# Patient Record
Sex: Female | Born: 1950
Health system: Southern US, Community
[De-identification: ages and names within clinical notes are randomized; demographics above are authoritative.]

## PROBLEM LIST (undated history)

## (undated) DIAGNOSIS — R011 Cardiac murmur, unspecified: Secondary | ICD-10-CM

## (undated) DIAGNOSIS — E079 Disorder of thyroid, unspecified: Secondary | ICD-10-CM

## (undated) DIAGNOSIS — IMO0002 Reserved for concepts with insufficient information to code with codable children: Secondary | ICD-10-CM

## (undated) DIAGNOSIS — E039 Hypothyroidism, unspecified: Secondary | ICD-10-CM

## (undated) DIAGNOSIS — E119 Type 2 diabetes mellitus without complications: Secondary | ICD-10-CM

## (undated) DIAGNOSIS — G709 Myoneural disorder, unspecified: Secondary | ICD-10-CM

## (undated) DIAGNOSIS — F32A Depression, unspecified: Secondary | ICD-10-CM

## (undated) DIAGNOSIS — I10 Essential (primary) hypertension: Secondary | ICD-10-CM

## (undated) DIAGNOSIS — C801 Malignant (primary) neoplasm, unspecified: Secondary | ICD-10-CM

## (undated) DIAGNOSIS — T8859XA Other complications of anesthesia, initial encounter: Secondary | ICD-10-CM

## (undated) DIAGNOSIS — F419 Anxiety disorder, unspecified: Secondary | ICD-10-CM

## (undated) DIAGNOSIS — T7840XA Allergy, unspecified, initial encounter: Secondary | ICD-10-CM

## (undated) DIAGNOSIS — J189 Pneumonia, unspecified organism: Secondary | ICD-10-CM

## (undated) DIAGNOSIS — F329 Major depressive disorder, single episode, unspecified: Secondary | ICD-10-CM

## (undated) HISTORY — PX: NASAL SEPTUM SURGERY: SHX37

## (undated) HISTORY — DX: Anxiety disorder, unspecified: F41.9

## (undated) HISTORY — DX: Disorder of thyroid, unspecified: E07.9

## (undated) HISTORY — DX: Reserved for concepts with insufficient information to code with codable children: IMO0002

## (undated) HISTORY — DX: Major depressive disorder, single episode, unspecified: F32.9

## (undated) HISTORY — DX: Allergy, unspecified, initial encounter: T78.40XA

## (undated) HISTORY — PX: BREAST SURGERY: SHX581

## (undated) HISTORY — PX: CHOLECYSTECTOMY: SHX55

## (undated) HISTORY — DX: Depression, unspecified: F32.A

## (undated) HISTORY — DX: Cardiac murmur, unspecified: R01.1

## (undated) MED FILL — Fosaprepitant Dimeglumine For IV Infusion 150 MG (Base Eq): INTRAVENOUS | Qty: 5 | Status: AC

---

## 1978-01-26 HISTORY — PX: CHOLECYSTECTOMY: SHX55

## 1997-07-09 ENCOUNTER — Emergency Department (HOSPITAL_COMMUNITY): Admission: EM | Admit: 1997-07-09 | Discharge: 1997-07-09 | Payer: Self-pay | Admitting: Emergency Medicine

## 1997-07-17 ENCOUNTER — Emergency Department (HOSPITAL_COMMUNITY): Admission: EM | Admit: 1997-07-17 | Discharge: 1997-07-18 | Payer: Self-pay | Admitting: Internal Medicine

## 1997-07-17 ENCOUNTER — Emergency Department (HOSPITAL_COMMUNITY): Admission: EM | Admit: 1997-07-17 | Discharge: 1997-07-17 | Payer: Self-pay

## 1997-11-13 ENCOUNTER — Emergency Department (HOSPITAL_COMMUNITY): Admission: EM | Admit: 1997-11-13 | Discharge: 1997-11-13 | Payer: Self-pay | Admitting: Emergency Medicine

## 1998-03-14 ENCOUNTER — Emergency Department (HOSPITAL_COMMUNITY): Admission: EM | Admit: 1998-03-14 | Discharge: 1998-03-14 | Payer: Self-pay | Admitting: Emergency Medicine

## 1998-03-20 ENCOUNTER — Ambulatory Visit (HOSPITAL_COMMUNITY): Admission: RE | Admit: 1998-03-20 | Discharge: 1998-03-20 | Payer: Self-pay | Admitting: *Deleted

## 1998-03-20 ENCOUNTER — Encounter: Payer: Self-pay | Admitting: *Deleted

## 1998-04-24 ENCOUNTER — Encounter: Payer: Self-pay | Admitting: *Deleted

## 1998-04-24 ENCOUNTER — Ambulatory Visit (HOSPITAL_COMMUNITY): Admission: RE | Admit: 1998-04-24 | Discharge: 1998-04-24 | Payer: Self-pay | Admitting: *Deleted

## 1998-04-27 ENCOUNTER — Emergency Department (HOSPITAL_COMMUNITY): Admission: EM | Admit: 1998-04-27 | Discharge: 1998-04-27 | Payer: Self-pay | Admitting: Emergency Medicine

## 1998-06-18 ENCOUNTER — Encounter: Payer: Self-pay | Admitting: *Deleted

## 1998-06-18 ENCOUNTER — Ambulatory Visit (HOSPITAL_COMMUNITY): Admission: RE | Admit: 1998-06-18 | Discharge: 1998-06-18 | Payer: Self-pay | Admitting: *Deleted

## 1998-06-27 ENCOUNTER — Ambulatory Visit (HOSPITAL_COMMUNITY): Admission: RE | Admit: 1998-06-27 | Discharge: 1998-06-27 | Payer: Self-pay | Admitting: *Deleted

## 1998-06-27 ENCOUNTER — Encounter: Payer: Self-pay | Admitting: *Deleted

## 1998-07-10 ENCOUNTER — Encounter: Payer: Self-pay | Admitting: *Deleted

## 1998-07-10 ENCOUNTER — Ambulatory Visit (HOSPITAL_COMMUNITY): Admission: RE | Admit: 1998-07-10 | Discharge: 1998-07-10 | Payer: Self-pay | Admitting: *Deleted

## 1998-07-24 ENCOUNTER — Ambulatory Visit (HOSPITAL_COMMUNITY): Admission: RE | Admit: 1998-07-24 | Discharge: 1998-07-24 | Payer: Self-pay | Admitting: *Deleted

## 1998-08-13 ENCOUNTER — Encounter: Payer: Self-pay | Admitting: *Deleted

## 1998-08-13 ENCOUNTER — Ambulatory Visit (HOSPITAL_COMMUNITY): Admission: RE | Admit: 1998-08-13 | Discharge: 1998-08-13 | Payer: Self-pay | Admitting: *Deleted

## 1998-10-09 ENCOUNTER — Encounter: Payer: Self-pay | Admitting: *Deleted

## 1998-10-09 ENCOUNTER — Ambulatory Visit (HOSPITAL_COMMUNITY): Admission: RE | Admit: 1998-10-09 | Discharge: 1998-10-09 | Payer: Self-pay | Admitting: *Deleted

## 1998-11-19 ENCOUNTER — Emergency Department (HOSPITAL_COMMUNITY): Admission: EM | Admit: 1998-11-19 | Discharge: 1998-11-19 | Payer: Self-pay | Admitting: Emergency Medicine

## 1998-11-28 ENCOUNTER — Encounter: Payer: Self-pay | Admitting: *Deleted

## 1998-11-28 ENCOUNTER — Ambulatory Visit (HOSPITAL_COMMUNITY): Admission: RE | Admit: 1998-11-28 | Discharge: 1998-11-28 | Payer: Self-pay | Admitting: *Deleted

## 1999-01-24 ENCOUNTER — Ambulatory Visit (HOSPITAL_COMMUNITY): Admission: RE | Admit: 1999-01-24 | Discharge: 1999-01-24 | Payer: Self-pay | Admitting: *Deleted

## 1999-01-24 ENCOUNTER — Encounter: Payer: Self-pay | Admitting: *Deleted

## 1999-03-14 ENCOUNTER — Encounter: Payer: Self-pay | Admitting: Emergency Medicine

## 1999-03-14 ENCOUNTER — Emergency Department (HOSPITAL_COMMUNITY): Admission: EM | Admit: 1999-03-14 | Discharge: 1999-03-14 | Payer: Self-pay | Admitting: Emergency Medicine

## 1999-04-16 ENCOUNTER — Encounter: Payer: Self-pay | Admitting: *Deleted

## 1999-04-16 ENCOUNTER — Ambulatory Visit (HOSPITAL_COMMUNITY): Admission: RE | Admit: 1999-04-16 | Discharge: 1999-04-16 | Payer: Self-pay | Admitting: *Deleted

## 1999-06-27 ENCOUNTER — Ambulatory Visit (HOSPITAL_COMMUNITY): Admission: RE | Admit: 1999-06-27 | Discharge: 1999-06-27 | Payer: Self-pay | Admitting: *Deleted

## 1999-06-27 ENCOUNTER — Encounter: Payer: Self-pay | Admitting: *Deleted

## 1999-09-10 ENCOUNTER — Ambulatory Visit (HOSPITAL_COMMUNITY): Admission: RE | Admit: 1999-09-10 | Discharge: 1999-09-10 | Payer: Self-pay | Admitting: *Deleted

## 1999-09-10 ENCOUNTER — Encounter: Payer: Self-pay | Admitting: *Deleted

## 2000-04-22 ENCOUNTER — Ambulatory Visit (HOSPITAL_COMMUNITY): Admission: RE | Admit: 2000-04-22 | Discharge: 2000-04-22 | Payer: Self-pay | Admitting: *Deleted

## 2000-04-22 ENCOUNTER — Encounter: Payer: Self-pay | Admitting: *Deleted

## 2000-12-03 ENCOUNTER — Encounter: Admission: RE | Admit: 2000-12-03 | Discharge: 2000-12-03 | Payer: Self-pay | Admitting: Internal Medicine

## 2000-12-03 ENCOUNTER — Encounter: Payer: Self-pay | Admitting: Internal Medicine

## 2001-12-24 ENCOUNTER — Emergency Department (HOSPITAL_COMMUNITY): Admission: EM | Admit: 2001-12-24 | Discharge: 2001-12-24 | Payer: Self-pay

## 2002-12-14 ENCOUNTER — Ambulatory Visit (HOSPITAL_COMMUNITY): Admission: RE | Admit: 2002-12-14 | Discharge: 2002-12-14 | Payer: Self-pay | Admitting: *Deleted

## 2002-12-17 ENCOUNTER — Ambulatory Visit (HOSPITAL_COMMUNITY): Admission: RE | Admit: 2002-12-17 | Discharge: 2002-12-17 | Payer: Self-pay | Admitting: *Deleted

## 2005-08-13 ENCOUNTER — Emergency Department (HOSPITAL_COMMUNITY): Admission: EM | Admit: 2005-08-13 | Discharge: 2005-08-13 | Payer: Self-pay | Admitting: Emergency Medicine

## 2007-01-09 ENCOUNTER — Emergency Department (HOSPITAL_COMMUNITY): Admission: EM | Admit: 2007-01-09 | Discharge: 2007-01-09 | Payer: Self-pay | Admitting: Emergency Medicine

## 2007-02-02 ENCOUNTER — Ambulatory Visit: Payer: Self-pay | Admitting: Internal Medicine

## 2007-03-01 ENCOUNTER — Ambulatory Visit: Payer: Self-pay | Admitting: Internal Medicine

## 2007-03-01 ENCOUNTER — Encounter: Payer: Self-pay | Admitting: Internal Medicine

## 2008-07-20 ENCOUNTER — Emergency Department (HOSPITAL_BASED_OUTPATIENT_CLINIC_OR_DEPARTMENT_OTHER): Admission: EM | Admit: 2008-07-20 | Discharge: 2008-07-20 | Payer: Self-pay | Admitting: Emergency Medicine

## 2008-07-20 ENCOUNTER — Ambulatory Visit: Payer: Self-pay | Admitting: Diagnostic Radiology

## 2008-10-20 ENCOUNTER — Emergency Department (HOSPITAL_COMMUNITY): Admission: EM | Admit: 2008-10-20 | Discharge: 2008-10-20 | Payer: Self-pay | Admitting: Emergency Medicine

## 2009-07-15 ENCOUNTER — Ambulatory Visit (HOSPITAL_COMMUNITY): Admission: RE | Admit: 2009-07-15 | Discharge: 2009-07-15 | Payer: Self-pay | Admitting: Psychiatry

## 2010-02-25 ENCOUNTER — Encounter: Payer: Self-pay | Admitting: Internal Medicine

## 2010-03-05 NOTE — Letter (Signed)
Summary: Colonoscopy Letter  [facility]  [address] [place], [place] [address]   Phone: [phone]  Fax: [phone]      February 25, 2010 MRN: [id number]   [facility] [address] [place], [place]  [address]   Dear Jasmine Long,   According to your medical record, it is time for you to schedule a Colonoscopy. The American Cancer Society recommends this procedure as a method to detect early colon cancer. Patients with a family history of colon cancer, or a personal history of colon polyps or inflammatory bowel disease are at increased risk.  This letter has been generated based on the recommendations made at the time of your procedure. If you feel that in your particular situation this may no longer apply, please contact our office.  Please call our office at [phone] to schedule this appointment or to update your records at your earliest convenience.  Thank you for cooperating with [place] to provide you with the very best care possible.   Sincerely,  Wilhemina Bonito. Marina Goodell, M.D.  [facility] [phone]

## 2010-05-02 LAB — POCT CARDIAC MARKERS
CKMB, poc: <1 ng/mL — ABNORMAL LOW (ref 1.0–8.0)
Myoglobin, poc: 49.5 ng/mL (ref 12–200)
Troponin i, poc: <0.05 ng/mL (ref 0.00–0.09)

## 2010-05-02 LAB — POCT I-STAT, CHEM 8
BUN: 16 mg/dL (ref 6–23)
Calcium, Ion: 1.15 mmol/L (ref 1.12–1.32)
Chloride: 106 mEq/L (ref 96–112)
Creatinine, Ser: 0.8 mg/dL (ref 0.4–1.2)
Glucose, Bld: 138 mg/dL — ABNORMAL HIGH (ref 70–99)
HCT: 45 % (ref 36.0–46.0)
Hemoglobin: 15.3 g/dL — ABNORMAL HIGH (ref 12.0–15.0)
Potassium: 4 mEq/L (ref 3.5–5.1)
Sodium: 139 mEq/L (ref 135–145)
TCO2: 24 mmol/L (ref 0–100)

## 2010-05-02 LAB — D-DIMER, QUANTITATIVE: D-Dimer, Quant: 0.97 ug/mL-FEU — ABNORMAL HIGH (ref 0.00–0.48)

## 2010-05-02 LAB — CK TOTAL AND CKMB (NOT AT ARMC)
CK, MB: 1.2 ng/mL (ref 0.3–4.0)
Relative Index: INVALID (ref 0.0–2.5)
Total CK: 60 U/L (ref 7–177)

## 2010-05-02 LAB — TROPONIN I: Troponin I: 0.01 ng/mL (ref 0.00–0.06)

## 2010-05-05 LAB — URINALYSIS, ROUTINE W REFLEX MICROSCOPIC
Bilirubin Urine: NEGATIVE
Glucose, UA: NEGATIVE mg/dL
Ketones, ur: NEGATIVE mg/dL
Leukocytes, UA: NEGATIVE
Nitrite: NEGATIVE
Protein, ur: NEGATIVE mg/dL
Specific Gravity, Urine: 1.004 — ABNORMAL LOW (ref 1.005–1.030)
Urobilinogen, UA: 0.2 mg/dL (ref 0.0–1.0)
pH: 6 (ref 5.0–8.0)

## 2010-05-05 LAB — DIFFERENTIAL
Basophils Absolute: 0.2 10*3/uL — ABNORMAL HIGH (ref 0.0–0.1)
Basophils Relative: 2 % — ABNORMAL HIGH (ref 0–1)
Eosinophils Absolute: 0.1 10*3/uL (ref 0.0–0.7)
Eosinophils Relative: 1 % (ref 0–5)
Lymphocytes Relative: 23 % (ref 12–46)
Lymphs Abs: 2.7 10*3/uL (ref 0.7–4.0)
Monocytes Absolute: 0.6 10*3/uL (ref 0.1–1.0)
Monocytes Relative: 5 % (ref 3–12)
Neutro Abs: 7.9 10*3/uL — ABNORMAL HIGH (ref 1.7–7.7)
Neutrophils Relative %: 69 % (ref 43–77)

## 2010-05-05 LAB — COMPREHENSIVE METABOLIC PANEL
ALT: 16 U/L (ref 0–35)
AST: 24 U/L (ref 0–37)
Albumin: 4.5 g/dL (ref 3.5–5.2)
Alkaline Phosphatase: 110 U/L (ref 39–117)
BUN: 12 mg/dL (ref 6–23)
CO2: 25 mEq/L (ref 19–32)
Calcium: 9.4 mg/dL (ref 8.4–10.5)
Chloride: 107 mEq/L (ref 96–112)
Creatinine, Ser: 0.7 mg/dL (ref 0.4–1.2)
GFR calc Af Amer: >60 mL/min (ref >60)
GFR calc non Af Amer: >60 mL/min (ref >60)
Glucose, Bld: 103 mg/dL — ABNORMAL HIGH (ref 70–99)
Potassium: 4.1 mEq/L (ref 3.5–5.1)
Sodium: 141 mEq/L (ref 135–145)
Total Bilirubin: 0.5 mg/dL (ref 0.3–1.2)
Total Protein: 8.2 g/dL (ref 6.0–8.3)

## 2010-05-05 LAB — CBC
HCT: 45.1 % (ref 36.0–46.0)
Hemoglobin: 15.5 g/dL — ABNORMAL HIGH (ref 12.0–15.0)
MCHC: 34.3 g/dL (ref 30.0–36.0)
MCV: 90.4 fL (ref 78.0–100.0)
Platelets: 315 10*3/uL (ref 150–400)
RBC: 4.99 MIL/uL (ref 3.87–5.11)
RDW: 13.5 % (ref 11.5–15.5)
WBC: 11.5 10*3/uL — ABNORMAL HIGH (ref 4.0–10.5)

## 2010-05-05 LAB — URINE MICROSCOPIC-ADD ON

## 2010-05-05 LAB — LIPASE, BLOOD: Lipase: 82 U/L (ref 23–300)

## 2010-06-10 NOTE — Assessment & Plan Note (Signed)
HEALTHCARE                         GASTROENTEROLOGY OFFICE NOTE   NAME:[name], [name]                           MRN:          [id number]  DATE:02/02/2007                            DOB:          Mar 12, 1950    REFERRING PHYSICIAN:  Harrel Lemon. Merla Riches, M.D.   REASON FOR CONSULTATION:  Abdominal pain and Hemoccult positive stool.   HISTORY:  This is a 60 year old white female with a history of  hypothyroidism, obesity and chronic tobacco abuse.  She is referred  through the courtesy of Dr. Merla Riches regarding abdominal pain. And  Hemoccult positive stool.  Patient was evaluated in this office on one  occasion in November 2002 to evaluate right upper quadrant pain and  occasional diarrhea.  See that dictation for details.  Patient was to  have undergone colonoscopy and upper endoscopy.  She was scheduled for  those procedures in December 2002, however, she failed to keep her  appointment stating that she forgot.  She requested to be rescheduled in  mid January.  She was rescheduled mid January 2003, however, she was a  no-show for those procedures.  At that time, she stated the problem had  to do with insurance.  She has not been seen since.  The current history  is that of epigastric and upper abdominal pain in mid December.  She  went to the emergency room and underwent an extensive work-up including  laboratories and CT scan.  No acute findings found.  She was given a GI  cocktail and stated her symptoms improved.  She subsequently went home  and developed problems with nausea and vomiting.  Some recurrent pain.  She was seen at Dr. Netta Corrigan office and found to be Hemoccult  positive.  She was placed on Protonix and Carafate.  She currently  states she is feeling better though she is having some residual  discomfort.  No nausea or vomiting at this time.  At no point was there  melena or hematochezia.  She has had no problems with appetite and has  had no weight loss.  She thinks she had an ulcer back in college.  She  continues to smoke.   PAST MEDICAL HISTORY:  1. Hypothyroidism.  2. Anxiety.  3. History of heart murmur.  4. Status post cholecystectomy in 1986.  5. Status post cyst removal from breast in 1969.  6. Status post tonsillectomy as a child.  7. Status post cesarean section x2.   ALLERGIES:  NO KNOWN DRUG ALLERGIES.   CURRENT MEDICATIONS:  1. Synthroid 0.125 mg every other day.  2. Pantoprazole 40 mg daily.  3. Carafate 1 g q.i.d.   FAMILY HISTORY:  Several second cousins on her father's side with colon  cancer.  Mother with lung cancer.  Father with prostate cancer.   SOCIAL HISTORY:  Patient is divorced with two sons.  She lives alone.  She has a BA in sociology.  She is a Production designer, theatre/television/film at [employer].  She smokes a pack of cigarettes per day.  Does not use  alcohol.   REVIEW  OF SYSTEMS:  Per diagnostic evaluation form.   PHYSICAL EXAMINATION:  VITAL SIGNS:  Blood pressure 150/90, heart rate  88.  GENERAL APPEARANCE:  Patient is alert and oriented.  She is in no acute  distress.  HEENT:  Sclerae are anicteric.  Conjunctivae are pink.  Oral mucosa is  intact.  No adenopathy.  LUNGS:  Clear.  CARDIOVASCULAR:  Regular.  ABDOMEN:  Obese and soft with complaints of tenderness to palpation in  the epigastric region.  Good bowel sounds heard.  No mass felt.  EXTREMITIES:  Without edema.   IMPRESSION:  1. Upper abdominal pain.  Seemingly improved on proton pump inhibitor      therapy.  Rule out acid peptic disorders.  Rule out transient viral      syndrome.  2. Hemoccult positive stool.  3. Questionable past history of peptic ulcer disease.  4. Family history of colon cancer in second degree relatives.   RECOMMENDATIONS:  1. Colonoscopy with polypectomy if necessary.  This to evaluate      Hemoccult positive stool as well as provide colorectal neoplasia      screening.  The nature of the  procedure as well as the risks,      benefits, and alternatives have been reviewed.  She understood and      agreed to proceed.  2. Upper endoscopy.  This to evaluate upper abdominal pain, Hemoccult      positive stool and a questionable history of ulcer disease.  Biopsy      should be taken for Helicobacter pylori.  Ashby Dawes of the procedure      as well as the risks, benefits, and alternatives have been      reviewed.  She understood and agreed to proceed.  3. Continue proton pump inhibitor therapy for time being.     Wilhemina Bonito. Marina Goodell, MD  Electronically Signed    JNP/MedQ  DD: 02/02/2007  DT: 02/02/2007  Job #: [id number]   cc:   Harrel Lemon. Merla Riches, M.D.

## 2010-06-10 NOTE — Assessment & Plan Note (Signed)
[facility] HEALTHCARE                                 ON-CALL NOTE   NAME:[name], Jasmine                             MRN:          [id number]  DATE:03/01/2007                            DOB:          Jun 12, 1950    Jasmine [name] is a 60 year old patient of Dr. Marina Goodell, who underwent  colonoscopy today. She returned home around 5 p.m. and called me around  10:30 p.m., reporting three blood clots per rectum. Pt ate supper after  returning from her procedure and has been feeling fine. She  denies any  abdominal pain, dizziness, nausea or vomiting.  It appears that the  blood clots were just isolated blood clots.  There were  actually no  bloody stools.  It was my recommendation to Jasmine [name] that we continue to  observe for further bleeding tonight.  If she continues to have bloody  stools, she is to call me back; I would meet her in the emergency room.     Hedwig Morton. Juanda Chance, MD  Electronically Signed    DMB/MedQ  DD: 03/01/2007  DT: 03/02/2007  Job #: [id number]   cc:   Wilhemina Bonito. Marina Goodell, MD

## 2010-06-13 NOTE — Op Note (Signed)
[name], [name]                              ACCOUNT NO.:  [id number]   MEDICAL RECORD NO.:  [id number]                   PATIENT TYPE:  OUT   LOCATION:  XRAY                                 FACILITY:  [facility]   PHYSICIAN:  Ricky D. Gasper Sells, M.D.             DATE OF BIRTH:  24-Sep-1950   DATE OF PROCEDURE:  12/14/2002  DATE OF DISCHARGE:                                 OPERATIVE REPORT   PROCEDURE:  Fluoroscopic flexion and extension view under fluoroscopic  control.   SURGEON:  Ricky D. Gasper Sells, M.D.   COMPLICATIONS:  Nil.   SUMMARY OF PROCEDURE:  With the patient in the lateral, pure lateral, right  oblique, and left oblique positions, the patient flexed and extended her  neck under fluoroscopic control.  No abnormal motion was identified.                                               Ricky D. Gasper Sells, M.D.    RDH/MEDQ  D:  12/15/2002  T:  12/15/2002  Job:  [id number]

## 2010-11-03 LAB — CBC
HCT: 42.6
Hemoglobin: 14.8
MCHC: 34.7
MCV: 88.3
Platelets: 340
RBC: 4.83
RDW: 14.2
WBC: 12.6 — ABNORMAL HIGH

## 2010-11-03 LAB — BASIC METABOLIC PANEL
BUN: 11
CO2: 27
Calcium: 9.6
Chloride: 105
Creatinine, Ser: 0.77
GFR calc Af Amer: >60
GFR calc non Af Amer: >60
Glucose, Bld: 108 — ABNORMAL HIGH
Potassium: 3.8
Sodium: 140

## 2010-11-03 LAB — URINALYSIS, ROUTINE W REFLEX MICROSCOPIC
Bilirubin Urine: NEGATIVE
Glucose, UA: NEGATIVE
Hgb urine dipstick: NEGATIVE
Ketones, ur: NEGATIVE
Nitrite: NEGATIVE
Protein, ur: NEGATIVE
Specific Gravity, Urine: 1.004 — ABNORMAL LOW
Urobilinogen, UA: 0.2
pH: 7

## 2010-11-03 LAB — DIFFERENTIAL
Basophils Absolute: 0.1
Basophils Relative: 1
Eosinophils Absolute: 0.1 — ABNORMAL LOW
Eosinophils Relative: 1
Lymphocytes Relative: 28
Lymphs Abs: 3.5
Monocytes Absolute: 1
Monocytes Relative: 8
Neutro Abs: 8 — ABNORMAL HIGH
Neutrophils Relative %: 63

## 2010-11-03 LAB — POCT CARDIAC MARKERS
CKMB, poc: 1.7
Myoglobin, poc: 64.7
Operator id: 2725
Troponin i, poc: <0.05

## 2011-03-30 ENCOUNTER — Telehealth: Payer: Self-pay

## 2011-03-30 NOTE — Telephone Encounter (Signed)
Pt needs to RTC, cannot RF without OV.

## 2011-03-30 NOTE — Telephone Encounter (Signed)
Patient has not been seen since 12/2009... Ok to deny?

## 2011-03-30 NOTE — Telephone Encounter (Signed)
Pt would like to have a refill on alprazolam, pt states that she may need an ov but she is unable to come in until after her insurance is effective which is April 21, 2011. Pharmacy: [facility].

## 2011-03-31 NOTE — Telephone Encounter (Signed)
Spoke with pt and would like Dr. Merla Riches to review. She has app on 04-29-11 at 2:15 and that was the only next appt

## 2011-04-01 MED ORDER — ALPRAZOLAM 0.5 MG PO TABS: 0.5000 mg | ORAL_TABLET | Freq: Three times a day (TID) | ORAL | Status: DC | PRN

## 2011-04-01 NOTE — Telephone Encounter (Signed)
Have ms Haertel set an appt with a PA on Wed at 104 and she can get restarted sooner-she may be able to get in today

## 2011-04-01 NOTE — Telephone Encounter (Signed)
Spoke with pt--she doesn't have insurance until March 26th and she has an appt with you on April 3rd. She wants a few pills to get her through to her appt in case she goes into a panic attack. I told her I was not sure if we could do this, but she insisted that I ask you. Please advise.

## 2011-04-02 NOTE — Telephone Encounter (Signed)
LMOM THAT RX HAS BEEN FAXED IN

## 2011-04-02 NOTE — Telephone Encounter (Signed)
Finally put at tl desk11am

## 2011-04-09 ENCOUNTER — Other Ambulatory Visit: Payer: Self-pay | Admitting: Family Medicine

## 2011-04-09 MED ORDER — ALPRAZOLAM 0.5 MG PO TABS: 0.5000 mg | ORAL_TABLET | Freq: Three times a day (TID) | ORAL | Status: AC | PRN

## 2011-04-29 ENCOUNTER — Ambulatory Visit: Payer: Self-pay | Admitting: Internal Medicine

## 2011-04-29 ENCOUNTER — Encounter: Payer: Self-pay | Admitting: Internal Medicine

## 2011-04-29 VITALS — BP 159/92 | HR 82 | Temp 98.0°F | Resp 16 | Ht 68.0 in | Wt 268.2 lb

## 2011-04-29 DIAGNOSIS — F4323 Adjustment disorder with mixed anxiety and depressed mood: Secondary | ICD-10-CM | POA: Insufficient documentation

## 2011-04-29 DIAGNOSIS — F418 Other specified anxiety disorders: Secondary | ICD-10-CM | POA: Insufficient documentation

## 2011-04-29 DIAGNOSIS — Z6841 Body Mass Index (BMI) 40.0 and over, adult: Secondary | ICD-10-CM | POA: Insufficient documentation

## 2011-04-29 DIAGNOSIS — E1159 Type 2 diabetes mellitus with other circulatory complications: Secondary | ICD-10-CM | POA: Insufficient documentation

## 2011-04-29 DIAGNOSIS — I10 Essential (primary) hypertension: Secondary | ICD-10-CM | POA: Insufficient documentation

## 2011-04-29 DIAGNOSIS — K219 Gastro-esophageal reflux disease without esophagitis: Secondary | ICD-10-CM

## 2011-04-29 DIAGNOSIS — R011 Cardiac murmur, unspecified: Secondary | ICD-10-CM | POA: Insufficient documentation

## 2011-04-29 DIAGNOSIS — K5730 Diverticulosis of large intestine without perforation or abscess without bleeding: Secondary | ICD-10-CM | POA: Insufficient documentation

## 2011-04-29 DIAGNOSIS — F341 Dysthymic disorder: Secondary | ICD-10-CM

## 2011-04-29 DIAGNOSIS — F17200 Nicotine dependence, unspecified, uncomplicated: Secondary | ICD-10-CM | POA: Insufficient documentation

## 2011-04-29 DIAGNOSIS — E039 Hypothyroidism, unspecified: Secondary | ICD-10-CM | POA: Insufficient documentation

## 2011-04-29 MED ORDER — BUPROPION HCL ER (XL) 300 MG PO TB24: 300.0000 mg | ORAL_TABLET | Freq: Every day | ORAL | Status: DC

## 2011-04-29 MED ORDER — CLONAZEPAM 1 MG PO TABS: 1.0000 mg | ORAL_TABLET | Freq: Three times a day (TID) | ORAL | Status: DC | PRN

## 2011-04-29 MED ORDER — LOSARTAN POTASSIUM 50 MG PO TABS: 50.0000 mg | ORAL_TABLET | Freq: Every day | ORAL | Status: DC

## 2011-04-29 MED ORDER — LEVOTHYROXINE SODIUM 125 MCG PO TABS: 125.0000 ug | ORAL_TABLET | Freq: Every day | ORAL | Status: DC

## 2011-04-29 NOTE — Progress Notes (Signed)
Subjective:    Patient ID: Jasmine Long, female    DOB: 09-Apr-1950, 61 y.o.   MRN: [id number]  HPI Patient Active Problem List  Diagnoses  . Hypothyroid  . BMI 40.0-44.9, adult  . Depression with anxiety  . GERD (gastroesophageal reflux disease)  . Diverticula of colon  . Nicotine addiction  . Hypertension  . Murmur, cardiac  Back from [place] for family issues Searching for a job Still devastated by the murder of her older son Her younger son who we sent to [place], has done well and is now in counseling with Caralyn Guile He may reapply to law school down here  She still has depression affecting her daily life with occasional need for daytime anti-anxiety medicine. She also has sleep difficulties despite 1 mg of Klonopin but not on an every night basis. Her medical problems seem to be stable except for her blood pressure. She stopped taking the medicine about 2 or 3 weeks ago and can already  feel a difference.    Review of Systems     Objective:   Physical Exam Vital signs the blood pressure is 159/92 and BMI of 40 PERRLA EOMs conj No thyromegaly or nodules No carotid bruits Grade 2 systolic ejection murmur at the base of the heart Lungs clear Affect flat but appropriate for situation       Assessment & Plan:   Patient Active Problem List  Diagnoses  . Hypothyroid  . BMI 40.0-44.9, adult  . Depression with anxiety  . GERD (gastroesophageal reflux disease)  . Diverticula of colon  . Nicotine addiction  . Hypertension  . Murmur, cardiac   Plan-restart losartan 50 mg #90 with 3 refills Continue Levora thyroxine 125 mcg Continue Wellbutrin 300 XL Consolidate benzodiazepines/a for Klonopin 1 mg #90 to take half to one 3 times a day when necessary refill x5 2 followup in 6 months for thyroid testing and metabolic profile plus lipids Recommended counseling She's not ready to address smoking cessation At some point we need to have a cardiac echo

## 2011-05-04 ENCOUNTER — Telehealth: Payer: Self-pay

## 2011-05-04 NOTE — Telephone Encounter (Signed)
Please call her/as a cause Klonopin and Xanax are the same medication except for the length of duration we tried to consolidate these 2 medicines into just Clonopin at her last office visit. Call the patient verify what she would like to do with these medications so I can understand better what to do

## 2011-05-04 NOTE — Telephone Encounter (Signed)
SARAH FROM [facility] REQUESTING A REFILL ON PT'S ALPRAZOLAM. PLEASE CALL [phone] AND THE FAX IS [phone]

## 2011-05-04 NOTE — Telephone Encounter (Signed)
Dr. Doolittle please review. 

## 2011-05-06 NOTE — Telephone Encounter (Signed)
Pt states that she takes the clonazepam at night to sleep longer and the alprazolam during the day if needed.

## 2011-05-10 NOTE — Telephone Encounter (Signed)
Could call in alprazolam 0.5 mg #60 one twice a day as needed

## 2011-05-11 ENCOUNTER — Other Ambulatory Visit: Payer: Self-pay | Admitting: Physician Assistant

## 2011-05-11 MED ORDER — ALPRAZOLAM 0.5 MG PO TBDP: 0.5000 mg | ORAL_TABLET | Freq: Two times a day (BID) | ORAL | Status: DC | PRN

## 2011-05-11 MED ORDER — ALPRAZOLAM 0.5 MG PO TABS: 0.5000 mg | ORAL_TABLET | Freq: Two times a day (BID) | ORAL | Status: AC | PRN

## 2011-05-11 NOTE — Telephone Encounter (Signed)
Called in Rx to [facility] and notified pt

## 2011-08-11 ENCOUNTER — Other Ambulatory Visit: Payer: Self-pay | Admitting: *Deleted

## 2011-08-11 MED ORDER — ALPRAZOLAM 0.5 MG PO TABS: 0.5000 mg | ORAL_TABLET | Freq: Two times a day (BID) | ORAL | Status: AC | PRN

## 2011-10-15 ENCOUNTER — Other Ambulatory Visit: Payer: Self-pay | Admitting: Orthopedic Surgery

## 2011-10-15 DIAGNOSIS — R52 Pain, unspecified: Secondary | ICD-10-CM

## 2011-10-16 ENCOUNTER — Ambulatory Visit (INDEPENDENT_AMBULATORY_CARE_PROVIDER_SITE_OTHER): Payer: Worker's Compensation

## 2011-10-16 DIAGNOSIS — X58XXXA Exposure to other specified factors, initial encounter: Secondary | ICD-10-CM

## 2011-10-16 DIAGNOSIS — R52 Pain, unspecified: Secondary | ICD-10-CM

## 2011-10-16 DIAGNOSIS — M79609 Pain in unspecified limb: Secondary | ICD-10-CM

## 2011-10-22 ENCOUNTER — Encounter: Payer: Self-pay | Admitting: Internal Medicine

## 2011-11-21 ENCOUNTER — Other Ambulatory Visit: Payer: Self-pay

## 2011-11-21 MED ORDER — ALPRAZOLAM 0.5 MG PO TABS: 0.5000 mg | ORAL_TABLET | Freq: Every evening | ORAL | Status: DC | PRN

## 2011-11-21 MED ORDER — CLONAZEPAM 1 MG PO TABS: 1.0000 mg | ORAL_TABLET | Freq: Three times a day (TID) | ORAL | Status: DC | PRN

## 2011-12-02 ENCOUNTER — Other Ambulatory Visit: Payer: Self-pay | Admitting: Radiology

## 2011-12-09 ENCOUNTER — Ambulatory Visit (INDEPENDENT_AMBULATORY_CARE_PROVIDER_SITE_OTHER): Payer: 59 | Admitting: Family Medicine

## 2011-12-09 VITALS — BP 135/85 | HR 89 | Temp 98.5°F | Resp 16 | Ht 67.5 in | Wt 275.0 lb

## 2011-12-09 DIAGNOSIS — R42 Dizziness and giddiness: Secondary | ICD-10-CM

## 2011-12-09 NOTE — Patient Instructions (Addendum)
Let me know if you are not better in the next couple of days.  If you get worse, or if you develop any headaches or vomiting please get help right away.

## 2011-12-09 NOTE — Progress Notes (Signed)
[facility] [address], [place] [address] [phone]  Date:  12/09/2011   Name:  Jasmine Long   DOB:  January 04, 1951   MRN:  [id number]  PCP:  Tonye Pearson, MD    Chief Complaint: Dizziness   History of Present Illness:  Jasmine Long is a 61 y.o. very pleasant female patient who presents with the following:  Regular patient of Dr. Merla Riches.  Has suffered a lot in her life- one of her sons was murdered in 2011, another suffers from mental illness.   She was at work today and had an attack of vertigo.  She has a history of HTN and thought this could be the cause.  She admits that she does not take her BP medication every day. The vertigo occurred around 1:30 pm.  It lasted off and on for about an hour.  Her symptoms are now just minimal.   She had vertigo about a year ago.  She recalls her symptoms as being similar to today.  She was treated with meclizine and still has some at home  No headache, no trouble with her vision or hearing.  No ringing or buzzing in her ears.   She had "a little bit of panic, anxiety" on the way over here which she attributes to anxiety.  This seemed to cause some CP for just a few seconds.  Her CP is now resolved.   She has a history of a systolic heart murmur and HTN but no other cardiac problms  Patient Active Problem List  Diagnosis  . Hypothyroid  . BMI 40.0-44.9, adult  . Depression with anxiety  . GERD (gastroesophageal reflux disease)  . Diverticula of colon  . Nicotine addiction  . Hypertension  . Murmur, cardiac    Past Medical History  Diagnosis Date  . Allergy   . Depression   . Ulcer   . Thyroid disease   . Anxiety   . Heart murmur     Past Surgical History  Procedure Date  . Cholecystectomy   . Cesarean section     History  Substance Use Topics  . Smoking status: Current Every Day Smoker -- 1.5 packs/day for 40 years    Types: Cigarettes  . Smokeless tobacco: Not on file  . Alcohol Use: No      No family history on file.  No Known Allergies  Medication list has been reviewed and updated.  Current Outpatient Prescriptions on File Prior to Visit  Medication Sig Dispense Refill  . ALPRAZolam (XANAX) 0.5 MG tablet Take 1 tablet (0.5 mg total) by mouth at bedtime as needed for sleep.  60 tablet  0  . buPROPion (WELLBUTRIN XL) 300 MG 24 hr tablet Take 1 tablet (300 mg total) by mouth daily.  30 tablet  11  . clonazePAM (KLONOPIN) 1 MG tablet Take 1 tablet (1 mg total) by mouth 3 (three) times daily as needed (1/2 to 1 tid prn).  90 tablet  0  . levothyroxine (SYNTHROID, LEVOTHROID) 125 MCG tablet Take 1 tablet (125 mcg total) by mouth daily.  90 tablet  3  . losartan (COZAAR) 50 MG tablet Take 1 tablet (50 mg total) by mouth daily.  90 tablet  3    Review of Systems:  As per HPI- otherwise negative. No HA, no head trauma, no nausea or vomiting  Physical Examination: Filed Vitals:   12/09/11 1414  BP: 135/85  Pulse: 89  Temp: 98.5 F (  36.9 C)  Resp: 16   Filed Vitals:   12/09/11 1414  Height: 5' 7.5" (1.715 m)  Weight: 275 lb (124.739 kg)   Body mass index is 42.44 kg/(m^2). Ideal Body Weight: Weight in (lb) to have BMI = 25: 161.7   GEN: WDWN, NAD, Non-toxic, A & O x 3, obese HEENT: Atraumatic, Normocephalic. Neck supple. No masses, No LAD. Bilateral TM wnl, oropharynx normal.  PEERL,EOMI.   Ears and Nose: No external deformity. CV: RRR, No M/G/R. No JVD. No thrill. No extra heart sounds. PULM: CTA B, no wheezes, crackles, rhonchi. No retractions. No resp. distress. No accessory muscle use. ABD: S, NT, ND, +BS. No rebound. No HSM. EXTR: No c/c/e NEURO Normal gait. Normal strength and sensation, normal DTR all extremities. Negative arm drift, normal balance.  Positive Dix Hallpike sign.   PSYCH: Normally interactive. Conversant. Not depressed or anxious appearing.  Calm demeanor.   EKG; NSR, no ST elevation or depression.  Compared to old EKG and there is no  change.    Discussed doing BW but Maybellene is a very hard stick and she declined to do labs today.     Assessment and Plan: 1. Vertigo  EKG 12-Lead   Jonda likely has BPPV.  She has some meclizine at home left over.  Went over cautions regarding sedation and mixing with her other medications.  She will take it easy and not return to work today.  If her symptoms continue   COPLAND,JESSICA, MD

## 2012-01-27 ENCOUNTER — Ambulatory Visit (INDEPENDENT_AMBULATORY_CARE_PROVIDER_SITE_OTHER): Payer: 59 | Admitting: Internal Medicine

## 2012-01-27 VITALS — BP 131/85 | HR 106 | Temp 98.2°F | Resp 20 | Ht 67.5 in | Wt 270.0 lb

## 2012-01-27 DIAGNOSIS — F17200 Nicotine dependence, unspecified, uncomplicated: Secondary | ICD-10-CM

## 2012-01-27 DIAGNOSIS — H029 Unspecified disorder of eyelid: Secondary | ICD-10-CM

## 2012-01-27 DIAGNOSIS — F410 Panic disorder [episodic paroxysmal anxiety] without agoraphobia: Secondary | ICD-10-CM

## 2012-01-27 DIAGNOSIS — F418 Other specified anxiety disorders: Secondary | ICD-10-CM

## 2012-01-27 MED ORDER — CLONAZEPAM 1 MG PO TABS: 1.0000 mg | ORAL_TABLET | Freq: Three times a day (TID) | ORAL | Status: DC | PRN

## 2012-01-27 MED ORDER — ALPRAZOLAM 0.5 MG PO TABS: ORAL_TABLET | ORAL | Status: DC

## 2012-01-27 NOTE — Progress Notes (Signed)
Subjective:    Patient ID: Jasmine Long, female    DOB: 1950-10-24, 62 y.o.   MRN: [id number]  HPI Increased anxiety over the holidays(se hx son's murder) resulted in needing full doses of all her medications and still having enough pain that is creating gastrointestinal symptoms including nausea and vomiting New job managing [employer] Has set up follow up appointment with psychology  Unhappy with her job Review of Systems Is transferred to electronic cigarette   hypothyroidism stable Reflux is stable No chest pain or palpitations No cough No weight loss or night sweats No fatigue Objective:   Physical Exam Vital signs stable except overweight 270 pounds Heart regular at 92 Neurological intact Mood anxious/affect appropriate R upper lid w/ lesion-warty growth      Assessment & Plan:   1. Eyelid lesion  Ambulatory referral to Ophthalmology  2. Panic attack    3. Depression with anxiety    4. Nicotine addiction     Followup with Dr. Laymond Purser Meds ordered this encounter  Medications  . clonazePAM (KLONOPIN) 1 MG tablet    Sig: Take 1 tablet (1 mg total) by mouth 3 (three) times daily as needed (1/2 to 1 tid prn).    Dispense:  90 tablet    Refill:  5  . ALPRAZolam (XANAX) 0.5 MG tablet    Sig: One to two at bedtime prn    Dispense:  60 tablet    Refill:  5   Continue Wellbutrin and other medications Followup 6 months Referral ophthalmology

## 2012-01-27 NOTE — Progress Notes (Signed)
Subjective:    Patient ID: Jasmine Long, female    DOB: [birth year], 62 y.o.   MRN: [id number]  HPI Patient here with complaints of panic attacks for past 1 week. Has taken Clonazepam and Xanax with out much relief. States she has been taking increased amounts of this for past week. She needs a work note for today because she was not able to work today. Patient also has an area on her right upper eyelid she is concerned about.    Review of Systems     Objective:   Physical Exam Patient has small wart on her upper right eyelid.        Assessment & Plan:  Needs referral to opthamology for her right eyelid wart.   I have reviewed and agree with documentation. Robert P. Merla Riches, M.D.

## 2012-01-28 ENCOUNTER — Ambulatory Visit (INDEPENDENT_AMBULATORY_CARE_PROVIDER_SITE_OTHER): Payer: 59 | Admitting: Psychology

## 2012-01-28 DIAGNOSIS — F4323 Adjustment disorder with mixed anxiety and depressed mood: Secondary | ICD-10-CM

## 2012-02-03 ENCOUNTER — Other Ambulatory Visit: Payer: Self-pay | Admitting: Ophthalmology

## 2012-02-18 ENCOUNTER — Emergency Department (HOSPITAL_COMMUNITY): Payer: 59

## 2012-02-18 ENCOUNTER — Encounter (HOSPITAL_COMMUNITY): Payer: Self-pay | Admitting: Emergency Medicine

## 2012-02-18 ENCOUNTER — Emergency Department (HOSPITAL_COMMUNITY)
Admission: EM | Admit: 2012-02-18 | Discharge: 2012-02-18 | Disposition: A | Discharge status: Home / Self Care | Payer: 59 | Attending: Emergency Medicine | Admitting: Emergency Medicine

## 2012-02-18 DIAGNOSIS — S99919A Unspecified injury of unspecified ankle, initial encounter: Secondary | ICD-10-CM | POA: Insufficient documentation

## 2012-02-18 DIAGNOSIS — S8990XA Unspecified injury of unspecified lower leg, initial encounter: Secondary | ICD-10-CM | POA: Insufficient documentation

## 2012-02-18 DIAGNOSIS — Z9089 Acquired absence of other organs: Secondary | ICD-10-CM | POA: Insufficient documentation

## 2012-02-18 DIAGNOSIS — Y939 Activity, unspecified: Secondary | ICD-10-CM | POA: Insufficient documentation

## 2012-02-18 DIAGNOSIS — Y9289 Other specified places as the place of occurrence of the external cause: Secondary | ICD-10-CM | POA: Insufficient documentation

## 2012-02-18 DIAGNOSIS — S79919A Unspecified injury of unspecified hip, initial encounter: Secondary | ICD-10-CM | POA: Insufficient documentation

## 2012-02-18 DIAGNOSIS — Z79899 Other long term (current) drug therapy: Secondary | ICD-10-CM | POA: Insufficient documentation

## 2012-02-18 DIAGNOSIS — R011 Cardiac murmur, unspecified: Secondary | ICD-10-CM | POA: Insufficient documentation

## 2012-02-18 DIAGNOSIS — M25559 Pain in unspecified hip: Secondary | ICD-10-CM

## 2012-02-18 DIAGNOSIS — Z872 Personal history of diseases of the skin and subcutaneous tissue: Secondary | ICD-10-CM | POA: Insufficient documentation

## 2012-02-18 DIAGNOSIS — F411 Generalized anxiety disorder: Secondary | ICD-10-CM | POA: Insufficient documentation

## 2012-02-18 DIAGNOSIS — S79929A Unspecified injury of unspecified thigh, initial encounter: Secondary | ICD-10-CM | POA: Insufficient documentation

## 2012-02-18 DIAGNOSIS — W010XXA Fall on same level from slipping, tripping and stumbling without subsequent striking against object, initial encounter: Secondary | ICD-10-CM | POA: Insufficient documentation

## 2012-02-18 DIAGNOSIS — F17200 Nicotine dependence, unspecified, uncomplicated: Secondary | ICD-10-CM | POA: Insufficient documentation

## 2012-02-18 DIAGNOSIS — F329 Major depressive disorder, single episode, unspecified: Secondary | ICD-10-CM | POA: Insufficient documentation

## 2012-02-18 DIAGNOSIS — R209 Unspecified disturbances of skin sensation: Secondary | ICD-10-CM | POA: Insufficient documentation

## 2012-02-18 DIAGNOSIS — M79606 Pain in leg, unspecified: Secondary | ICD-10-CM

## 2012-02-18 DIAGNOSIS — F3289 Other specified depressive episodes: Secondary | ICD-10-CM | POA: Insufficient documentation

## 2012-02-18 DIAGNOSIS — E079 Disorder of thyroid, unspecified: Secondary | ICD-10-CM | POA: Insufficient documentation

## 2012-02-18 LAB — POCT I-STAT, CHEM 8
BUN: 11 mg/dL (ref 6–23)
Calcium, Ion: 1.15 mmol/L (ref 1.12–1.23)
Hemoglobin: 14.6 g/dL (ref 12.0–15.0)
TCO2: 23 mmol/L (ref 0–100)

## 2012-02-18 MED ORDER — FENTANYL CITRATE 0.05 MG/ML IJ SOLN
50.0000 ug | Freq: Once | INTRAMUSCULAR | Status: AC
  Administered 2012-02-18: 50 ug via NASAL

## 2012-02-18 MED ORDER — FENTANYL CITRATE 0.05 MG/ML IJ SOLN
50.0000 ug | Freq: Once | INTRAMUSCULAR | Status: DC
  Filled 2012-02-18: qty 2

## 2012-02-18 MED ORDER — IBUPROFEN 800 MG PO TABS
800.0000 mg | ORAL_TABLET | Freq: Once | ORAL | Status: AC
  Administered 2012-02-18: 800 mg via ORAL
  Filled 2012-02-18: qty 1

## 2012-02-18 MED ORDER — PROMETHAZINE HCL 25 MG RE SUPP
25.0000 mg | Freq: Once | RECTAL | Status: DC
Start: 2012-02-18 — End: 2012-02-18
  Filled 2012-02-18: qty 1

## 2012-02-18 MED ORDER — HYDROMORPHONE HCL PF 1 MG/ML IJ SOLN
1.0000 mg | Freq: Once | INTRAMUSCULAR | Status: AC
  Administered 2012-02-18: 1 mg via INTRAMUSCULAR
  Filled 2012-02-18: qty 1

## 2012-02-18 MED ORDER — HYDROMORPHONE HCL PF 2 MG/ML IJ SOLN
2.0000 mg | Freq: Once | INTRAMUSCULAR | Status: AC
  Administered 2012-02-18: 2 mg via INTRAMUSCULAR
  Filled 2012-02-18: qty 1

## 2012-02-18 MED ORDER — ONDANSETRON 8 MG PO TBDP
8.0000 mg | ORAL_TABLET | Freq: Once | ORAL | Status: AC
  Administered 2012-02-18: 8 mg via ORAL
  Filled 2012-02-18: qty 1

## 2012-02-18 MED ORDER — OXYCODONE-ACETAMINOPHEN 5-325 MG PO TABS: 1.0000 | ORAL_TABLET | Freq: Four times a day (QID) | ORAL | Status: DC | PRN

## 2012-02-18 NOTE — ED Notes (Signed)
Pt states she doesn't think she can lay flat or stop vomiting for the 30 mins she will have to in MRI.  MRI waited but left until pt could better able to tolerate laying flat.

## 2012-02-18 NOTE — ED Notes (Signed)
Pt states that she slipped on tea and fell on Sunday landing on her left side. Pt has had increasing pain in left lower extremity since.  Last night she called EMS, they came out and did 12 lead EKG, checked her blood sugar everything looked good except for her pain, and pt refused at time to be transported to hospital. Pt cant bear weight on left extremity and states that her toes are going numb.

## 2012-02-18 NOTE — ED Notes (Signed)
Pt ambulated with 1 person assist and 1 person stand-by, from stretcher to doorway of ED room and back to chair at bedside.  Pt would shuffle left foot forward and step right foot to match up with left foot.  Pt got very nauseated when ambulating, why we stopped at doorway and went back in pt's room. [name] PA made aware.

## 2012-02-18 NOTE — ED Notes (Signed)
JYN:[id number]<AO> Expected date:02/18/12<BR> Expected time: 9:55 AM<BR> Means of arrival:Car<BR> Comments:<BR>

## 2012-02-18 NOTE — ED Provider Notes (Signed)
History     CSN: [id number]  Arrival date & time 02/18/12  1018   First MD Initiated Contact with Patient 02/18/12 1023      Chief Complaint  Patient presents with  . Leg Pain  . Fall    (Consider location/radiation/quality/duration/timing/severity/associated sxs/prior treatment) HPI Comments: Jasmine Long is a 62 y.o. female w a hx of anxiety and HTN presents to the ER c/o left leg pain s/p fall that occurred on Sunday. Pt states that fall was mechanical occuring in her kitchen on a hard surface, mechanism slipping. Point of impact was left side of lower body. Pt denies hitting her head or neck, any LOC, or being on blood thinners. After incident pt was able to ambulate with a cane, however the pain has been gradually worsening and she can no longer bear weight. Pain began in the left hip and had been moving down to her left knee medially. Severity is rated at 10/10 in severity. Characterized as sharp and stabbing in nature. Worst with movement, palpation, ambulation and certain positions. Associated symptoms include subjective numbness. Pain relieved by nothing. Pt called 911 last evening and was evaluated, but refused to come to the ED. Today pain was unbearable and she decided to accept the EMS transport.   The history is provided by the patient.    Past Medical History  Diagnosis Date  . Allergy   . Depression   . Ulcer   . Thyroid disease   . Anxiety   . Heart murmur     Past Surgical History  Procedure Date  . Cholecystectomy   . Cesarean section   . Nasal septum surgery   . Breast surgery     No family history on file.  History  Substance Use Topics  . Smoking status: Current Every Day Smoker -- 1.5 packs/day for 40 years    Types: Cigarettes    Last Attempt to Quit: 01/20/2012  . Smokeless tobacco: Not on file     Comment: Pt doing E-cigarette as of 01/20/12  . Alcohol Use: No    OB History    Grav Para Term Preterm Abortions TAB SAB Ect Mult Living            Review of Systems  All other systems reviewed and are negative.    Allergies  Review of patient's allergies indicates no known allergies.  Home Medications   Current Outpatient Rx  Name  Route  Sig  Dispense  Refill  . ALPRAZOLAM 0.5 MG PO TABS      One to two at bedtime prn   60 tablet   5   . BUPROPION HCL ER (XL) 300 MG PO TB24   Oral   Take 1 tablet (300 mg total) by mouth daily.   30 tablet   11   . CLONAZEPAM 1 MG PO TABS   Oral   Take 1 tablet (1 mg total) by mouth 3 (three) times daily as needed (1/2 to 1 tid prn).   90 tablet   5   . LEVOTHYROXINE SODIUM 125 MCG PO TABS   Oral   Take 1 tablet (125 mcg total) by mouth daily.   90 tablet   3   . LOSARTAN POTASSIUM 50 MG PO TABS   Oral   Take 1 tablet (50 mg total) by mouth daily.   90 tablet   3     BP 116/44  Pulse 79  Temp 98.1 F (36.7 C) (Oral)  Resp  18  SpO2 95%  Physical Exam  Nursing note and vitals reviewed. Constitutional: She appears well-developed and well-nourished. She appears distressed.  HENT:  Head: Normocephalic and atraumatic.  Eyes: Conjunctivae normal and EOM are normal.  Neck: Normal range of motion. Neck supple.  Cardiovascular:       Intact distal pulses, capillary refill < 3 seconds  Musculoskeletal:       LLE: Significant ttp along anterior thigh and trochanteric region. No pain with inversion or eversion of hip. Non tender lumbar spine. All other extremities with normal ROM  Neurological:       Decreased sensation of left lateral thigh. Strength 4/5 LLE vs 5/5 RLE  Skin: She is not diaphoretic.       Skin intact, no tenting    ED Course  Procedures (including critical care time)  Labs Reviewed  POCT I-STAT, CHEM 8 - Abnormal; Notable for the following:    Glucose, Bld 137 (*)     All other components within normal limits   Ct Hip Left Wo Contrast  02/18/2012  *RADIOLOGY REPORT*  Clinical Data: Fall.  Left hip pain.  CT OF THE LEFT HIP WITHOUT  CONTRAST  Technique:  Multidetector CT imaging was performed according to the standard protocol. Multiplanar CT image reconstructions were also generated.  Comparison: 07/20/2008  Findings: Chronic acetabular spurring noted.  There is spurring of the ischial tuberosity and spurring along the greater trochanter and left femoral neck, similar in distribution to prior exam.  Mild spurring of the lesser trochanter noted.  Slight spur-like irregularity noted laterally along the anterior portion of the inferior pubic ramus, no change from 2010.  No discrete fracture identified.  There is sigmoid diverticulosis. No compelling CT findings of hip effusion or significant regional bursitis.  No impinging lesion along the left sciatic nerve is observed.  IMPRESSION:  1.  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  If the patient is unable to bear weight or if more sensitive imaging workup is required, MRI could be utilized for further characterization. 2.  Sigmoid diverticulosis.   Original Report Authenticated By: Gaylyn Rong, M.D.    11:00 AM Nurse unable to obtain IV access, 2mg  Dilaudid ordered.  12:30 PM Above findings discussed with attending. As pt is unable to ambulate, MR will be ordered per radiologist recommendation. Pain medications ordered  2:00 PM Pt did not tolerate medication well, with hyperemesis interfering with ability to perform MRI. Per radiologist pt will be tried again at 5:30.   5:45 PM Pt states pain has returned and she "needs" another shot. Explained in depth that this is the likely etiology of her emesis and that it will interfere with imaging. 1 mg Dilaudid ordered  6:37 PM Pt again did not tolerate MRI and states that the pain is no longer in her hip but now in her thigh. Nurse tech will ambulate to assess ability to bear weight.   7:14 PM Pt able to bear weight. Will dc home w STRICT return precautions for the  development of any lower extremity swelling, temperature change,  weakness, numbness or tingling. Will give pain rx. Pt agreeable with plan   No diagnosis found.    MDM  Fall, hip & thigh pain  CT neg for acute fracture or dislocation. Pt able to ambulate w weight bearing after pain medication. Strength testing re-done prior to dc and pt equal bilaterally. No lumbar pain per pt.  Advised return if s/s  worsen. Ed course as above.       Jaci Carrel, [place] 02/18/12 1920

## 2012-02-19 ENCOUNTER — Observation Stay (HOSPITAL_BASED_OUTPATIENT_CLINIC_OR_DEPARTMENT_OTHER)
Admission: EM | Admit: 2012-02-19 | Discharge: 2012-02-20 | Discharge status: Against Medical Advice | Payer: 59 | Attending: Emergency Medicine | Admitting: Emergency Medicine

## 2012-02-19 ENCOUNTER — Encounter (HOSPITAL_BASED_OUTPATIENT_CLINIC_OR_DEPARTMENT_OTHER): Payer: Self-pay | Admitting: Emergency Medicine

## 2012-02-19 DIAGNOSIS — F17200 Nicotine dependence, unspecified, uncomplicated: Secondary | ICD-10-CM | POA: Insufficient documentation

## 2012-02-19 DIAGNOSIS — M5416 Radiculopathy, lumbar region: Secondary | ICD-10-CM

## 2012-02-19 DIAGNOSIS — F411 Generalized anxiety disorder: Secondary | ICD-10-CM | POA: Insufficient documentation

## 2012-02-19 DIAGNOSIS — R209 Unspecified disturbances of skin sensation: Secondary | ICD-10-CM | POA: Insufficient documentation

## 2012-02-19 DIAGNOSIS — F3289 Other specified depressive episodes: Secondary | ICD-10-CM | POA: Insufficient documentation

## 2012-02-19 DIAGNOSIS — M79609 Pain in unspecified limb: Principal | ICD-10-CM | POA: Insufficient documentation

## 2012-02-19 DIAGNOSIS — F329 Major depressive disorder, single episode, unspecified: Secondary | ICD-10-CM | POA: Insufficient documentation

## 2012-02-19 DIAGNOSIS — F4310 Post-traumatic stress disorder, unspecified: Secondary | ICD-10-CM | POA: Insufficient documentation

## 2012-02-19 DIAGNOSIS — Z79899 Other long term (current) drug therapy: Secondary | ICD-10-CM | POA: Insufficient documentation

## 2012-02-19 NOTE — ED Provider Notes (Signed)
Medical screening examination/treatment/procedure(s) were performed by non-physician practitioner and as supervising physician I was immediately available for consultation/collaboration.  Dannell Gortney R. Glenda Kunst, MD 02/19/12 0652 

## 2012-02-19 NOTE — ED Provider Notes (Addendum)
History     CSN: [id number]  Arrival date & time 02/19/12  2326   First MD Initiated Contact with Patient 02/19/12 2348      Chief Complaint  Patient presents with  . Leg Pain    (Consider location/radiation/quality/duration/timing/severity/associated sxs/prior treatment) HPI This is a 62 year old female who fell 5 days ago. She had some minor left thigh pain at that time. Throughout the week the pain is worsened. The pain is located in about the L3 dermatome on the left. She was seen at [facility] yesterday and had plain films and CT scan that showed no evidence of a fracture in her left hip. An MRI was attempted but she could not tolerate this due to emesis from the Dilaudid she received. She was discharged home with Percocet after demonstrating ability to bear weight. Despite this she has been unable to ambulate at home, dragging herself around the house with her elbows. She states the pain in her left leg is severe with associated paresthesias, again in the L3 dermatome. She denies acute bowel or bladder changes. Motor function is intact her left lower extremity but she is unable to move it due to severe pain with movement of the left hip. She has only taken one Percocet since being discharged.  Past Medical History  Diagnosis Date  . Allergy   . Depression   . Ulcer   . Thyroid disease   . Anxiety   . Heart murmur     Past Surgical History  Procedure Date  . Cholecystectomy   . Cesarean section   . Nasal septum surgery   . Breast surgery     No family history on file.  History  Substance Use Topics  . Smoking status: Current Every Day Smoker -- 1.5 packs/day for 40 years    Types: Cigarettes    Last Attempt to Quit: 01/20/2012  . Smokeless tobacco: Not on file     Comment: Pt doing E-cigarette as of 01/20/12  . Alcohol Use: No    OB History    Grav Para Term Preterm Abortions TAB SAB Ect Mult Living                  Review of Systems  All other systems  reviewed and are negative.    Allergies  Review of patient's allergies indicates no known allergies.  Home Medications   Current Outpatient Rx  Name  Route  Sig  Dispense  Refill  . ALPRAZOLAM 0.5 MG PO TABS      One to two at bedtime prn   60 tablet   5   . BUPROPION HCL ER (XL) 300 MG PO TB24   Oral   Take 1 tablet (300 mg total) by mouth daily.   30 tablet   11   . CLONAZEPAM 1 MG PO TABS   Oral   Take 1 tablet (1 mg total) by mouth 3 (three) times daily as needed (1/2 to 1 tid prn).   90 tablet   5   . LEVOTHYROXINE SODIUM 125 MCG PO TABS   Oral   Take 1 tablet (125 mcg total) by mouth daily.   90 tablet   3   . LOSARTAN POTASSIUM 50 MG PO TABS   Oral   Take 1 tablet (50 mg total) by mouth daily.   90 tablet   3   . OXYCODONE-ACETAMINOPHEN 5-325 MG PO TABS   Oral   Take 1 tablet by mouth every 6 (six)  hours as needed for pain.   15 tablet   0     BP 154/77  Pulse 80  Temp 98.2 F (36.8 C) (Oral)  Resp 16  Ht 5\' 9"  (1.753 m)  Wt 260 lb (117.935 kg)  BMI 38.40 kg/m2  SpO2 100%  Physical Exam General: Well-developed, well-nourished female in no acute distress; appearance consistent with age of record HENT: normocephalic, atraumatic Eyes: pupils equal round and reactive to light; extraocular muscles intact Neck: supple Heart: regular rate and rhythm Lungs: Normal respiratory effort and excursion Abdomen: soft; nondistended Extremities: No deformity; full range of motion except severely limited range of motion left hip due to pain; pulses normal Neurologic: Awake, alert and oriented; motor function intact in all extremities and symmetric although examination of the left lower extremity is limited due to pain on movement of the left hip; no facial droop; decreased sensation of left anterior thigh and about the L3 dermatome Skin: Warm and dry Psychiatric: Normal mood and affect    ED Course  Procedures (including critical care time)     MDM    Nursing notes and vitals signs, including pulse oximetry, reviewed.  Summary of this visit's results, reviewed by myself:  Labs:  Results for orders placed during the hospital encounter of 02/19/12 (from the past 24 hour(s))  CBC WITH DIFFERENTIAL     Status: Abnormal   Collection Time   02/20/12  1:05 AM      Component Value Range   WBC 13.2 (*) 4.0 - 10.5 K/uL   RBC 4.51  3.87 - 5.11 MIL/uL   Hemoglobin 13.7  12.0 - 15.0 g/dL   HCT 40.9  81.1 - 91.4 %   MCV 89.4  78.0 - 100.0 fL   MCH 30.4  26.0 - 34.0 pg   MCHC 34.0  30.0 - 36.0 g/dL   RDW 78.2  95.6 - 21.3 %   Platelets 358  150 - 400 K/uL   Neutrophils Relative 66  43 - 77 %   Neutro Abs 8.7 (*) 1.7 - 7.7 K/uL   Lymphocytes Relative 24  12 - 46 %   Lymphs Abs 3.2  0.7 - 4.0 K/uL   Monocytes Relative 10  3 - 12 %   Monocytes Absolute 1.3 (*) 0.1 - 1.0 K/uL   Eosinophils Relative 0  0 - 5 %   Eosinophils Absolute 0.0  0.0 - 0.7 K/uL   Basophils Relative 0  0 - 1 %   Basophils Absolute 0.0  0.0 - 0.1 K/uL  BASIC METABOLIC PANEL     Status: Abnormal   Collection Time   02/20/12  1:05 AM      Component Value Range   Sodium 138  135 - 145 mEq/L   Potassium 3.3 (*) 3.5 - 5.1 mEq/L   Chloride 100  96 - 112 mEq/L   CO2 24  19 - 32 mEq/L   Glucose, Bld 146 (*) 70 - 99 mg/dL   BUN 11  6 - 23 mg/dL   Creatinine, Ser 0.86  0.50 - 1.10 mg/dL   Calcium 9.5  8.4 - 57.8 mg/dL   GFR calc non Af Amer >90  >90 mL/min   GFR calc Af Amer >90  >90 mL/min    Imaging Studies: Ct Hip Left Wo Contrast  02/18/2012  *RADIOLOGY REPORT*  Clinical Data: Fall.  Left hip pain.  CT OF THE LEFT HIP WITHOUT CONTRAST  Technique:  Multidetector CT imaging was performed according to the  standard protocol. Multiplanar CT image reconstructions were also generated.  Comparison: 07/20/2008  Findings: Chronic acetabular spurring noted.  There is spurring of the ischial tuberosity and spurring along the greater trochanter and left femoral neck, similar in  distribution to prior exam.  Mild spurring of the lesser trochanter noted.  Slight spur-like irregularity noted laterally along the anterior portion of the inferior pubic ramus, no change from 2010.  No discrete fracture identified.  There is sigmoid diverticulosis. No compelling CT findings of hip effusion or significant regional bursitis.  No impinging lesion along the left sciatic nerve is observed.  IMPRESSION:  1.  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  If the patient is unable to bear weight or if more sensitive imaging workup is required, MRI could be utilized for further characterization. 2.  Sigmoid diverticulosis.   Original Report Authenticated By: Gaylyn Rong, M.D.    2:14 AM Patient resting comfortably. She states she does not like the way narcotics are making her feel does not desire anything else for pain right now. She states she is physically unable to move her left leg and her left hip however nursing staff has seen her move her left leg at the hip multiple times.  4:42 AM Patient accepted to the Hospitalist service at [facility]. Patient has requested Dilaudid but we have avoided the long it as this caused intractable nausea and vomiting which she was at [facility] preventing her from having an MRI. We have continued to treat her pain with fentanyl.  5:04 AM The patient refuses transport to [facility]. She states she has posttraumatic stress disorder which causes her to be unable to tolerate that facility. Despite having a ready room in [facility] she has elected to sign out against medical device.         Hanley Seamen, MD 02/20/12 9604  Hanley Seamen, MD 02/20/12 [phone]

## 2012-02-19 NOTE — ED Notes (Signed)
Pt fell 5 days ago.  Continues to have Left Leg pain.  Was seen at [facility] last night with radiology studies done.  Pt DC'd to home with Percocet Rx.  Pain is still unchanged with increased numbness.  Pt presents for re-evaluation.

## 2012-02-20 ENCOUNTER — Emergency Department (HOSPITAL_BASED_OUTPATIENT_CLINIC_OR_DEPARTMENT_OTHER): Payer: 59

## 2012-02-20 LAB — CBC WITH DIFFERENTIAL/PLATELET
Basophils Absolute: 0 10*3/uL (ref 0.0–0.1)
Eosinophils Absolute: 0 10*3/uL (ref 0.0–0.7)
Eosinophils Relative: 0 % (ref 0–5)
Lymphocytes Relative: 24 % (ref 12–46)
MCH: 30.4 pg (ref 26.0–34.0)
MCV: 89.4 fL (ref 78.0–100.0)
Neutrophils Relative %: 66 % (ref 43–77)
Platelets: 358 10*3/uL (ref 150–400)
RDW: 14.1 % (ref 11.5–15.5)
WBC: 13.2 10*3/uL — ABNORMAL HIGH (ref 4.0–10.5)

## 2012-02-20 LAB — BASIC METABOLIC PANEL
Calcium: 9.5 mg/dL (ref 8.4–10.5)
GFR calc non Af Amer: >90 mL/min (ref >90)
Sodium: 138 mEq/L (ref 135–145)

## 2012-02-20 MED ORDER — FENTANYL CITRATE 0.05 MG/ML IJ SOLN
100.0000 ug | Freq: Once | INTRAMUSCULAR | Status: AC
  Administered 2012-02-20: 100 ug via INTRAVENOUS
  Filled 2012-02-20: qty 2

## 2012-02-20 MED ORDER — METHYLPREDNISOLONE SODIUM SUCC 125 MG IJ SOLR
125.0000 mg | Freq: Once | INTRAMUSCULAR | Status: AC
  Administered 2012-02-20: 125 mg via INTRAVENOUS
  Filled 2012-02-20: qty 2

## 2012-02-20 MED ORDER — SODIUM CHLORIDE 0.9 % IV SOLN
Freq: Once | INTRAVENOUS | Status: AC
  Administered 2012-02-20: 20 mL/h via INTRAVENOUS

## 2012-02-20 MED ORDER — ONDANSETRON 4 MG PO TBDP
4.0000 mg | ORAL_TABLET | Freq: Once | ORAL | Status: AC
  Administered 2012-02-20: 4 mg via ORAL
  Filled 2012-02-20: qty 1

## 2012-02-20 MED ORDER — FENTANYL CITRATE 0.05 MG/ML IJ SOLN
200.0000 ug | Freq: Once | INTRAMUSCULAR | Status: AC
  Administered 2012-02-20: 200 ug via INTRAMUSCULAR
  Filled 2012-02-20: qty 4

## 2012-02-20 NOTE — ED Notes (Signed)
[facility] on scene to transport pt home.

## 2012-02-20 NOTE — ED Notes (Signed)
Patient called for assistance to use urinal. Patient was able to ambulate self to sitting position without assistance. Patient sat on her own until able to urinate at bedside approx. 200 ML's. Patient slowly moved herself back to laying position, did not want me to touch her leg or assist her.

## 2012-02-20 NOTE — ED Notes (Signed)
Pt refuses admission to [facility]. [facility] notified of need for ambulance to take pt home.

## 2012-02-20 NOTE — ED Notes (Addendum)
Pt accepted by hospitalist at [facility] and bed request placed. When pt informed that she would be transferred to [facility], pt now sts that she cannot go to [facility] because that is where her son died and she now has PTSD. I explained to the pt that her only options at this point were admission to [facility] or she can sign out AMA and we'll have an ambulance take her home. Pt has been very difficult and has rejected every effort that has been made to accommodate her.

## 2012-02-20 NOTE — ED Notes (Signed)
Attempted to call pts son. No answer 

## 2012-02-20 NOTE — ED Notes (Signed)
Pt has stated numerous times throughout her stay that she cannot move her left leg or stand on it. Pt observed several times moving her left leg. Pt also observed sitting on the side of her bed with her left foot on the floor and pushing herself up. Pt advises that she cannot go home due to not being able to get inside her house. When advised that an ambulance can take her home and place her in her bed, pt asks if she can just have the ambulance take her to [facility]. I spoke with Dr. Read Drivers who began the admission process.

## 2012-02-20 NOTE — ED Notes (Signed)
Pts son is Comptroller. Cell Phone [phone].

## 2012-02-21 ENCOUNTER — Encounter (HOSPITAL_COMMUNITY): Payer: Self-pay | Admitting: *Deleted

## 2012-02-21 ENCOUNTER — Emergency Department (HOSPITAL_COMMUNITY)
Admission: EM | Admit: 2012-02-21 | Discharge: 2012-02-21 | Disposition: A | Discharge status: Home / Self Care | Payer: 59 | Attending: Emergency Medicine | Admitting: Emergency Medicine

## 2012-02-21 ENCOUNTER — Emergency Department (HOSPITAL_COMMUNITY): Payer: 59

## 2012-02-21 DIAGNOSIS — Y929 Unspecified place or not applicable: Secondary | ICD-10-CM | POA: Insufficient documentation

## 2012-02-21 DIAGNOSIS — R209 Unspecified disturbances of skin sensation: Secondary | ICD-10-CM | POA: Insufficient documentation

## 2012-02-21 DIAGNOSIS — F411 Generalized anxiety disorder: Secondary | ICD-10-CM | POA: Insufficient documentation

## 2012-02-21 DIAGNOSIS — F3289 Other specified depressive episodes: Secondary | ICD-10-CM | POA: Insufficient documentation

## 2012-02-21 DIAGNOSIS — Z79899 Other long term (current) drug therapy: Secondary | ICD-10-CM | POA: Insufficient documentation

## 2012-02-21 DIAGNOSIS — M84353A Stress fracture, unspecified femur, initial encounter for fracture: Secondary | ICD-10-CM | POA: Insufficient documentation

## 2012-02-21 DIAGNOSIS — Z8719 Personal history of other diseases of the digestive system: Secondary | ICD-10-CM | POA: Insufficient documentation

## 2012-02-21 DIAGNOSIS — R296 Repeated falls: Secondary | ICD-10-CM | POA: Insufficient documentation

## 2012-02-21 DIAGNOSIS — Y939 Activity, unspecified: Secondary | ICD-10-CM | POA: Insufficient documentation

## 2012-02-21 DIAGNOSIS — M84359A Stress fracture, hip, unspecified, initial encounter for fracture: Secondary | ICD-10-CM

## 2012-02-21 DIAGNOSIS — R011 Cardiac murmur, unspecified: Secondary | ICD-10-CM | POA: Insufficient documentation

## 2012-02-21 DIAGNOSIS — F329 Major depressive disorder, single episode, unspecified: Secondary | ICD-10-CM | POA: Insufficient documentation

## 2012-02-21 DIAGNOSIS — F17200 Nicotine dependence, unspecified, uncomplicated: Secondary | ICD-10-CM | POA: Insufficient documentation

## 2012-02-21 DIAGNOSIS — E079 Disorder of thyroid, unspecified: Secondary | ICD-10-CM | POA: Insufficient documentation

## 2012-02-21 MED ORDER — KETOROLAC TROMETHAMINE 30 MG/ML IJ SOLN
30.0000 mg | Freq: Once | INTRAMUSCULAR | Status: AC
  Administered 2012-02-21: 30 mg via INTRAMUSCULAR
  Filled 2012-02-21: qty 1

## 2012-02-21 MED ORDER — NAPROXEN 375 MG PO TABS: 375.0000 mg | ORAL_TABLET | Freq: Two times a day (BID) | ORAL | Status: DC | PRN

## 2012-02-21 MED ORDER — DIAZEPAM 5 MG PO TABS
5.0000 mg | ORAL_TABLET | Freq: Once | ORAL | Status: AC
  Administered 2012-02-21: 5 mg via ORAL
  Filled 2012-02-21: qty 1

## 2012-02-21 NOTE — ED Notes (Signed)
Reports since fall Sunday a week ago, can not lift left leg, or maneuver, or full weight bear has had to use her Father's cane.

## 2012-02-21 NOTE — ED Notes (Signed)
Patient fell on Sunday and injured her left leg.  Patient c/o pain on her left hip radiating to her left knee.  Patient states that her left toes feel numb.  Patient was seen in ED and did CT and could not do MRI due to pain medication.  Patient has been having more pain on the left leg.  Patient was seen at [facility] and was sent home.

## 2012-02-26 ENCOUNTER — Other Ambulatory Visit: Payer: Self-pay | Admitting: Radiology

## 2012-03-01 NOTE — ED Provider Notes (Signed)
History    62 year old female with left proximal thigh and hip pain. Radiates to her left knee. Patient had a fall on Sunday and has had persistent pain since then. Tingling in her toes. No loss of strength. Patient with recent ER evaluations for the same. MRI recommended and was to be transferred, to previous evaluation but patient refused at that time.   CSN: [id number]  Arrival date & time 02/21/12  1049   First MD Initiated Contact with Patient 02/21/12 1106      Chief Complaint  Patient presents with  . Leg Pain    (Consider location/radiation/quality/duration/timing/severity/associated sxs/prior treatment) HPI  Past Medical History  Diagnosis Date  . Allergy   . Depression   . Ulcer   . Thyroid disease   . Anxiety   . Heart murmur     Past Surgical History  Procedure Date  . Cholecystectomy   . Cesarean section   . Nasal septum surgery   . Breast surgery     History reviewed. No pertinent family history.  History  Substance Use Topics  . Smoking status: Current Every Day Smoker -- 1.5 packs/day for 40 years    Types: Cigarettes    Last Attempt to Quit: 01/20/2012  . Smokeless tobacco: Not on file     Comment: Pt doing E-cigarette as of 01/20/12  . Alcohol Use: No    OB History    Grav Para Term Preterm Abortions TAB SAB Ect Mult Living                  Review of Systems  All systems reviewed and negative, other than as noted in HPI.   Allergies  Review of patient's allergies indicates no known allergies.  Home Medications   Current Outpatient Rx  Name  Route  Sig  Dispense  Refill  . ALPRAZOLAM 0.5 MG PO TABS   Oral   Take 0.5-1 mg by mouth at bedtime as needed. For sleep or anxiety         . BUPROPION HCL ER (XL) 300 MG PO TB24   Oral   Take 300 mg by mouth every morning.         Marland Kitchen CLONAZEPAM 1 MG PO TABS   Oral   Take 1 mg by mouth 3 (three) times daily as needed. For sleep or anxiety         . LEVOTHYROXINE SODIUM 125 MCG  PO TABS   Oral   Take 125 mcg by mouth daily before breakfast.         . LOSARTAN POTASSIUM 50 MG PO TABS   Oral   Take 50 mg by mouth every morning.         . OXYCODONE-ACETAMINOPHEN 5-325 MG PO TABS   Oral   Take 1 tablet by mouth every 6 (six) hours as needed for pain.   15 tablet   0   . NAPROXEN 375 MG PO TABS   Oral   Take 1 tablet (375 mg total) by mouth 2 (two) times daily as needed.   20 tablet   0     BP 117/66  Pulse 72  Temp 98.4 F (36.9 C) (Oral)  Resp 16  SpO2 97%  Physical Exam  Nursing note and vitals reviewed. Constitutional: She appears well-developed and well-nourished. No distress.  HENT:  Head: Normocephalic and atraumatic.  Eyes: Conjunctivae normal are normal. Right eye exhibits no discharge. Left eye exhibits no discharge.  Neck: Neck supple.  Cardiovascular:  Normal rate, regular rhythm and normal heart sounds.  Exam reveals no gallop and no friction rub.   No murmur heard. Pulmonary/Chest: Effort normal and breath sounds normal. No respiratory distress.  Abdominal: Soft. She exhibits no distension. There is no tenderness.  Musculoskeletal: She exhibits no edema and no tenderness.       Pelvis stable to rocking. Increased pain with range of motion of the left hip, particularly internal rotation. No shortening of the lower extremities. No malrotation. Neurovascular intact distally. No concerning overlying skin changes.  Neurological: She is alert.  Skin: Skin is warm and dry.  Psychiatric: She has a normal mood and affect. Her behavior is normal. Thought content normal.    ED Course  Procedures (including critical care time)  Labs Reviewed - No data to display No results found.   1. Stress fracture of hip       MDM  62 year old female with left pain. Patient with multiple recent evaluations for the same. She was able to tolerate an MRI today. This is consistent with a stress fracture. Plan symptomatic treatment. Orthopedic  followup.        Raeford Razor, MD 03/01/12 [phone]

## 2012-08-07 ENCOUNTER — Emergency Department (HOSPITAL_COMMUNITY): Payer: Self-pay

## 2012-08-07 ENCOUNTER — Emergency Department (HOSPITAL_COMMUNITY)
Admission: EM | Admit: 2012-08-07 | Discharge: 2012-08-08 | Disposition: A | Discharge status: Home / Self Care | Payer: Self-pay | Attending: Emergency Medicine | Admitting: Emergency Medicine

## 2012-08-07 ENCOUNTER — Encounter (HOSPITAL_COMMUNITY): Payer: Self-pay

## 2012-08-07 ENCOUNTER — Emergency Department (HOSPITAL_COMMUNITY)
Admission: EM | Admit: 2012-08-07 | Discharge: 2012-08-07 | Discharge status: Against Medical Advice | Payer: Self-pay | Attending: Emergency Medicine | Admitting: Emergency Medicine

## 2012-08-07 DIAGNOSIS — R110 Nausea: Secondary | ICD-10-CM | POA: Insufficient documentation

## 2012-08-07 DIAGNOSIS — Z79899 Other long term (current) drug therapy: Secondary | ICD-10-CM | POA: Insufficient documentation

## 2012-08-07 DIAGNOSIS — R011 Cardiac murmur, unspecified: Secondary | ICD-10-CM | POA: Insufficient documentation

## 2012-08-07 DIAGNOSIS — F17200 Nicotine dependence, unspecified, uncomplicated: Secondary | ICD-10-CM | POA: Insufficient documentation

## 2012-08-07 DIAGNOSIS — F411 Generalized anxiety disorder: Secondary | ICD-10-CM | POA: Insufficient documentation

## 2012-08-07 DIAGNOSIS — F329 Major depressive disorder, single episode, unspecified: Secondary | ICD-10-CM | POA: Insufficient documentation

## 2012-08-07 DIAGNOSIS — Z8679 Personal history of other diseases of the circulatory system: Secondary | ICD-10-CM | POA: Insufficient documentation

## 2012-08-07 DIAGNOSIS — F3289 Other specified depressive episodes: Secondary | ICD-10-CM | POA: Insufficient documentation

## 2012-08-07 DIAGNOSIS — E079 Disorder of thyroid, unspecified: Secondary | ICD-10-CM | POA: Insufficient documentation

## 2012-08-07 DIAGNOSIS — Z872 Personal history of diseases of the skin and subcutaneous tissue: Secondary | ICD-10-CM | POA: Insufficient documentation

## 2012-08-07 DIAGNOSIS — R079 Chest pain, unspecified: Secondary | ICD-10-CM

## 2012-08-07 DIAGNOSIS — Z0489 Encounter for examination and observation for other specified reasons: Secondary | ICD-10-CM | POA: Insufficient documentation

## 2012-08-07 DIAGNOSIS — R000 Tachycardia, unspecified: Secondary | ICD-10-CM | POA: Insufficient documentation

## 2012-08-07 DIAGNOSIS — K30 Functional dyspepsia: Secondary | ICD-10-CM

## 2012-08-07 DIAGNOSIS — R072 Precordial pain: Secondary | ICD-10-CM | POA: Insufficient documentation

## 2012-08-07 DIAGNOSIS — R1013 Epigastric pain: Secondary | ICD-10-CM | POA: Insufficient documentation

## 2012-08-07 DIAGNOSIS — K3189 Other diseases of stomach and duodenum: Secondary | ICD-10-CM | POA: Insufficient documentation

## 2012-08-07 DIAGNOSIS — R0602 Shortness of breath: Secondary | ICD-10-CM | POA: Insufficient documentation

## 2012-08-07 DIAGNOSIS — J329 Chronic sinusitis, unspecified: Secondary | ICD-10-CM | POA: Insufficient documentation

## 2012-08-07 DIAGNOSIS — J309 Allergic rhinitis, unspecified: Secondary | ICD-10-CM | POA: Insufficient documentation

## 2012-08-07 DIAGNOSIS — Z8589 Personal history of malignant neoplasm of other organs and systems: Secondary | ICD-10-CM | POA: Insufficient documentation

## 2012-08-07 LAB — CBC WITH DIFFERENTIAL/PLATELET
Basophils Absolute: 0 10*3/uL (ref 0.0–0.1)
Basophils Relative: 0 % (ref 0–1)
Eosinophils Absolute: 0.1 10*3/uL (ref 0.0–0.7)
Eosinophils Relative: 0 % (ref 0–5)
HCT: 42.2 % (ref 36.0–46.0)
Lymphocytes Relative: 14 % (ref 12–46)
MCHC: 33.9 g/dL (ref 30.0–36.0)
MCV: 89.4 fL (ref 78.0–100.0)
Monocytes Absolute: 1.3 10*3/uL — ABNORMAL HIGH (ref 0.1–1.0)
Platelets: 335 10*3/uL (ref 150–400)
RDW: 14 % (ref 11.5–15.5)
WBC: 17.9 10*3/uL — ABNORMAL HIGH (ref 4.0–10.5)

## 2012-08-07 LAB — COMPREHENSIVE METABOLIC PANEL
ALT: 15 U/L (ref 0–35)
AST: 12 U/L (ref 0–37)
Albumin: 3.5 g/dL (ref 3.5–5.2)
CO2: 23 mEq/L (ref 19–32)
Calcium: 9.6 mg/dL (ref 8.4–10.5)
Creatinine, Ser: 0.75 mg/dL (ref 0.50–1.10)
GFR calc non Af Amer: >90 mL/min (ref >90)
Sodium: 138 mEq/L (ref 135–145)
Total Protein: 7.4 g/dL (ref 6.0–8.3)

## 2012-08-07 LAB — POCT I-STAT TROPONIN I: Troponin i, poc: 0.01 ng/mL (ref 0.00–0.08)

## 2012-08-07 LAB — D-DIMER, QUANTITATIVE: D-Dimer, Quant: 0.27 ug/mL-FEU (ref 0.00–0.48)

## 2012-08-07 MED ORDER — AMOXICILLIN-POT CLAVULANATE 875-125 MG PO TABS
1.0000 | ORAL_TABLET | Freq: Once | ORAL | Status: AC
  Administered 2012-08-07: 1 via ORAL
  Filled 2012-08-07: qty 1

## 2012-08-07 MED ORDER — AMOXICILLIN-POT CLAVULANATE 875-125 MG PO TABS: 1.0000 | ORAL_TABLET | Freq: Two times a day (BID) | ORAL | Status: DC

## 2012-08-07 MED ORDER — GI COCKTAIL ~~LOC~~
30.0000 mL | Freq: Once | ORAL | Status: AC
  Administered 2012-08-07: 30 mL via ORAL
  Filled 2012-08-07: qty 30

## 2012-08-07 MED ORDER — CALCIUM CARBONATE ANTACID 500 MG PO CHEW
1.0000 | CHEWABLE_TABLET | Freq: Once | ORAL | Status: AC
  Administered 2012-08-07: 200 mg via ORAL
  Filled 2012-08-07: qty 1

## 2012-08-07 MED ORDER — SODIUM CHLORIDE 0.9 % IV BOLUS (SEPSIS)
1000.0000 mL | Freq: Once | INTRAVENOUS | Status: AC
  Administered 2012-08-07: 1000 mL via INTRAVENOUS

## 2012-08-07 NOTE — ED Notes (Signed)
, ,   r EMS patient C/O chest pain. Patient was apparently just watching TV when she started having chest pain. Patient has never experienced heartburn but believes this may be heart burn. Patient urged to come to the ED by her son to confirm heartburn or MI. Took 325mg  Aspirin on scene. No ST changes noted by 12lead. 160/90, 100, 99% GCS: 15

## 2012-08-07 NOTE — ED Notes (Signed)
[id number]<JY> Expected date:<BR> Expected time:<BR> Means of arrival:<BR> Comments:<BR> 62 y/o CP (heartburn? Anxiety?)

## 2012-08-07 NOTE — ED Provider Notes (Signed)
History    CSN: [id number] Arrival date & time 08/07/12  5366  First MD Initiated Contact with Patient 08/07/12 1855     Chief Complaint  Patient presents with  . Chest Pain   (Consider location/radiation/quality/duration/timing/severity/associated sxs/prior Treatment) The history is provided by the patient.  Jasmine Long is a 62 y.o. female history depression, anxiety here presenting with epigastric pain and chest pain. Ate pizza around 5 PM today. She was watching TV around 6 PM and had epigastric and substernal chest pain with a burning sensation. The pain initially radiated to her back and now she feels better after given aspirin 325 mg by EMS. Denies any vomiting or abdominal pain. Had some shortness of breath the resolved.   Past Medical History  Diagnosis Date  . Allergy   . Depression   . Ulcer   . Thyroid disease   . Anxiety   . Heart murmur    Past Surgical History  Procedure Laterality Date  . Cholecystectomy    . Cesarean section    . Nasal septum surgery    . Breast surgery     No family history on file. History  Substance Use Topics  . Smoking status: Current Every Day Smoker -- 1.50 packs/day for 40 years    Types: Cigarettes    Last Attempt to Quit: 01/20/2012  . Smokeless tobacco: Not on file     Comment: Pt doing E-cigarette as of 01/20/12  . Alcohol Use: No   OB History   Grav Para Term Preterm Abortions TAB SAB Ect Mult Living                 Review of Systems  Cardiovascular: Positive for chest pain.  Gastrointestinal: Positive for nausea.  All other systems reviewed and are negative.    Allergies  Review of patient's allergies indicates no known allergies.  Home Medications   Current Outpatient Rx  Name  Route  Sig  Dispense  Refill  . ALPRAZolam (XANAX) 0.5 MG tablet   Oral   Take 0.5-1 mg by mouth at bedtime as needed. For sleep or anxiety         . buPROPion (WELLBUTRIN XL) 300 MG 24 hr tablet   Oral   Take 300 mg by mouth  every morning.         . clonazePAM (KLONOPIN) 1 MG tablet   Oral   Take 1 mg by mouth 3 (three) times daily as needed. For sleep or anxiety         . levothyroxine (SYNTHROID, LEVOTHROID) 125 MCG tablet   Oral   Take 125 mcg by mouth daily before breakfast.          BP 113/84  Pulse 117  Temp(Src) 98.9 F (37.2 C) (Oral)  Resp 21  SpO2 96% Physical Exam  Nursing note and vitals reviewed. Constitutional: She is oriented to person, place, and time. She appears well-developed and well-nourished.  Slightly anxious   HENT:  Head: Normocephalic.  Mouth/Throat: Oropharynx is clear and moist.  Eyes: Conjunctivae are normal. Pupils are equal, round, and reactive to light.  Neck: Normal range of motion. Neck supple.  Cardiovascular: Regular rhythm and normal heart sounds.   Tachycardic   Pulmonary/Chest: Effort normal and breath sounds normal. No respiratory distress. She has no wheezes. She has no rales.  Abdominal: Soft. Bowel sounds are normal. She exhibits no distension. There is no tenderness. There is no rebound and no guarding.  Musculoskeletal: Normal range  of motion. She exhibits no edema and no tenderness.  Neurological: She is alert and oriented to person, place, and time.  Skin: Skin is warm and dry.  Psychiatric: She has a normal mood and affect. Her behavior is normal. Judgment and thought content normal.    ED Course  Procedures (including critical care time) Labs Reviewed  CBC WITH DIFFERENTIAL - Abnormal; Notable for the following:    WBC 17.9 (*)    Neutrophils Relative % 78 (*)    Neutro Abs 14.0 (*)    Monocytes Absolute 1.3 (*)    All other components within normal limits  COMPREHENSIVE METABOLIC PANEL - Abnormal; Notable for the following:    Glucose, Bld 159 (*)    All other components within normal limits  LIPASE, BLOOD  D-DIMER, QUANTITATIVE  POCT I-STAT TROPONIN I   Dg Chest 2 View  08/07/2012   *RADIOLOGY REPORT*  Clinical Data: Midsternal  chest pain.  Upper back pain.  CHEST - 2 VIEW  Comparison: CT chest and PA and lateral chest 10/20/2008.  Findings: Lungs are clear.  Cardiac and mediastinal contours are unremarkable.  No pneumothorax or pleural fluid.  IMPRESSION: No acute disease.   Original Report Authenticated By: Holley Dexter, M.D.   No diagnosis found.   Date: 08/07/2012  Rate: 102  Rhythm: sinus tachycardia  QRS Axis: normal  Intervals: normal  ST/T Wave abnormalities: nonspecific ST changes  Conduction Disutrbances:none  Narrative Interpretation:   Old EKG Reviewed: unchanged    MDM  Jasmine Long is a 62 y.o. female here with chest pain. Likely gastritis vs reflux. Also can be atypical ACS so will get trop x 2. She is tachycardic likely from anxiety but given SOB will get d-dimer.   10:19 PM WBC 18. She now saying that she has sinus congestion recently. I think it can contribute to her symptoms. Will prescribe a course of augmentin. D-dimer neg. I would like to still get second trop but she wants to go home. Given her age and risk factors, I am uncomfortable d/c home with one troponin. She understand risk for heart attack with possible disability and death and will sign out AMA.       Richardean Canal, MD 08/07/12 [phone]

## 2012-08-07 NOTE — ED Notes (Signed)
Pt wanting to leave AMA. States if she don't get her car she will not have a ride. Pt states she is going to come back and check in again to finish her care. Pt aware of risks and complications and possible death  of Leaving AMA.

## 2012-08-08 ENCOUNTER — Encounter (HOSPITAL_COMMUNITY): Payer: Self-pay | Admitting: *Deleted

## 2012-08-08 LAB — POCT I-STAT TROPONIN I: Troponin i, poc: 0 ng/mL (ref 0.00–0.08)

## 2012-08-08 MED ORDER — IPRATROPIUM BROMIDE 0.02 % IN SOLN
0.5000 mg | RESPIRATORY_TRACT | Status: DC
  Administered 2012-08-08: 0.5 mg via RESPIRATORY_TRACT
  Filled 2012-08-08: qty 2.5

## 2012-08-08 MED ORDER — PSEUDOEPHEDRINE HCL 60 MG PO TABS: 30.0000 mg | ORAL_TABLET | Freq: Four times a day (QID) | ORAL | Status: DC | PRN

## 2012-08-08 MED ORDER — ALBUTEROL SULFATE (5 MG/ML) 0.5% IN NEBU
2.5000 mg | INHALATION_SOLUTION | RESPIRATORY_TRACT | Status: DC
  Administered 2012-08-08: 2.5 mg via RESPIRATORY_TRACT
  Filled 2012-08-08: qty 0.5

## 2012-08-08 MED ORDER — LORATADINE 10 MG PO TABS: 10.0000 mg | ORAL_TABLET | Freq: Two times a day (BID) | ORAL | Status: DC

## 2012-08-08 NOTE — ED Notes (Signed)
Pt states she is ready to go home 

## 2012-08-08 NOTE — ED Notes (Signed)
Pt states she also took her anxiety medication prior to returning to ed

## 2012-08-08 NOTE — ED Provider Notes (Signed)
History    CSN: [id number] Arrival date & time 08/07/12  2341  First MD Initiated Contact with Patient 08/08/12 0036     Chief Complaint  Patient presents with  . Chest Pain   HPI Jasmine Long is a 62 y.o. female who presented initially to the ED with chest pain to [facility] ED and was seen by Dr. Silverio Lay. She returns for continued testing and evaluation.  Initialyy she presented with subxiphoid chest pressure pressure which started about 1 hour after eating "[employer]" with garlic sauce, something she typically doesn't eat.  She said she was looking for something fast for her family, typically doesn't eat pizza secondary to working at [employer] for  a prolonged period of time in college.  Via EMS transport, she had multiple "burps" which relieved her chest pain. She had some shortness of breath initially,but that has resolved.  The chest pain initially radiated to her back, but on this return visit it does not.  Currently, she feels much better, her pain is 1/10, has no shortness of breath, no nausea, vomiting, abdominal pain, dizziness and drove herself to the ER.    Pt was evaluated earlier but left the ER 2/2 to transportation problems.  Francis Dowse does relate a problem with chronic sinius problems, lately, not worse than usual, she hasn't tried anything lately that has helped, Claritin has not helped her symptoms, at 10mg  once daily. She recalss "Ornate   Pt continues to smoke one pack of cigarettes daily.  She was diagnosed with PTSD following her sons murder, but is not acutely depressed.  Past Medical History  Diagnosis Date  . Allergy   . Depression   . Ulcer   . Thyroid disease   . Anxiety   . Heart murmur    Past Surgical History  Procedure Laterality Date  . Cholecystectomy    . Cesarean section    . Nasal septum surgery    . Breast surgery     History reviewed. No pertinent family history. History  Substance Use Topics  . Smoking status: Current Every Day Smoker -- 1.50  packs/day for 40 years    Types: Cigarettes    Last Attempt to Quit: 01/20/2012  . Smokeless tobacco: Not on file     Comment: Pt doing E-cigarette as of 01/20/12  . Alcohol Use: No   OB History   Grav Para Term Preterm Abortions TAB SAB Ect Mult Living                 Review of Systems  Allergies  Review of patient's allergies indicates no known allergies.  Home Medications   Current Outpatient Rx  Name  Route  Sig  Dispense  Refill  . ALPRAZolam (XANAX) 0.5 MG tablet   Oral   Take 0.5-1 mg by mouth at bedtime as needed. For sleep or anxiety         . amoxicillin-clavulanate (AUGMENTIN) 875-125 MG per tablet   Oral   Take 1 tablet by mouth 2 (two) times daily. One po bid x 7 days   14 tablet   0   . buPROPion (WELLBUTRIN XL) 300 MG 24 hr tablet   Oral   Take 300 mg by mouth every morning.         . clonazePAM (KLONOPIN) 1 MG tablet   Oral   Take 1 mg by mouth 3 (three) times daily as needed. For sleep or anxiety         .  levothyroxine (SYNTHROID, LEVOTHROID) 125 MCG tablet   Oral   Take 125 mcg by mouth daily before breakfast.          BP 122/59  Pulse 105  Temp(Src) 99.7 F (37.6 C) (Oral)  Resp 16  SpO2 96% Physical Exam  Nursing notes reviewed.  Electronic medical record reviewed. VITAL SIGNS:   Filed Vitals:   08/08/12 0000 08/08/12 0125  BP: 122/59   Pulse: 105 100  Temp: 99.7 F (37.6 C)   TempSrc: Oral   Resp: 16 20  SpO2: 96% 97%   CONSTITUTIONAL: Awake, oriented x4, appears non-toxic, in no acute distress, animated, very conversant, smell of cigarettes HENT: Atraumatic, normocephalic, oral mucosa pink and moist, airway patent. Nares patent without drainage.  Nasal passages mildly boggy with yellowish drainage. External ears normal. TM's clear B/L - no effusion. EYES: Conjunctiva clear, EOMI, PERRLA NECK: Trachea midline, non-tender, supple CARDIOVASCULAR: Slightly tobeachycardic, Normal rhythm, No murmurs, rubs,  gallops PULMONARY/CHEST: Clear to auscultation, no rhonchi, wheezes, or rales. Symmetrical breath sounds. Non-tender. ABDOMINAL: Non-distended, se, soft, non-tender - no rebound or guarding.  BS normal. NEUROLOGIC: Non-focal, moving all four extremities, no gross sensory or motor deficits. EXTREMITIES: No clubbing, cyanosis, or edema SKIN: Warm, Dry, No erythema, No rash mid  ED Course  Procedures (including critical care time)  Date: 08/08/2012 @ 0014  Rate: 101  Rhythm: Sinus tachycardia  QRS Axis: normal  Intervals: normal  ST/T Wave abnormalities: normal  Conduction Disutrbances: none  Narrative Interpretation: unremarkable - no significant morphological change from prior ECG, no ST/T wave abnormality suggestive of acute ischemia or infarction   Labs Reviewed  POCT I-STAT TROPONIN I   Dg Chest 2 View  08/07/2012   *RADIOLOGY REPORT*  Clinical Data: Midsternal chest pain.  Upper back pain.  CHEST - 2 VIEW  Comparison: CT chest and PA and lateral chest 10/20/2008.  Findings: Lungs are clear.  Cardiac and mediastinal contours are unremarkable.  No pneumothorax or pleural fluid.  IMPRESSION: No acute disease.   Original Report Authenticated By: Holley Dexter, M.D.   1. Chest pain   2. Chronic allergic rhinitis   3. Indigestion    Medications - No data to display  MDM  Pt w/ central CP - atypical of cardiac CP, no change in ECG, no change in troponin at >4 hours. Based on patients well clinical appearance, normal troponins, normal ECG and negative D-Dimer, I do not suspect any ACS/PE/PTX at this time. Suspicion for PE is low, Wells of 1.5 with negative D-dimer earlier. Likewise, no suggestion of PNA or PTX, doubt pericarditis/myocarditis.  No reproducible chest pain or abdominal pain.  Doubt biliary obstruction, pancreatitis.  PT with likely esophageal spasm or transient esophageal impaction, globus sensation - no acute diagnosis evident at this time.  In reading prior EDP note,  thoughts were aligned with GI issues.  I agree with this assessment. PT feeling better and after therapy, belching.   Pt has follow up with Dr. Alanda Amass from cardiology, and again, doubt this pain is cardiac in nature.  Suspect GI source. After extensive testing and benign patient appearance (patient is very talkative, no distress) doubt cardiac or inthrathoracic emergency.  With 2 negative ECG and 2 negative Trop, think the patient patient can safely follow up with Dr. Alanda Amass.  PT has h/o sinus issues, including deviates septum and difficult to control allergic rhinitis - will advise doubleing claritin dose for now, follow up with PCP and possibly EN?t in the furture for those  symptoms.  Pt will follow up withing the next few days with PCP, Pt agrees with medical plan, feels comfortable going home, and I have answered her questions regarding follow up, treatment, and medications to her satisfaction.  Jones Skene, MD 08/08/12 1359

## 2012-08-08 NOTE — ED Notes (Signed)
Dr. Bonk at bedside 

## 2012-08-08 NOTE — ED Notes (Signed)
Pt states she ate pizza today with garlic and butter sauce first time in 5 years,  And she states she had to leave earlier [facility] because she needed her car keys,  Pt is alert and oriented reading book upon my entering room to assess her,  And then states to me "aren't you glad you came to work today" and laughs,  Pt is in NAD,  Pt being place on monitor at this time

## 2012-08-08 NOTE — ED Notes (Signed)
@   iv's attempted,  Pt didn't tolerate well,  Blood obtained,  Pt states only "baby needles" for her IV,  Pt advised if IV needed will have someone else to insert, this writer sent blood to lab and I stat to mini lab.

## 2012-08-27 ENCOUNTER — Encounter: Payer: Self-pay | Admitting: Internal Medicine

## 2012-09-13 DIAGNOSIS — Z0271 Encounter for disability determination: Secondary | ICD-10-CM

## 2012-10-21 ENCOUNTER — Telehealth: Payer: Self-pay

## 2012-10-21 NOTE — Telephone Encounter (Signed)
Pended, do you want her to use both?

## 2012-10-21 NOTE — Telephone Encounter (Signed)
Patient needs a refill on Alprazolam and Klonipin. However, patient has started a new job and her insurance hasn't started yet. Wants to know if she can get a 30 day supply being that she only takes these meds as needed. [facility]  [phone] (c) or [phone] (h)

## 2012-10-28 NOTE — Telephone Encounter (Signed)
Pt calling again to check on status of refills. She states that she has two klonopin left and one alprazolam.  Best# [phone]

## 2012-11-03 MED ORDER — ALPRAZOLAM 0.5 MG PO TABS: 0.5000 mg | ORAL_TABLET | Freq: Every evening | ORAL | Status: DC | PRN

## 2012-11-03 MED ORDER — CLONAZEPAM 1 MG PO TABS: 1.0000 mg | ORAL_TABLET | Freq: Three times a day (TID) | ORAL | Status: DC | PRN

## 2012-11-03 NOTE — Telephone Encounter (Signed)
Faxed and patient advised of need for office visit.

## 2012-11-03 NOTE — Telephone Encounter (Signed)
Overdue//but ok Meds ordered this encounter  Medications  . ALPRAZolam (XANAX) 0.5 MG tablet    Sig: Take 1-2 tablets (0.5-1 mg total) by mouth at bedtime as needed. For sleep or anxiety    Dispense:  30 tablet    Refill:  0  . clonazePAM (KLONOPIN) 1 MG tablet    Sig: Take 1 tablet (1 mg total) by mouth 3 (three) times daily as needed. For sleep or anxiety    Dispense:  30 tablet    Refill:  0

## 2012-11-03 NOTE — Telephone Encounter (Signed)
Dr. Merla Riches,  I believe this accidentally got "Doned" out of your in Basket.  Please address.

## 2013-01-13 ENCOUNTER — Other Ambulatory Visit: Payer: Self-pay | Admitting: Internal Medicine

## 2013-01-13 MED ORDER — BUPROPION HCL ER (XL) 300 MG PO TB24: 300.0000 mg | ORAL_TABLET | Freq: Every morning | ORAL | Status: DC

## 2013-01-13 MED ORDER — ALPRAZOLAM 0.5 MG PO TABS: 0.5000 mg | ORAL_TABLET | Freq: Every evening | ORAL | Status: DC | PRN

## 2013-01-13 MED ORDER — CLONAZEPAM 1 MG PO TABS: 1.0000 mg | ORAL_TABLET | Freq: Three times a day (TID) | ORAL | Status: DC | PRN

## 2013-03-01 ENCOUNTER — Ambulatory Visit: Payer: Self-pay | Admitting: Internal Medicine

## 2013-03-01 VITALS — BP 134/88 | HR 99 | Temp 99.5°F | Resp 18 | Ht 67.75 in | Wt 278.8 lb

## 2013-03-01 DIAGNOSIS — F411 Generalized anxiety disorder: Secondary | ICD-10-CM

## 2013-03-01 DIAGNOSIS — I10 Essential (primary) hypertension: Secondary | ICD-10-CM

## 2013-03-01 DIAGNOSIS — E039 Hypothyroidism, unspecified: Secondary | ICD-10-CM

## 2013-03-01 DIAGNOSIS — F17200 Nicotine dependence, unspecified, uncomplicated: Secondary | ICD-10-CM

## 2013-03-01 DIAGNOSIS — F418 Other specified anxiety disorders: Secondary | ICD-10-CM

## 2013-03-01 DIAGNOSIS — Z6841 Body Mass Index (BMI) 40.0 and over, adult: Secondary | ICD-10-CM

## 2013-03-01 MED ORDER — LEVOTHYROXINE SODIUM 125 MCG PO TABS: 125.0000 ug | ORAL_TABLET | Freq: Every day | ORAL | Status: DC

## 2013-03-01 MED ORDER — CLONAZEPAM 1 MG PO TABS: 1.0000 mg | ORAL_TABLET | Freq: Every day | ORAL | Status: DC

## 2013-03-01 MED ORDER — ALPRAZOLAM 0.5 MG PO TABS: 0.5000 mg | ORAL_TABLET | Freq: Two times a day (BID) | ORAL | Status: DC | PRN

## 2013-03-01 MED ORDER — BUPROPION HCL ER (XL) 300 MG PO TB24: 300.0000 mg | ORAL_TABLET | Freq: Every morning | ORAL | Status: DC

## 2013-03-01 NOTE — Progress Notes (Signed)
Subjective:    Patient ID: Jasmine Long, female    DOB: 11/11/1950, 63 y.o.   MRN: [id number]  HPIwants to lose weight--eating for comfort//atkins worked before  [employer] exer due to rec injury  Needs psych meds ref  Patient Active Problem List   Diagnosis Date Noted  . Hypothyroid 04/29/2011  . BMI 40.0-44.9, adult 04/29/2011  . Depression with anxiety-----this week is the anniversary of her son's murder and she is more anxious than usual and since using benzodiazepines on a daily basis  Klonopin puts her to sleep /Xanax helps with daytime anxiety   04/29/2011  . GERD (gastroesophageal reflux disease) 04/29/2011  . 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  . Nicotine addiction 04/29/2011  . Hypertension----off meds and ok recently 04/29/2011   Current outpatient prescriptions:ALPRAZolam (XANAX) 0.5 MG tablet, Take 1-2 tablets (0.5-1 mg total) by mouth 2 (two) times daily as needed for anxiety., Disp: 30 tablet, Rfl: 5;  buPROPion (WELLBUTRIN XL) 300 MG 24 hr tablet, Take 1 tablet (300 mg total) by mouth every morning., Disp: 30 tablet, Rfl: 5;  clonazePAM (KLONOPIN) 1 MG tablet, Take 1 tablet (1 mg total) by mouth at bedtime. For sleep or anxiety, Disp: 30 tablet, Rfl: 5 levothyroxine (SYNTHROID, LEVOTHROID) 125 MCG tablet, Take 1 tablet (125 mcg total) by mouth daily before breakfast., Disp: 90 tablet, Rfl: 1;  loratadine (CLARITIN) 10 MG tablet, Take 1 tablet (10 mg total) by mouth 2 (two) times daily., Disp: 30 tablet, Rfl: 0  Currently uninsured and does not wish to proceed with any lab work  Review of Systems Noncontributory    Objective:   Physical Exam BP 134/88  Pulse 99  Temp(Src) 99.5 F (37.5 C) (Oral)  Resp 18  Ht 5' 7.75" (1.721 m)  Wt 278 lb 12.8 oz (126.463 kg)  BMI 42.70 kg/m2  SpO2 97% Stable       Assessment & Plan:  Depression with anxiety----no change in may of  BMI 40.0-44.9, adult------ to start Renaldo Fiddler  Nicotine addiction--- discussed cessation  again  Hypertension  Unspecified hypothyroidism - Plan: levothyroxine (SYNTHROID, LEVOTHROID) 125 MCG tablet  Meds ordered this encounter  Medications  . buPROPion (WELLBUTRIN XL) 300 MG 24 hr tablet    Sig: Take 1 tablet (300 mg total) by mouth every morning.    Dispense:  30 tablet    Refill:  5  . clonazePAM (KLONOPIN) 1 MG tablet    Sig: Take 1 tablet (1 mg total) by mouth at bedtime. For sleep or anxiety    Dispense:  30 tablet    Refill:  5  . ALPRAZolam (XANAX) 0.5 MG tablet    Sig: Take 1-2 tablets (0.5-1 mg total) by mouth 2 (two) times daily as needed for anxiety.    Dispense:  30 tablet    Refill:  5  . levothyroxine (SYNTHROID, LEVOTHROID) 125 MCG tablet    Sig: Take 1 tablet (125 mcg total) by mouth daily before breakfast.    Dispense:  90 tablet    Refill:  1   Followup for retesting of thyroid, health maintenance items, either Lantus

## 2013-05-09 ENCOUNTER — Ambulatory Visit: Payer: Self-pay

## 2013-06-10 ENCOUNTER — Telehealth: Payer: Self-pay

## 2013-06-10 NOTE — Telephone Encounter (Signed)
Dr. Merla Richesoolittle   Patient is upset with [facility].  She has blood in her urine   She would like to see another MD at same place, different doctor.    [phone]

## 2013-06-13 NOTE — Telephone Encounter (Signed)
Pt is a workers comp patient. She has to go to this doctor per her workers [employer].

## 2013-08-07 ENCOUNTER — Other Ambulatory Visit: Payer: Self-pay | Admitting: Internal Medicine

## 2013-09-11 ENCOUNTER — Other Ambulatory Visit: Payer: Self-pay | Admitting: Internal Medicine

## 2013-09-13 ENCOUNTER — Telehealth: Payer: Self-pay

## 2013-09-13 NOTE — Telephone Encounter (Signed)
Patient requesting a refill on "Bupropion XL 300mg  24 tabs". Patient submitted for a refill this past Saturday with [facility] and has not heard anything. Patient leaving to go out of town on Friday august 21. [facility] [address] [phone]. Patients call back number is [phone].

## 2013-09-14 ENCOUNTER — Telehealth: Payer: Self-pay

## 2013-09-14 NOTE — Telephone Encounter (Signed)
Noted  

## 2013-09-14 NOTE — Telephone Encounter (Signed)
Refill was sent to the pharmacy. Spoke to pt- needs follow up. Transferred pt to schedule this appt.

## 2013-09-14 NOTE — Telephone Encounter (Signed)
She wanted to let Maralyn SagoSarah know that she got an appt with Dr Merla Richesoolittle on Sept 30 @11 :30. Thank you

## 2013-10-25 ENCOUNTER — Ambulatory Visit (INDEPENDENT_AMBULATORY_CARE_PROVIDER_SITE_OTHER): Payer: Self-pay | Admitting: Internal Medicine

## 2013-10-25 ENCOUNTER — Encounter: Payer: Self-pay | Admitting: Internal Medicine

## 2013-10-25 VITALS — BP 144/82 | HR 89 | Temp 98.2°F | Resp 16 | Ht 68.0 in | Wt 279.0 lb

## 2013-10-25 DIAGNOSIS — F341 Dysthymic disorder: Secondary | ICD-10-CM

## 2013-10-25 DIAGNOSIS — F418 Other specified anxiety disorders: Secondary | ICD-10-CM

## 2013-10-25 DIAGNOSIS — F17211 Nicotine dependence, cigarettes, in remission: Secondary | ICD-10-CM

## 2013-10-25 DIAGNOSIS — Z87891 Personal history of nicotine dependence: Secondary | ICD-10-CM

## 2013-10-25 DIAGNOSIS — E039 Hypothyroidism, unspecified: Secondary | ICD-10-CM

## 2013-10-25 DIAGNOSIS — I10 Essential (primary) hypertension: Secondary | ICD-10-CM

## 2013-10-25 DIAGNOSIS — E038 Other specified hypothyroidism: Secondary | ICD-10-CM

## 2013-10-25 MED ORDER — CLONAZEPAM 1 MG PO TABS: 1.0000 mg | ORAL_TABLET | Freq: Every day | ORAL | Status: DC

## 2013-10-25 MED ORDER — ALPRAZOLAM 0.5 MG PO TABS: 0.5000 mg | ORAL_TABLET | Freq: Two times a day (BID) | ORAL | Status: DC | PRN

## 2013-10-25 MED ORDER — LOSARTAN POTASSIUM 50 MG PO TABS: ORAL_TABLET | ORAL | Status: DC

## 2013-10-25 MED ORDER — BUPROPION HCL ER (XL) 300 MG PO TB24: 300.0000 mg | ORAL_TABLET | Freq: Every day | ORAL | Status: DC

## 2013-10-25 NOTE — Progress Notes (Signed)
IDENTIFYING INFORMATION  Jasmine Long / female / 11/12/1950 / 63 y.o. / MRN: [id number]  SUBJECTIVE  Chief Complaint: had a chief complaint of Medication Refill.  History of present illness: Patient presents with need to refill her BP medication as well as her depression and anxiety medication.  She reports that her BP has been well controlled since her last visit.  She denies changes in vision, HA, chest pain and leg edema.  She reports that her depression symptoms are mildly worse over the last month, and feels that it may due to family stress, as well as the approaching anniversary of her sons death in [id number].  She reports increases in tearfulness, anhedonia, increases in appetite as well as weight gain.  She denies suicidality and changes in attention.     The problem list, allergies, medications, family, surgical, and social history were reviewed by me and exist elsewhere in the encounter.    Review of Systems  Constitutional:       Weight gain  HENT: Negative.   Eyes: Negative.   Respiratory: Negative.   Cardiovascular: Negative for chest pain and palpitations.  Gastrointestinal: Negative.   Musculoskeletal: Positive for joint pain and myalgias.  Skin: Negative.   Neurological: Positive for tingling (In the lower extremities ).  Psychiatric/Behavioral: Positive for depression. Negative for suicidal ideas. The patient is nervous/anxious.     OBJECTIVE  Blood pressure 144/82, pulse 89, temperature 98.2 F (36.8 C), resp. rate 16, height 5\' 8"  (1.727 m), weight 279 lb (126.554 kg), SpO2 95.00%.  Physical Exam  Constitutional: She is oriented to person, place, and time and well-developed, well-nourished, and in no distress.  HENT:  Head: Normocephalic.  Eyes: EOM are normal.  Neck: Normal range of motion. No thyromegaly present.  Cardiovascular: Normal rate and regular rhythm.   Pulmonary/Chest: Effort normal and breath sounds normal. She has no wheezes. She has no rales.    Neurological: She is alert and oriented to person, place, and time. Gait normal.  Skin: Skin is warm and dry.  Psychiatric: Memory, affect and judgment normal. Her mood appears not anxious. She exhibits a depressed mood. She expresses no homicidal and no suicidal ideation. She expresses no suicidal plans and no homicidal plans.  She is tearful on exam when referencing her son.      ASSESSMENT & PLAN  Unspecified hypothyroidism - Plan: T4, Free, TSH.  We await lab results before refilling her Synthroid.    Essential hypertension - Plan: CBC, COMPLETE METABOLIC PANEL WITH GFR - Stable, continue current plan.  Depression with anxiety: The patient's history is consistent with an increase in dysthymic symptoms. We await the results of her Thyroid panel before making changes to her psychiatric medicine.  However, it is also possible that once the anniversary of her son's death passes her her symptoms may return to baseline.  She was instructed to call the clinic should her symptoms not improve over the next few months.(?incr wellbutr to 450). Unfortunately she continues to defer therapy.  Elevated BMI-- needs both dietary change and consistent exercise/is not committed to this at this point.   The patient was instructed to to call or comeback to clinic in 6 mos, or should symptoms warrant.  Deliah BostonMichael Shamal Stracener, MS, PA-C [facility] 10/25/2013 1:21 PM  I have completed the patient encounter in its entirety as documented by PA Jayonna Meyering, with editing by me where necessary. Robert P. Merla Richesoolittle, M.D.

## 2013-10-30 ENCOUNTER — Telehealth: Payer: Self-pay | Admitting: Family Medicine

## 2013-10-30 DIAGNOSIS — E039 Hypothyroidism, unspecified: Secondary | ICD-10-CM

## 2013-10-30 LAB — COMPLETE METABOLIC PANEL WITH GFR
ALT: 18 U/L (ref 0–35)
AST: 14 U/L (ref 0–37)
Albumin: 4.4 g/dL (ref 3.5–5.2)
Alkaline Phosphatase: 98 U/L (ref 39–117)
BUN: 14 mg/dL (ref 6–23)
CO2: 25 mEq/L (ref 19–32)
Calcium: 9.5 mg/dL (ref 8.4–10.5)
Chloride: 102 mEq/L (ref 96–112)
Creat: 0.98 mg/dL (ref 0.50–1.10)
GFR, EST NON AFRICAN AMERICAN: 62 mL/min
GFR, Est African American: 72 mL/min
GLUCOSE: 167 mg/dL — AB (ref 70–99)
Potassium: 4.5 mEq/L (ref 3.5–5.3)
Sodium: 135 mEq/L (ref 135–145)
TOTAL PROTEIN: 7.2 g/dL (ref 6.0–8.3)
Total Bilirubin: 0.5 mg/dL (ref 0.2–1.2)

## 2013-10-30 LAB — CBC
HCT: 44.6 % (ref 36.0–46.0)
HEMOGLOBIN: 14.6 g/dL (ref 12.0–15.0)
MCH: 29.1 pg (ref 26.0–34.0)
MCHC: 32.7 g/dL (ref 30.0–36.0)
MCV: 88.8 fL (ref 78.0–100.0)
PLATELETS: 394 10*3/uL (ref 150–400)
RBC: 5.02 MIL/uL (ref 3.87–5.11)
RDW: 14.2 % (ref 11.5–15.5)
WBC: 8.6 10*3/uL (ref 4.0–10.5)

## 2013-10-30 LAB — T4, FREE: Free T4: 0.89 ng/dL (ref 0.80–1.80)

## 2013-10-30 LAB — TSH: TSH: 1.333 u[IU]/mL (ref 0.350–4.500)

## 2013-10-30 NOTE — Telephone Encounter (Signed)
Dr Patient states that she is out of her Thyroid medicine and would like a refill

## 2013-11-01 MED ORDER — LEVOTHYROXINE SODIUM 125 MCG PO TABS: 125.0000 ug | ORAL_TABLET | Freq: Every day | ORAL | Status: DC

## 2013-11-01 NOTE — Telephone Encounter (Signed)
Pt came in on Monday and had blood work completed. She states she is still really tired and feels that the dose needs to increase.

## 2013-11-01 NOTE — Telephone Encounter (Signed)
Labs show tsh nl Meds ordered this encounter  Medications  . levothyroxine (SYNTHROID, LEVOTHROID) 125 MCG tablet    Sig: Take 1 tablet (125 mcg total) by mouth daily before breakfast.    Dispense:  90 tablet    Refill:  3   Will need hgb A1C

## 2013-11-01 NOTE — Telephone Encounter (Signed)
At last OV she was to get tsh free t4 done before refills and was given a future order

## 2013-11-02 ENCOUNTER — Telehealth: Payer: Self-pay | Admitting: Physician Assistant

## 2013-11-02 NOTE — Telephone Encounter (Signed)
Spoke with pt. She will RTC for an A1C in a week or 2.

## 2013-11-03 NOTE — Telephone Encounter (Signed)
Left message for patient regarding normal labs.  Did inform her of her elevated blood glucose on CMET.  Also made her aware that this was brought to Dr. Netta Corriganoolittle's attention.    Deliah BostonMichael Clark, MS, PA-C 4:31 PM

## 2013-11-21 ENCOUNTER — Other Ambulatory Visit: Payer: Self-pay | Admitting: Physician Assistant

## 2013-11-21 ENCOUNTER — Other Ambulatory Visit: Payer: Self-pay | Admitting: Internal Medicine

## 2013-11-21 ENCOUNTER — Telehealth: Payer: Self-pay

## 2013-11-21 NOTE — Telephone Encounter (Signed)
Pt states Dr. Merla Richesoolittle forgot to give her refills on all five of her medicines They are  ALPRAZOLAM 0.5 , BUPROPION 300 MGS, LEVOTHYROXINE 125MG S, CLONAZEPAM 1 MGS, LOSARTAN 50 MGS     [facility]

## 2013-11-27 NOTE — Telephone Encounter (Signed)
They were all done at alst ov acc to chart

## 2013-12-15 ENCOUNTER — Telehealth: Payer: Self-pay

## 2013-12-15 NOTE — Telephone Encounter (Signed)
Patient left voicemail stating [facility] was to send us a fax regarding a penicillin rx. She has a heart murmer and they want her to take the med before she has her teeth cleaned. Her heart doctor is Dr. Deirdre PippinsWeintrob and her PCP is Dr. Merla Richesoolittle. Cb# [id number]. Please forward fax to Dr. Merla Richesoolittle or clinical staff.

## 2013-12-15 NOTE — Telephone Encounter (Signed)
Fax received and placed in [facility] box.

## 2013-12-18 ENCOUNTER — Telehealth: Payer: Self-pay

## 2013-12-18 NOTE — Telephone Encounter (Signed)
Cardiac eval at ED last year and murmer no longer there--not diagnosed with structural heart disease---(no longer needs antibiotics if has problem like Mitral valve prolapse)

## 2013-12-18 NOTE — Telephone Encounter (Signed)
Patient normally used amoxicillin when she goes to the dentist. She says the dental clinic told her Dr  Merla Richesoolittle says she does not need it this time. Patient wants to verify this information and have an understanding as to why it is not needed this time

## 2013-12-19 NOTE — Telephone Encounter (Signed)
Pt advised she does not have to use abx.

## 2014-02-22 ENCOUNTER — Telehealth: Payer: Self-pay

## 2014-02-22 NOTE — Telephone Encounter (Signed)
Jasmine Long wants Dr. Merla Richesoolittle to know her orthopedic Doctor has prescribed Lyrica 50mg s/2x's a day. She needs to know if there will be any adverse reactions with the medications she is currently taking now. Please advise [phone]

## 2014-02-22 NOTE — Telephone Encounter (Signed)
No problems 

## 2014-02-23 NOTE — Telephone Encounter (Signed)
Spoke with pt, advised Dr. Merla Richesoolittle states no problems. Pt understood.

## 2014-05-08 ENCOUNTER — Other Ambulatory Visit (HOSPITAL_COMMUNITY): Payer: Self-pay | Admitting: Physical Medicine and Rehabilitation

## 2014-05-08 DIAGNOSIS — M5442 Lumbago with sciatica, left side: Secondary | ICD-10-CM

## 2014-05-11 ENCOUNTER — Encounter (HOSPITAL_COMMUNITY): Payer: Self-pay

## 2014-05-11 ENCOUNTER — Ambulatory Visit (HOSPITAL_COMMUNITY): Payer: Self-pay

## 2014-05-14 ENCOUNTER — Encounter (HOSPITAL_COMMUNITY)
Admission: RE | Admit: 2014-05-14 | Discharge: 2014-05-14 | Disposition: A | Discharge status: Home / Self Care | Payer: Worker's Compensation | Source: Ambulatory Visit | Attending: Physical Medicine and Rehabilitation | Admitting: Physical Medicine and Rehabilitation

## 2014-05-14 DIAGNOSIS — M5442 Lumbago with sciatica, left side: Secondary | ICD-10-CM

## 2014-05-14 MED ORDER — TECHNETIUM TC 99M MEDRONATE IV KIT
25.0000 | PACK | Freq: Once | INTRAVENOUS | Status: AC | PRN
Start: 2014-05-14 — End: 2014-05-14
  Administered 2014-05-14: 25 via INTRAVENOUS

## 2014-06-09 ENCOUNTER — Telehealth: Payer: Self-pay

## 2014-06-09 NOTE — Telephone Encounter (Signed)
Can we refill alprazolam and clonazepam for pt? Sending this to PA because looks like pt has seen Dr Merla Richesoolittle and he is OOT.

## 2014-06-09 NOTE — Telephone Encounter (Signed)
The patient called to request refills of Alprazolam and Clonazepam.  The patient is leaving on Wednesday, Jun 13, 2014 for [place] beach for two weeks, and when she called the pharmacy to get refills of her medications she was told that the prescriptions for these two medications had expired.  The patient is concerned that she will have a panic attack while away and only has 2 Alprazolam left.  The patient stated she has 8 Clonazepam left as well.  The patient may be reached at [phone] with any questions.  Marking this high priority since the patient will be leaving on 06/13/14.

## 2014-06-11 MED ORDER — CLONAZEPAM 1 MG PO TABS: 1.0000 mg | ORAL_TABLET | Freq: Every day | ORAL | Status: DC

## 2014-06-11 MED ORDER — ALPRAZOLAM 0.5 MG PO TABS: 0.5000 mg | ORAL_TABLET | Freq: Two times a day (BID) | ORAL | Status: DC | PRN

## 2014-06-11 NOTE — Telephone Encounter (Signed)
Meds ordered this encounter  Medications  . ALPRAZolam (XANAX) 0.5 MG tablet    Sig: Take 1-2 tablets (0.5-1 mg total) by mouth 2 (two) times daily as needed for anxiety.    Dispense:  30 tablet    Refill:  0    Order Specific Question:  Supervising Provider    Answer:  DOOLITTLE, ROBERT P [[id number]]  . clonazePAM (KLONOPIN) 1 MG tablet    Sig: Take 1 tablet (1 mg total) by mouth at bedtime. For sleep or anxiety    Dispense:  30 tablet    Refill:  0    Order Specific Question:  Supervising Provider    Answer:  DOOLITTLE, ROBERT P [[id number]]

## 2014-06-11 NOTE — Telephone Encounter (Signed)
Rx faxed

## 2014-07-06 ENCOUNTER — Encounter: Payer: Self-pay | Admitting: *Deleted

## 2014-08-15 ENCOUNTER — Other Ambulatory Visit: Payer: Self-pay

## 2014-08-15 MED ORDER — ALPRAZOLAM 0.5 MG PO TABS: 0.5000 mg | ORAL_TABLET | Freq: Two times a day (BID) | ORAL | Status: DC | PRN

## 2014-08-15 MED ORDER — CLONAZEPAM 1 MG PO TABS: 1.0000 mg | ORAL_TABLET | Freq: Every day | ORAL | Status: DC

## 2014-08-15 NOTE — Telephone Encounter (Signed)
Also requesting clonazepam.

## 2014-08-15 NOTE — Telephone Encounter (Signed)
Pharm reqs RF of alprazolam. Pt hasn't been seen since last Sept.

## 2014-08-15 NOTE — Telephone Encounter (Signed)
Ok then needs f/u before further meds Meds ordered this encounter  Medications  . ALPRAZolam (XANAX) 0.5 MG tablet    Sig: Take 1-2 tablets (0.5-1 mg total) by mouth 2 (two) times daily as needed for anxiety.    Dispense:  30 tablet    Refill:  0  . clonazePAM (KLONOPIN) 1 MG tablet    Sig: Take 1 tablet (1 mg total) by mouth at bedtime. For sleep or anxiety    Dispense:  30 tablet    Refill:  0

## 2014-08-16 NOTE — Telephone Encounter (Signed)
Rxs faxed to the pharmacy

## 2014-08-17 NOTE — Telephone Encounter (Signed)
Notified pt of RFs and need for appt. She couldn't believe it had been so long since she was in. Transferred to Scheduling.

## 2014-08-24 ENCOUNTER — Telehealth: Payer: Self-pay

## 2014-08-24 NOTE — Telephone Encounter (Signed)
Patient requested a refill for aprozolam and colazepam and they were refilled on the 20th.  They are no where to be found and the pharmacy never received them. Please fax to [facility]! Patient is completely out. Please call when ready! [phone]

## 2014-08-24 NOTE — Telephone Encounter (Signed)
Sent in refills. Pt notified on her voicemail.

## 2014-09-19 ENCOUNTER — Encounter: Payer: Self-pay | Admitting: Internal Medicine

## 2014-09-19 ENCOUNTER — Ambulatory Visit (INDEPENDENT_AMBULATORY_CARE_PROVIDER_SITE_OTHER): Payer: Self-pay | Admitting: Internal Medicine

## 2014-09-19 VITALS — BP 125/85 | HR 110 | Temp 99.6°F | Resp 16 | Ht 67.5 in

## 2014-09-19 DIAGNOSIS — R739 Hyperglycemia, unspecified: Secondary | ICD-10-CM

## 2014-09-19 DIAGNOSIS — E039 Hypothyroidism, unspecified: Secondary | ICD-10-CM

## 2014-09-19 DIAGNOSIS — F418 Other specified anxiety disorders: Secondary | ICD-10-CM

## 2014-09-19 DIAGNOSIS — Z6841 Body Mass Index (BMI) 40.0 and over, adult: Secondary | ICD-10-CM

## 2014-09-19 DIAGNOSIS — I10 Essential (primary) hypertension: Secondary | ICD-10-CM

## 2014-09-19 LAB — LIPID PANEL
Cholesterol: 212 mg/dL — ABNORMAL HIGH (ref 125–200)
HDL: 43 mg/dL — ABNORMAL LOW (ref >=46)
LDL Cholesterol: 145 mg/dL — ABNORMAL HIGH (ref <130)
TRIGLYCERIDES: 122 mg/dL (ref <150)
Total CHOL/HDL Ratio: 4.9 Ratio (ref <=5.0)
VLDL: 24 mg/dL (ref <30)

## 2014-09-19 LAB — TSH: TSH: 1.232 u[IU]/mL (ref 0.350–4.500)

## 2014-09-19 LAB — POCT GLYCOSYLATED HEMOGLOBIN (HGB A1C): Hemoglobin A1C: 7.1

## 2014-09-19 LAB — T4, FREE: FREE T4: 1.12 ng/dL (ref 0.80–1.80)

## 2014-09-19 MED ORDER — LEVOTHYROXINE SODIUM 125 MCG PO TABS: 125.0000 ug | ORAL_TABLET | Freq: Every day | ORAL | Status: DC

## 2014-09-19 MED ORDER — LOSARTAN POTASSIUM 50 MG PO TABS: 50.0000 mg | ORAL_TABLET | Freq: Every day | ORAL | Status: DC

## 2014-09-19 MED ORDER — CLONAZEPAM 1 MG PO TABS: 1.0000 mg | ORAL_TABLET | Freq: Every day | ORAL | Status: DC

## 2014-09-19 MED ORDER — ALPRAZOLAM 0.5 MG PO TABS: 0.5000 mg | ORAL_TABLET | Freq: Two times a day (BID) | ORAL | Status: DC | PRN

## 2014-09-19 MED ORDER — BUPROPION HCL ER (XL) 300 MG PO TB24: 300.0000 mg | ORAL_TABLET | Freq: Every day | ORAL | Status: DC

## 2014-09-19 NOTE — Progress Notes (Signed)
Subjective:    Patient ID: Jasmine Long, female    DOB: Nov 13, 1950, 64 y.o.   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 HAs /numbness arms dr Ethelene Hal ongoing treatment  Review of Systems Noncontributory    Objective:   Physical Exam BP 125/85 mmHg  Pulse 110  Temp(Src) 99.6 F (37.6 C) (Oral)  Resp 16  Ht 5' 7.5" (1.715 m)  Wt  No thyromegaly PERRLA Heart regular No peripheral edema/good peripheral pulses   Results for orders placed or performed in visit on 09/19/14  TSH  Result Value Ref Range   TSH 1.232 0.350 - 4.500 uIU/mL  T4, free  Result Value Ref Range   Free T4 1.12 0.80 - 1.80 ng/dL  Lipid panel  Result Value Ref Range   Cholesterol 212 (H) 125 - 200 mg/dL   Triglycerides 841 <324 mg/dL   HDL 43 (L) >=40 mg/dL   Total CHOL/HDL Ratio 4.9 <=5.0 Ratio   VLDL 24 <30 mg/dL   LDL Cholesterol 102 (H) <130 mg/dL  POCT glycosylated hemoglobin (Hb A1C)  Result Value Ref Range   Hemoglobin A1C 7.1    I did convince her to go ahead with testing as noted above because of the evidence in her past chart that she has significant problems    Assessment & Plan:  Hypothyroidism, unspecified hypothyroidism type - Plan: TSH, T4, free--no need for medication change  Essential hypertension - Plan: CANCELED: CBC with Differential/Platelet  Depression with anxiety--- stable for now on Wellbutrin// Klonopin for sleep//daily if she has panic (her son was murdered and this is still a problem for her)  BMI 40.0-44.9, adult  Hyperglycemia - Plan: POCT glycosylated hemoglobin (Hb A1C) indicates type 2 diabetes  -She refuses to start medication -She is given advice regarding diet and weight loss and promises she will repeat this test in 6 months -Hyperlipidemia noted in  addition/unwilling to start medication  Need for hepatitis C screening test - Plan: CANCELED: Hepatitis C antibody--she declines  Screening for HIV (human immunodeficiency virus) - Plan: CANCELED: HIV antibody----she declines  Meds ordered this encounter  Medications  . levothyroxine (SYNTHROID, LEVOTHROID) 125 MCG tablet    Sig: Take 1 tablet (125 mcg total) by mouth daily before breakfast.    Dispense:  90 tablet    Refill:  3  . losartan (COZAAR) 50 MG tablet    Sig: Take 1 tablet (50 mg total) by mouth daily.    Dispense:  90 tablet    Refill:  3  . clonazePAM (KLONOPIN) 1 MG tablet    Sig: Take 1 tablet (1 mg total) by mouth at bedtime. For sleep or anxiety    Dispense:  30 tablet    Refill:  5  . buPROPion (WELLBUTRIN XL) 300 MG 24 hr tablet    Sig: Take 1 tablet (300 mg total) by mouth daily.    Dispense:  90 tablet    Refill:  3  . ALPRAZolam (XANAX) 0.5 MG tablet    Sig: Take 1-2 tablets (0.5-1 mg total) by mouth 2 (two) times daily as needed for anxiety.    Dispense:  30 tablet    Refill:  2

## 2014-09-23 ENCOUNTER — Encounter: Payer: Self-pay | Admitting: Internal Medicine

## 2014-10-26 ENCOUNTER — Telehealth: Payer: Self-pay

## 2014-10-26 NOTE — Telephone Encounter (Signed)
Patient is seeing Dr. Ethelene Hal at [facility] for a back injury. Patient states that they are trying to prescribe her hydro/oxycodone and she doesn't want to take those kinds of drugs. Patient is trying to figure out a medication that Dr. Merla Riches once prescribed in late 2014 her for never pain so that she can request it from the provider she's currently seeing. Please call! [phone]

## 2014-10-29 NOTE — Telephone Encounter (Signed)
Looked for chart to pull, didn't see it, have you already gotten her paper chart?

## 2014-10-29 NOTE — Telephone Encounter (Signed)
Pull paper chart for me please. Thanks.

## 2014-10-29 NOTE — Telephone Encounter (Signed)
Naprosyn 2014 Also few doses percocet 2014 Dr Ethelene Hal will know more about alternatives

## 2014-10-29 NOTE — Telephone Encounter (Signed)
Dr. Merla Riches, do you remember which medication she is referring to?

## 2014-10-31 NOTE — Telephone Encounter (Signed)
Left message with Dr. Netta Corrigan message.

## 2014-12-24 ENCOUNTER — Encounter: Payer: Self-pay | Admitting: Internal Medicine

## 2014-12-25 ENCOUNTER — Encounter: Payer: Self-pay | Admitting: Internal Medicine

## 2014-12-26 ENCOUNTER — Other Ambulatory Visit: Payer: Self-pay | Admitting: Neurology

## 2014-12-26 DIAGNOSIS — R519 Headache, unspecified: Secondary | ICD-10-CM

## 2014-12-26 DIAGNOSIS — R51 Headache: Principal | ICD-10-CM

## 2015-01-08 ENCOUNTER — Ambulatory Visit
Admission: RE | Admit: 2015-01-08 | Discharge: 2015-01-08 | Disposition: A | Discharge status: Home / Self Care | Payer: Worker's Compensation | Source: Ambulatory Visit | Attending: Neurology | Admitting: Neurology

## 2015-01-08 DIAGNOSIS — R51 Headache: Principal | ICD-10-CM

## 2015-01-08 DIAGNOSIS — R519 Headache, unspecified: Secondary | ICD-10-CM

## 2015-01-16 ENCOUNTER — Encounter: Payer: Self-pay | Admitting: Internal Medicine

## 2015-01-16 ENCOUNTER — Ambulatory Visit (INDEPENDENT_AMBULATORY_CARE_PROVIDER_SITE_OTHER): Payer: 59 | Admitting: Internal Medicine

## 2015-01-16 VITALS — BP 165/83 | HR 86 | Temp 98.7°F | Resp 16 | Ht 68.0 in | Wt 275.0 lb

## 2015-01-16 DIAGNOSIS — Z6841 Body Mass Index (BMI) 40.0 and over, adult: Secondary | ICD-10-CM

## 2015-01-16 DIAGNOSIS — R011 Cardiac murmur, unspecified: Secondary | ICD-10-CM

## 2015-01-16 DIAGNOSIS — F418 Other specified anxiety disorders: Secondary | ICD-10-CM

## 2015-01-16 DIAGNOSIS — F17211 Nicotine dependence, cigarettes, in remission: Secondary | ICD-10-CM

## 2015-01-16 DIAGNOSIS — R739 Hyperglycemia, unspecified: Secondary | ICD-10-CM

## 2015-01-16 DIAGNOSIS — E038 Other specified hypothyroidism: Secondary | ICD-10-CM

## 2015-01-16 DIAGNOSIS — K219 Gastro-esophageal reflux disease without esophagitis: Secondary | ICD-10-CM

## 2015-01-16 DIAGNOSIS — I10 Essential (primary) hypertension: Secondary | ICD-10-CM

## 2015-01-16 LAB — POCT GLYCOSYLATED HEMOGLOBIN (HGB A1C): Hemoglobin A1C: 7.2

## 2015-01-16 MED ORDER — METFORMIN HCL 500 MG PO TABS: 500.0000 mg | ORAL_TABLET | Freq: Two times a day (BID) | ORAL | Status: DC

## 2015-01-16 MED ORDER — ATORVASTATIN CALCIUM 20 MG PO TABS: 20.0000 mg | ORAL_TABLET | Freq: Every day | ORAL | Status: DC

## 2015-01-16 NOTE — Patient Instructions (Signed)
Whole 30 diet 
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
## 2015-01-18 MED ORDER — LOSARTAN POTASSIUM 100 MG PO TABS: 100.0000 mg | ORAL_TABLET | Freq: Every day | ORAL | Status: DC

## 2015-03-20 ENCOUNTER — Other Ambulatory Visit: Payer: Self-pay

## 2015-03-20 NOTE — Telephone Encounter (Signed)
PT REQ. REFILL FOR.... SHE STATES SHE IS DOWN TO ONLY ONE... EXPLAINED OV REQUIREMENTS... PT SAY'S HER PCP HAS A DIFFERENT CARE PLAN SO SHE NEEDS NO OV/// UPCOMING OV IN 04/2015   Original Order:  ALPRAZolam Prudy Feeler) 0.5 MG tablet [[id number]]         Pharmacy:  [facility] # [address] [place] - [address]        Original Order:  clonazePAM (KLONOPIN) 1 MG tablet [[id number]]         Pharmacy:  [facility] # [address] [place] - [address]        [phone] PLEASE CALL TO ADVISE IF NEC.

## 2015-03-22 ENCOUNTER — Other Ambulatory Visit: Payer: Self-pay

## 2015-03-22 MED ORDER — CLONAZEPAM 1 MG PO TABS: 1.0000 mg | ORAL_TABLET | Freq: Every day | ORAL | Status: DC

## 2015-03-22 MED ORDER — ALPRAZOLAM 0.5 MG PO TABS: 0.5000 mg | ORAL_TABLET | Freq: Two times a day (BID) | ORAL | Status: DC | PRN

## 2015-03-22 NOTE — Telephone Encounter (Signed)
Pharm reqs RFs of clonazepam. Pended. 

## 2015-03-22 NOTE — Telephone Encounter (Signed)
Called in. Pt notified on VM.

## 2015-03-22 NOTE — Telephone Encounter (Signed)
Dr Merla Riches already RFd her clonazepam, so only pending the alprazolam.

## 2015-03-22 NOTE — Telephone Encounter (Signed)
Called in. Pt notified on Vm.

## 2015-03-30 ENCOUNTER — Other Ambulatory Visit: Payer: Self-pay | Admitting: Physician Assistant

## 2015-04-09 ENCOUNTER — Other Ambulatory Visit: Payer: Self-pay | Admitting: Physician Assistant

## 2015-05-15 ENCOUNTER — Encounter: Payer: Self-pay | Admitting: Internal Medicine

## 2015-05-15 ENCOUNTER — Ambulatory Visit (INDEPENDENT_AMBULATORY_CARE_PROVIDER_SITE_OTHER): Payer: Self-pay | Admitting: Internal Medicine

## 2015-05-15 VITALS — BP 136/82 | HR 101 | Temp 98.9°F | Resp 16 | Ht 68.0 in | Wt 269.0 lb

## 2015-05-15 DIAGNOSIS — E119 Type 2 diabetes mellitus without complications: Secondary | ICD-10-CM

## 2015-05-15 DIAGNOSIS — I10 Essential (primary) hypertension: Secondary | ICD-10-CM

## 2015-05-15 DIAGNOSIS — E785 Hyperlipidemia, unspecified: Secondary | ICD-10-CM

## 2015-05-15 DIAGNOSIS — F418 Other specified anxiety disorders: Secondary | ICD-10-CM

## 2015-05-15 DIAGNOSIS — E038 Other specified hypothyroidism: Secondary | ICD-10-CM

## 2015-05-15 DIAGNOSIS — Z6841 Body Mass Index (BMI) 40.0 and over, adult: Secondary | ICD-10-CM

## 2015-05-15 DIAGNOSIS — E1169 Type 2 diabetes mellitus with other specified complication: Secondary | ICD-10-CM | POA: Insufficient documentation

## 2015-05-15 LAB — LIPID PANEL
CHOL/HDL RATIO: 3.2 ratio (ref <=5.0)
CHOLESTEROL: 136 mg/dL (ref 125–200)
HDL: 42 mg/dL — AB (ref >=46)
LDL Cholesterol: 75 mg/dL (ref <130)
Triglycerides: 95 mg/dL (ref <150)
VLDL: 19 mg/dL (ref <30)

## 2015-05-15 LAB — POCT GLYCOSYLATED HEMOGLOBIN (HGB A1C): Hemoglobin A1C: 6.5

## 2015-05-15 MED ORDER — BUPROPION HCL ER (XL) 300 MG PO TB24: 300.0000 mg | ORAL_TABLET | Freq: Every day | ORAL | Status: DC

## 2015-05-15 MED ORDER — CLONAZEPAM 1 MG PO TABS: 1.0000 mg | ORAL_TABLET | Freq: Every day | ORAL | Status: DC

## 2015-05-15 MED ORDER — ALPRAZOLAM 0.5 MG PO TABS: 0.5000 mg | ORAL_TABLET | Freq: Two times a day (BID) | ORAL | Status: DC | PRN

## 2015-05-15 NOTE — Patient Instructions (Signed)
     IF you received an x-ray today, you will receive an invoice from [facility]. Please contact [facility] at [phone] with questions or concerns regarding your invoice.   IF you received labwork today, you will receive an invoice from [facility]. Please contact [facility] at [phone] with questions or concerns regarding your invoice.   Our billing staff will not be able to assist you with questions regarding bills from these companies.  You will be contacted with the lab results as soon as they are available. The fastest way to get your results is to activate your My Chart account. Instructions are located on the last page of this paperwork. If you have not heard from us regarding the results in 2 weeks, please contact this office.      
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
## 2015-05-19 ENCOUNTER — Encounter: Payer: Self-pay | Admitting: Internal Medicine

## 2015-05-22 ENCOUNTER — Ambulatory Visit: Payer: Self-pay | Admitting: Internal Medicine

## 2015-06-12 DIAGNOSIS — Z0271 Encounter for disability determination: Secondary | ICD-10-CM

## 2015-07-19 ENCOUNTER — Other Ambulatory Visit: Payer: Self-pay

## 2015-07-19 ENCOUNTER — Emergency Department (HOSPITAL_COMMUNITY)
Admission: EM | Admit: 2015-07-19 | Discharge: 2015-07-20 | Disposition: A | Discharge status: Home / Self Care | Payer: Self-pay | Attending: Emergency Medicine | Admitting: Emergency Medicine

## 2015-07-19 ENCOUNTER — Encounter (HOSPITAL_COMMUNITY): Payer: Self-pay | Admitting: Emergency Medicine

## 2015-07-19 DIAGNOSIS — Z7984 Long term (current) use of oral hypoglycemic drugs: Secondary | ICD-10-CM | POA: Insufficient documentation

## 2015-07-19 DIAGNOSIS — N390 Urinary tract infection, site not specified: Secondary | ICD-10-CM | POA: Insufficient documentation

## 2015-07-19 DIAGNOSIS — F17210 Nicotine dependence, cigarettes, uncomplicated: Secondary | ICD-10-CM | POA: Insufficient documentation

## 2015-07-19 DIAGNOSIS — Z79899 Other long term (current) drug therapy: Secondary | ICD-10-CM | POA: Insufficient documentation

## 2015-07-19 DIAGNOSIS — F329 Major depressive disorder, single episode, unspecified: Secondary | ICD-10-CM | POA: Insufficient documentation

## 2015-07-19 LAB — CBG MONITORING, ED: Glucose-Capillary: 157 mg/dL — ABNORMAL HIGH (ref 65–99)

## 2015-07-19 MED ORDER — LORAZEPAM 2 MG/ML IJ SOLN
0.5000 mg | Freq: Once | INTRAMUSCULAR | Status: AC
  Administered 2015-07-20: 0.5 mg via INTRAVENOUS
  Filled 2015-07-19: qty 1

## 2015-07-19 MED ORDER — ASPIRIN 81 MG PO CHEW
324.0000 mg | CHEWABLE_TABLET | Freq: Once | ORAL | Status: AC
  Administered 2015-07-19: 324 mg via ORAL
  Filled 2015-07-19: qty 4

## 2015-07-19 MED ORDER — ONDANSETRON HCL 4 MG/2ML IJ SOLN
4.0000 mg | Freq: Once | INTRAMUSCULAR | Status: AC
  Administered 2015-07-20: 4 mg via INTRAVENOUS
  Filled 2015-07-19: qty 2

## 2015-07-19 MED ORDER — SODIUM CHLORIDE 0.9 % IV BOLUS (SEPSIS)
1000.0000 mL | Freq: Once | INTRAVENOUS | Status: AC
  Administered 2015-07-20: 1000 mL via INTRAVENOUS

## 2015-07-19 NOTE — ED Notes (Signed)
Brought in by EMS from home with c/o dizziness.  Per EMS, pt reported that she started having dizziness and nausea 2 hours ago.  Pt reported that she has been driving "back and forth in the heat" throughout the day "without air conditioning on in the car".   She started getting dizzy and nauseous while preparing in the kitchen, rested for a while but symptoms persist.  Has hx of vertigo but pt stated, "It's not like it".  Pt denies any pain.

## 2015-07-19 NOTE — ED Provider Notes (Signed)
CSN: [id number]     Arrival date & time 07/19/15  2221 History   First MD Initiated Contact with Patient 07/19/15 2241     Chief Complaint  Patient presents with  . Dizziness     (Consider location/radiation/quality/duration/timing/severity/associated sxs/prior Treatment) HPI   Patient is a 65 year old female with history of thyroid disease, systolic heart murmur, diabetes, hypertension, hyperlipidemia, anxiety and depression, she presents to emergency department via EMS for sudden onset of lightheadedness that began while she was standing making dinner at 9 PM. She states that she felt dizzy but it did not feel like vertigo. Her presentation of dizziness and lightheadedness was associated with diaphoresis and nausea, it lasted several minutes, she was able to walk to the couch and sit down, and it resolved.  She continues to have intermittent episodes lasting for a few seconds to a few minutes at a time. She states it is not associated with exertion or positions it has occurred while in the ER sitting in the bed.  She denies syncope, blurry vision, neck pain, headache, neck pain, fever, chills, slurred speech, facial asymmetry, numbness, tingling, chest pain, abdominal pain, vomiting, shortness of breath, LE edema, palpitations.  Over the past 2-3 days she drove to [place] and back, arrived earlier today, she is concerned that she may be dehydrated because she did not have a AC while driving and she has not eaten or drinking very much, and she currently feels very thirsty.    Past Medical History  Diagnosis Date  . Allergy   . Depression   . Ulcer   . Thyroid disease   . Anxiety   . Heart murmur    Past Surgical History  Procedure Laterality Date  . Cholecystectomy    . Cesarean section    . Nasal septum surgery    . Breast surgery     History reviewed. No pertinent family history. Social History  Substance Use Topics  . Smoking status: Current Every Day Smoker -- 1.50  packs/day for 40 years    Types: Cigarettes    Last Attempt to Quit: 01/20/2012  . Smokeless tobacco: None     Comment: Pt doing E-cigarette as of 01/20/12  . Alcohol Use: No   OB History    No data available     Review of Systems  All other systems reviewed and are negative.     Allergies  Review of patient's allergies indicates no known allergies.  Home Medications   Prior to Admission medications   Medication Sig Start Date End Date Taking? Authorizing Provider  ALPRAZolam Prudy Feeler) 0.5 MG tablet Take 1-2 tablets (0.5-1 mg total) by mouth 2 (two) times daily as needed for anxiety. 05/15/15  Yes Tonye Pearson, MD  atorvastatin (LIPITOR) 20 MG tablet Take 1 tablet (20 mg total) by mouth daily. 01/16/15  Yes Tonye Pearson, MD  buPROPion (WELLBUTRIN XL) 300 MG 24 hr tablet Take 1 tablet (300 mg total) by mouth daily. 05/15/15  Yes Tonye Pearson, MD  clonazePAM (KLONOPIN) 1 MG tablet Take 1 tablet (1 mg total) by mouth at bedtime. For sleep or anxiety Patient taking differently: Take 1 mg by mouth at bedtime as needed for anxiety. For sleep or anxiety 05/15/15  Yes Tonye Pearson, MD  levothyroxine (SYNTHROID, LEVOTHROID) 125 MCG tablet Take 1 tablet (125 mcg total) by mouth daily before breakfast. 09/19/14  Yes Tonye Pearson, MD  losartan (COZAAR) 100 MG tablet Take 1 tablet (100 mg total) by mouth  daily. 01/18/15  Yes Tonye Pearsonobert P Doolittle, MD  metFORMIN (GLUCOPHAGE) 500 MG tablet Take 1 tablet (500 mg total) by mouth 2 (two) times daily with a meal. 01/16/15  Yes Tonye Pearsonobert P Doolittle, MD  cephALEXin (KEFLEX) 500 MG capsule Take 1 capsule (500 mg total) by mouth 4 (four) times daily. 07/20/15   Danelle BerryLeisa Mitra Duling, PA-C   BP 125/63 mmHg  Pulse 99  Temp(Src) 98.2 F (36.8 C) (Oral)  Resp 24  SpO2 95% Physical Exam  Constitutional: She is oriented to person, place, and time. She appears well-developed and well-nourished. No distress.  Obese female, NAD, generally well  appearing, but appears slightly anxious  HENT:  Head: Normocephalic and atraumatic.  Right Ear: Tympanic membrane and external ear normal. Tympanic membrane is not erythematous and not bulging. No middle ear effusion.  Left Ear: Tympanic membrane and external ear normal. Tympanic membrane is not erythematous and not bulging.  No middle ear effusion.  Nose: Nose normal.  Mouth/Throat: Uvula is midline and oropharynx is clear and moist. Mucous membranes are not pale, dry and not cyanotic. No oropharyngeal exudate, posterior oropharyngeal edema or posterior oropharyngeal erythema.  Mild nasal mucosal erythema  Eyes: Conjunctivae, EOM and lids are normal. Pupils are equal, round, and reactive to light. Right eye exhibits no discharge. Left eye exhibits no discharge. Right conjunctiva is not injected. Left conjunctiva is not injected. No scleral icterus. Right eye exhibits normal extraocular motion. Left eye exhibits normal extraocular motion.  Neck: Normal range of motion. No JVD present. No tracheal deviation present. No thyromegaly present.  Cardiovascular: Normal rate, regular rhythm, normal heart sounds and intact distal pulses.  Exam reveals no gallop and no friction rub.   No murmur heard. No lower extremity edema, symmetrical upper and lower extremity pulses, 2+, no LE erythema, no calf tenderness  Pulmonary/Chest: Effort normal and breath sounds normal. No respiratory distress. She has no wheezes. She has no rales. She exhibits no tenderness.  Abdominal: Soft. Bowel sounds are normal. She exhibits no distension and no mass. There is no tenderness. There is no rebound and no guarding.  Obese abdomen, soft, nontender, nondistended, normal bowel sounds 4  Musculoskeletal: Normal range of motion. She exhibits no edema or tenderness.  Lymphadenopathy:    She has no cervical adenopathy.  Neurological: She is alert and oriented to person, place, and time. She has normal strength and normal reflexes.  She displays no tremor. A sensory deficit (ulnar nerve distribution numbness on right - chronic, left lateral leg numbness - chronic, otherwise sensation to light touch intact) is present. No cranial nerve deficit. She exhibits normal muscle tone. She displays no seizure activity. Coordination normal. GCS eye subscore is 4. GCS verbal subscore is 5. GCS motor subscore is 6.  Speech is clear and goal oriented, follows commands Major Cranial nerves without deficit, no facial droop Normal strength in upper and lower extremities bilaterally including dorsiflexion and plantar flexion, strong and equal grip strength Moves extremities without ataxia, coordination intact Normal finger to nose and rapid alternating movements Gait testing deferred  Skin: Skin is warm. No rash noted. She is not diaphoretic. No erythema. No pallor.  Psychiatric: She has a normal mood and affect. Her behavior is normal. Judgment and thought content normal.  Nursing note and vitals reviewed.   ED Course  Procedures (including critical care time) Labs Review Labs Reviewed  BASIC METABOLIC PANEL - Abnormal; Notable for the following:    Glucose, Bld 149 (*)    All other  components within normal limits  CBC - Abnormal; Notable for the following:    WBC 10.8 (*)    All other components within normal limits  URINALYSIS, ROUTINE W REFLEX MICROSCOPIC (NOT AT [facility]) - Abnormal; Notable for the following:    Hgb urine dipstick SMALL (*)    Nitrite POSITIVE (*)    All other components within normal limits  URINE MICROSCOPIC-ADD ON - Abnormal; Notable for the following:    Squamous Epithelial / LPF 0-5 (*)    Bacteria, UA MANY (*)    All other components within normal limits  CBG MONITORING, ED - Abnormal; Notable for the following:    Glucose-Capillary 157 (*)    All other components within normal limits  D-DIMER, QUANTITATIVE (NOT AT [facility])  I-STAT TROPOININ, ED    Imaging Review No results found. I have personally  reviewed and evaluated these images and lab results as part of my medical decision-making.   EKG Interpretation   Date/Time:  Friday July 19 2015 23:32:28 EDT Ventricular Rate:  86 PR Interval:    QRS Duration: 90 QT Interval:  363 QTC Calculation: 435 R Axis:   53 Text Interpretation:  Sinus rhythm Confirmed by [facility]  MD, APRIL  ([id number]) on 07/20/2015 1:42:44 AM      MDM   Obese female with "dizziness" described more as near-syncope, denies vertigo, associated with nausea and diaphoresis, began at 9 pm while standing cooking dinner, lasted a few minutes, called EMS, in ER continues to have brief episodes while sitting in ER gurney, no other associated sx, she denies abdominal pain, chest pain, shortness of breath, palpitations, headache, visual disturbances, numbness, weakness, LE edema.  She is obese, history of diabetes, hypertension and hyperlipidemia, work up to include ACS workup, r/o PE, CBG's orthostatics normal, basic labs.  No neuro deficit on exam, however she was not ambulated due to continued sx.    Patient's lab work was significant for UTI with positive nitrites, many bacteria, hyematuria, patient's troponin was negative, d-dimer negative, electrolytes normal, mild leukocytosis of 10.8. EKG sinus rhythm, she is not orthostatic in the ER. She was able to ambulate without difficulty, no longer had symptoms when reevaluated, 2:15 AM,   Will D/C home with keflex.  Return precautions reviewed.  Pt discharged home in good condition, VSS, tolerating PO's and ambulating.  Pt will follow up with PCP.   Final diagnoses:  UTI (lower urinary tract infection)      Danelle BerryLeisa Meghanne Pletz, PA-C 07/20/15 0316  April Palumbo, MD 07/20/15 0425

## 2015-07-19 NOTE — ED Notes (Signed)
Bed: WA09 Expected date:  Expected time:  Means of arrival:  Comments: EMS 

## 2015-07-19 NOTE — ED Notes (Signed)
Stuck pt and was unsuccessful getting labs RN Freida Busmanllen made aware.

## 2015-07-20 LAB — BASIC METABOLIC PANEL
ANION GAP: 7 (ref 5–15)
BUN: 13 mg/dL (ref 6–20)
CALCIUM: 9.2 mg/dL (ref 8.9–10.3)
CO2: 24 mmol/L (ref 22–32)
CREATININE: 0.93 mg/dL (ref 0.44–1.00)
Chloride: 107 mmol/L (ref 101–111)
GLUCOSE: 149 mg/dL — AB (ref 65–99)
Potassium: 3.7 mmol/L (ref 3.5–5.1)
Sodium: 138 mmol/L (ref 135–145)

## 2015-07-20 LAB — URINALYSIS, ROUTINE W REFLEX MICROSCOPIC
Bilirubin Urine: NEGATIVE
Glucose, UA: NEGATIVE mg/dL
Ketones, ur: NEGATIVE mg/dL
Leukocytes, UA: NEGATIVE
NITRITE: POSITIVE — AB
Protein, ur: NEGATIVE mg/dL
SPECIFIC GRAVITY, URINE: 1.007 (ref 1.005–1.030)
pH: 6 (ref 5.0–8.0)

## 2015-07-20 LAB — CBC
HEMATOCRIT: 42.8 % (ref 36.0–46.0)
HEMOGLOBIN: 14.3 g/dL (ref 12.0–15.0)
MCH: 29.9 pg (ref 26.0–34.0)
MCHC: 33.4 g/dL (ref 30.0–36.0)
MCV: 89.5 fL (ref 78.0–100.0)
Platelets: 338 10*3/uL (ref 150–400)
RBC: 4.78 MIL/uL (ref 3.87–5.11)
RDW: 14.6 % (ref 11.5–15.5)
WBC: 10.8 10*3/uL — ABNORMAL HIGH (ref 4.0–10.5)

## 2015-07-20 LAB — URINE MICROSCOPIC-ADD ON

## 2015-07-20 LAB — D-DIMER, QUANTITATIVE (NOT AT ARMC)

## 2015-07-20 LAB — I-STAT TROPONIN, ED: Troponin i, poc: 0 ng/mL (ref 0.00–0.08)

## 2015-07-20 MED ORDER — CEPHALEXIN 250 MG PO CAPS
250.0000 mg | ORAL_CAPSULE | Freq: Once | ORAL | Status: AC
  Administered 2015-07-20: 250 mg via ORAL
  Filled 2015-07-20: qty 1

## 2015-07-20 MED ORDER — CEPHALEXIN 500 MG PO CAPS: 500.0000 mg | ORAL_CAPSULE | Freq: Four times a day (QID) | ORAL | Status: DC

## 2015-07-20 NOTE — ED Notes (Signed)
Pt was given ginger ale and sandwich earlier--- tolerated well.   Pt denies nausea at this time.

## 2015-07-20 NOTE — ED Notes (Signed)
Pt able to ambulate to the bathroom and in the hall with staff assistance.  Pt denied dizziness but stated, "My legs feel like rubber--- it's your fault; it's the medicine you gave me".

## 2015-07-20 NOTE — Discharge Instructions (Signed)
Urinary Tract Infection °Urinary tract infections (UTIs) can develop anywhere along your urinary tract. Your urinary tract is your body's drainage system for removing wastes and extra water. Your urinary tract includes two kidneys, two ureters, a bladder, and a urethra. Your kidneys are a pair of bean-shaped organs. Each kidney is about the size of your fist. They are located below your ribs, one on each side of your spine. °CAUSES °Infections are caused by microbes, which are microscopic organisms, including fungi, viruses, and bacteria. These organisms are so small that they can only be seen through a microscope. Bacteria are the microbes that most commonly cause UTIs. °SYMPTOMS  °Symptoms of UTIs may vary by age and gender of the patient and by the location of the infection. Symptoms in young women typically include a frequent and intense urge to urinate and a painful, burning feeling in the bladder or urethra during urination. Older women and men are more likely to be tired, shaky, and weak and have muscle aches and abdominal pain. A fever may mean the infection is in your kidneys. Other symptoms of a kidney infection include pain in your back or sides below the ribs, nausea, and vomiting. °DIAGNOSIS °To diagnose a UTI, your caregiver will ask you about your symptoms. Your caregiver will also ask you to provide a urine sample. The urine sample will be tested for bacteria and white blood cells. White blood cells are made by your body to help fight infection. °TREATMENT  °Typically, UTIs can be treated with medication. Because most UTIs are caused by a bacterial infection, they usually can be treated with the use of antibiotics. The choice of antibiotic and length of treatment depend on your symptoms and the type of bacteria causing your infection. °HOME CARE INSTRUCTIONS °· If you were prescribed antibiotics, take them exactly as your caregiver instructs you. Finish the medication even if you feel better after  you have only taken some of the medication. °· Drink enough water and fluids to keep your urine clear or pale yellow. °· Avoid caffeine, tea, and carbonated beverages. They tend to irritate your bladder. °· Empty your bladder often. Avoid holding urine for long periods of time. °· Empty your bladder before and after sexual intercourse. °· After a bowel movement, women should cleanse from front to back. Use each tissue only once. °SEEK MEDICAL CARE IF:  °· You have back pain. °· You develop a fever. °· Your symptoms do not begin to resolve within 3 days. °SEEK IMMEDIATE MEDICAL CARE IF:  °· You have severe back pain or lower abdominal pain. °· You develop chills. °· You have nausea or vomiting. °· You have continued burning or discomfort with urination. °MAKE SURE YOU:  °· Understand these instructions. °· Will watch your condition. °· Will get help right away if you are not doing well or get worse. °  °This information is not intended to replace advice given to you by your health care provider. Make sure you discuss any questions you have with your health care provider. °  °Document Released: 10/22/2004 Document Revised: 10/03/2014 Document Reviewed: 02/20/2011 °Elsevier Interactive Patient Education ©2016 [employer] ° °Near-Syncope °Near-syncope (commonly known as near fainting) is sudden weakness, dizziness, or feeling like you might pass out. During an episode of near-syncope, you may also develop pale skin, have tunnel vision, or feel sick to your stomach (nauseous). Near-syncope may occur when getting up after sitting or while standing for a long time. It is caused by a sudden   decrease in blood flow to the brain. This decrease can result from various causes or triggers, most of which are not serious. However, because near-syncope can sometimes be a sign of something serious, a medical evaluation is required. The specific cause is often not determined. °HOME CARE INSTRUCTIONS  °Monitor your condition for any  changes. The following actions may help to alleviate any discomfort you are experiencing: °· Have someone stay with you until you feel stable. °· Lie down right away and prop your feet up if you start feeling like you might faint. Breathe deeply and steadily. Wait until all the symptoms have passed. Most of these episodes last only a few minutes. You may feel tired for several hours.   °· Drink enough fluids to keep your urine clear or pale yellow.   °· If you are taking blood pressure or heart medicine, get up slowly when seated or lying down. Take several minutes to sit and then stand. This can reduce dizziness. °· Follow up with your health care provider as directed.  °SEEK IMMEDIATE MEDICAL CARE IF:  °· You have a severe headache.   °· You have unusual pain in the chest, abdomen, or back.   °· You are bleeding from the mouth or rectum, or you have black or tarry stool.   °· You have an irregular or very fast heartbeat.   °· You have repeated fainting or have seizure-like jerking during an episode.   °· You faint when sitting or lying down.   °· You have confusion.   °· You have difficulty walking.   °· You have severe weakness.   °· You have vision problems.   °MAKE SURE YOU:  °· Understand these instructions. °· Will watch your condition. °· Will get help right away if you are not doing well or get worse. °  °This information is not intended to replace advice given to you by your health care provider. Make sure you discuss any questions you have with your health care provider. °  °Document Released: 01/12/2005 Document Revised: 01/17/2013 Document Reviewed: 06/17/2012 °Elsevier Interactive Patient Education ©2016 Elsevier Inc. ° °

## 2015-11-26 ENCOUNTER — Other Ambulatory Visit: Payer: Self-pay

## 2015-11-26 NOTE — Telephone Encounter (Signed)
Pt needs a refill on clonazePAM (KLONOPIN), ALPRAZolam (XANAX) 0.5 MG tablet, losartan (COZAAR) 100 MG tablet [facility] to [facility]

## 2015-11-27 NOTE — Telephone Encounter (Signed)
OK to authorize this (I am out of the office until 11/06) in my name, but recommend that any additional refill requests before her visit in January go to Dr. Clelia CroftShaw.

## 2015-11-27 NOTE — Telephone Encounter (Signed)
Pt's last OV w/ Merla RichesDoolittle was 05/15/15 and was given Rx for 6 mos of RFs. Pended. She has appt sch w/[facility] on 01/30/16.

## 2015-11-28 MED ORDER — CLONAZEPAM 1 MG PO TABS: 1.0000 mg | ORAL_TABLET | Freq: Every day | ORAL | 0 refills | Status: DC

## 2015-11-28 NOTE — Telephone Encounter (Signed)
Called to pharmacy 

## 2015-12-05 ENCOUNTER — Telehealth: Payer: Self-pay

## 2015-12-05 NOTE — Telephone Encounter (Signed)
Previous Doolittle pt, [facility]. Needs refill on Xanax as well. States that she has 3 left and does not take them regularly, only as needed. Has upcoming apt with Dr Clelia CroftShaw in January to est care with her. Please advise. [phone]

## 2015-12-06 NOTE — Telephone Encounter (Signed)
She is welcome to come in for an appointment to discuss the best way to proceed with her care although I would recommend establishing with a psychiatrist if she needs to use both klonopin and xanax - since these are two different types of the same type of medication, I do not feel comfortable prescribing that -it should really only be done in rare situations under the care of a provider with more specialized expertise in psychiatric meds.  She can try some of the below providers or come in for a visit where we can discuss her history and med management plan.  [facility]  [address], [place], [place] [address]  [phone]  [facility] [address], [place], [place] [address]  [phone]  [facility] [address], [address], [place], [id number] Phone: [phone]  [facility]  [address], [place], [place] [address]  [phone]  Dr. Andee PolesParish McKinney  [facility]

## 2015-12-07 NOTE — Telephone Encounter (Signed)
If she is seeing psych, she should get the medications through that provider.

## 2015-12-07 NOTE — Telephone Encounter (Signed)
I have had lengthy conversation with patient she started both meds after her son was murdered. She states she uses Klonopin prn at bedtime and Alprazolam prn anxiety during the day. She states she is already established with a psychiatrist. She states she uses both of  the medications as needed.   I have advised her to come in sooner than her January appointment to discuss with Dr Clelia CroftShaw and have transferred her to schedule.

## 2015-12-11 ENCOUNTER — Ambulatory Visit (INDEPENDENT_AMBULATORY_CARE_PROVIDER_SITE_OTHER): Payer: Self-pay | Admitting: Family Medicine

## 2015-12-11 VITALS — BP 148/94 | HR 101 | Temp 99.1°F | Resp 17 | Ht 68.0 in | Wt 271.0 lb

## 2015-12-11 DIAGNOSIS — I10 Essential (primary) hypertension: Secondary | ICD-10-CM

## 2015-12-11 DIAGNOSIS — Z5181 Encounter for therapeutic drug level monitoring: Secondary | ICD-10-CM

## 2015-12-11 DIAGNOSIS — E119 Type 2 diabetes mellitus without complications: Secondary | ICD-10-CM

## 2015-12-11 DIAGNOSIS — G4700 Insomnia, unspecified: Secondary | ICD-10-CM

## 2015-12-11 DIAGNOSIS — F410 Panic disorder [episodic paroxysmal anxiety] without agoraphobia: Secondary | ICD-10-CM | POA: Insufficient documentation

## 2015-12-11 DIAGNOSIS — E785 Hyperlipidemia, unspecified: Secondary | ICD-10-CM | POA: Insufficient documentation

## 2015-12-11 DIAGNOSIS — J329 Chronic sinusitis, unspecified: Secondary | ICD-10-CM

## 2015-12-11 DIAGNOSIS — Z720 Tobacco use: Secondary | ICD-10-CM

## 2015-12-11 DIAGNOSIS — F17200 Nicotine dependence, unspecified, uncomplicated: Secondary | ICD-10-CM | POA: Insufficient documentation

## 2015-12-11 DIAGNOSIS — F418 Other specified anxiety disorders: Secondary | ICD-10-CM

## 2015-12-11 DIAGNOSIS — E038 Other specified hypothyroidism: Secondary | ICD-10-CM

## 2015-12-11 DIAGNOSIS — F430 Acute stress reaction: Secondary | ICD-10-CM

## 2015-12-11 MED ORDER — LOSARTAN POTASSIUM 100 MG PO TABS: 100.0000 mg | ORAL_TABLET | Freq: Every day | ORAL | 0 refills | Status: DC

## 2015-12-11 MED ORDER — CETIRIZINE HCL 10 MG PO TABS: 10.0000 mg | ORAL_TABLET | Freq: Every day | ORAL | 11 refills | Status: DC

## 2015-12-11 MED ORDER — GUAIFENESIN ER 1200 MG PO TB12: 1.0000 | ORAL_TABLET | Freq: Two times a day (BID) | ORAL | 1 refills | Status: DC | PRN

## 2015-12-11 MED ORDER — ATORVASTATIN CALCIUM 20 MG PO TABS: 20.0000 mg | ORAL_TABLET | Freq: Every day | ORAL | 0 refills | Status: DC

## 2015-12-11 MED ORDER — AMOXICILLIN 875 MG PO TABS: 875.0000 mg | ORAL_TABLET | Freq: Two times a day (BID) | ORAL | 0 refills | Status: DC

## 2015-12-11 MED ORDER — ALPRAZOLAM 0.5 MG PO TABS: 0.5000 mg | ORAL_TABLET | Freq: Two times a day (BID) | ORAL | 1 refills | Status: DC | PRN

## 2015-12-11 MED ORDER — LEVOTHYROXINE SODIUM 125 MCG PO TABS: 125.0000 ug | ORAL_TABLET | Freq: Every day | ORAL | 0 refills | Status: DC

## 2015-12-11 MED ORDER — CLONAZEPAM 1 MG PO TABS: 1.0000 mg | ORAL_TABLET | Freq: Every day | ORAL | 0 refills | Status: DC

## 2015-12-11 MED ORDER — METFORMIN HCL 500 MG PO TABS: 500.0000 mg | ORAL_TABLET | Freq: Two times a day (BID) | ORAL | 0 refills | Status: DC

## 2015-12-11 NOTE — Patient Instructions (Addendum)
Diabetes Mellitus and Food It is important for you to manage your blood sugar (glucose) level. Your blood glucose level can be greatly affected by what you eat. Eating healthier foods in the appropriate amounts throughout the day at about the same time each day will help you control your blood glucose level. It can also help slow or prevent worsening of your diabetes mellitus. Healthy eating may even help you improve the level of your blood pressure and reach or maintain a healthy weight. General recommendations for healthful eating and cooking habits include:  Eating meals and snacks regularly. Avoid going long periods of time without eating to lose weight.  Eating a diet that consists mainly of plant-based foods, such as fruits, vegetables, nuts, legumes, and whole grains.  Using low-heat cooking methods, such as baking, instead of high-heat cooking methods, such as deep frying. Work with your dietitian to make sure you understand how to use the Nutrition Facts information on food labels. How can food affect me? Carbohydrates  Carbohydrates affect your blood glucose level more than any other type of food. Your dietitian will help you determine how many carbohydrates to eat at each meal and teach you how to count carbohydrates. Counting carbohydrates is important to keep your blood glucose at a healthy level, especially if you are using insulin or taking certain medicines for diabetes mellitus. Alcohol  Alcohol can cause sudden decreases in blood glucose (hypoglycemia), especially if you use insulin or take certain medicines for diabetes mellitus. Hypoglycemia can be a life-threatening condition. Symptoms of hypoglycemia (sleepiness, dizziness, and disorientation) are similar to symptoms of having too much alcohol. If your health care provider has given you approval to drink alcohol, do so in moderation and use the following guidelines:  Women should not have more than one drink per day, and  men should not have more than two drinks per day. One drink is equal to:  12 oz of beer.  5 oz of wine.  1 oz of hard liquor.  Do not drink on an empty stomach.  Keep yourself hydrated. Have water, diet soda, or unsweetened iced tea.  Regular soda, juice, and other mixers might contain a lot of carbohydrates and should be counted. What foods are not recommended? As you make food choices, it is important to remember that all foods are not the same. Some foods have fewer nutrients per serving than other foods, even though they might have the same number of calories or carbohydrates. It is difficult to get your body what it needs when you eat foods with fewer nutrients. Examples of foods that you should avoid that are high in calories and carbohydrates but low in nutrients include:  Trans fats (most processed foods list trans fats on the Nutrition Facts label).  Regular soda.  Juice.  Candy.  Sweets, such as cake, pie, doughnuts, and cookies.  Fried foods. What foods can I eat? Eat nutrient-rich foods, which will nourish your body and keep you healthy. The food you should eat also will depend on several factors, including:  The calories you need.  The medicines you take.  Your weight.  Your blood glucose level.  Your blood pressure level.  Your cholesterol level. You should eat a variety of foods, including:  Protein.  Lean cuts of meat.  Proteins low in saturated fats, such as fish, egg whites, and beans. Avoid processed meats.  Fruits and vegetables.  Fruits and vegetables that may help control blood glucose levels, such as apples, mangoes,  and yams.  Dairy products.  Choose fat-free or low-fat dairy products, such as milk, yogurt, and cheese.  Grains, bread, pasta, and rice.  Choose whole grain products, such as multigrain bread, whole oats, and brown rice. These foods may help control blood pressure.  Fats.  Foods containing healthful fats, such as  nuts, avocado, olive oil, canola oil, and fish. Does everyone with diabetes mellitus have the same meal plan? Because every person with diabetes mellitus is different, there is not one meal plan that works for everyone. It is very important that you meet with a dietitian who will help you create a meal plan that is just right for you. This information is not intended to replace advice given to you by your health care provider. Make sure you discuss any questions you have with your health care provider. Document Released: 10/09/2004 Document Revised: 06/20/2015 Document Reviewed: 12/09/2012 Elsevier Interactive Patient Education  2017 [employer]   IF you received an x-ray today, you will receive an invoice from [facility]. Please contact [facility] at [phone] with questions or concerns regarding your invoice.   IF you received labwork today, you will receive an invoice from [facility]. Please contact [facility] at [phone] with questions or concerns regarding your invoice.   Our billing staff will not be able to assist you with questions regarding bills from these companies.  You will be contacted with the lab results as soon as they are available. The fastest way to get your results is to activate your My Chart account. Instructions are located on the last page of this paperwork. If you have not heard from us regarding the results in 2 weeks, please contact this office.    We recommend that you schedule a mammogram for breast cancer screening. Typically, you do not need a referral to do this. Please contact a local imaging center to schedule your mammogram.  [facility] - [phone]) [phone]  *ask for the Radiology Department [facility] - [phone]) [phone] or [phone]  [facility] - [phone]) [phone] [facility] - [phone](336) [phone] [facility] - [phone]  *ask for the  Radiology Department [facility] - [phone]  *ask for the Radiology Department [facility] - [phone]  *ask for the Mammography Department [facility] - [phone]) [phone]   Sinusitis, Adult Sinusitis is soreness and inflammation of your sinuses. Sinuses are hollow spaces in the bones around your face. Your sinuses are located:  Around your eyes.  In the middle of your forehead.  Behind your nose.  In your cheekbones. Your sinuses and nasal passages are lined with a stringy fluid (mucus). Mucus normally drains out of your sinuses. When your nasal tissues become inflamed or swollen, the mucus can become trapped or blocked so air cannot flow through your sinuses. This allows bacteria, viruses, and funguses to grow, which leads to infection. Sinusitis can develop quickly and last for 7?10 days (acute) or for more than 12 weeks (chronic). Sinusitis often develops after a cold. What are the causes? This condition is caused by anything that creates swelling in the sinuses or stops mucus from draining, including:  Allergies.  Asthma.  Bacterial or viral infection.  Abnormally shaped bones between the nasal passages.  Nasal growths that contain mucus (nasal polyps).  Narrow sinus openings.  Pollutants, such as chemicals or irritants in the air.  A foreign object stuck in the nose.  A fungal infection.  This is rare. What increases the risk? The following factors may make you more likely to develop this condition:  Having allergies or asthma.  Having had a recent cold or respiratory tract infection.  Having structural deformities or blockages in your nose or sinuses.  Having a weak immune system.  Doing a lot of swimming or diving.  Overusing nasal sprays.  Smoking. What are the signs or symptoms? The main symptoms of this condition are pain and a feeling of pressure around the affected sinuses. Other symptoms include:  Upper  toothache.  Earache.  Headache.  Bad breath.  Decreased sense of smell and taste.  A cough that may get worse at night.  Fatigue.  Fever.  Thick drainage from your nose. The drainage is often green and it may contain pus (purulent).  Stuffy nose or congestion.  Postnasal drip. This is when extra mucus collects in the throat or back of the nose.  Swelling and warmth over the affected sinuses.  Sore throat.  Sensitivity to light. How is this diagnosed? This condition is diagnosed based on symptoms, a medical history, and a physical exam. To find out if your condition is acute or chronic, your health care provider may:  Look in your nose for signs of nasal polyps.  Tap over the affected sinus to check for signs of infection.  View the inside of your sinuses using an imaging device that has a light attached (endoscope). If your health care provider suspects that you have chronic sinusitis, you may also:  Be tested for allergies.  Have a sample of mucus taken from your nose (nasal culture) and checked for bacteria.  Have a mucus sample examined to see if your sinusitis is related to an allergy. If your sinusitis does not respond to treatment and it lasts longer than 8 weeks, you may have an MRI or CT scan to check your sinuses. These scans also help to determine how severe your infection is. In rare cases, a bone biopsy may be done to rule out more serious types of fungal sinus disease. How is this treated? Treatment for sinusitis depends on the cause and whether your condition is chronic or acute. If a virus is causing your sinusitis, your symptoms will go away on their own within 10 days. You may be given medicines to relieve your symptoms, including:  Topical nasal decongestants. They shrink swollen nasal passages and let mucus drain from your sinuses.  Antihistamines. These drugs block inflammation that is triggered by allergies. This can help to ease swelling in your  nose and sinuses.  Topical nasal corticosteroids. These are nasal sprays that ease inflammation and swelling in your nose and sinuses.  Nasal saline washes. These rinses can help to get rid of thick mucus in your nose. If your condition is caused by bacteria, you will be given an antibiotic medicine. If your condition is caused by a fungus, you will be given an antifungal medicine. Surgery may be needed to correct underlying conditions, such as narrow nasal passages. Surgery may also be needed to remove polyps. Follow these instructions at home: Medicines  Take, use, or apply over-the-counter and prescription medicines only as told by your health care provider. These may include nasal sprays.  If you were prescribed an antibiotic medicine, take it as told by your health care provider. Do not stop taking the antibiotic even if you start to feel better. Hydrate and Humidify  Drink enough water to keep your urine clear or pale yellow.  Staying hydrated will help to thin your mucus.  Use a cool mist humidifier to keep the humidity level in your home above 50%.  Inhale steam for 10-15 minutes, 3-4 times a day or as told by your health care provider. You can do this in the bathroom while a hot shower is running.  Limit your exposure to cool or dry air. Rest  Rest as much as possible.  Sleep with your head raised (elevated).  Make sure to get enough sleep each night. General instructions  Apply a warm, moist washcloth to your face 3-4 times a day or as told by your health care provider. This will help with discomfort.  Wash your hands often with soap and water to reduce your exposure to viruses and other germs. If soap and water are not available, use hand sanitizer.  Do not smoke. Avoid being around people who are smoking (secondhand smoke).  Keep all follow-up visits as told by your health care provider. This is important. Contact a health care provider if:  You have a  fever.  Your symptoms get worse.  Your symptoms do not improve within 10 days. Get help right away if:  You have a severe headache.  You have persistent vomiting.  You have pain or swelling around your face or eyes.  You have vision problems.  You develop confusion.  Your neck is stiff.  You have trouble breathing. This information is not intended to replace advice given to you by your health care provider. Make sure you discuss any questions you have with your health care provider. Document Released: 01/12/2005 Document Revised: 09/08/2015 Document Reviewed: 11/07/2014 Elsevier Interactive Patient Education  2017 [employer]

## 2015-12-11 NOTE — Progress Notes (Signed)
Subjective:  By signing my name below, I, [name], attest that this documentation has been prepared under the direction and in the presence of [name], MD.  Electronically Signed: Andrew Au, ED Scribe. 12/11/2015. 1:55 PM.   Patient ID: Jasmine Long, female    DOB: 09-10-1950, 65 y.o.   MRN: [id number]  Medication Refill  Pertinent negatives include no chills, coughing or fever.   Chief Complaint  Patient presents with  . Medication Refill    xanax, klonopin    HPI Comments: Jasmine Long is a 65 y.o. female who presents to the [facility] for a medication refill of xanax and clonazepam. Pt states her son was murder years prior after which she was started on bzd. Uses klonopin qhs and alprazolam prn, for day time anxiety and follows psychiatry. Has type II diabetes diagnosed 06/2014, started on formin 200 bid 1 year prior. Also hx of hypothyroid, HTN and ongoing tobacco use. She has had a very tragic life detailed in Dr. Netta Corrigan note, 04/2015. She also has chronic occipital headaches followed by Dr. Vela Prose  With botox injections. Has seen Dr. Jillyn Hidden and Dr. Ethelene Hal for left leg neuropathy. Labs last checked 7 months prior, shows lipid panel at goal, a1c of 6.5, TSH last check 08/2014.  Pt does not check BP or sugars outside of the office. 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 Her son passed away Mar 28, 2009 after being shot, and states his birthday is in 3 days, resulting in her high BP. She has been on Welbrutrin since 2011; does not feel Wellbutrin is causing anxiety.  Pt takes klonopin as needed. She uses 30 pills over the course of 6 months. She has not seen psychiatry in several years. Her son lives with her for the time being. She denies chronic cough and SOB.    Pt reports postnasal drip due to seasonal allergies for about 1.5 weeks. She has not tried OTC medication. She denies fever and chills.  In the past she had received Keflex and  Claritin to treat allergies. Also has had solumedrol injection.   Pt smokes a pack a day but used to smoke 3 packs a day.   Patient Active Problem List   Diagnosis Date Noted  . Diabetes (HCC) 05/15/2015  . Hypothyroid 04/29/2011  . BMI 40.0-44.9, adult (HCC) 04/29/2011  . Depression with anxiety 04/29/2011  . GERD (gastroesophageal reflux disease) 04/29/2011  . 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  . Nicotine addiction 04/29/2011  . Hypertension 04/29/2011  . Murmur, cardiac 04/29/2011   Past Medical History:  Diagnosis Date  . Allergy   . Anxiety   . Depression   . Heart murmur   . Thyroid disease   . Ulcer [facility])    Past Surgical History:  Procedure Laterality Date  . BREAST SURGERY    . CESAREAN SECTION    . CHOLECYSTECTOMY    . NASAL SEPTUM SURGERY     No Known Allergies Prior to Admission medications   Medication Sig Start Date End Date Taking? Authorizing Provider  ALPRAZolam Prudy Feeler) 0.5 MG tablet Take 1-2 tablets (0.5-1 mg total) by mouth 2 (two) times daily as needed for anxiety. 05/15/15  Yes Tonye Pearson, MD  buPROPion (WELLBUTRIN XL) 300 MG 24 hr tablet Take 1 tablet (300 mg total) by mouth daily. 05/15/15  Yes Tonye Pearson, MD  clonazePAM (KLONOPIN) 1 MG tablet Take 1 tablet (1 mg total)  by mouth at bedtime. For sleep or anxiety 11/28/15  Yes Chelle Jeffery, PA-C  levothyroxine (SYNTHROID, LEVOTHROID) 125 MCG tablet Take 1 tablet (125 mcg total) by mouth daily before breakfast. 09/19/14  Yes Tonye Pearson, MD  metFORMIN (GLUCOPHAGE) 500 MG tablet Take 1 tablet (500 mg total) by mouth 2 (two) times daily with a meal. 01/16/15  Yes Tonye Pearson, MD  atorvastatin (LIPITOR) 20 MG tablet Take 1 tablet (20 mg total) by mouth daily. 01/16/15   Tonye Pearson, MD  losartan (COZAAR) 100 MG tablet Take 1 tablet (100 mg total) by mouth daily. 01/18/15   Tonye Pearson, MD   Social History   Social History  . Marital status: Divorced    Spouse  name: N/A  . Number of children: N/A  . Years of education: N/A   Occupational History  . Not on file.   Social History Main Topics  . Smoking status: Current Every Day Smoker    Packs/day: 1.50    Years: 40.00    Types: Cigarettes    Last attempt to quit: 01/20/2012  . Smokeless tobacco: Not on file     Comment: Pt doing E-cigarette as of 01/20/12  . Alcohol use No  . Drug use: No  . Sexual activity: Not on file   Other Topics Concern  . Not on file   Social History Narrative  . No narrative on file     Review of Systems  Constitutional: Negative for chills and fever.  HENT: Positive for postnasal drip.   Respiratory: Negative for cough and shortness of breath.   Gastrointestinal: Negative for diarrhea.  Allergic/Immunologic: Positive for environmental allergies.  Psychiatric/Behavioral: The patient is nervous/anxious.        Objective:   Physical Exam  Constitutional: She is oriented to person, place, and time. She appears well-developed and well-nourished. No distress.  Smelled strongly of cigarette smoke.   HENT:  Right Ear: Tympanic membrane is injected. A middle ear effusion is present.  Left Ear: Tympanic membrane is erythematous. A middle ear effusion is present.  Mouth/Throat: Posterior oropharyngeal erythema present.  Very poor dentition.  Nares with erythema  Cardiovascular: Regular rhythm.  Tachycardia present.   Murmur heard.  Systolic murmur is present with a grade of 2/6  Pulmonary/Chest: Effort normal. She has rhonchi ( bibasilar, cleared with deep breathing).  Lymphadenopathy:       Head (right side): Submandibular adenopathy present.       Head (left side): Submandibular ( left greater than right) adenopathy present.  Neurological: She is alert and oriented to person, place, and time.  Skin: Skin is warm and dry.  Psychiatric: She has a normal mood and affect. Her behavior is normal.    Vitals:   12/11/15 1349  BP: (!) 150/84  Pulse: (!)  101  Resp: 17  Temp: 99.1 F (37.3 C)  TempSrc: Oral  SpO2: 96%  Weight: 271 lb (122.9 kg)  Height: 5\' 8"  (1.727 m)     Assessment & Plan:   1. Type 2 diabetes mellitus without complication, without long-term current use of insulin (HCC)   2. Essential hypertension   3. Other specified hypothyroidism   4. Hyperlipidemia, unspecified hyperlipidemia type   5. Depression with anxiety   6. Insomnia, unspecified type   7. Panic attack as reaction to stress   8. Tobacco abuse   9. Medication monitoring encounter   10. Chronic sinusitis, unspecified location    Database reviewed. Pt has filled #  60 alprazolam 0.5 mg in June and July and #30 clonazepam 1 mg in July and November.   Meds ordered this encounter  Medications  . ALPRAZolam (XANAX) 0.5 MG tablet    Sig: Take 1-2 tablets (0.5-1 mg total) by mouth 2 (two) times daily as needed for anxiety.    Dispense:  60 tablet    Refill:  1  . atorvastatin (LIPITOR) 20 MG tablet    Sig: Take 1 tablet (20 mg total) by mouth daily.    Dispense:  90 tablet    Refill:  0  . clonazePAM (KLONOPIN) 1 MG tablet    Sig: Take 1 tablet (1 mg total) by mouth at bedtime. For sleep or anxiety    Dispense:  30 tablet    Refill:  0  . metFORMIN (GLUCOPHAGE) 500 MG tablet    Sig: Take 1 tablet (500 mg total) by mouth 2 (two) times daily with a meal.    Dispense:  180 tablet    Refill:  0  . losartan (COZAAR) 100 MG tablet    Sig: Take 1 tablet (100 mg total) by mouth daily.    Dispense:  90 tablet    Refill:  0  . levothyroxine (SYNTHROID, LEVOTHROID) 125 MCG tablet    Sig: Take 1 tablet (125 mcg total) by mouth daily before breakfast.    Dispense:  90 tablet    Refill:  0  . cetirizine (ZYRTEC) 10 MG tablet    Sig: Take 1 tablet (10 mg total) by mouth at bedtime.    Dispense:  30 tablet    Refill:  11  . Guaifenesin (MUCINEX MAXIMUM STRENGTH) 1200 MG TB12    Sig: Take 1 tablet (1,200 mg total) by mouth every 12 (twelve) hours as needed.     Dispense:  14 tablet    Refill:  1  . amoxicillin (AMOXIL) 875 MG tablet    Sig: Take 1 tablet (875 mg total) by mouth 2 (two) times daily.    Dispense:  20 tablet    Refill:  0   Over 40 min spent in face-to-face evaluation of and consultation with patient and coordination of care.  Over 50% of this time was spent counseling this patient.  I personally performed the services described in this documentation, which was scribed in my presence. The recorded information has been reviewed and considered, and addended by me as needed.   [name], M.D.  [facility] [address] [place], [place] [address] [phone] phone [phone] fax  12/26/15 10:54 PM

## 2016-01-30 ENCOUNTER — Encounter: Payer: Self-pay | Admitting: Family Medicine

## 2016-01-30 ENCOUNTER — Ambulatory Visit (INDEPENDENT_AMBULATORY_CARE_PROVIDER_SITE_OTHER): Payer: Self-pay | Admitting: Family Medicine

## 2016-01-30 VITALS — BP 160/70 | HR 90 | Temp 98.6°F | Ht 68.0 in | Wt 274.0 lb

## 2016-01-30 DIAGNOSIS — E119 Type 2 diabetes mellitus without complications: Secondary | ICD-10-CM

## 2016-01-30 DIAGNOSIS — I10 Essential (primary) hypertension: Secondary | ICD-10-CM

## 2016-01-30 DIAGNOSIS — E785 Hyperlipidemia, unspecified: Secondary | ICD-10-CM

## 2016-01-30 DIAGNOSIS — E038 Other specified hypothyroidism: Secondary | ICD-10-CM

## 2016-01-30 NOTE — Progress Notes (Signed)
Subjective:    Patient ID: Jasmine Long, female    DOB: 1950/06/18, 66 y.o.   MRN: [id number] Chief Complaint  Patient presents with  . Follow-up    medication an d blood work    HPI  DM:  TKZ:SWFUX pressure was quite elevated at last visit which patient attributed due to the stress and grief at the anniversary of her son's murder.  HPL: Lipid panel checked 8 months prior with LDL of 75 and non-HDL of 94 on lipitor 20.  Anxiety: She uses alprazolam twice during the day as needed and his only filled alprazolam rx from me in the past 2 mos for #60 - has not needed the refill. She has not filled the prior Klonopin prescription at all which uses 1 mg daily at bedtime when necessary sleep very rarely. In the past 6 months Mrs. Noeth has only used to alprazolam prescription and to clonazepam scripts.  Notes the klonopin knocks her out but the alprazolam just takes down her anxiety just a little. He is living with his mother.   He crushed his ankle in [place] in May and became non-weightbearing until May and in August he started being partial weightbearing., He is still a having  A lot of restricitons and is supposed ot be elevated.  He has not been released by his orthopedist.  He was vacationing in [place] when he crushed it - he weas in the hosp x 2 wks with [facility] - and so she is traveling back there for his visits still. He is now staying with her. He dropped out of law school after his younger brother died. He is off of pain medicaitons for his foot.  Does not check her glood sugars at home due to  Going to try njections in her lower back with Dr. Georgiann Mohs though her workers comp.  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 Infection resolved buts till a long of congestion  Past Medical History:  Diagnosis Date  . Allergy   . Anxiety   . Depression   . Heart murmur   . Thyroid disease   . Ulcer [facility])    Past Surgical History:  Procedure  Laterality Date  . BREAST SURGERY    . CESAREAN SECTION    . CHOLECYSTECTOMY    . NASAL SEPTUM SURGERY     Current Outpatient Prescriptions on File Prior to Visit  Medication Sig Dispense Refill  . ALPRAZolam (XANAX) 0.5 MG tablet Take 1-2 tablets (0.5-1 mg total) by mouth 2 (two) times daily as needed for anxiety. 60 tablet 1  . atorvastatin (LIPITOR) 20 MG tablet Take 1 tablet (20 mg total) by mouth daily. 90 tablet 0  . buPROPion (WELLBUTRIN XL) 300 MG 24 hr tablet Take 1 tablet (300 mg total) by mouth daily. 90 tablet 3  . cetirizine (ZYRTEC) 10 MG tablet Take 1 tablet (10 mg total) by mouth at bedtime. 30 tablet 11  . clonazePAM (KLONOPIN) 1 MG tablet Take 1 tablet (1 mg total) by mouth at bedtime. For sleep or anxiety 30 tablet 0  . levothyroxine (SYNTHROID, LEVOTHROID) 125 MCG tablet Take 1 tablet (125 mcg total) by mouth daily before breakfast. 90 tablet 0  . losartan (COZAAR) 100 MG tablet Take 1 tablet (100 mg total) by mouth daily. 90 tablet 0  . metFORMIN (GLUCOPHAGE) 500 MG tablet Take 1 tablet (500 mg total) by mouth 2 (two) times daily with a meal. 180 tablet 0  No current facility-administered medications on file prior to visit.    No Known Allergies History reviewed. No pertinent family history. Social History   Social History  . Marital status: Divorced    Spouse name: N/A  . Number of children: N/A  . Years of education: N/A   Social History Main Topics  . Smoking status: Current Every Day Smoker    Packs/day: 1.50    Years: 40.00    Types: Cigarettes    Last attempt to quit: 01/20/2012  . Smokeless tobacco: Never Used     Comment: Pt doing E-cigarette as of 01/20/12  . Alcohol use No  . Drug use: No  . Sexual activity: Not Asked   Other Topics Concern  . None   Social History Narrative  . None   Depression screen [facility] 2/9 01/30/2016 12/11/2015 05/15/2015 01/16/2015  Decreased Interest 1 0 0 0  Down, Depressed, Hopeless 2 0 0 0  PHQ - 2 Score 3 0 0 0    Altered sleeping 3 - - -  Tired, decreased energy 0 - - -  Change in appetite 2 - - -  Feeling bad or failure about yourself  0 - - -  Trouble concentrating 0 - - -  Moving slowly or fidgety/restless 0 - - -  Suicidal thoughts 0 - - -  PHQ-9 Score 8 - - -  Difficult doing work/chores Not difficult at all - - -    Review of Systems See hpi    Objective:   Physical Exam  Constitutional: She is oriented to person, place, and time. She appears well-developed and well-nourished. No distress.  HENT:  Head: Normocephalic and atraumatic.  Right Ear: External ear normal.  Left Ear: External ear normal.  Eyes: Conjunctivae are normal. No scleral icterus.  Neck: Normal range of motion. Neck supple. No thyromegaly present.  Cardiovascular: Normal rate, regular rhythm, normal heart sounds and intact distal pulses.   Pulmonary/Chest: Effort normal and breath sounds normal. No respiratory distress.  Musculoskeletal: She exhibits no edema.  Lymphadenopathy:    She has no cervical adenopathy.  Neurological: She is alert and oriented to person, place, and time.  Skin: Skin is warm and dry. She is not diaphoretic. No erythema.  Psychiatric: She has a normal mood and affect. Her behavior is normal.     Diabetic Foot Exam - Simple   Simple Foot Form Visual Inspection No deformities, no ulcerations, no other skin breakdown bilaterally:  Yes Sensation Testing Intact to touch and monofilament testing bilaterally:  Yes Pulse Check Posterior Tibialis and Dorsalis pulse intact bilaterally:  Yes Comments Callus on left and right foot     BP (!) 165/75 (BP Location: Right Arm, Patient Position: Sitting, Cuff Size: Large)   Pulse 90   Temp 98.6 F (37 C) (Oral)   Ht 5\' 8"  (1.727 m)   Wt 274 lb (124.3 kg)   SpO2 97%   BMI 41.66 kg/m      Assessment & Plan:  Blood pressure continues to be elevated No health insurance a1c tsh. Cmp,  Have them hold on file.  1. Type 2 diabetes  mellitus without complication, without long-term current use of insulin (HCC) - increase metformin from 500 bid to 1000 bid as a1c 7.0  2. Other specified hypothyroidism   3. Essential hypertension   4. Hyperlipidemia, unspecified hyperlipidemia type     Orders Placed This Encounter  Procedures  . Comprehensive metabolic panel  . TSH  . Microalbumin /  creatinine urine ratio  . Hemoglobin A1C  . Lipid panel    Order Specific Question:   Has the patient fasted?    Answer:   Yes  . HM DIABETES FOOT EXAM    [name], M.D.  [facility] [address] [place], [place] [address] [phone] phone [phone] fax  02/28/16 2:56 AM  Results for orders placed or performed in visit on 01/30/16  Comprehensive metabolic panel  Result Value Ref Range   Glucose 138 (H) 65 - 99 mg/dL   BUN 13 8 - 27 mg/dL   Creatinine, Ser 6.01 0.57 - 1.00 mg/dL   GFR calc non Af Amer 65 >59 mL/min/1.73   GFR calc Af Amer 75 >59 mL/min/1.73   BUN/Creatinine Ratio 14 12 - 28   Sodium 140 134 - 144 mmol/L   Potassium 4.5 3.5 - 5.2 mmol/L   Chloride 98 96 - 106 mmol/L   CO2 20 18 - 29 mmol/L   Calcium 9.9 8.7 - 10.3 mg/dL   Total Protein 7.4 6.0 - 8.5 g/dL   Albumin 4.6 3.6 - 4.8 g/dL   Globulin, Total 2.8 1.5 - 4.5 g/dL   Albumin/Globulin Ratio 1.6 1.2 - 2.2   Bilirubin Total 0.4 0.0 - 1.2 mg/dL   Alkaline Phosphatase 103 39 - 117 IU/L   AST 14 0 - 40 IU/L   ALT 13 0 - 32 IU/L  TSH  Result Value Ref Range   TSH 1.420 0.450 - 4.500 uIU/mL  Microalbumin / creatinine urine ratio  Result Value Ref Range   Creatinine, Urine 50.2 Not Estab. mg/dL   Albumin, Urine 7.5 Not Estab. ug/mL   Microalb/Creat Ratio 14.9 0.0 - 30.0 mg/g creat  Hemoglobin A1C  Result Value Ref Range   Hgb A1c MFr Bld 7.0 (H) 4.8 - 5.6 %   Est. average glucose Bld gHb Est-mCnc 154 mg/dL  Lipid panel  Result Value Ref Range   Cholesterol, Total 155 100 - 199 mg/dL   Triglycerides 83 0 -  149 mg/dL   HDL 48 >09 mg/dL   VLDL Cholesterol Cal 17 5 - 40 mg/dL   LDL Calculated 90 0 - 99 mg/dL   Chol/HDL Ratio 3.2 0.0 - 4.4 ratio units

## 2016-01-30 NOTE — Patient Instructions (Addendum)
   IF you received an x-ray today, you will receive an invoice from [facility]. Please contact [facility] at [phone] with questions or concerns regarding your invoice.   IF you received labwork today, you will receive an invoice from [facility]. Please contact [facility] at [phone] with questions or concerns regarding your invoice.   Our billing staff will not be able to assist you with questions regarding bills from these companies.  You will be contacted with the lab results as soon as they are available. The fastest way to get your results is to activate your My Chart account. Instructions are located on the last page of this paperwork. If you have not heard from us regarding the results in 2 weeks, please contact this office.      Managing Your Hypertension Hypertension is commonly called high blood pressure. Blood pressure is a measurement of how strongly your blood is pressing against the walls of your arteries. Arteries are blood vessels that carry blood from your heart throughout your body. Blood pressure does not stay the same. It rises when you are active, excited, or nervous. It lowers when you are sleeping or relaxed. If the numbers that measure your blood pressure stay above normal most of the time, you are at risk for health problems. Hypertension is a long-term (chronic) condition in which blood pressure is elevated. This condition often has no signs or symptoms. The cause of the condition is usually not known. What are blood pressure readings? A blood pressure reading is recorded as two numbers, such as "120 over 80" (or 120/80). The first ("top") number is called the systolic pressure. It is a measure of the pressure in your arteries as the heart beats. The second ("bottom") number is called the diastolic pressure. It is a measure of the pressure in your arteries as the heart relaxes between beats. What does my blood pressure reading mean? Blood  pressure is classified into four stages. Based on your blood pressure reading, your health care provider may use the following stages to determine what type of treatment, if any, is needed. Systolic pressure and diastolic pressure are measured in a unit called mm Hg. Normal  Systolic pressure: below 120.  Diastolic pressure: below 80. Prehypertension  Systolic pressure: 120-139.  Diastolic pressure: 80-89. Hypertension stage 1  Systolic pressure: 140-159.  Diastolic pressure: 90-99. Hypertension stage 2  Systolic pressure: 160 or above.  Diastolic pressure: 100 or above. What health risks are associated with hypertension? Managing your hypertension is an important responsibility. Uncontrolled hypertension can lead to:  A heart attack.  A stroke.  A weakened blood vessel (aneurysm).  Heart failure.  Kidney damage.  Eye damage.  Metabolic syndrome.  Memory and concentration problems. What changes can I make to manage my hypertension? Hypertension can be managed effectively by making lifestyle changes and possibly by taking medicines. Your health care provider will help you come up with a plan to bring your blood pressure within a normal range. Your plan should include the following: Monitoring  Monitor your blood pressure at home as told by your health care provider. Your personal target blood pressure may vary depending on your medical conditions, your age, and other factors.  Have your blood pressure rechecked as told by your health care provider. Lifestyle  Lose weight if necessary.  Get at least 30-45 minutes of aerobic exercise at least 4 times a week.  Do not use any products that contain nicotine or tobacco, such as cigarettes and   e-cigarettes. If you need help quitting, ask your health care provider.  Learn ways to reduce stress.  Control any chronic conditions, such as high cholesterol or diabetes. Eating and drinking  Follow the DASH diet. This diet  is high in fruits, vegetables, and whole grains. It is low in salt, red meat, and added sugars.  Keep your sodium intake below 2,300 mg per day.  Limit alcoholic beverages. Communication  Review all the medicines you take with your health care provider because there may be side effects or interactions.  Talk with your health care provider about your diet, exercise habits, and other lifestyle factors that may be contributing to hypertension.  See your health care provider regularly. Your health care provider can help you create and adjust your plan for managing hypertension. Will I need medicine to control my blood pressure? Your health care provider may prescribe medicine if lifestyle changes are not enough to get your blood pressure under control, and if one of the following is true:  You are 18-59 years of age, and your systolic blood pressure is 140 or higher.  You are 60 years of age or older, and your systolic blood pressure is 150 or higher.  Your diastolic blood pressure is 90 or higher.  You have diabetes, and your systolic blood pressure is over 140 or your diastolic blood pressure is over 90.  You have kidney disease, and your blood pressure is above 140/90.  You have heart disease or a history of stroke, and your blood pressure is 140/90 or higher. Take medicines only as told by your health care provider. Follow the directions carefully. Blood pressure medicines must be taken as prescribed. The medicine does not work as well when you skip doses. Skipping doses also puts you at risk for problems. Contact a health care provider if:  You think you are having a reaction to medicines you have taken.  You have repeated (recurrent) headaches.  You feel dizzy.  You have swelling in your ankles.  You have trouble with your vision. Get help right away if:  You develop a severe headache or confusion.  You have unusual weakness or numbness, or you feel faint.  You have  severe pain in your chest or abdomen.  You vomit repeatedly.  You have trouble breathing. This information is not intended to replace advice given to you by your health care provider. Make sure you discuss any questions you have with your health care provider. Document Released: 10/07/2011 Document Revised: 09/17/2015 Document Reviewed: 04/12/2015 Elsevier Interactive Patient Education  2017 Elsevier Inc.  

## 2016-01-31 LAB — COMPREHENSIVE METABOLIC PANEL
ALBUMIN: 4.6 g/dL (ref 3.6–4.8)
ALT: 13 IU/L (ref 0–32)
AST: 14 IU/L (ref 0–40)
Albumin/Globulin Ratio: 1.6 (ref 1.2–2.2)
Alkaline Phosphatase: 103 IU/L (ref 39–117)
BILIRUBIN TOTAL: 0.4 mg/dL (ref 0.0–1.2)
BUN / CREAT RATIO: 14 (ref 12–28)
BUN: 13 mg/dL (ref 8–27)
CO2: 20 mmol/L (ref 18–29)
CREATININE: 0.94 mg/dL (ref 0.57–1.00)
Calcium: 9.9 mg/dL (ref 8.7–10.3)
Chloride: 98 mmol/L (ref 96–106)
GFR calc non Af Amer: 65 mL/min/{1.73_m2} (ref >59)
GFR, EST AFRICAN AMERICAN: 75 mL/min/{1.73_m2} (ref >59)
GLOBULIN, TOTAL: 2.8 g/dL (ref 1.5–4.5)
GLUCOSE: 138 mg/dL — AB (ref 65–99)
Potassium: 4.5 mmol/L (ref 3.5–5.2)
SODIUM: 140 mmol/L (ref 134–144)
TOTAL PROTEIN: 7.4 g/dL (ref 6.0–8.5)

## 2016-01-31 LAB — LIPID PANEL
CHOLESTEROL TOTAL: 155 mg/dL (ref 100–199)
Chol/HDL Ratio: 3.2 ratio units (ref 0.0–4.4)
HDL: 48 mg/dL (ref >39)
LDL Calculated: 90 mg/dL (ref 0–99)
TRIGLYCERIDES: 83 mg/dL (ref 0–149)
VLDL Cholesterol Cal: 17 mg/dL (ref 5–40)

## 2016-01-31 LAB — MICROALBUMIN / CREATININE URINE RATIO
Creatinine, Urine: 50.2 mg/dL
MICROALB/CREAT RATIO: 14.9 mg/g{creat} (ref 0.0–30.0)
Microalbumin, Urine: 7.5 ug/mL

## 2016-01-31 LAB — TSH: TSH: 1.42 u[IU]/mL (ref 0.450–4.500)

## 2016-01-31 LAB — HEMOGLOBIN A1C
Est. average glucose Bld gHb Est-mCnc: 154 mg/dL
HEMOGLOBIN A1C: 7 % — AB (ref 4.8–5.6)

## 2016-02-10 ENCOUNTER — Telehealth: Payer: Self-pay

## 2016-02-10 NOTE — Telephone Encounter (Signed)
PT REQUESTING A CALL FROM DR Lozito BECAUSE SHE RECEIVED A BILL FOR $400 FOR LAB WORK,STATES SHE WAS TOLD SHE WAS ONLY BEING TESTED FOR HER SYNTHROID! SHE IS ALSO REQUESTING AN ANTIBIOTIC REFILL FOR HER SINUSES,I ADVISED HER ITS NOT OUR PROTOCL TO REFILL ANTIBIOTICS WITHOUT A REVISIT    BEST PHONE [phone]

## 2016-02-12 NOTE — Telephone Encounter (Signed)
Please advise 

## 2016-02-12 NOTE — Telephone Encounter (Signed)
Correct, no antibiotic refill without office visit.  I would suggest perhaps she try a [facility] E-visit for her sinuses.    In this particular situation because she has an unexpected lab expense Dewayne Hatchnn has had,  I would be willing to see if perhaps it would be safe, reasonable, and effective to treat her without an exam if she could give me more info. We can send her a link to MyChart on her email or text to sign up.  Then send an email about her how the antibiotic worked, how she has been since, and what her current symptoms are, why she thinks this is a bacterial sinus infection, what she has tried, etc to see if we could save her the exam expense this time.  I'm so sorry that we had a misunderstanding with such a huge consequence for her. I remember talking with pt that due to her fear of needles and to avoid repeat venipuncture fees and transport fees again at a visit in another 6 mos that I would get all her necessary labs updated at once so she didn't have to repeat them for another year as long as everything was stable - I told her we were going to check everything that was necessary for her medications and disease management for the year as otherwise she was going to be due for repeat labs in another 6 mos.  (We will likely need to do finger stick a1c in the office.)  I seem to remember our conversation about her blood work VERY differently than she did and I am very sorry about this. On the AVS she was given there is always a list of all the blood tests ordered which can be cancelled normally within an hour or two of drawn but after they are ran at that lab and resulted that can't be returned and we have no power to adjust the price as [facility] is an outside private entity.   The labs should be 50% off for self-pay so make sure that has been done - she should call [facility] to check. This is the lab work that she can plan on needing annually.  Thanks goodness she is dong a good job controlling her medical  problems and that her medications are stable as often pts need these every 4-6 mos.  Also bring in the bill to the next routine visit so we can see what things were most costly and try to optimize lab expenses in the future - e.g. perhaps ordering an ALT alone instead of the hepatic panel (although sometimes this does not change cost depending on how the lab is run).  In the future, I would advise that she ask me or the nurse or the lab to see the COSTS of the labs prior to or at the time of the visit - we can call up [facility] and have them run the order and then make decisions on what she can afford and what she needs to continue safe medical care so she doesn't have this surprise again.

## 2016-02-17 NOTE — Telephone Encounter (Signed)
See billing info from [facility]

## 2016-02-18 ENCOUNTER — Telehealth: Payer: Self-pay

## 2016-02-18 NOTE — Telephone Encounter (Signed)
Pt would like a call back from Dr. Clelia CroftShaw tomorrow about her blood test and antibodic   [facility] [phone]

## 2016-02-19 NOTE — Telephone Encounter (Signed)
Pt received lab results in the mail yesterday Advised- I will follow up with [facility] regarding 500.00 bill/ [facility] discount needs to be applied Pt is requesting medication RF Amoxicillin for chronic sinus infections. Advised she may need to schedule OV  Dr.Berkery, pt is asking for refill and states, you are fully aware of chronic sinus issues. Please advise

## 2016-02-24 MED ORDER — BUDESONIDE 32 MCG/ACT NA SUSP: 2.0000 | Freq: Every day | NASAL | 2 refills | Status: DC

## 2016-02-24 NOTE — Telephone Encounter (Signed)
Just having a lot of drainage and rhinitis. She does not want amoxicillin but something else Dr. Merla Richesoolittle used to give her to break it up but she does not remember what it is.  She did buy some [facility] brand for congestion from allergies without success.  Advised pt that I will try to go through her chart to figure out what she is talking about in the next few d - reviewed OV in Epic and the only thing she has been rx'ed was sudafed, augmentin, and loratadine in 2015.  Nothing about this sounds bacteria - no f/c/purulent rhinitis/severe focal pain so advised against repeated course of amoxicillin at this time. If she remembers what medication she found effective for her sinuses, please call. Advised cont mucinex, cetirizine, and nasal saline treatments for her chronic sinusitis. Can try rhinocort.  Reviewed need for routine labs to monitor effectiveness and potential side effects of medications. Since these were normal, ok to repeat in 1 yr other than POCT hgba1c more freq. Advised pt she can call [facility] to have self-pay discount applied.

## 2016-02-28 MED ORDER — LEVOTHYROXINE SODIUM 125 MCG PO TABS: 125.0000 ug | ORAL_TABLET | Freq: Every day | ORAL | 3 refills | Status: DC

## 2016-02-28 MED ORDER — BUPROPION HCL ER (XL) 300 MG PO TB24: 300.0000 mg | ORAL_TABLET | Freq: Every day | ORAL | 3 refills | Status: DC

## 2016-02-28 MED ORDER — METFORMIN HCL 1000 MG PO TABS: 1000.0000 mg | ORAL_TABLET | Freq: Two times a day (BID) | ORAL | 3 refills | Status: DC

## 2016-02-28 MED ORDER — LOSARTAN POTASSIUM 100 MG PO TABS: 100.0000 mg | ORAL_TABLET | Freq: Every day | ORAL | 1 refills | Status: DC

## 2016-02-28 MED ORDER — ATORVASTATIN CALCIUM 20 MG PO TABS: 20.0000 mg | ORAL_TABLET | Freq: Every day | ORAL | 3 refills | Status: DC

## 2016-03-12 ENCOUNTER — Telehealth: Payer: Self-pay

## 2016-03-12 NOTE — Telephone Encounter (Signed)
(  I TRIED TO ATTACH THIS MESSAGE WITH THE MESSAGE FROM JAN. 23, 2018, BUT IT WOULD NOT ATTACH). PATIENT WOULD  LIKE DR. Flury TO KNOW THAT SHE IS STILL HAVING A LOT OF SINUS DRAINAGE. SHE SAID DR. Bauserman TOLD HER IF SHE COULD REMEMBER WHAT DR. Merla Riches USE TO GIVE HER TO BREAK UP THE DRAINAGE THAT SHE WOULD CALL SOME INTO HER PHARMACY. SHE SAID SHE COULD NOT REMEMBER THE FULL NAME BUT THAT IT STARTS WITH A G. BEST PHONE [phone] (HOME) PHARMACY CHOICE IS [facility]  [facility]

## 2016-03-12 NOTE — Telephone Encounter (Signed)
GUAIFENESIN?  I DONT SEE THIS ON MED LIST, ONLY CETRIZINE.  OTC?  Rx?  SUGGESTION?

## 2016-03-12 NOTE — Telephone Encounter (Signed)
Please let pt know that after we talked I reviewed that last 5 years of records and didn't see anything that Dr. Merla Richesoolittle had used to treat congestion or sinus symptoms at all. I once again reviewed the past 5 years of records and there has been absolutely no medication she has been prescribed that starts with a G by anyone in Epic.   Since 2013, Dr. Merla Richesoolittle has only prescribed her alprazolam, atorvastatin, bupropion, clonazepam, levothyroxine, losartan, and metformin (Glucophage). The only thing she has been rx'ed for any type of infection in the past 5 years was sudafed, augmentin, and loratadine in 07/2012 from the ER. I have been recommending that she take otc mucinex (Guaifenesin - helps break-up mucous, an expectorant) for her sxs as well I can send in a guaifenesin-cough syrup or a guaifenesin-decongestant syrup if she would like.

## 2016-03-14 NOTE — Telephone Encounter (Signed)
Pt advised and will try mucinex today otc

## 2016-05-22 ENCOUNTER — Other Ambulatory Visit: Payer: Self-pay | Admitting: Family Medicine

## 2016-05-22 ENCOUNTER — Telehealth: Payer: Self-pay | Admitting: Family Medicine

## 2016-05-22 NOTE — Telephone Encounter (Signed)
Pt is requesting a med refill on ALPRAZolam (XANAX) 0.5 MG tablet [[id number]]   Pt contact phone number [phone]

## 2016-05-22 NOTE — Telephone Encounter (Signed)
Called to [facility]

## 2016-05-22 NOTE — Telephone Encounter (Signed)
This was filled by Clelia Croft today

## 2016-05-26 ENCOUNTER — Other Ambulatory Visit: Payer: Self-pay | Admitting: Family Medicine

## 2016-05-26 NOTE — Telephone Encounter (Signed)
This rx was called into [facility] for #60 with 1 refill on 05/22/16 by Clydie Braun after authorized by me.

## 2016-09-29 ENCOUNTER — Telehealth: Payer: Self-pay | Admitting: Family Medicine

## 2016-09-29 NOTE — Telephone Encounter (Signed)
Pt is requesting a call back from TindallShaw.  She did not want to tell me what it was for.  Please have Clelia CroftShaw advise. [phone]

## 2016-10-02 NOTE — Telephone Encounter (Signed)
Please call and try to get more info. Tell her the request came from me. Thanks.

## 2016-10-02 NOTE — Telephone Encounter (Signed)
Please advise, thank you./ S.Pawel Soules,CMA

## 2016-10-05 NOTE — Telephone Encounter (Signed)
Pt reports having increased anxiety and is requesting upping her dose of wellbutrin.

## 2016-10-05 NOTE — Telephone Encounter (Signed)
John, when you get a chance if you wouldn't mind calling this patient for me. Thanks

## 2016-10-05 NOTE — Telephone Encounter (Addendum)
wellbutrin can make anxiety worse since it can increase energy and dopamine - it is a straight anti-depressant and one of its side effects is worsening anxiety.  If pt needs a change in her medication - needs OV to discuss.

## 2016-10-08 NOTE — Telephone Encounter (Signed)
Spoke to pt this morning about medication change and she informed me that she will try for a few more week and see if the medication helps she is currently taking and if not she will call back and schedule and appointment.

## 2016-10-15 ENCOUNTER — Other Ambulatory Visit: Payer: Self-pay | Admitting: Family Medicine

## 2016-10-15 NOTE — Telephone Encounter (Signed)
Please advise 

## 2016-11-19 ENCOUNTER — Telehealth: Payer: Self-pay

## 2016-11-19 NOTE — Telephone Encounter (Signed)
Called patient to review overdue health maintenance.  Patient states that she has not had a mammogram or a pap smear in the past 2-3 years, however, she does not want to schedule a pap at this time.  Advised the patient that these screening tests are very important for our health, and if she doesn't come here, she should follow up with the GYN doctor.  She said she would do so.

## 2017-02-20 ENCOUNTER — Other Ambulatory Visit: Payer: Self-pay | Admitting: Family Medicine

## 2017-02-24 ENCOUNTER — Telehealth: Payer: Self-pay | Admitting: Family Medicine

## 2017-02-24 ENCOUNTER — Other Ambulatory Visit: Payer: Self-pay | Admitting: Family Medicine

## 2017-02-24 NOTE — Telephone Encounter (Signed)
Losartan refill request Last OV 01/30/16. [id number] [place]   [address]

## 2017-02-24 NOTE — Telephone Encounter (Signed)
Copied from CRM [id number]. Topic: Quick Communication - Rx Refill/Question >> Feb 24, 2017  2:04 PM Gerrianne ScalePayne, Milam Allbaugh L wrote: Medication: losartan (COZAAR) 100 MG tablet   Has the patient contacted their pharmacy? Yes.     (Agent: If no, request that the patient contact the pharmacy for the refill.)   Preferred Pharmacy (with phone number or street name): [facility] [address] - [place], [place] - [address] [phone] (Phone) [phone] (Fax)     Agent: Please be advised that RX refills may take up to 3 business days. We ask that you follow-up with your pharmacy.

## 2017-03-03 ENCOUNTER — Other Ambulatory Visit: Payer: Self-pay | Admitting: Family Medicine

## 2017-03-04 NOTE — Telephone Encounter (Signed)
Klonopin and Glucophage refill Last OV: 01/30/16 Last Refill:05/22/16 Pharmacy:[facility] Pt. Has appointment 03/12/17. Thanks.

## 2017-03-12 ENCOUNTER — Other Ambulatory Visit: Payer: Self-pay

## 2017-03-12 ENCOUNTER — Encounter: Payer: Self-pay | Admitting: Family Medicine

## 2017-03-12 ENCOUNTER — Ambulatory Visit: Payer: Self-pay | Admitting: Family Medicine

## 2017-03-12 VITALS — BP 138/60 | HR 103 | Temp 98.2°F | Resp 18 | Ht 68.0 in | Wt 272.8 lb

## 2017-03-12 DIAGNOSIS — E119 Type 2 diabetes mellitus without complications: Secondary | ICD-10-CM

## 2017-03-12 MED ORDER — ALPRAZOLAM 0.5 MG PO TABS: ORAL_TABLET | ORAL | 1 refills | Status: DC

## 2017-03-12 MED ORDER — LOSARTAN POTASSIUM 100 MG PO TABS: 100.0000 mg | ORAL_TABLET | Freq: Every day | ORAL | 0 refills | Status: DC

## 2017-03-12 MED ORDER — CLONAZEPAM 1 MG PO TABS: ORAL_TABLET | ORAL | 1 refills | Status: DC

## 2017-03-12 MED ORDER — LEVOTHYROXINE SODIUM 125 MCG PO TABS: 125.0000 ug | ORAL_TABLET | Freq: Every day | ORAL | 0 refills | Status: DC

## 2017-03-12 MED ORDER — BUPROPION HCL ER (XL) 300 MG PO TB24: 300.0000 mg | ORAL_TABLET | Freq: Every day | ORAL | 0 refills | Status: DC

## 2017-03-12 MED ORDER — ATORVASTATIN CALCIUM 20 MG PO TABS: 20.0000 mg | ORAL_TABLET | Freq: Every day | ORAL | 0 refills | Status: DC

## 2017-03-12 MED ORDER — METFORMIN HCL 1000 MG PO TABS: 1000.0000 mg | ORAL_TABLET | Freq: Two times a day (BID) | ORAL | 0 refills | Status: DC

## 2017-03-12 NOTE — Patient Instructions (Addendum)
We recommend that you schedule a mammogram for breast cancer screening. Typically, you do not need a referral to do this. Please contact a local imaging center to schedule your mammogram.  [facility] - [phone]  *ask for the Radiology Department [facility]) - [phone] or [phone]) [phone]  [facility] - [phone] [facility] - [phone] [facility] - [phone]  *ask for the Radiology Department [facility] - [phone]  *ask for the Radiology Department [facility] - [phone]  *ask for the Mammography Department [facility] - [phone]   IF you received an x-ray today, you will receive an invoice from [facility]. Please contact [facility] at [phone] with questions or concerns regarding your invoice.   IF you received labwork today, you will receive an invoice from [facility]. Please contact [facility] at [phone] with questions or concerns regarding your invoice.   Our billing staff will not be able to assist you with questions regarding bills from these companies.  You will be contacted with the lab results as soon as they are available. The fastest way to get your results is to activate your My Chart account. Instructions are located on the last page of this paperwork. If you have not heard from us regarding the results in 2 weeks, please contact this office.     Diabetes Mellitus and Nutrition When you have diabetes (diabetes mellitus), it is very important to have healthy eating habits because your blood sugar (glucose) levels are greatly affected by what you eat and drink. Eating healthy foods in the appropriate amounts, at about the same times every day, can help you:  Control your blood glucose.  Lower your risk of heart disease.  Improve your blood pressure.  Reach or maintain a healthy weight.  Every person with  diabetes is different, and each person has different needs for a meal plan. Your health care provider may recommend that you work with a diet and nutrition specialist (dietitian) to make a meal plan that is best for you. Your meal plan may vary depending on factors such as:  The calories you need.  The medicines you take.  Your weight.  Your blood glucose, blood pressure, and cholesterol levels.  Your activity level.  Other health conditions you have, such as heart or kidney disease.  How do carbohydrates affect me? Carbohydrates affect your blood glucose level more than any other type of food. Eating carbohydrates naturally increases the amount of glucose in your blood. Carbohydrate counting is a method for keeping track of how many carbohydrates you eat. Counting carbohydrates is important to keep your blood glucose at a healthy level, especially if you use insulin or take certain oral diabetes medicines. It is important to know how many carbohydrates you can safely have in each meal. This is different for every person. Your dietitian can help you calculate how many carbohydrates you should have at each meal and for snack. Foods that contain carbohydrates include:  Bread, cereal, rice, pasta, and crackers.  Potatoes and corn.  Peas, beans, and lentils.  Milk and yogurt.  Fruit and juice.  Desserts, such as cakes, cookies, ice cream, and candy.  How does alcohol affect me? Alcohol can cause a sudden decrease in blood glucose (hypoglycemia), especially if you use insulin or take certain oral diabetes medicines. Hypoglycemia can be a life-threatening condition. Symptoms of hypoglycemia (sleepiness, dizziness, and confusion) are similar to symptoms of  having too much alcohol. If your health care provider says that alcohol is safe for you, follow these guidelines:  Limit alcohol intake to no more than 1 drink per day for nonpregnant women and 2 drinks per day for men. One drink equals  12 oz of beer, 5 oz of wine, or 1 oz of hard liquor.  Do not drink on an empty stomach.  Keep yourself hydrated with water, diet soda, or unsweetened iced tea.  Keep in mind that regular soda, juice, and other mixers may contain a lot of sugar and must be counted as carbohydrates.  What are tips for following this plan? Reading food labels  Start by checking the serving size on the label. The amount of calories, carbohydrates, fats, and other nutrients listed on the label are based on one serving of the food. Many foods contain more than one serving per package.  Check the total grams (g) of carbohydrates in one serving. You can calculate the number of servings of carbohydrates in one serving by dividing the total carbohydrates by 15. For example, if a food has 30 g of total carbohydrates, it would be equal to 2 servings of carbohydrates.  Check the number of grams (g) of saturated and trans fats in one serving. Choose foods that have low or no amount of these fats.  Check the number of milligrams (mg) of sodium in one serving. Most people should limit total sodium intake to less than 2,300 mg per day.  Always check the nutrition information of foods labeled as "low-fat" or "nonfat". These foods may be higher in added sugar or refined carbohydrates and should be avoided.  Talk to your dietitian to identify your daily goals for nutrients listed on the label. Shopping  Avoid buying canned, premade, or processed foods. These foods tend to be high in fat, sodium, and added sugar.  Shop around the outside edge of the grocery store. This includes fresh fruits and vegetables, bulk grains, fresh meats, and fresh dairy. Cooking  Use low-heat cooking methods, such as baking, instead of high-heat cooking methods like deep frying.  Cook using healthy oils, such as olive, canola, or sunflower oil.  Avoid cooking with butter, cream, or high-fat meats. Meal planning  Eat meals and snacks  regularly, preferably at the same times every day. Avoid going long periods of time without eating.  Eat foods high in fiber, such as fresh fruits, vegetables, beans, and whole grains. Talk to your dietitian about how many servings of carbohydrates you can eat at each meal.  Eat 4-6 ounces of lean protein each day, such as lean meat, chicken, fish, eggs, or tofu. 1 ounce is equal to 1 ounce of meat, chicken, or fish, 1 egg, or 1/4 cup of tofu.  Eat some foods each day that contain healthy fats, such as avocado, nuts, seeds, and fish. Lifestyle   Check your blood glucose regularly.  Exercise at least 30 minutes 5 or more days each week, or as told by your health care provider.  Take medicines as told by your health care provider.  Do not use any products that contain nicotine or tobacco, such as cigarettes and e-cigarettes. If you need help quitting, ask your health care provider.  Work with a Veterinary surgeon or diabetes educator to identify strategies to manage stress and any emotional and social challenges. What are some questions to ask my health care provider?  Do I need to meet with a diabetes educator?  Do I need to meet  with a dietitian?  What number can I call if I have questions?  When are the best times to check my blood glucose? Where to find more information:  American Diabetes Association: diabetes.org/food-and-fitness/food  Academy of Nutrition and Dietetics: https://www.vargas.com/  [employer] of Diabetes and Digestive and Kidney Diseases (NIH): FindJewelers.cz Summary  A healthy meal plan will help you control your blood glucose and maintain a healthy lifestyle.  Working with a diet and nutrition specialist (dietitian) can help you make a meal plan that is best for you.  Keep in mind that carbohydrates and alcohol have immediate effects on your  blood glucose levels. It is important to count carbohydrates and to use alcohol carefully. This information is not intended to replace advice given to you by your health care provider. Make sure you discuss any questions you have with your health care provider. Document Released: 10/09/2004 Document Revised: 02/17/2016 Document Reviewed: 02/17/2016 Elsevier Interactive Patient Education  2018 [employer].   Tips for Eating Away From Home If You Have Diabetes Controlling your level of blood glucose, also known as blood sugar, can be challenging. It can be even more difficult when you do not prepare your own meals. The following tips can help you manage your diabetes when you eat away from home. Planning ahead Plan ahead if you know you will be eating away from home:  Ask your health care provider how to time meals and medicine if you are taking insulin.  Make a list of restaurants near you that offer healthy choices. If they have a carry-out menu, take it home and plan what you will order ahead of time.  Look up the restaurant you want to eat at online. Many chain and fast-food restaurants list nutritional information online. Use this information to choose the healthiest options and to calculate how many carbohydrates will be in your meal.  Use a carbohydrate-counting book or mobile app to look up the carbohydrate content and serving size of the foods you want to eat.  Become familiar with serving sizes and learn to recognize how many servings are in a portion. This will allow you to estimate how many carbohydrates you can eat.  Free foods A "free food" is any food or drink that has less than 5 g of carbohydrates per serving. Free foods include:  Many vegetables.  Hard boiled eggs.  Nuts or seeds.  Olives.  Cheeses.  Meats.  These types of foods make good appetizer choices and are often available at salad bars. Lemon juice, vinegar, or a low-calorie salad dressing of fewer than  20 calories per serving can be used as a "free" salad dressing. Choices to reduce carbohydrates  Substitute nonfat sweetened yogurt with a sugar-free yogurt. Yogurt made from soy milk may also be used, but you will still want a sugar-free or plain option to choose a lower carbohydrate amount.  Ask your server to take away the bread basket or chips from your table.  Order fresh fruit. A salad bar often offers fresh fruit choices. Avoid canned fruit because it is usually packed in sugar or syrup.  Order a salad, and eat it without dressing. Or, create a "free" salad dressing.  Ask for substitutions. For example, instead of [place] fries, request an order of a vegetable such as salad, green beans, or broccoli. Other tips  If you take insulin, take the insulin once your food arrives to your table. This will ensure your insulin and food are timed correctly.  Ask your  server about the portion size before your order, and ask for a take-out box if the portion has more servings than you should have. When your food comes, leave the amount you should have on the plate, and put the rest in the take-out box.  Consider splitting an entree with someone and ordering a side salad. This information is not intended to replace advice given to you by your health care provider. Make sure you discuss any questions you have with your health care provider. Document Released: 01/12/2005 Document Revised: 06/20/2015 Document Reviewed: 04/11/2013 Elsevier Interactive Patient Education  [employer].

## 2017-03-12 NOTE — Progress Notes (Signed)
Subjective:  By signing my name below, I, Jasmine Long, attest that this documentation has been prepared under the direction and in the presence of Jasmine Sorenson, MD Electronically Signed: Charline Bills, ED Scribe 03/12/2017 at 3:09 PM.   Patient ID: Jasmine Long, female    DOB: [birth year], 67 y.o.   MRN: [id number]  Chief Complaint  Patient presents with  . Medication Refill    All meds, Pt states she has been without BPs for 1 week.   HPI STARNISHA CACCIOLA is a 67 y.o. female who presents to Primary Care at [facility] for medication refill. Filled her alprazolam written 9/22 0.5 #60 on 9/22 and 1/26. Filed her klonopin last written 4/27 1 mg #30 on 4/28 and 8/28. Of previous script of alprazolam written 5/1 filled 5/1 and 8/22. No other controlled meds from any other providers. Only using [employer]. Averages 2 lorazepam meq per day. Pt is self pay so always declines lab testing unless 100% necessary. Last labs drawn 1 yr prior when A1C was 7.0. Pt will likely start on medicare within the next 6 wks. Last visit here was 01/30/16. She has requested refills on losartan, clonazepam, metformin which were all declined since she had not been seen in over 1 yr. Pt did call in 4 months ago stating she had worsening anxiety and asked if she could increase wellbutrin but was advised that this would worsen anxiety.  Pt plans to get medicare advantage in ~6 wks. Pt states 2/5 is the anniversary of Jasmine Long's death. She has been taking 1-2 xanax tabs/day since she is cleaning out Jasmine Long's closet.  DM Pt does not check her blood glucose outside of the office. Reports that she was initially taking metformin 1 tab qd as she misread the instructions, but she is now taking metformin as prescribed. Denies nausea or any side-effects. Pt reports that she does not consume a lot of bread or pasta but she loves meat. Admits that she was eating [employer] candy bars often and drinking a lot of sweet tea a few wks ago.  Past  Medical History:  Diagnosis Date  . Allergy   . Anxiety   . Depression   . Heart murmur   . Thyroid disease   . Ulcer    Current Outpatient Medications on File Prior to Visit  Medication Sig Dispense Refill  . ALPRAZolam (XANAX) 0.5 MG tablet TAKE 1 TO 2 TABLETS BY MOUTH TWICE DAILY FOR ANXIETY 60 tablet 1  . atorvastatin (LIPITOR) 20 MG tablet Take 1 tablet (20 mg total) by mouth daily. 90 tablet 3  . buPROPion (WELLBUTRIN XL) 300 MG 24 hr tablet Take 1 tablet (300 mg total) by mouth daily. 90 tablet 3  . clonazePAM (KLONOPIN) 1 MG tablet TAKE 1 TABLET BY MOUTH AT BEDTIME FOR SLEEP OR ANXIETY 30 tablet 1  . levothyroxine (SYNTHROID, LEVOTHROID) 125 MCG tablet Take 1 tablet (125 mcg total) by mouth daily before breakfast. 90 tablet 3  . losartan (COZAAR) 100 MG tablet Take 1 tablet (100 mg total) by mouth daily. 90 tablet 1  . metFORMIN (GLUCOPHAGE) 1000 MG tablet Take 1 tablet (1,000 mg total) by mouth 2 (two) times daily with a meal. 180 tablet 3   No current facility-administered medications on file prior to visit.    No Known Allergies   Review of Systems  Gastrointestinal: Negative for nausea.  Psychiatric/Behavioral: The patient is nervous/anxious.       Objective:   Physical Exam  Constitutional:  She is oriented to person, place, and time. She appears well-developed and well-nourished. No distress.  HENT:  Head: Normocephalic and atraumatic.  Eyes: Conjunctivae and EOM are normal.  Neck: Neck supple. No tracheal deviation present.  Cardiovascular: Normal rate, regular rhythm and normal heart sounds.  Pulmonary/Chest: Effort normal and breath sounds normal. No respiratory distress.  Musculoskeletal: Normal range of motion.  Neurological: She is alert and oriented to person, place, and time.  Skin: Skin is warm and dry.  Psychiatric: She has a normal mood and affect. Her behavior is normal.  Nursing note and vitals reviewed.  BP 138/60 (BP Location: Left Arm, Patient  Position: Sitting, Cuff Size: Large)   Pulse (!) 103   Temp 98.2 F (36.8 C) (Oral)   Resp 18   Ht 5\' 8"  (1.727 m)   Wt 272 lb 12.8 oz (123.7 kg)   SpO2 96%   BMI 41.48 kg/m       Assessment & Plan:   1. Type 2 diabetes mellitus without complication, without long-term current use of insulin (HCC)   Pt is currently self-pay w/o insurance. Is due for all labs. She is turning 67 yo in 6 weeks and will start on Medicare - planning to get supplement. Agreed to provide short term refill (3 mo) of all her medications but then pt needs to RTC in 2-3 mos for recheck with labs for any additional refills.   Orders Placed This Encounter  Procedures  . HM DIABETES FOOT EXAM  Was not documented so needs to be repeated at next OV  Meds ordered this encounter  Medications  . metFORMIN (GLUCOPHAGE) 1000 MG tablet    Sig: Take 1 tablet (1,000 mg total) by mouth 2 (two) times daily with a meal.    Dispense:  180 tablet    Refill:  0  . losartan (COZAAR) 100 MG tablet    Sig: Take 1 tablet (100 mg total) by mouth daily.    Dispense:  90 tablet    Refill:  0  . levothyroxine (SYNTHROID, LEVOTHROID) 125 MCG tablet    Sig: Take 1 tablet (125 mcg total) by mouth daily before breakfast.    Dispense:  90 tablet    Refill:  0  . clonazePAM (KLONOPIN) 1 MG tablet    Sig: TAKE 1 TABLET BY MOUTH AT BEDTIME FOR SLEEP OR ANXIETY    Dispense:  30 tablet    Refill:  1  . buPROPion (WELLBUTRIN XL) 300 MG 24 hr tablet    Sig: Take 1 tablet (300 mg total) by mouth daily.    Dispense:  90 tablet    Refill:  0  . atorvastatin (LIPITOR) 20 MG tablet    Sig: Take 1 tablet (20 mg total) by mouth daily.    Dispense:  90 tablet    Refill:  0  . ALPRAZolam (XANAX) 0.5 MG tablet    Sig: TAKE 1 TO 2 TABLETS BY MOUTH TWICE DAILY FOR ANXIETY    Dispense:  60 tablet    Refill:  1    I personally performed the services described in this documentation, which was scribed in my presence. The recorded information  has been reviewed and considered, and addended by me as needed.   Jasmine Long, M.D.  Primary Care at [facility] [address] [place], [place] [address] [phone] phone [phone] fax  03/15/17 12:42 AM

## 2017-04-01 ENCOUNTER — Encounter: Payer: Self-pay | Admitting: Family Medicine

## 2017-04-01 ENCOUNTER — Other Ambulatory Visit: Payer: Self-pay

## 2017-04-01 ENCOUNTER — Ambulatory Visit (INDEPENDENT_AMBULATORY_CARE_PROVIDER_SITE_OTHER): Payer: Self-pay | Admitting: Family Medicine

## 2017-04-01 ENCOUNTER — Telehealth: Payer: Self-pay | Admitting: Family Medicine

## 2017-04-01 VITALS — BP 140/60 | HR 97 | Temp 98.9°F | Ht 66.5 in | Wt 269.4 lb

## 2017-04-01 DIAGNOSIS — R197 Diarrhea, unspecified: Secondary | ICD-10-CM

## 2017-04-01 NOTE — Progress Notes (Deleted)
3/7/20192:16 PM  Jasmine Long September 11, 1950, 67 y.o. female [id number]  Chief Complaint  Patient presents with  . Emesis    Pt prepared a burger that wasn't all the way. After eating it, she got sick. Having nausea and diarrhea    HPI:   Patient is a 67 y.o. female with past medical history significant for *** who presents today for ***  Depression screen [facility] 2/9 04/01/2017 03/12/2017 01/30/2016  Decreased Interest 0 0 1  Down, Depressed, Hopeless 0 0 2  PHQ - 2 Score 0 0 3  Altered sleeping - - 3  Tired, decreased energy - - 0  Change in appetite - - 2  Feeling bad or failure about yourself  - - 0  Trouble concentrating - - 0  Moving slowly or fidgety/restless - - 0  Suicidal thoughts - - 0  PHQ-9 Score - - 8  Difficult doing work/chores - - Not difficult at all    No Known Allergies  Prior to Admission medications   Medication Sig Start Date End Date Taking? Authorizing Provider  ALPRAZolam Prudy Feeler) 0.5 MG tablet TAKE 1 TO 2 TABLETS BY MOUTH TWICE DAILY FOR ANXIETY 03/12/17  Yes Sherren Mocha, MD  buPROPion (WELLBUTRIN XL) 300 MG 24 hr tablet Take 1 tablet (300 mg total) by mouth daily. 03/12/17  Yes Sherren Mocha, MD  clonazePAM (KLONOPIN) 1 MG tablet TAKE 1 TABLET BY MOUTH AT BEDTIME FOR SLEEP OR ANXIETY 03/12/17  Yes Sherren Mocha, MD  levothyroxine (SYNTHROID, LEVOTHROID) 125 MCG tablet Take 1 tablet (125 mcg total) by mouth daily before breakfast. 03/12/17  Yes Sherren Mocha, MD  losartan (COZAAR) 100 MG tablet Take 1 tablet (100 mg total) by mouth daily. 03/12/17  Yes Sherren Mocha, MD  metFORMIN (GLUCOPHAGE) 1000 MG tablet Take 1 tablet (1,000 mg total) by mouth 2 (two) times daily with a meal. 03/12/17  Yes Sherren Mocha, MD  atorvastatin (LIPITOR) 20 MG tablet Take 1 tablet (20 mg total) by mouth daily. Patient not taking: Reported on 04/01/2017 03/12/17   Sherren Mocha, MD    Past Medical History:  Diagnosis Date  . Allergy   . Anxiety   . Depression   . Heart murmur   . Thyroid disease    . Ulcer     Past Surgical History:  Procedure Laterality Date  . BREAST SURGERY    . CESAREAN SECTION    . CHOLECYSTECTOMY    . NASAL SEPTUM SURGERY      Social History   Tobacco Use  . Smoking status: Current Every Day Smoker    Packs/day: 1.50    Years: 40.00    Pack years: 60.00    Types: Cigarettes    Last attempt to quit: 01/20/2012    Years since quitting: 5.2  . Smokeless tobacco: Never Used  . Tobacco comment: Pt doing E-cigarette as of 01/20/12  Substance Use Topics  . Alcohol use: No    History reviewed. No pertinent family history.  ROS   OBJECTIVE:  Blood pressure 140/60, pulse 97, temperature 98.9 F (37.2 C), temperature source Oral, height 5' 6.5" (1.689 m), weight 269 lb 6.4 oz (122.2 kg), SpO2 100 %.  Physical Exam  No results found for this or any previous visit (from the past 24 hour(s)).  No results found.   ASSESSMENT and PLAN  ***  No Follow-up on file.    Myles Lipps, MD Primary Care at [facility] [address]  [address] [place], [place] [address] Ph.  [phone] Fax [phone]

## 2017-04-01 NOTE — Patient Instructions (Signed)
     IF you received an x-ray today, you will receive an invoice from [facility]. Please contact [facility] at [phone] with questions or concerns regarding your invoice.   IF you received labwork today, you will receive an invoice from [facility]. Please contact [facility] at [phone] with questions or concerns regarding your invoice.   Our billing staff will not be able to assist you with questions regarding bills from these companies.  You will be contacted with the lab results as soon as they are available. The fastest way to get your results is to activate your My Chart account. Instructions are located on the last page of this paperwork. If you have not heard from us regarding the results in 2 weeks, please contact this office.     

## 2017-04-01 NOTE — Telephone Encounter (Signed)
Called and left a VM reminding pt of their apt today. . I advised of the time, building number and time regulations. °

## 2017-04-01 NOTE — Progress Notes (Signed)
Subjective:    Patient ID: Jasmine Long, female    DOB: 1950-10-25, 67 y.o.   MRN: [id number]   Chief Complaint  Patient presents with  . Diarrhea    Pt prepared a burger that wasn't all the way. After eating it, she got sick. Having nausea and diarrhea. Also, having hot flashes    HPI Patient presents for evaluation of intermittent diarrhea since 03/25/2017. Patient states that she ate a burger that "wasn't done the whole way" on 03/25/2017, and afterwards she started having diarrhea and emesis. She vomited only once, small amount, no blood. She has continued to have diarrhea. Patient states that she had no blood or mucus in her stool, that her stool is not black or tarry and that she has not had any problems with urination. Patient states that the diarrhea is intermittent and seems to be triggered by certain food, such as fried cabbage she ate yesterday.  Patient states that frequency and intensity of diarrhea is diminishing. She has not had any episodes today, but she also has not eaten.  Patient's main concern is that she might have an ulcer. She had one when she was in college, no issues since. She reports uncomfortable lower abd sensation, no frank pain, no epigastric pain.   Patient Active Problem List   Diagnosis Date Noted  . Hyperlipidemia 12/11/2015  . Insomnia 12/11/2015  . Panic attack as reaction to stress 12/11/2015  . Tobacco abuse 12/11/2015  . Chronic sinusitis 12/11/2015  . Diabetes (HCC) 05/15/2015  . Hypothyroid 04/29/2011  . BMI 40.0-44.9, adult (HCC) 04/29/2011  . Depression with anxiety 04/29/2011  . GERD (gastroesophageal reflux disease) 04/29/2011  . 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  . Nicotine addiction 04/29/2011  . Hypertension 04/29/2011  . Murmur, cardiac 04/29/2011   No Known Allergies   Prior to Admission medications   Medication Sig Start Date End Date Taking? Authorizing Provider  ALPRAZolam Prudy Feeler) 0.5 MG tablet TAKE 1 TO 2 TABLETS BY MOUTH TWICE  DAILY FOR ANXIETY 03/12/17  Yes Sherren Mocha, MD  buPROPion (WELLBUTRIN XL) 300 MG 24 hr tablet Take 1 tablet (300 mg total) by mouth daily. 03/12/17  Yes Sherren Mocha, MD  clonazePAM (KLONOPIN) 1 MG tablet TAKE 1 TABLET BY MOUTH AT BEDTIME FOR SLEEP OR ANXIETY 03/12/17  Yes Sherren Mocha, MD  levothyroxine (SYNTHROID, LEVOTHROID) 125 MCG tablet Take 1 tablet (125 mcg total) by mouth daily before breakfast. 03/12/17  Yes Sherren Mocha, MD  losartan (COZAAR) 100 MG tablet Take 1 tablet (100 mg total) by mouth daily. 03/12/17  Yes Sherren Mocha, MD  metFORMIN (GLUCOPHAGE) 1000 MG tablet Take 1 tablet (1,000 mg total) by mouth 2 (two) times daily with a meal. 03/12/17  Yes Sherren Mocha, MD    Past Medical History:  Diagnosis Date  . Allergy   . Anxiety   . Depression   . Heart murmur   . Thyroid disease   . Ulcer     Social History   Socioeconomic History  . Marital status: Divorced    Spouse name: Not on file  . Number of children: Not on file  . Years of education: Not on file  . Highest education level: Not on file  Social Needs  . Financial resource strain: Not on file  . Food insecurity - worry: Not on file  . Food insecurity - inability: Not on file  . Transportation needs - medical: Not on file  . Transportation needs -  non-medical: Not on file  Occupational History  . Occupation: Unemployed  Tobacco Use  . Smoking status: Current Every Day Smoker    Packs/day: 1.50    Years: 40.00    Pack years: 60.00    Types: Cigarettes    Last attempt to quit: 01/20/2012    Years since quitting: 5.2  . Smokeless tobacco: Never Used  . Tobacco comment: Pt doing E-cigarette as of 01/20/12  Substance and Sexual Activity  . Alcohol use: No  . Drug use: No  . Sexual activity: No  Other Topics Concern  . Not on file  Social History Narrative   Patient on worker's compensation for an injury sustained on the job 5 years ago.   Family History  Problem Relation Age of Onset  . Lung cancer Mother    . Dementia Father   . Atrial fibrillation Father    Past Surgical History:  Procedure Laterality Date  . BREAST SURGERY    . CESAREAN SECTION    . CHOLECYSTECTOMY    . NASAL SEPTUM SURGERY      Review of Systems  Constitutional: Negative.  Negative for activity change, appetite change and fatigue.  HENT:       "I have chronic sinus problems."  Respiratory: Positive for cough. Negative for chest tightness and shortness of breath.   Cardiovascular: Positive for palpitations (Occasionally.). Negative for chest pain.  Gastrointestinal: Positive for abdominal pain, diarrhea, nausea and vomiting. Negative for anal bleeding, blood in stool, constipation and rectal pain.  Endocrine: Negative.  Negative for polydipsia and polyuria.  Genitourinary: Negative for difficulty urinating, frequency and urgency.  Neurological: Positive for headaches. Negative for dizziness and light-headedness.      Objective:   Physical Exam  Constitutional: She is oriented to person, place, and time. She appears well-developed and well-nourished.  BP 140/60 (BP Location: Left Arm, Patient Position: Sitting, Cuff Size: Large)   Pulse 97   Temp 98.9 F (37.2 C) (Oral)   Ht 5' 6.5" (1.689 m)   Wt 269 lb 6.4 oz (122.2 kg)   SpO2 100%   BMI 42.83 kg/m   HENT:  Head: Normocephalic and atraumatic.  Right Ear: External ear normal.  Left Ear: External ear normal.  Nose: Nose normal.  Mouth/Throat: Oropharynx is clear and moist.  Eyes: Conjunctivae and EOM are normal. Pupils are equal, round, and reactive to light.  Neck: Normal range of motion. Neck supple.  Cardiovascular: Normal rate, regular rhythm, normal heart sounds and intact distal pulses. Exam reveals no friction rub.  No murmur heard. Radial pulses 2+ bilaterally.  Pulmonary/Chest: Effort normal and breath sounds normal. She has no wheezes. She has no rales.  Abdominal: Soft. Bowel sounds are normal. She exhibits no mass. There is tenderness  (Periumbilical tenderness to palpation. ). There is no rebound and no guarding.  Neurological: She is alert and oriented to person, place, and time. She has normal reflexes.  Skin: Skin is warm and dry.  Normal skin turgor.  Psychiatric: She has a normal mood and affect.      Assessment & Plan:   1. Diarrhea, unspecified type Discussed with patient that symptoms and exam most suggestive of gastroenteritis, most likely from uncooked meat, and not ulcers. Discussed that given mild symptoms, stool cx and abx not indicated at this time. Continue with supportive measures and BRAT diet. RTC precautions reviewed.   Return if symptoms worsen or fail to improve.

## 2017-04-26 ENCOUNTER — Telehealth: Payer: Self-pay | Admitting: Family Medicine

## 2017-04-26 DIAGNOSIS — G43901 Migraine, unspecified, not intractable, with status migrainosus: Secondary | ICD-10-CM

## 2017-04-26 NOTE — Telephone Encounter (Signed)
Copied from CRM [id number]. Topic: Referral - Request >> Apr 26, 2017 11:53 AM [name], Jasmine Long wrote: Reason for CRM: pt is requesting referral to Dr. Estella Huskunheim at [facility] - pt has [employer] Medicare Replacement insurance now ID# [id number] - She was seeing Dr. Amelia JoEliot Lewit for botox for migraines but he has retired. Please notify pt when referral is placed.

## 2017-04-27 NOTE — Telephone Encounter (Signed)
Please advise. Thanks.  

## 2017-04-27 NOTE — Telephone Encounter (Signed)
Copied from CRM [id number]. Topic: Referral - Request >> Apr 26, 2017 11:53 AM [name], Kristie S wrote: Reason for CRM: pt is requesting referral to [facility] - pt has [employer] Medicare Replacement insurance now ID# [id number] - She was seeing Dr. Amelia JoEliot Lewit for botox for migraines but he has retired. Please notify pt when referral is placed. >> Apr 27, 2017 11:16 AM Welma Mccombs, Tresa EndoKelly, NT wrote: Patient calling again because she would like to have a new referral to the Neurological at [facility]. If someone could give her a call back about this at [phone]

## 2017-04-27 NOTE — Telephone Encounter (Signed)
Spoke with pt and she said she would like to try [facility], [facility], and [facility] to see who could get her in quickest. She also mentioned the [facility] if this could be an option. Pt said she is needing the botox injections and is having headaches that are 20 times worse than a migraine. She said this can keep her from driving and lasts 3-5 days. Pt said she is in a lot of pain and is desperate to get in somewhere for this. I told pt I would send message to Dr. Clelia CroftShaw and we would let her know how she would like to proceed. Thanks!

## 2017-04-27 NOTE — Telephone Encounter (Signed)
Pt called back and stated [facility] and if referral can be marked as urgent, this will help her get in before May or June.

## 2017-04-28 NOTE — Telephone Encounter (Signed)
Pt called and stated she needs a referral for neurology to [facility] she is already scheduled on 05/11/17 at 11am.

## 2017-04-29 DIAGNOSIS — R079 Chest pain, unspecified: Secondary | ICD-10-CM | POA: Diagnosis not present

## 2017-04-29 DIAGNOSIS — F17210 Nicotine dependence, cigarettes, uncomplicated: Secondary | ICD-10-CM | POA: Diagnosis not present

## 2017-04-29 DIAGNOSIS — R072 Precordial pain: Secondary | ICD-10-CM | POA: Diagnosis not present

## 2017-04-29 DIAGNOSIS — R61 Generalized hyperhidrosis: Secondary | ICD-10-CM | POA: Diagnosis not present

## 2017-04-29 DIAGNOSIS — R0602 Shortness of breath: Secondary | ICD-10-CM | POA: Diagnosis not present

## 2017-04-29 DIAGNOSIS — M549 Dorsalgia, unspecified: Secondary | ICD-10-CM | POA: Diagnosis not present

## 2017-04-29 DIAGNOSIS — Z5320 Procedure and treatment not carried out because of patient's decision for unspecified reasons: Secondary | ICD-10-CM | POA: Diagnosis not present

## 2017-04-29 DIAGNOSIS — R42 Dizziness and giddiness: Secondary | ICD-10-CM | POA: Diagnosis not present

## 2017-04-30 ENCOUNTER — Ambulatory Visit (INDEPENDENT_AMBULATORY_CARE_PROVIDER_SITE_OTHER): Payer: Medicare HMO | Admitting: Emergency Medicine

## 2017-04-30 ENCOUNTER — Encounter: Payer: Self-pay | Admitting: Emergency Medicine

## 2017-04-30 VITALS — BP 132/76 | HR 102 | Temp 98.9°F | Resp 17 | Ht 66.5 in | Wt 263.0 lb

## 2017-04-30 DIAGNOSIS — I10 Essential (primary) hypertension: Secondary | ICD-10-CM | POA: Diagnosis not present

## 2017-04-30 DIAGNOSIS — E119 Type 2 diabetes mellitus without complications: Secondary | ICD-10-CM

## 2017-04-30 DIAGNOSIS — Z720 Tobacco use: Secondary | ICD-10-CM | POA: Diagnosis not present

## 2017-04-30 DIAGNOSIS — Z8711 Personal history of peptic ulcer disease: Secondary | ICD-10-CM

## 2017-04-30 DIAGNOSIS — R1013 Epigastric pain: Secondary | ICD-10-CM | POA: Diagnosis not present

## 2017-04-30 DIAGNOSIS — R079 Chest pain, unspecified: Secondary | ICD-10-CM

## 2017-04-30 MED ORDER — NITROGLYCERIN 0.4 MG SL SUBL
.40 | SUBLINGUAL_TABLET | SUBLINGUAL | Status: DC
Start: ? — End: 2017-04-30

## 2017-04-30 NOTE — Patient Instructions (Addendum)
IF you received an x-ray today, you will receive an invoice from [facility]. Please contact [facility] at [phone] with questions or concerns regarding your invoice.   IF you received labwork today, you will receive an invoice from [facility]. Please contact [facility] at [phone] with questions or concerns regarding your invoice.   Our billing staff will not be able to assist you with questions regarding bills from these companies.  You will be contacted with the lab results as soon as they are available. The fastest way to get your results is to activate your My Chart account. Instructions are located on the last page of this paperwork. If you have not heard from us regarding the results in 2 weeks, please contact this office.      Peptic Ulcer A peptic ulcer is a painful sore in the lining of your esophagus, stomach, or the first part of your small intestine. You may have pain in the area between your chest and your belly button. The most common causes of an ulcer are:  An infection.  Using certain pain medicines too often or too much.  Follow these instructions at home:  Avoid alcohol.  Avoid caffeine.  Do not use any tobacco products. These include cigarettes, chewing tobacco, and e-cigarettes. If you need help quitting, ask your doctor.  Take over-the-counter and prescription medicines only as told by your doctor. Do not stop or change your medicines unless you talk with your doctor about it first.  Keep all follow-up visits as told by your doctor. This is important. Contact a doctor if:  You do not get better in 7 days after you start treatment.  You keep having an upset stomach (indigestion) or heartburn. Get help right away if:  You have sudden, sharp pain in your belly (abdomen).  You have lasting belly pain.  You have bloody poop (stool) or black, tarry poop.  You throw up (vomit) blood. It may look like coffee grounds.  You feel  light-headed or feel like you may pass out (faint).  You get weak.  You get sweaty or feel sticky and cold to the touch (clammy). This information is not intended to replace advice given to you by your health care provider. Make sure you discuss any questions you have with your health care provider. Document Released: 04/08/2009 Document Revised: 05/29/2015 Document Reviewed: 10/13/2014 Elsevier Interactive Patient Education  2018 [employer]  Nonspecific Chest Pain Chest pain can be caused by many different conditions. There is a chance that your pain could be related to something serious, such as a heart attack or a blood clot in your lungs. Chest pain can also be caused by conditions that are not life-threatening. If you have chest pain, it is very important to follow up with your doctor. Follow these instructions at home: Medicines  If you were prescribed an antibiotic medicine, take it as told by your doctor. Do not stop taking the antibiotic even if you start to feel better.  Take over-the-counter and prescription medicines only as told by your doctor. Lifestyle  Do not use any products that contain nicotine or tobacco, such as cigarettes and e-cigarettes. If you need help quitting, ask your doctor.  Do not drink alcohol.  Make lifestyle changes as told by your doctor. These may include: ? Getting regular exercise. Ask your doctor for some activities that are safe for you. ? Eating a heart-healthy diet. A diet specialist (dietitian) can help you to learn healthy eating  options. ? Staying at a healthy weight. ? Managing diabetes, if needed. ? Lowering your stress, as with deep breathing or spending time in nature. General instructions  Avoid any activities that make you feel chest pain.  If your chest pain is because of heartburn: ? Raise (elevate) the head of your bed about 6 inches (15 cm). You can do this by putting blocks under the bed legs at the head of the bed. ? Do  not sleep with extra pillows under your head. That does not help heartburn.  Keep all follow-up visits as told by your doctor. This is important. This includes any further testing if your chest pain does not go away. Contact a doctor if:  Your chest pain does not go away.  You have a rash with blisters on your chest.  You have a fever.  You have chills. Get help right away if:  Your chest pain is worse.  You have a cough that gets worse, or you cough up blood.  You have very bad (severe) pain in your belly (abdomen).  You are very weak.  You pass out (faint).  You have either of these for no clear reason: ? Sudden chest discomfort. ? Sudden discomfort in your arms, back, neck, or jaw.  You have shortness of breath at any time.  You suddenly start to sweat, or your skin gets clammy.  You feel sick to your stomach (nauseous).  You throw up (vomit).  You suddenly feel light-headed or dizzy.  Your heart starts to beat fast, or it feels like it is skipping beats. These symptoms may be an emergency. Do not wait to see if the symptoms will go away. Get medical help right away. Call your local emergency services (911 in the [place] Do not drive yourself to the hospital. This information is not intended to replace advice given to you by your health care provider. Make sure you discuss any questions you have with your health care provider. Document Released: 07/01/2007 Document Revised: 10/07/2015 Document Reviewed: 10/07/2015 Elsevier Interactive Patient Education  2017 [employer].

## 2017-04-30 NOTE — Progress Notes (Signed)
nb

## 2017-04-30 NOTE — Progress Notes (Signed)
Jasmine Long 67 y.o.   Chief Complaint  Patient presents with  . Follow-up    chest pain, patient was seen in [place] on 04/29/17 and was told she was normal    HISTORY OF PRESENT ILLNESS: This is a 67 y.o. female diabetic, hypertensive, chronic smoker, developed epigastric pain yesterday at around noon that radiated up into midsternal area with no associated symptoms.  Drove herself to the nearest emergency room where she was seen and evaluated.  States she had an EKG.  Told it was normal.  Had blood work and a chest x-ray.  Cardiac enzymes were negative x2.  Chest x-ray was normal.  Was given a GI cocktail with relief of symptoms.  Later released.  Went home and took some Prilosec.  Has a history of peptic ulcer disease and symptoms felt similar to ones she has had before due to the ulcer.  Was able to sleep.  Was told to follow-up with the PCP today.  No new symptoms.  Feels okay now.  Has no complaints.  She was [facility] yesterday at [facility] in [place].  HPI   Prior to Admission medications   Medication Sig Start Date End Date Taking? Authorizing Provider  ALPRAZolam Prudy Feeler) 0.5 MG tablet TAKE 1 TO 2 TABLETS BY MOUTH TWICE DAILY FOR ANXIETY 03/12/17  Yes Sherren Mocha, MD  buPROPion (WELLBUTRIN XL) 300 MG 24 hr tablet Take 1 tablet (300 mg total) by mouth daily. 03/12/17  Yes Sherren Mocha, MD  clonazePAM (KLONOPIN) 1 MG tablet TAKE 1 TABLET BY MOUTH AT BEDTIME FOR SLEEP OR ANXIETY 03/12/17  Yes Sherren Mocha, MD  levothyroxine (SYNTHROID, LEVOTHROID) 125 MCG tablet Take 1 tablet (125 mcg total) by mouth daily before breakfast. 03/12/17  Yes Sherren Mocha, MD  losartan (COZAAR) 100 MG tablet Take 1 tablet (100 mg total) by mouth daily. 03/12/17  Yes Sherren Mocha, MD  metFORMIN (GLUCOPHAGE) 1000 MG tablet Take 1 tablet (1,000 mg total) by mouth 2 (two) times daily with a meal. 03/12/17  Yes Sherren Mocha, MD    No Known Allergies  Patient Active Problem List   Diagnosis  Date Noted  . Hyperlipidemia 12/11/2015  . Insomnia 12/11/2015  . Panic attack as reaction to stress 12/11/2015  . Tobacco abuse 12/11/2015  . Chronic sinusitis 12/11/2015  . Diabetes (HCC) 05/15/2015  . Hypothyroid 04/29/2011  . BMI 40.0-44.9, adult (HCC) 04/29/2011  . Depression with anxiety 04/29/2011  . GERD (gastroesophageal reflux disease) 04/29/2011  . 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  . Nicotine addiction 04/29/2011  . Hypertension 04/29/2011  . Murmur, cardiac 04/29/2011    Past Medical History:  Diagnosis Date  . Allergy   . Anxiety   . Depression   . Heart murmur   . Thyroid disease   . Ulcer     Past Surgical History:  Procedure Laterality Date  . BREAST SURGERY    . CESAREAN SECTION    . CHOLECYSTECTOMY    . NASAL SEPTUM SURGERY      Social History   Socioeconomic History  . Marital status: Divorced    Spouse name: Not on file  . Number of children: Not on file  . Years of education: Not on file  . Highest education level: Not on file  Occupational History  . Occupation: Unemployed  Social Needs  . Financial resource strain: Not on file  . Food insecurity:    Worry: Not on file  Inability: Not on file  . Transportation needs:    Medical: Not on file    Non-medical: Not on file  Tobacco Use  . Smoking status: Current Every Day Smoker    Packs/day: 1.50    Years: 40.00    Pack years: 60.00    Types: Cigarettes    Last attempt to quit: 01/20/2012    Years since quitting: 5.2  . Smokeless tobacco: Never Used  . Tobacco comment: Pt doing E-cigarette as of 01/20/12  Substance and Sexual Activity  . Alcohol use: No  . Drug use: No  . Sexual activity: Never  Lifestyle  . Physical activity:    Days per week: Not on file    Minutes per session: Not on file  . Stress: Not on file  Relationships  . Social connections:    Talks on phone: Not on file    Gets together: Not on file    Attends religious service: Not on file    Active member of  club or organization: Not on file    Attends meetings of clubs or organizations: Not on file    Relationship status: Not on file  . Intimate partner violence:    Fear of current or ex partner: Not on file    Emotionally abused: Not on file    Physically abused: Not on file    Forced sexual activity: Not on file  Other Topics Concern  . Not on file  Social History Narrative   Patient on worker's compensation for an injury sustained on the job 5 years ago.    Family History  Problem Relation Age of Onset  . Lung cancer Mother   . Dementia Father   . Atrial fibrillation Father      Review of Systems  Constitutional: Negative.  Negative for chills and fever.  HENT: Negative.  Negative for congestion and nosebleeds.        Chronic sinus issues.  Eyes: Negative.   Respiratory: Positive for cough. Negative for shortness of breath and wheezing.   Cardiovascular: Positive for chest pain. Negative for palpitations.  Gastrointestinal: Positive for abdominal pain (Epigastric) and heartburn. Negative for blood in stool, melena, nausea and vomiting.  Genitourinary: Negative.  Negative for dysuria and hematuria.  Musculoskeletal: Negative for back pain, myalgias and neck pain.  Skin: Negative.  Negative for rash.  Neurological: Negative.  Negative for dizziness and headaches.  Endo/Heme/Allergies: Negative.   All other systems reviewed and are negative.  Vitals:   04/30/17 1037  BP: 132/76  Pulse: (!) 102  Resp: 17  Temp: 98.9 F (37.2 C)  SpO2: 98%     Physical Exam  Constitutional: She is oriented to person, place, and time. She appears well-developed and well-nourished.  HENT:  Head: Normocephalic and atraumatic.  Right Ear: External ear normal.  Left Ear: External ear normal.  Nose: Nose normal.  Mouth/Throat: Oropharynx is clear and moist.  Eyes: Pupils are equal, round, and reactive to light. Conjunctivae and EOM are normal.  Neck: Normal range of motion. Neck supple.  No thyromegaly present.  Cardiovascular: Normal rate and regular rhythm.  Murmur (Systolic 3/6) heard. Pulmonary/Chest: Effort normal and breath sounds normal. No respiratory distress.  Abdominal: Soft. There is tenderness (Mild epigastric). There is no rebound and no guarding.  Musculoskeletal: Normal range of motion.  Lymphadenopathy:    She has no cervical adenopathy.  Neurological: She is alert and oriented to person, place, and time. No sensory deficit. She exhibits normal muscle  tone.  Skin: Skin is warm and dry. Capillary refill takes less than 2 seconds. No rash noted.  Psychiatric: She has a normal mood and affect. Her behavior is normal.  Vitals reviewed.   EKG: This rhythm with ventricular rate of 94/min.  No acute ischemic changes.  Normal intervals. ASSESSMENT & PLAN: Dewayne Hatchnn was seen today for follow-up.  Diagnoses and all orders for this visit:  Nonspecific chest pain -     EKG 12-Lead -     Ambulatory referral to Cardiology  Epigastric pain  History of peptic ulcer  Type 2 diabetes mellitus without complication, without long-term current use of insulin (HCC)  Essential hypertension  Tobacco abuse    Patient Instructions       IF you received an x-ray today, you will receive an invoice from [facility]. Please contact [facility] at [phone] with questions or concerns regarding your invoice.   IF you received labwork today, you will receive an invoice from [facility]. Please contact [facility] at [phone] with questions or concerns regarding your invoice.   Our billing staff will not be able to assist you with questions regarding bills from these companies.  You will be contacted with the lab results as soon as they are available. The fastest way to get your results is to activate your My Chart account. Instructions are located on the last page of this paperwork. If you have not heard from us regarding the results in 2 weeks, please  contact this office.      Peptic Ulcer A peptic ulcer is a painful sore in the lining of your esophagus, stomach, or the first part of your small intestine. You may have pain in the area between your chest and your belly button. The most common causes of an ulcer are:  An infection.  Using certain pain medicines too often or too much.  Follow these instructions at home:  Avoid alcohol.  Avoid caffeine.  Do not use any tobacco products. These include cigarettes, chewing tobacco, and e-cigarettes. If you need help quitting, ask your doctor.  Take over-the-counter and prescription medicines only as told by your doctor. Do not stop or change your medicines unless you talk with your doctor about it first.  Keep all follow-up visits as told by your doctor. This is important. Contact a doctor if:  You do not get better in 7 days after you start treatment.  You keep having an upset stomach (indigestion) or heartburn. Get help right away if:  You have sudden, sharp pain in your belly (abdomen).  You have lasting belly pain.  You have bloody poop (stool) or black, tarry poop.  You throw up (vomit) blood. It may look like coffee grounds.  You feel light-headed or feel like you may pass out (faint).  You get weak.  You get sweaty or feel sticky and cold to the touch (clammy). This information is not intended to replace advice given to you by your health care provider. Make sure you discuss any questions you have with your health care provider. Document Released: 04/08/2009 Document Revised: 05/29/2015 Document Reviewed: 10/13/2014 Elsevier Interactive Patient Education  2018 Elsevier Inc.  Nonspecific Chest Pain Chest pain can be caused by many different conditions. There is a chance that your pain could be related to something serious, such as a heart attack or a blood clot in your lungs. Chest pain can also be caused by conditions that are not life-threatening. If you have  chest pain, it is very  important to follow up with your doctor. Follow these instructions at home: Medicines  If you were prescribed an antibiotic medicine, take it as told by your doctor. Do not stop taking the antibiotic even if you start to feel better.  Take over-the-counter and prescription medicines only as told by your doctor. Lifestyle  Do not use any products that contain nicotine or tobacco, such as cigarettes and e-cigarettes. If you need help quitting, ask your doctor.  Do not drink alcohol.  Make lifestyle changes as told by your doctor. These may include: ? Getting regular exercise. Ask your doctor for some activities that are safe for you. ? Eating a heart-healthy diet. A diet specialist (dietitian) can help you to learn healthy eating options. ? Staying at a healthy weight. ? Managing diabetes, if needed. ? Lowering your stress, as with deep breathing or spending time in nature. General instructions  Avoid any activities that make you feel chest pain.  If your chest pain is because of heartburn: ? Raise (elevate) the head of your bed about 6 inches (15 cm). You can do this by putting blocks under the bed legs at the head of the bed. ? Do not sleep with extra pillows under your head. That does not help heartburn.  Keep all follow-up visits as told by your doctor. This is important. This includes any further testing if your chest pain does not go away. Contact a doctor if:  Your chest pain does not go away.  You have a rash with blisters on your chest.  You have a fever.  You have chills. Get help right away if:  Your chest pain is worse.  You have a cough that gets worse, or you cough up blood.  You have very bad (severe) pain in your belly (abdomen).  You are very weak.  You pass out (faint).  You have either of these for no clear reason: ? Sudden chest discomfort. ? Sudden discomfort in your arms, back, neck, or jaw.  You have shortness of breath  at any time.  You suddenly start to sweat, or your skin gets clammy.  You feel sick to your stomach (nauseous).  You throw up (vomit).  You suddenly feel light-headed or dizzy.  Your heart starts to beat fast, or it feels like it is skipping beats. These symptoms may be an emergency. Do not wait to see if the symptoms will go away. Get medical help right away. Call your local emergency services (911 in the [place] Do not drive yourself to the hospital. This information is not intended to replace advice given to you by your health care provider. Make sure you discuss any questions you have with your health care provider. Document Released: 07/01/2007 Document Revised: 10/07/2015 Document Reviewed: 10/07/2015 Elsevier Interactive Patient Education  2017 Elsevier Inc.      Edwina Barth, MD [facility]

## 2017-05-03 ENCOUNTER — Telehealth: Payer: Self-pay | Admitting: Emergency Medicine

## 2017-05-03 NOTE — Telephone Encounter (Signed)
OK with this appointment. Thanks.

## 2017-05-03 NOTE — Telephone Encounter (Signed)
Pt has appt with [facility] for urgent referral on 05/10/17. Is this appt okay or do we need to try something sooner? Thanks!

## 2017-05-07 ENCOUNTER — Telehealth: Payer: Self-pay | Admitting: Cardiovascular Disease

## 2017-05-07 NOTE — Telephone Encounter (Signed)
Patient request : ordered [facility] chart for upcoming appointment on 05/10/17 @ 3:40pm with Dr. Tresa EndoKelly. Chart placed in Dr Landry DykeKelly's box. 05/07/17 ab

## 2017-05-09 NOTE — Telephone Encounter (Signed)
Order placed - sorry I had overlooked this request so hopefully our AMAZING referrals team can bale me out and get [facility] to [facility] on Mon 4/15???? THANK YOU.

## 2017-05-10 ENCOUNTER — Ambulatory Visit: Payer: Medicare HMO | Admitting: Cardiovascular Disease

## 2017-05-10 ENCOUNTER — Encounter: Payer: Self-pay | Admitting: Cardiovascular Disease

## 2017-05-10 VITALS — BP 154/77 | HR 87 | Ht 69.0 in | Wt 263.2 lb

## 2017-05-10 DIAGNOSIS — E118 Type 2 diabetes mellitus with unspecified complications: Secondary | ICD-10-CM | POA: Diagnosis not present

## 2017-05-10 DIAGNOSIS — Z6838 Body mass index (BMI) 38.0-38.9, adult: Secondary | ICD-10-CM

## 2017-05-10 DIAGNOSIS — E785 Hyperlipidemia, unspecified: Secondary | ICD-10-CM | POA: Diagnosis not present

## 2017-05-10 DIAGNOSIS — R011 Cardiac murmur, unspecified: Secondary | ICD-10-CM

## 2017-05-10 DIAGNOSIS — I10 Essential (primary) hypertension: Secondary | ICD-10-CM

## 2017-05-10 DIAGNOSIS — R079 Chest pain, unspecified: Secondary | ICD-10-CM | POA: Diagnosis not present

## 2017-05-10 DIAGNOSIS — Z79899 Other long term (current) drug therapy: Secondary | ICD-10-CM | POA: Diagnosis not present

## 2017-05-10 MED ORDER — METOPROLOL SUCCINATE ER 25 MG PO TB24: 25.0000 mg | ORAL_TABLET | Freq: Every day | ORAL | 3 refills | Status: DC

## 2017-05-10 NOTE — Progress Notes (Signed)
Cardiology Office Note    Date:  05/10/2017   ID:  Jasmine Long, DOB Sep 01, 1950, MRN [id number]  PCP:  Sherren Mocha, MD  Cardiologist:  Nicki Guadalajara, MD   Chief Complaint  Patient presents with  . New Patient (Initial Visit)   New cardiology evaluation referred through the courtesy of Dr. Edwina Barth in follow-up of a recent ER evaluation and chest discomfort.  History of Present Illness:  Jasmine Long is a 67 y.o. female who is a former patient of Dr. Alanda Amass.  She had previously seen Dr. Merla Riches for primary care .  He now sees Dr. [name].  She has a long-standing history of tobacco use and has been smoking for almost 40 years.  She has a history of diabetes mellitus, hypertension, and hypothyroidism.  In 2011, her son was murdered.  Since that time, she has had issues with posttraumatic stress disorder, hypertension, and feels that she is afraid to dream while sleeping because she has nightmares regarding her son's murder.  In 2010, she had undergone a nuclear stress test which did not show any ischemia and was felt to be low risk.  She states that she sleeps poorly.  She has frequent awakenings.  She recently presented to the emergency room when she was driving out of town and was seen at [facility].  At that time, she was concerned about chest pain was becoming anxious while driving.  Troponins were negative.  She left AGAINST MEDICAL ADVICE since they wanted her to stay overnight.  The following day she was seen by Dr. Edwina Barth who reviewed her emergency room evaluation.  Reportedly her blood work and chest x-ray were negative.  She was given a GI cocktail.  With release of symptoms.  She took Prilosec when she went home and she felt in retrospect that the symptoms that she had experienced related to indigestion.  She is now referred back to cardiology for reassessment.  Since she has not been seen by Dr. Alanda Amass since 2010.  Past Medical History:  Diagnosis Date    . Allergy   . Anxiety   . Depression   . Heart murmur   . Thyroid disease   . Ulcer     Past Surgical History:  Procedure Laterality Date  . BREAST SURGERY    . CESAREAN SECTION    . CHOLECYSTECTOMY    . NASAL SEPTUM SURGERY      Current Medications: Outpatient Medications Prior to Visit  Medication Sig Dispense Refill  . ALPRAZolam (XANAX) 0.5 MG tablet TAKE 1 TO 2 TABLETS BY MOUTH TWICE DAILY FOR ANXIETY 60 tablet 1  . buPROPion (WELLBUTRIN XL) 300 MG 24 hr tablet Take 1 tablet (300 mg total) by mouth daily. 90 tablet 0  . clonazePAM (KLONOPIN) 1 MG tablet TAKE 1 TABLET BY MOUTH AT BEDTIME FOR SLEEP OR ANXIETY 30 tablet 1  . levothyroxine (SYNTHROID, LEVOTHROID) 125 MCG tablet Take 1 tablet (125 mcg total) by mouth daily before breakfast. 90 tablet 0  . losartan (COZAAR) 100 MG tablet Take 1 tablet (100 mg total) by mouth daily. 90 tablet 0  . metFORMIN (GLUCOPHAGE) 1000 MG tablet Take 1 tablet (1,000 mg total) by mouth 2 (two) times daily with a meal. 180 tablet 0   No facility-administered medications prior to visit.      Allergies:   Patient has no known allergies.   Social History   Socioeconomic History  . Marital status: Divorced  Spouse name: Not on file  . Number of children: Not on file  . Years of education: Not on file  . Highest education level: Not on file  Occupational History  . Occupation: Unemployed  Social Needs  . Financial resource strain: Not on file  . Food insecurity:    Worry: Not on file    Inability: Not on file  . Transportation needs:    Medical: Not on file    Non-medical: Not on file  Tobacco Use  . Smoking status: Current Every Day Smoker    Packs/day: 1.50    Years: 40.00    Pack years: 60.00    Types: Cigarettes    Last attempt to quit: 01/20/2012    Years since quitting: 5.3  . Smokeless tobacco: Never Used  . Tobacco comment: Pt doing E-cigarette as of 01/20/12  Substance and Sexual Activity  . Alcohol use: No  .  Drug use: No  . Sexual activity: Never  Lifestyle  . Physical activity:    Days per week: Not on file    Minutes per session: Not on file  . Stress: Not on file  Relationships  . Social connections:    Talks on phone: Not on file    Gets together: Not on file    Attends religious service: Not on file    Active member of club or organization: Not on file    Attends meetings of clubs or organizations: Not on file    Relationship status: Not on file  Other Topics Concern  . Not on file  Social History Narrative   Patient on worker's compensation for an injury sustained on the job 5 years ago.     Family History:  The patient's family history includes Atrial fibrillation in her father; Dementia in her father; Lung cancer in her mother.   ROS General: Negative; No fevers, chills, or night sweats;  HEENT: Negative; No changes in vision or hearing, sinus congestion, difficulty swallowing Pulmonary: Negative; No cough, wheezing, shortness of breath, hemoptysis Cardiovascular: Negative; No chest pain, presyncope, syncope, palpitations GI: Positive for intermittent indigestion GU: Negative; No dysuria, hematuria, or difficulty voiding Musculoskeletal: Negative; no myalgias, joint pain, or weakness Hematologic/Oncology: Negative; no easy bruising, bleeding Endocrine: Positive for diabetes mellitus and hypothyroidism Neuro: Negative; no changes in balance, headaches Skin: Negative; No rashes or skin lesions Psychiatric: Positive for anxiety and depression Sleep: Positive for poor sleep, frequent awakenings.  She does not want REM sleep because she does not want to dream.  Other comprehensive 14 point system review is negative.   PHYSICAL EXAM:   VS:  BP (!) 154/77   Pulse 87   Ht 5\' 9"  (1.753 m)   Wt 263 lb 3.2 oz (119.4 kg)   BMI 38.87 kg/m     Repeat blood pressure was 146/78  Wt Readings from Last 3 Encounters:  05/10/17 263 lb 3.2 oz (119.4 kg)  04/30/17 263 lb (119.3 kg)    04/01/17 269 lb 6.4 oz (122.2 kg)    General: Alert, oriented, no distress.  Skin: normal turgor, no rashes, warm and dry HEENT: Normocephalic, atraumatic. Pupils equal round and reactive to light; sclera anicteric; extraocular muscles intact; Fundi arteriolar narrowing.  Disc flat. Nose without nasal septal hypertrophy Mouth/Parynx benign; Mallinpatti scale 3/4 Neck: No JVD, no carotid bruits; normal carotid upstroke Lungs: clear to ausculatation and percussion; no wheezing or rales Chest wall: without tenderness to palpitation Heart: PMI not displaced, RRR, s1 s2 normal, 1-2/6 systolic  murmur, no diastolic murmur, no rubs, gallops, thrills, or heaves Abdomen: Moderate central adiposity; soft, nontender; no hepatosplenomehaly, BS+; abdominal aorta nontender and not dilated by palpation. Back: no CVA tenderness Pulses 2+ Musculoskeletal: full range of motion, normal strength, no joint deformities Extremities: no clubbing cyanosis or edema, Homan's sign negative  Neurologic: grossly nonfocal; Cranial nerves grossly wnl Psychologic: Normal mood and affect   Studies/Labs Reviewed:   EKG:  EKG is ordered today. ECG (independently read by me): Normal sinus rhythm at 87 bpm.  No ectopy.  Normal intervals.  Recent Labs: BMP Latest Ref Rng & Units 01/30/2016 07/19/2015 10/30/2013  Glucose 65 - 99 mg/dL 409(W) 119(J) 478(G)  BUN 8 - 27 mg/dL 13 13 14   Creatinine 0.57 - 1.00 mg/dL 9.56 2.13 0.86  BUN/Creat Ratio 12 - 28 14 - -  Sodium 134 - 144 mmol/L 140 138 135  Potassium 3.5 - 5.2 mmol/L 4.5 3.7 4.5  Chloride 96 - 106 mmol/L 98 107 102  CO2 18 - 29 mmol/L 20 24 25   Calcium 8.7 - 10.3 mg/dL 9.9 9.2 9.5     Hepatic Function Latest Ref Rng & Units 01/30/2016 10/30/2013 08/07/2012  Total Protein 6.0 - 8.5 g/dL 7.4 7.2 7.4  Albumin 3.6 - 4.8 g/dL 4.6 4.4 3.5  AST 0 - 40 IU/L 14 14 12   ALT 0 - 32 IU/L 13 18 15   Alk Phosphatase 39 - 117 IU/L 103 98 107  Total Bilirubin 0.0 - 1.2 mg/dL 0.4 0.5  0.3    CBC Latest Ref Rng & Units 07/19/2015 10/30/2013 08/07/2012  WBC 4.0 - 10.5 K/uL 10.8(H) 8.6 17.9(H)  Hemoglobin 12.0 - 15.0 g/dL 57.8 46.9 62.9  Hematocrit 36.0 - 46.0 % 42.8 44.6 42.2  Platelets 150 - 400 K/uL 338 394 335   Lab Results  Component Value Date   MCV 89.5 07/19/2015   MCV 88.8 10/30/2013   MCV 89.4 08/07/2012   Lab Results  Component Value Date   TSH 1.420 01/30/2016   Lab Results  Component Value Date   HGBA1C 7.0 (H) 01/30/2016     BNP No results found for: BNP  ProBNP No results found for: PROBNP   Lipid Panel     Component Value Date/Time   CHOL 155 01/30/2016 1228   TRIG 83 01/30/2016 1228   HDL 48 01/30/2016 1228   CHOLHDL 3.2 01/30/2016 1228   CHOLHDL 3.2 05/15/2015 1716   VLDL 19 05/15/2015 1716   LDLCALC 90 01/30/2016 1228     RADIOLOGY: No results found.   Additional studies/ records that were reviewed today include:  I reviewed the [facility] from [facility].  I reviewed the [facility] emergency room records and the primary care evaluation.  ASSESSMENT:    1. Chest pain with moderate risk for cardiac etiology   2. Systolic murmur   3. Hyperlipidemia, unspecified hyperlipidemia type   4. Essential hypertension   5. Type 2 diabetes mellitus with complication, without long-term current use of insulin (HCC)   6. Medication management     PLAN:  Ms. Waldridge is a 67 year old female who has a history of obesity with a BMI of 38.9, hypertension, hypothyroidism, and long-standing tobacco use of at least 35-40 years.  I suspect she has sleep apnea but she refuses to consider this possibility.  Her sleep is poor and she has frequent awakenings and remains tired.  She did recently developed some indigestion-like chest discomfort which concerned her enough to prompt an emergency room  evaluation.  Troponins were negative.  Chest x-ray reportedly was within normal limits.  Her symptoms did improve with a GI cocktail  and subsequent Prilosec.  She has a cardiac murmur and I'm scheduling her for 2-D echo Doppler study to evaluate both systolic and diastolic function.  I have recommended that she undergo a CT coronary angiography to assess for coronary obstructive disease, particularly with her significant cardiac risk factor profile.  Remotely, she had undergone a nuclear stress test which revealed normal perfusion.  She does have significant anxiety and cannot go near [place] since his bring memories back of her son's murder.  We will see if we can arrange for her the have the CT angiogram not at [facility] of possible but otherwise we may need to do an in office nuclear stress test for assessment of her chest pain.  I am adding Toprol-XL 25 mg, which would be helpful both for blood pressure control and potential anti-ischemic benefit.  I discussed the importance of smoking cessation, but she has no interest to quit.  She is diabetic on metformin.  She has been eating a low carb diet.  I will see her for follow-up evaluation in 2 months.   Medication Adjustments/Labs and Tests Ordered: Current medicines are reviewed at length with the patient today.  Concerns regarding medicines are outlined above.  Medication changes, Labs and Tests ordered today are listed in the Patient Instructions below. Patient Instructions  Medication Instructions:  START metoprolol succinate (Toprol XL) 25 mg daily  Labwork: Please return for FASTING labs 1-2 weeks prior to CT (CMET, CBC, Lipid, TSH)  Our in office lab hours are Monday-Friday 8:00-4:00, closed for lunch 12:45-1:45 pm.  No appointment needed.  Testing/Procedures: Your physician has requested that you have an echocardiogram. Echocardiography is a painless test that uses sound waves to create images of your heart. It provides your doctor with information about the size and shape of your heart and how well your heart's chambers and valves are working. This procedure takes  approximately one hour. There are no restrictions for this procedure.  This will be done at our [facility] location:  [address]  Your physician has requested that you have cardiac CT. Cardiac computed tomography (CT) is a painless test that uses an x-ray machine to take clear, detailed pictures of your heart. For further information please visit [email or web address] Please follow instruction sheet as given.  Follow-Up: 2 months with Dr. Tresa Endo  Any Other Special Instructions Will Be Listed Below (If Applicable).     If you need a refill on your cardiac medications before your next appointment, please call your pharmacy.      Signed, Nicki Guadalajara, MD  05/10/2017 5:57 PM    [facility] [address], [place], [place]  [address] Phone: [phone]

## 2017-05-10 NOTE — Patient Instructions (Signed)
Medication Instructions:  START metoprolol succinate (Toprol XL) 25 mg daily  Labwork: Please return for FASTING labs 1-2 weeks prior to CT (CMET, CBC, Lipid, TSH)  Our in office lab hours are Monday-Friday 8:00-4:00, closed for lunch 12:45-1:45 pm.  No appointment needed.  Testing/Procedures: Your physician has requested that you have an echocardiogram. Echocardiography is a painless test that uses sound waves to create images of your heart. It provides your doctor with information about the size and shape of your heart and how well your heart's chambers and valves are working. This procedure takes approximately one hour. There are no restrictions for this procedure.  [facility]:  [address]  Your physician has requested that you have cardiac CT. Cardiac computed tomography (CT) is a painless test that uses an x-ray machine to take clear, detailed pictures of your heart. For further information please visit [email or web address]. Please follow instruction sheet as given.  Follow-Up: 2 months with Dr. Tresa EndoKelly  Any Other Special Instructions Will Be Listed Below (If Applicable).     If you need a refill on your cardiac medications before your next appointment, please call your pharmacy.

## 2017-05-12 ENCOUNTER — Telehealth: Payer: Self-pay | Admitting: *Deleted

## 2017-05-12 ENCOUNTER — Encounter: Payer: Self-pay | Admitting: Cardiovascular Disease

## 2017-05-12 NOTE — Telephone Encounter (Signed)
Left message to call back  

## 2017-05-17 NOTE — Telephone Encounter (Signed)
Spoke to patient, she states she does not want to have cardiac CT or stress test at this time.  She is agreeable to having echocardiogram [facility]) and will proceed with further testing after if needed.    She believes her pain is coming from a stomach ulcer as her pain is more in her stomach.   Prilosec and tums are relieving her pain.   Patient states she will call back to schedule follow up with Dr. Tresa EndoKelly as well.     Routed to MD to make aware.

## 2017-05-19 ENCOUNTER — Other Ambulatory Visit (HOSPITAL_COMMUNITY): Payer: Self-pay

## 2017-05-25 ENCOUNTER — Ambulatory Visit (HOSPITAL_COMMUNITY)
Admission: RE | Admit: 2017-05-25 | Discharge: 2017-05-25 | Disposition: A | Discharge status: Home / Self Care | Payer: Medicare HMO | Source: Ambulatory Visit | Attending: Cardiovascular Disease | Admitting: Cardiovascular Disease

## 2017-05-25 DIAGNOSIS — R011 Cardiac murmur, unspecified: Secondary | ICD-10-CM | POA: Diagnosis not present

## 2017-05-25 DIAGNOSIS — I352 Nonrheumatic aortic (valve) stenosis with insufficiency: Secondary | ICD-10-CM | POA: Diagnosis not present

## 2017-05-25 NOTE — Progress Notes (Signed)
  Echocardiogram 2D Echocardiogram has been performed.  Celene Skeen 05/25/2017, 11:56 AM

## 2017-05-26 ENCOUNTER — Telehealth: Payer: Self-pay | Admitting: Cardiovascular Disease

## 2017-05-26 NOTE — Telephone Encounter (Signed)
Returned the call to the patient. She stated that she was calling for the results of the echo. She has been informed that once the results have been read that the nurse will call her. She has verbalized her understanding.  She stated that [facility] had called to tell her that the cardiac ct had been approved. It had been noted in epic that she declined to do this test at the moment. The patient has been made aware that this was noted.

## 2017-05-26 NOTE — Telephone Encounter (Signed)
Pt calling and wanting to know why her ins company called and told her she was auth to have a Cardiac CT. Pt stated she didn't want to have a CT and she had an Echocardiogram. I advised pt that the precert was probably already submitted before she stated she didn't want to have it done. Pt also wanted to know the results of her Echo from yesterday, I advise pt it could take 72hr and pt stated the tech said she could get it today. Please advise pt.

## 2017-05-27 ENCOUNTER — Ambulatory Visit: Payer: Self-pay | Admitting: Family Medicine

## 2017-06-03 ENCOUNTER — Other Ambulatory Visit: Payer: Self-pay | Admitting: Family Medicine

## 2017-06-04 ENCOUNTER — Telehealth: Payer: Self-pay | Admitting: Family Medicine

## 2017-06-04 MED ORDER — LEVOTHYROXINE SODIUM 125 MCG PO TABS: 125.0000 ug | ORAL_TABLET | Freq: Every day | ORAL | 0 refills | Status: DC

## 2017-06-04 MED ORDER — BUPROPION HCL ER (XL) 300 MG PO TB24: 300.0000 mg | ORAL_TABLET | Freq: Every day | ORAL | 0 refills | Status: DC

## 2017-06-04 NOTE — Telephone Encounter (Signed)
Copied from CRM [id number]. Topic: Quick Communication - Rx Refill/Question >> Jun 04, 2017 11:12 AM Percival Spanish wrote: Medication   buPROPion (WELLBUTRIN XL) 300 MG 24 hr tablet                  levothyroxine (SYNTHROID, LEVOTHROID) 125 MCG tablet   losartan (COZAAR) 100 MG tablet    Has the patient contacted their pharmacy yes   Preferred Pharmacy  [facility]  Agent: Please be advised that RX refills may take up to 3 business days. We ask that you follow-up with your pharmacy.

## 2017-06-25 ENCOUNTER — Encounter

## 2017-06-28 ENCOUNTER — Encounter: Payer: Self-pay | Admitting: Family Medicine

## 2017-06-28 ENCOUNTER — Ambulatory Visit (INDEPENDENT_AMBULATORY_CARE_PROVIDER_SITE_OTHER): Payer: Medicare HMO | Admitting: Family Medicine

## 2017-06-28 ENCOUNTER — Other Ambulatory Visit: Payer: Self-pay

## 2017-06-28 VITALS — BP 126/66 | HR 96 | Temp 98.3°F | Resp 16 | Ht 68.0 in | Wt 265.8 lb

## 2017-06-28 DIAGNOSIS — N899 Noninflammatory disorder of vagina, unspecified: Secondary | ICD-10-CM | POA: Diagnosis not present

## 2017-06-28 DIAGNOSIS — Z113 Encounter for screening for infections with a predominantly sexual mode of transmission: Secondary | ICD-10-CM

## 2017-06-28 DIAGNOSIS — Z124 Encounter for screening for malignant neoplasm of cervix: Secondary | ICD-10-CM

## 2017-06-28 DIAGNOSIS — E782 Mixed hyperlipidemia: Secondary | ICD-10-CM

## 2017-06-28 DIAGNOSIS — F17210 Nicotine dependence, cigarettes, uncomplicated: Secondary | ICD-10-CM | POA: Diagnosis not present

## 2017-06-28 DIAGNOSIS — I151 Hypertension secondary to other renal disorders: Secondary | ICD-10-CM

## 2017-06-28 DIAGNOSIS — E032 Hypothyroidism due to medicaments and other exogenous substances: Secondary | ICD-10-CM

## 2017-06-28 DIAGNOSIS — N2889 Other specified disorders of kidney and ureter: Secondary | ICD-10-CM | POA: Diagnosis not present

## 2017-06-28 DIAGNOSIS — E119 Type 2 diabetes mellitus without complications: Secondary | ICD-10-CM | POA: Diagnosis not present

## 2017-06-28 DIAGNOSIS — N898 Other specified noninflammatory disorders of vagina: Secondary | ICD-10-CM

## 2017-06-28 DIAGNOSIS — R011 Cardiac murmur, unspecified: Secondary | ICD-10-CM | POA: Diagnosis not present

## 2017-06-28 DIAGNOSIS — N907 Vulvar cyst: Secondary | ICD-10-CM

## 2017-06-28 LAB — POCT WET + KOH PREP
TRICH BY WET PREP: ABSENT
Yeast by KOH: ABSENT
Yeast by wet prep: ABSENT

## 2017-06-28 LAB — POCT URINALYSIS DIP (MANUAL ENTRY)
BILIRUBIN UA: NEGATIVE
BILIRUBIN UA: NEGATIVE mg/dL
GLUCOSE UA: NEGATIVE mg/dL
Leukocytes, UA: NEGATIVE
Nitrite, UA: NEGATIVE
Protein Ur, POC: NEGATIVE mg/dL
RBC UA: NEGATIVE
Urobilinogen, UA: 0.2 E.U./dL
pH, UA: 6 (ref 5.0–8.0)

## 2017-06-28 NOTE — Patient Instructions (Addendum)
IF you received an x-ray today, you will receive an invoice from [facility]. Please contact [facility] at [phone] with questions or concerns regarding your invoice.   IF you received labwork today, you will receive an invoice from [place]. Please contact [facility] at [phone] with questions or concerns regarding your invoice.   Our billing staff will not be able to assist you with questions regarding bills from these companies.  You will be contacted with the lab results as soon as they are available. The fastest way to get your results is to activate your My Chart account. Instructions are located on the last page of this paperwork. If you have not heard from [place] regarding the results in 2 weeks, please contact this office.     Epidermal Cyst An epidermal cyst is sometimes called an epidermal inclusion cyst or an infundibular cyst. It is a sac made of skin tissue. The sac contains a substance called keratin. Keratin is a protein that is normally secreted through the hair follicles. When keratin becomes trapped in the top layer of skin (epidermis), it can form an epidermal cyst. Epidermal cysts are usually found on the face, neck, trunk, and genitals. These cysts are usually harmless (benign), and they may not cause symptoms unless they become infected. It is important not to pop epidermal cysts yourself. What are the causes? This condition may be caused by:  A blocked hair follicle.  A hair that curls and re-enters the skin instead of growing straight out of the skin (ingrown hair).  A blocked pore.  Irritated skin.  An injury to the skin.  Certain conditions that are passed along from parent to child (inherited).  Human papillomavirus (HPV).  What increases the risk? The following factors may make you more likely to develop an epidermal cyst:  Having acne.  Being overweight.  Wearing tight clothing.  What are the signs or symptoms? The  only symptom of this condition may be a small, painless lump underneath the skin. When an epidermal cyst becomes infected, symptoms may include:  Redness.  Inflammation.  Tenderness.  Warmth.  Fever.  Keratin draining from the cyst. Keratin may look like a grayish-white, bad-smelling substance.  Pus draining from the cyst.  How is this diagnosed? This condition is diagnosed with a physical exam. In some cases, you may have a sample of tissue (biopsy) taken from your cyst to be examined under a microscope or tested for bacteria. You may be referred to a health care provider who specializes in skin care (dermatologist). How is this treated? In many cases, epidermal cysts go away on their own without treatment. If a cyst becomes infected, treatment may include:  Opening and draining the cyst. After draining, minor surgery to remove the rest of the cyst may be done.  Antibiotic medicine to help prevent infection.  Injections of medicines (steroids) that help to reduce inflammation.  Surgery to remove the cyst. Surgery may be done if: ? The cyst becomes large. ? The cyst bothers you. ? There is a chance that the cyst could turn into cancer.  Follow these instructions at home:  Take over-the-counter and prescription medicines only as told by your health care provider.  If you were prescribed an antibiotic, use it as told by your health care provider. Do not stop using the antibiotic even if you start to feel better.  Keep the area around your cyst clean and dry.  Wear loose, dry clothing.  Do not try  to pop your cyst.  Avoid touching your cyst.  Check your cyst every day for signs of infection.  Keep all follow-up visits as told by your health care provider. This is important. How is this prevented?  Wear clean, dry, clothing.  Avoid wearing tight clothing.  Keep your skin clean and dry. Shower or take baths every day.  Wash your body with a benzoyl peroxide wash  when you shower or bathe. Contact a health care provider if:  Your cyst develops symptoms of infection.  Your condition is not improving or is getting worse.  You develop a cyst that looks different from other cysts you have had.  You have a fever. Get help right away if:  Redness spreads from the cyst into the surrounding area. This information is not intended to replace advice given to you by your health care provider. Make sure you discuss any questions you have with your health care provider. Document Released: 12/14/2003 Document Revised: 09/11/2015 Document Reviewed: 11/14/2014 Elsevier Interactive Patient Education  2018 [employer].  Diabetes Mellitus and Nutrition When you have diabetes (diabetes mellitus), it is very important to have healthy eating habits because your blood sugar (glucose) levels are greatly affected by what you eat and drink. Eating healthy foods in the appropriate amounts, at about the same times every day, can help you:  Control your blood glucose.  Lower your risk of heart disease.  Improve your blood pressure.  Reach or maintain a healthy weight.  Every person with diabetes is different, and each person has different needs for a meal plan. Your health care provider may recommend that you work with a diet and nutrition specialist (dietitian) to make a meal plan that is best for you. Your meal plan may vary depending on factors such as:  The calories you need.  The medicines you take.  Your weight.  Your blood glucose, blood pressure, and cholesterol levels.  Your activity level.  Other health conditions you have, such as heart or kidney disease.  How do carbohydrates affect me? Carbohydrates affect your blood glucose level more than any other type of food. Eating carbohydrates naturally increases the amount of glucose in your blood. Carbohydrate counting is a method for keeping track of how many carbohydrates you eat. Counting carbohydrates  is important to keep your blood glucose at a healthy level, especially if you use insulin or take certain oral diabetes medicines. It is important to know how many carbohydrates you can safely have in each meal. This is different for every person. Your dietitian can help you calculate how many carbohydrates you should have at each meal and for snack. Foods that contain carbohydrates include:  Bread, cereal, rice, pasta, and crackers.  Potatoes and corn.  Peas, beans, and lentils.  Milk and yogurt.  Fruit and juice.  Desserts, such as cakes, cookies, ice cream, and candy.  How does alcohol affect me? Alcohol can cause a sudden decrease in blood glucose (hypoglycemia), especially if you use insulin or take certain oral diabetes medicines. Hypoglycemia can be a life-threatening condition. Symptoms of hypoglycemia (sleepiness, dizziness, and confusion) are similar to symptoms of having too much alcohol. If your health care provider says that alcohol is safe for you, follow these guidelines:  Limit alcohol intake to no more than 1 drink per day for nonpregnant women and 2 drinks per day for men. One drink equals 12 oz of beer, 5 oz of wine, or 1 oz of hard liquor.  Do not drink  on an empty stomach.  Keep yourself hydrated with water, diet soda, or unsweetened iced tea.  Keep in mind that regular soda, juice, and other mixers may contain a lot of sugar and must be counted as carbohydrates.  What are tips for following this plan? Reading food labels  Start by checking the serving size on the label. The amount of calories, carbohydrates, fats, and other nutrients listed on the label are based on one serving of the food. Many foods contain more than one serving per package.  Check the total grams (g) of carbohydrates in one serving. You can calculate the number of servings of carbohydrates in one serving by dividing the total carbohydrates by 15. For example, if a food has 30 g of total  carbohydrates, it would be equal to 2 servings of carbohydrates.  Check the number of grams (g) of saturated and trans fats in one serving. Choose foods that have low or no amount of these fats.  Check the number of milligrams (mg) of sodium in one serving. Most people should limit total sodium intake to less than 2,300 mg per day.  Always check the nutrition information of foods labeled as "low-fat" or "nonfat". These foods may be higher in added sugar or refined carbohydrates and should be avoided.  Talk to your dietitian to identify your daily goals for nutrients listed on the label. Shopping  Avoid buying canned, premade, or processed foods. These foods tend to be high in fat, sodium, and added sugar.  Shop around the outside edge of the grocery store. This includes fresh fruits and vegetables, bulk grains, fresh meats, and fresh dairy. Cooking  Use low-heat cooking methods, such as baking, instead of high-heat cooking methods like deep frying.  Cook using healthy oils, such as olive, canola, or sunflower oil.  Avoid cooking with butter, cream, or high-fat meats. Meal planning  Eat meals and snacks regularly, preferably at the same times every day. Avoid going long periods of time without eating.  Eat foods high in fiber, such as fresh fruits, vegetables, beans, and whole grains. Talk to your dietitian about how many servings of carbohydrates you can eat at each meal.  Eat 4-6 ounces of lean protein each day, such as lean meat, chicken, fish, eggs, or tofu. 1 ounce is equal to 1 ounce of meat, chicken, or fish, 1 egg, or 1/4 cup of tofu.  Eat some foods each day that contain healthy fats, such as avocado, nuts, seeds, and fish. Lifestyle   Check your blood glucose regularly.  Exercise at least 30 minutes 5 or more days each week, or as told by your health care provider.  Take medicines as told by your health care provider.  Do not use any products that contain nicotine or  tobacco, such as cigarettes and e-cigarettes. If you need help quitting, ask your health care provider.  Work with a Veterinary surgeon or diabetes educator to identify strategies to manage stress and any emotional and social challenges. What are some questions to ask my health care provider?  Do I need to meet with a diabetes educator?  Do I need to meet with a dietitian?  What number can I call if I have questions?  When are the best times to check my blood glucose? Where to find more information:  American Diabetes Association: diabetes.org/food-and-fitness/food  Academy of Nutrition and Dietetics: https://www.vargas.com/  [employer] of Diabetes and Digestive and Kidney Diseases (NIH): FindJewelers.cz Summary  A healthy meal plan will help you  control your blood glucose and maintain a healthy lifestyle.  Working with a diet and nutrition specialist (dietitian) can help you make a meal plan that is best for you.  Keep in mind that carbohydrates and alcohol have immediate effects on your blood glucose levels. It is important to count carbohydrates and to use alcohol carefully. This information is not intended to replace advice given to you by your health care provider. Make sure you discuss any questions you have with your health care provider. Document Released: 10/09/2004 Document Revised: 02/17/2016 Document Reviewed: 02/17/2016 Elsevier Interactive Patient Education  2018 [employer].   Tips for Eating Away From Home If You Have Diabetes Controlling your level of blood glucose, also known as blood sugar, can be challenging. It can be even more difficult when you do not prepare your own meals. The following tips can help you manage your diabetes when you eat away from home. Planning ahead Plan ahead if you know you will be eating away from home:  Ask your health  care provider how to time meals and medicine if you are taking insulin.  Make a list of restaurants near you that offer healthy choices. If they have a carry-out menu, take it home and plan what you will order ahead of time.  Look up the restaurant you want to eat at online. Many chain and fast-food restaurants list nutritional information online. Use this information to choose the healthiest options and to calculate how many carbohydrates will be in your meal.  Use a carbohydrate-counting book or mobile app to look up the carbohydrate content and serving size of the foods you want to eat.  Become familiar with serving sizes and learn to recognize how many servings are in a portion. This will allow you to estimate how many carbohydrates you can eat.  Free foods A "free food" is any food or drink that has less than 5 g of carbohydrates per serving. Free foods include:  Many vegetables.  Hard boiled eggs.  Nuts or seeds.  Olives.  Cheeses.  Meats.  These types of foods make good appetizer choices and are often available at salad bars. Lemon juice, vinegar, or a low-calorie salad dressing of fewer than 20 calories per serving can be used as a "free" salad dressing. Choices to reduce carbohydrates  Substitute nonfat sweetened yogurt with a sugar-free yogurt. Yogurt made from soy milk may also be used, but you will still want a sugar-free or plain option to choose a lower carbohydrate amount.  Ask your server to take away the bread basket or chips from your table.  Order fresh fruit. A salad bar often offers fresh fruit choices. Avoid canned fruit because it is usually packed in sugar or syrup.  Order a salad, and eat it without dressing. Or, create a "free" salad dressing.  Ask for substitutions. For example, instead of [place] fries, request an order of a vegetable such as salad, green beans, or broccoli. Other tips  If you take insulin, take the insulin once your food arrives to  your table. This will ensure your insulin and food are timed correctly.  Ask your server about the portion size before your order, and ask for a take-out box if the portion has more servings than you should have. When your food comes, leave the amount you should have on the plate, and put the rest in the take-out box.  Consider splitting an entree with someone and ordering a side salad. This information is not intended  to replace advice given to you by your health care provider. Make sure you discuss any questions you have with your health care provider. Document Released: 01/12/2005 Document Revised: 06/20/2015 Document Reviewed: 04/11/2013 Elsevier Interactive Patient Education  2018 [employer].  [employer] Loss Program    Restaurants  Eating out at restaurants has become a way of life for most of [place].  On an average basis, Americans eat more than 25% of their meals away from home.  When eating a meal at a restaurant, it is important to plan and think ahead about what healthy food choices are available. In addition, it is important to think that every meal out is a special occasion and to practice portion control.  Most restaurants serve portions that are 2-3 times bigger than what is considered normal.  There are several ways to control these portion sizes:  Share the meal with another person, have half of the meal bagged "to go" before eating, or eat half and leave the rest behind.  If the bread basket is your weakness, ask that it not be served.  Portion sizes also pertain to drinks. Water, diet soda, and unsweetened iced tea are the best calorie free beverages available. Multiple refills of regular soda and alcoholic beverages greatly increase total calorie intake.   The following items offer smart, low calore choice when eating at a restaurant as well as the appropriate serving sizes:  Fast Food: Fried chicken sandwich with lettuce and tomato, mayonnaise, cheese and [place] fries should be  avoided.  Many salads at fast food restaurants have more calories and fat than a hamburger.  Make sure salad has grilled meat only with no cheese  Or nuts.  Fat free or light dressings should always be used.  [place] Food: 1 cup egg drop soup or 1 cup of Chinese vegetables with shrimp or tofin.  Avoid all fried food, including fried rice.  One half to 1 cup steamed white rice is also acceptable.   [place] Food: 1 small bean burrito or 2 chicken fajitas without cheese, sour cream and guacamole.  Fat free or light sour cream is allowed.  [place] Food: Spaghetti with [employer] (1 cup spaghetti, 1/2 cup sauce).  Cram sauces (alfredo) and fried foods (chicken, eggplant, veal parmesan) should be avoided.  [place] Food: Sushi and teriyaki fish or chicken are healthy options served with steamed vegetables.  Brunch Buffet: 1 cup fruit salad, 1 cup oatmeal, 2 pancakes with light syrup, 1/2 cup scrambled eggs, toast with jelly.  Biscuits and gravy, bacon and sausage should be avoided.  [place] bacon is acceptable.  Commitment- Commitment is a critical part of any successful project and is the difference between failure and success.  It involves focusing on your goal seriously and using will power to get through the rough times.  This allows you to discover inner strengths and develop a confident and an in control new you.   Dieting is not an easy task and there are bound to be rough times.  However, if your commitment to yourself and your weight loss is strong, you will be successful in achieving your goal.  Don't break your promise to yourself.    Diet Modification:  The main components of a healthy diet include 3 small meals with 3 healthy snakes throughout each day.  Your should not go more than 4 hours without eating a small snack or meal.  This helps avoid "starvation" and decreased the urge to choose unhealthy foods. The following  guidelines should be followed through the day:  Focus on portion  size and control   It is important to read nutrition labels so that you are aware of the appropriate serving size  For those foods that do not have a nutrition label, you can generally use your had as a guide for the appropriate servings sizes.  Fist 1 cup or medium whole fruit  Thumb (tip to base) 1 ounce of meat or chees  Thumb tip (tip to 1st joint) 1 tablespoon  Fingertip (tip to 1st joint) 1 teaspoon  Cupped hand 1-2 ounces of nuts, pretzels, popcorn  Palm (minus fingers) 3 ounces of meat, fish or poultry    Eat breakfast daily to increase metabolism and prevent loss of control later in the day.  Research has repeatedly shown that patients who eat breakfast daily lose more weight and keep the weight off more effectively than patients who skip breakfast.  One serving of high fiber cereal (> 3 grams of fiber) with 1 cup of skim milk.  One serving of old fashioned oatmeal ( not instant) made with skim milk.  May add 1/2 cup chopped fruit an sugar substitute for added flavor.  Two egg omelet made with egg beaters. May add 1 tablespoon reduced fat cheese and unlimited vegetables.  One slice whole grain toast with 1 teaspoon Smart Beat margarine.  Avoid fruit juice since it has too many sugars and too little fiber. Coffee and tea fine with sugar substitute and skim or fat free dairy creamer.  Mid morning snack-choosing one of the following will help keep the craving under control.  One piece of fruit-apple, pear, banana, 1 cup of grapes  One half cup 2% cottage cheese  One container of fat free yogurt  One low fat mozzarella cheese stick  1 teaspoon natural peanut butter on celery sticks  1-2 cups air popped popcorn sprayed no more than twice with fat free margarine spray (May only have one per day)    Lunch  Start with an all vegetable salad ( no cheese or croutons with 1 teaspoon fat free or light dressing salad dressing.  Many patients find it convenient to eat a  prepared meal such as [employer], [employer], [employer], [employer].  It is important to alternate these prepared meals with homemade meals due to their high salt content.  See dinner options for homemade meal ideas.  May add 1/2 cup rice to meal if prepared meal is not adequate  Piece of fruit.  Mid Afternoon Snack-Same as mid morning snack  Dinner  May pick one serving from each of the following categories (derived from [employer]'s best selling book, :"Body for Live").  Start with an all vegetable salad (no cheese or croutons) with 1 teaspoon of fat free or light salad dressing or 1 cup low fat broth based soup Good Proteins (Palm-size portion 4oz) Good Carbohydrates (Fist-sized portion) Good Vegetables  Baked or grilled chicken breast Baked Potato Broccoli  [place] Breast Sweet Potato Asparagus  Lean ground [place] Yam Lettuce  Fish (baked, broiled, grilled, blackened allowed) [employer] ( 1/2-1 cup) Carrots  Tuna (Fresh or canned in water) Wild rice (1/2-1 cup) Cauliflower  Crab Pasta ( 1 cup whole grain) Green Beans  Shrimp Oatmeal Green Peppers  Top round steak Beans(red, pinto, black) Mushrooms   Top sirloin steak Corn Spiniach  Lean ground beef Strawberries Tomato  Low-fat cottage cheese Whole wheat bread (1 slice regular 2 slice light) Artichoke  Cabbage    Zucchini    Cucumber    Onion    Prepare these foods using low-fat techniques such as grilling, baking, broiling, blackening meats and steaming vegetables.  Avoid canned vegetables and fruits to decrease sodium intake.  Fresh and froze produce offer the most nutritional value  Evening Snack or "Dessert"  1/2 cup skim milk with 1 package of No sugar added hot cocoa mix  1 serving of Weight Watchers frozen desserts, Fudgesicle bar, Skinny cow ice cream treats  1 cup Sugar Free Jell-O or instant pudding topped with 1 teaspoon fat free whipped topping    1-2 cups air popped popcorn sprayed no more  than twice with Fat Free Margarine Spray (may only have once per day)  Water  Eight glasses of water daily (lemon, sugar substitute, and crystal light accepted)  All regular sodas and sweetened tea are NOT allowed.  This results in taking in too many empty calories.  Two diet sodas per day are allowed as are an unlimited amount of decaffeinated coffee and tea as long as sugar substitute (Splenda, Sweet and Low, Equal) only are used for sweetening.  Water naturally suppresses the appetite and helps the body metabolize stored fat  Drinking enough water is the best treatment for fluid retention (unless you have been diagnosed with kidney deficiency).  When the body gets less water, it senses it as a threat and holds on to the water.  It is stored in places such as the feet, legs and hands.  By drinking plenty of water, the problem of water, the problem of water retention is solved and weight loss occurs.  The above diet modifications are based on the following principles:  25-35 grams of fiber daily (found in vegetables, whole grain, oats, beans and fruits)  Choose healthy carbohydrates that are rich in fiber, vitamins, and minerals (wheat bread vs white bread, wheat pasta vs regular pasta)  Healthy fats: Choose canola or olive oil if you must use oil to cook with.  Pam cooking spray is a good substitute.  Smart Choice margarine is a healthy alternative to other margarine and butter options.  Baked goods and fried foods should be avoided to decrease calorie intake harmful trans fatty acids and saturated fat absorb in the body.  Healthy Proteins: Americans consume far too much red meat (high amount of saturated fat). Healthier sources of protein are baked or broiled fish,poultry (without skin), beans, low-fat cottage cheese, egg whites and egg substitutes.  If you choose red meat, pick lean cuts such as top round, to sirloin or round,  The following will be implemented to help assist you in  achieving successful weight loss on a weekly basis:  Food Diary: Keeping a record of all food you have eaten on a daily basis helps to make you aware of your food choices and helps determine the amount you have eaten.  It also serves as a great reference to look back to see what you did well on during the weeks you were most successful.  Weekly Meal Plan: Plan your meals and go grocery shopping on a weekly basis.  Those patients that plan their meals are far more successful on reaching their goals than other patients.  Without a plan, it is easy to fall back into old eating habits.  Weekly weigh in: Patients who report weekly for weight checks do better than those that try to diet without help.  Knowing your weight will be checked weekly helps keep  you on track as well as staying motivated by the encouragement received on a weekly basis.    Exercise  You will need to begin exercising at least 5 days a week.  If you are just beginning an exercise program, start slowly at 15-20 minutes and gradually work up to a longer period of time.  If you are tolerating the exercise well you can increase by 5 minutes each day.  You may use any form of exercise you wish (walking, aerobics, treadmill, stair stepper, elliptical machine).  If you can walk two miles in 30-40 minutes,  You are at a good pace for a successful start.   Do not over do it!!  Make sure you can carry on a conversation with someone or able to whistle while you exercise.  Once you adjust your pace and are breathing normally, step up your pace as you can tolerate it.   The following facilities in [place] area offer gym memberships:  [employer] gym: Only $9.00 a month  [employer]: [phone]  [employer] [facility]: [phone] ($29.00 per month)  [employer] [phone]

## 2017-06-28 NOTE — Progress Notes (Signed)
Subjective:  By signing my name below, I, Jasmine Long, attest that this documentation has been prepared under the direction and in the presence of [name], MD Electronically Signed: Charline Bills, ED Scribe 06/28/2017 at 10:28 AM.   Patient ID: Jasmine Long, female    DOB: 09/10/1950, 67 y.o.   MRN: [id number]  Chief Complaint  Patient presents with  . Synthroid check  . Vaginal lump    right labia, x 2 wks ago   HPI Jasmine Long is a 67 y.o. female who presents to Primary Care at [facility]. Pt was seen 4 months ago for f/u on TIIDM. Due for labs but was getting medicare in several wks so we agreed to defer as she was self-pay. Agreed to return in 2-3 months for labs. However in the interim she was seen for gastroenteritis by partner. She was then seen in ER 2 month prior for cp radiating down L arm. Seen the following day in clinic for f/u and referred to cardiology. Seen by Dr. Tresa Endo 6 wks ago who ordered a variety of labs which haven't been drawn. Suspected sleep apnea which she refused to consider. Cp attributed to GERD. Recommended to have 2D echo due to cardiac murmur and CT coronary angio as she has numerous cardiac risk factors. Started Toprol XL 25. No interest in smoking cessation. F/u in 2 months.  Today, pt presents with a non-tender vaginal lump to the R labia first noticed 2 wks ago.  Pt also reports nausea due to shrimp she ate last night that she brought back with her from the beach. Denies diarrhea, vomiting. Pt declines meds.  Past Medical History:  Diagnosis Date  . Allergy   . Anxiety   . Depression   . Heart murmur   . Thyroid disease   . Ulcer    Current Outpatient Medications on File Prior to Visit  Medication Sig Dispense Refill  . ALPRAZolam (XANAX) 0.5 MG tablet TAKE 1 TO 2 TABLETS BY MOUTH TWICE DAILY FOR ANXIETY 60 tablet 1  . buPROPion (WELLBUTRIN XL) 300 MG 24 hr tablet Take 1 tablet (300 mg total) by mouth daily. 90 tablet 0  . clonazePAM (KLONOPIN) 1 MG  tablet TAKE 1 TABLET BY MOUTH AT BEDTIME FOR SLEEP OR ANXIETY 30 tablet 1  . levothyroxine (SYNTHROID, LEVOTHROID) 125 MCG tablet Take 1 tablet (125 mcg total) by mouth daily before breakfast. 90 tablet 0  . losartan (COZAAR) 100 MG tablet TAKE 1 TABLET (100 MG TOTAL) BY MOUTH DAILY. 90 tablet 0  . metFORMIN (GLUCOPHAGE) 1000 MG tablet Take 1 tablet (1,000 mg total) by mouth 2 (two) times daily with a meal. 180 tablet 0  . metoprolol succinate (TOPROL XL) 25 MG 24 hr tablet Take 1 tablet (25 mg total) by mouth daily. 90 tablet 3   No current facility-administered medications on file prior to visit.    Past Surgical History:  Procedure Laterality Date  . BREAST SURGERY    . CESAREAN SECTION    . CHOLECYSTECTOMY    . NASAL SEPTUM SURGERY     No Known Allergies Family History  Problem Relation Age of Onset  . Lung cancer Mother   . Dementia Father   . Atrial fibrillation Father    Social History   Socioeconomic History  . Marital status: Divorced    Spouse name: Not on file  . Number of children: Not on file  . Years of education: Not on file  . Highest education  level: Not on file  Occupational History  . Occupation: Unemployed  Social Needs  . Financial resource strain: Not on file  . Food insecurity:    Worry: Not on file    Inability: Not on file  . Transportation needs:    Medical: Not on file    Non-medical: Not on file  Tobacco Use  . Smoking status: Current Every Day Smoker    Packs/day: 1.50    Years: 40.00    Pack years: 60.00    Types: Cigarettes    Last attempt to quit: 01/20/2012    Years since quitting: 5.6  . Smokeless tobacco: Never Used  . Tobacco comment: Pt doing E-cigarette as of 01/20/12  Substance and Sexual Activity  . Alcohol use: No  . Drug use: No  . Sexual activity: Never  Lifestyle  . Physical activity:    Days per week: Not on file    Minutes per session: Not on file  . Stress: Not on file  Relationships  . Social connections:     Talks on phone: Not on file    Gets together: Not on file    Attends religious service: Not on file    Active member of club or organization: Not on file    Attends meetings of clubs or organizations: Not on file    Relationship status: Not on file  Other Topics Concern  . Not on file  Social History Narrative   Patient on worker's compensation for an injury sustained on the job 5 years ago.   Depression screen [facility] 2/9 06/28/2017 04/30/2017 04/01/2017 03/12/2017 01/30/2016  Decreased Interest 0 0 0 0 1  Down, Depressed, Hopeless 0 0 0 0 2  PHQ - 2 Score 0 0 0 0 3  Altered sleeping - - - - 3  Tired, decreased energy - - - - 0  Change in appetite - - - - 2  Feeling bad or failure about yourself  - - - - 0  Trouble concentrating - - - - 0  Moving slowly or fidgety/restless - - - - 0  Suicidal thoughts - - - - 0  PHQ-9 Score - - - - 8  Difficult doing work/chores - - - - Not difficult at all     Review of Systems  Gastrointestinal: Positive for nausea. Negative for diarrhea and vomiting.  Genitourinary:       + Vaginal lump      Objective:   Physical Exam  Constitutional: She is oriented to person, place, and time. She appears well-developed and well-nourished. No distress.  HENT:  Head: Normocephalic and atraumatic.  Eyes: Conjunctivae and EOM are normal.  Neck: Neck supple. No tracheal deviation present.  Cardiovascular: Normal rate.  Pulmonary/Chest: Effort normal. No respiratory distress.  Genitourinary:  Genitourinary Comments: 1 cm nontender mobile well-defined cystic mass in R mid labia majora with sebaceous follicles prominence surrounding. No induration, erythema, warmth, tenderness.  Musculoskeletal: Normal range of motion.  Neurological: She is alert and oriented to person, place, and time.  Skin: Skin is warm and dry.  Psychiatric: She has a normal mood and affect. Her behavior is normal.  Nursing note and vitals reviewed.  BP 126/66 (BP Location: Left Arm, Patient  Position: Sitting, Cuff Size: Large)   Pulse 96   Temp 98.3 F (36.8 C) (Oral)   Resp 16   Ht 5\' 8"  (1.727 m)   Wt 265 lb 12.8 oz (120.6 kg)   SpO2 97%   BMI  40.41 kg/m       Assessment & Plan:   1. Type 2 diabetes mellitus without complication, without long-term current use of insulin (HCC)   2. Hypothyroidism due to medication   3. Cigarette nicotine dependence without complication   4. Murmur, cardiac   5. Mixed hyperlipidemia   6. Hypertension secondary to other renal disorders   7. Vaginal mass   8. Screening for cervical cancer   9. Routine screening for STI (sexually transmitted infection)   10. Labial cyst     Orders Placed This Encounter  Procedures  . Lipid panel    Order Specific Question:   Has the patient fasted?    Answer:   Yes  . TSH  . Comprehensive metabolic panel    Order Specific Question:   Has the patient fasted?    Answer:   Yes  . CBC with Differential/Platelet  . Hemoglobin A1c  . Microalbumin/Creatinine Ratio, Urine  . POCT urinalysis dipstick  . POCT Wet + KOH Prep  . HM DIABETES FOOT EXAM    I personally performed the services described in this documentation, which was scribed in my presence. The recorded information has been reviewed and considered, and addended by me as needed.   [name], M.D.  Primary Care at [facility] [address] [place], [place] [address] [phone] phone [phone] fax  09/20/17 1:54 AM

## 2017-06-29 LAB — CBC WITH DIFFERENTIAL/PLATELET
Basophils Absolute: 0 10*3/uL (ref 0.0–0.2)
Basos: 0 %
EOS (ABSOLUTE): 0.1 10*3/uL (ref 0.0–0.4)
Eos: 1 %
Hematocrit: 42.6 % (ref 34.0–46.6)
Hemoglobin: 13.7 g/dL (ref 11.1–15.9)
Immature Grans (Abs): 0 10*3/uL (ref 0.0–0.1)
Immature Granulocytes: 0 %
Lymphocytes Absolute: 3 10*3/uL (ref 0.7–3.1)
Lymphs: 27 %
MCH: 28.8 pg (ref 26.6–33.0)
MCHC: 32.2 g/dL (ref 31.5–35.7)
MCV: 90 fL (ref 79–97)
Monocytes Absolute: 1 10*3/uL — ABNORMAL HIGH (ref 0.1–0.9)
Monocytes: 9 %
Neutrophils Absolute: 7.1 10*3/uL — ABNORMAL HIGH (ref 1.4–7.0)
Neutrophils: 63 %
Platelets: 388 10*3/uL (ref 150–450)
RBC: 4.76 x10E6/uL (ref 3.77–5.28)
RDW: 14.7 % (ref 12.3–15.4)
WBC: 11.2 10*3/uL — ABNORMAL HIGH (ref 3.4–10.8)

## 2017-06-29 LAB — MICROALBUMIN / CREATININE URINE RATIO
Creatinine, Urine: 18.5 mg/dL
Microalb/Creat Ratio: <16.2 mg/g{creat} (ref 0.0–30.0)
Microalbumin, Urine: <3 ug/mL

## 2017-06-29 LAB — COMPREHENSIVE METABOLIC PANEL WITH GFR
ALT: 10 [IU]/L (ref 0–32)
AST: 11 [IU]/L (ref 0–40)
Albumin/Globulin Ratio: 1.8 (ref 1.2–2.2)
Albumin: 4.5 g/dL (ref 3.6–4.8)
Alkaline Phosphatase: 104 [IU]/L (ref 39–117)
BUN/Creatinine Ratio: 18 (ref 12–28)
BUN: 16 mg/dL (ref 8–27)
Bilirubin Total: 0.4 mg/dL (ref 0.0–1.2)
CO2: 20 mmol/L (ref 20–29)
Calcium: 10 mg/dL (ref 8.7–10.3)
Chloride: 104 mmol/L (ref 96–106)
Creatinine, Ser: 0.89 mg/dL (ref 0.57–1.00)
GFR calc Af Amer: 79 mL/min/{1.73_m2}
GFR calc non Af Amer: 68 mL/min/{1.73_m2}
Globulin, Total: 2.5 g/dL (ref 1.5–4.5)
Glucose: 120 mg/dL — ABNORMAL HIGH (ref 65–99)
Potassium: 4.8 mmol/L (ref 3.5–5.2)
Sodium: 142 mmol/L (ref 134–144)
Total Protein: 7 g/dL (ref 6.0–8.5)

## 2017-06-29 LAB — LIPID PANEL
Chol/HDL Ratio: 2.6 ratio (ref 0.0–4.4)
Cholesterol, Total: 131 mg/dL (ref 100–199)
HDL: 50 mg/dL (ref >39)
LDL Calculated: 69 mg/dL (ref 0–99)
Triglycerides: 59 mg/dL (ref 0–149)
VLDL Cholesterol Cal: 12 mg/dL (ref 5–40)

## 2017-06-29 LAB — HEMOGLOBIN A1C
Est. average glucose Bld gHb Est-mCnc: 134 mg/dL
HEMOGLOBIN A1C: 6.3 % — AB (ref 4.8–5.6)

## 2017-06-29 LAB — TSH: TSH: 0.897 u[IU]/mL (ref 0.450–4.500)

## 2017-06-30 LAB — PAP IG AND HPV HIGH-RISK
HPV, high-risk: NEGATIVE
PAP Smear Comment: 0

## 2017-07-13 DIAGNOSIS — M5417 Radiculopathy, lumbosacral region: Secondary | ICD-10-CM | POA: Diagnosis not present

## 2017-07-13 DIAGNOSIS — M542 Cervicalgia: Secondary | ICD-10-CM | POA: Diagnosis not present

## 2017-07-13 DIAGNOSIS — M6281 Muscle weakness (generalized): Secondary | ICD-10-CM | POA: Diagnosis not present

## 2017-07-13 DIAGNOSIS — R202 Paresthesia of skin: Secondary | ICD-10-CM | POA: Diagnosis not present

## 2017-07-13 DIAGNOSIS — M5481 Occipital neuralgia: Secondary | ICD-10-CM | POA: Diagnosis not present

## 2017-07-13 DIAGNOSIS — M5412 Radiculopathy, cervical region: Secondary | ICD-10-CM | POA: Diagnosis not present

## 2017-07-13 DIAGNOSIS — G5603 Carpal tunnel syndrome, bilateral upper limbs: Secondary | ICD-10-CM | POA: Diagnosis not present

## 2017-07-19 ENCOUNTER — Other Ambulatory Visit: Payer: Self-pay | Admitting: Family Medicine

## 2017-08-31 ENCOUNTER — Telehealth: Payer: Self-pay | Admitting: Family Medicine

## 2017-08-31 NOTE — Telephone Encounter (Signed)
Copied from CRM [phone]. Topic: Quick Communication - See Telephone Encounter >> Aug 31, 2017 11:35 AM Herby AbrahamJohnson, Shiquita C wrote: CRM for notification. See Telephone encounter for: 08/31/17.   CB: [phone]  Pt says that she was seen in June and has another apt on 8/26, pt would like to know if apt is still needed?   Please advise.

## 2017-09-02 NOTE — Telephone Encounter (Signed)
Pt calling and would like a update. Please advise. Pt also stated that she never received lab results.

## 2017-09-07 ENCOUNTER — Other Ambulatory Visit: Payer: Self-pay | Admitting: *Deleted

## 2017-09-07 DIAGNOSIS — E782 Mixed hyperlipidemia: Secondary | ICD-10-CM

## 2017-09-07 NOTE — Telephone Encounter (Signed)
Please advise patient her labs all looked great, but her white blood count was a little elevated, this could be do to many factors she may have been fighting off something that day.  We would like to repeat the CBC at her visit with Dr. Clelia CroftShaw in August.    She is to keep this appointment.  Dr. Leretha PolSantiago went over the lab results

## 2017-09-20 ENCOUNTER — Encounter: Payer: Self-pay | Admitting: Family Medicine

## 2017-09-20 ENCOUNTER — Other Ambulatory Visit: Payer: Self-pay

## 2017-09-20 ENCOUNTER — Ambulatory Visit (INDEPENDENT_AMBULATORY_CARE_PROVIDER_SITE_OTHER): Payer: Medicare HMO

## 2017-09-20 ENCOUNTER — Ambulatory Visit (INDEPENDENT_AMBULATORY_CARE_PROVIDER_SITE_OTHER): Payer: Medicare HMO | Admitting: Family Medicine

## 2017-09-20 ENCOUNTER — Telehealth: Payer: Self-pay | Admitting: Family Medicine

## 2017-09-20 VITALS — BP 140/88 | HR 82 | Temp 99.0°F | Resp 16 | Ht 68.5 in | Wt 263.0 lb

## 2017-09-20 DIAGNOSIS — G4700 Insomnia, unspecified: Secondary | ICD-10-CM | POA: Diagnosis not present

## 2017-09-20 DIAGNOSIS — S6991XA Unspecified injury of right wrist, hand and finger(s), initial encounter: Secondary | ICD-10-CM

## 2017-09-20 DIAGNOSIS — Z1212 Encounter for screening for malignant neoplasm of rectum: Secondary | ICD-10-CM

## 2017-09-20 DIAGNOSIS — F410 Panic disorder [episodic paroxysmal anxiety] without agoraphobia: Secondary | ICD-10-CM | POA: Diagnosis not present

## 2017-09-20 DIAGNOSIS — D72829 Elevated white blood cell count, unspecified: Secondary | ICD-10-CM

## 2017-09-20 DIAGNOSIS — Z6841 Body Mass Index (BMI) 40.0 and over, adult: Secondary | ICD-10-CM | POA: Diagnosis not present

## 2017-09-20 DIAGNOSIS — F418 Other specified anxiety disorders: Secondary | ICD-10-CM

## 2017-09-20 DIAGNOSIS — E119 Type 2 diabetes mellitus without complications: Secondary | ICD-10-CM | POA: Diagnosis not present

## 2017-09-20 DIAGNOSIS — Z720 Tobacco use: Secondary | ICD-10-CM

## 2017-09-20 DIAGNOSIS — E785 Hyperlipidemia, unspecified: Secondary | ICD-10-CM | POA: Diagnosis not present

## 2017-09-20 DIAGNOSIS — Z113 Encounter for screening for infections with a predominantly sexual mode of transmission: Secondary | ICD-10-CM

## 2017-09-20 DIAGNOSIS — E038 Other specified hypothyroidism: Secondary | ICD-10-CM | POA: Diagnosis not present

## 2017-09-20 DIAGNOSIS — F430 Acute stress reaction: Secondary | ICD-10-CM

## 2017-09-20 DIAGNOSIS — Z114 Encounter for screening for human immunodeficiency virus [HIV]: Secondary | ICD-10-CM | POA: Diagnosis not present

## 2017-09-20 DIAGNOSIS — I10 Essential (primary) hypertension: Secondary | ICD-10-CM | POA: Diagnosis not present

## 2017-09-20 DIAGNOSIS — Z1231 Encounter for screening mammogram for malignant neoplasm of breast: Secondary | ICD-10-CM

## 2017-09-20 DIAGNOSIS — Z1159 Encounter for screening for other viral diseases: Secondary | ICD-10-CM | POA: Diagnosis not present

## 2017-09-20 DIAGNOSIS — M25531 Pain in right wrist: Secondary | ICD-10-CM | POA: Diagnosis not present

## 2017-09-20 DIAGNOSIS — Z1211 Encounter for screening for malignant neoplasm of colon: Secondary | ICD-10-CM

## 2017-09-20 DIAGNOSIS — E2839 Other primary ovarian failure: Secondary | ICD-10-CM

## 2017-09-20 DIAGNOSIS — R0981 Nasal congestion: Secondary | ICD-10-CM

## 2017-09-20 LAB — POCT GLYCOSYLATED HEMOGLOBIN (HGB A1C): HEMOGLOBIN A1C: 6.5 % — AB (ref 4.0–5.6)

## 2017-09-20 MED ORDER — LOSARTAN POTASSIUM 100 MG PO TABS: 100.0000 mg | ORAL_TABLET | Freq: Every day | ORAL | 0 refills | Status: DC

## 2017-09-20 MED ORDER — LEVOTHYROXINE SODIUM 125 MCG PO TABS: 125.0000 ug | ORAL_TABLET | Freq: Every day | ORAL | 1 refills | Status: DC

## 2017-09-20 MED ORDER — BUPROPION HCL ER (XL) 300 MG PO TB24: 300.0000 mg | ORAL_TABLET | Freq: Every day | ORAL | 1 refills | Status: DC

## 2017-09-20 MED ORDER — METOPROLOL SUCCINATE ER 25 MG PO TB24: 25.0000 mg | ORAL_TABLET | Freq: Every day | ORAL | 3 refills | Status: DC

## 2017-09-20 MED ORDER — CLONAZEPAM 1 MG PO TABS: ORAL_TABLET | ORAL | 1 refills | Status: DC

## 2017-09-20 MED ORDER — METFORMIN HCL 1000 MG PO TABS: ORAL_TABLET | ORAL | 3 refills | Status: DC

## 2017-09-20 MED ORDER — ALPRAZOLAM 0.5 MG PO TABS: ORAL_TABLET | ORAL | 1 refills | Status: DC

## 2017-09-20 MED ORDER — LOSARTAN POTASSIUM 100 MG PO TABS: 100.0000 mg | ORAL_TABLET | Freq: Every day | ORAL | 1 refills | Status: DC

## 2017-09-20 MED ORDER — AZELASTINE HCL 0.1 % NA SOLN: 2.0000 | Freq: Two times a day (BID) | NASAL | 2 refills | Status: DC

## 2017-09-20 NOTE — Progress Notes (Addendum)
Subjective:    Patient ID: Jasmine Long, female    DOB: [birth year], 67 y.o.   MRN: [id number] Chief Complaint  Patient presents with  . Diabetes    3 month follow-up   . Hypothyroidism  . Hypertension    HPI  Ms. Jasmine Long is a 67 yo woman who is here for a 3 mo f/u on her chronic medical conditions.  Upon her birthday this spring, she finally became age eligible for Medicare which has been wonderful as prior she was self pay and so had sig restrictions on testing and treatments that were available to her.  DMII: Diagnosed .   Lab Results  Component Value Date   HGBA1C 6.3 (H) 06/28/2017   HGBA1C 7.0 (H) 01/30/2016   HGBA1C 6.5 05/15/2015   CBGs: fasting a.m. ; after meal  ; No hypoglycemic episodes.  Meter type:  Diet:  Exercising:  DM Med Regimen: metformin 1g bid Prior changes:   eGFR: 65 Baseline Cr:0.9  Last checked 06/28/17 Microalb: Normal on 06/28/17. On losartan 100 Lipids:  LDL 69,  non-HDL 81.  Last levels done 06/28/17  - were improving from prior. NOT on statin. Taking asa 81 qd?  Optho: Seen annually by ??- last exam ? Feet: Monofilament exam done today. Denies any no problems.  Not seen by podiatry prior.  Immunizations:  Influenza:  Pneumovax-23:  Hypothyroid: tsh has been stable - on the low side of the normal range for last several checks on levothyroxine 125. Lab Results  Component Value Date   TSH 0.897 06/28/2017   TSH 1.420 01/30/2016   TSH 1.232 09/19/2014    Her son Molly Maduro has moved down to [facility] since the beginning of this year and is doing very well.  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  Left leg numb constantly laterally with shooting pains from groin down anterior and when it happens sometimes leg gives out- fell 5 ft 9 in flat onto her back.  When her leg gave out last Thursday she tried to grab onto her door  frame but couldn't bear weight and then ulnar aspect of wrist came down hard onto tile floor in kitchen - called for appointment but couldn't get in.    Since she got a report stating she had a brain lesion it has caused her anxiety to spike causing her BP to go up.    She never knew that Dr. Tresa Endo started on the toprol in April - Dr. Tresa Endo sugested that she might have OSA but pt refused to consider this.    GERD causing CP.  Using more alprazolam at xmas, his bday, his death day, now when she was worried about her brain.  No interest in smoking cessation  Past Medical History:  Diagnosis Date  . Allergy   . Anxiety   . Depression   . Heart murmur   . Thyroid disease   . Ulcer    Past Surgical History:  Procedure Laterality Date  . BREAST SURGERY    . CESAREAN SECTION    . CHOLECYSTECTOMY    . NASAL SEPTUM SURGERY     Current Outpatient Medications on File Prior to Visit  Medication Sig Dispense Refill  . buPROPion (WELLBUTRIN XL) 300 MG 24 hr tablet Take 1 tablet (300 mg total) by mouth daily. 90 tablet 0  .  levothyroxine (SYNTHROID, LEVOTHROID) 125 MCG tablet Take 1 tablet (125 mcg total) by mouth daily before breakfast. 90 tablet 1  . losartan (COZAAR) 100 MG tablet Take 1 tablet (100 mg total) by mouth daily. 90 tablet 0  . metFORMIN (GLUCOPHAGE) 1000 MG tablet TAKE 1 TABLET BY MOUTH 2 TIMES DAILY WITH MEALS 180 tablet 0   No current facility-administered medications on file prior to visit.    No Known Allergies Family History  Problem Relation Age of Onset  . Lung cancer Mother   . Dementia Father   . Atrial fibrillation Father    Social History   Socioeconomic History  . Marital status: Divorced    Spouse name: Not on file  . Number of children: Not on file  . Years of education: Not on file  . Highest education level: Not on file  Occupational History  . Occupation: Unemployed  Social Needs  . Financial resource strain: Not on file  . Food insecurity:     Worry: Not on file    Inability: Not on file  . Transportation needs:    Medical: Not on file    Non-medical: Not on file  Tobacco Use  . Smoking status: Current Every Day Smoker    Packs/day: 1.50    Years: 40.00    Pack years: 60.00    Types: Cigarettes    Last attempt to quit: 01/20/2012    Years since quitting: 5.6  . Smokeless tobacco: Never Used  . Tobacco comment: Pt doing E-cigarette as of 01/20/12  Substance and Sexual Activity  . Alcohol use: No  . Drug use: No  . Sexual activity: Never  Lifestyle  . Physical activity:    Days per week: Not on file    Minutes per session: Not on file  . Stress: Not on file  Relationships  . Social connections:    Talks on phone: Not on file    Gets together: Not on file    Attends religious service: Not on file    Active member of club or organization: Not on file    Attends meetings of clubs or organizations: Not on file    Relationship status: Not on file  Other Topics Concern  . Not on file  Social History Narrative   Patient on worker's compensation for an injury sustained on the job 5 years ago.   Depression screen [facility] 2/9 09/20/2017 06/28/2017 04/30/2017 04/01/2017 03/12/2017  Decreased Interest 3 0 0 0 0  Down, Depressed, Hopeless 3 0 0 0 0  PHQ - 2 Score 6 0 0 0 0  Altered sleeping 0 - - - -  Tired, decreased energy 1 - - - -  Change in appetite 0 - - - -  Feeling bad or failure about yourself  3 - - - -  Trouble concentrating 0 - - - -  Moving slowly or fidgety/restless 0 - - - -  Suicidal thoughts 0 - - - -  PHQ-9 Score 10 - - - -  Difficult doing work/chores - - - - -     Review of Systems See hpi    Objective:   Physical Exam  Constitutional: She is oriented to person, place, and time. She appears well-developed and well-nourished. No distress.  HENT:  Head: Normocephalic and atraumatic.  Right Ear: External ear normal.  Left Ear: External ear normal.  Eyes: Conjunctivae are normal. No scleral icterus.    Neck: Normal range of motion. Neck supple. No thyromegaly  present.  Cardiovascular: Normal rate, regular rhythm, normal heart sounds and intact distal pulses.  Pulmonary/Chest: Effort normal and breath sounds normal. No respiratory distress.  Musculoskeletal: She exhibits no edema.  Lymphadenopathy:    She has no cervical adenopathy.  Neurological: She is alert and oriented to person, place, and time.  Skin: Skin is warm and dry. She is not diaphoretic. No erythema.  Psychiatric: She has a normal mood and affect. Her behavior is normal.   BP (!) 152/70   Pulse 95   Temp 99 F (37.2 C) (Oral)   Resp 16   Ht 5' 8.5" (1.74 m)   Wt 263 lb (119.3 kg)   SpO2 96%   BMI 39.40 kg/m      Results for orders placed or performed in visit on 09/20/17  POCT glycosylated hemoglobin (Hb A1C)  Result Value Ref Range   Hemoglobin A1C 6.5 (A) 4.0 - 5.6 %   HbA1c POC (<> result, manual entry)     HbA1c, POC (prediabetic range)     HbA1c, POC (controlled diabetic range)     Diabetic Foot Exam - Simple   Simple Foot Form Visual Inspection No deformities, no ulcerations, no other skin breakdown bilaterally:  Yes Sensation Testing Intact to touch and monofilament testing bilaterally:  Yes Pulse Check Posterior Tibialis and Dorsalis pulse intact bilaterally:  Yes Comments     Narrative    CLINICAL DATA: Right wrist pain and swelling since 09/16/2017 when the patient suffered an injury due to her legs giving way. Initial encounter.  EXAM: RIGHT WRIST - COMPLETE 3+ VIEW  COMPARISON: None.  FINDINGS: No acute bony or joint abnormality is identified. Advanced first CMC osteoarthritis is noted. Soft tissues appear normal.  IMPRESSION: No acute abnormality.  Advanced first CMC osteoarthritis.    Assessment & Plan:   1. Type 2 diabetes mellitus without complication, without long-term current use of insulin (HCC) - Refer to ophtho.  Pt refuses prevnar-13 today. a1c well controlled  on current regimen of metformin 1g bid Lab Results  Component Value Date   HGBA1C 6.5 (A) 09/20/2017   HGBA1C 6.3 (H) 06/28/2017   HGBA1C 7.0 (H) 01/30/2016     2. Other specified hypothyroidism - pt has been OUT of her levothyroxine for a while so no point in checking tsh today - cancelled lab - have her restart levothyroxine and make sure she is taking at f/u OV - tsh has been stable for last sev yrs Lab Results  Component Value Date   TSH 0.897 06/28/2017   TSH 1.420 01/30/2016   TSH 1.232 09/19/2014     3. Essential hypertension - BP up due to pain and anxiety.  Needs to start TOPROL per instructions of cardiology Dr. Tresa Endo 4 mos ago - pt agrees - toprol xl 25 sent to pharm (was what Dr. Tresa Endo had ordered at their last OV).  4. Hyperlipidemia, unspecified hyperlipidemia type - not fasting today last lipids at goal 06/28/17 <3 mos ago w/ LDL 69 and non-HDL 81  5. Tobacco abuse - refused to discuss cessation  6. BMI 40.0-44.9, adult (HCC)   7. Depression with anxiety   8. Insomnia, unspecified type   9. Panic attack as reaction to stress   10. Leukocytosis, unspecified type   11. Routine screening for STI (sexually transmitted infection)   12. Screening for HIV (human immunodeficiency virus) - One time hiv screen  13. Need for hepatitis C screening test  - One time hep C screen  14. Encounter for screening mammogram for breast cancer - Gave info to get mammogram and dexa scan at breast center  15. Screening for colorectal cancer - Was supposed to have colonoscopy repeated in 2012 - 3 years after 2009 initial - with Dr. Marina Goodell at [facility] - placed referral back   16. Estrogen deficiency   17. Right wrist injury, initial encounter - xray today shows severe OA at first [facility] but nothing over the ulnar aspect where she has had pain and swelling since FOOSH 4d ago - rec RICE, could brace, could try topical diclofenac - if continues to have problems, refer to hand surgery for further eval   18. Sinus congestion    Sched Welcome To Medicare OV in 3 mos, then f/u for med refills in 6 mos, then do CPE (for supplemental [employer] ins) in 9 mos.  Orders Placed This Encounter  Procedures  . MM Digital Screening    Standing Status:   Future    Standing Expiration Date:   11/21/2018    Order Specific Question:   Reason for Exam (SYMPTOM  OR DIAGNOSIS REQUIRED)    Answer:   screening    Order Specific Question:   Preferred imaging location?    Answer:   [facility]  . DG Bone Density    Standing Status:   Future    Standing Expiration Date:   11/21/2018    Order Specific Question:   Reason for Exam (SYMPTOM  OR DIAGNOSIS REQUIRED)    Answer:   estrogen deficiency    Order Specific Question:   Preferred imaging location?    Answer:   [facility]  . DG Wrist Complete Right    Standing Status:   Future    Number of Occurrences:   1    Standing Expiration Date:   09/20/2018    Order Specific Question:   Reason for Exam (SYMPTOM  OR DIAGNOSIS REQUIRED)    Answer:   hit radial aspect of wrist when leg gave out 4d ago - h/o 2 wrist fractures mult decades prior - ttp over anatomic snuffbox and thenar eminence, decreased ROM at first MCP    Order Specific Question:   Preferred imaging location?    Answer:   [facility]  . Pneumococcal conjugate vaccine 13-valent IM  . Comprehensive metabolic panel  . CBC with Differential/Platelet  . HCV Ab w/Rflx to Verification  . HIV antibody  . Ambulatory referral to Ophthalmology    Referral Priority:   Routine    Referral Type:   Consultation    Referral Reason:   Specialty Services Required    Requested Specialty:   Ophthalmology    Number of Visits Requested:   1  . Ambulatory referral to Gastroenterology    Referral Priority:   Routine    Referral Type:   Consultation    Referral Reason:   Specialty Services Required    Referred to Provider:   Hilarie Fredrickson, MD    Number of Visits Requested:   1  . POCT glycosylated  hemoglobin (Hb A1C)  . HM DIABETES FOOT EXAM    Meds ordered this encounter  Medications  . clonazePAM (KLONOPIN) 1 MG tablet    Sig: TAKE 1 TABLET BY MOUTH AT BEDTIME FOR SLEEP OR ANXIETY    Dispense:  30 tablet    Refill:  1  . ALPRAZolam (XANAX) 0.5 MG tablet    Sig: TAKE 1 TO 2 TABLETS BY MOUTH TWICE DAILY FOR ANXIETY  Dispense:  60 tablet    Refill:  1  . metoprolol succinate (TOPROL XL) 25 MG 24 hr tablet    Sig: Take 1 tablet (25 mg total) by mouth daily.    Dispense:  90 tablet    Refill:  3  . buPROPion (WELLBUTRIN XL) 300 MG 24 hr tablet    Sig: Take 1 tablet (300 mg total) by mouth daily.    Dispense:  90 tablet    Refill:  1  . losartan (COZAAR) 100 MG tablet    Sig: Take 1 tablet (100 mg total) by mouth daily.    Dispense:  90 tablet    Refill:  1  . metFORMIN (GLUCOPHAGE) 1000 MG tablet    Sig: TAKE 1 TABLET BY MOUTH 2 TIMES DAILY WITH MEALS    Dispense:  180 tablet    Refill:  3   Today I have utilized the [place] Controlled Substance Registry's online query to confirm compliance regarding the patient's controlled medications. My review reveals appropriate prescription fills and that I am the sole provider of these medications. Rechecks will occur regularly and the patient is aware of our use of the system.  Over 40 min spent in face-to-face evaluation of and consultation with patient and coordination of care.  Over 50% of this time was spent counseling this patient regarding concerns over abnml MRI 2017, wrist injury, sinus complaints, chronic disease, uncontrolled HTN, new BP med that cardiology started 4 mos ago that pt is concerned about, etc   [name], M.D.  Primary Care at [facility] [address] [place], [place] [address] [phone] phone [phone] fax  09/20/17 11:18 AM

## 2017-09-20 NOTE — Telephone Encounter (Signed)
Copied from CRM [id number]. Topic: General - Other >> Sep 20, 2017 12:10 PM Darletta MollLander, Jasmine Long wrote: Reason for CRM: [facility] and it was perfect. Says she does not need one. [facility] is the office that ordered it.

## 2017-09-20 NOTE — Patient Instructions (Addendum)
Start the toprol 25mg  a day - I sent a rx to your pharmacy.   You need an eye doctor exam so I have referred you to an ophthamologist through your medical insurance as it will be covered due to the diabetes.  You need to schedule your mammogram and your DEXA bone scan.  [facility] at [phone] to schedule.  I have already sent in the orders for this to be done.  You are long overdue for your colonoscopy with Dr. Marina GoodellPerry at [facility] - you had precancerous polyp in 2009 so it was supposed to be repeated in 2012 - I have sent a referral to [facility] to get you set up for your colonoscopy so they will be calling you shortly to schedule an appointment.  Try the azelastine nasal spray and mucinex and allegra for your sinus congestion.  If it continues, come back for an appointment where we have time to evaluate this,   If you have lab work done today you will be contacted with your lab results within the next 2 weeks if there are any abnormals.  If you have not heard from us then please contact us. The fastest way to get your results is to register for My Chart.   IF you received an x-ray today, you will receive an invoice from [facility]. Please contact [facility] at [phone] with questions or concerns regarding your invoice.   IF you received labwork today, you will receive an invoice from [facility]. Please contact [facility] at [phone] with questions or concerns regarding your invoice.   Our billing staff will not be able to assist you with questions regarding bills from these companies.  You will be contacted with the lab results as soon as they are available. The fastest way to get your results is to activate your My Chart account. Instructions are located on the last page of this paperwork. If you have not heard from us regarding the results in 2 weeks, please contact this office.      Sinusitis, Adult Sinusitis is  soreness and inflammation of your sinuses. Sinuses are hollow spaces in the bones around your face. Your sinuses are located:  Around your eyes.  In the middle of your forehead.  Behind your nose.  In your cheekbones.  Your sinuses and nasal passages are lined with a stringy fluid (mucus). Mucus normally drains out of your sinuses. When your nasal tissues become inflamed or swollen, the mucus can become trapped or blocked so air cannot flow through your sinuses. This allows bacteria, viruses, and funguses to grow, which leads to infection. Sinusitis can develop quickly and last for 7?10 days (acute) or for more than 12 weeks (chronic). Sinusitis often develops after a cold. What are the causes? This condition is caused by anything that creates swelling in the sinuses or stops mucus from draining, including:  Allergies.  Asthma.  Bacterial or viral infection.  Abnormally shaped bones between the nasal passages.  Nasal growths that contain mucus (nasal polyps).  Narrow sinus openings.  Pollutants, such as chemicals or irritants in the air.  A foreign object stuck in the nose.  A fungal infection. This is rare.  What increases the risk? The following factors may make you more likely to develop this condition:  Having allergies or asthma.  Having had a recent cold or respiratory tract infection.  Having structural deformities or blockages in your nose or sinuses.  Having a weak immune  system.  Doing a lot of swimming or diving.  Overusing nasal sprays.  Smoking.  What are the signs or symptoms? The main symptoms of this condition are pain and a feeling of pressure around the affected sinuses. Other symptoms include:  Upper toothache.  Earache.  Headache.  Bad breath.  Decreased sense of smell and taste.  A cough that may get worse at night.  Fatigue.  Fever.  Thick drainage from your nose. The drainage is often green and it may contain pus  (purulent).  Stuffy nose or congestion.  Postnasal drip. This is when extra mucus collects in the throat or back of the nose.  Swelling and warmth over the affected sinuses.  Sore throat.  Sensitivity to light.  How is this diagnosed? This condition is diagnosed based on symptoms, a medical history, and a physical exam. To find out if your condition is acute or chronic, your health care provider may:  Look in your nose for signs of nasal polyps.  Tap over the affected sinus to check for signs of infection.  View the inside of your sinuses using an imaging device that has a light attached (endoscope).  If your health care provider suspects that you have chronic sinusitis, you may also:  Be tested for allergies.  Have a sample of mucus taken from your nose (nasal culture) and checked for bacteria.  Have a mucus sample examined to see if your sinusitis is related to an allergy.  If your sinusitis does not respond to treatment and it lasts longer than 8 weeks, you may have an MRI or CT scan to check your sinuses. These scans also help to determine how severe your infection is. In rare cases, a bone biopsy may be done to rule out more serious types of fungal sinus disease. How is this treated? Treatment for sinusitis depends on the cause and whether your condition is chronic or acute. If a virus is causing your sinusitis, your symptoms will go away on their own within 10 days. You may be given medicines to relieve your symptoms, including:  Topical nasal decongestants. They shrink swollen nasal passages and let mucus drain from your sinuses.  Antihistamines. These drugs block inflammation that is triggered by allergies. This can help to ease swelling in your nose and sinuses.  Topical nasal corticosteroids. These are nasal sprays that ease inflammation and swelling in your nose and sinuses.  Nasal saline washes. These rinses can help to get rid of thick mucus in your nose.  If  your condition is caused by bacteria, you will be given an antibiotic medicine. If your condition is caused by a fungus, you will be given an antifungal medicine. Surgery may be needed to correct underlying conditions, such as narrow nasal passages. Surgery may also be needed to remove polyps. Follow these instructions at home: Medicines  Take, use, or apply over-the-counter and prescription medicines only as told by your health care provider. These may include nasal sprays.  If you were prescribed an antibiotic medicine, take it as told by your health care provider. Do not stop taking the antibiotic even if you start to feel better. Hydrate and Humidify  Drink enough water to keep your urine clear or pale yellow. Staying hydrated will help to thin your mucus.  Use a cool mist humidifier to keep the humidity level in your home above 50%.  Inhale steam for 10-15 minutes, 3-4 times a day or as told by your health care provider. You can do  this in the bathroom while a hot shower is running.  Limit your exposure to cool or dry air. Rest  Rest as much as possible.  Sleep with your head raised (elevated).  Make sure to get enough sleep each night. General instructions  Apply a warm, moist washcloth to your face 3-4 times a day or as told by your health care provider. This will help with discomfort.  Wash your hands often with soap and water to reduce your exposure to viruses and other germs. If soap and water are not available, use hand sanitizer.  Do not smoke. Avoid being around people who are smoking (secondhand smoke).  Keep all follow-up visits as told by your health care provider. This is important. Contact a health care provider if:  You have a fever.  Your symptoms get worse.  Your symptoms do not improve within 10 days. Get help right away if:  You have a severe headache.  You have persistent vomiting.  You have pain or swelling around your face or eyes.  You have  vision problems.  You develop confusion.  Your neck is stiff.  You have trouble breathing. This information is not intended to replace advice given to you by your health care provider. Make sure you discuss any questions you have with your health care provider. Document Released: 01/12/2005 Document Revised: 09/08/2015 Document Reviewed: 11/07/2014 Elsevier Interactive Patient Education  [employer].

## 2017-09-20 NOTE — Addendum Note (Signed)
Addended by: Sherren MochaSHAW, Wasil Wolke N on: 09/20/2017 02:08 AM   Modules accepted: Orders

## 2017-09-21 LAB — CBC WITH DIFFERENTIAL/PLATELET
BASOS: 0 %
Basophils Absolute: 0 10*3/uL (ref 0.0–0.2)
EOS (ABSOLUTE): 0.1 10*3/uL (ref 0.0–0.4)
EOS: 1 %
HEMATOCRIT: 41.7 % (ref 34.0–46.6)
Hemoglobin: 14 g/dL (ref 11.1–15.9)
Immature Grans (Abs): 0 10*3/uL (ref 0.0–0.1)
Immature Granulocytes: 0 %
LYMPHS ABS: 2.5 10*3/uL (ref 0.7–3.1)
Lymphs: 29 %
MCH: 30.4 pg (ref 26.6–33.0)
MCHC: 33.6 g/dL (ref 31.5–35.7)
MCV: 91 fL (ref 79–97)
MONOS ABS: 0.8 10*3/uL (ref 0.1–0.9)
Monocytes: 9 %
Neutrophils Absolute: 5.2 10*3/uL (ref 1.4–7.0)
Neutrophils: 61 %
Platelets: 354 10*3/uL (ref 150–450)
RBC: 4.61 x10E6/uL (ref 3.77–5.28)
RDW: 14.5 % (ref 12.3–15.4)
WBC: 8.6 10*3/uL (ref 3.4–10.8)

## 2017-09-21 LAB — COMPREHENSIVE METABOLIC PANEL
ALBUMIN: 4.3 g/dL (ref 3.6–4.8)
ALK PHOS: 101 IU/L (ref 39–117)
ALT: 13 IU/L (ref 0–32)
AST: 12 IU/L (ref 0–40)
Albumin/Globulin Ratio: 1.7 (ref 1.2–2.2)
BUN / CREAT RATIO: 12 (ref 12–28)
BUN: 12 mg/dL (ref 8–27)
Bilirubin Total: 0.4 mg/dL (ref 0.0–1.2)
CO2: 22 mmol/L (ref 20–29)
CREATININE: 1 mg/dL (ref 0.57–1.00)
Calcium: 9.5 mg/dL (ref 8.7–10.3)
Chloride: 107 mmol/L — ABNORMAL HIGH (ref 96–106)
GFR calc Af Amer: 68 mL/min/{1.73_m2} (ref >59)
GFR calc non Af Amer: 59 mL/min/{1.73_m2} — ABNORMAL LOW (ref >59)
GLOBULIN, TOTAL: 2.5 g/dL (ref 1.5–4.5)
Glucose: 112 mg/dL — ABNORMAL HIGH (ref 65–99)
Potassium: 5.3 mmol/L — ABNORMAL HIGH (ref 3.5–5.2)
SODIUM: 143 mmol/L (ref 134–144)
Total Protein: 6.8 g/dL (ref 6.0–8.5)

## 2017-09-21 LAB — HCV AB W/RFLX TO VERIFICATION: HCV Ab: <0.1 s/co ratio (ref 0.0–0.9)

## 2017-09-21 LAB — HCV INTERPRETATION

## 2017-09-21 LAB — HIV ANTIBODY (ROUTINE TESTING W REFLEX): HIV Screen 4th Generation wRfx: NONREACTIVE

## 2017-09-25 ENCOUNTER — Telehealth: Payer: Self-pay | Admitting: Family Medicine

## 2017-09-25 NOTE — Telephone Encounter (Signed)
Pt. Is requesting medication prescribed on 09/20/17, azelastine, be changed to some non-nasal spray type medication for sinuses. Pt. Asserts she is unable to take sprays

## 2017-10-01 NOTE — Telephone Encounter (Signed)
Dr Creta Levin please advise. DGaddy, CMA

## 2017-10-02 NOTE — Telephone Encounter (Signed)
Pt is calling back to check the status of this medication request.  Due to multiple sinus surgeries, she cannot use nasal sprays.  Please advise

## 2017-10-14 ENCOUNTER — Ambulatory Visit (INDEPENDENT_AMBULATORY_CARE_PROVIDER_SITE_OTHER): Payer: Medicare HMO | Admitting: Emergency Medicine

## 2017-10-14 ENCOUNTER — Encounter: Payer: Self-pay | Admitting: Emergency Medicine

## 2017-10-14 ENCOUNTER — Other Ambulatory Visit: Payer: Self-pay

## 2017-10-14 VITALS — BP 134/81 | HR 89 | Temp 98.9°F | Resp 16 | Ht 67.75 in | Wt 258.6 lb

## 2017-10-14 DIAGNOSIS — J0191 Acute recurrent sinusitis, unspecified: Secondary | ICD-10-CM

## 2017-10-14 DIAGNOSIS — R0981 Nasal congestion: Secondary | ICD-10-CM | POA: Insufficient documentation

## 2017-10-14 DIAGNOSIS — J0101 Acute recurrent maxillary sinusitis: Secondary | ICD-10-CM | POA: Insufficient documentation

## 2017-10-14 MED ORDER — METHYLPREDNISOLONE SODIUM SUCC 125 MG IJ SOLR
125.0000 mg | Freq: Once | INTRAMUSCULAR | Status: AC
  Administered 2017-10-14: 125 mg via INTRAMUSCULAR

## 2017-10-14 MED ORDER — AMOXICILLIN-POT CLAVULANATE 875-125 MG PO TABS: 1.0000 | ORAL_TABLET | Freq: Two times a day (BID) | ORAL | 0 refills | Status: AC

## 2017-10-14 NOTE — Patient Instructions (Addendum)
   If you have lab work done today you will be contacted with your lab results within the next 2 weeks.  If you have not heard from us then please contact us. The fastest way to get your results is to register for My Chart.   IF you received an x-ray today, you will receive an invoice from [facility]. Please contact [facility] at [phone] with questions or concerns regarding your invoice.   IF you received labwork today, you will receive an invoice from [facility]. Please contact [facility] at [phone] with questions or concerns regarding your invoice.   Our billing staff will not be able to assist you with questions regarding bills from these companies.  You will be contacted with the lab results as soon as they are available. The fastest way to get your results is to activate your My Chart account. Instructions are located on the last page of this paperwork. If you have not heard from us regarding the results in 2 weeks, please contact this office.     Sinusitis, Adult Sinusitis is soreness and inflammation of your sinuses. Sinuses are hollow spaces in the bones around your face. They are located:  Around your eyes.  In the middle of your forehead.  Behind your nose.  In your cheekbones.  Your sinuses and nasal passages are lined with a stringy fluid (mucus). Mucus normally drains out of your sinuses. When your nasal tissues get inflamed or swollen, the mucus can get trapped or blocked so air cannot flow through your sinuses. This lets bacteria, viruses, and funguses grow, and that leads to infection. Follow these instructions at home: Medicines  Take, use, or apply over-the-counter and prescription medicines only as told by your doctor. These may include nasal sprays.  If you were prescribed an antibiotic medicine, take it as told by your doctor. Do not stop taking the antibiotic even if you start to feel better. Hydrate and Humidify  Drink enough  water to keep your pee (urine) clear or pale yellow.  Use a cool mist humidifier to keep the humidity level in your home above 50%.  Breathe in steam for 10-15 minutes, 3-4 times a day or as told by your doctor. You can do this in the bathroom while a hot shower is running.  Try not to spend time in cool or dry air. Rest  Rest as much as possible.  Sleep with your head raised (elevated).  Make sure to get enough sleep each night. General instructions  Put a warm, moist washcloth on your face 3-4 times a day or as told by your doctor. This will help with discomfort.  Wash your hands often with soap and water. If there is no soap and water, use hand sanitizer.  Do not smoke. Avoid being around people who are smoking (secondhand smoke).  Keep all follow-up visits as told by your doctor. This is important. Contact a doctor if:  You have a fever.  Your symptoms get worse.  Your symptoms do not get better within 10 days. Get help right away if:  You have a very bad headache.  You cannot stop throwing up (vomiting).  You have pain or swelling around your face or eyes.  You have trouble seeing.  You feel confused.  Your neck is stiff.  You have trouble breathing. This information is not intended to replace advice given to you by your health care provider. Make sure you discuss any questions you have with your health   care provider. Document Released: 07/01/2007 Document Revised: 09/08/2015 Document Reviewed: 11/07/2014 Elsevier Interactive Patient Education  2018 Elsevier Inc.  

## 2017-10-14 NOTE — Progress Notes (Signed)
Jasmine Long 67 y.o.   Chief Complaint  Patient presents with  . Nasal Congestion    per patient since May - nasal drainage    HISTORY OF PRESENT ILLNESS: This is a 67 y.o. female complaining of persistent nasal drainage with congestion for the past 4 to 5 months.  No other significant symptoms.  Has a history of chronic sinus problems.  HPI   Prior to Admission medications   Medication Sig Start Date End Date Taking? Authorizing Provider  ALPRAZolam Prudy Feeler) 0.5 MG tablet TAKE 1 TO 2 TABLETS BY MOUTH TWICE DAILY FOR ANXIETY 09/20/17  Yes Sherren Mocha, MD  buPROPion (WELLBUTRIN XL) 300 MG 24 hr tablet Take 1 tablet (300 mg total) by mouth daily. 09/20/17  Yes Sherren Mocha, MD  clonazePAM (KLONOPIN) 1 MG tablet TAKE 1 TABLET BY MOUTH AT BEDTIME FOR SLEEP OR ANXIETY 09/20/17  Yes Sherren Mocha, MD  levothyroxine (SYNTHROID, LEVOTHROID) 125 MCG tablet Take 1 tablet (125 mcg total) by mouth daily before breakfast. 09/20/17  Yes Sherren Mocha, MD  losartan (COZAAR) 100 MG tablet Take 1 tablet (100 mg total) by mouth daily. 09/20/17  Yes Sherren Mocha, MD  metFORMIN (GLUCOPHAGE) 1000 MG tablet TAKE 1 TABLET BY MOUTH 2 TIMES DAILY WITH MEALS 09/20/17  Yes Sherren Mocha, MD  amoxicillin-clavulanate (AUGMENTIN) 875-125 MG tablet Take 1 tablet by mouth 2 (two) times daily for 7 days. 10/14/17 10/21/17  [name], MD  azelastine (ASTELIN) 0.1 % nasal spray Place 2 sprays into both nostrils 2 (two) times daily. For sinus congestion from allergies Patient not taking: Reported on 10/14/2017 09/20/17   Sherren Mocha, MD  metoprolol succinate (TOPROL XL) 25 MG 24 hr tablet Take 1 tablet (25 mg total) by mouth daily. Patient not taking: Reported on 10/14/2017 09/20/17   Sherren Mocha, MD    No Known Allergies  Patient Active Problem List   Diagnosis Date Noted  . Nonspecific chest pain 04/30/2017  . Epigastric pain 04/30/2017  . History of peptic ulcer 04/30/2017  . Hyperlipidemia 12/11/2015  . Insomnia  12/11/2015  . Panic attack as reaction to stress 12/11/2015  . Tobacco abuse 12/11/2015  . Chronic sinusitis 12/11/2015  . Diabetes (HCC) 05/15/2015  . Hypothyroid 04/29/2011  . BMI 40.0-44.9, adult (HCC) 04/29/2011  . Depression with anxiety 04/29/2011  . GERD (gastroesophageal reflux disease) 04/29/2011  . 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  . Nicotine addiction 04/29/2011  . Hypertension 04/29/2011  . Murmur, cardiac 04/29/2011    Past Medical History:  Diagnosis Date  . Allergy   . Anxiety   . Depression   . Heart murmur   . Thyroid disease   . Ulcer     Past Surgical History:  Procedure Laterality Date  . BREAST SURGERY    . CESAREAN SECTION    . CHOLECYSTECTOMY    . NASAL SEPTUM SURGERY      Social History   Socioeconomic History  . Marital status: Divorced    Spouse name: Not on file  . Number of children: Not on file  . Years of education: Not on file  . Highest education level: Not on file  Occupational History  . Occupation: Unemployed  Social Needs  . Financial resource strain: Not on file  . Food insecurity:    Worry: Not on file    Inability: Not on file  . Transportation needs:    Medical: Not on file    Non-medical: Not on  file  Tobacco Use  . Smoking status: Current Every Day Smoker    Packs/day: 1.50    Years: 40.00    Pack years: 60.00    Types: Cigarettes    Last attempt to quit: 01/20/2012    Years since quitting: 5.7  . Smokeless tobacco: Never Used  . Tobacco comment: Pt doing E-cigarette as of 01/20/12  Substance and Sexual Activity  . Alcohol use: No  . Drug use: No  . Sexual activity: Never  Lifestyle  . Physical activity:    Days per week: Not on file    Minutes per session: Not on file  . Stress: Not on file  Relationships  . Social connections:    Talks on phone: Not on file    Gets together: Not on file    Attends religious service: Not on file    Active member of club or organization: Not on file    Attends  meetings of clubs or organizations: Not on file    Relationship status: Not on file  . Intimate partner violence:    Fear of current or ex partner: Not on file    Emotionally abused: Not on file    Physically abused: Not on file    Forced sexual activity: Not on file  Other Topics Concern  . Not on file  Social History Narrative   Patient on worker's compensation for an injury sustained on the job 5 years ago.    Family History  Problem Relation Age of Onset  . Lung cancer Mother   . Dementia Father   . Atrial fibrillation Father      Review of Systems  Constitutional: Negative.  Negative for chills and fever.  HENT: Positive for congestion and sinus pain. Negative for sore throat.   Eyes: Negative.  Negative for blurred vision and double vision.  Respiratory: Negative.  Negative for cough and shortness of breath.   Cardiovascular: Negative.  Negative for chest pain and palpitations.  Gastrointestinal: Negative.  Negative for abdominal pain, diarrhea, nausea and vomiting.  Genitourinary: Negative.  Negative for dysuria and hematuria.  Musculoskeletal: Negative.  Negative for back pain, myalgias and neck pain.  Skin: Negative.  Negative for rash.  Neurological: Negative.  Negative for dizziness and headaches.  Endo/Heme/Allergies: Negative.   All other systems reviewed and are negative.   Vitals:   10/14/17 1019  BP: 134/81  Pulse: 89  Resp: 16  Temp: 98.9 F (37.2 C)  SpO2: 96%    Physical Exam  Constitutional: She is oriented to person, place, and time. She appears well-developed and well-nourished.  HENT:  Head: Normocephalic and atraumatic.  Nose: Nose normal.  Mouth/Throat: Oropharynx is clear and moist.  Eyes: Pupils are equal, round, and reactive to light. Conjunctivae and EOM are normal.  Neck: Normal range of motion. Neck supple. No thyromegaly present.  Cardiovascular: Normal rate, regular rhythm and normal heart sounds.  Pulmonary/Chest: Effort normal  and breath sounds normal.  Musculoskeletal: Normal range of motion.  Lymphadenopathy:    She has no cervical adenopathy.  Neurological: She is alert and oriented to person, place, and time. No sensory deficit. She exhibits normal muscle tone. Coordination normal.  Skin: Skin is warm and dry. Capillary refill takes less than 2 seconds. No rash noted.  Psychiatric: She has a normal mood and affect. Her behavior is normal.  Vitals reviewed.  A total of 25 minutes was spent in the room with the patient, greater than 50% of which was  in counseling/coordination of care regarding chronic medical problems, medications,and need for follow up.   ASSESSMENT & PLAN: Nailyn was seen today for nasal congestion.  Diagnoses and all orders for this visit:  Sinus congestion -     methylPREDNISolone sodium succinate (SOLU-MEDROL) 125 mg/2 mL injection 125 mg  Acute recurrent sinusitis, unspecified location -     amoxicillin-clavulanate (AUGMENTIN) 875-125 MG tablet; Take 1 tablet by mouth 2 (two) times daily for 7 days.    Patient Instructions       If you have lab work done today you will be contacted with your lab results within the next 2 weeks.  If you have not heard from [place] then please contact us. The fastest way to get your results is to register for My Chart.   IF you received an x-ray today, you will receive an invoice from [facility]. Please contact [facility] at [phone] with questions or concerns regarding your invoice.   IF you received labwork today, you will receive an invoice from [facility]. Please contact [facility] at [phone] with questions or concerns regarding your invoice.   Our billing staff will not be able to assist you with questions regarding bills from these companies.  You will be contacted with the lab results as soon as they are available. The fastest way to get your results is to activate your My Chart account. Instructions are located on the  last page of this paperwork. If you have not heard from [place] regarding the results in 2 weeks, please contact this office.     Sinusitis, Adult Sinusitis is soreness and inflammation of your sinuses. Sinuses are hollow spaces in the bones around your face. They are located:  Around your eyes.  In the middle of your forehead.  Behind your nose.  In your cheekbones.  Your sinuses and nasal passages are lined with a stringy fluid (mucus). Mucus normally drains out of your sinuses. When your nasal tissues get inflamed or swollen, the mucus can get trapped or blocked so air cannot flow through your sinuses. This lets bacteria, viruses, and funguses grow, and that leads to infection. Follow these instructions at home: Medicines  Take, use, or apply over-the-counter and prescription medicines only as told by your doctor. These may include nasal sprays.  If you were prescribed an antibiotic medicine, take it as told by your doctor. Do not stop taking the antibiotic even if you start to feel better. Hydrate and Humidify  Drink enough water to keep your pee (urine) clear or pale yellow.  Use a cool mist humidifier to keep the humidity level in your home above 50%.  Breathe in steam for 10-15 minutes, 3-4 times a day or as told by your doctor. You can do this in the bathroom while a hot shower is running.  Try not to spend time in cool or dry air. Rest  Rest as much as possible.  Sleep with your head raised (elevated).  Make sure to get enough sleep each night. General instructions  Put a warm, moist washcloth on your face 3-4 times a day or as told by your doctor. This will help with discomfort.  Wash your hands often with soap and water. If there is no soap and water, use hand sanitizer.  Do not smoke. Avoid being around people who are smoking (secondhand smoke).  Keep all follow-up visits as told by your doctor. This is important. Contact a doctor if:  You have a fever.  Your  symptoms  get worse.  Your symptoms do not get better within 10 days. Get help right away if:  You have a very bad headache.  You cannot stop throwing up (vomiting).  You have pain or swelling around your face or eyes.  You have trouble seeing.  You feel confused.  Your neck is stiff.  You have trouble breathing. This information is not intended to replace advice given to you by your health care provider. Make sure you discuss any questions you have with your health care provider. Document Released: 07/01/2007 Document Revised: 09/08/2015 Document Reviewed: 11/07/2014 Elsevier Interactive Patient Education  2018 Elsevier Inc.      Edwina Barth, MD [facility]

## 2017-10-15 ENCOUNTER — Other Ambulatory Visit: Payer: Self-pay | Admitting: Family Medicine

## 2017-10-15 NOTE — Telephone Encounter (Signed)
Pt calling to check status of refill

## 2017-10-18 NOTE — Telephone Encounter (Signed)
I have gave pt hard rxs for alprazolam and clonazepam in her OV < 1 mo ago - 09/20/17 - per the [place] CSRS she has not filled these yet - her alprazolam 0.5 #60 and her clonazepam 1mg  #30 where both last filled 07/24/17 and 07/21/17 respectively which were written on 03/12/17. At pts visit 8/26 I gave her refills on these for 2 mo supply of each which typically lasts her >6 mos total if not more and these have not been filled yet. Golden HurterSierra Roberts spoke w/ pt 10/18/17 at 5:30 pm who verified that she did NOT need refills on her klonopin and alprazolam - these requests were not generated by her, and she has the hard rxs when she does need refills.

## 2017-10-18 NOTE — Telephone Encounter (Signed)
She had a NUCLEAR MEDICINE bone scan in 04/2014 ordered by orthopedist Dr. Ethelene Halamos done at [facility] which showed some wear-and-tear degenerative arthritis. What pt needs is a different study - a DEXA bone DENSITY scan - this is a special type of xray done usually of the femur and low back that is able to assess the strength of the bone and compare it to what we would expect a young woman to have and what we would expect a woman of her age to have - this is how we diagnose osteoporosis and osteopenia and decide when people need medical treatment for this, what type, and monitor the effectiveness, as well as see how quickly the bones are degenerating vs staying stable over time. This is recommended to be done every 2 years a long with the mammogram starting at age 65 yo as is shown that diagnosis and intervention is effective in reducing hip and spinal fractures and thereby significantly decreasing morbidity and mortality. (So even if she did have a dexa bone scan done - she would still be due for another - but I don't think she did - I think she is thinking of this NUCLEAR MEDICINE bone scan that shows active bone turnover - entirely different)

## 2017-10-19 NOTE — Telephone Encounter (Signed)
Followed up with pt about medication refills, she informed me that she did not need any refills at this time and would call the office when she needed them.

## 2017-11-03 ENCOUNTER — Encounter: Payer: Self-pay | Admitting: Family Medicine

## 2017-12-09 ENCOUNTER — Ambulatory Visit (INDEPENDENT_AMBULATORY_CARE_PROVIDER_SITE_OTHER): Payer: Medicare HMO | Admitting: Emergency Medicine

## 2017-12-09 ENCOUNTER — Encounter: Payer: Self-pay | Admitting: Emergency Medicine

## 2017-12-09 ENCOUNTER — Other Ambulatory Visit: Payer: Self-pay

## 2017-12-09 VITALS — BP 165/82 | HR 88 | Temp 98.5°F | Resp 18 | Ht 67.75 in | Wt 267.0 lb

## 2017-12-09 DIAGNOSIS — J329 Chronic sinusitis, unspecified: Secondary | ICD-10-CM | POA: Diagnosis not present

## 2017-12-09 DIAGNOSIS — J309 Allergic rhinitis, unspecified: Secondary | ICD-10-CM

## 2017-12-09 MED ORDER — MONTELUKAST SODIUM 10 MG PO TABS: 10.0000 mg | ORAL_TABLET | Freq: Every day | ORAL | 3 refills | Status: DC

## 2017-12-09 MED ORDER — PREDNISONE 20 MG PO TABS: 40.0000 mg | ORAL_TABLET | Freq: Every day | ORAL | 0 refills | Status: AC

## 2017-12-09 NOTE — Patient Instructions (Addendum)
If you have lab work done today you will be contacted with your lab results within the next 2 weeks.  If you have not heard from us then please contact us. The fastest way to get your results is to register for My Chart.   IF you received an x-ray today, you will receive an invoice from [facility]. Please contact [facility] at [phone] with questions or concerns regarding your invoice.   IF you received labwork today, you will receive an invoice from [facility]. Please contact [facility] at [phone] with questions or concerns regarding your invoice.   Our billing staff will not be able to assist you with questions regarding bills from these companies.  You will be contacted with the lab results as soon as they are available. The fastest way to get your results is to activate your My Chart account. Instructions are located on the last page of this paperwork. If you have not heard from us regarding the results in 2 weeks, please contact this office.    Normal Sinusitis, Adult Sinusitis is soreness and inflammation of your sinuses. Sinuses are hollow spaces in the bones around your face. They are located:  Around your eyes.  In the middle of your forehead.  Behind your nose.  In your cheekbones.  Your sinuses and nasal passages are lined with a stringy fluid (mucus). Mucus normally drains out of your sinuses. When your nasal tissues get inflamed or swollen, the mucus can get trapped or blocked so air cannot flow through your sinuses. This lets bacteria, viruses, and funguses grow, and that leads to infection. Follow these instructions at home: Medicines  Take, use, or apply over-the-counter and prescription medicines only as told by your doctor. These may include nasal sprays.  If you were prescribed an antibiotic medicine, take it as told by your doctor. Do not stop taking the antibiotic even if you start to feel better. Hydrate and Humidify  Drink  enough water to keep your pee (urine) clear or pale yellow.  Use a cool mist humidifier to keep the humidity level in your home above 50%.  Breathe in steam for 10-15 minutes, 3-4 times a day or as told by your doctor. You can do this in the bathroom while a hot shower is running.  Try not to spend time in cool or dry air. Rest  Rest as much as possible.  Sleep with your head raised (elevated).  Make sure to get enough sleep each night. General instructions  Put a warm, moist washcloth on your face 3-4 times a day or as told by your doctor. This will help with discomfort.  Wash your hands often with soap and water. If there is no soap and water, use hand sanitizer.  Do not smoke. Avoid being around people who are smoking (secondhand smoke).  Keep all follow-up visits as told by your doctor. This is important. Contact a doctor if:  You have a fever.  Your symptoms get worse.  Your symptoms do not get better within 10 days. Get help right away if:  You have a very bad headache.  You cannot stop throwing up (vomiting).  You have pain or swelling around your face or eyes.  You have trouble seeing.  You feel confused.  Your neck is stiff.  You have trouble breathing. This information is not intended to replace advice given to you by your health care provider. Make sure you discuss any questions you have with your health  care provider. Document Released: 07/01/2007 Document Revised: 09/08/2015 Document Reviewed: 11/07/2014 Elsevier Interactive Patient Education  [employer].

## 2017-12-09 NOTE — Progress Notes (Signed)
Jasmine Long 67 y.o.   Chief Complaint  Patient presents with  . Follow-up    following up from sinus     HISTORY OF PRESENT ILLNESS: This is a 67 y.o. female with history of chronic sinus problems, has been on multiple medications, last time she saw ENT doctor was over 20 years ago.  Symptoms worse during the spring and fall.  Allergic component to it.  Denies nasal discharge.  Complaining of a constant postnasal drip that feels like a bulge in her throat.  Chronic smoker.  Also has a history of chronic anxiety since her son was murdered.  Was started on benzodiazepines by Dr. Merla Riches.  States that she takes medication as needed but she may need to increase her intake the next couple weeks because of her son's death anniversary.  Denies any other significant symptoms. Past medical history includes thyroid problem, hypertension, diabetes, and high cholesterol.  On multiple medications for these conditions.  HPI   Prior to Admission medications   Medication Sig Start Date End Date Taking? Authorizing Provider  ALPRAZolam Prudy Feeler) 0.5 MG tablet TAKE 1 TO 2 TABLETS BY MOUTH TWICE DAILY FOR ANXIETY 09/20/17  Yes Sherren Mocha, MD  atorvastatin (LIPITOR) 20 MG tablet TAKE 1 TABLET BY MOUTH DAILY 10/18/17  Yes Sherren Mocha, MD  buPROPion (WELLBUTRIN XL) 300 MG 24 hr tablet Take 1 tablet (300 mg total) by mouth daily. 09/20/17  Yes Sherren Mocha, MD  clonazePAM (KLONOPIN) 1 MG tablet TAKE 1 TABLET BY MOUTH AT BEDTIME FOR SLEEP OR ANXIETY 09/20/17  Yes Sherren Mocha, MD  levothyroxine (SYNTHROID, LEVOTHROID) 125 MCG tablet Take 1 tablet (125 mcg total) by mouth daily before breakfast. 09/20/17  Yes Sherren Mocha, MD  losartan (COZAAR) 100 MG tablet Take 1 tablet (100 mg total) by mouth daily. 09/20/17  Yes Sherren Mocha, MD  metFORMIN (GLUCOPHAGE) 1000 MG tablet TAKE 1 TABLET BY MOUTH 2 TIMES DAILY WITH MEALS 09/20/17  Yes Sherren Mocha, MD  azelastine (ASTELIN) 0.1 % nasal spray Place 2 sprays into both nostrils 2  (two) times daily. For sinus congestion from allergies Patient not taking: Reported on 10/14/2017 09/20/17   Sherren Mocha, MD  metoprolol succinate (TOPROL XL) 25 MG 24 hr tablet Take 1 tablet (25 mg total) by mouth daily. Patient not taking: Reported on 10/14/2017 09/20/17   Sherren Mocha, MD    No Known Allergies  Patient Active Problem List   Diagnosis Date Noted  . Sinus congestion 10/14/2017  . Acute recurrent sinusitis 10/14/2017  . Nonspecific chest pain 04/30/2017  . Epigastric pain 04/30/2017  . History of peptic ulcer 04/30/2017  . Hyperlipidemia 12/11/2015  . Insomnia 12/11/2015  . Panic attack as reaction to stress 12/11/2015  . Tobacco abuse 12/11/2015  . Chronic sinusitis 12/11/2015  . Diabetes (HCC) 05/15/2015  . Hypothyroid 04/29/2011  . BMI 40.0-44.9, adult (HCC) 04/29/2011  . Depression with anxiety 04/29/2011  . GERD (gastroesophageal reflux disease) 04/29/2011  . 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  . Nicotine addiction 04/29/2011  . Hypertension 04/29/2011  . Murmur, cardiac 04/29/2011    Past Medical History:  Diagnosis Date  . Allergy   . Anxiety   . Depression   . Heart murmur   . Thyroid disease   . Ulcer     Past Surgical History:  Procedure Laterality Date  . BREAST SURGERY    . CESAREAN SECTION    . CHOLECYSTECTOMY    . NASAL  SEPTUM SURGERY      Social History   Socioeconomic History  . Marital status: Divorced    Spouse name: Not on file  . Number of children: Not on file  . Years of education: Not on file  . Highest education level: Not on file  Occupational History  . Occupation: Unemployed  Social Needs  . Financial resource strain: Not on file  . Food insecurity:    Worry: Not on file    Inability: Not on file  . Transportation needs:    Medical: Not on file    Non-medical: Not on file  Tobacco Use  . Smoking status: Current Every Day Smoker    Packs/day: 1.50    Years: 40.00    Pack years: 60.00    Types: Cigarettes     Last attempt to quit: 01/20/2012    Years since quitting: 5.8  . Smokeless tobacco: Never Used  . Tobacco comment: Pt doing E-cigarette as of 01/20/12  Substance and Sexual Activity  . Alcohol use: No  . Drug use: No  . Sexual activity: Never  Lifestyle  . Physical activity:    Days per week: Not on file    Minutes per session: Not on file  . Stress: Not on file  Relationships  . Social connections:    Talks on phone: Not on file    Gets together: Not on file    Attends religious service: Not on file    Active member of club or organization: Not on file    Attends meetings of clubs or organizations: Not on file    Relationship status: Not on file  . Intimate partner violence:    Fear of current or ex partner: Not on file    Emotionally abused: Not on file    Physically abused: Not on file    Forced sexual activity: Not on file  Other Topics Concern  . Not on file  Social History Narrative   Patient on worker's compensation for an injury sustained on the job 5 years ago.    Family History  Problem Relation Age of Onset  . Lung cancer Mother   . Dementia Father   . Atrial fibrillation Father      Review of Systems  Constitutional: Negative.  Negative for chills, diaphoresis, fever and malaise/fatigue.  HENT: Positive for congestion. Negative for nosebleeds, sinus pain and sore throat.   Eyes: Negative.  Negative for blurred vision and double vision.  Respiratory: Positive for cough (Smoker's cough). Negative for hemoptysis, sputum production and shortness of breath.   Cardiovascular: Negative.  Negative for chest pain and palpitations.  Gastrointestinal: Negative.  Negative for abdominal pain, diarrhea, nausea and vomiting.  Genitourinary: Negative.  Negative for dysuria.  Musculoskeletal: Negative.  Negative for back pain, myalgias and neck pain.  Skin: Negative.  Negative for rash.  Neurological: Negative.  Negative for dizziness, sensory change, focal weakness and  headaches.  Endo/Heme/Allergies: Negative.   All other systems reviewed and are negative.   Vitals:   12/09/17 0931  BP: (!) 165/82  Pulse: 88  Resp: 18  Temp: 98.5 F (36.9 C)  SpO2: 95%    Physical Exam  Constitutional: She is oriented to person, place, and time. She appears well-developed.  Obese  HENT:  Head: Normocephalic and atraumatic.  Right Ear: External ear normal.  Left Ear: External ear normal.  Nose: Nose normal.  Mouth/Throat: Oropharynx is clear and moist.  Eyes: Pupils are equal, round, and reactive to  light. Conjunctivae and EOM are normal.  Neck: Normal range of motion. Neck supple. No thyromegaly present.  Cardiovascular: Normal rate, regular rhythm and normal heart sounds.  Pulmonary/Chest: Effort normal and breath sounds normal.  Abdominal: Soft. There is no tenderness.  Musculoskeletal: Normal range of motion.  Lymphadenopathy:    She has no cervical adenopathy.  Neurological: She is alert and oriented to person, place, and time. No sensory deficit. She exhibits normal muscle tone.  Skin: Skin is warm and dry. Capillary refill takes less than 2 seconds.  Psychiatric: She has a normal mood and affect. Her behavior is normal.  Vitals reviewed.  A total of 25 minutes was spent in the room with the patient, greater than 50% of which was in counseling/coordination of care regarding chronic medical problems, treatment, medications, and need to follow-up with ENT.   ASSESSMENT & PLAN: Jasmine Long was seen today for follow-up.  Diagnoses and all orders for this visit:  Chronic sinusitis, unspecified location -     Ambulatory referral to ENT -     predniSONE (DELTASONE) 20 MG tablet; Take 2 tablets (40 mg total) by mouth daily with breakfast for 5 days. -     montelukast (SINGULAIR) 10 MG tablet; Take 1 tablet (10 mg total) by mouth at bedtime.  Allergic sinusitis -     Ambulatory referral to ENT -     predniSONE (DELTASONE) 20 MG tablet; Take 2 tablets (40 mg  total) by mouth daily with breakfast for 5 days. -     montelukast (SINGULAIR) 10 MG tablet; Take 1 tablet (10 mg total) by mouth at bedtime.    Patient Instructions       If you have lab work done today you will be contacted with your lab results within the next 2 weeks.  If you have not heard from [place] then please contact us. The fastest way to get your results is to register for My Chart.   IF you received an x-ray today, you will receive an invoice from [facility]. Please contact [facility] at [phone] with questions or concerns regarding your invoice.   IF you received labwork today, you will receive an invoice from [facility]. Please contact [facility] at [phone] with questions or concerns regarding your invoice.   Our billing staff will not be able to assist you with questions regarding bills from these companies.  You will be contacted with the lab results as soon as they are available. The fastest way to get your results is to activate your My Chart account. Instructions are located on the last page of this paperwork. If you have not heard from [place] regarding the results in 2 weeks, please contact this office.    Normal Sinusitis, Adult Sinusitis is soreness and inflammation of your sinuses. Sinuses are hollow spaces in the bones around your face. They are located:  Around your eyes.  In the middle of your forehead.  Behind your nose.  In your cheekbones.  Your sinuses and nasal passages are lined with a stringy fluid (mucus). Mucus normally drains out of your sinuses. When your nasal tissues get inflamed or swollen, the mucus can get trapped or blocked so air cannot flow through your sinuses. This lets bacteria, viruses, and funguses grow, and that leads to infection. Follow these instructions at home: Medicines  Take, use, or apply over-the-counter and prescription medicines only as told by your doctor. These may include nasal sprays.  If you  were prescribed an antibiotic medicine, take  it as told by your doctor. Do not stop taking the antibiotic even if you start to feel better. Hydrate and Humidify  Drink enough water to keep your pee (urine) clear or pale yellow.  Use a cool mist humidifier to keep the humidity level in your home above 50%.  Breathe in steam for 10-15 minutes, 3-4 times a day or as told by your doctor. You can do this in the bathroom while a hot shower is running.  Try not to spend time in cool or dry air. Rest  Rest as much as possible.  Sleep with your head raised (elevated).  Make sure to get enough sleep each night. General instructions  Put a warm, moist washcloth on your face 3-4 times a day or as told by your doctor. This will help with discomfort.  Wash your hands often with soap and water. If there is no soap and water, use hand sanitizer.  Do not smoke. Avoid being around people who are smoking (secondhand smoke).  Keep all follow-up visits as told by your doctor. This is important. Contact a doctor if:  You have a fever.  Your symptoms get worse.  Your symptoms do not get better within 10 days. Get help right away if:  You have a very bad headache.  You cannot stop throwing up (vomiting).  You have pain or swelling around your face or eyes.  You have trouble seeing.  You feel confused.  Your neck is stiff.  You have trouble breathing. This information is not intended to replace advice given to you by your health care provider. Make sure you discuss any questions you have with your health care provider. Document Released: 07/01/2007 Document Revised: 09/08/2015 Document Reviewed: 11/07/2014 Elsevier Interactive Patient Education  2018 Elsevier Inc.      Edwina Barth, MD [facility]

## 2018-01-20 ENCOUNTER — Other Ambulatory Visit: Payer: Self-pay | Admitting: Pharmacist

## 2018-01-20 NOTE — Patient Outreach (Signed)
[facility] Care Management  01/20/2018  Jasmine Long 02/27/1950 [id number]   Patient was called regarding medication adherence. HIPAA identifiers were obtained. Patient confirmed that she is still taking Atorvastatin.  She said she will call [facility] today and get it refilled today.   Beecher McardleKatina J. Jacquetta Polhamus, PharmD, BCACP [facility] Clinical Pharmacist [phone]

## 2018-02-04 ENCOUNTER — Other Ambulatory Visit: Payer: Self-pay | Admitting: Family Medicine

## 2018-02-04 NOTE — Telephone Encounter (Signed)
Requested medication (s) are due for refill today: es  Requested medication (s) are on the active medication list: yes  Last refill:  10/18/17  Future visit scheduled: no  Notes to clinic:  Not delegated    Requested Prescriptions  Pending Prescriptions Disp Refills   ALPRAZolam (XANAX) 0.5 MG tablet [Pharmacy Med Name: ALPRAZolam Oral Tablet 0.5 MG] 60 tablet 0    Sig: TAKE 1 TO 2 TABLETS BY MOUTH TWICE DAILY FOR ANXIETY     Not Delegated - Psychiatry:  Anxiolytics/Hypnotics Failed - 02/04/2018 11:49 AM      Failed - This refill cannot be delegated      Failed - Urine Drug Screen completed in last 360 days.      Passed - Valid encounter within last 6 months    Recent Outpatient Visits          1 month ago Chronic sinusitis, unspecified location   Primary Care at [facility], [place], [place]   3 months ago Sinus congestion   Primary Care at [facility], [place], [place]   4 months ago Type 2 diabetes mellitus without complication, without long-term current use of insulin [facility])   [facility] Care at [facility], [place], MD   7 months ago Type 2 diabetes mellitus without complication, without long-term current use of insulin [facility])   Primary Care at [facility], [place] July, MD   9 months ago Nonspecific chest pain   Primary Care at [facility], Eilleen Kempf, MD

## 2018-02-04 NOTE — Telephone Encounter (Signed)
Copied from CRM [id number]. Topic: General - Other >> Feb 04, 2018 11:54 AM [name] wrote: Reason for CRM:  patient is calling to state she has sent a medication refill request for ALPRAZolam (XANAX) 0.5 MG tablet. She stated she has 12 left and she is calling to give the reason as to why she needs this medication. The patient stated she take 2 daily as needed around this time of the year due to her son's death anniversary 04-01-2022. Pt stated it is a hard time for her and this medication helps. She is requesting a call back from the office to speak to Dr. Alvy Bimler  or his nurse. Please advise    Best contact number [phone]

## 2018-02-07 NOTE — Telephone Encounter (Signed)
Patient is requesting a refill of the following medications: Requested Prescriptions   Pending Prescriptions Disp Refills  . ALPRAZolam (XANAX) 0.5 MG tablet [Pharmacy Med Name: ALPRAZolam Oral Tablet 0.5 MG] 60 tablet 0    Sig: TAKE 1 TO 2 TABLETS BY MOUTH TWICE DAILY FOR ANXIETY.    Date of patient request: 02/04/2018 Last office visit: 09/20/17 Date of last refill: 09/20/17 Last refill amount:#60 Follow up time period per chart: n/a  Please advise. Dgaddy, CMA

## 2018-02-21 DIAGNOSIS — R05 Cough: Secondary | ICD-10-CM | POA: Diagnosis not present

## 2018-02-21 DIAGNOSIS — J320 Chronic maxillary sinusitis: Secondary | ICD-10-CM | POA: Diagnosis not present

## 2018-02-21 DIAGNOSIS — J301 Allergic rhinitis due to pollen: Secondary | ICD-10-CM | POA: Diagnosis not present

## 2018-02-21 DIAGNOSIS — J370 Chronic laryngitis: Secondary | ICD-10-CM | POA: Diagnosis not present

## 2018-02-21 DIAGNOSIS — J322 Chronic ethmoidal sinusitis: Secondary | ICD-10-CM | POA: Diagnosis not present

## 2018-02-24 ENCOUNTER — Telehealth: Payer: Self-pay | Admitting: Emergency Medicine

## 2018-02-24 NOTE — Telephone Encounter (Signed)
Copied from CRM [id number]. Topic: Quick Communication - Rx Refill/Question >> Feb 24, 2018  9:12 AM Jasmine Long wrote: Medication: metFORMIN (GLUCOPHAGE) 1000 MG tablet [[id number]]  pt called and stated that she recently read an article from a new station stating that this medication causes cancer. Pt would like this medication changed immediately. Please advise

## 2018-02-24 NOTE — Telephone Encounter (Signed)
pls advise

## 2018-03-01 DIAGNOSIS — J382 Nodules of vocal cords: Secondary | ICD-10-CM | POA: Diagnosis not present

## 2018-03-01 DIAGNOSIS — J370 Chronic laryngitis: Secondary | ICD-10-CM | POA: Diagnosis not present

## 2018-03-02 NOTE — Telephone Encounter (Signed)
Due to recall of other meds over the past several years - like some of the overseas generic manufacturers of losartan, the FDA is TESTING metformin, along with numerous other meds, to ensure they aren't contaminated with a chemical that is commonly found in food and water but has been linked to possible increased cancer risk when taken in VERY high volumes (no known cases in humans - just cases in rats when given doses FAR greater than what a pt would receive when taking recommended med doses).   However, there have NOT been any findings to date or reports and no metformin recalls.  In fact, prior studies have shown that metformin is most likely to DECREASE cancer risk - so it is not the drug - just that the different types of generic pills from all different manufacturers are being tested for a possible contaminant, the one that was found in some types of losartan last year (so those types are recalled - the losartan that she has been getting has been shown to be free of any cancer causing contaminant).  If any drug is found to be contaminated, her pharmacy will notify her.  If she has any further concerns, rec she discuss it with her pharmacist who will be able to provide her the most up-to-date information.  If she still wants metformin switched, rec she come in for OV for this so can discuss risks/benefits/side effects/cost of other options and choose the best one for her.

## 2018-03-04 ENCOUNTER — Telehealth: Payer: Self-pay | Admitting: Emergency Medicine

## 2018-03-04 NOTE — Telephone Encounter (Signed)
Copied from CRM [id number]. Topic: General - Other >> Mar 04, 2018  2:21 PM Leafy Ro wrote: reason for CRM: pt would like to know if dr sagardia would call her in new rx nicotine patches. [facility] on [address] Merla Riches prescribed years ago. Pt will make an appt if md recommend. Pt is ready to quit smoking

## 2018-03-07 ENCOUNTER — Other Ambulatory Visit: Payer: Self-pay

## 2018-03-07 DIAGNOSIS — Z720 Tobacco use: Secondary | ICD-10-CM

## 2018-03-07 MED ORDER — NICOTINE 21 MG/24HR TD PT24: 21.0000 mg | MEDICATED_PATCH | Freq: Every day | TRANSDERMAL | 0 refills | Status: DC

## 2018-03-07 NOTE — Telephone Encounter (Signed)
OK to refill nicotine patches. Thanks.

## 2018-03-07 NOTE — Telephone Encounter (Signed)
Spoke with pt about medication concerns, she verbalized understanding and informed me that she is still currently taking the Metformin at this time.

## 2018-03-07 NOTE — Telephone Encounter (Signed)
RX has been sent.

## 2018-03-07 NOTE — Telephone Encounter (Signed)
Please advise 

## 2018-03-16 DIAGNOSIS — J383 Other diseases of vocal cords: Secondary | ICD-10-CM | POA: Diagnosis not present

## 2018-03-16 DIAGNOSIS — F458 Other somatoform disorders: Secondary | ICD-10-CM | POA: Diagnosis not present

## 2018-03-16 DIAGNOSIS — J384 Edema of larynx: Secondary | ICD-10-CM | POA: Diagnosis not present

## 2018-03-16 DIAGNOSIS — R490 Dysphonia: Secondary | ICD-10-CM | POA: Diagnosis not present

## 2018-03-16 DIAGNOSIS — J387 Other diseases of larynx: Secondary | ICD-10-CM | POA: Diagnosis not present

## 2018-03-16 DIAGNOSIS — F17210 Nicotine dependence, cigarettes, uncomplicated: Secondary | ICD-10-CM | POA: Diagnosis not present

## 2018-03-21 DIAGNOSIS — J320 Chronic maxillary sinusitis: Secondary | ICD-10-CM | POA: Diagnosis not present

## 2018-03-21 DIAGNOSIS — J040 Acute laryngitis: Secondary | ICD-10-CM | POA: Diagnosis not present

## 2018-03-21 DIAGNOSIS — J322 Chronic ethmoidal sinusitis: Secondary | ICD-10-CM | POA: Diagnosis not present

## 2018-03-31 ENCOUNTER — Ambulatory Visit: Payer: Medicare HMO | Admitting: Family Medicine

## 2018-03-31 ENCOUNTER — Encounter: Payer: Medicare HMO | Admitting: Family Medicine

## 2018-03-31 ENCOUNTER — Telehealth: Payer: Self-pay | Admitting: Emergency Medicine

## 2018-03-31 NOTE — Telephone Encounter (Signed)
Pt dropped off handicapped  parking  Placard that needs to  be filled out FR put in poviders box for review and signature FR

## 2018-04-01 ENCOUNTER — Telehealth: Payer: Self-pay | Admitting: *Deleted

## 2018-04-01 NOTE — Telephone Encounter (Signed)
Patient was notified today message left in voice mail handicap placard is ready for pick up at the front desk.

## 2018-04-01 NOTE — Telephone Encounter (Signed)
Left message in voice mail the parking placard is ready for pick up at the front desk. A copy has been made for the patient's chart. The office close at 5:30 pm.

## 2018-04-11 ENCOUNTER — Telehealth: Payer: Self-pay | Admitting: Emergency Medicine

## 2018-04-11 NOTE — Telephone Encounter (Signed)
Copied from CRM [id number]. Topic: Quick Communication - Rx Refill/Question >> Apr 11, 2018  9:54 AM Wyonia Hough E wrote: Medication:atorvastatin (LIPITOR) 20 MG tablet metFORMIN (GLUCOPHAGE) 1000 MG tablet losartan (COZAAR) 100 MG tablet  levothyroxine (SYNTHROID, LEVOTHROID) 125 MCG tablet buPROPion (WELLBUTRIN XL) 300 MG 24 hr tablet clonazePAM (KLONOPIN) 1 MG tablet - takes as needed   ALPRAZolam (XANAX) 0.5 MG tablet - takes as needed - have some left  Pt is not out of meds or due for Rx refill yet but just wants meds called in for when she needs them/Pt is staying home thru out the virus scare and wants the meds called into [facility] so that she can pick up at the drive thru window when ready instead of her having to walk thru [facility]/ please advise     Has the patient contacted their pharmacy? No   Preferred Pharmacy (with phone number or street name): [id number] [place], [place] - [address] [phone] (Phone) [phone] (Fax)    Agent: Please be advised that RX refills may take up to 3 business days. We ask that you follow-up with your pharmacy.

## 2018-04-11 NOTE — Telephone Encounter (Signed)
Copied from CRM #[id number]. Topic: Quick Communication - Rx Refill/Question °>> Apr 11, 2018  9:54 AM Johnson, Chaz E wrote: °Medication:atorvastatin (LIPITOR) 20 MG tablet °metFORMIN (GLUCOPHAGE) 1000 MG tablet °losartan (COZAAR) 100 MG tablet  °levothyroxine (SYNTHROID, LEVOTHROID) 125 MCG tablet °buPROPion (WELLBUTRIN XL) 300 MG 24 hr tablet °clonazePAM (KLONOPIN) 1 MG tablet - takes as needed  ° ALPRAZolam (XANAX) 0.5 MG tablet - takes as needed - have some left  °Pt is not out of meds or due for Rx refill yet but just wants meds called in for when she needs them/Pt is staying home thru out the virus scare and [facility] so that she can pick up at the drive thru window when ready instead of her having to walk thru [facility]/ please advise  ° ° ° °Has the patient contacted their pharmacy? No  ° °Preferred Pharmacy (with phone number or street name): [id number] - [place], [place] - [address] [phone] (Phone) °[phone] (Fax) ° ° ° °Agent: Please be advised that RX refills may take up to 3 business days. We ask that you follow-up with your pharmacy. °

## 2018-04-11 NOTE — Telephone Encounter (Unsigned)
Copied from CRM [id number]. Topic: Quick Communication - Rx Refill/Question >> Apr 11, 2018  9:54 AM Wyonia Hough E wrote: Medication:atorvastatin (LIPITOR) 20 MG tablet metFORMIN (GLUCOPHAGE) 1000 MG tablet losartan (COZAAR) 100 MG tablet  levothyroxine (SYNTHROID, LEVOTHROID) 125 MCG tablet buPROPion (WELLBUTRIN XL) 300 MG 24 hr tablet clonazePAM (KLONOPIN) 1 MG tablet - takes as needed   ALPRAZolam (XANAX) 0.5 MG tablet - takes as needed - have some left  Pt is not out of meds or due for Rx refill yet but just wants meds called in for when she needs them/Pt is staying home thru out the virus scare and [facility] so that she can pick up at the drive thru window when ready instead of her having to walk thru [facility]/ please advise     Has the patient contacted their pharmacy? {yes [id number]} (Agent: If no, request that the patient contact the pharmacy for the refill.) (Agent: If yes, when and what did the pharmacy advise?)  Preferred Pharmacy (with phone number or street name): ***  Agent: Please be advised that RX refills may take up to 3 business days. We ask that you follow-up with your pharmacy.

## 2018-04-14 NOTE — Telephone Encounter (Signed)
Pt checking status on her med refills.

## 2018-04-15 NOTE — Telephone Encounter (Signed)
Please advise 

## 2018-04-16 ENCOUNTER — Other Ambulatory Visit: Payer: Self-pay

## 2018-04-16 ENCOUNTER — Emergency Department (HOSPITAL_BASED_OUTPATIENT_CLINIC_OR_DEPARTMENT_OTHER)
Admission: EM | Admit: 2018-04-16 | Discharge: 2018-04-16 | Disposition: A | Discharge status: Home / Self Care | Payer: Medicare HMO | Attending: Emergency Medicine | Admitting: Emergency Medicine

## 2018-04-16 ENCOUNTER — Encounter (HOSPITAL_BASED_OUTPATIENT_CLINIC_OR_DEPARTMENT_OTHER): Payer: Self-pay | Admitting: *Deleted

## 2018-04-16 DIAGNOSIS — I10 Essential (primary) hypertension: Secondary | ICD-10-CM | POA: Insufficient documentation

## 2018-04-16 DIAGNOSIS — Z7984 Long term (current) use of oral hypoglycemic drugs: Secondary | ICD-10-CM | POA: Insufficient documentation

## 2018-04-16 DIAGNOSIS — L259 Unspecified contact dermatitis, unspecified cause: Secondary | ICD-10-CM | POA: Diagnosis not present

## 2018-04-16 DIAGNOSIS — E039 Hypothyroidism, unspecified: Secondary | ICD-10-CM | POA: Insufficient documentation

## 2018-04-16 DIAGNOSIS — Z79899 Other long term (current) drug therapy: Secondary | ICD-10-CM | POA: Insufficient documentation

## 2018-04-16 DIAGNOSIS — R011 Cardiac murmur, unspecified: Secondary | ICD-10-CM | POA: Diagnosis not present

## 2018-04-16 DIAGNOSIS — R21 Rash and other nonspecific skin eruption: Secondary | ICD-10-CM | POA: Diagnosis present

## 2018-04-16 DIAGNOSIS — F17210 Nicotine dependence, cigarettes, uncomplicated: Secondary | ICD-10-CM | POA: Diagnosis not present

## 2018-04-16 DIAGNOSIS — E119 Type 2 diabetes mellitus without complications: Secondary | ICD-10-CM | POA: Diagnosis not present

## 2018-04-16 HISTORY — DX: Essential (primary) hypertension: I10

## 2018-04-16 MED ORDER — PREDNISONE 10 MG PO TABS: ORAL_TABLET | ORAL | 0 refills | Status: DC

## 2018-04-16 MED ORDER — CEPHALEXIN 500 MG PO CAPS: 500.0000 mg | ORAL_CAPSULE | Freq: Four times a day (QID) | ORAL | 0 refills | Status: DC

## 2018-04-16 MED ORDER — HYDROXYZINE HCL 25 MG PO TABS: 25.0000 mg | ORAL_TABLET | Freq: Four times a day (QID) | ORAL | 0 refills | Status: DC | PRN

## 2018-04-16 NOTE — ED Notes (Signed)
ED Provider at bedside. 

## 2018-04-16 NOTE — ED Provider Notes (Signed)
[facility] EMERGENCY DEPARTMENT Provider Note   CSN: [id number] Arrival date & time: 04/16/18  2117    History   Chief Complaint Chief Complaint  Patient presents with  . Rash    HPI Jasmine Long is a 68 y.o. female.     The history is provided by the patient. No language interpreter was used.  Rash   Jasmine Long is a 68 y.o. female who presents to the Emergency Department complaining of facial rash. She complains of a itchy rash to the left side of her face. The rash began about three days ago and was initially just below the left thigh. Since that time it is progressively worsened and now is around her left eye and spreading from her left cheek to her left temple. She also has a small area on the right eyebrow. The rash feels hot and itchy. She denies any fevers, chest pain, nausea, vomiting. She has recently been treated for a sinus infection with three days of azithromycin. She did complete treatment over a week ago. No known contacts with creams or plans to her face. No prior similar symptoms. Past Medical History:  Diagnosis Date  . Allergy   . Anxiety   . Depression   . Heart murmur   . Hypertension   . Thyroid disease   . Ulcer     Patient Active Problem List   Diagnosis Date Noted  . Sinus congestion 10/14/2017  . Acute recurrent sinusitis 10/14/2017  . Nonspecific chest pain 04/30/2017  . Epigastric pain 04/30/2017  . History of peptic ulcer 04/30/2017  . Hyperlipidemia 12/11/2015  . Insomnia 12/11/2015  . Panic attack as reaction to stress 12/11/2015  . Tobacco abuse 12/11/2015  . Chronic sinusitis 12/11/2015  . Diabetes (HCC) 05/15/2015  . Hypothyroid 04/29/2011  . BMI 40.0-44.9, adult (HCC) 04/29/2011  . Depression with anxiety 04/29/2011  . GERD (gastroesophageal reflux disease) 04/29/2011  . 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  . Nicotine addiction 04/29/2011  . Hypertension 04/29/2011  . Murmur, cardiac 04/29/2011    Past Surgical History:   Procedure Laterality Date  . BREAST SURGERY    . CESAREAN SECTION    . CHOLECYSTECTOMY    . NASAL SEPTUM SURGERY       OB History   No obstetric history on file.      Home Medications    Prior to Admission medications   Medication Sig Start Date End Date Taking? Authorizing Provider  ALPRAZolam (XANAX) 0.5 MG tablet TAKE 1 TO 2 TABLETS BY MOUTH TWICE DAILY FOR ANXIETY. 02/07/18   Myles Lipps, MD  atorvastatin (LIPITOR) 20 MG tablet TAKE 1 TABLET BY MOUTH DAILY 10/18/17   Sherren Mocha, MD  azelastine (ASTELIN) 0.1 % nasal spray Place 2 sprays into both nostrils 2 (two) times daily. For sinus congestion from allergies Patient not taking: Reported on 10/14/2017 09/20/17   Sherren Mocha, MD  buPROPion (WELLBUTRIN XL) 300 MG 24 hr tablet Take 1 tablet (300 mg total) by mouth daily. 09/20/17   Sherren Mocha, MD  cephALEXin (KEFLEX) 500 MG capsule Take 1 capsule (500 mg total) by mouth 4 (four) times daily. 04/16/18   Tilden Fossa, MD  clonazePAM (KLONOPIN) 1 MG tablet TAKE 1 TABLET BY MOUTH AT BEDTIME FOR SLEEP OR ANXIETY 09/20/17   Sherren Mocha, MD  hydrOXYzine (ATARAX/VISTARIL) 25 MG tablet Take 1 tablet (25 mg total) by mouth every 6 (six) hours as needed. 04/16/18   Tilden Fossa, MD  levothyroxine (SYNTHROID, LEVOTHROID) 125 MCG tablet Take 1 tablet (125 mcg total) by mouth daily before breakfast. 09/20/17   Sherren Mocha, MD  losartan (COZAAR) 100 MG tablet Take 1 tablet (100 mg total) by mouth daily. 09/20/17   Sherren Mocha, MD  metFORMIN (GLUCOPHAGE) 1000 MG tablet TAKE 1 TABLET BY MOUTH 2 TIMES DAILY WITH MEALS 09/20/17   Sherren Mocha, MD  metoprolol succinate (TOPROL XL) 25 MG 24 hr tablet Take 1 tablet (25 mg total) by mouth daily. Patient not taking: Reported on 10/14/2017 09/20/17   Sherren Mocha, MD  montelukast (SINGULAIR) 10 MG tablet Take 1 tablet (10 mg total) by mouth at bedtime. 12/09/17   [name], MD  nicotine (NICODERM CQ - DOSED IN MG/24 HOURS) 21 mg/24hr patch Place  1 patch (21 mg total) onto the skin daily. 03/07/18   [name], MD  predniSONE (DELTASONE) 10 MG tablet Take 3 tablets (30 mg total) by mouth daily for 4 days, THEN 2 tablets (20 mg total) daily for 4 days, THEN 1 tablet (10 mg total) daily for 4 days. 04/16/18 04/28/18  Tilden Fossa, MD    Family History Family History  Problem Relation Age of Onset  . Lung cancer Mother   . Dementia Father   . Atrial fibrillation Father     Social History Social History   Tobacco Use  . Smoking status: Current Every Day Smoker    Packs/day: 1.50    Years: 40.00    Pack years: 60.00    Types: Cigarettes    Last attempt to quit: 01/20/2012    Years since quitting: 6.2  . Smokeless tobacco: Never Used  . Tobacco comment: Pt doing E-cigarette as of 01/20/12  Substance Use Topics  . Alcohol use: No  . Drug use: No     Allergies   Patient has no known allergies.   Review of Systems Review of Systems  Skin: Positive for rash.  All other systems reviewed and are negative.    Physical Exam Updated Vital Signs BP (!) 159/81 (BP Location: Left Arm)   Pulse (!) 102   Temp 98.5 F (36.9 C) (Oral)   Resp 16   Ht 5\' 9"  (1.753 m)   Wt 113.4 kg   SpO2 100%   BMI 36.92 kg/m   Physical Exam Vitals signs and nursing note reviewed.  Constitutional:      Appearance: She is well-developed.  HENT:     Head: Normocephalic and atraumatic.     Comments: TMs clear bilaterally. There is an erythematous rash to the left cheek, left periorbital region extending to the temple. There are fine patches of coalescing vesicles. Neck:     Musculoskeletal: Neck supple.  Cardiovascular:     Rate and Rhythm: Normal rate and regular rhythm.     Heart sounds: Murmur present.  Pulmonary:     Effort: Pulmonary effort is normal. No respiratory distress.     Breath sounds: Normal breath sounds.  Abdominal:     Palpations: Abdomen is soft.     Tenderness: There is no abdominal tenderness. There  is no guarding or rebound.  Musculoskeletal:        General: No tenderness.  Skin:    General: Skin is warm and dry.  Neurological:     Mental Status: She is alert and oriented to person, place, and time.  Psychiatric:        Behavior: Behavior normal.      ED  Treatments / Results  Labs (all labs ordered are listed, but only abnormal results are displayed) Labs Reviewed - No data to display  EKG None  Radiology No results found.  Procedures Procedures (including critical care time)  Medications Ordered in ED Medications - No data to display   Initial Impression / Assessment and Plan / ED Course  I have reviewed the triage vital signs and the nursing notes.  Pertinent labs & imaging results that were available during my care of the patient were reviewed by me and considered in my medical decision making (see chart for details).       Patient here for evaluation of itchy rash to her face, recently treated with antibiotics for a sinus infection. She is non-toxic appearing on examination. Rash is likely contact dermatitis but she has no known exposures. Feel erysipelas is unlikely but given her recent sinus infection will treat with antibiotics as well. Discussed with patient home care for contact dermatitis. Discussed outpatient follow-up and return precautions.  Final Clinical Impressions(s) / ED Diagnoses   Final diagnoses:  Contact dermatitis, unspecified contact dermatitis type, unspecified trigger    ED Discharge Orders         Ordered    predniSONE (DELTASONE) 10 MG tablet     04/16/18 2213    hydrOXYzine (ATARAX/VISTARIL) 25 MG tablet  Every 6 hours PRN     04/16/18 2213    cephALEXin (KEFLEX) 500 MG capsule  4 times daily     04/16/18 2213           Tilden Fossa, MD 04/16/18 2217

## 2018-04-16 NOTE — ED Notes (Signed)
Pt requested water. RN informed patient that she will need to wait until she is evaluated by provider. She also asked if she needed put gloves on. RN informed her that she needed not put gloves on and needs to save the supplies that we have for the staff when needed.

## 2018-04-16 NOTE — ED Notes (Signed)
Pt understood dc material. NAD noted. Scripts given at dc. All questions answered to satisfaction. Pt escorted to check out window 

## 2018-04-16 NOTE — ED Triage Notes (Signed)
Itchy, hot facial rash x 3 days

## 2018-04-19 NOTE — Telephone Encounter (Signed)
[place],   If the patient is following up on the status of her med refills, please follow up on her request.    Thank you,  Jasmine Long

## 2018-04-20 ENCOUNTER — Other Ambulatory Visit: Payer: Self-pay

## 2018-04-20 ENCOUNTER — Encounter: Payer: Self-pay | Admitting: Emergency Medicine

## 2018-04-20 ENCOUNTER — Ambulatory Visit (INDEPENDENT_AMBULATORY_CARE_PROVIDER_SITE_OTHER): Payer: Medicare HMO | Admitting: Emergency Medicine

## 2018-04-20 VITALS — BP 160/78 | HR 94 | Temp 98.8°F | Resp 16 | Wt 268.2 lb

## 2018-04-20 DIAGNOSIS — E119 Type 2 diabetes mellitus without complications: Secondary | ICD-10-CM

## 2018-04-20 DIAGNOSIS — F17210 Nicotine dependence, cigarettes, uncomplicated: Secondary | ICD-10-CM

## 2018-04-20 DIAGNOSIS — E079 Disorder of thyroid, unspecified: Secondary | ICD-10-CM

## 2018-04-20 DIAGNOSIS — Z720 Tobacco use: Secondary | ICD-10-CM

## 2018-04-20 DIAGNOSIS — E785 Hyperlipidemia, unspecified: Secondary | ICD-10-CM | POA: Diagnosis not present

## 2018-04-20 DIAGNOSIS — F418 Other specified anxiety disorders: Secondary | ICD-10-CM | POA: Diagnosis not present

## 2018-04-20 DIAGNOSIS — Z6841 Body Mass Index (BMI) 40.0 and over, adult: Secondary | ICD-10-CM | POA: Diagnosis not present

## 2018-04-20 DIAGNOSIS — R21 Rash and other nonspecific skin eruption: Secondary | ICD-10-CM | POA: Diagnosis not present

## 2018-04-20 DIAGNOSIS — I10 Essential (primary) hypertension: Secondary | ICD-10-CM | POA: Diagnosis not present

## 2018-04-20 DIAGNOSIS — T7840XA Allergy, unspecified, initial encounter: Secondary | ICD-10-CM

## 2018-04-20 MED ORDER — BUPROPION HCL ER (XL) 300 MG PO TB24: 300.0000 mg | ORAL_TABLET | Freq: Every day | ORAL | 1 refills | Status: DC

## 2018-04-20 MED ORDER — LOSARTAN POTASSIUM 100 MG PO TABS: 100.0000 mg | ORAL_TABLET | Freq: Every day | ORAL | 1 refills | Status: DC

## 2018-04-20 MED ORDER — LEVOTHYROXINE SODIUM 125 MCG PO TABS: 125.0000 ug | ORAL_TABLET | Freq: Every day | ORAL | 1 refills | Status: DC

## 2018-04-20 MED ORDER — PREDNISONE 20 MG PO TABS: 40.0000 mg | ORAL_TABLET | Freq: Every day | ORAL | 0 refills | Status: AC

## 2018-04-20 MED ORDER — NICOTINE 21 MG/24HR TD PT24: 21.0000 mg | MEDICATED_PATCH | Freq: Every day | TRANSDERMAL | 3 refills | Status: DC

## 2018-04-20 MED ORDER — DIPHENHYDRAMINE-ZINC ACETATE 2-0.1 % EX CREA: 1.0000 "application " | TOPICAL_CREAM | Freq: Three times a day (TID) | CUTANEOUS | 2 refills | Status: DC | PRN

## 2018-04-20 MED ORDER — ALPRAZOLAM 0.5 MG PO TABS: 0.5000 mg | ORAL_TABLET | Freq: Every day | ORAL | 0 refills | Status: DC | PRN

## 2018-04-20 MED ORDER — DOXYCYCLINE HYCLATE 100 MG PO TABS: 100.0000 mg | ORAL_TABLET | Freq: Two times a day (BID) | ORAL | 0 refills | Status: AC

## 2018-04-20 MED ORDER — ATORVASTATIN CALCIUM 20 MG PO TABS: 20.0000 mg | ORAL_TABLET | Freq: Every day | ORAL | 3 refills | Status: DC

## 2018-04-20 MED ORDER — METFORMIN HCL 1000 MG PO TABS: ORAL_TABLET | ORAL | 3 refills | Status: DC

## 2018-04-20 MED ORDER — METOPROLOL SUCCINATE ER 25 MG PO TB24: 25.0000 mg | ORAL_TABLET | Freq: Every day | ORAL | 3 refills | Status: DC

## 2018-04-20 NOTE — Progress Notes (Signed)
Lab Results  Component Value Date   HGBA1C 6.5 (A) 09/20/2017   BP Readings from Last 3 Encounters:  04/20/18 (!) 160/78  04/16/18 (!) 159/81  12/09/17 (!) 165/82   Wt Readings from Last 3 Encounters:  04/20/18 268 lb 3.2 oz (121.7 kg)  04/16/18 250 lb (113.4 kg)  12/09/17 267 lb (121.1 kg)   Lab Results  Component Value Date   CHOL 131 06/28/2017   HDL 50 06/28/2017   LDLCALC 69 06/28/2017   TRIG 59 06/28/2017   CHOLHDL 2.6 06/28/2017   Jasmine Long 68 y.o.   Chief Complaint  Patient presents with  . Rash    on face x 1 week    HISTORY OF PRESENT ILLNESS: This is a 68 y.o. female complaining of facial rash for 1 week.  Went to urgent care center last Saturday and was started on prednisone, cephalexin, and hydroxyzine for itching.  Felt to have allergic reaction with secondary bacterial infection.  Feels 75% better today but still complaining of residual rash with mild swelling to left side of her face and itching around her ears.  No other significant symptoms.  HPI   Prior to Admission medications   Medication Sig Start Date End Date Taking? Authorizing Provider  ALPRAZolam Prudy Feeler) 0.5 MG tablet Take 1 tablet (0.5 mg total) by mouth daily as needed for anxiety. 04/20/18  Yes Lyric Rossano, Eilleen Kempf, MD  atorvastatin (LIPITOR) 20 MG tablet Take 1 tablet (20 mg total) by mouth daily. 04/20/18 07/19/18 Yes Vannie Hochstetler, Eilleen Kempf, MD  buPROPion (WELLBUTRIN XL) 300 MG 24 hr tablet Take 1 tablet (300 mg total) by mouth daily. 04/20/18  Yes Eean Buss, Eilleen Kempf, MD  clonazePAM (KLONOPIN) 1 MG tablet TAKE 1 TABLET BY MOUTH AT BEDTIME FOR SLEEP OR ANXIETY 09/20/17  Yes Sherren Mocha, MD  hydrOXYzine (ATARAX/VISTARIL) 25 MG tablet Take 1 tablet (25 mg total) by mouth every 6 (six) hours as needed. 04/16/18  Yes Tilden Fossa, MD  levothyroxine (SYNTHROID, LEVOTHROID) 125 MCG tablet Take 1 tablet (125 mcg total) by mouth daily before breakfast. 04/20/18  Yes Natarsha Hurwitz, Eilleen Kempf, MD   losartan (COZAAR) 100 MG tablet Take 1 tablet (100 mg total) by mouth daily. 04/20/18  Yes Deloma Spindle, Eilleen Kempf, MD  metFORMIN (GLUCOPHAGE) 1000 MG tablet TAKE 1 TABLET BY MOUTH 2 TIMES DAILY WITH MEALS 04/20/18  Yes Donavin Audino, Eilleen Kempf, MD  metoprolol succinate (TOPROL XL) 25 MG 24 hr tablet Take 1 tablet (25 mg total) by mouth daily. 04/20/18  Yes Amadu Schlageter, Eilleen Kempf, MD  nicotine (NICODERM CQ - DOSED IN MG/24 HOURS) 21 mg/24hr patch Place 1 patch (21 mg total) onto the skin daily. 04/20/18  Yes Daliah Chaudoin, Eilleen Kempf, MD  azelastine (ASTELIN) 0.1 % nasal spray Place 2 sprays into both nostrils 2 (two) times daily. For sinus congestion from allergies Patient not taking: Reported on 04/20/2018 09/20/17   Sherren Mocha, MD  doxycycline (VIBRA-TABS) 100 MG tablet Take 1 tablet (100 mg total) by mouth 2 (two) times daily for 7 days. 04/20/18 04/27/18  [name], MD  montelukast (SINGULAIR) 10 MG tablet Take 1 tablet (10 mg total) by mouth at bedtime. Patient not taking: Reported on 04/20/2018 12/09/17   [name], MD  predniSONE (DELTASONE) 20 MG tablet Take 2 tablets (40 mg total) by mouth daily with breakfast for 3 days. 04/20/18 04/23/18  [name], MD    No Known Allergies  Patient Active Problem List   Diagnosis Date Noted  .  Acute recurrent sinusitis 10/14/2017  . History of peptic ulcer 04/30/2017  . Hyperlipidemia 12/11/2015  . Insomnia 12/11/2015  . Panic attack as reaction to stress 12/11/2015  . Tobacco abuse 12/11/2015  . Chronic sinusitis 12/11/2015  . Diabetes (HCC) 05/15/2015  . Hypothyroid 04/29/2011  . BMI 40.0-44.9, adult (HCC) 04/29/2011  . Depression with anxiety 04/29/2011  . GERD (gastroesophageal reflux disease) 04/29/2011  . 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  . Nicotine addiction 04/29/2011  . Hypertension 04/29/2011  . Murmur, cardiac 04/29/2011    Past Medical History:  Diagnosis Date  . Allergy   . Anxiety   . Depression   .  Heart murmur   . Hypertension   . Thyroid disease   . Ulcer     Past Surgical History:  Procedure Laterality Date  . BREAST SURGERY    . CESAREAN SECTION    . CHOLECYSTECTOMY    . NASAL SEPTUM SURGERY      Social History   Socioeconomic History  . Marital status: Divorced    Spouse name: Not on file  . Number of children: Not on file  . Years of education: Not on file  . Highest education level: Not on file  Occupational History  . Occupation: Unemployed  Social Needs  . Financial resource strain: Not on file  . Food insecurity:    Worry: Not on file    Inability: Not on file  . Transportation needs:    Medical: Not on file    Non-medical: Not on file  Tobacco Use  . Smoking status: Current Every Day Smoker    Packs/day: 1.50    Years: 40.00    Pack years: 60.00    Types: Cigarettes    Last attempt to quit: 01/20/2012    Years since quitting: 6.2  . Smokeless tobacco: Never Used  . Tobacco comment: Pt doing E-cigarette as of 01/20/12  Substance and Sexual Activity  . Alcohol use: No  . Drug use: No  . Sexual activity: Never  Lifestyle  . Physical activity:    Days per week: Not on file    Minutes per session: Not on file  . Stress: Not on file  Relationships  . Social connections:    Talks on phone: Not on file    Gets together: Not on file    Attends religious service: Not on file    Active member of club or organization: Not on file    Attends meetings of clubs or organizations: Not on file    Relationship status: Not on file  . Intimate partner violence:    Fear of current or ex partner: Not on file    Emotionally abused: Not on file    Physically abused: Not on file    Forced sexual activity: Not on file  Other Topics Concern  . Not on file  Social History Narrative   Patient on worker's compensation for an injury sustained on the job 5 years ago.    Family History  Problem Relation Age of Onset  . Lung cancer Mother   . Dementia Father    . Atrial fibrillation Father      Review of Systems  Constitutional: Negative.  Negative for chills and fever.  HENT: Negative for sore throat.   Eyes: Negative for blurred vision and double vision.  Respiratory: Negative.  Negative for cough and shortness of breath.   Cardiovascular: Negative.  Negative for chest pain and palpitations.  Gastrointestinal: Negative.  Negative for abdominal pain,  diarrhea, nausea and vomiting.  Genitourinary: Negative.   Skin: Positive for itching and rash.  Neurological: Negative.  Negative for dizziness and headaches.  Endo/Heme/Allergies: Negative.    Vitals:   04/20/18 1330  BP: (!) 160/78  Pulse: 94  Resp: 16  Temp: 98.8 F (37.1 C)  SpO2: 96%     Physical Exam Vitals signs reviewed.  Constitutional:      Appearance: Normal appearance.  HENT:     Head: Normocephalic and atraumatic.     Nose: Nose normal.     Mouth/Throat:     Mouth: Mucous membranes are moist.     Pharynx: Oropharynx is clear.  Eyes:     Extraocular Movements: Extraocular movements intact.     Conjunctiva/sclera: Conjunctivae normal.     Pupils: Pupils are equal, round, and reactive to light.  Neck:     Musculoskeletal: Normal range of motion and neck supple.  Cardiovascular:     Rate and Rhythm: Normal rate and regular rhythm.     Heart sounds: Normal heart sounds.  Pulmonary:     Effort: Pulmonary effort is normal.     Breath sounds: Normal breath sounds.  Abdominal:     Palpations: Abdomen is soft.     Tenderness: There is no abdominal tenderness.  Musculoskeletal: Normal range of motion.  Skin:    General: Skin is warm and dry.     Capillary Refill: Capillary refill takes less than 2 seconds.     Findings: Rash (Mild facial erythema) present.  Neurological:     General: No focal deficit present.     Mental Status: She is alert and oriented to person, place, and time.  Psychiatric:        Mood and Affect: Mood normal.        Behavior: Behavior  normal.    A total of 25 minutes was spent in the room with the patient, greater than 50% of which was in counseling/coordination of care regarding differential diagnosis, treatment, medication and medication changes, prognosis, and need for follow-up if no better or worse. Medications reviewed and refills sent.  ASSESSMENT & PLAN: Jasmine Long was seen today for rash.  Diagnoses and all orders for this visit:  Allergic reaction, initial encounter -     predniSONE (DELTASONE) 20 MG tablet; Take 2 tablets (40 mg total) by mouth daily with breakfast for 3 days.  Tobacco abuse -     nicotine (NICODERM CQ - DOSED IN MG/24 HOURS) 21 mg/24hr patch; Place 1 patch (21 mg total) onto the skin daily.  Facial rash -     doxycycline (VIBRA-TABS) 100 MG tablet; Take 1 tablet (100 mg total) by mouth 2 (two) times daily for 7 days.  Type 2 diabetes mellitus without complication, without long-term current use of insulin (HCC) -     metFORMIN (GLUCOPHAGE) 1000 MG tablet; TAKE 1 TABLET BY MOUTH 2 TIMES DAILY WITH MEALS -     Hemoglobin A1c -     Comprehensive metabolic panel  Essential hypertension -     metoprolol succinate (TOPROL XL) 25 MG 24 hr tablet; Take 1 tablet (25 mg total) by mouth daily. -     losartan (COZAAR) 100 MG tablet; Take 1 tablet (100 mg total) by mouth daily.  Hyperlipidemia, unspecified hyperlipidemia type -     atorvastatin (LIPITOR) 20 MG tablet; Take 1 tablet (20 mg total) by mouth daily. -     Lipid panel  BMI 40.0-44.9, adult (HCC)  Depression with anxiety -  buPROPion (WELLBUTRIN XL) 300 MG 24 hr tablet; Take 1 tablet (300 mg total) by mouth daily. -     ALPRAZolam (XANAX) 0.5 MG tablet; Take 1 tablet (0.5 mg total) by mouth daily as needed for anxiety.  Cigarette nicotine dependence without complication -     nicotine (NICODERM CQ - DOSED IN MG/24 HOURS) 21 mg/24hr patch; Place 1 patch (21 mg total) onto the skin daily.  Thyroid disease -     levothyroxine  (SYNTHROID, LEVOTHROID) 125 MCG tablet; Take 1 tablet (125 mcg total) by mouth daily before breakfast. -     TSH    Patient Instructions       If you have lab work done today you will be contacted with your lab results within the next 2 weeks.  If you have not heard from [place] then please contact us. The fastest way to get your results is to register for My Chart.   IF you received an x-ray today, you will receive an invoice from [facility]. Please contact [facility] at [phone] with questions or concerns regarding your invoice.   IF you received labwork today, you will receive an invoice from [place]. Please contact [facility] at [phone] with questions or concerns regarding your invoice.   Our billing staff will not be able to assist you with questions regarding bills from these companies.  You will be contacted with the lab results as soon as they are available. The fastest way to get your results is to activate your My Chart account. Instructions are located on the last page of this paperwork. If you have not heard from [place] regarding the results in 2 weeks, please contact this office.     Rash, Adult  A rash is a change in the color of your skin. A rash can also change the way your skin feels. There are many different conditions and factors that can cause a rash. Follow these instructions at home: The goal of treatment is to stop the itching and keep the rash from spreading. Watch for any changes in your symptoms. Let your doctor know about them. Follow these instructions to help with your condition: Medicine Take or apply over-the-counter and prescription medicines only as told by your doctor. These may include medicines:  To treat red or swollen skin (corticosteroid creams).  To treat itching.  To treat an allergy (oral antihistamines).  To treat very bad symptoms (oral corticosteroids).  Skin care  Put cool cloths (compresses) on the affected  areas.  Do not scratch or rub your skin.  Avoid covering the rash. Make sure that the rash is exposed to air as much as possible. Managing itching and discomfort  Avoid hot showers or baths. These can make itching worse. A cold shower may help.  Try taking a bath with: ? Epsom salts. You can get these at your local pharmacy or grocery store. Follow the instructions on the package. ? Baking soda. Pour a small amount into the bath as told by your doctor. ? Colloidal oatmeal. You can get this at your local pharmacy or grocery store. Follow the instructions on the package.  Try putting baking soda paste onto your skin. Stir water into baking soda until it gets like a paste.  Try putting on a lotion that relieves itchiness (calamine lotion).  Keep cool and out of the sun. Sweating and being hot can make itching worse. General instructions   Rest as needed.  Drink enough fluid to keep your pee (urine)  pale yellow.  Wear loose-fitting clothing.  Avoid scented soaps, detergents, and perfumes. Use gentle soaps, detergents, perfumes, and other cosmetic products.  Avoid anything that causes your rash. Keep a journal to help track what causes your rash. Write down: ? What you eat. ? What cosmetic products you use. ? What you drink. ? What you wear. This includes jewelry.  Keep all follow-up visits as told by your doctor. This is important. Contact a doctor if:  You sweat at night.  You lose weight.  You pee (urinate) more than normal.  You pee less than normal, or you notice that your pee is a darker color than normal.  You feel weak.  You throw up (vomit).  Your skin or the whites of your eyes look yellow (jaundice).  Your skin: ? Tingles. ? Is numb.  Your rash: ? Does not go away after a few days. ? Gets worse.  You are: ? More thirsty than normal. ? More tired than normal.  You have: ? New symptoms. ? Pain in your belly (abdomen). ? A fever. ? Watery poop  (diarrhea). Get help right away if:  You have a fever and your symptoms suddenly get worse.  You start to feel mixed up (confused).  You have a very bad headache or a stiff neck.  You have very bad joint pains or stiffness.  You have jerky movements that you cannot control (seizure).  Your rash covers all or most of your body. The rash may or may not be painful.  You have blisters that: ? Are on top of the rash. ? Grow larger. ? Grow together. ? Are painful. ? Are inside your nose or mouth.  You have a rash that: ? Looks like purple pinprick-sized spots all over your body. ? Has a "bull's eye" or looks like a target. ? Is red and painful, causes your skin to peel, and is not from being in the sun too long. Summary  A rash is a change in the color of your skin. A rash can also change the way your skin feels.  The goal of treatment is to stop the itching and keep the rash from spreading.  Take or apply over-the-counter and prescription medicines only as told by your doctor.  Contact a doctor if you have new symptoms or symptoms that get worse.  Keep all follow-up visits as told by your doctor. This is important. This information is not intended to replace advice given to you by your health care provider. Make sure you discuss any questions you have with your health care provider. Document Released: 07/01/2007 Document Revised: 08/16/2017 Document Reviewed: 08/16/2017 Elsevier Interactive Patient Education  2019 Elsevier Inc.      Edwina Barth, MD [facility]

## 2018-04-20 NOTE — Addendum Note (Signed)
Addended by: Evie Lacks on: 04/20/2018 04:37 PM   Modules accepted: Orders

## 2018-04-20 NOTE — Patient Instructions (Addendum)
   If you have lab work done today you will be contacted with your lab results within the next 2 weeks.  If you have not heard from us then please contact us. The fastest way to get your results is to register for My Chart.   IF you received an x-ray today, you will receive an invoice from [facility]. Please contact [facility] at [phone] with questions or concerns regarding your invoice.   IF you received labwork today, you will receive an invoice from [facility]. Please contact [facility] at [phone] with questions or concerns regarding your invoice.   Our billing staff will not be able to assist you with questions regarding bills from these companies.  You will be contacted with the lab results as soon as they are available. The fastest way to get your results is to activate your My Chart account. Instructions are located on the last page of this paperwork. If you have not heard from us regarding the results in 2 weeks, please contact this office.     Rash, Adult  A rash is a change in the color of your skin. A rash can also change the way your skin feels. There are many different conditions and factors that can cause a rash. Follow these instructions at home: The goal of treatment is to stop the itching and keep the rash from spreading. Watch for any changes in your symptoms. Let your doctor know about them. Follow these instructions to help with your condition: Medicine Take or apply over-the-counter and prescription medicines only as told by your doctor. These may include medicines:  To treat red or swollen skin (corticosteroid creams).  To treat itching.  To treat an allergy (oral antihistamines).  To treat very bad symptoms (oral corticosteroids).  Skin care  Put cool cloths (compresses) on the affected areas.  Do not scratch or rub your skin.  Avoid covering the rash. Make sure that the rash is exposed to air as much as possible. Managing  itching and discomfort  Avoid hot showers or baths. These can make itching worse. A cold shower may help.  Try taking a bath with: ? Epsom salts. You can get these at your local pharmacy or grocery store. Follow the instructions on the package. ? Baking soda. Pour a small amount into the bath as told by your doctor. ? Colloidal oatmeal. You can get this at your local pharmacy or grocery store. Follow the instructions on the package.  Try putting baking soda paste onto your skin. Stir water into baking soda until it gets like a paste.  Try putting on a lotion that relieves itchiness (calamine lotion).  Keep cool and out of the sun. Sweating and being hot can make itching worse. General instructions   Rest as needed.  Drink enough fluid to keep your pee (urine) pale yellow.  Wear loose-fitting clothing.  Avoid scented soaps, detergents, and perfumes. Use gentle soaps, detergents, perfumes, and other cosmetic products.  Avoid anything that causes your rash. Keep a journal to help track what causes your rash. Write down: ? What you eat. ? What cosmetic products you use. ? What you drink. ? What you wear. This includes jewelry.  Keep all follow-up visits as told by your doctor. This is important. Contact a doctor if:  You sweat at night.  You lose weight.  You pee (urinate) more than normal.  You pee less than normal, or you notice that your pee is a darker color than   normal.  You feel weak.  You throw up (vomit).  Your skin or the whites of your eyes look yellow (jaundice).  Your skin: ? Tingles. ? Is numb.  Your rash: ? Does not go away after a few days. ? Gets worse.  You are: ? More thirsty than normal. ? More tired than normal.  You have: ? New symptoms. ? Pain in your belly (abdomen). ? A fever. ? Watery poop (diarrhea). Get help right away if:  You have a fever and your symptoms suddenly get worse.  You start to feel mixed up (confused).  You  have a very bad headache or a stiff neck.  You have very bad joint pains or stiffness.  You have jerky movements that you cannot control (seizure).  Your rash covers all or most of your body. The rash may or may not be painful.  You have blisters that: ? Are on top of the rash. ? Grow larger. ? Grow together. ? Are painful. ? Are inside your nose or mouth.  You have a rash that: ? Looks like purple pinprick-sized spots all over your body. ? Has a "bull's eye" or looks like a target. ? Is red and painful, causes your skin to peel, and is not from being in the sun too long. Summary  A rash is a change in the color of your skin. A rash can also change the way your skin feels.  The goal of treatment is to stop the itching and keep the rash from spreading.  Take or apply over-the-counter and prescription medicines only as told by your doctor.  Contact a doctor if you have new symptoms or symptoms that get worse.  Keep all follow-up visits as told by your doctor. This is important. This information is not intended to replace advice given to you by your health care provider. Make sure you discuss any questions you have with your health care provider. Document Released: 07/01/2007 Document Revised: 08/16/2017 Document Reviewed: 08/16/2017 Elsevier Interactive Patient Education  2019 Elsevier Inc.  

## 2018-04-21 LAB — COMPREHENSIVE METABOLIC PANEL
ALT: 13 IU/L (ref 0–32)
AST: 13 IU/L (ref 0–40)
Albumin/Globulin Ratio: 2 (ref 1.2–2.2)
Albumin: 4.7 g/dL (ref 3.8–4.8)
Alkaline Phosphatase: 103 IU/L (ref 39–117)
BUN/Creatinine Ratio: 17 (ref 12–28)
BUN: 17 mg/dL (ref 8–27)
Bilirubin Total: 0.4 mg/dL (ref 0.0–1.2)
CO2: 18 mmol/L — AB (ref 20–29)
CREATININE: 0.99 mg/dL (ref 0.57–1.00)
Calcium: 9.6 mg/dL (ref 8.7–10.3)
Chloride: 102 mmol/L (ref 96–106)
GFR calc Af Amer: 69 mL/min/{1.73_m2} (ref >59)
GFR calc non Af Amer: 60 mL/min/{1.73_m2} (ref >59)
Globulin, Total: 2.4 g/dL (ref 1.5–4.5)
Glucose: 169 mg/dL — ABNORMAL HIGH (ref 65–99)
Potassium: 4.6 mmol/L (ref 3.5–5.2)
Sodium: 141 mmol/L (ref 134–144)
Total Protein: 7.1 g/dL (ref 6.0–8.5)

## 2018-04-21 LAB — TSH: TSH: 1.36 u[IU]/mL (ref 0.450–4.500)

## 2018-04-21 LAB — LIPID PANEL
Chol/HDL Ratio: 3.6 ratio (ref 0.0–4.4)
Cholesterol, Total: 187 mg/dL (ref 100–199)
HDL: 52 mg/dL (ref >39)
LDL Calculated: 102 mg/dL — ABNORMAL HIGH (ref 0–99)
Triglycerides: 163 mg/dL — ABNORMAL HIGH (ref 0–149)
VLDL Cholesterol Cal: 33 mg/dL (ref 5–40)

## 2018-04-21 LAB — HEMOGLOBIN A1C
Est. average glucose Bld gHb Est-mCnc: 160 mg/dL
Hgb A1c MFr Bld: 7.2 % — ABNORMAL HIGH (ref 4.8–5.6)

## 2018-04-21 NOTE — Telephone Encounter (Signed)
Pt was seen yesterday about medication refills and other concerns.

## 2018-04-22 ENCOUNTER — Telehealth: Payer: Self-pay | Admitting: Emergency Medicine

## 2018-04-22 NOTE — Telephone Encounter (Signed)
Copied from CRM [id number]. Topic: General - Other >> Apr 22, 2018  1:42 PM Jaquita Rector A wrote: Reason for CRM: Patient called back say that she received a message to call regarding her lab results. She is requesting a call back on Ph# [phone]

## 2018-04-23 NOTE — Telephone Encounter (Signed)
Spoke with pt today and informed her of her lab results, she verbalized understanding.

## 2018-04-25 ENCOUNTER — Telehealth: Payer: Self-pay | Admitting: Emergency Medicine

## 2018-04-25 NOTE — Telephone Encounter (Signed)
Patient called and said that she was seen in the office last week and was given a prescription for Prednisone and Doxycyline she want to know can she get a refill on both because the area is still swollen and pink

## 2018-04-25 NOTE — Telephone Encounter (Signed)
Patient called and said that she was seen in the office last week and was given a prescription for Prednisone and Doxycyline she want to know can she get a refill on both because the area is still swollen and pink 

## 2018-04-26 NOTE — Telephone Encounter (Signed)
Please advise 

## 2018-04-27 ENCOUNTER — Other Ambulatory Visit: Payer: Self-pay | Admitting: Emergency Medicine

## 2018-04-27 NOTE — Telephone Encounter (Signed)
Not advisable.  If symptoms persist after 1-2 more weeks then she needs to be seen again.  Thanks.

## 2018-04-27 NOTE — Telephone Encounter (Signed)
Informed pt of Dr. Alvy Bimler  recommendations and advised her to give our offices a call if the issues is not better ar worst to schedule a OV.

## 2018-04-28 ENCOUNTER — Ambulatory Visit: Payer: Self-pay

## 2018-04-28 NOTE — Telephone Encounter (Signed)
Pt c/o nausea since Tuesday. No vomiting or fever. Pt has finished her abx, and steroids. Pt is tolerating fluids. Pt still has an appetite.  Pt given care advice and pt verbalized understanding. Pt would like for something for nausea.         Reason for Disposition . Taking prescription medication that could cause nausea (e.g., narcotics/opiates, antibiotics, OCPs, many others)  Answer Assessment - Initial Assessment Questions 1. NAUSEA SEVERITY: "How bad is the nausea?" (e.g., mild, moderate, severe; dehydration, weight loss)   - MILD: loss of appetite without change in eating habits   - MODERATE: decreased oral intake without significant weight loss, dehydration, or malnutrition   - SEVERE: inadequate caloric or fluid intake, significant weight loss, symptoms of dehydration     Very mild- 2. ONSET: "When did the nausea begin?"     Tuesday 3. VOMITING: "Any vomiting?" If so, ask: "How many times today?"     no 4. RECURRENT SYMPTOM: "Have you had nausea before?" If so, ask: "When was the last time?" "What happened that time?"     no 5. CAUSE: "What do you think is causing the nausea?"     Antibiotics or prednisone 6. PREGNANCY: "Is there any chance you are pregnant?" (e.g., unprotected intercourse, missed birth control pill, broken condom)     n/a  Protocols used: NAUSEA-A-AH

## 2018-04-28 NOTE — Telephone Encounter (Signed)
Can we give her something for nausea she was seen 04/20/2018

## 2018-04-30 ENCOUNTER — Other Ambulatory Visit: Payer: Self-pay | Admitting: Emergency Medicine

## 2018-04-30 MED ORDER — ONDANSETRON HCL 4 MG PO TABS: 4.0000 mg | ORAL_TABLET | Freq: Three times a day (TID) | ORAL | 0 refills | Status: DC | PRN

## 2018-04-30 NOTE — Telephone Encounter (Signed)
Zofran ODT sent to her pharmacy of record.  Thanks.

## 2018-05-23 ENCOUNTER — Ambulatory Visit (INDEPENDENT_AMBULATORY_CARE_PROVIDER_SITE_OTHER): Payer: Medicare HMO | Admitting: Emergency Medicine

## 2018-05-23 ENCOUNTER — Other Ambulatory Visit: Payer: Self-pay

## 2018-05-23 VITALS — Ht 68.5 in | Wt 268.0 lb

## 2018-05-23 DIAGNOSIS — Z0000 Encounter for general adult medical examination without abnormal findings: Secondary | ICD-10-CM | POA: Diagnosis not present

## 2018-05-23 NOTE — Patient Instructions (Signed)
Thank you for taking time to come for your Medicare Wellness Visit. I appreciate your ongoing commitment to your health goals. Please review the following plan we discussed and let me know if I can assist you in the future.  Vesper Trant LPN  Healthy Eating Following a healthy eating pattern may help you to achieve and maintain a healthy body weight, reduce the risk of chronic disease, and live a long and productive life. It is important to follow a healthy eating pattern at an appropriate calorie level for your body. Your nutritional needs should be met primarily through food by choosing a variety of nutrient-rich foods. What are tips for following this plan? Reading food labels  Read labels and choose the following: ? Reduced or low sodium. ? Juices with 100% fruit juice. ? Foods with low saturated fats and high polyunsaturated and monounsaturated fats. ? Foods with whole grains, such as whole wheat, cracked wheat, brown rice, and wild rice. ? Whole grains that are fortified with folic acid. This is recommended for women who are pregnant or who want to become pregnant.  Read labels and avoid the following: ? Foods with a lot of added sugars. These include foods that contain brown sugar, corn sweetener, corn syrup, dextrose, fructose, glucose, high-fructose corn syrup, honey, invert sugar, lactose, malt syrup, maltose, molasses, raw sugar, sucrose, trehalose, or turbinado sugar.  Do not eat more than the following amounts of added sugar per day:  6 teaspoons (25 g) for women.  9 teaspoons (38 g) for men. ? Foods that contain processed or refined starches and grains. ? Refined grain products, such as white flour, degermed cornmeal, white bread, and white rice. Shopping  Choose nutrient-rich snacks, such as vegetables, whole fruits, and nuts. Avoid high-calorie and high-sugar snacks, such as potato chips, fruit snacks, and candy.  Use oil-based dressings and spreads on foods instead of  solid fats such as butter, stick margarine, or cream cheese.  Limit pre-made sauces, mixes, and "instant" products such as flavored rice, instant noodles, and ready-made pasta.  Try more plant-protein sources, such as tofu, tempeh, black beans, edamame, lentils, nuts, and seeds.  Explore eating plans such as the Mediterranean diet or vegetarian diet. Cooking  Use oil to saut or stir-fry foods instead of solid fats such as butter, stick margarine, or lard.  Try baking, boiling, grilling, or broiling instead of frying.  Remove the fatty part of meats before cooking.  Steam vegetables in water or broth. Meal planning   At meals, imagine dividing your plate into fourths: ? One-half of your plate is fruits and vegetables. ? One-fourth of your plate is whole grains. ? One-fourth of your plate is protein, especially lean meats, poultry, eggs, tofu, beans, or nuts.  Include low-fat dairy as part of your daily diet. Lifestyle  Choose healthy options in all settings, including home, work, school, restaurants, or stores.  Prepare your food safely: ? Wash your hands after handling raw meats. ? Keep food preparation surfaces clean by regularly washing with hot, soapy water. ? Keep raw meats separate from ready-to-eat foods, such as fruits and vegetables. ? Cook seafood, meat, poultry, and eggs to the recommended internal temperature. ? Store foods at safe temperatures. In general:  Keep cold foods at 40F (4.4C) or below.  Keep hot foods at 140F (60C) or above.  Keep your freezer at 0F (-17.8C) or below.  Foods are no longer safe to eat when they have been between the temperatures of 40-140F (4.4-60C) for   4.4-60C) for more than 2 hours. What foods should I eat? Fruits Aim to eat 2 cup-equivalents of fresh, canned (in natural juice), or frozen fruits each day. Examples of 1 cup-equivalent of fruit include 1 small apple, 8 large strawberries, 1 cup canned fruit,  cup dried fruit, or 1  cup 100% juice. Vegetables Aim to eat 2-3 cup-equivalents of fresh and frozen vegetables each day, including different varieties and colors. Examples of 1 cup-equivalent of vegetables include 2 medium carrots, 2 cups raw, leafy greens, 1 cup chopped vegetable (raw or cooked), or 1 medium baked potato. Grains Aim to eat 6 ounce-equivalents of whole grains each day. Examples of 1 ounce-equivalent of grains include 1 slice of bread, 1 cup ready-to-eat cereal, 3 cups popcorn, or  cup cooked rice, pasta, or cereal. Meats and other proteins Aim to eat 5-6 ounce-equivalents of protein each day. Examples of 1 ounce-equivalent of protein include 1 egg, 1/2 cup nuts or seeds, or 1 tablespoon (16 g) peanut butter. A cut of meat or fish that is the size of a deck of cards is about 3-4 ounce-equivalents.  Of the protein you eat each week, try to have at least 8 ounces come from seafood. This includes salmon, trout, herring, and anchovies. Dairy Aim to eat 3 cup-equivalents of fat-free or low-fat dairy each day. Examples of 1 cup-equivalent of dairy include 1 cup (240 mL) milk, 8 ounces (250 g) yogurt, 1 ounces (44 g) natural cheese, or 1 cup (240 mL) fortified soy milk. Fats and oils  Aim for about 5 teaspoons (21 g) per day. Choose monounsaturated fats, such as canola and olive oils, avocados, peanut butter, and most nuts, or polyunsaturated fats, such as sunflower, corn, and soybean oils, walnuts, pine nuts, sesame seeds, sunflower seeds, and flaxseed. Beverages  Aim for six 8-oz glasses of water per day. Limit coffee to three to five 8-oz cups per day.  Limit caffeinated beverages that have added calories, such as soda and energy drinks.  Limit alcohol intake to no more than 1 drink a day for nonpregnant women and 2 drinks a day for men. One drink equals 12 oz of beer (355 mL), 5 oz of wine (148 mL), or 1 oz of hard liquor (44 mL). Seasoning and other foods  Avoid adding excess amounts of salt to  your foods. Try flavoring foods with herbs and spices instead of salt.  Avoid adding sugar to foods.  Try using oil-based dressings, sauces, and spreads instead of solid fats. This information is based on general [place] nutrition guidelines. For more information, visit [employer].es. Exact amounts may vary based on your nutrition needs. Summary  A healthy eating plan may help you to maintain a healthy weight, reduce the risk of chronic diseases, and stay active throughout your life.  Plan your meals. Make sure you eat the right portions of a variety of nutrient-rich foods.  Try baking, boiling, grilling, or broiling instead of frying.  Choose healthy options in all settings, including home, work, school, restaurants, or stores. This information is not intended to replace advice given to you by your health care provider. Make sure you discuss any questions you have with your health care provider. Document Released: 04/26/2017 Document Revised: 04/26/2017 Document Reviewed: 04/26/2017 Elsevier Interactive Patient Education  2019 [place] Directive  Advance directives are legal documents that let you make choices ahead of time about your health care and medical treatment in case you become unable to communicate for yourself. Advance directives are  you become unable to communicate for yourself. Advance directives are a way for you to communicate your wishes to family, friends, and health care providers. This can help convey your decisions about end-of-life care if you become unable to communicate. Discussing and writing advance directives should happen over time rather than all at once. Advance directives can be changed depending on your situation and what you want, even after you have signed the advance directives. If you do not have an advance directive, some states assign family decision makers to act on your behalf based on how closely you are related to them. Each state has its own laws regarding advance directives. You  may want to check with your health care provider, attorney, or state representative about the laws in your state. There are different types of advance directives, such as:  Medical power of attorney.  Living will.  Do not resuscitate (DNR) or do not attempt resuscitation (DNAR) order. Health care proxy and medical power of attorney A health care proxy, also called a health care agent, is a person who is appointed to make medical decisions for you in cases in which you are unable to make the decisions yourself. Generally, people choose someone they know well and trust to represent their preferences. Make sure to ask this person for an agreement to act as your proxy. A proxy may have to exercise judgment in the event of a medical decision for which your wishes are not known. A medical power of attorney is a legal document that names your health care proxy. Depending on the laws in your state, after the document is written, it may also need to be:  Signed.  Notarized.  Dated.  Copied.  Witnessed.  Incorporated into your medical record. You may also want to appoint someone to manage your financial affairs in a situation in which you are unable to do so. This is called a durable power of attorney for finances. It is a separate legal document from the durable power of attorney for health care. You may choose the same person or someone different from your health care proxy to act as your agent in financial matters. If you do not appoint a proxy, or if there is a concern that the proxy is not acting in your best interests, a court-appointed guardian may be designated to act on your behalf. Living will A living will is a set of instructions documenting your wishes about medical care when you cannot express them yourself. Health care providers should keep a copy of your living will in your medical record. You may want to give a copy to family members or friends. To alert caregivers in case of an  emergency, you can place a card in your wallet to let them know that you have a living will and where they can find it. A living will is used if you become:  Terminally ill.  Incapacitated.  Unable to communicate or make decisions. Items to consider in your living will include:  The use or non-use of life-sustaining equipment, such as dialysis machines and breathing machines (ventilators).  A DNR or DNAR order, which is the instruction not to use cardiopulmonary resuscitation (CPR) if breathing or heartbeat stops.  The use or non-use of tube feeding.  Withholding of food and fluids.  Comfort (palliative) care when the goal becomes comfort rather than a cure.  Organ and tissue donation. A living will does not give instructions for distributing your money and property if you should   of a lawyer when writing a will. Decisions about taxes, beneficiaries, and asset distribution will be legally binding. This process can relieve your family and friends of any concerns surrounding disputes or questions that may come up about the distribution of your assets. DNR or DNAR A DNR or DNAR order is a request not to have CPR in the event that your heart stops beating or you stop breathing. If a DNR or DNAR order has not been made and shared, a health care provider will try to help any patient whose heart has stopped or who has stopped breathing. If you plan to have surgery, talk with your health care provider about how your DNR or DNAR order will be followed if problems occur. Summary  Advance directives are the legal documents that allow you to make choices ahead of time about your health care and medical treatment in case you become unable to communicate for yourself.  The process of discussing and writing advance directives should happen over time. You can change the advance directives, even after you have signed them.  Advance directives include DNR or DNAR orders, living  wills, and designating an agent as your medical power of attorney. This information is not intended to replace advice given to you by your health care provider. Make sure you discuss any questions you have with your health care provider. Document Released: 04/21/2007 Document Revised: 12/02/2015 Document Reviewed: 12/02/2015 Elsevier Interactive Patient Education  2019 [employer].

## 2018-05-23 NOTE — Progress Notes (Addendum)
Presents today for [employer] Visit  I connected with  Jasmine Long on 06/13/18 by a video enabled telemedicine application and verified that I am speaking with the correct person using two identifiers.   . The patient expressed understanding and agreed to proceed.    Date of last exam: 04/20/2018  Interpreter used for this visit? No  Exam preformed over phone two identifiers used, unable to do [email or web address].  Patient Care Team: [name], MD as PCP - General (Internal Medicine)   Other items to address today:  Discussed eye/dental yearly exam   Immunizations discussed patient will wait till follow up to address  Tetanus declined due to inusrance    Other Screening: Last screening for diabetes: 04/20/2018 Last lipid screening: 04/20/2018  ADVANCE DIRECTIVES: Discussed: yes On File: no Materials Provided: yes  Immunization status:  There is no immunization history for the selected administration types on file for this patient.   Health Maintenance Due  Topic Date Due  . OPHTHALMOLOGY EXAM  04/29/1962  . TETANUS/TDAP  04/29/1971  . MAMMOGRAM  04/29/2002  . COLONOSCOPY  02/28/2010  . DEXA SCAN  04/28/2017  . PNA vac Low Risk Adult (1 of 2 - PCV13) 04/28/2017     Functional Status Survey: Is the patient deaf or have difficulty hearing?: No Does the patient have difficulty seeing, even when wearing glasses/contacts?: No Does the patient have difficulty concentrating, remembering, or making decisions?: No Does the patient have difficulty walking or climbing stairs?: No Does the patient have difficulty dressing or bathing?: No Does the patient have difficulty doing errands alone such as visiting a doctor's office or shopping?: No   6CIT Screen 05/23/2018  What Year? 0 points  What month? 0 points  What time? 0 points  Count back from 20 0 points  Months in reverse 0 points  Repeat phrase 0 points  Total Score 0        Clinical  Support from 05/23/2018 in [facility]  AUDIT-C Score  0       Home Environment:   Patient lives in a 3 story with son  No trouble  No scattered rugs  No grab bars Well lit    Patient Active Problem List   Diagnosis Date Noted  . Acute recurrent sinusitis 10/14/2017  . History of peptic ulcer 04/30/2017  . Hyperlipidemia 12/11/2015  . Insomnia 12/11/2015  . Panic attack as reaction to stress 12/11/2015  . Tobacco abuse 12/11/2015  . Chronic sinusitis 12/11/2015  . Diabetes (HCC) 05/15/2015  . Hypothyroid 04/29/2011  . BMI 40.0-44.9, adult (HCC) 04/29/2011  . Depression with anxiety 04/29/2011  . GERD (gastroesophageal reflux disease) 04/29/2011  . 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  . Nicotine addiction 04/29/2011  . Hypertension 04/29/2011  . Murmur, cardiac 04/29/2011     Past Medical History:  Diagnosis Date  . Allergy   . Anxiety   . Depression   . Heart murmur   . Hypertension   . Thyroid disease   . Ulcer      Past Surgical History:  Procedure Laterality Date  . BREAST SURGERY    . CESAREAN SECTION    . CHOLECYSTECTOMY    . NASAL SEPTUM SURGERY       Family History  Problem Relation Age of Onset  . Lung cancer Mother   . Dementia Father   . Atrial fibrillation Father      Social History   Socioeconomic History  .  Marital status: Divorced    Spouse name: Not on file  . Number of children: Not on file  . Years of education: Not on file  . Highest education level: Not on file  Occupational History  . Occupation: Unemployed  Social Needs  . Financial resource strain: Not on file  . Food insecurity:    Worry: Not on file    Inability: Not on file  . Transportation needs:    Medical: Not on file    Non-medical: Not on file  Tobacco Use  . Smoking status: Current Every Day Smoker    Packs/day: 1.50    Years: 40.00    Pack years: 60.00    Types: Cigarettes    Last attempt to quit: 01/20/2012    Years since quitting: 6.3   . Smokeless tobacco: Never Used  . Tobacco comment: Pt doing E-cigarette as of 01/20/12  Substance and Sexual Activity  . Alcohol use: No  . Drug use: No  . Sexual activity: Never  Lifestyle  . Physical activity:    Days per week: Not on file    Minutes per session: Not on file  . Stress: Not on file  Relationships  . Social connections:    Talks on phone: Not on file    Gets together: Not on file    Attends religious service: Not on file    Active member of club or organization: Not on file    Attends meetings of clubs or organizations: Not on file    Relationship status: Not on file  . Intimate partner violence:    Fear of current or ex partner: Not on file    Emotionally abused: Not on file    Physically abused: Not on file    Forced sexual activity: Not on file  Other Topics Concern  . Not on file  Social History Narrative   Patient on worker's compensation for an injury sustained on the job 5 years ago.     No Known Allergies   Prior to Admission medications   Medication Sig Start Date End Date Taking? Authorizing Provider  ALPRAZolam Prudy Feeler(XANAX) 0.5 MG tablet Take 1 tablet (0.5 mg total) by mouth daily as needed for anxiety. 04/20/18  Yes Sagardia, Eilleen KempfMiguel Jose, MD  atorvastatin (LIPITOR) 20 MG tablet Take 1 tablet (20 mg total) by mouth daily. 04/20/18 07/19/18 Yes Sagardia, Eilleen KempfMiguel Jose, MD  azelastine (ASTELIN) 0.1 % nasal spray Place 2 sprays into both nostrils 2 (two) times daily. For sinus congestion from allergies 09/20/17  Yes Sherren MochaShaw, Eva N, MD  buPROPion (WELLBUTRIN XL) 300 MG 24 hr tablet Take 1 tablet (300 mg total) by mouth daily. 04/20/18  Yes Sagardia, Eilleen KempfMiguel Jose, MD  clonazePAM (KLONOPIN) 1 MG tablet TAKE 1 TABLET BY MOUTH AT BEDTIME FOR SLEEP OR ANXIETY 09/20/17  Yes Sherren MochaShaw, Eva N, MD  levothyroxine (SYNTHROID, LEVOTHROID) 125 MCG tablet Take 1 tablet (125 mcg total) by mouth daily before breakfast. 04/20/18  Yes Sagardia, Eilleen KempfMiguel Jose, MD  losartan (COZAAR) 100 MG  tablet Take 1 tablet (100 mg total) by mouth daily. 04/20/18  Yes Sagardia, Eilleen KempfMiguel Jose, MD  metFORMIN (GLUCOPHAGE) 1000 MG tablet TAKE 1 TABLET BY MOUTH 2 TIMES DAILY WITH MEALS 04/20/18  Yes Sagardia, Eilleen KempfMiguel Jose, MD  metoprolol succinate (TOPROL XL) 25 MG 24 hr tablet Take 1 tablet (25 mg total) by mouth daily. 04/20/18  Yes Sagardia, Eilleen KempfMiguel Jose, MD  diphenhydrAMINE-zinc acetate (BENADRYL EXTRA STRENGTH) cream Apply 1 application topically 3 (three) times daily  as needed for itching. Patient not taking: Reported on 05/23/2018 04/20/18   [name], MD  hydrOXYzine (ATARAX/VISTARIL) 25 MG tablet Take 1 tablet (25 mg total) by mouth every 6 (six) hours as needed. Patient not taking: Reported on 05/23/2018 04/16/18   Tilden Fossa, MD  montelukast (SINGULAIR) 10 MG tablet Take 1 tablet (10 mg total) by mouth at bedtime. Patient not taking: Reported on 04/20/2018 12/09/17   [name], MD  nicotine (NICODERM CQ - DOSED IN MG/24 HOURS) 21 mg/24hr patch Place 1 patch (21 mg total) onto the skin daily. 04/20/18   [name], MD  ondansetron (ZOFRAN) 4 MG tablet Take 1 tablet (4 mg total) by mouth every 8 (eight) hours as needed for nausea or vomiting. 04/30/18   [name], MD     Depression screen [facility] 2/9 05/23/2018 04/20/2018 12/09/2017 10/14/2017 09/20/2017  Decreased Interest 0 0 0 0 3  Down, Depressed, Hopeless 0 0 0 0 3  PHQ - 2 Score 0 0 0 0 6  Altered sleeping - - - - 0  Tired, decreased energy - - - - 1  Change in appetite - - - - 0  Feeling bad or failure about yourself  - - - - 3  Trouble concentrating - - - - 0  Moving slowly or fidgety/restless - - - - 0  Suicidal thoughts - - - - 0  PHQ-9 Score - - - - 10  Difficult doing work/chores - - - - -     Fall Risk  05/23/2018 04/20/2018 12/09/2017 10/14/2017 09/20/2017  Falls in the past year? Yes Yes  Comment left leg gives out from an old work injury - - - -  Number falls in past yr: 0 0 0 2 or  more 1  Injury with Fall? 0 0 1 Yes No  Follow up - Falls evaluation completed - - -      PHYSICAL EXAM: Ht 5' 8.5" (1.74 m)   Wt 268 lb (121.6 kg)   BMI 40.16 kg/m    Wt Readings from Last 3 Encounters:  05/23/18 268 lb (121.6 kg)  04/20/18 268 lb 3.2 oz (121.7 kg)  04/16/18 250 lb (113.4 kg)     No exam data present    Physical Exam   Education/Counseling provided regarding diet and exercise, prevention of chronic diseases, smoking/tobacco cessation, if applicable, and reviewed "Covered Medicare Preventive Services."   ASSESSMENT/PLAN: There are no diagnoses linked to this encounter.   I have reviewed and agree with the above AWV documentation. Dr. Alvy Bimler

## 2018-06-28 DIAGNOSIS — E1169 Type 2 diabetes mellitus with other specified complication: Secondary | ICD-10-CM | POA: Diagnosis not present

## 2018-06-28 DIAGNOSIS — E1159 Type 2 diabetes mellitus with other circulatory complications: Secondary | ICD-10-CM | POA: Diagnosis not present

## 2018-06-29 ENCOUNTER — Telehealth: Payer: Self-pay | Admitting: Emergency Medicine

## 2018-06-29 NOTE — Telephone Encounter (Signed)
Please adv  Referral for 11/2017 for ENT but it was closed   Copied from CRM [id number]. Topic: General - Other >> Jun 29, 2018  8:40 AM Maye Hides wrote: Reason for CRM: Pt states she needs a back dated referral.She saw Dr Haroldine Laws 03/21/18 for sinus infection. She has [employer]

## 2018-06-29 NOTE — Telephone Encounter (Signed)
There is no where for me to send it because he closed his office for Good in April. I called the office and there is a recording stating this and the only number they give you is for billing and that is until 06/15

## 2018-06-29 NOTE — Telephone Encounter (Signed)
So what is it she wants [place] to do for her? Not sure how I can help.

## 2018-06-29 NOTE — Telephone Encounter (Signed)
Send new ENT referral please.

## 2018-06-29 NOTE — Telephone Encounter (Signed)
The provider she is requesting to send a back dated referral to the office has closed permanently as of 05/24/2018

## 2018-06-29 NOTE — Telephone Encounter (Signed)
I tried to call to explain to her that this practice is now closed , no answer. There is nothing more we can do  Thank you

## 2018-06-29 NOTE — Telephone Encounter (Signed)
Can we send a back dated referral for Dr. Haroldine Laws?

## 2018-06-30 ENCOUNTER — Encounter: Payer: Self-pay | Admitting: Emergency Medicine

## 2018-06-30 ENCOUNTER — Other Ambulatory Visit: Payer: Self-pay

## 2018-06-30 ENCOUNTER — Telehealth (INDEPENDENT_AMBULATORY_CARE_PROVIDER_SITE_OTHER): Payer: Medicare HMO | Admitting: Emergency Medicine

## 2018-06-30 VITALS — Ht 69.0 in | Wt 250.0 lb

## 2018-06-30 DIAGNOSIS — F418 Other specified anxiety disorders: Secondary | ICD-10-CM

## 2018-06-30 DIAGNOSIS — J0191 Acute recurrent sinusitis, unspecified: Secondary | ICD-10-CM

## 2018-06-30 MED ORDER — CLONAZEPAM 1 MG PO TABS: ORAL_TABLET | ORAL | 1 refills | Status: DC

## 2018-06-30 MED ORDER — DOXYCYCLINE HYCLATE 100 MG PO TABS: 100.0000 mg | ORAL_TABLET | Freq: Two times a day (BID) | ORAL | 0 refills | Status: DC

## 2018-06-30 MED ORDER — PREDNISONE 20 MG PO TABS: 40.0000 mg | ORAL_TABLET | Freq: Every day | ORAL | 0 refills | Status: AC

## 2018-06-30 NOTE — Progress Notes (Signed)
Called patient to triage for appointment. Patient states she is having nasal drainage for 2 weeks and it feels like a lump in her throat. Patient states she uses a mask to pollen out and it is not helping. Patient states she is coughing yellow mucus. Patient needs a refill on Clonazepam, she use it as needed not daily.

## 2018-06-30 NOTE — Progress Notes (Signed)
Telemedicine Encounter- SOAP NOTE Established Patient  This telephone encounter was conducted with the patient's (or proxy's) verbal consent via audio telecommunications: yes/no: Yes Patient was instructed to have this encounter in a suitably private space; and to only have persons present to whom they give permission to participate. In addition, patient identity was confirmed by use of name plus two identifiers (DOB and address).  I discussed the limitations, risks, security and privacy concerns of performing an evaluation and management service by telephone and the availability of in person appointments. I also discussed with the patient that there may be a patient responsible charge related to this service. The patient expressed understanding and agreed to proceed.  I spent a total of TIME; 0 MIN TO 60 MIN: 15 minutes talking with the patient or their proxy.  No chief complaint on file. Sinus congestion with discharge  Subjective   Jasmine Long is a 68 y.o. female established patient. Telephone visit today complaining of sinus congestion and drainage worsened by seasonal allergies.  Has a history of chronic recurrent sinus infections that usually respond to antibiotic and prednisone.  Has trouble clearing her throat and is coughing.  Denies fever or chills.  Denies any other significant symptoms.  HPI   Patient Active Problem List   Diagnosis Date Noted  . History of peptic ulcer 04/30/2017  . Hyperlipidemia 12/11/2015  . Insomnia 12/11/2015  . Panic attack as reaction to stress 12/11/2015  . Tobacco abuse 12/11/2015  . Chronic sinusitis 12/11/2015  . Diabetes (HCC) 05/15/2015  . Hypothyroid 04/29/2011  . BMI 40.0-44.9, adult (HCC) 04/29/2011  . Depression with anxiety 04/29/2011  . GERD (gastroesophageal reflux disease) 04/29/2011  . 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  . Nicotine addiction 04/29/2011  . Hypertension 04/29/2011  . Murmur, cardiac 04/29/2011    Past Medical  History:  Diagnosis Date  . Allergy   . Anxiety   . Depression   . Heart murmur   . Hypertension   . Thyroid disease   . Ulcer     Current Outpatient Medications  Medication Sig Dispense Refill  . ALPRAZolam (XANAX) 0.5 MG tablet Take 1 tablet (0.5 mg total) by mouth daily as needed for anxiety. 60 tablet 0  . atorvastatin (LIPITOR) 20 MG tablet Take 1 tablet (20 mg total) by mouth daily. 90 tablet 3  . buPROPion (WELLBUTRIN XL) 300 MG 24 hr tablet Take 1 tablet (300 mg total) by mouth daily. 90 tablet 1  . clonazePAM (KLONOPIN) 1 MG tablet TAKE 1 TABLET BY MOUTH AT BEDTIME FOR SLEEP OR ANXIETY 30 tablet 1  . levothyroxine (SYNTHROID, LEVOTHROID) 125 MCG tablet Take 1 tablet (125 mcg total) by mouth daily before breakfast. 90 tablet 1  . losartan (COZAAR) 100 MG tablet Take 1 tablet (100 mg total) by mouth daily. 90 tablet 1  . metFORMIN (GLUCOPHAGE) 1000 MG tablet TAKE 1 TABLET BY MOUTH 2 TIMES DAILY WITH MEALS 180 tablet 3  . metoprolol succinate (TOPROL XL) 25 MG 24 hr tablet Take 1 tablet (25 mg total) by mouth daily. 90 tablet 3  . nicotine (NICODERM CQ - DOSED IN MG/24 HOURS) 21 mg/24hr patch Place 1 patch (21 mg total) onto the skin daily. 28 patch 3  . azelastine (ASTELIN) 0.1 % nasal spray Place 2 sprays into both nostrils 2 (two) times daily. For sinus congestion from allergies (Patient not taking: Reported on 06/30/2018) 30 mL 2  . diphenhydrAMINE-zinc acetate (BENADRYL EXTRA STRENGTH) cream Apply 1 application topically 3 (  three) times daily as needed for itching. (Patient not taking: Reported on 06/30/2018) 28.4 g 2  . hydrOXYzine (ATARAX/VISTARIL) 25 MG tablet Take 1 tablet (25 mg total) by mouth every 6 (six) hours as needed. (Patient not taking: Reported on 06/30/2018) 12 tablet 0  . montelukast (SINGULAIR) 10 MG tablet Take 1 tablet (10 mg total) by mouth at bedtime. (Patient not taking: Reported on 06/30/2018) 30 tablet 3  . ondansetron (ZOFRAN) 4 MG tablet Take 1 tablet (4 mg  total) by mouth every 8 (eight) hours as needed for nausea or vomiting. (Patient not taking: Reported on 06/30/2018) 20 tablet 0   No current facility-administered medications for this visit.     No Known Allergies  Social History   Socioeconomic History  . Marital status: Divorced    Spouse name: Not on file  . Number of children: Not on file  . Years of education: Not on file  . Highest education level: Not on file  Occupational History  . Occupation: Unemployed  Social Needs  . Financial resource strain: Not on file  . Food insecurity:    Worry: Not on file    Inability: Not on file  . Transportation needs:    Medical: Not on file    Non-medical: Not on file  Tobacco Use  . Smoking status: Current Every Day Smoker    Packs/day: 1.50    Years: 40.00    Pack years: 60.00    Types: Cigarettes    Last attempt to quit: 01/20/2012    Years since quitting: 6.4  . Smokeless tobacco: Never Used  . Tobacco comment: Pt doing E-cigarette as of 01/20/12  Substance and Sexual Activity  . Alcohol use: No  . Drug use: No  . Sexual activity: Never  Lifestyle  . Physical activity:    Days per week: Not on file    Minutes per session: Not on file  . Stress: Not on file  Relationships  . Social connections:    Talks on phone: Not on file    Gets together: Not on file    Attends religious service: Not on file    Active member of club or organization: Not on file    Attends meetings of clubs or organizations: Not on file    Relationship status: Not on file  . Intimate partner violence:    Fear of current or ex partner: Not on file    Emotionally abused: Not on file    Physically abused: Not on file    Forced sexual activity: Not on file  Other Topics Concern  . Not on file  Social History Narrative   Patient on worker's compensation for an injury sustained on the job 5 years ago.    Review of Systems  Constitutional: Negative.  Negative for chills and fever.  HENT:  Positive for congestion and sinus pain. Negative for sore throat.   Respiratory: Positive for cough.   Cardiovascular: Negative.  Negative for chest pain and palpitations.  Gastrointestinal: Negative for abdominal pain, diarrhea, nausea and vomiting.  Musculoskeletal: Negative for myalgias.  Skin: Negative.  Negative for rash.  Neurological: Negative for dizziness and headaches.  All other systems reviewed and are negative.   Objective   Vitals as reported by the patient: Today's Vitals   06/30/18 1129  Weight: 250 lb (113.4 kg)  Height: 5\' 9"  (1.753 m)  Awake and oriented x3 in no apparent respiratory distress.  There are no diagnoses linked to this encounter.  Diagnoses and all orders for this visit:  Acute recurrent sinusitis, unspecified location -     doxycycline (VIBRA-TABS) 100 MG tablet; Take 1 tablet (100 mg total) by mouth 2 (two) times daily. -     predniSONE (DELTASONE) 20 MG tablet; Take 2 tablets (40 mg total) by mouth daily with breakfast for 5 days.  Depression with anxiety -     clonazePAM (KLONOPIN) 1 MG tablet; TAKE 1 TABLET BY MOUTH AT BEDTIME FOR SLEEP OR ANXIETY    Clinically stable.  Will treat acute sinusitis. Contact the office if no better in 1 week.  I discussed the assessment and treatment plan with the patient. The patient was provided an opportunity to ask questions and all were answered. The patient agreed with the plan and demonstrated an understanding of the instructions.   The patient was advised to call back or seek an in-person evaluation if the symptoms worsen or if the condition fails to improve as anticipated.  I provided 15 minutes of non-face-to-face time during this encounter.  [name], MD  Primary Care at [facility]

## 2018-07-27 ENCOUNTER — Other Ambulatory Visit: Payer: Self-pay | Admitting: Emergency Medicine

## 2018-07-27 DIAGNOSIS — I10 Essential (primary) hypertension: Secondary | ICD-10-CM

## 2018-07-27 NOTE — Telephone Encounter (Signed)
Please see note from pharmacy

## 2018-08-05 NOTE — Telephone Encounter (Signed)
Sent in a new prescription dose of 50 mg tab of Losartan, do to the pharmacy being on back order. Refilled for 180 days no refills.

## 2018-09-01 ENCOUNTER — Telehealth: Payer: Self-pay | Admitting: Emergency Medicine

## 2018-09-01 NOTE — Telephone Encounter (Signed)
Patient would like a phone call from Provider to discuss anxiety and bp

## 2018-09-02 NOTE — Telephone Encounter (Signed)
Appointment has been made for 09/05/2018

## 2018-09-05 ENCOUNTER — Ambulatory Visit (INDEPENDENT_AMBULATORY_CARE_PROVIDER_SITE_OTHER): Payer: Medicare HMO | Admitting: Emergency Medicine

## 2018-09-05 ENCOUNTER — Other Ambulatory Visit: Payer: Self-pay

## 2018-09-05 ENCOUNTER — Encounter: Payer: Self-pay | Admitting: Emergency Medicine

## 2018-09-05 VITALS — BP 135/77 | HR 94 | Temp 98.0°F | Resp 16 | Ht 67.52 in | Wt 254.0 lb

## 2018-09-05 DIAGNOSIS — F418 Other specified anxiety disorders: Secondary | ICD-10-CM

## 2018-09-05 DIAGNOSIS — F4323 Adjustment disorder with mixed anxiety and depressed mood: Secondary | ICD-10-CM

## 2018-09-05 MED ORDER — ALPRAZOLAM 0.5 MG PO TABS: 0.5000 mg | ORAL_TABLET | Freq: Every day | ORAL | 1 refills | Status: DC | PRN

## 2018-09-05 NOTE — Progress Notes (Signed)
Jasmine Long 68 y.o.   Chief Complaint  Patient presents with   Chronic Conditions    2 month follow-up    Anxiety    need to increase her medication due to increase anxiety    Sinus Problem    has increased     HISTORY OF PRESENT ILLNESS: This is a 68 y.o. female complaining of increased anxiety for the past 2 months due to her son's psychiatric condition.  Has had to use alprazolam much more frequently than usually.  Patient has had troubles with anxiety since the murder of his son in 2011.  Was treated by Dr. Merla Richesoolittle for a while.  Has never seen a psychiatrist about this.  Taking Wellbutrin for depression.  Takes Klonopin at bedtime.  Takes alprazolam only as needed. Also has a chronic history of sinus congestion.  Denies purulent drainage, fever, or chills. Also has a history of chronic insomnia.  Tried multiple medications in the past. HPI   Prior to Admission medications   Medication Sig Start Date End Date Taking? Authorizing Provider  ALPRAZolam Prudy Feeler(XANAX) 0.5 MG tablet Take 1 tablet (0.5 mg total) by mouth daily as needed for anxiety. 04/20/18  Yes Georgina QuintSagardia, Rosaline Ezekiel Jose, MD  buPROPion (WELLBUTRIN XL) 300 MG 24 hr tablet Take 1 tablet (300 mg total) by mouth daily. 04/20/18  Yes Rubie Ficco, Eilleen KempfMiguel Jose, MD  clonazePAM (KLONOPIN) 1 MG tablet TAKE 1 TABLET BY MOUTH AT BEDTIME FOR SLEEP OR ANXIETY 06/30/18  Yes Arlyn Buerkle, Eilleen KempfMiguel Jose, MD  diphenhydrAMINE-zinc acetate (BENADRYL EXTRA STRENGTH) cream Apply 1 application topically 3 (three) times daily as needed for itching. 04/20/18  Yes Willadean Guyton, Eilleen KempfMiguel Jose, MD  doxycycline (VIBRA-TABS) 100 MG tablet Take 1 tablet (100 mg total) by mouth 2 (two) times daily. 06/30/18  Yes Hailynn Slovacek, Eilleen KempfMiguel Jose, MD  hydrOXYzine (ATARAX/VISTARIL) 25 MG tablet Take 1 tablet (25 mg total) by mouth every 6 (six) hours as needed. 04/16/18  Yes Tilden Fossaees, Elizabeth, MD  levothyroxine (SYNTHROID, LEVOTHROID) 125 MCG tablet Take 1 tablet (125 mcg total) by mouth daily  before breakfast. 04/20/18  Yes Jagar Lua, Eilleen KempfMiguel Jose, MD  losartan (COZAAR) 50 MG tablet Take 1 tablet (50 mg total) by mouth 2 (two) times a day. 08/05/18  Yes Brendin Situ, Eilleen KempfMiguel Jose, MD  metFORMIN (GLUCOPHAGE) 1000 MG tablet TAKE 1 TABLET BY MOUTH 2 TIMES DAILY WITH MEALS 04/20/18  Yes Deshanti Adcox, Eilleen KempfMiguel Jose, MD  metoprolol succinate (TOPROL XL) 25 MG 24 hr tablet Take 1 tablet (25 mg total) by mouth daily. 04/20/18  Yes Latiya Navia, Eilleen KempfMiguel Jose, MD  montelukast (SINGULAIR) 10 MG tablet Take 1 tablet (10 mg total) by mouth at bedtime. 12/09/17  Yes Tikesha Mort, Eilleen KempfMiguel Jose, MD  nicotine (NICODERM CQ - DOSED IN MG/24 HOURS) 21 mg/24hr patch Place 1 patch (21 mg total) onto the skin daily. 04/20/18  Yes Raife Lizer, Eilleen KempfMiguel Jose, MD  ondansetron (ZOFRAN) 4 MG tablet Take 1 tablet (4 mg total) by mouth every 8 (eight) hours as needed for nausea or vomiting. 04/30/18  Yes Daily Crate, Eilleen KempfMiguel Jose, MD  atorvastatin (LIPITOR) 20 MG tablet Take 1 tablet (20 mg total) by mouth daily. 04/20/18 07/19/18  Georgina QuintSagardia, Viveca Beckstrom Jose, MD    No Known Allergies  Patient Active Problem List   Diagnosis Date Noted   History of peptic ulcer 04/30/2017   Hyperlipidemia 12/11/2015   Insomnia 12/11/2015   Panic attack as reaction to stress 12/11/2015   Tobacco abuse 12/11/2015   Chronic sinusitis 12/11/2015   Diabetes (HCC) 05/15/2015   Hypothyroid  04/29/2011   BMI 40.0-44.9, adult (HCC) 04/29/2011   Depression with anxiety 04/29/2011   GERD (gastroesophageal reflux disease) 04/29/2011   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   Nicotine addiction 04/29/2011   Hypertension 04/29/2011   Murmur, cardiac 04/29/2011    Past Medical History:  Diagnosis Date   Allergy    Anxiety    Depression    Heart murmur    Hypertension    Thyroid disease    Ulcer     Past Surgical History:  Procedure Laterality Date   BREAST SURGERY     CESAREAN SECTION     CHOLECYSTECTOMY     NASAL SEPTUM SURGERY      Social  History   Socioeconomic History   Marital status: Divorced    Spouse name: Not on file   Number of children: Not on file   Years of education: Not on file   Highest education level: Not on file  Occupational History   Occupation: Unemployed  [employer]   Financial resource strain: Not on file   Food insecurity    Worry: Not on file    Inability: Not on file   Transportation needs    Medical: Not on file    Non-medical: Not on file  Tobacco Use   Smoking status: Current Every Day Smoker    Packs/day: 1.50    Years: 40.00    Pack years: 60.00    Types: Cigarettes    Last attempt to quit: 01/20/2012    Years since quitting: 6.6   Smokeless tobacco: Never Used   Tobacco comment: Pt doing E-cigarette as of 01/20/12  Substance and Sexual Activity   Alcohol use: No   Drug use: No   Sexual activity: Never  Lifestyle   Physical activity    Days per week: Not on file    Minutes per session: Not on file   Stress: Not on file  Relationships   Social connections    Talks on phone: Not on file    Gets together: Not on file    Attends religious service: Not on file    Active member of club or organization: Not on file    Attends meetings of clubs or organizations: Not on file    Relationship status: Not on file   Intimate partner violence    Fear of current or ex partner: Not on file    Emotionally abused: Not on file    Physically abused: Not on file    Forced sexual activity: Not on file  Other Topics Concern   Not on file  Social History Narrative   Patient on worker's compensation for an injury sustained on the job 5 years ago.    Family History  Problem Relation Age of Onset   Lung cancer Mother    Dementia Father    Atrial fibrillation Father      Review of Systems  Constitutional: Negative.  Negative for chills and fever.  HENT: Positive for congestion. Negative for ear pain and sore throat.   Eyes: Negative.   Respiratory: Negative.   Negative for cough and shortness of breath.   Cardiovascular: Negative.  Negative for chest pain and palpitations.  Gastrointestinal: Negative for abdominal pain, diarrhea, nausea and vomiting.  Genitourinary: Negative.   Musculoskeletal: Negative.  Negative for myalgias.  Skin: Negative.  Negative for rash.  Neurological: Negative for dizziness.  Psychiatric/Behavioral: Positive for depression. The patient is nervous/anxious and has insomnia.   All other systems reviewed and are  negative.    Physical Exam Vitals signs reviewed.  Constitutional:      Appearance: Normal appearance.  HENT:     Head: Normocephalic.  Eyes:     Extraocular Movements: Extraocular movements intact.     Pupils: Pupils are equal, round, and reactive to light.  Neck:     Musculoskeletal: Normal range of motion and neck supple.  Cardiovascular:     Rate and Rhythm: Normal rate and regular rhythm.     Heart sounds: Normal heart sounds.  Pulmonary:     Effort: Pulmonary effort is normal.     Breath sounds: Normal breath sounds.  Musculoskeletal: Normal range of motion.  Skin:    General: Skin is warm and dry.     Capillary Refill: Capillary refill takes less than 2 seconds.  Neurological:     General: No focal deficit present.     Mental Status: She is alert and oriented to person, place, and time.  Psychiatric:        Mood and Affect: Mood normal.        Behavior: Behavior normal.      ASSESSMENT & PLAN: Jasmine Long was seen today for chronic conditions, anxiety and sinus problem.  Diagnoses and all orders for this visit:  Situational mixed anxiety and depressive disorder -     ALPRAZolam (XANAX) 0.5 MG tablet; Take 1 tablet (0.5 mg total) by mouth daily as needed for anxiety.  Depression with anxiety -     ALPRAZolam (XANAX) 0.5 MG tablet; Take 1 tablet (0.5 mg total) by mouth daily as needed for anxiety. -     Ambulatory referral to Psychiatry    Patient Instructions       If you have  lab work done today you will be contacted with your lab results within the next 2 weeks.  If you have not heard from [place] then please contact us. The fastest way to get your results is to register for My Chart.   IF you received an x-ray today, you will receive an invoice from [facility]. Please contact [facility] at [phone] with questions or concerns regarding your invoice.   IF you received labwork today, you will receive an invoice from [facility]. Please contact [facility] at [phone] with questions or concerns regarding your invoice.   Our billing staff will not be able to assist you with questions regarding bills from these companies.  You will be contacted with the lab results as soon as they are available. The fastest way to get your results is to activate your My Chart account. Instructions are located on the last page of this paperwork. If you have not heard from [place] regarding the results in 2 weeks, please contact this office.      Living With Anxiety  After being diagnosed with an anxiety disorder, you may be relieved to know why you have felt or behaved a certain way. It is natural to also feel overwhelmed about the treatment ahead and what it will mean for your life. With care and support, you can manage this condition and recover from it. How to cope with anxiety Dealing with stress Stress is your bodys reaction to life changes and events, both good and bad. Stress can last just a few hours or it can be ongoing. Stress can play a major role in anxiety, so it is important to learn both how to cope with stress and how to think about it differently. Talk with your health care provider or  a counselor to learn more about stress reduction. He or she may suggest some stress reduction techniques, such as:  Music therapy. This can include creating or listening to music that you enjoy and that inspires you.  Mindfulness-based meditation. This involves being aware  of your normal breaths, rather than trying to control your breathing. It can be done while sitting or walking.  Centering prayer. This is a kind of meditation that involves focusing on a word, phrase, or sacred image that is meaningful to you and that brings you peace.  Deep breathing. To do this, expand your stomach and inhale slowly through your nose. Hold your breath for 3-5 seconds. Then exhale slowly, allowing your stomach muscles to relax.  Self-talk. This is a skill where you identify thought patterns that lead to anxiety reactions and correct those thoughts.  Muscle relaxation. This involves tensing muscles then relaxing them. Choose a stress reduction technique that fits your lifestyle and personality. Stress reduction techniques take time and practice. Set aside 5-15 minutes a day to do them. Therapists can offer training in these techniques. The training may be covered by some insurance plans. Other things you can do to manage stress include:  Keeping a stress diary. This can help you learn what triggers your stress and ways to control your response.  Thinking about how you respond to certain situations. You may not be able to control everything, but you can control your reaction.  Making time for activities that help you relax, and not feeling guilty about spending your time in this way. Therapy combined with coping and stress-reduction skills provides the best chance for successful treatment. Medicines Medicines can help ease symptoms. Medicines for anxiety include:  Anti-anxiety drugs.  Antidepressants.  Beta-blockers. Medicines may be used as the main treatment for anxiety disorder, along with therapy, or if other treatments are not working. Medicines should be prescribed by a health care provider. Relationships Relationships can play a big part in helping you recover. Try to spend more time connecting with trusted friends and family members. Consider going to couples  counseling, taking family education classes, or going to family therapy. Therapy can help you and others better understand the condition. How to recognize changes in your condition Everyone has a different response to treatment for anxiety. Recovery from anxiety happens when symptoms decrease and stop interfering with your daily activities at home or work. This may mean that you will start to:  Have better concentration and focus.  Sleep better.  Be less irritable.  Have more energy.  Have improved memory. It is important to recognize when your condition is getting worse. Contact your health care provider if your symptoms interfere with home or work and you do not feel like your condition is improving. Where to find help and support: You can get help and support from these sources:  Self-help groups.  Online and [employer].  A trusted spiritual leader.  Couples counseling.  Family education classes.  Family therapy. Follow these instructions at home:  Eat a healthy diet that includes plenty of vegetables, fruits, whole grains, low-fat dairy products, and lean protein. Do not eat a lot of foods that are high in solid fats, added sugars, or salt.  Exercise. Most adults should do the following: ? Exercise for at least 150 minutes each week. The exercise should increase your heart rate and make you sweat (moderate-intensity exercise). ? Strengthening exercises at least twice a week.  Cut down on caffeine, tobacco, alcohol, and other  potentially harmful substances.  Get the right amount and quality of sleep. Most adults need 7-9 hours of sleep each night.  Make choices that simplify your life.  Take over-the-counter and prescription medicines only as told by your health care provider.  Avoid caffeine, alcohol, and certain over-the-counter cold medicines. These may make you feel worse. Ask your pharmacist which medicines to avoid.  Keep all follow-up visits as  told by your health care provider. This is important. Questions to ask your health care provider  Would I benefit from therapy?  How often should I follow up with a health care provider?  How long do I need to take medicine?  Are there any long-term side effects of my medicine?  Are there any alternatives to taking medicine? Contact a health care provider if:  You have a hard time staying focused or finishing daily tasks.  You spend many hours a day feeling worried about everyday life.  You become exhausted by worry.  You start to have headaches, feel tense, or have nausea.  You urinate more than normal.  You have diarrhea. Get help right away if:  You have a racing heart and shortness of breath.  You have thoughts of hurting yourself or others. If you ever feel like you may hurt yourself or others, or have thoughts about taking your own life, get help right away. You can go to your nearest emergency department or call:  Your local emergency services (911 in the [place]  A suicide crisis helpline, such as the National Suicide Prevention Lifeline at 440-753-56091-321-373-2468. This is open 24-hours a day. Summary  Taking steps to deal with stress can help calm you.  Medicines cannot cure anxiety disorders, but they can help ease symptoms.  Family, friends, and partners can play a big part in helping you recover from an anxiety disorder. This information is not intended to replace advice given to you by your health care provider. Make sure you discuss any questions you have with your health care provider. Document Released: 01/07/2016 Document Revised: 12/25/2016 Document Reviewed: 01/07/2016 Elsevier Patient Education  2020 [employer]      Edwina BarthMiguel Ronnisha Felber, MD [facility]

## 2018-09-05 NOTE — Patient Instructions (Addendum)
   If you have lab work done today you will be contacted with your lab results within the next 2 weeks.  If you have not heard from us then please contact us. The fastest way to get your results is to register for My Chart.   IF you received an x-ray today, you will receive an invoice from [facility]. Please contact [facility] at [phone] with questions or concerns regarding your invoice.   IF you received labwork today, you will receive an invoice from [facility]. Please contact [facility] at [phone] with questions or concerns regarding your invoice.   Our billing staff will not be able to assist you with questions regarding bills from these companies.  You will be contacted with the lab results as soon as they are available. The fastest way to get your results is to activate your My Chart account. Instructions are located on the last page of this paperwork. If you have not heard from us regarding the results in 2 weeks, please contact this office.     Living With Anxiety  After being diagnosed with an anxiety disorder, you may be relieved to know why you have felt or behaved a certain way. It is natural to also feel overwhelmed about the treatment ahead and what it will mean for your life. With care and support, you can manage this condition and recover from it. How to cope with anxiety Dealing with stress Stress is your body's reaction to life changes and events, both good and bad. Stress can last just a few hours or it can be ongoing. Stress can play a major role in anxiety, so it is important to learn both how to cope with stress and how to think about it differently. Talk with your health care provider or a counselor to learn more about stress reduction. He or she may suggest some stress reduction techniques, such as:  Music therapy. This can include creating or listening to music that you enjoy and that inspires you.  Mindfulness-based meditation. This  involves being aware of your normal breaths, rather than trying to control your breathing. It can be done while sitting or walking.  Centering prayer. This is a kind of meditation that involves focusing on a word, phrase, or sacred image that is meaningful to you and that brings you peace.  Deep breathing. To do this, expand your stomach and inhale slowly through your nose. Hold your breath for 3-5 seconds. Then exhale slowly, allowing your stomach muscles to relax.  Self-talk. This is a skill where you identify thought patterns that lead to anxiety reactions and correct those thoughts.  Muscle relaxation. This involves tensing muscles then relaxing them. Choose a stress reduction technique that fits your lifestyle and personality. Stress reduction techniques take time and practice. Set aside 5-15 minutes a day to do them. Therapists can offer training in these techniques. The training may be covered by some insurance plans. Other things you can do to manage stress include:  Keeping a stress diary. This can help you learn what triggers your stress and ways to control your response.  Thinking about how you respond to certain situations. You may not be able to control everything, but you can control your reaction.  Making time for activities that help you relax, and not feeling guilty about spending your time in this way. Therapy combined with coping and stress-reduction skills provides the best chance for successful treatment. Medicines Medicines can help ease symptoms. Medicines for anxiety include:    Anti-anxiety drugs.  Antidepressants.  Beta-blockers. Medicines may be used as the main treatment for anxiety disorder, along with therapy, or if other treatments are not working. Medicines should be prescribed by a health care provider. Relationships Relationships can play a big part in helping you recover. Try to spend more time connecting with trusted friends and family members. Consider  going to couples counseling, taking family education classes, or going to family therapy. Therapy can help you and others better understand the condition. How to recognize changes in your condition Everyone has a different response to treatment for anxiety. Recovery from anxiety happens when symptoms decrease and stop interfering with your daily activities at home or work. This may mean that you will start to:  Have better concentration and focus.  Sleep better.  Be less irritable.  Have more energy.  Have improved memory. It is important to recognize when your condition is getting worse. Contact your health care provider if your symptoms interfere with home or work and you do not feel like your condition is improving. Where to find help and support: You can get help and support from these sources:  Self-help groups.  Online and community organizations.  A trusted spiritual leader.  Couples counseling.  Family education classes.  Family therapy. Follow these instructions at home:  Eat a healthy diet that includes plenty of vegetables, fruits, whole grains, low-fat dairy products, and lean protein. Do not eat a lot of foods that are high in solid fats, added sugars, or salt.  Exercise. Most adults should do the following: ? Exercise for at least 150 minutes each week. The exercise should increase your heart rate and make you sweat (moderate-intensity exercise). ? Strengthening exercises at least twice a week.  Cut down on caffeine, tobacco, alcohol, and other potentially harmful substances.  Get the right amount and quality of sleep. Most adults need 7-9 hours of sleep each night.  Make choices that simplify your life.  Take over-the-counter and prescription medicines only as told by your health care provider.  Avoid caffeine, alcohol, and certain over-the-counter cold medicines. These may make you feel worse. Ask your pharmacist which medicines to avoid.  Keep all  follow-up visits as told by your health care provider. This is important. Questions to ask your health care provider  Would I benefit from therapy?  How often should I follow up with a health care provider?  How long do I need to take medicine?  Are there any long-term side effects of my medicine?  Are there any alternatives to taking medicine? Contact a health care provider if:  You have a hard time staying focused or finishing daily tasks.  You spend many hours a day feeling worried about everyday life.  You become exhausted by worry.  You start to have headaches, feel tense, or have nausea.  You urinate more than normal.  You have diarrhea. Get help right away if:  You have a racing heart and shortness of breath.  You have thoughts of hurting yourself or others. If you ever feel like you may hurt yourself or others, or have thoughts about taking your own life, get help right away. You can go to your nearest emergency department or call:  Your local emergency services (911 in the [place]  A suicide crisis helpline, such as the National Suicide Prevention Lifeline at 1-800-273-8255. This is open 24-hours a day. Summary  Taking steps to deal with stress can help calm you.  Medicines cannot cure anxiety disorders,   but they can help ease symptoms.  Family, friends, and partners can play a big part in helping you recover from an anxiety disorder. This information is not intended to replace advice given to you by your health care provider. Make sure you discuss any questions you have with your health care provider. Document Released: 01/07/2016 Document Revised: 12/25/2016 Document Reviewed: 01/07/2016 Elsevier Patient Education  2020 Elsevier Inc.  

## 2018-09-19 ENCOUNTER — Telehealth: Payer: Self-pay | Admitting: Emergency Medicine

## 2018-09-19 NOTE — Telephone Encounter (Signed)
Copied from [place]. Topic: General - Other >> Sep 19, 2018  1:25 PM Alanda Slim E wrote: Reason for CRM: Pt called about medication for her sinus infection. She received Rx for Alprazolam but nothing for her sinuses. Pease advise asap

## 2018-09-20 ENCOUNTER — Other Ambulatory Visit: Payer: Self-pay | Admitting: Emergency Medicine

## 2018-09-20 MED ORDER — AZITHROMYCIN 250 MG PO TABS: ORAL_TABLET | ORAL | 0 refills | Status: DC

## 2018-09-20 NOTE — Telephone Encounter (Signed)
Spoke with pt and she states her sinues has been giving her a hard time and would like to know if you can send her in an antibiotic to clear it up?

## 2018-09-20 NOTE — Telephone Encounter (Signed)
Pt informed

## 2018-09-20 NOTE — Telephone Encounter (Signed)
Z-pak sent.  Thanks!

## 2018-10-31 IMAGING — DX DG WRIST COMPLETE 3+V*R*
4 series · 4 of 4 positions shown · non-contrast
Comparison: None.

CLINICAL DATA: Right wrist pain and swelling since 09/16/2017 when
the patient suffered an injury due to her legs giving way. Initial
encounter.

EXAM:
RIGHT WRIST - COMPLETE 3+ VIEW

[wrist pa]
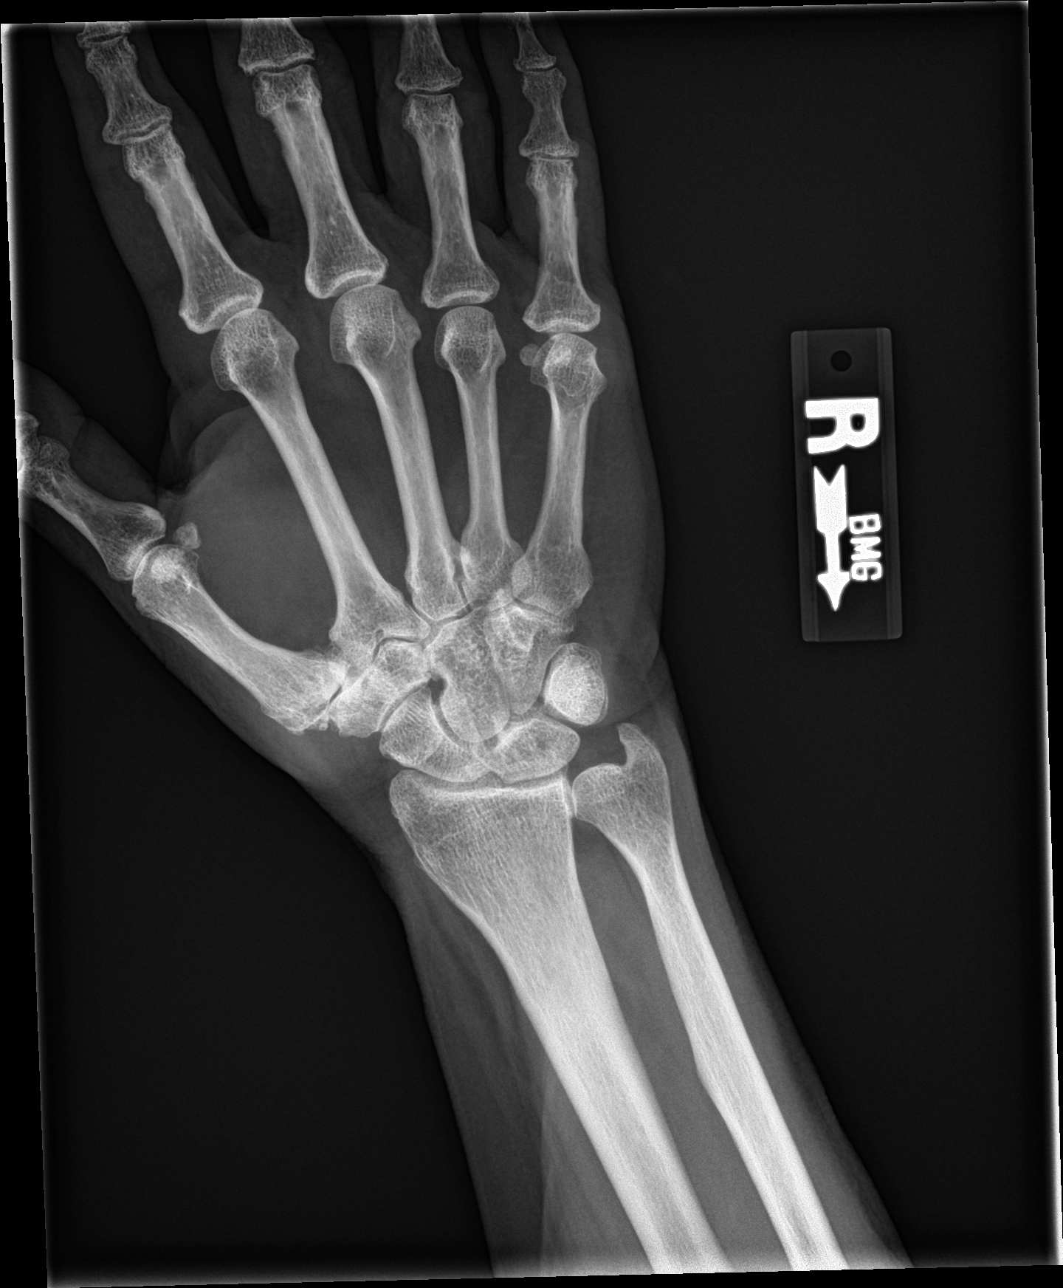

[wrist obl]
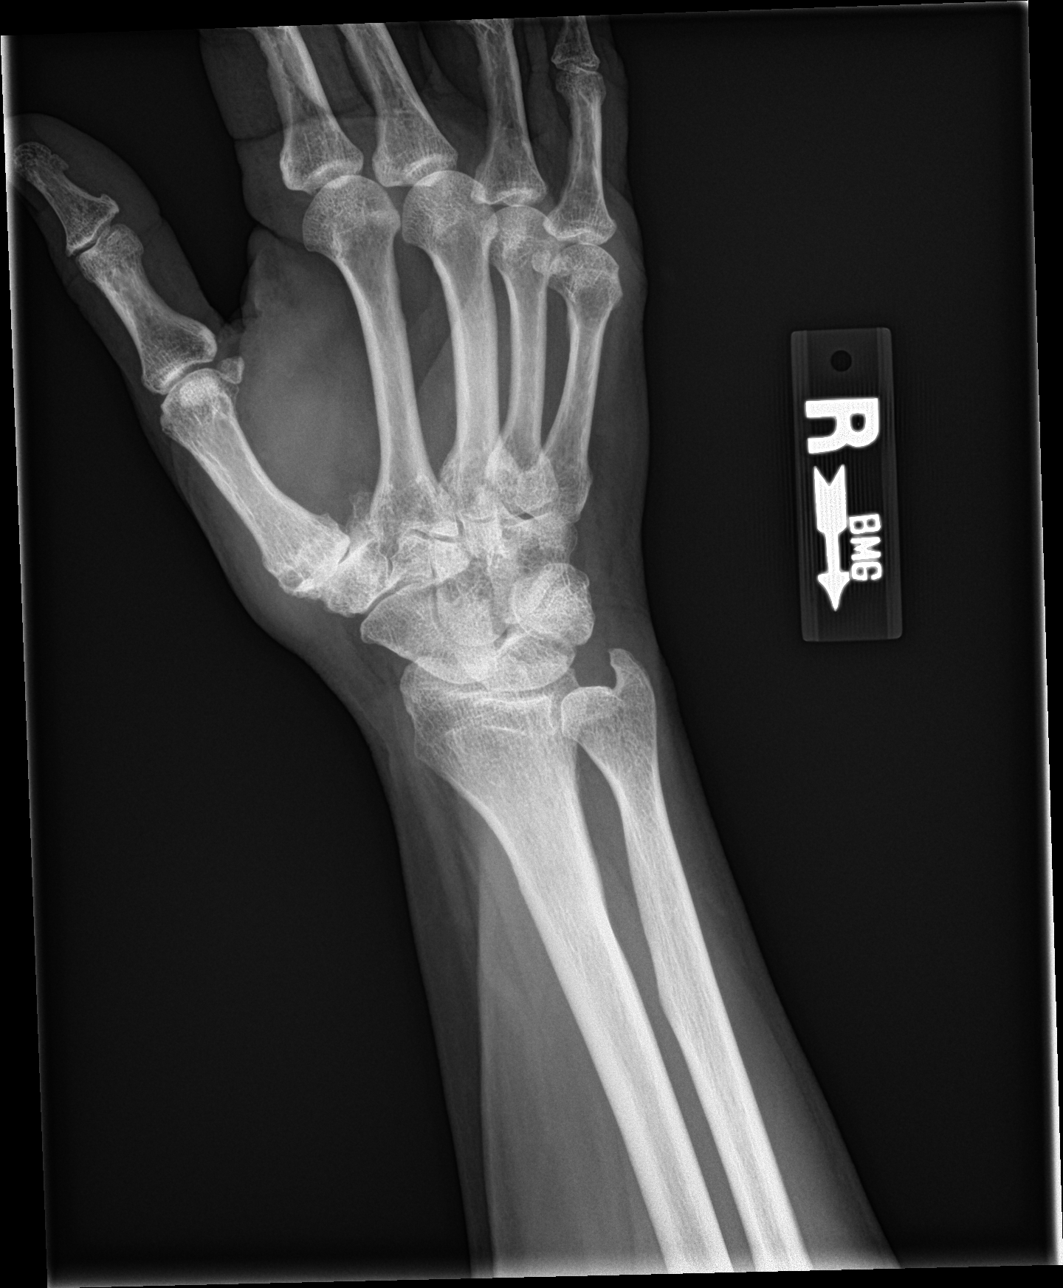

[wrist lat]
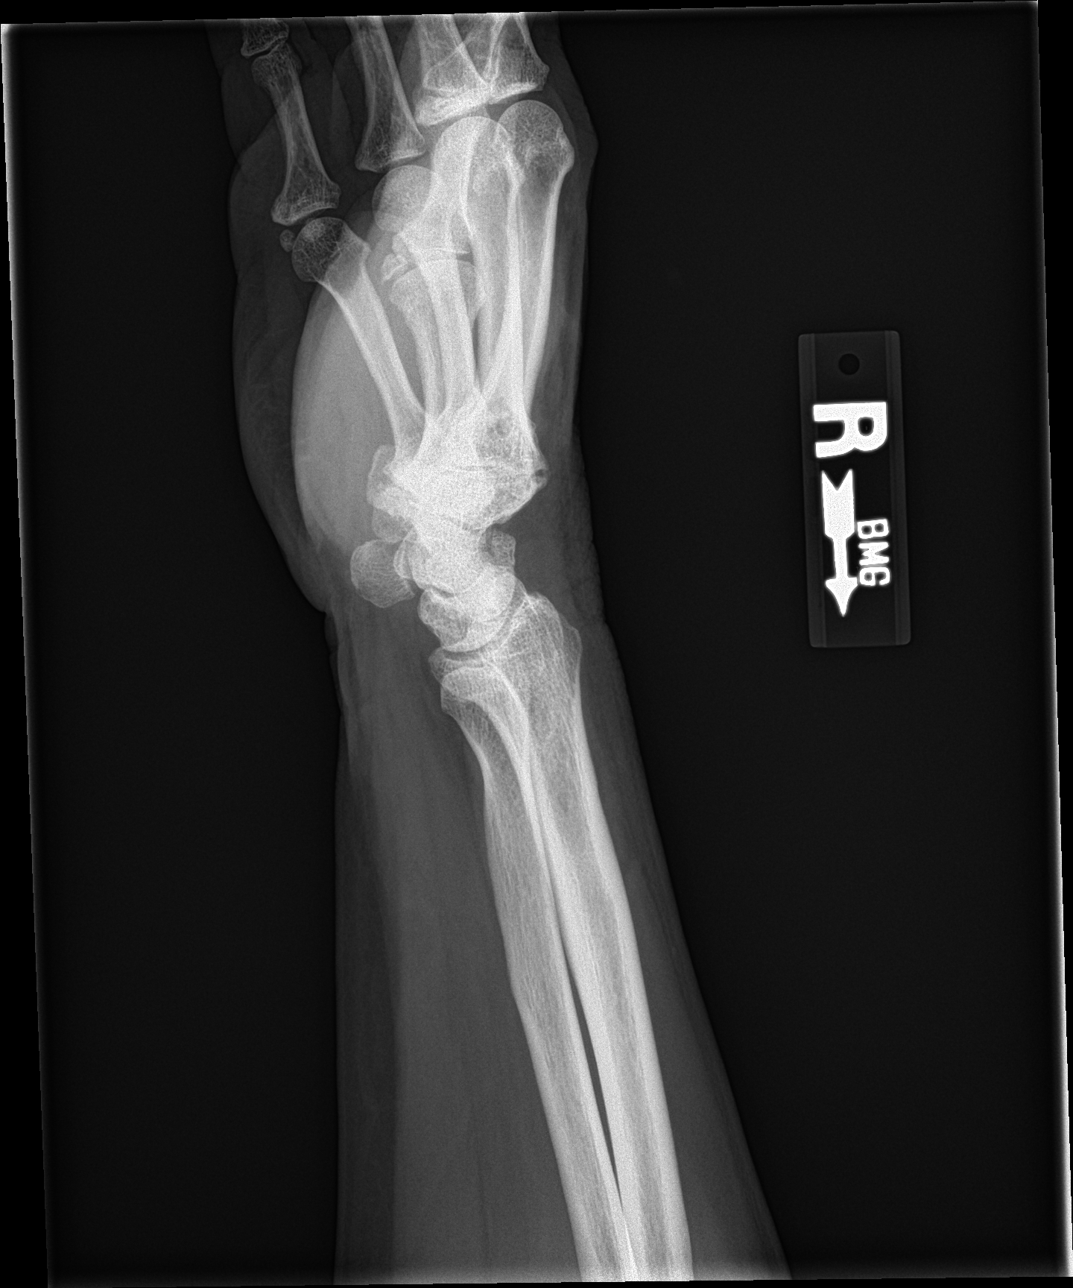

[wrist navicular]
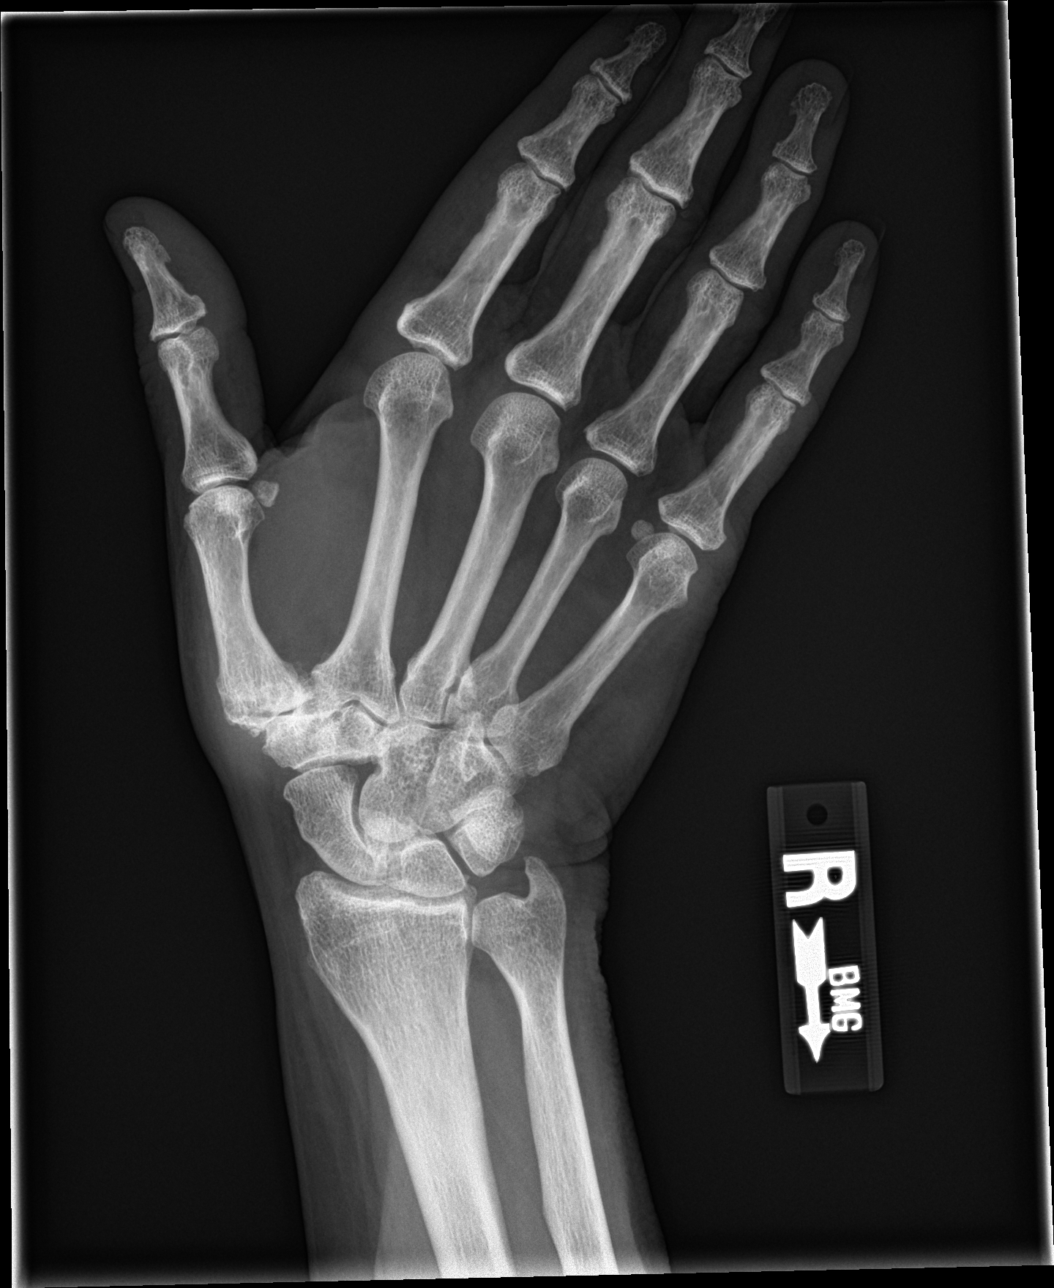

[4 of 4 positions shown; findings below may reference images not displayed]

FINDINGS: No acute bony or joint abnormality is identified. Advanced [facility]
osteoarthritis is noted. Soft tissues appear normal.
IMPRESSION: No acute abnormality.

[facility] osteoarthritis.

## 2018-11-18 ENCOUNTER — Other Ambulatory Visit: Payer: Self-pay | Admitting: Emergency Medicine

## 2018-11-18 DIAGNOSIS — I10 Essential (primary) hypertension: Secondary | ICD-10-CM

## 2018-11-18 DIAGNOSIS — E079 Disorder of thyroid, unspecified: Secondary | ICD-10-CM

## 2018-11-18 DIAGNOSIS — F418 Other specified anxiety disorders: Secondary | ICD-10-CM

## 2018-11-25 ENCOUNTER — Telehealth: Payer: Self-pay | Admitting: Emergency Medicine

## 2019-01-11 ENCOUNTER — Telehealth: Payer: Self-pay | Admitting: Emergency Medicine

## 2019-01-11 NOTE — Telephone Encounter (Signed)
Drainage, mucus is clear, under eyes puffy   Requesting antibiotics for sinus infection, c paps   her ear,nose,throat specialist retired.  Pt would like another referral Currently taking mucinex  Not helping    Please advise

## 2019-01-12 NOTE — Telephone Encounter (Signed)
Needs virtual visit today. Thanks.

## 2019-01-12 NOTE — Telephone Encounter (Signed)
Pt would like abx for sinus is it ojk to send or do patient to to be scheduled for an tel-med visit

## 2019-01-18 ENCOUNTER — Telehealth (INDEPENDENT_AMBULATORY_CARE_PROVIDER_SITE_OTHER): Payer: Medicare HMO | Admitting: Emergency Medicine

## 2019-01-18 ENCOUNTER — Other Ambulatory Visit: Payer: Self-pay

## 2019-01-18 ENCOUNTER — Encounter: Payer: Self-pay | Admitting: Emergency Medicine

## 2019-01-18 DIAGNOSIS — J0191 Acute recurrent sinusitis, unspecified: Secondary | ICD-10-CM | POA: Diagnosis not present

## 2019-01-18 MED ORDER — DOXYCYCLINE HYCLATE 100 MG PO TABS: 100.0000 mg | ORAL_TABLET | Freq: Two times a day (BID) | ORAL | 0 refills | Status: DC

## 2019-01-18 MED ORDER — PREDNISONE 20 MG PO TABS: 40.0000 mg | ORAL_TABLET | Freq: Every day | ORAL | 0 refills | Status: AC

## 2019-01-18 NOTE — Telephone Encounter (Signed)
Patient have an appt

## 2019-01-18 NOTE — Patient Instructions (Signed)
° ° ° °  If you have lab work done today you will be contacted with your lab results within the next 2 weeks.  If you have not heard from us then please contact us. The fastest way to get your results is to register for My Chart. ° ° °IF you received an x-ray today, you will receive an invoice from [facility]. Please contact  Radiology at [phone] with questions or concerns regarding your invoice.  ° °IF you received labwork today, you will receive an invoice from [facility]. Please contact [facility] at [phone] with questions or concerns regarding your invoice.  ° °Our billing staff will not be able to assist you with questions regarding bills from these companies. ° °You will be contacted with the lab results as soon as they are available. The fastest way to get your results is to activate your My Chart account. Instructions are located on the last page of this paperwork. If you have not heard from us regarding the results in 2 weeks, please contact this office. °  ° ° ° °

## 2019-01-18 NOTE — Progress Notes (Signed)
Sinus trouble going on for two weeks this time around. There is sinus drainage. Sinus infection.  Cough  Looking for an older ENT. Referral   Unable to use nose spray.

## 2019-01-18 NOTE — Progress Notes (Signed)
Telemedicine Encounter- SOAP NOTE Established Patient  This telephone encounter was conducted with the patient's (or proxy's) verbal consent via audio telecommunications: yes/no: Yes Patient was instructed to have this encounter in a suitably private space; and to only have persons present to whom they give permission to participate. In addition, patient identity was confirmed by use of name plus two identifiers (DOB and address).  I discussed the limitations, risks, security and privacy concerns of performing an evaluation and management service by telephone and the availability of in person appointments. I also discussed with the patient that there may be a patient responsible charge related to this service. The patient expressed understanding and agreed to proceed.  I spent a total of TIME; 0 MIN TO 60 MIN: 15 minutes talking with the patient or their proxy.  Chief Complaint  Patient presents with  . Sinusitis    Subjective   Jasmine Long is a 68 y.o. female established patient. Telephone visit today complaining of sinus symptoms for 1 week.  Complaining of sinus congestion with pressure and pain and nasal discharge.  Has a history of recurrent bouts of sinusitis.  Behaving like a typical one.  HPI   Patient Active Problem List   Diagnosis Date Noted  . History of peptic ulcer 04/30/2017  . Hyperlipidemia 12/11/2015  . Insomnia 12/11/2015  . Panic attack as reaction to stress 12/11/2015  . Tobacco abuse 12/11/2015  . Chronic sinusitis 12/11/2015  . Diabetes ([place]) 05/15/2015  . Hypothyroid 04/29/2011  . BMI 40.0-44.9, adult ([place]) 04/29/2011  . Depression with anxiety 04/29/2011  . GERD (gastroesophageal reflux disease) 04/29/2011  . 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  . Nicotine addiction 04/29/2011  . Hypertension 04/29/2011  . Murmur, cardiac 04/29/2011    Past Medical History:  Diagnosis Date  . Allergy   . Anxiety   . Depression   . Heart murmur   . Hypertension   .  Thyroid disease   . Ulcer     Current Outpatient Medications  Medication Sig Dispense Refill  . ALPRAZolam (XANAX) 0.5 MG tablet Take 1 tablet (0.5 mg total) by mouth daily as needed for anxiety. 60 tablet 1  . atorvastatin (LIPITOR) 20 MG tablet Take 1 tablet (20 mg total) by mouth daily. 90 tablet 3  . buPROPion (WELLBUTRIN XL) 300 MG 24 hr tablet TAKE 1 TABLET BY MOUTH EVERY DAY 90 tablet 1  . clonazePAM (KLONOPIN) 1 MG tablet TAKE 1 TABLET BY MOUTH AT BEDTIME FOR SLEEP OR ANXIETY 30 tablet 1  . levothyroxine (SYNTHROID) 125 MCG tablet TAKE 1 TABLET (125 MCG TOTAL) BY MOUTH DAILY BEFORE BREAKFAST. 90 tablet 1  . losartan (COZAAR) 50 MG tablet TAKE 1 TABLET BY MOUTH TWICE A DAY 180 tablet 0  . metFORMIN (GLUCOPHAGE) 1000 MG tablet TAKE 1 TABLET BY MOUTH 2 TIMES DAILY WITH MEALS 180 tablet 3  . metoprolol succinate (TOPROL XL) 25 MG 24 hr tablet Take 1 tablet (25 mg total) by mouth daily. 90 tablet 3  . azithromycin (ZITHROMAX) 250 MG tablet Sig as indicated (Patient not taking: Reported on 01/18/2019) 6 tablet 0  . diphenhydrAMINE-zinc acetate (BENADRYL EXTRA STRENGTH) cream Apply 1 application topically 3 (three) times daily as needed for itching. (Patient not taking: Reported on 01/18/2019) 28.4 g 2  . doxycycline (VIBRA-TABS) 100 MG tablet Take 1 tablet (100 mg total) by mouth 2 (two) times daily. (Patient not taking: Reported on 01/18/2019) 20 tablet 0  . hydrOXYzine (ATARAX/VISTARIL) 25 MG tablet Take 1  tablet (25 mg total) by mouth every 6 (six) hours as needed. (Patient not taking: Reported on 01/18/2019) 12 tablet 0  . montelukast (SINGULAIR) 10 MG tablet Take 1 tablet (10 mg total) by mouth at bedtime. (Patient not taking: Reported on 01/18/2019) 30 tablet 3  . nicotine (NICODERM CQ - DOSED IN MG/24 HOURS) 21 mg/24hr patch Place 1 patch (21 mg total) onto the skin daily. (Patient not taking: Reported on 01/18/2019) 28 patch 3  . ondansetron (ZOFRAN) 4 MG tablet Take 1 tablet (4 mg  total) by mouth every 8 (eight) hours as needed for nausea or vomiting. (Patient not taking: Reported on 01/18/2019) 20 tablet 0   No current facility-administered medications for this visit.    No Known Allergies  Social History   Socioeconomic History  . Marital status: Divorced    Spouse name: Not on file  . Number of children: Not on file  . Years of education: Not on file  . Highest education level: Not on file  Occupational History  . Occupation: Unemployed  Tobacco Use  . Smoking status: Current Every Day Smoker    Packs/day: 1.50    Years: 40.00    Pack years: 60.00    Types: Cigarettes    Last attempt to quit: 01/20/2012    Years since quitting: 7.0  . Smokeless tobacco: Never Used  . Tobacco comment: Pt doing E-cigarette as of 01/20/12  Substance and Sexual Activity  . Alcohol use: No  . Drug use: No  . Sexual activity: Never  Other Topics Concern  . Not on file  Social History Narrative   Patient on worker's compensation for an injury sustained on the job 5 years ago.   Social Determinants of Health   Financial Resource Strain:   . Difficulty of Paying Living Expenses: Not on file  Food Insecurity:   . Worried About Programme researcher, broadcasting/film/videounning Out of Food in the Last Year: Not on file  . Ran Out of Food in the Last Year: Not on file  Transportation Needs:   . Lack of Transportation (Medical): Not on file  . Lack of Transportation (Non-Medical): Not on file  Physical Activity:   . Days of Exercise per Week: Not on file  . Minutes of Exercise per Session: Not on file  Stress:   . Feeling of Stress : Not on file  Social Connections:   . Frequency of Communication with Friends and Family: Not on file  . Frequency of Social Gatherings with Friends and Family: Not on file  . Attends Religious Services: Not on file  . Active Member of Clubs or Organizations: Not on file  . Attends BankerClub or Organization Meetings: Not on file  . Marital Status: Not on file  Intimate Partner  Violence:   . Fear of Current or Ex-Partner: Not on file  . Emotionally Abused: Not on file  . Physically Abused: Not on file  . Sexually Abused: Not on file    Review of Systems  Constitutional: Negative.  Negative for chills and fever.  HENT: Positive for congestion and sinus pain.   Respiratory: Negative.  Negative for cough and shortness of breath.   Cardiovascular: Negative.  Negative for chest pain and palpitations.  Gastrointestinal: Negative.  Negative for abdominal pain, diarrhea, nausea and vomiting.  Genitourinary: Negative.  Negative for dysuria and hematuria.  Musculoskeletal: Negative.  Negative for myalgias.  Skin: Negative.  Negative for rash.  Neurological: Negative.  Negative for dizziness and headaches.  Endo/Heme/Allergies: Negative.  All other systems reviewed and are negative.   Objective  Alert and oriented x3 in no apparent respiratory distress. Vitals as reported by the patient: There were no vitals filed for this visit.  There are no diagnoses linked to this encounter. Maryjayne was seen today for sinusitis.  Diagnoses and all orders for this visit:  Acute recurrent sinusitis, unspecified location -     predniSONE (DELTASONE) 20 MG tablet; Take 2 tablets (40 mg total) by mouth daily with breakfast for 5 days. -     doxycycline (VIBRA-TABS) 100 MG tablet; Take 1 tablet (100 mg total) by mouth 2 (two) times daily.  Clinically stable.  No red flag signs or symptoms.  Take medications as prescribed and contact the office if no better or worse the next several days.   I discussed the assessment and treatment plan with the patient. The patient was provided an opportunity to ask questions and all were answered. The patient agreed with the plan and demonstrated an understanding of the instructions.   The patient was advised to call back or seek an in-person evaluation if the symptoms worsen or if the condition fails to improve as anticipated.  I provided 15  minutes of non-face-to-face time during this encounter.  [name], MD  Primary Care at [facility]

## 2019-01-19 NOTE — Addendum Note (Signed)
Addended by: Davina Poke on: 01/19/2019 08:17 AM   Modules accepted: Orders

## 2019-02-28 ENCOUNTER — Other Ambulatory Visit: Payer: Self-pay | Admitting: Emergency Medicine

## 2019-02-28 DIAGNOSIS — I10 Essential (primary) hypertension: Secondary | ICD-10-CM

## 2019-02-28 NOTE — Telephone Encounter (Signed)
Requested medication (s) are due for refill today: yes  Requested medication (s) are on the active medication list:yes  Last refill:  11/18/2018  Future visit scheduled: No  Notes to clinic:  Patient requesting change to 100mg  daily per pharmacy note    Requested Prescriptions  Pending Prescriptions Disp Refills   losartan (COZAAR) 100 MG tablet [Pharmacy Med Name: LOSARTAN POTASSIUM 100 MG TAB]  0    Sig: Please specify directions, refills and quantity      Cardiovascular:  Angiotensin Receptor Blockers Failed - 02/28/2019  5:00 PM      Failed - Cr in normal range and within 180 days    Creat  Date Value Ref Range Status  10/30/2013 0.98 0.50 - 1.10 mg/dL Final   Creatinine, Ser  Date Value Ref Range Status  04/20/2018 0.99 0.57 - 1.00 mg/dL Final          Failed - K in normal range and within 180 days    Potassium  Date Value Ref Range Status  04/20/2018 4.6 3.5 - 5.2 mmol/L Final          Passed - Patient is not pregnant      Passed - Last BP in normal range    BP Readings from Last 1 Encounters:  09/05/18 135/77          Passed - Valid encounter within last 6 months    Recent Outpatient Visits           1 month ago Acute recurrent sinusitis, unspecified location   [facility] at [place], [place], [place]   5 months ago Situational mixed anxiety and depressive disorder   Primary Care at [place], [place], MD   8 months ago Acute recurrent sinusitis, unspecified location   Primary Care at [place], [place], [place]   9 months ago Medicare annual wellness visit, subsequent   Primary Care at [place], [place], [place]   10 months ago Allergic reaction, initial encounter   Primary Care at [facility], [place], [place]

## 2019-04-18 ENCOUNTER — Other Ambulatory Visit: Payer: Self-pay | Admitting: Emergency Medicine

## 2019-04-18 ENCOUNTER — Telehealth: Payer: Self-pay

## 2019-04-18 DIAGNOSIS — F4323 Adjustment disorder with mixed anxiety and depressed mood: Secondary | ICD-10-CM

## 2019-04-18 DIAGNOSIS — F418 Other specified anxiety disorders: Secondary | ICD-10-CM

## 2019-04-18 MED ORDER — ALPRAZOLAM 0.5 MG PO TABS: 0.5000 mg | ORAL_TABLET | Freq: Every day | ORAL | 1 refills | Status: DC | PRN

## 2019-04-18 NOTE — Telephone Encounter (Signed)
Patient is requesting a refill of the following medications: xanax 0.5 mg Requested Prescriptions    No prescriptions requested or ordered in this encounter    Date of patient request: 04/18/19 Last office visit: 09/05/2018 Date of last refill: 09/05/2018 Last refill amount: 60 Follow up time period per chart: unspecified

## 2019-04-18 NOTE — Telephone Encounter (Signed)
Requested medication (s) are due for refill today: Yes  Requested medication (s) are on the active medication list: Yes  Last refill:  06/30/18  Future visit scheduled: No  Notes to clinic:  Prescription has expired.    Requested Prescriptions  Pending Prescriptions Disp Refills   clonazePAM (KLONOPIN) 1 MG tablet [Pharmacy Med Name: CLONAZEPAM 1 MG TABLET] 30 tablet     Sig: TAKE 1 TABLET BY MOUTH AT BEDTIME FOR SLEEP OR ANXIETY      Not Delegated - Psychiatry:  Anxiolytics/Hypnotics Failed - 04/18/2019  3:52 PM      Failed - This refill cannot be delegated      Failed - Urine Drug Screen completed in last 360 days.      Passed - Valid encounter within last 6 months    Recent Outpatient Visits           3 months ago Acute recurrent sinusitis, unspecified location   Primary Care at [facility], [place], [place]   7 months ago Situational mixed anxiety and depressive disorder   Primary Care at [facility], [place], [place]   9 months ago Acute recurrent sinusitis, unspecified location   Primary Care at [facility], [place], MD   11 months ago Medicare annual wellness visit, subsequent   Primary Care at [facility], [place], MD   12 months ago Allergic reaction, initial encounter   Primary Care at [facility], [place], [place]

## 2019-04-18 NOTE — Telephone Encounter (Signed)
Patient is requesting a refill of the following medications: Requested Prescriptions   Pending Prescriptions Disp Refills  . clonazePAM (KLONOPIN) 1 MG tablet [Pharmacy Med Name: CLONAZEPAM 1 MG TABLET] 30 tablet     Sig: TAKE 1 TABLET BY MOUTH AT BEDTIME FOR SLEEP OR ANXIETY    Date of patient request: 04/18/2019 Last office visit: 01/18/2019 Date of last refill: 06/30/2018 Last refill amount: 30 one refill  Follow up time period per chart:No follow up scheduled

## 2019-04-24 ENCOUNTER — Other Ambulatory Visit: Payer: Self-pay | Admitting: Emergency Medicine

## 2019-04-24 ENCOUNTER — Telehealth: Payer: Self-pay | Admitting: Emergency Medicine

## 2019-04-24 DIAGNOSIS — F418 Other specified anxiety disorders: Secondary | ICD-10-CM

## 2019-04-24 MED ORDER — CLONAZEPAM 1 MG PO TABS: ORAL_TABLET | ORAL | 1 refills | Status: DC

## 2019-04-24 NOTE — Telephone Encounter (Signed)
Pt would like a call from Dr. Alvy Bimler concerning a  recommendation for someone who can talk to her son. Her son(Robbie) has extreme mental issues. If you are speaking with her and her son walks in-she is going to start talking about her sinuses. Please advise at [phone].

## 2019-04-24 NOTE — Telephone Encounter (Signed)
Prescription for Klonopin sent to her pharmacy of record.  Thanks.

## 2019-05-16 ENCOUNTER — Other Ambulatory Visit: Payer: Self-pay

## 2019-05-16 ENCOUNTER — Encounter: Payer: Self-pay | Admitting: Emergency Medicine

## 2019-05-16 ENCOUNTER — Telehealth (INDEPENDENT_AMBULATORY_CARE_PROVIDER_SITE_OTHER): Payer: Medicare HMO | Admitting: Emergency Medicine

## 2019-05-16 VITALS — Ht 69.0 in | Wt 230.0 lb

## 2019-05-16 DIAGNOSIS — J0191 Acute recurrent sinusitis, unspecified: Secondary | ICD-10-CM

## 2019-05-16 DIAGNOSIS — E1159 Type 2 diabetes mellitus with other circulatory complications: Secondary | ICD-10-CM

## 2019-05-16 DIAGNOSIS — I10 Essential (primary) hypertension: Secondary | ICD-10-CM | POA: Diagnosis not present

## 2019-05-16 MED ORDER — PREDNISONE 20 MG PO TABS: 40.0000 mg | ORAL_TABLET | Freq: Every day | ORAL | 0 refills | Status: AC

## 2019-05-16 MED ORDER — DOXYCYCLINE HYCLATE 100 MG PO TABS: 100.0000 mg | ORAL_TABLET | Freq: Two times a day (BID) | ORAL | 0 refills | Status: DC

## 2019-05-16 NOTE — Progress Notes (Signed)
Telemedicine Encounter- SOAP NOTE Established Patient MyChart video conference attempted without success This telephone encounter was conducted with the patient's (or proxy's) verbal consent via audio telecommunications: yes/no: Yes Patient was instructed to have this encounter in a suitably private space; and to only have persons present to whom they give permission to participate. In addition, patient identity was confirmed by use of name plus two identifiers (DOB and address).  I discussed the limitations, risks, security and privacy concerns of performing an evaluation and management service by telephone and the availability of in person appointments. I also discussed with the patient that there may be a patient responsible charge related to this service. The patient expressed understanding and agreed to proceed.  I spent a total of TIME; 0 MIN TO 60 MIN: 15 minutes talking with the patient or their proxy.  Chief Complaint  Patient presents with  . Cough    productive yellowish x 1 month - sinus infection  . Nasal Congestion    down the back of the throat    Subjective   Jasmine Long is a 69 y.o. female established patient. Telephone visit today for sinus symptoms.  Has history of chronic sinusitis with increase symptoms for the past 4 weeks, complaining of productive nasal discharge with nasal congestion. No other significant symptoms.  HPI   Patient Active Problem List   Diagnosis Date Noted  . History of peptic ulcer 04/30/2017  . Hyperlipidemia 12/11/2015  . Insomnia 12/11/2015  . Panic attack as reaction to stress 12/11/2015  . Tobacco abuse 12/11/2015  . Chronic sinusitis 12/11/2015  . Diabetes (HCC) 05/15/2015  . Hypothyroid 04/29/2011  . BMI 40.0-44.9, adult (HCC) 04/29/2011  . Depression with anxiety 04/29/2011  . GERD (gastroesophageal reflux disease) 04/29/2011  . 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  . Nicotine addiction 04/29/2011  . Hypertension associated with  diabetes (HCC) 04/29/2011  . Murmur, cardiac 04/29/2011    Past Medical History:  Diagnosis Date  . Allergy   . Anxiety   . Depression   . Heart murmur   . Hypertension   . Thyroid disease   . Ulcer     Current Outpatient Medications  Medication Sig Dispense Refill  . ALPRAZolam (XANAX) 0.5 MG tablet Take 1 tablet (0.5 mg total) by mouth daily as needed for anxiety. 30 tablet 1  . buPROPion (WELLBUTRIN XL) 300 MG 24 hr tablet TAKE 1 TABLET BY MOUTH EVERY DAY 90 tablet 1  . clonazePAM (KLONOPIN) 1 MG tablet TAKE 1 TABLET BY MOUTH AT BEDTIME FOR SLEEP OR ANXIETY 30 tablet 1  . levothyroxine (SYNTHROID) 125 MCG tablet TAKE 1 TABLET (125 MCG TOTAL) BY MOUTH DAILY BEFORE BREAKFAST. 90 tablet 1  . losartan (COZAAR) 100 MG tablet Please specify directions, refills and quantity 90 tablet 0  . metFORMIN (GLUCOPHAGE) 1000 MG tablet TAKE 1 TABLET BY MOUTH 2 TIMES DAILY WITH MEALS 180 tablet 3  . metoprolol succinate (TOPROL XL) 25 MG 24 hr tablet Take 1 tablet (25 mg total) by mouth daily. 90 tablet 3  . nicotine (NICODERM CQ - DOSED IN MG/24 HOURS) 21 mg/24hr patch Place 1 patch (21 mg total) onto the skin daily. 28 patch 3  . atorvastatin (LIPITOR) 20 MG tablet Take 1 tablet (20 mg total) by mouth daily. 90 tablet 3  . diphenhydrAMINE-zinc acetate (BENADRYL EXTRA STRENGTH) cream Apply 1 application topically 3 (three) times daily as needed for itching. (Patient not taking: Reported on 05/16/2019) 28.4 g 2  . hydrOXYzine (ATARAX/VISTARIL)  25 MG tablet Take 1 tablet (25 mg total) by mouth every 6 (six) hours as needed. (Patient not taking: Reported on 05/16/2019) 12 tablet 0  . montelukast (SINGULAIR) 10 MG tablet Take 1 tablet (10 mg total) by mouth at bedtime. (Patient not taking: Reported on 05/16/2019) 30 tablet 3  . ondansetron (ZOFRAN) 4 MG tablet Take 1 tablet (4 mg total) by mouth every 8 (eight) hours as needed for nausea or vomiting. (Patient not taking: Reported on 05/16/2019) 20 tablet 0    No current facility-administered medications for this visit.    No Known Allergies  Social History   Socioeconomic History  . Marital status: Divorced    Spouse name: Not on file  . Number of children: Not on file  . Years of education: Not on file  . Highest education level: Not on file  Occupational History  . Occupation: Unemployed  Tobacco Use  . Smoking status: Current Every Day Smoker    Packs/day: 1.50    Years: 40.00    Pack years: 60.00    Types: Cigarettes    Last attempt to quit: 01/20/2012    Years since quitting: 7.3  . Smokeless tobacco: Never Used  . Tobacco comment: Pt doing E-cigarette as of 01/20/12  Substance and Sexual Activity  . Alcohol use: No  . Drug use: No  . Sexual activity: Never  Other Topics Concern  . Not on file  Social History Narrative   Patient on worker's compensation for an injury sustained on the job 5 years ago.   Social Determinants of Health   Financial Resource Strain:   . Difficulty of Paying Living Expenses:   Food Insecurity:   . Worried About Charity fundraiser in the Last Year:   . Arboriculturist in the Last Year:   Transportation Needs:   . Film/video editor (Medical):   Marland Kitchen Lack of Transportation (Non-Medical):   Physical Activity:   . Days of Exercise per Week:   . Minutes of Exercise per Session:   Stress:   . Feeling of Stress :   Social Connections:   . Frequency of Communication with Friends and Family:   . Frequency of Social Gatherings with Friends and Family:   . Attends Religious Services:   . Active Member of Clubs or Organizations:   . Attends Archivist Meetings:   Marland Kitchen Marital Status:   Intimate Partner Violence:   . Fear of Current or Ex-Partner:   . Emotionally Abused:   Marland Kitchen Physically Abused:   . Sexually Abused:     Review of Systems  Constitutional: Negative.  Negative for chills and fever.  HENT: Positive for congestion and sinus pain. Negative for sore throat.    Respiratory: Positive for cough.   Cardiovascular: Negative.  Negative for chest pain and palpitations.  Gastrointestinal: Negative for abdominal pain, nausea and vomiting.  Skin: Negative.  Negative for rash.  Neurological: Negative for dizziness and headaches.  All other systems reviewed and are negative.   Objective  Alert and oriented x3 in no apparent respiratory distress. Vitals as reported by the patient: Today's Vitals   05/16/19 1018  Weight: 230 lb (104.3 kg)  Height: 5\' 9"  (1.753 m)    Romeka was seen today for cough and nasal congestion.  Diagnoses and all orders for this visit:  Acute recurrent sinusitis, unspecified location -     predniSONE (DELTASONE) 20 MG tablet; Take 2 tablets (40 mg total) by mouth daily  with breakfast for 5 days. -     doxycycline (VIBRA-TABS) 100 MG tablet; Take 1 tablet (100 mg total) by mouth 2 (two) times daily.  Hypertension associated with diabetes (HCC)      I discussed the assessment and treatment plan with the patient. The patient was provided an opportunity to ask questions and all were answered. The patient agreed with the plan and demonstrated an understanding of the instructions.   The patient was advised to call back or seek an in-person evaluation if the symptoms worsen or if the condition fails to improve as anticipated.  I provided 15 minutes of non-face-to-face time during this encounter.  [name], MD  Primary Care at [facility]

## 2019-05-16 NOTE — Patient Instructions (Signed)
° ° ° °  If you have lab work done today you will be contacted with your lab results within the next 2 weeks.  If you have not heard from us then please contact us. The fastest way to get your results is to register for My Chart. ° ° °IF you received an x-ray today, you will receive an invoice from [facility]. Please contact [facility] at [phone] with questions or concerns regarding your invoice.  ° °IF you received labwork today, you will receive an invoice from [facility]. Please contact [facility] at [phone] with questions or concerns regarding your invoice.  ° °Our billing staff will not be able to assist you with questions regarding bills from these companies. ° °You will be contacted with the lab results as soon as they are available. The fastest way to get your results is to activate your My Chart account. Instructions are located on the last page of this paperwork. If you have not heard from us regarding the results in 2 weeks, please contact this office. °  ° ° ° °

## 2019-05-26 ENCOUNTER — Ambulatory Visit (INDEPENDENT_AMBULATORY_CARE_PROVIDER_SITE_OTHER): Payer: Medicare HMO | Admitting: Family Medicine

## 2019-05-26 VITALS — BP 135/77 | Ht 67.52 in | Wt 230.0 lb

## 2019-05-26 DIAGNOSIS — Z0000 Encounter for general adult medical examination without abnormal findings: Secondary | ICD-10-CM

## 2019-05-26 NOTE — Progress Notes (Signed)
Presents today for Jasmine Long Visit   Date of last exam: 05-16-2019  Interpreter used for this visit? No  I connected with  [name] on 05/26/19 telephone  application and verified that I am speaking with Jasmine correct person using two identifiers.   I discussed Jasmine limitations of evaluation and management by telemedicine. Jasmine patient expressed understanding and agreed to proceed.    Patient Care Team: [name], MD as PCP - General (Internal Medicine)   Other items to address today:   Discussed immunizations Discussed Eye/Dental    Other Screening: Last screening for diabetes: 04-20-2018 Last lipid screening:04-20-2018   ADVANCE DIRECTIVES: Discussed: yes On File: no Materials Provided: yes  Immunization status:  There is no immunization history for Jasmine selected administration types on file for this patient.   Health Maintenance Due  Topic Date Due  . OPHTHALMOLOGY EXAM  Never done  . COVID-19 Vaccine (1) Never done  . HEMOGLOBIN A1C  10/21/2018     Functional Status Survey: Does Jasmine patient have difficulty seeing, even when wearing glasses/contacts?: No Does Jasmine patient have difficulty concentrating, remembering, or making decisions?: No Does Jasmine patient have difficulty walking or climbing stairs?: No Does Jasmine patient have difficulty dressing or bathing?: No Does Jasmine patient have difficulty doing errands alone such as visiting a doctor's office or shopping?: No   6CIT Screen 05/26/2019 05/23/2018  What Year? 0 points 0 points  What month? 0 points 0 points  What time? 0 points 0 points  Count back from 20 0 points 0 points  Months in reverse 0 points 0 points  Repeat phrase 0 points 0 points  Total Score 0 0        Clinical Support from 05/26/2019 in [facility] at [facility]  AUDIT-C Score  0       Home Environment:   Lives in two story home Trouble climbing stairs (has some nerve damage) takes some time and can do  it. No scattered rugs No grab bars Adequate lighting/no clutter   Patient Active Problem List   Diagnosis Date Noted  . History of peptic ulcer 04/30/2017  . Hyperlipidemia 12/11/2015  . Insomnia 12/11/2015  . Panic attack as reaction to stress 12/11/2015  . Tobacco abuse 12/11/2015  . Chronic sinusitis 12/11/2015  . Diabetes (HCC) 05/15/2015  . Hypothyroid 04/29/2011  . BMI 40.0-44.9, adult (HCC) 04/29/2011  . Depression with anxiety 04/29/2011  . GERD (gastroesophageal reflux disease) 04/29/2011  . 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  . Nicotine addiction 04/29/2011  . Hypertension associated with diabetes (HCC) 04/29/2011  . Murmur, cardiac 04/29/2011     Past Medical History:  Diagnosis Date  . Allergy   . Anxiety   . Depression   . Heart murmur   . Hypertension   . Thyroid disease   . Ulcer      Past Surgical History:  Procedure Laterality Date  . BREAST SURGERY    . CESAREAN SECTION    . CHOLECYSTECTOMY    . NASAL SEPTUM SURGERY       Family History  Problem Relation Age of Onset  . Lung cancer Mother   . Dementia Father   . Atrial fibrillation Father      Social History   Socioeconomic History  . Marital status: Divorced    Spouse name: Not on file  . Number of children: Not on file  . Years of education: Not on file  . Highest education level: Not  on file  Occupational History  . Occupation: Unemployed  Tobacco Use  . Smoking status: Current Every Day Smoker    Packs/day: 1.50    Years: 40.00    Pack years: 60.00    Types: Cigarettes    Last attempt to quit: 01/20/2012    Years since quitting: 7.3  . Smokeless tobacco: Never Used  . Tobacco comment: Pt doing E-cigarette as of 01/20/12  Substance and Sexual Activity  . Alcohol use: No  . Drug use: No  . Sexual activity: Never  Other Topics Concern  . Not on file  Social History Narrative   Patient on worker's compensation for an injury sustained on [place] job 5 years ago.   Social  Determinants of Health   Financial Resource Strain:   . Difficulty of Paying Living Expenses:   Food Insecurity:   . Worried About Charity fundraiser in [place] Last Year:   . Arboriculturist in [place] Last Year:   Transportation Needs:   . Film/video editor (Medical):   Marland Kitchen Lack of Transportation (Non-Medical):   Physical Activity:   . Days of Exercise per Week:   . Minutes of Exercise per Session:   Stress:   . Feeling of Stress :   Social Connections:   . Frequency of Communication with Friends and Family:   . Frequency of Social Gatherings with Friends and Family:   . Attends Religious Services:   . Active Member of Clubs or Organizations:   . Attends Archivist Meetings:   Marland Kitchen Marital Status:   Intimate Partner Violence:   . Fear of Current or Ex-Partner:   . Emotionally Abused:   Marland Kitchen Physically Abused:   . Sexually Abused:      No Known Allergies   Prior to Admission medications   Medication Sig Start Date End Date Taking? Authorizing Provider  ALPRAZolam Duanne Moron) 0.5 MG tablet Take 1 tablet (0.5 mg total) by mouth daily as needed for anxiety. 04/18/19  Yes Sagardia, Ines Bloomer, MD  atorvastatin (LIPITOR) 20 MG tablet Take 1 tablet (20 mg total) by mouth daily. 04/20/18 05/26/19 Yes Sagardia, Ines Bloomer, MD  buPROPion (WELLBUTRIN XL) 300 MG 24 hr tablet TAKE 1 TABLET BY MOUTH EVERY DAY 11/18/18  Yes Sagardia, Ines Bloomer, MD  clonazePAM (KLONOPIN) 1 MG tablet TAKE 1 TABLET BY MOUTH AT BEDTIME FOR SLEEP OR ANXIETY 04/24/19  Yes Sagardia, Ines Bloomer, MD  doxycycline (VIBRA-TABS) 100 MG tablet Take 1 tablet (100 mg total) by mouth 2 (two) times daily. 05/16/19  Yes Sagardia, Ines Bloomer, MD  levothyroxine (SYNTHROID) 125 MCG tablet TAKE 1 TABLET (125 MCG TOTAL) BY MOUTH DAILY BEFORE BREAKFAST. 11/18/18  Yes Sagardia, Ines Bloomer, MD  losartan (COZAAR) 100 MG tablet Please specify directions, refills and quantity 02/28/19  Yes Sagardia, Ines Bloomer, MD  metFORMIN  (GLUCOPHAGE) 1000 MG tablet TAKE 1 TABLET BY MOUTH 2 TIMES DAILY WITH MEALS 04/20/18  Yes Sagardia, Ines Bloomer, MD  metoprolol succinate (TOPROL XL) 25 MG 24 hr tablet Take 1 tablet (25 mg total) by mouth daily. 04/20/18  Yes Sagardia, Ines Bloomer, MD  nicotine (NICODERM CQ - DOSED IN MG/24 HOURS) 21 mg/24hr patch Place 1 patch (21 mg total) onto Jasmine skin daily. 04/20/18  Yes Sagardia, Ines Bloomer, MD  diphenhydrAMINE-zinc acetate (BENADRYL EXTRA STRENGTH) cream Apply 1 application topically 3 (three) times daily as needed for itching. Patient not taking: Reported on 05/16/2019 04/20/18   Horald Pollen, MD  hydrOXYzine (ATARAX/VISTARIL)  25 MG tablet Take 1 tablet (25 mg total) by mouth every 6 (six) hours as needed. Patient not taking: Reported on 05/16/2019 04/16/18   Tilden Fossa, MD  montelukast (SINGULAIR) 10 MG tablet Take 1 tablet (10 mg total) by mouth at bedtime. Patient not taking: Reported on 05/16/2019 12/09/17   [name], MD  ondansetron (ZOFRAN) 4 MG tablet Take 1 tablet (4 mg total) by mouth every 8 (eight) hours as needed for nausea or vomiting. Patient not taking: Reported on 05/16/2019 04/30/18   [name], MD     Depression screen [facility] 2/9 05/26/2019 05/16/2019 01/18/2019 09/05/2018 06/30/2018  Decreased Interest 0 0 0 1 0  Down, Depressed, Hopeless 0 0 0 1 0  PHQ - 2 Score 0 0 0 2 0  Altered sleeping - - - 1 -  Tired, decreased energy - - - 1 -  Change in appetite - - - 0 -  Feeling bad or failure about yourself  - - - 1 -  Trouble concentrating - - - 1 -  Moving slowly or fidgety/restless - - - 0 -  Suicidal thoughts - - - 0 -  PHQ-9 Score - - - 6 -  Difficult doing work/chores - - - - -     Fall Risk  05/26/2019 05/16/2019 01/18/2019 09/05/2018 06/30/2018  Falls in Jasmine past year? 0 0 0 0 0  Comment - - - - -  Number falls in past yr: 0 - 0 0 -  Injury with Fall? 0 - 0 0 -  Follow up Falls evaluation completed;Education provided Falls evaluation  completed - - Falls evaluation completed      PHYSICAL EXAM: BP 135/77 Comment: taken from previous visit  Ht 5' 7.52" (1.715 m)   Wt 230 lb (104.3 kg)   BMI 35.47 kg/m    Wt Readings from Last 3 Encounters:  05/26/19 230 lb (104.3 kg)  05/16/19 230 lb (104.3 kg)  09/05/18 254 lb (115.2 kg)       Education/Counseling provided regarding diet and exercise, prevention of chronic diseases, smoking/tobacco cessation, if applicable, and reviewed "Covered Medicare Preventive Services."

## 2019-05-26 NOTE — Patient Instructions (Signed)
Thank you for taking time to come for your Medicare Wellness Visit. I appreciate your ongoing commitment to your health goals. Please review the following plan we discussed and let me know if I can assist you in the future.  Donyae Kilner LPN  Preventive Care 67 Years and Older, Female Preventive care refers to lifestyle choices and visits with your health care provider that can promote health and wellness. This includes:  A yearly physical exam. This is also called an annual well check.  Regular dental and eye exams.  Immunizations.  Screening for certain conditions.  Healthy lifestyle choices, such as diet and exercise. What can I expect for my preventive care visit? Physical exam Your health care provider will check:  Height and weight. These may be used to calculate body mass index (BMI), which is a measurement that tells if you are at a healthy weight.  Heart rate and blood pressure.  Your skin for abnormal spots. Counseling Your health care provider may ask you questions about:  Alcohol, tobacco, and drug use.  Emotional well-being.  Home and relationship well-being.  Sexual activity.  Eating habits.  History of falls.  Memory and ability to understand (cognition).  Work and work environment.  Pregnancy and menstrual history. What immunizations do I need?  Influenza (flu) vaccine  This is recommended every year. Tetanus, diphtheria, and pertussis (Tdap) vaccine  You may need a Td booster every 10 years. Varicella (chickenpox) vaccine  You may need this vaccine if you have not already been vaccinated. Zoster (shingles) vaccine  You may need this after age 60. Pneumococcal conjugate (PCV13) vaccine  One dose is recommended after age 67. Pneumococcal polysaccharide (PPSV23) vaccine  One dose is recommended after age 67. Measles, mumps, and rubella (MMR) vaccine  You may need at least one dose of MMR if you were born in 1957 or later. You may also  need a second dose. Meningococcal conjugate (MenACWY) vaccine  You may need this if you have certain conditions. Hepatitis A vaccine  You may need this if you have certain conditions or if you travel or work in places where you may be exposed to hepatitis A. Hepatitis B vaccine  You may need this if you have certain conditions or if you travel or work in places where you may be exposed to hepatitis B. Haemophilus influenzae type b (Hib) vaccine  You may need this if you have certain conditions. You may receive vaccines as individual doses or as more than one vaccine together in one shot (combination vaccines). Talk with your health care provider about the risks and benefits of combination vaccines. What tests do I need? Blood tests  Lipid and cholesterol levels. These may be checked every 5 years, or more frequently depending on your overall health.  Hepatitis C test.  Hepatitis B test. Screening  Lung cancer screening. You may have this screening every year starting at age 55 if you have a 30-pack-year history of smoking and currently smoke or have quit within the past 15 years.  Colorectal cancer screening. All adults should have this screening starting at age 50 and continuing until age 75. Your health care provider may recommend screening at age 45 if you are at increased risk. You will have tests every 1-10 years, depending on your results and the type of screening test.  Diabetes screening. This is done by checking your blood sugar (glucose) after you have not eaten for a while (fasting). You may have this done every 1-3   Mammogram. This may be done every 1-2 years. Talk with your health care provider about how often you should have regular mammograms.  BRCA-related cancer screening. This may be done if you have a family history of breast, ovarian, tubal, or peritoneal cancers. Other tests  Sexually transmitted disease (STD) testing.  Bone density scan. This is done  to screen for osteoporosis. You may have this done starting at age 23. Follow these instructions at home: Eating and drinking  Eat a diet that includes fresh fruits and vegetables, whole grains, lean protein, and low-fat dairy products. Limit your intake of foods with high amounts of sugar, saturated fats, and salt.  Take vitamin and mineral supplements as recommended by your health care provider.  Do not drink alcohol if your health care provider tells you not to drink.  If you drink alcohol: ? Limit how much you have to 0-1 drink a day. ? Be aware of how much alcohol is in your drink. In the [place] one drink equals one 12 oz bottle of beer (355 mL), one 5 oz glass of wine (148 mL), or one 1 oz glass of hard liquor (44 mL). Lifestyle  Take daily care of your teeth and gums.  Stay active. Exercise for at least 30 minutes on 5 or more days each week.  Do not use any products that contain nicotine or tobacco, such as cigarettes, e-cigarettes, and chewing tobacco. If you need help quitting, ask your health care provider.  If you are sexually active, practice safe sex. Use a condom or other form of protection in order to prevent STIs (sexually transmitted infections).  Talk with your health care provider about taking a low-dose aspirin or statin. What's next?  Go to your health care provider once a year for a well check visit.  Ask your health care provider how often you should have your eyes and teeth checked.  Stay up to date on all vaccines. This information is not intended to replace advice given to you by your health care provider. Make sure you discuss any questions you have with your health care provider. Document Revised: 01/06/2018 Document Reviewed: 01/06/2018 Elsevier Patient Education  2020 [employer].

## 2019-06-03 ENCOUNTER — Other Ambulatory Visit: Payer: Self-pay | Admitting: Emergency Medicine

## 2019-06-03 DIAGNOSIS — E785 Hyperlipidemia, unspecified: Secondary | ICD-10-CM

## 2019-06-03 DIAGNOSIS — I10 Essential (primary) hypertension: Secondary | ICD-10-CM

## 2019-06-05 ENCOUNTER — Telehealth: Payer: Self-pay | Admitting: Emergency Medicine

## 2019-06-05 DIAGNOSIS — F418 Other specified anxiety disorders: Secondary | ICD-10-CM

## 2019-06-05 DIAGNOSIS — E119 Type 2 diabetes mellitus without complications: Secondary | ICD-10-CM

## 2019-06-05 DIAGNOSIS — E079 Disorder of thyroid, unspecified: Secondary | ICD-10-CM

## 2019-06-05 DIAGNOSIS — I10 Essential (primary) hypertension: Secondary | ICD-10-CM

## 2019-06-05 NOTE — Telephone Encounter (Signed)
Pt also needs refill for losartan (COZAAR) 100 MG tablet  And the pharmacy needs the specific direction for this/ Pt is leaving to go out of town Saturday and would like them before then/ please advise

## 2019-06-07 ENCOUNTER — Telehealth: Payer: Self-pay | Admitting: Emergency Medicine

## 2019-06-07 ENCOUNTER — Other Ambulatory Visit: Payer: Self-pay | Admitting: *Deleted

## 2019-06-07 DIAGNOSIS — E1159 Type 2 diabetes mellitus with other circulatory complications: Secondary | ICD-10-CM

## 2019-06-07 DIAGNOSIS — E785 Hyperlipidemia, unspecified: Secondary | ICD-10-CM

## 2019-06-07 DIAGNOSIS — E079 Disorder of thyroid, unspecified: Secondary | ICD-10-CM

## 2019-06-07 DIAGNOSIS — I10 Essential (primary) hypertension: Secondary | ICD-10-CM

## 2019-06-07 MED ORDER — LOSARTAN POTASSIUM 100 MG PO TABS: ORAL_TABLET | ORAL | 1 refills | Status: DC

## 2019-06-07 NOTE — Telephone Encounter (Signed)
Called pt and sch appt for med refill on 06/28/19

## 2019-06-07 NOTE — Telephone Encounter (Signed)
Medication refills sent this morning.

## 2019-06-07 NOTE — Telephone Encounter (Signed)
Pt called and made her a med refill apt for 06/28/19. Pt is wanting a courtesy refill for those medications. Pt is going out of town on 06/10/19 and wont be back until  06/26/19. Please advise.

## 2019-06-07 NOTE — Telephone Encounter (Signed)
Patient needs to schedule an appointment for office visit and labs. Last labs were done 04/20/2018.

## 2019-06-07 NOTE — Telephone Encounter (Signed)
Patient is requesting a refill of the following medications: Requested Prescriptions   Pending Prescriptions Disp Refills  . metFORMIN (GLUCOPHAGE) 1000 MG tablet [Pharmacy Med Name: METFORMIN HCL 1,000 MG TABLET] 60 tablet 0    Sig: TAKE 1 TABLET BY MOUTH TWICE A DAY WITH MEALS  . levothyroxine (SYNTHROID) 125 MCG tablet [Pharmacy Med Name: LEVOTHYROXINE 125 MCG TABLET] 30 tablet 0    Sig: TAKE 1 TABLET (125 MCG TOTAL) BY MOUTH DAILY BEFORE BREAKFAST.  Marland Kitchen buPROPion (WELLBUTRIN XL) 300 MG 24 hr tablet [Pharmacy Med Name: BUPROPION HCL XL 300 MG TABLET] 30 tablet 0    Sig: TAKE 1 TABLET BY MOUTH EVERY DAY    Date of patient request: 06/07/2019 Last office visit: 05/26/2019 Date of last refill: 11/18/2018 Last refill amount: 90 tablets 1 refill Follow up time period per chart: N/a

## 2019-06-07 NOTE — Telephone Encounter (Signed)
Pt is calling again stating that she is leaving town on 06/10/19 and is needing these medications pt had her AWV on 4/30. Pt also stated on of these medications did not have directions sent in with it so pharmacy couldn't fill it.Pt would like a call when this is resolved Please advise.   levothyroxine (SYNTHROID) 125 MCG tablet [[id number]]   losartan (COZAAR) 100 MG tablet [[id number]]    Please advise.  [phone]

## 2019-06-07 NOTE — Telephone Encounter (Signed)
Spoke to Dr Alvy Bimler for the [facility] on Losartan 100 mg. Medication approved with Sig and refills sent to pharmacy.

## 2019-06-07 NOTE — Telephone Encounter (Signed)
Pt just had her AWV on 05/26/19 Please advise if she still needs appt.

## 2019-06-07 NOTE — Telephone Encounter (Signed)
Pt stated she will be going out of town for a few weeks and she needs the Rx refill request approved asap

## 2019-06-28 ENCOUNTER — Other Ambulatory Visit: Payer: Self-pay

## 2019-06-28 ENCOUNTER — Encounter: Payer: Self-pay | Admitting: Emergency Medicine

## 2019-06-28 ENCOUNTER — Ambulatory Visit (INDEPENDENT_AMBULATORY_CARE_PROVIDER_SITE_OTHER): Payer: Medicare HMO | Admitting: Emergency Medicine

## 2019-06-28 VITALS — BP 138/78 | HR 84 | Temp 98.1°F | Resp 15 | Ht 67.5 in | Wt 250.8 lb

## 2019-06-28 DIAGNOSIS — F419 Anxiety disorder, unspecified: Secondary | ICD-10-CM | POA: Diagnosis not present

## 2019-06-28 DIAGNOSIS — E785 Hyperlipidemia, unspecified: Secondary | ICD-10-CM | POA: Diagnosis not present

## 2019-06-28 DIAGNOSIS — J329 Chronic sinusitis, unspecified: Secondary | ICD-10-CM

## 2019-06-28 DIAGNOSIS — R011 Cardiac murmur, unspecified: Secondary | ICD-10-CM | POA: Diagnosis not present

## 2019-06-28 DIAGNOSIS — E039 Hypothyroidism, unspecified: Secondary | ICD-10-CM

## 2019-06-28 DIAGNOSIS — E1169 Type 2 diabetes mellitus with other specified complication: Secondary | ICD-10-CM

## 2019-06-28 DIAGNOSIS — I10 Essential (primary) hypertension: Secondary | ICD-10-CM | POA: Diagnosis not present

## 2019-06-28 DIAGNOSIS — F17200 Nicotine dependence, unspecified, uncomplicated: Secondary | ICD-10-CM

## 2019-06-28 DIAGNOSIS — E1159 Type 2 diabetes mellitus with other circulatory complications: Secondary | ICD-10-CM

## 2019-06-28 LAB — POCT GLYCOSYLATED HEMOGLOBIN (HGB A1C): Hemoglobin A1C: 6.4 % — AB (ref 4.0–5.6)

## 2019-06-28 NOTE — Assessment & Plan Note (Signed)
Attempting to stop. Using nicotine patches and over-the-counter medication.

## 2019-06-28 NOTE — Patient Instructions (Addendum)
   If you have lab work done today you will be contacted with your lab results within the next 2 weeks.  If you have not heard from us then please contact us. The fastest way to get your results is to register for My Chart.   IF you received an x-ray today, you will receive an invoice from [facility]. Please contact [facility] at [phone] with questions or concerns regarding your invoice.   IF you received labwork today, you will receive an invoice from [facility]. Please contact [facility] at [phone] with questions or concerns regarding your invoice.   Our billing staff will not be able to assist you with questions regarding bills from these companies.  You will be contacted with the lab results as soon as they are available. The fastest way to get your results is to activate your My Chart account. Instructions are located on the last page of this paperwork. If you have not heard from us regarding the results in 2 weeks, please contact this office.     Health Maintenance After Age 65 After age 65, you are at a higher risk for certain long-term diseases and infections as well as injuries from falls. Falls are a major cause of broken bones and head injuries in people who are older than age 65. Getting regular preventive care can help to keep you healthy and well. Preventive care includes getting regular testing and making lifestyle changes as recommended by your health care provider. Talk with your health care provider about:  Which screenings and tests you should have. A screening is a test that checks for a disease when you have no symptoms.  A diet and exercise plan that is right for you. What should I know about screenings and tests to prevent falls? Screening and testing are the best ways to find a health problem early. Early diagnosis and treatment give you the best chance of managing medical conditions that are common after age 65. Certain conditions and  lifestyle choices may make you more likely to have a fall. Your health care provider may recommend:  Regular vision checks. Poor vision and conditions such as cataracts can make you more likely to have a fall. If you wear glasses, make sure to get your prescription updated if your vision changes.  Medicine review. Work with your health care provider to regularly review all of the medicines you are taking, including over-the-counter medicines. Ask your health care provider about any side effects that may make you more likely to have a fall. Tell your health care provider if any medicines that you take make you feel dizzy or sleepy.  Osteoporosis screening. Osteoporosis is a condition that causes the bones to get weaker. This can make the bones weak and cause them to break more easily.  Blood pressure screening. Blood pressure changes and medicines to control blood pressure can make you feel dizzy.  Strength and balance checks. Your health care provider may recommend certain tests to check your strength and balance while standing, walking, or changing positions.  Foot health exam. Foot pain and numbness, as well as not wearing proper footwear, can make you more likely to have a fall.  Depression screening. You may be more likely to have a fall if you have a fear of falling, feel emotionally low, or feel unable to do activities that you used to do.  Alcohol use screening. Using too much alcohol can affect your balance and may make you more likely to   have a fall. What actions can I take to lower my risk of falls? General instructions  Talk with your health care provider about your risks for falling. Tell your health care provider if: ? You fall. Be sure to tell your health care provider about all falls, even ones that seem minor. ? You feel dizzy, sleepy, or off-balance.  Take over-the-counter and prescription medicines only as told by your health care provider. These include any  supplements.  Eat a healthy diet and maintain a healthy weight. A healthy diet includes low-fat dairy products, low-fat (lean) meats, and fiber from whole grains, beans, and lots of fruits and vegetables. Home safety  Remove any tripping hazards, such as rugs, cords, and clutter.  Install safety equipment such as grab bars in bathrooms and safety rails on stairs.  Keep rooms and walkways well-lit. Activity   Follow a regular exercise program to stay fit. This will help you maintain your balance. Ask your health care provider what types of exercise are appropriate for you.  If you need a cane or walker, use it as recommended by your health care provider.  Wear supportive shoes that have nonskid soles. Lifestyle  Do not drink alcohol if your health care provider tells you not to drink.  If you drink alcohol, limit how much you have: ? 0-1 drink a day for women. ? 0-2 drinks a day for men.  Be aware of how much alcohol is in your drink. In the [place] one drink equals one typical bottle of beer (12 oz), one-half glass of wine (5 oz), or one shot of hard liquor (1 oz).  Do not use any products that contain nicotine or tobacco, such as cigarettes and e-cigarettes. If you need help quitting, ask your health care provider. Summary  Having a healthy lifestyle and getting preventive care can help to protect your health and wellness after age 65.  Screening and testing are the best way to find a health problem early and help you avoid having a fall. Early diagnosis and treatment give you the best chance for managing medical conditions that are more common for people who are older than age 65.  Falls are a major cause of broken bones and head injuries in people who are older than age 65. Take precautions to prevent a fall at home.  Work with your health care provider to learn what changes you can make to improve your health and wellness and to prevent falls. This information is not intended  to replace advice given to you by your health care provider. Make sure you discuss any questions you have with your health care provider. Document Revised: 05/05/2018 Document Reviewed: 11/25/2016 Elsevier Patient Education  2020 Elsevier Inc.  

## 2019-06-28 NOTE — Assessment & Plan Note (Signed)
Chronic since she was a teenager. Needs new ENT evaluation.

## 2019-06-28 NOTE — Assessment & Plan Note (Signed)
No recent cardiac evaluation. Needs cardiology referral and echocardiogram.

## 2019-06-28 NOTE — Assessment & Plan Note (Signed)
Well-controlled hypertension. Continue present medications. No changes. Well-controlled diabetes with hemoglobin A1c of 6.4. Continue present medication. Diet and nutrition discussed with patient. Follow-up in 6 months.

## 2019-06-28 NOTE — Progress Notes (Signed)
Jasmine Long 69 y.o.   Chief Complaint  Patient presents with  . Hypothyroidism    pt is here to have labs drawn for her thyroid level, pt also needs a refill     HISTORY OF PRESENT ILLNESS: This is a 69 y.o. female with chronic medical problems here for follow-up: 1. Hypertension: On losartan 100 mg and metoprolol succinate 25 mg daily BP Readings from Last 3 Encounters:  06/28/19 138/78  05/26/19 135/77  09/05/18 135/77    2. Diabetes: On Metformin 1000 mg twice a day Lab Results  Component Value Date   HGBA1C 6.4 (A) 06/28/2019   3. Dyslipidemia on atorvastatin 20 mg daily 4. Hypothyroidism: On Synthroid 125 mcg daily 5. Current smoker presently on nicotine patch and abuse.  6. Chronic anxiety 7. Chronic sinusitis, needs ENT referral.  HPI   Prior to Admission medications   Medication Sig Start Date End Date Taking? Authorizing Provider  ALPRAZolam Prudy Feeler(XANAX) 0.5 MG tablet Take 1 tablet (0.5 mg total) by mouth daily as needed for anxiety. 04/18/19  Yes Evertt Chouinard, Eilleen KempfMiguel Jose, MD  atorvastatin (LIPITOR) 20 MG tablet TAKE 1 TABLET BY MOUTH EVERY DAY 06/03/19  Yes Georgina QuintSagardia, Kirsta Probert Jose, MD  buPROPion (WELLBUTRIN XL) 300 MG 24 hr tablet TAKE 1 TABLET BY MOUTH EVERY DAY 06/07/19  Yes Rolene Andrades, Eilleen KempfMiguel Jose, MD  clonazePAM (KLONOPIN) 1 MG tablet TAKE 1 TABLET BY MOUTH AT BEDTIME FOR SLEEP OR ANXIETY 04/24/19  Yes Hadley Detloff, Eilleen KempfMiguel Jose, MD  hydrOXYzine (VISTARIL) 25 MG capsule Take 25 mg by mouth 3 (three) times daily as needed (PRN for itching).   Yes [provider]  levothyroxine (SYNTHROID) 125 MCG tablet TAKE 1 TABLET (125 MCG TOTAL) BY MOUTH DAILY BEFORE BREAKFAST. 06/07/19  Yes  Wenzlick, Eilleen KempfMiguel Jose, MD  losartan (COZAAR) 100 MG tablet Take 1 tablet (100 mg total) by mouth daily. 06/07/19  Yes Georgina QuintSagardia, Nevena Rozenberg Jose, MD  metFORMIN (GLUCOPHAGE) 1000 MG tablet TAKE 1 TABLET BY MOUTH TWICE A DAY WITH MEALS 06/07/19  Yes Malillany Kazlauskas, Eilleen KempfMiguel Jose, MD  metoprolol succinate (TOPROL-XL)  25 MG 24 hr tablet TAKE 1 TABLET BY MOUTH EVERY DAY 06/03/19  Yes Tayonna Bacha, Eilleen KempfMiguel Jose, MD  montelukast (SINGULAIR) 10 MG tablet Take 1 tablet (10 mg total) by mouth at bedtime. 12/09/17  Yes Haivyn Oravec, Eilleen KempfMiguel Jose, MD  nicotine (NICODERM CQ - DOSED IN MG/24 HOURS) 21 mg/24hr patch Place 1 patch (21 mg total) onto the skin daily. 04/20/18  Yes SagardiaEilleen Kempf, Lyfe Reihl Jose, MD    No Known Allergies  Patient Active Problem List   Diagnosis Date Noted  . History of peptic ulcer 04/30/2017  . Hyperlipidemia 12/11/2015  . Insomnia 12/11/2015  . Panic attack as reaction to stress 12/11/2015  . Tobacco abuse 12/11/2015  . Chronic sinusitis 12/11/2015  . Dyslipidemia associated with type 2 diabetes mellitus (HCC) 05/15/2015  . Hypothyroid 04/29/2011  . BMI 40.0-44.9, adult (HCC) 04/29/2011  . Depression with anxiety 04/29/2011  . GERD (gastroesophageal reflux disease) 04/29/2011  . 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  . Nicotine addiction 04/29/2011  . Hypertension associated with diabetes (HCC) 04/29/2011  . Murmur, cardiac 04/29/2011    Past Medical History:  Diagnosis Date  . Allergy   . Anxiety   . Depression   . Heart murmur   . Hypertension   . Thyroid disease   . Ulcer     Past Surgical History:  Procedure Laterality Date  . BREAST SURGERY    . CESAREAN SECTION    . CHOLECYSTECTOMY    .  NASAL SEPTUM SURGERY      Social History   Socioeconomic History  . Marital status: Divorced    Spouse name: Not on file  . Number of children: Not on file  . Years of education: Not on file  . Highest education level: Not on file  Occupational History  . Occupation: Unemployed  Tobacco Use  . Smoking status: Current Every Day Smoker    Packs/day: 1.50    Years: 40.00    Pack years: 60.00    Types: Cigarettes    Last attempt to quit: 01/20/2012    Years since quitting: 7.4  . Smokeless tobacco: Never Used  . Tobacco comment: Pt doing E-cigarette as of 01/20/12  Substance and Sexual  Activity  . Alcohol use: No  . Drug use: No  . Sexual activity: Never  Other Topics Concern  . Not on file  Social History Narrative   Patient on worker's compensation for an injury sustained on the job 5 years ago.   Social Determinants of Health   Financial Resource Strain:   . Difficulty of Paying Living Expenses:   Food Insecurity:   . Worried About Programme researcher, broadcasting/film/video in the Last Year:   . Barista in the Last Year:   Transportation Needs:   . Freight forwarder (Medical):   Marland Kitchen Lack of Transportation (Non-Medical):   Physical Activity:   . Days of Exercise per Week:   . Minutes of Exercise per Session:   Stress:   . Feeling of Stress :   Social Connections:   . Frequency of Communication with Friends and Family:   . Frequency of Social Gatherings with Friends and Family:   . Attends Religious Services:   . Active Member of Clubs or Organizations:   . Attends Banker Meetings:   Marland Kitchen Marital Status:   Intimate Partner Violence:   . Fear of Current or Ex-Partner:   . Emotionally Abused:   Marland Kitchen Physically Abused:   . Sexually Abused:     Family History  Problem Relation Age of Onset  . Lung cancer Mother   . Dementia Father   . Atrial fibrillation Father      Review of Systems  Constitutional: Negative.  Negative for chills and fever.  HENT: Negative.  Negative for congestion and sore throat.   Respiratory: Negative.  Negative for cough and shortness of breath.   Cardiovascular: Negative.  Negative for chest pain and palpitations.  Gastrointestinal: Negative.  Negative for abdominal pain, blood in stool, diarrhea, melena, nausea and vomiting.  Genitourinary: Negative.  Negative for dysuria and hematuria.  Musculoskeletal: Negative.  Negative for back pain, myalgias and neck pain.  Skin: Negative.  Negative for rash.  Neurological: Negative.  Negative for dizziness and headaches.  Endo/Heme/Allergies: Negative.   All other systems reviewed  and are negative.   Today's Vitals   06/28/19 1034 06/28/19 1042  BP: (!) 158/79 138/78  Pulse: 84   Resp: 15   Temp: 98.1 F (36.7 C)   TempSrc: Temporal   SpO2: 97%   Weight: 250 lb 12.8 oz (113.8 kg)   Height: 5' 7.5" (1.715 m)    Body mass index is 38.7 kg/m.  Physical Exam Vitals reviewed.  Constitutional:      Appearance: She is obese.  HENT:     Head: Normocephalic.  Eyes:     Extraocular Movements: Extraocular movements intact.     Conjunctiva/sclera: Conjunctivae normal.     Pupils:  Pupils are equal, round, and reactive to light.  Cardiovascular:     Rate and Rhythm: Normal rate and regular rhythm.     Heart sounds: Murmur (Aortic area) present. Systolic murmur present with a grade of 3/6.  Pulmonary:     Effort: Pulmonary effort is normal.     Breath sounds: Normal breath sounds.  Musculoskeletal:        General: Normal range of motion.     Cervical back: Normal range of motion and neck supple.  Skin:    General: Skin is warm and dry.     Capillary Refill: Capillary refill takes less than 2 seconds.  Neurological:     General: No focal deficit present.     Mental Status: She is alert and oriented to person, place, and time.  Psychiatric:        Mood and Affect: Mood normal.        Behavior: Behavior normal.    A total of 30 minutes was spent with the patient, greater than 50% of which was in counseling/coordination of care regarding chronic medical problems including hypertension and diabetes, review of most recent office visit notes, review of most recent blood work results, review of all medications, smoking and cardiovascular risks associated with this, diet and nutrition, general anxiety and ways to cope with this chronic condition, prognosis and need to follow-up in 6 months   ASSESSMENT & PLAN: Hypertension associated with diabetes ([place]) Well-controlled hypertension. Continue present medications. No changes. Well-controlled diabetes with  hemoglobin A1c of 6.4. Continue present medication. Diet and nutrition discussed with patient. Follow-up in 6 months.  Chronic sinusitis Chronic since she was a teenager. Needs new ENT evaluation.  Dyslipidemia associated with type 2 diabetes mellitus (HCC) Fasting labs done today. Continue statin therapy.  Murmur, cardiac No recent cardiac evaluation. Needs cardiology referral and echocardiogram.  Current smoker Attempting to stop. Using nicotine patches and over-the-counter medication.  Chronic anxiety Presently on medication, under control.   Theda was seen today for hypothyroidism.  Diagnoses and all orders for this visit:  Hypertension associated with diabetes ([place]) -     POCT glycosylated hemoglobin (Hb A1C) -     Comprehensive metabolic panel -     CBC with Differential/Platelet -     Lipid panel  Hypothyroidism, unspecified type -     TSH  Dyslipidemia associated with type 2 diabetes mellitus (HCC)  Murmur, cardiac -     Ambulatory referral to Cardiology  Chronic sinusitis, unspecified location -     Ambulatory referral to ENT  Current smoker  Chronic anxiety    Patient Instructions       If you have lab work done today you will be contacted with your lab results within the next 2 weeks.  If you have not heard from [place] then please contact us. The fastest way to get your results is to register for My Chart.   IF you received an x-ray today, you will receive an invoice from [facility]. Please contact [facility] at [phone] with questions or concerns regarding your invoice.   IF you received labwork today, you will receive an invoice from [facility]. Please contact [facility] at [phone] with questions or concerns regarding your invoice.   Our billing staff will not be able to assist you with questions regarding bills from these companies.  You will be contacted with the lab results as soon as they are available. The fastest way  to get your results is to activate  your My Chart account. Instructions are located on the last page of this paperwork. If you have not heard from [place] regarding the results in 2 weeks, please contact this office.       Health Maintenance After Age 24 After age 26, you are at a higher risk for certain long-term diseases and infections as well as injuries from falls. Falls are a major cause of broken bones and head injuries in people who are older than age 61. Getting regular preventive care can help to keep you healthy and well. Preventive care includes getting regular testing and making lifestyle changes as recommended by your health care provider. Talk with your health care provider about:  Which screenings and tests you should have. A screening is a test that checks for a disease when you have no symptoms.  A diet and exercise plan that is right for you. What should I know about screenings and tests to prevent falls? Screening and testing are the best ways to find a health problem early. Early diagnosis and treatment give you the best chance of managing medical conditions that are common after age 59. Certain conditions and lifestyle choices may make you more likely to have a fall. Your health care provider may recommend:  Regular vision checks. Poor vision and conditions such as cataracts can make you more likely to have a fall. If you wear glasses, make sure to get your prescription updated if your vision changes.  Medicine review. Work with your health care provider to regularly review all of the medicines you are taking, including over-the-counter medicines. Ask your health care provider about any side effects that may make you more likely to have a fall. Tell your health care provider if any medicines that you take make you feel dizzy or sleepy.  Osteoporosis screening. Osteoporosis is a condition that causes the bones to get weaker. This can make the bones weak and cause them to break more  easily.  Blood pressure screening. Blood pressure changes and medicines to control blood pressure can make you feel dizzy.  Strength and balance checks. Your health care provider may recommend certain tests to check your strength and balance while standing, walking, or changing positions.  Foot health exam. Foot pain and numbness, as well as not wearing proper footwear, can make you more likely to have a fall.  Depression screening. You may be more likely to have a fall if you have a fear of falling, feel emotionally low, or feel unable to do activities that you used to do.  Alcohol use screening. Using too much alcohol can affect your balance and may make you more likely to have a fall. What actions can I take to lower my risk of falls? General instructions  Talk with your health care provider about your risks for falling. Tell your health care provider if: ? You fall. Be sure to tell your health care provider about all falls, even ones that seem minor. ? You feel dizzy, sleepy, or off-balance.  Take over-the-counter and prescription medicines only as told by your health care provider. These include any supplements.  Eat a healthy diet and maintain a healthy weight. A healthy diet includes low-fat dairy products, low-fat (lean) meats, and fiber from whole grains, beans, and lots of fruits and vegetables. Home safety  Remove any tripping hazards, such as rugs, cords, and clutter.  Install safety equipment such as grab bars in bathrooms and safety rails on stairs.  Keep rooms and walkways well-lit. Activity  Follow a regular exercise program to stay fit. This will help you maintain your balance. Ask your health care provider what types of exercise are appropriate for you.  If you need a cane or walker, use it as recommended by your health care provider.  Wear supportive shoes that have nonskid soles. Lifestyle  Do not drink alcohol if your health care provider tells you not to  drink.  If you drink alcohol, limit how much you have: ? 0-1 drink a day for women. ? 0-2 drinks a day for men.  Be aware of how much alcohol is in your drink. In the [place] one drink equals one typical bottle of beer (12 oz), one-half glass of wine (5 oz), or one shot of hard liquor (1 oz).  Do not use any products that contain nicotine or tobacco, such as cigarettes and e-cigarettes. If you need help quitting, ask your health care provider. Summary  Having a healthy lifestyle and getting preventive care can help to protect your health and wellness after age 68.  Screening and testing are the best way to find a health problem early and help you avoid having a fall. Early diagnosis and treatment give you the best chance for managing medical conditions that are more common for people who are older than age 7.  Falls are a major cause of broken bones and head injuries in people who are older than age 21. Take precautions to prevent a fall at home.  Work with your health care provider to learn what changes you can make to improve your health and wellness and to prevent falls. This information is not intended to replace advice given to you by your health care provider. Make sure you discuss any questions you have with your health care provider. Document Revised: 05/05/2018 Document Reviewed: 11/25/2016 Elsevier Patient Education  2020 Elsevier Inc.      Edwina Barth, MD [facility]

## 2019-06-28 NOTE — Assessment & Plan Note (Signed)
Presently on medication, under control.

## 2019-06-28 NOTE — Assessment & Plan Note (Signed)
Fasting labs done today. Continue statin therapy.

## 2019-06-29 LAB — CBC WITH DIFFERENTIAL/PLATELET
Basophils Absolute: 0 10*3/uL (ref 0.0–0.2)
Basos: 1 %
EOS (ABSOLUTE): 0.1 10*3/uL (ref 0.0–0.4)
Eos: 1 %
Hematocrit: 40.7 % (ref 34.0–46.6)
Hemoglobin: 13.3 g/dL (ref 11.1–15.9)
Immature Grans (Abs): 0 10*3/uL (ref 0.0–0.1)
Immature Granulocytes: 0 %
Lymphocytes Absolute: 2.3 10*3/uL (ref 0.7–3.1)
Lymphs: 29 %
MCH: 29.6 pg (ref 26.6–33.0)
MCHC: 32.7 g/dL (ref 31.5–35.7)
MCV: 90 fL (ref 79–97)
Monocytes Absolute: 0.7 10*3/uL (ref 0.1–0.9)
Monocytes: 8 %
Neutrophils Absolute: 4.8 10*3/uL (ref 1.4–7.0)
Neutrophils: 61 %
Platelets: 371 10*3/uL (ref 150–450)
RBC: 4.5 x10E6/uL (ref 3.77–5.28)
RDW: 13 % (ref 11.7–15.4)
WBC: 7.8 10*3/uL (ref 3.4–10.8)

## 2019-06-29 LAB — COMPREHENSIVE METABOLIC PANEL
ALT: 9 IU/L (ref 0–32)
AST: 15 IU/L (ref 0–40)
Albumin/Globulin Ratio: 1.7 (ref 1.2–2.2)
Albumin: 4.3 g/dL (ref 3.8–4.8)
Alkaline Phosphatase: 103 IU/L (ref 48–121)
BUN/Creatinine Ratio: 13 (ref 12–28)
BUN: 12 mg/dL (ref 8–27)
Bilirubin Total: 0.4 mg/dL (ref 0.0–1.2)
CO2: 22 mmol/L (ref 20–29)
Calcium: 10 mg/dL (ref 8.7–10.3)
Chloride: 102 mmol/L (ref 96–106)
Creatinine, Ser: 0.96 mg/dL (ref 0.57–1.00)
GFR calc Af Amer: 71 mL/min/{1.73_m2} (ref >59)
GFR calc non Af Amer: 61 mL/min/{1.73_m2} (ref >59)
Globulin, Total: 2.5 g/dL (ref 1.5–4.5)
Glucose: 121 mg/dL — ABNORMAL HIGH (ref 65–99)
Potassium: 5.3 mmol/L — ABNORMAL HIGH (ref 3.5–5.2)
Sodium: 141 mmol/L (ref 134–144)
Total Protein: 6.8 g/dL (ref 6.0–8.5)

## 2019-06-29 LAB — LIPID PANEL
Chol/HDL Ratio: 3.7 ratio (ref 0.0–4.4)
Cholesterol, Total: 189 mg/dL (ref 100–199)
HDL: 51 mg/dL (ref >39)
LDL Chol Calc (NIH): 121 mg/dL — ABNORMAL HIGH (ref 0–99)
Triglycerides: 92 mg/dL (ref 0–149)
VLDL Cholesterol Cal: 17 mg/dL (ref 5–40)

## 2019-06-29 LAB — TSH: TSH: 1.27 u[IU]/mL (ref 0.450–4.500)

## 2019-06-30 ENCOUNTER — Other Ambulatory Visit: Payer: Self-pay | Admitting: Emergency Medicine

## 2019-06-30 ENCOUNTER — Telehealth: Payer: Self-pay | Admitting: Emergency Medicine

## 2019-06-30 DIAGNOSIS — F418 Other specified anxiety disorders: Secondary | ICD-10-CM

## 2019-06-30 DIAGNOSIS — E119 Type 2 diabetes mellitus without complications: Secondary | ICD-10-CM

## 2019-06-30 NOTE — Telephone Encounter (Signed)
Pt called regarding her electro cardiogram Pt recived a call that she had one in 2019 at [facility] on [address]. Pt is wanting to know if she still need another one or if she is okay. Pt would like a call to know what she should do. [phone] Please advise.

## 2019-06-30 NOTE — Telephone Encounter (Signed)
Patient is requesting a refill of the following medications: Requested Prescriptions   Pending Prescriptions Disp Refills   buPROPion (WELLBUTRIN XL) 300 MG 24 hr tablet [Pharmacy Med Name: BUPROPION HCL XL 300 MG TABLET] 90 tablet 1    Sig: TAKE 1 TABLET BY MOUTH EVERY DAY    Date of patient request: 06/30/2019 Last office visit: 06/28/2019 Date of last refill: 06/07/2019 Last refill amount: 30 tablets Follow up time period per chart: 6 months

## 2019-06-30 NOTE — Telephone Encounter (Signed)
Called pt back no answer. Left message to call back.

## 2019-06-30 NOTE — Telephone Encounter (Signed)
Patient called to ask  Jasmine Long  To please  her back

## 2019-06-30 NOTE — Telephone Encounter (Signed)
I have called pt back and she wanted to confirm that she is suppose to go to [facility]. She is suppose to see cards and ENT office.   She stated understanding.

## 2019-07-01 ENCOUNTER — Other Ambulatory Visit: Payer: Self-pay | Admitting: Emergency Medicine

## 2019-07-01 DIAGNOSIS — E079 Disorder of thyroid, unspecified: Secondary | ICD-10-CM

## 2019-07-01 NOTE — Telephone Encounter (Signed)
Most recent TSH on 06/28/19 WNL. Approved per protocol.  Requested Prescriptions  Pending Prescriptions Disp Refills  . levothyroxine (SYNTHROID) 125 MCG tablet [Pharmacy Med Name: LEVOTHYROXINE 125 MCG TABLET] 90 tablet 0    Sig: TAKE 1 TABLET (125 MCG TOTAL) BY MOUTH DAILY BEFORE BREAKFAST.     Endocrinology:  Hypothyroid Agents Failed - 07/01/2019 12:54 AM      Failed - TSH needs to be rechecked within 3 months after an abnormal result. Refill until TSH is due.      Passed - TSH in normal range and within 360 days    TSH  Date Value Ref Range Status  06/28/2019 1.270 0.450 - 4.500 uIU/mL Final         Passed - Valid encounter within last 12 months    Recent Outpatient Visits          3 days ago Hypertension associated with diabetes [facility])   Primary Care at [facility], Eilleen Kempf, MD   1 month ago Medicare annual wellness visit, subsequent   Primary Care at [facility], Manus Rudd, MD   1 month ago Acute recurrent sinusitis, unspecified location   Primary Care at [facility], Eilleen Kempf, MD   5 months ago Acute recurrent sinusitis, unspecified location   Primary Care at [facility], [place], [place]   9 months ago Situational mixed anxiety and depressive disorder   Primary Care at [facility], [place], [place]

## 2019-07-10 ENCOUNTER — Other Ambulatory Visit: Payer: Self-pay | Admitting: Emergency Medicine

## 2019-07-10 DIAGNOSIS — E119 Type 2 diabetes mellitus without complications: Secondary | ICD-10-CM

## 2019-07-10 DIAGNOSIS — M79641 Pain in right hand: Secondary | ICD-10-CM | POA: Diagnosis not present

## 2019-07-10 DIAGNOSIS — F17200 Nicotine dependence, unspecified, uncomplicated: Secondary | ICD-10-CM | POA: Diagnosis not present

## 2019-07-10 DIAGNOSIS — S6991XA Unspecified injury of right wrist, hand and finger(s), initial encounter: Secondary | ICD-10-CM | POA: Diagnosis not present

## 2019-07-10 DIAGNOSIS — M79644 Pain in right finger(s): Secondary | ICD-10-CM | POA: Diagnosis not present

## 2019-07-10 NOTE — Telephone Encounter (Signed)
Requested Prescriptions  Pending Prescriptions Disp Refills  . metFORMIN (GLUCOPHAGE) 1000 MG tablet [Pharmacy Med Name: METFORMIN HCL 1,000 MG TABLET] 120 tablet 0    Sig: TAKE 1 TABLET BY MOUTH TWICE A DAY WITH MEALS     Endocrinology:  Diabetes - Biguanides Passed - 07/10/2019  9:22 AM      Passed - Cr in normal range and within 360 days    Creat  Date Value Ref Range Status  10/30/2013 0.98 0.50 - 1.10 mg/dL Final   Creatinine, Ser  Date Value Ref Range Status  06/28/2019 0.96 0.57 - 1.00 mg/dL Final         Passed - HBA1C is between 0 and 7.9 and within 180 days    Hemoglobin A1C  Date Value Ref Range Status  06/28/2019 6.4 (A) 4.0 - 5.6 % Final   Hgb A1c MFr Bld  Date Value Ref Range Status  04/20/2018 7.2 (H) 4.8 - 5.6 % Final    Comment:             Prediabetes: 5.7 - 6.4          Diabetes: >6.4          Glycemic control for adults with diabetes: <7.0          Passed - eGFR in normal range and within 360 days    GFR, Est African American  Date Value Ref Range Status  10/30/2013 72 mL/min Final   GFR calc Af Amer  Date Value Ref Range Status  06/28/2019 71 >59 mL/min/1.73 Final    Comment:    **[facility] currently reports eGFR in compliance with the current**   recommendations of the [employer]. [facility] will   update reporting as new guidelines are published from the NKF-ASN   Task force.    GFR, Est Non African American  Date Value Ref Range Status  10/30/2013 62 mL/min Final    Comment:      The estimated GFR is a calculation valid for adults (>=35 years old) that uses the CKD-EPI algorithm to adjust for age and sex. It is   not to be used for children, pregnant women, hospitalized patients,    patients on dialysis, or with rapidly changing kidney function. According to the NKDEP, eGFR >89 is normal, 60-89 shows mild impairment, 30-59 shows moderate impairment, 15-29 shows severe impairment and <15 is ESRD.     GFR calc non Af Amer   Date Value Ref Range Status  06/28/2019 61 >59 mL/min/1.73 Final         Passed - Valid encounter within last 6 months    Recent Outpatient Visits          1 week ago Hypertension associated with diabetes [facility])   Primary Care at [facility], Jasmine Bloomer, MD   1 month ago Medicare annual wellness visit, subsequent   Primary Care at [facility], Jasmine Solomons, MD   1 month ago Acute recurrent sinusitis, unspecified location   Primary Care at [facility], Jasmine Bloomer, MD   5 months ago Acute recurrent sinusitis, unspecified location   Primary Care at [facility], [place], [place]   10 months ago Situational mixed anxiety and depressive disorder   Primary Care at [facility], Jasmine Long, Jasmine Long

## 2019-07-30 ENCOUNTER — Other Ambulatory Visit: Payer: Self-pay | Admitting: Emergency Medicine

## 2019-07-30 DIAGNOSIS — E119 Type 2 diabetes mellitus without complications: Secondary | ICD-10-CM

## 2019-07-30 NOTE — Telephone Encounter (Signed)
Requested Prescriptions  Pending Prescriptions Disp Refills  . metFORMIN (GLUCOPHAGE) 1000 MG tablet [Pharmacy Med Name: METFORMIN HCL 1,000 MG TABLET] 180 tablet 1    Sig: TAKE 1 TABLET BY MOUTH TWICE A DAY WITH MEALS     Endocrinology:  Diabetes - Biguanides Passed - 07/30/2019 10:58 AM      Passed - Cr in normal range and within 360 days    Creat  Date Value Ref Range Status  10/30/2013 0.98 0.50 - 1.10 mg/dL Final   Creatinine, Ser  Date Value Ref Range Status  06/28/2019 0.96 0.57 - 1.00 mg/dL Final         Passed - HBA1C is between 0 and 7.9 and within 180 days    Hemoglobin A1C  Date Value Ref Range Status  06/28/2019 6.4 (A) 4.0 - 5.6 % Final   Hgb A1c MFr Bld  Date Value Ref Range Status  04/20/2018 7.2 (H) 4.8 - 5.6 % Final    Comment:             Prediabetes: 5.7 - 6.4          Diabetes: >6.4          Glycemic control for adults with diabetes: <7.0          Passed - eGFR in normal range and within 360 days    GFR, Est African American  Date Value Ref Range Status  10/30/2013 72 mL/min Final   GFR calc Af Amer  Date Value Ref Range Status  06/28/2019 71 >59 mL/min/1.73 Final    Comment:    **[facility] currently reports eGFR in compliance with the current**   recommendations of the [employer]. [facility] will   update reporting as new guidelines are published from the NKF-ASN   Task force.    GFR, Est Non African American  Date Value Ref Range Status  10/30/2013 62 mL/min Final    Comment:      The estimated GFR is a calculation valid for adults (>=33 years old) that uses the CKD-EPI algorithm to adjust for age and sex. It is   not to be used for children, pregnant women, hospitalized patients,    patients on dialysis, or with rapidly changing kidney function. According to the NKDEP, eGFR >89 is normal, 60-89 shows mild impairment, 30-59 shows moderate impairment, 15-29 shows severe impairment and <15 is ESRD.     GFR calc non Af Amer   Date Value Ref Range Status  06/28/2019 61 >59 mL/min/1.73 Final         Passed - Valid encounter within last 6 months    Recent Outpatient Visits          1 month ago Hypertension associated with diabetes [facility])   Primary Care at [facility], Ines Bloomer, MD   2 months ago Medicare annual wellness visit, subsequent   Primary Care at [facility], Arlie Solomons, MD   2 months ago Acute recurrent sinusitis, unspecified location   Primary Care at [facility], MD   6 months ago Acute recurrent sinusitis, unspecified location   Primary Care at [facility], [place], [place]   10 months ago Situational mixed anxiety and depressive disorder   Primary Care at [facility], [place], [place]

## 2019-08-10 ENCOUNTER — Encounter: Payer: Self-pay | Admitting: Emergency Medicine

## 2019-08-10 ENCOUNTER — Other Ambulatory Visit: Payer: Self-pay

## 2019-08-10 ENCOUNTER — Ambulatory Visit (INDEPENDENT_AMBULATORY_CARE_PROVIDER_SITE_OTHER): Payer: Medicare HMO | Admitting: Emergency Medicine

## 2019-08-10 VITALS — BP 142/79 | HR 109 | Temp 96.0°F | Resp 16 | Ht 69.0 in | Wt 241.0 lb

## 2019-08-10 DIAGNOSIS — Z6841 Body Mass Index (BMI) 40.0 and over, adult: Secondary | ICD-10-CM | POA: Diagnosis not present

## 2019-08-10 DIAGNOSIS — E1159 Type 2 diabetes mellitus with other circulatory complications: Secondary | ICD-10-CM

## 2019-08-10 DIAGNOSIS — N390 Urinary tract infection, site not specified: Secondary | ICD-10-CM

## 2019-08-10 DIAGNOSIS — I10 Essential (primary) hypertension: Secondary | ICD-10-CM | POA: Diagnosis not present

## 2019-08-10 DIAGNOSIS — E785 Hyperlipidemia, unspecified: Secondary | ICD-10-CM

## 2019-08-10 DIAGNOSIS — E1169 Type 2 diabetes mellitus with other specified complication: Secondary | ICD-10-CM | POA: Diagnosis not present

## 2019-08-10 LAB — POCT URINALYSIS DIP (MANUAL ENTRY)
Bilirubin, UA: NEGATIVE
Glucose, UA: NEGATIVE mg/dL
Ketones, POC UA: NEGATIVE mg/dL
Nitrite, UA: NEGATIVE
Protein Ur, POC: 100 mg/dL — AB
Spec Grav, UA: 1.025 (ref 1.010–1.025)
Urobilinogen, UA: 0.2 E.U./dL
pH, UA: 5.5 (ref 5.0–8.0)

## 2019-08-10 LAB — POC MICROSCOPIC URINALYSIS (UMFC): Mucus: ABSENT

## 2019-08-10 MED ORDER — CEFTRIAXONE SODIUM 1 G IJ SOLR
1.0000 g | Freq: Once | INTRAMUSCULAR | Status: AC
  Administered 2019-08-10: 1 g via INTRAMUSCULAR

## 2019-08-10 MED ORDER — CIPROFLOXACIN HCL 500 MG PO TABS: 500.0000 mg | ORAL_TABLET | Freq: Two times a day (BID) | ORAL | 0 refills | Status: AC

## 2019-08-10 NOTE — Patient Instructions (Addendum)
   If you have lab work done today you will be contacted with your lab results within the next 2 weeks.  If you have not heard from us then please contact us. The fastest way to get your results is to register for My Chart.   IF you received an x-ray today, you will receive an invoice from [facility]. Please contact [facility] at [phone] with questions or concerns regarding your invoice.   IF you received labwork today, you will receive an invoice from [facility]. Please contact [facility] at [phone] with questions or concerns regarding your invoice.   Our billing staff will not be able to assist you with questions regarding bills from these companies.  You will be contacted with the lab results as soon as they are available. The fastest way to get your results is to activate your My Chart account. Instructions are located on the last page of this paperwork. If you have not heard from us regarding the results in 2 weeks, please contact this office.     Urinary Tract Infection, Adult A urinary tract infection (UTI) is an infection of any part of the urinary tract. The urinary tract includes:  The kidneys.  The ureters.  The bladder.  The urethra. These organs make, store, and get rid of pee (urine) in the body. What are the causes? This is caused by germs (bacteria) in your genital area. These germs grow and cause swelling (inflammation) of your urinary tract. What increases the risk? You are more likely to develop this condition if:  You have a small, thin tube (catheter) to drain pee.  You cannot control when you pee or poop (incontinence).  You are female, and: ? You use these methods to prevent pregnancy:  A medicine that kills sperm (spermicide).  A device that blocks sperm (diaphragm). ? You have low levels of a female hormone (estrogen). ? You are pregnant.  You have genes that add to your risk.  You are sexually active.  You take  antibiotic medicines.  You have trouble peeing because of: ? A prostate that is bigger than normal, if you are female. ? A blockage in the part of your body that drains pee from the bladder (urethra). ? A kidney stone. ? A nerve condition that affects your bladder (neurogenic bladder). ? Not getting enough to drink. ? Not peeing often enough.  You have other conditions, such as: ? Diabetes. ? A weak disease-fighting system (immune system). ? Sickle cell disease. ? Gout. ? Injury of the spine. What are the signs or symptoms? Symptoms of this condition include:  Needing to pee right away (urgently).  Peeing often.  Peeing small amounts often.  Pain or burning when peeing.  Blood in the pee.  Pee that smells bad or not like normal.  Trouble peeing.  Pee that is cloudy.  Fluid coming from the vagina, if you are female.  Pain in the belly or lower back. Other symptoms include:  Throwing up (vomiting).  No urge to eat.  Feeling mixed up (confused).  Being tired and grouchy (irritable).  A fever.  Watery poop (diarrhea). How is this treated? This condition may be treated with:  Antibiotic medicine.  Other medicines.  Drinking enough water. Follow these instructions at home:  Medicines  Take over-the-counter and prescription medicines only as told by your doctor.  If you were prescribed an antibiotic medicine, take it as told by your doctor. Do not stop taking it even if   you start to feel better. General instructions  Make sure you: ? Pee until your bladder is empty. ? Do not hold pee for a long time. ? Empty your bladder after sex. ? Wipe from front to back after pooping if you are a female. Use each tissue one time when you wipe.  Drink enough fluid to keep your pee pale yellow.  Keep all follow-up visits as told by your doctor. This is important. Contact a doctor if:  You do not get better after 1-2 days.  Your symptoms go away and then come  back. Get help right away if:  You have very bad back pain.  You have very bad pain in your lower belly.  You have a fever.  You are sick to your stomach (nauseous).  You are throwing up. Summary  A urinary tract infection (UTI) is an infection of any part of the urinary tract.  This condition is caused by germs in your genital area.  There are many risk factors for a UTI. These include having a small, thin tube to drain pee and not being able to control when you pee or poop.  Treatment includes antibiotic medicines for germs.  Drink enough fluid to keep your pee pale yellow. This information is not intended to replace advice given to you by your health care provider. Make sure you discuss any questions you have with your health care provider. Document Revised: 12/30/2017 Document Reviewed: 07/22/2017 Elsevier Patient Education  2020 [employer]  

## 2019-08-10 NOTE — Progress Notes (Signed)
Jasmine Long 69 y.o.   Chief Complaint  Patient presents with  . Urinary Frequency    per pt started Sunday with burning and saw blood after wiping  . Back Pain    started Tuesday    HISTORY OF PRESENT ILLNESS: This is a 69 y.o. female complaining of urinary tract symptoms that started last Sunday with blood in the urine.  Progressively getting worse.  Has some right-sided low back pain that started 2 days ago.  Feels nauseous but no vomiting.  Denies fever or chills. Diabetic and hypertensive.  HPI   Prior to Admission medications   Medication Sig Start Date End Date Taking? Authorizing Provider  ALPRAZolam Prudy Feeler) 0.5 MG tablet Take 1 tablet (0.5 mg total) by mouth daily as needed for anxiety. 04/18/19  Yes Araceli Arango, Eilleen Kempf, MD  atorvastatin (LIPITOR) 20 MG tablet TAKE 1 TABLET BY MOUTH EVERY DAY 06/03/19  Yes [name], MD  buPROPion (WELLBUTRIN XL) 300 MG 24 hr tablet TAKE 1 TABLET BY MOUTH EVERY DAY 07/01/19  Yes Asher Babilonia, Eilleen Kempf, MD  clonazePAM (KLONOPIN) 1 MG tablet TAKE 1 TABLET BY MOUTH AT BEDTIME FOR SLEEP OR ANXIETY 04/24/19  Yes [name], MD  levothyroxine (SYNTHROID) 125 MCG tablet TAKE 1 TABLET (125 MCG TOTAL) BY MOUTH DAILY BEFORE BREAKFAST. 07/01/19  Yes Alla Sloma, Eilleen Kempf, MD  losartan (COZAAR) 100 MG tablet Take 1 tablet (100 mg total) by mouth daily. 06/07/19  Yes [name], MD  metFORMIN (GLUCOPHAGE) 1000 MG tablet TAKE 1 TABLET BY MOUTH TWICE A DAY WITH MEALS 07/30/19  Yes Katie Moch, Eilleen Kempf, MD  metoprolol succinate (TOPROL-XL) 25 MG 24 hr tablet TAKE 1 TABLET BY MOUTH EVERY DAY 06/03/19  Yes [place] Welcome, Eilleen Kempf, MD  montelukast (SINGULAIR) 10 MG tablet Take 1 tablet (10 mg total) by mouth at bedtime. 12/09/17  Yes Keshawna Dix, Eilleen Kempf, MD  nicotine (NICODERM CQ - DOSED IN MG/24 HOURS) 21 mg/24hr patch Place 1 patch (21 mg total) onto the skin daily. 04/20/18  Yes Farron Lafond, Eilleen Kempf, MD  hydrOXYzine (VISTARIL) 25 MG  capsule Take 25 mg by mouth 3 (three) times daily as needed (PRN for itching). Patient not taking: Reported on 08/10/2019    [provider]  buPROPion (WELLBUTRIN XL) 300 MG 24 hr tablet TAKE 1 TABLET BY MOUTH EVERY DAY 06/07/19   [name], MD    Not on File  Patient Active Problem List   Diagnosis Date Noted  . Chronic anxiety 06/28/2019  . History of peptic ulcer 04/30/2017  . Hyperlipidemia 12/11/2015  . Insomnia 12/11/2015  . Panic attack as reaction to stress 12/11/2015  . Current smoker 12/11/2015  . Chronic sinusitis 12/11/2015  . Dyslipidemia associated with type 2 diabetes mellitus (HCC) 05/15/2015  . Hypothyroid 04/29/2011  . BMI 40.0-44.9, adult (HCC) 04/29/2011  . Depression with anxiety 04/29/2011  . GERD (gastroesophageal reflux disease) 04/29/2011  . 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  . Nicotine addiction 04/29/2011  . Hypertension associated with diabetes (HCC) 04/29/2011  . Murmur, cardiac 04/29/2011    Past Medical History:  Diagnosis Date  . Allergy   . Anxiety   . Depression   . Heart murmur   . Hypertension   . Thyroid disease   . Ulcer     Past Surgical History:  Procedure Laterality Date  . BREAST SURGERY    . CESAREAN SECTION    . CHOLECYSTECTOMY    . NASAL SEPTUM SURGERY      Social  History   Socioeconomic History  . Marital status: Divorced    Spouse name: Not on file  . Number of children: Not on file  . Years of education: Not on file  . Highest education level: Not on file  Occupational History  . Occupation: Unemployed  Tobacco Use  . Smoking status: Current Every Day Smoker    Packs/day: 1.50    Years: 40.00    Pack years: 60.00    Types: Cigarettes    Last attempt to quit: 01/20/2012    Years since quitting: 7.5  . Smokeless tobacco: Never Used  . Tobacco comment: Pt doing E-cigarette as of 01/20/12  Vaping Use  . Vaping Use: Former  Substance and Sexual Activity  . Alcohol use: No  . Drug use: No   . Sexual activity: Never  Other Topics Concern  . Not on file  Social History Narrative   Patient on worker's compensation for an injury sustained on the job 5 years ago.   Social Determinants of Health   Financial Resource Strain:   . Difficulty of Paying Living Expenses:   Food Insecurity:   . Worried About Programme researcher, broadcasting/film/videounning Out of Food in the Last Year:   . Baristaan Out of Food in the Last Year:   Transportation Needs:   . Freight forwarderLack of Transportation (Medical):   Marland Kitchen. Lack of Transportation (Non-Medical):   Physical Activity:   . Days of Exercise per Week:   . Minutes of Exercise per Session:   Stress:   . Feeling of Stress :   Social Connections:   . Frequency of Communication with Friends and Family:   . Frequency of Social Gatherings with Friends and Family:   . Attends Religious Services:   . Active Member of Clubs or Organizations:   . Attends BankerClub or Organization Meetings:   Marland Kitchen. Marital Status:   Intimate Partner Violence:   . Fear of Current or Ex-Partner:   . Emotionally Abused:   Marland Kitchen. Physically Abused:   . Sexually Abused:     Family History  Problem Relation Age of Onset  . Lung cancer Mother   . Dementia Father   . Atrial fibrillation Father      Review of Systems  Constitutional: Negative.  Negative for chills and fever.  HENT: Negative.  Negative for congestion and sore throat.   Respiratory: Negative.  Negative for cough and shortness of breath.   Cardiovascular: Negative.  Negative for chest pain and palpitations.  Gastrointestinal: Positive for nausea. Negative for abdominal pain, blood in stool and vomiting.  Genitourinary: Positive for dysuria, frequency, hematuria and urgency. Negative for flank pain.  Skin: Negative.  Negative for rash.  Neurological: Negative.  Negative for dizziness and headaches.  All other systems reviewed and are negative.  Vitals:   08/10/19 1316  BP: (!) 142/79  Pulse: (!) 109  Resp: 16  Temp: (!) 96 F (35.6 C)  SpO2: 96%      Physical Exam Vitals reviewed.  Constitutional:      Appearance: Normal appearance.  HENT:     Head: Normocephalic.  Eyes:     Extraocular Movements: Extraocular movements intact.     Pupils: Pupils are equal, round, and reactive to light.  Cardiovascular:     Rate and Rhythm: Normal rate.  Pulmonary:     Effort: Pulmonary effort is normal.  Abdominal:     General: There is no distension.     Palpations: Abdomen is soft.     Tenderness: There  is no abdominal tenderness. There is no right CVA tenderness or left CVA tenderness.  Musculoskeletal:     Cervical back: Normal range of motion.  Skin:    General: Skin is warm and dry.     Capillary Refill: Capillary refill takes less than 2 seconds.  Neurological:     General: No focal deficit present.     Mental Status: She is alert and oriented to person, place, and time.  Psychiatric:        Mood and Affect: Mood normal.        Behavior: Behavior normal.    Results for orders placed or performed in visit on 08/10/19 (from the past 24 hour(s))  POCT urinalysis dipstick     Status: Abnormal   Collection Time: 08/10/19 11:58 AM  Result Value Ref Range   Color, UA yellow yellow   Clarity, UA cloudy (A) clear   Glucose, UA negative negative mg/dL   Bilirubin, UA negative negative   Ketones, POC UA negative negative mg/dL   Spec Grav, UA 6.314 9.702 - 1.025   Blood, UA moderate (A) negative   pH, UA 5.5 5.0 - 8.0   Protein Ur, POC =100 (A) negative mg/dL   Urobilinogen, UA 0.2 0.2 or 1.0 E.U./dL   Nitrite, UA Negative Negative   Leukocytes, UA Small (1+) (A) Negative  POCT Microscopic Urinalysis ([facility])     Status: Abnormal   Collection Time: 08/10/19 11:59 AM  Result Value Ref Range   WBC,UR,HPF,POC Many (A) None WBC/hpf   RBC,UR,HPF,POC Moderate (A) None RBC/hpf   Bacteria Few (A) None, Too numerous to count   Mucus Absent Absent   Epithelial Cells, UR Per Microscopy Few (A) None, Too numerous to count cells/hpf    A total of 30 minutes was spent with the patient, greater than 50% of which was in counseling/coordination of care regarding UTI and possible pyelonephritis, treatment including use of antibiotics side effects, review of most recent office visit notes, review of most recent blood work results including today's urinalysis, review of all medications, prognosis and need for follow-up if no better or worse during the next several days.   ASSESSMENT & PLAN: Kaylena was seen today for urinary frequency and back pain.  Diagnoses and all orders for this visit:  Acute UTI Comments: Early pyelonephritis Orders: -     POCT urinalysis dipstick -     POCT Microscopic Urinalysis ([facility]) -     Urine Culture -     Cancel: Comprehensive metabolic panel -     Cancel: POCT CBC -     cefTRIAXone (ROCEPHIN) injection 1 g -     ciprofloxacin (CIPRO) 500 MG tablet; Take 1 tablet (500 mg total) by mouth 2 (two) times daily for 7 days.  Hypertension associated with diabetes (HCC) -     HM Diabetes Foot Exam  Dyslipidemia associated with type 2 diabetes mellitus (HCC)    Patient Instructions       If you have lab work done today you will be contacted with your lab results within the next 2 weeks.  If you have not heard from [place] then please contact us. The fastest way to get your results is to register for My Chart.   IF you received an x-ray today, you will receive an invoice from [facility]. Please contact [facility] at [phone] with questions or concerns regarding your invoice.   IF you received labwork today, you will receive an invoice from [place]. Please contact  [facility] at [phone] with questions or concerns regarding your invoice.   Our billing staff will not be able to assist you with questions regarding bills from these companies.  You will be contacted with the lab results as soon as they are available. The fastest way to get your results is to activate your My  Chart account. Instructions are located on the last page of this paperwork. If you have not heard from [place] regarding the results in 2 weeks, please contact this office.     Urinary Tract Infection, Adult A urinary tract infection (UTI) is an infection of any part of the urinary tract. The urinary tract includes:  The kidneys.  The ureters.  The bladder.  The urethra. These organs make, store, and get rid of pee (urine) in the body. What are the causes? This is caused by germs (bacteria) in your genital area. These germs grow and cause swelling (inflammation) of your urinary tract. What increases the risk? You are more likely to develop this condition if:  You have a small, thin tube (catheter) to drain pee.  You cannot control when you pee or poop (incontinence).  You are female, and: ? You use these methods to prevent pregnancy:  A medicine that kills sperm (spermicide).  A device that blocks sperm (diaphragm). ? You have low levels of a female hormone (estrogen). ? You are pregnant.  You have genes that add to your risk.  You are sexually active.  You take antibiotic medicines.  You have trouble peeing because of: ? A prostate that is bigger than normal, if you are female. ? A blockage in the part of your body that drains pee from the bladder (urethra). ? A kidney stone. ? A nerve condition that affects your bladder (neurogenic bladder). ? Not getting enough to drink. ? Not peeing often enough.  You have other conditions, such as: ? Diabetes. ? A weak disease-fighting system (immune system). ? Sickle cell disease. ? Gout. ? Injury of the spine. What are the signs or symptoms? Symptoms of this condition include:  Needing to pee right away (urgently).  Peeing often.  Peeing small amounts often.  Pain or burning when peeing.  Blood in the pee.  Pee that smells bad or not like normal.  Trouble peeing.  Pee that is cloudy.  Fluid coming from the  vagina, if you are female.  Pain in the belly or lower back. Other symptoms include:  Throwing up (vomiting).  No urge to eat.  Feeling mixed up (confused).  Being tired and grouchy (irritable).  A fever.  Watery poop (diarrhea). How is this treated? This condition may be treated with:  Antibiotic medicine.  Other medicines.  Drinking enough water. Follow these instructions at home:  Medicines  Take over-the-counter and prescription medicines only as told by your doctor.  If you were prescribed an antibiotic medicine, take it as told by your doctor. Do not stop taking it even if you start to feel better. General instructions  Make sure you: ? Pee until your bladder is empty. ? Do not hold pee for a long time. ? Empty your bladder after sex. ? Wipe from front to back after pooping if you are a female. Use each tissue one time when you wipe.  Drink enough fluid to keep your pee pale yellow.  Keep all follow-up visits as told by your doctor. This is important. Contact a doctor if:  You do not get better after 1-2 days.  Your  symptoms go away and then come back. Get help right away if:  You have very bad back pain.  You have very bad pain in your lower belly.  You have a fever.  You are sick to your stomach (nauseous).  You are throwing up. Summary  A urinary tract infection (UTI) is an infection of any part of the urinary tract.  This condition is caused by germs in your genital area.  There are many risk factors for a UTI. These include having a small, thin tube to drain pee and not being able to control when you pee or poop.  Treatment includes antibiotic medicines for germs.  Drink enough fluid to keep your pee pale yellow. This information is not intended to replace advice given to you by your health care provider. Make sure you discuss any questions you have with your health care provider. Document Revised: 12/30/2017 Document Reviewed:  07/22/2017 Elsevier Patient Education  2020 Elsevier Inc.      Jasmine Barth, MD [facility]

## 2019-08-13 LAB — URINE CULTURE

## 2019-08-14 ENCOUNTER — Other Ambulatory Visit: Payer: Self-pay | Admitting: Emergency Medicine

## 2019-08-14 ENCOUNTER — Telehealth: Payer: Self-pay | Admitting: *Deleted

## 2019-08-14 DIAGNOSIS — N390 Urinary tract infection, site not specified: Secondary | ICD-10-CM

## 2019-08-14 MED ORDER — CEFUROXIME AXETIL 500 MG PO TABS: 500.0000 mg | ORAL_TABLET | Freq: Two times a day (BID) | ORAL | 0 refills | Status: AC

## 2019-08-14 NOTE — Telephone Encounter (Signed)
Spoke to patient about results of urine culture having E. Coli and the antibiotic Cipro was resistance to the E. Coli. Another antibiotic Ceftin was prescribed, patient was advised to take all of the antibiotic.

## 2019-08-21 ENCOUNTER — Telehealth: Payer: Self-pay | Admitting: Emergency Medicine

## 2019-08-21 NOTE — Telephone Encounter (Signed)
Patient is calling to ask Dr. Alvy Bimler she has finished two antibodics should she come in to follow up with a urine sample to ensure that the infection has cleared up? Please advise CB- [phone]

## 2019-08-22 ENCOUNTER — Other Ambulatory Visit: Payer: Self-pay

## 2019-08-22 DIAGNOSIS — N390 Urinary tract infection, site not specified: Secondary | ICD-10-CM

## 2019-08-22 NOTE — Telephone Encounter (Signed)
Called to inform pt but got VM pt can come in for nurse only visit for a dipstick to ensure urine is clear

## 2019-08-23 NOTE — Telephone Encounter (Signed)
Pt is scheduled for 08/24/19@10am  for nurse visit for urine sample.

## 2019-08-24 ENCOUNTER — Telehealth: Payer: Self-pay | Admitting: Emergency Medicine

## 2019-08-24 ENCOUNTER — Other Ambulatory Visit: Payer: Self-pay

## 2019-08-24 ENCOUNTER — Ambulatory Visit (INDEPENDENT_AMBULATORY_CARE_PROVIDER_SITE_OTHER): Payer: Medicare HMO | Admitting: Family Medicine

## 2019-08-24 DIAGNOSIS — N390 Urinary tract infection, site not specified: Secondary | ICD-10-CM | POA: Diagnosis not present

## 2019-08-24 LAB — POCT URINALYSIS DIP (MANUAL ENTRY)
Bilirubin, UA: NEGATIVE
Glucose, UA: NEGATIVE mg/dL
Ketones, POC UA: NEGATIVE mg/dL
Leukocytes, UA: NEGATIVE
Nitrite, UA: NEGATIVE
Protein Ur, POC: NEGATIVE mg/dL
Spec Grav, UA: 1.015 (ref 1.010–1.025)
Urobilinogen, UA: 0.2 E.U./dL
pH, UA: 7 (ref 5.0–8.0)

## 2019-08-24 NOTE — Telephone Encounter (Signed)
Pt came in to give her urine sample and wanted to know if provider could send her in her normal zpack that she gets when she gets sinus infections or if she would need to come in the office. Please advise.

## 2019-08-25 ENCOUNTER — Telehealth: Payer: Self-pay | Admitting: Emergency Medicine

## 2019-08-25 NOTE — Telephone Encounter (Signed)
Pt is requesting lab results and also some medication for a sinus infection. However, I do not see in the chart that this was discussed in the previous OV. I LVM for pt let her know that I will F/U with provider regarding her lab results and also to let her know that is this a new problem that has not been discussed during the OV then she will need to contact the office to schedule an appt for this before getting any medication.   Please Advise

## 2019-08-25 NOTE — Telephone Encounter (Signed)
No action needed already been handled

## 2019-08-25 NOTE — Telephone Encounter (Signed)
Pt called asking for lab results from yesterday's urine samepl and inquiring about an antibiotic for her sinus infection

## 2019-08-27 NOTE — Telephone Encounter (Signed)
UA was good, no infection. Needs to be seen if she thinks she has a sinus infection.

## 2019-08-28 NOTE — Telephone Encounter (Signed)
Pt called back wanting to talk to [place]. Pt sttates that she should have to have an appt. for sinus infection. Pt states that she has as seen by Sagardia numerous times regarding  Her sinuses . Please reach out to patient .

## 2019-08-28 NOTE — Telephone Encounter (Signed)
Left a msg on patient machine that per [place] if she have a sinus infection she need to be seen. This can also be an Photographer

## 2019-08-30 ENCOUNTER — Other Ambulatory Visit: Payer: Self-pay

## 2019-08-30 ENCOUNTER — Encounter: Payer: Self-pay | Admitting: Emergency Medicine

## 2019-08-30 ENCOUNTER — Telehealth (INDEPENDENT_AMBULATORY_CARE_PROVIDER_SITE_OTHER): Payer: Medicare HMO | Admitting: Emergency Medicine

## 2019-08-30 VITALS — Ht 69.0 in

## 2019-08-30 DIAGNOSIS — R0981 Nasal congestion: Secondary | ICD-10-CM

## 2019-08-30 DIAGNOSIS — J0101 Acute recurrent maxillary sinusitis: Secondary | ICD-10-CM | POA: Diagnosis not present

## 2019-08-30 MED ORDER — DOXYCYCLINE HYCLATE 100 MG PO TABS: 100.0000 mg | ORAL_TABLET | Freq: Two times a day (BID) | ORAL | 0 refills | Status: DC

## 2019-08-30 MED ORDER — GUAIFENESIN ER 600 MG PO TB12: 1200.0000 mg | ORAL_TABLET | Freq: Two times a day (BID) | ORAL | 1 refills | Status: DC

## 2019-08-30 MED ORDER — PREDNISONE 20 MG PO TABS: 40.0000 mg | ORAL_TABLET | Freq: Every day | ORAL | 0 refills | Status: AC

## 2019-08-30 NOTE — Progress Notes (Signed)
Telemedicine Encounter- SOAP NOTE Established Patient  This telephone encounter was conducted with the patient's (or proxy's) verbal consent via audio telecommunications: yes/no: Yes Patient was instructed to have this encounter in a suitably private space; and to only have persons present to whom they give permission to participate. In addition, patient identity was confirmed by use of name plus two identifiers (DOB and address).  I discussed the limitations, risks, security and privacy concerns of performing an evaluation and management service by telephone and the availability of in person appointments. I also discussed with the patient that there may be a patient responsible charge related to this service. The patient expressed understanding and agreed to proceed.  I spent a total of TIME; 0 MIN TO 60 MIN: 20 minutes talking with the patient or their proxy.  Chief Complaint  Patient presents with  . Sinusitis    acute sinus issue- going on almost 2 weeks   . nasal drainage    Subjective   Jasmine Long is a 69 y.o. female established patient. Telephone visit today complaining of sinus infection.  Has history of recurrent sinus infections for many years.  Has seen multiple ENT doctors in the past with multiple different procedures done. Complaining of sinus pressure and congestion.  Denies fever or chills.  Able to eat and drink.  Denies nausea or vomiting.  No other associated symptoms.  HPI   Patient Active Problem List   Diagnosis Date Noted  . Chronic anxiety 06/28/2019  . History of peptic ulcer 04/30/2017  . Hyperlipidemia 12/11/2015  . Insomnia 12/11/2015  . Panic attack as reaction to stress 12/11/2015  . Current smoker 12/11/2015  . Chronic sinusitis 12/11/2015  . Dyslipidemia associated with type 2 diabetes mellitus (HCC) 05/15/2015  . Hypothyroid 04/29/2011  . BMI 40.0-44.9, adult (HCC) 04/29/2011  . Depression with anxiety 04/29/2011  . GERD (gastroesophageal  reflux disease) 04/29/2011  . 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  . Nicotine addiction 04/29/2011  . Hypertension associated with diabetes (HCC) 04/29/2011  . Murmur, cardiac 04/29/2011    Past Medical History:  Diagnosis Date  . Allergy   . Anxiety   . Depression   . Heart murmur   . Hypertension   . Thyroid disease   . Ulcer     Current Outpatient Medications  Medication Sig Dispense Refill  . ALPRAZolam (XANAX) 0.5 MG tablet Take 1 tablet (0.5 mg total) by mouth daily as needed for anxiety. 30 tablet 1  . atorvastatin (LIPITOR) 20 MG tablet TAKE 1 TABLET BY MOUTH EVERY DAY 90 tablet 3  . buPROPion (WELLBUTRIN XL) 300 MG 24 hr tablet TAKE 1 TABLET BY MOUTH EVERY DAY 90 tablet 1  . clonazePAM (KLONOPIN) 1 MG tablet TAKE 1 TABLET BY MOUTH AT BEDTIME FOR SLEEP OR ANXIETY 30 tablet 1  . hydrOXYzine (VISTARIL) 25 MG capsule Take 25 mg by mouth 3 (three) times daily as needed (PRN for itching). (Patient not taking: Reported on 08/10/2019)    . levothyroxine (SYNTHROID) 125 MCG tablet TAKE 1 TABLET (125 MCG TOTAL) BY MOUTH DAILY BEFORE BREAKFAST. 90 tablet 0  . losartan (COZAAR) 100 MG tablet Take 1 tablet (100 mg total) by mouth daily. 90 tablet 1  . metFORMIN (GLUCOPHAGE) 1000 MG tablet TAKE 1 TABLET BY MOUTH TWICE A DAY WITH MEALS 180 tablet 1  . metoprolol succinate (TOPROL-XL) 25 MG 24 hr tablet TAKE 1 TABLET BY MOUTH EVERY DAY 90 tablet 1  . montelukast (SINGULAIR) 10 MG tablet Take  1 tablet (10 mg total) by mouth at bedtime. (Patient not taking: Reported on 08/10/2019) 30 tablet 3  . nicotine (NICODERM CQ - DOSED IN MG/24 HOURS) 21 mg/24hr patch Place 1 patch (21 mg total) onto the skin daily. 28 patch 3   No current facility-administered medications for this visit.    Not on File  Social History   Socioeconomic History  . Marital status: Divorced    Spouse name: Not on file  . Number of children: Not on file  . Years of education: Not on file  . Highest education level: Not  on file  Occupational History  . Occupation: Unemployed  Tobacco Use  . Smoking status: Current Every Day Smoker    Packs/day: 1.50    Years: 40.00    Pack years: 60.00    Types: Cigarettes    Last attempt to quit: 01/20/2012    Years since quitting: 7.6  . Smokeless tobacco: Never Used  . Tobacco comment: Pt doing E-cigarette as of 01/20/12  Vaping Use  . Vaping Use: Former  Substance and Sexual Activity  . Alcohol use: No  . Drug use: No  . Sexual activity: Never  Other Topics Concern  . Not on file  Social History Narrative   Patient on worker's compensation for an injury sustained on the job 5 years ago.   Social Determinants of Health   Financial Resource Strain:   . Difficulty of Paying Living Expenses:   Food Insecurity:   . Worried About Programme researcher, broadcasting/film/video in the Last Year:   . Barista in the Last Year:   Transportation Needs:   . Freight forwarder (Medical):   Marland Kitchen Lack of Transportation (Non-Medical):   Physical Activity:   . Days of Exercise per Week:   . Minutes of Exercise per Session:   Stress:   . Feeling of Stress :   Social Connections:   . Frequency of Communication with Friends and Family:   . Frequency of Social Gatherings with Friends and Family:   . Attends Religious Services:   . Active Member of Clubs or Organizations:   . Attends Banker Meetings:   Marland Kitchen Marital Status:   Intimate Partner Violence:   . Fear of Current or Ex-Partner:   . Emotionally Abused:   Marland Kitchen Physically Abused:   . Sexually Abused:     Review of Systems  Constitutional: Negative.  Negative for chills and fever.  HENT: Positive for congestion and sinus pain. Negative for sore throat.   Respiratory: Negative.  Negative for cough and shortness of breath.   Cardiovascular: Negative.  Negative for chest pain and palpitations.  Gastrointestinal: Negative.  Negative for abdominal pain, diarrhea, nausea and vomiting.  Genitourinary: Negative for dysuria  and hematuria.  Musculoskeletal: Negative.  Negative for back pain, myalgias and neck pain.  Skin: Negative.  Negative for rash.  Neurological: Negative.  Negative for dizziness and headaches.  All other systems reviewed and are negative.   Objective  Alert and oriented x3 in no apparent respiratory distress. Vitals as reported by the patient: Today's Vitals   08/30/19 1620  Height: 5\' 9"  (1.753 m)    There are no diagnoses linked to this encounter. Jasmine Long was seen today for sinusitis and nasal drainage.  Diagnoses and all orders for this visit:  Acute recurrent maxillary sinusitis -     doxycycline (VIBRA-TABS) 100 MG tablet; Take 1 tablet (100 mg total) by mouth 2 (two) times daily.  Sinus congestion -     predniSONE (DELTASONE) 20 MG tablet; Take 2 tablets (40 mg total) by mouth daily with breakfast for 5 days. -     guaiFENesin (MUCINEX) 600 MG 12 hr tablet; Take 2 tablets (1,200 mg total) by mouth 2 (two) times daily.     I discussed the assessment and treatment plan with the patient. The patient was provided an opportunity to ask questions and all were answered. The patient agreed with the plan and demonstrated an understanding of the instructions.   The patient was advised to call back or seek an in-person evaluation if the symptoms worsen or if the condition fails to improve as anticipated.  I provided 20 minutes of non-face-to-face time during this encounter.  [name], MD  Primary [facility] at [facility]

## 2019-08-30 NOTE — Patient Instructions (Signed)
° ° ° °  If you have lab work done today you will be contacted with your lab results within the next 2 weeks.  If you have not heard from us then please contact us. The fastest way to get your results is to register for My Chart. ° ° °IF you received an x-ray today, you will receive an invoice from [facility]. Please contact [facility] at [phone] with questions or concerns regarding your invoice.  ° °IF you received labwork today, you will receive an invoice from [facility]. Please contact [facility] at [phone] with questions or concerns regarding your invoice.  ° °Our billing staff will not be able to assist you with questions regarding bills from these companies. ° °You will be contacted with the lab results as soon as they are available. The fastest way to get your results is to activate your My Chart account. Instructions are located on the last page of this paperwork. If you have not heard from us regarding the results in 2 weeks, please contact this office. °  ° ° ° °

## 2019-10-03 ENCOUNTER — Ambulatory Visit: Payer: Medicare HMO | Admitting: Emergency Medicine

## 2019-10-04 ENCOUNTER — Encounter: Payer: Self-pay | Admitting: Emergency Medicine

## 2019-10-04 ENCOUNTER — Ambulatory Visit (INDEPENDENT_AMBULATORY_CARE_PROVIDER_SITE_OTHER): Payer: Medicare HMO

## 2019-10-04 ENCOUNTER — Ambulatory Visit (INDEPENDENT_AMBULATORY_CARE_PROVIDER_SITE_OTHER): Payer: Medicare HMO | Admitting: Emergency Medicine

## 2019-10-04 ENCOUNTER — Other Ambulatory Visit: Payer: Self-pay

## 2019-10-04 VITALS — BP 146/78 | HR 82 | Temp 96.7°F | Ht 69.0 in | Wt 247.0 lb

## 2019-10-04 DIAGNOSIS — M25531 Pain in right wrist: Secondary | ICD-10-CM

## 2019-10-04 DIAGNOSIS — M67431 Ganglion, right wrist: Secondary | ICD-10-CM | POA: Diagnosis not present

## 2019-10-04 DIAGNOSIS — M19041 Primary osteoarthritis, right hand: Secondary | ICD-10-CM | POA: Diagnosis not present

## 2019-10-04 NOTE — Progress Notes (Signed)
Jasmine Long 69 y.o.   Chief Complaint  Patient presents with  . knot on back of R hand    x 2 weeks     HISTORY OF PRESENT ILLNESS: This is a 68 y.o. female complaining of lump to back of right hand for several weeks. Denies injury. No other complaints or medical concerns.  HPI   Prior to Admission medications   Medication Sig Start Date End Date Taking? Authorizing Provider  ALPRAZolam Prudy Feeler) 0.5 MG tablet Take 1 tablet (0.5 mg total) by mouth daily as needed for anxiety. 04/18/19  Yes Taedyn Glasscock, Eilleen Kempf, MD  atorvastatin (LIPITOR) 20 MG tablet TAKE 1 TABLET BY MOUTH EVERY DAY 06/03/19  Yes [name], MD  buPROPion (WELLBUTRIN XL) 300 MG 24 hr tablet TAKE 1 TABLET BY MOUTH EVERY DAY 07/01/19  Yes Claudetta Sallie, Eilleen Kempf, MD  clonazePAM (KLONOPIN) 1 MG tablet TAKE 1 TABLET BY MOUTH AT BEDTIME FOR SLEEP OR ANXIETY 04/24/19  Yes [name], MD  levothyroxine (SYNTHROID) 125 MCG tablet TAKE 1 TABLET (125 MCG TOTAL) BY MOUTH DAILY BEFORE BREAKFAST. 07/01/19  Yes Tyshea Imel, Eilleen Kempf, MD  losartan (COZAAR) 100 MG tablet Take 1 tablet (100 mg total) by mouth daily. 06/07/19  Yes [name], MD  metFORMIN (GLUCOPHAGE) 1000 MG tablet TAKE 1 TABLET BY MOUTH TWICE A DAY WITH MEALS 07/30/19  Yes Devyn Griffing, Eilleen Kempf, MD  doxycycline (VIBRA-TABS) 100 MG tablet Take 1 tablet (100 mg total) by mouth 2 (two) times daily. Patient not taking: Reported on 10/04/2019 08/30/19   [name], MD  guaiFENesin (MUCINEX) 600 MG 12 hr tablet Take 2 tablets (1,200 mg total) by mouth 2 (two) times daily. Patient not taking: Reported on 10/04/2019 08/30/19   [name], MD  metoprolol succinate (TOPROL-XL) 25 MG 24 hr tablet TAKE 1 TABLET BY MOUTH EVERY DAY Patient not taking: Reported on 10/04/2019 06/03/19   [name], MD  montelukast (SINGULAIR) 10 MG tablet Take 1 tablet (10 mg total) by mouth at bedtime. Patient not taking: Reported on 10/04/2019 12/09/17    [name], MD  nicotine (NICODERM CQ - DOSED IN MG/24 HOURS) 21 mg/24hr patch Place 1 patch (21 mg total) onto the skin daily. Patient not taking: Reported on 10/04/2019 04/20/18   [name], MD  buPROPion (WELLBUTRIN XL) 300 MG 24 hr tablet TAKE 1 TABLET BY MOUTH EVERY DAY 06/07/19   [name], MD    Not on File  Patient Active Problem List   Diagnosis Date Noted  . Chronic anxiety 06/28/2019  . History of peptic ulcer 04/30/2017  . Hyperlipidemia 12/11/2015  . Insomnia 12/11/2015  . Panic attack as reaction to stress 12/11/2015  . Current smoker 12/11/2015  . Chronic sinusitis 12/11/2015  . Dyslipidemia associated with type 2 diabetes mellitus (HCC) 05/15/2015  . Hypothyroid 04/29/2011  . BMI 40.0-44.9, adult (HCC) 04/29/2011  . Depression with anxiety 04/29/2011  . GERD (gastroesophageal reflux disease) 04/29/2011  . 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  . Nicotine addiction 04/29/2011  . Hypertension associated with diabetes (HCC) 04/29/2011  . Murmur, cardiac 04/29/2011    Past Medical History:  Diagnosis Date  . Allergy   . Anxiety   . Depression   . Heart murmur   . Hypertension   . Thyroid disease   . Ulcer     Past Surgical History:  Procedure Laterality Date  . BREAST SURGERY    . CESAREAN SECTION    . CHOLECYSTECTOMY    .  NASAL SEPTUM SURGERY      Social History   Socioeconomic History  . Marital status: Divorced    Spouse name: Not on file  . Number of children: Not on file  . Years of education: Not on file  . Highest education level: Not on file  Occupational History  . Occupation: Unemployed  Tobacco Use  . Smoking status: Current Every Day Smoker    Packs/day: 1.50    Years: 40.00    Pack years: 60.00    Types: Cigarettes    Last attempt to quit: 01/20/2012    Years since quitting: 7.7  . Smokeless tobacco: Never Used  . Tobacco comment: Pt doing E-cigarette as of 01/20/12  Vaping Use  . Vaping Use: Former    Substance and Sexual Activity  . Alcohol use: No  . Drug use: No  . Sexual activity: Never  Other Topics Concern  . Not on file  Social History Narrative   Patient on worker's compensation for an injury sustained on the job 5 years ago.   Social Determinants of Health   Financial Resource Strain:   . Difficulty of Paying Living Expenses: Not on file  Food Insecurity:   . Worried About Programme researcher, broadcasting/film/video in the Last Year: Not on file  . Ran Out of Food in the Last Year: Not on file  Transportation Needs:   . Lack of Transportation (Medical): Not on file  . Lack of Transportation (Non-Medical): Not on file  Physical Activity:   . Days of Exercise per Week: Not on file  . Minutes of Exercise per Session: Not on file  Stress:   . Feeling of Stress : Not on file  Social Connections:   . Frequency of Communication with Friends and Family: Not on file  . Frequency of Social Gatherings with Friends and Family: Not on file  . Attends Religious Services: Not on file  . Active Member of Clubs or Organizations: Not on file  . Attends Banker Meetings: Not on file  . Marital Status: Not on file  Intimate Partner Violence:   . Fear of Current or Ex-Partner: Not on file  . Emotionally Abused: Not on file  . Physically Abused: Not on file  . Sexually Abused: Not on file    Family History  Problem Relation Age of Onset  . Lung cancer Mother   . Dementia Father   . Atrial fibrillation Father      Review of Systems  Constitutional: Negative.  Negative for chills and fever.  HENT: Negative.  Negative for congestion and sore throat.   Respiratory: Negative.  Negative for cough and shortness of breath.   Cardiovascular: Negative.  Negative for chest pain and palpitations.  Gastrointestinal: Negative.  Negative for abdominal pain, nausea and vomiting.  Genitourinary: Negative for dysuria and hematuria.  Skin: Negative.  Negative for rash.  Neurological: Negative for  dizziness and headaches.  All other systems reviewed and are negative.     Today's Vitals   10/04/19 1047  BP: (!) 146/78  Pulse: 82  Temp: (!) 96.7 F (35.9 C)  SpO2: 95%  Weight: 247 lb (112 kg)  Height: 5\' 9"  (1.753 m)   Body mass index is 36.48 kg/m.  Physical Exam Vitals reviewed.  Constitutional:      Appearance: Normal appearance.  HENT:     Head: Normocephalic.  Eyes:     Extraocular Movements: Extraocular movements intact.  Cardiovascular:     Rate and  Rhythm: Normal rate.  Pulmonary:     Effort: Pulmonary effort is normal.  Musculoskeletal:        General: Normal range of motion.     Cervical back: Normal range of motion.  Skin:    Capillary Refill: Capillary refill takes less than 2 seconds.     Comments: Right hand: Small ganglionic cyst proximal second metacarpal bone area  Neurological:     General: No focal deficit present.     Mental Status: She is alert and oriented to person, place, and time.  Psychiatric:        Mood and Affect: Mood normal.        Behavior: Behavior normal.    DG Hand Complete Right  Result Date: 10/04/2019 CLINICAL DATA:  Right wrist pain.  Ganglion cyst. EXAM: RIGHT HAND - COMPLETE 3+ VIEW COMPARISON:  None. FINDINGS: No acute bony or joint abnormality is identified. 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 Soft tissues are negative. IMPRESSION: Multifocal osteoarthritis.  Otherwise negative. Electronically Signed   By: Drusilla Kannerhomas  Dalessio M.D.   On: 10/04/2019 11:55     ASSESSMENT & PLAN: Dewayne Hatchnn was seen today for knot on back of r hand.  Diagnoses and all orders for this visit:  Ganglion cyst of wrist, right -     DG Hand Complete Right    Patient Instructions       If you have lab work done today you will be contacted with your lab results within the next 2 weeks.  If you have not heard from us then please contact us. The fastest way to get  your results is to register for My Chart.   IF you received an x-ray today, you will receive an [facility]. Please contact [facility] at [phone] with questions or concerns regarding your invoice.   IF you received labwork today, you will receive an invoice from [facility]. Please contact [facility] at [phone] with questions or concerns regarding your invoice.   Our billing staff will not be able to assist you with questions regarding bills from these companies.  You will be contacted with the lab results as soon as they are available. The fastest way to get your results is to activate your My Chart account. Instructions are located on the last page of this paperwork. If you have not heard from us regarding the results in 2 weeks, please contact this office.      Ganglion Cyst  A ganglion cyst is a non-cancerous, fluid-filled lump that occurs near a joint or tendon. The cyst grows out of a joint or the lining of a tendon. Ganglion cysts most often develop in the hand or wrist, but they can also develop in the shoulder, elbow, hip, knee, ankle, or foot. Ganglion cysts are ball-shaped or egg-shaped. Their size can range from the size of a pea to larger than a grape. Increased activity may cause the cyst to get bigger because more fluid starts to build up. What are the causes? The exact cause of this condition is not known, but it may be related to:  Inflammation or irritation around the joint.  An injury.  Repetitive movements or overuse.  Arthritis. What increases the risk? You are more likely to develop this condition if:  You are a woman.  You are 6415-67 years old. What are the signs or symptoms? The main symptom of this condition is a lump. It most often appears  on the hand or wrist. In many cases, there are no other symptoms, but a cyst can sometimes cause:  Tingling.  Pain.  Numbness.  Muscle weakness.  Weak grip.  Less range of  motion in a joint. How is this diagnosed? Ganglion cysts are usually diagnosed based on a physical exam. Your health care provider will feel the lump and may shine a light next to it. If it is a ganglion cyst, the light will likely shine through it. Your health care provider may order an X-ray, ultrasound, or MRI to rule out other conditions. How is this treated? Ganglion cysts often go away on their own without treatment. If you have pain or other symptoms, treatment may be needed. Treatment is also needed if the ganglion cyst limits your movement or if it gets infected. Treatment may include:  Wearing a brace or splint on your wrist or finger.  Taking anti-inflammatory medicine.  Having fluid drained from the lump with a needle (aspiration).  Getting a steroid injected into the joint.  Having surgery to remove the ganglion cyst.  Placing a pad on your shoe or wearing shoes that will not rub against the cyst if it is on your foot. Follow these instructions at home:  Do not press on the ganglion cyst, poke it with a needle, or hit it.  Take over-the-counter and prescription medicines only as told by your health care provider.  If you have a brace or splint: ? Wear it as told by your health care provider. ? Remove it as told by your health care provider. Ask if you need to remove it when you take a shower or a bath.  Watch your ganglion cyst for any changes.  Keep all follow-up visits as told by your health care provider. This is important. Contact a health care provider if:  Your ganglion cyst becomes larger or more painful.  You have pus coming from the lump.  You have weakness or numbness in the affected area.  You have a fever or chills. Get help right away if:  You have a fever and have any of these in the cyst area: ? Increased redness. ? Red streaks. ? Swelling. Summary  A ganglion cyst is a non-cancerous, fluid-filled lump that occurs near a joint or  tendon.  Ganglion cysts most often develop in the hand or wrist, but they can also develop in the shoulder, elbow, hip, knee, ankle, or foot.  Ganglion cysts often go away on their own without treatment. This information is not intended to replace advice given to you by your health care provider. Make sure you discuss any questions you have with your health care provider. Document Revised: 12/25/2016 Document Reviewed: 09/11/2016 Elsevier Patient Education  2020 Elsevier Inc.      Edwina Barth, MD [facility]

## 2019-10-04 NOTE — Patient Instructions (Addendum)
   If you have lab work done today you will be contacted with your lab results within the next 2 weeks.  If you have not heard from us then please contact us. The fastest way to get your results is to register for My Chart.   IF you received an x-ray today, you will receive an invoice from [facility]. Please contact [facility] at [phone] with questions or concerns regarding your invoice.   IF you received labwork today, you will receive an invoice from [facility]. Please contact [facility] at [phone] with questions or concerns regarding your invoice.   Our billing staff will not be able to assist you with questions regarding bills from these companies.  You will be contacted with the lab results as soon as they are available. The fastest way to get your results is to activate your My Chart account. Instructions are located on the last page of this paperwork. If you have not heard from us regarding the results in 2 weeks, please contact this office.     Ganglion Cyst  A ganglion cyst is a non-cancerous, fluid-filled lump that occurs near a joint or tendon. The cyst grows out of a joint or the lining of a tendon. Ganglion cysts most often develop in the hand or wrist, but they can also develop in the shoulder, elbow, hip, knee, ankle, or foot. Ganglion cysts are ball-shaped or egg-shaped. Their size can range from the size of a pea to larger than a grape. Increased activity may cause the cyst to get bigger because more fluid starts to build up. What are the causes? The exact cause of this condition is not known, but it may be related to:  Inflammation or irritation around the joint.  An injury.  Repetitive movements or overuse.  Arthritis. What increases the risk? You are more likely to develop this condition if:  You are a woman.  You are 15-40 years old. What are the signs or symptoms? The main symptom of this condition is a lump. It most often appears  on the hand or wrist. In many cases, there are no other symptoms, but a cyst can sometimes cause:  Tingling.  Pain.  Numbness.  Muscle weakness.  Weak grip.  Less range of motion in a joint. How is this diagnosed? Ganglion cysts are usually diagnosed based on a physical exam. Your health care provider will feel the lump and may shine a light next to it. If it is a ganglion cyst, the light will likely shine through it. Your health care provider may order an X-ray, ultrasound, or MRI to rule out other conditions. How is this treated? Ganglion cysts often go away on their own without treatment. If you have pain or other symptoms, treatment may be needed. Treatment is also needed if the ganglion cyst limits your movement or if it gets infected. Treatment may include:  Wearing a brace or splint on your wrist or finger.  Taking anti-inflammatory medicine.  Having fluid drained from the lump with a needle (aspiration).  Getting a steroid injected into the joint.  Having surgery to remove the ganglion cyst.  Placing a pad on your shoe or wearing shoes that will not rub against the cyst if it is on your foot. Follow these instructions at home:  Do not press on the ganglion cyst, poke it with a needle, or hit it.  Take over-the-counter and prescription medicines only as told by your health care provider.  If you have   have a brace or splint: ? Wear it as told by your health care provider. ? Remove it as told by your health care provider. Ask if you need to remove it when you take a shower or a bath.  Watch your ganglion cyst for any changes.  Keep all follow-up visits as told by your health care provider. This is important. Contact a health care provider if:  Your ganglion cyst becomes larger or more painful.  You have pus coming from the lump.  You have weakness or numbness in the affected area.  You have a fever or chills. Get help right away if:  You have a fever and  have any of these in the cyst area: ? Increased redness. ? Red streaks. ? Swelling. Summary  A ganglion cyst is a non-cancerous, fluid-filled lump that occurs near a joint or tendon.  Ganglion cysts most often develop in the hand or wrist, but they can also develop in the shoulder, elbow, hip, knee, ankle, or foot.  Ganglion cysts often go away on their own without treatment. This information is not intended to replace advice given to you by your health care provider. Make sure you discuss any questions you have with your health care provider. Document Revised: 12/25/2016 Document Reviewed: 09/11/2016 Elsevier Patient Education  2020 [employer].

## 2019-10-17 ENCOUNTER — Other Ambulatory Visit: Payer: Self-pay | Admitting: Emergency Medicine

## 2019-10-17 DIAGNOSIS — E079 Disorder of thyroid, unspecified: Secondary | ICD-10-CM

## 2019-10-17 NOTE — Telephone Encounter (Signed)
Requested Prescriptions  Pending Prescriptions Disp Refills   levothyroxine (SYNTHROID) 125 MCG tablet [Pharmacy Med Name: LEVOTHYROXINE 125 MCG TABLET] 90 tablet 2    Sig: TAKE 1 TABLET (125 MCG TOTAL) BY MOUTH DAILY BEFORE BREAKFAST.     Endocrinology:  Hypothyroid Agents Failed - 10/17/2019  9:51 PM      Failed - TSH needs to be rechecked within 3 months after an abnormal result. Refill until TSH is due.      Passed - TSH in normal range and within 360 days    TSH  Date Value Ref Range Status  06/28/2019 1.270 0.450 - 4.500 uIU/mL Final         Passed - Valid encounter within last 12 months    Recent Outpatient Visits          1 week ago Ganglion cyst of wrist, right   [facility] at [facility], Eilleen Kempf, MD   1 month ago Acute recurrent maxillary sinusitis   [facility] Care at [facility], Eilleen Kempf, MD   1 month ago Acute UTI   [facility] at [facility], Meda Coffee, MD   2 months ago Acute UTI   Primary Care at [facility], Trent Woods, MD   3 months ago Hypertension associated with diabetes [facility])   Primary Care at [facility], Eilleen Kempf, MD

## 2019-10-27 ENCOUNTER — Telehealth: Payer: Self-pay | Admitting: Emergency Medicine

## 2019-10-27 NOTE — Telephone Encounter (Signed)
Pt currently is seeking a referral for a neurologist /   Patient is needing a name of a neurologist that she can go to . Patient states that she trust her Providers judgement.  Please advise

## 2019-10-27 NOTE — Telephone Encounter (Signed)
Pt needs appt. We do not have neurological concerns in the last 2 visits need to discuss reason before we can refer.

## 2019-10-30 NOTE — Telephone Encounter (Signed)
Called pt to make appt. Pts insurance doesn't need a referral appt from her PCP. Pt just would like the provider to give her a couple names for neurologist so it saves her some time from searching. Please advise.

## 2019-10-30 NOTE — Telephone Encounter (Signed)
Spoke with pt she will double check her insurance doesn't require her to have a referral and then will call and schedule if necessary

## 2019-11-01 ENCOUNTER — Ambulatory Visit (INDEPENDENT_AMBULATORY_CARE_PROVIDER_SITE_OTHER): Payer: Medicare HMO | Admitting: Emergency Medicine

## 2019-11-01 ENCOUNTER — Encounter: Payer: Self-pay | Admitting: Emergency Medicine

## 2019-11-01 ENCOUNTER — Other Ambulatory Visit: Payer: Self-pay

## 2019-11-01 VITALS — BP 160/80 | HR 86 | Temp 97.3°F | Ht 68.0 in | Wt 249.0 lb

## 2019-11-01 DIAGNOSIS — J0101 Acute recurrent maxillary sinusitis: Secondary | ICD-10-CM

## 2019-11-01 DIAGNOSIS — G5792 Unspecified mononeuropathy of left lower limb: Secondary | ICD-10-CM

## 2019-11-01 DIAGNOSIS — R202 Paresthesia of skin: Secondary | ICD-10-CM

## 2019-11-01 MED ORDER — DOXYCYCLINE HYCLATE 100 MG PO TABS: 100.0000 mg | ORAL_TABLET | Freq: Two times a day (BID) | ORAL | 0 refills | Status: DC

## 2019-11-01 NOTE — Patient Instructions (Addendum)
   If you have lab work done today you will be contacted with your lab results within the next 2 weeks.  If you have not heard from us then please contact us. The fastest way to get your results is to register for My Chart.   IF you received an x-ray today, you will receive an invoice from [facility]. Please contact  Radiology at [phone] with questions or concerns regarding your invoice.   IF you received labwork today, you will receive an invoice from [facility]. Please contact [facility] at [phone] with questions or concerns regarding your invoice.   Our billing staff will not be able to assist you with questions regarding bills from these companies.  You will be contacted with the lab results as soon as they are available. The fastest way to get your results is to activate your My Chart account. Instructions are located on the last page of this paperwork. If you have not heard from us regarding the results in 2 weeks, please contact this office.     Health Maintenance After Age 65 After age 65, you are at a higher risk for certain long-term diseases and infections as well as injuries from falls. Falls are a major cause of broken bones and head injuries in people who are older than age 65. Getting regular preventive care can help to keep you healthy and well. Preventive care includes getting regular testing and making lifestyle changes as recommended by your health care provider. Talk with your health care provider about:  Which screenings and tests you should have. A screening is a test that checks for a disease when you have no symptoms.  A diet and exercise plan that is right for you. What should I know about screenings and tests to prevent falls? Screening and testing are the best ways to find a health problem early. Early diagnosis and treatment give you the best chance of managing medical conditions that are common after age 65. Certain conditions and  lifestyle choices may make you more likely to have a fall. Your health care provider may recommend:  Regular vision checks. Poor vision and conditions such as cataracts can make you more likely to have a fall. If you wear glasses, make sure to get your prescription updated if your vision changes.  Medicine review. Work with your health care provider to regularly review all of the medicines you are taking, including over-the-counter medicines. Ask your health care provider about any side effects that may make you more likely to have a fall. Tell your health care provider if any medicines that you take make you feel dizzy or sleepy.  Osteoporosis screening. Osteoporosis is a condition that causes the bones to get weaker. This can make the bones weak and cause them to break more easily.  Blood pressure screening. Blood pressure changes and medicines to control blood pressure can make you feel dizzy.  Strength and balance checks. Your health care provider may recommend certain tests to check your strength and balance while standing, walking, or changing positions.  Foot health exam. Foot pain and numbness, as well as not wearing proper footwear, can make you more likely to have a fall.  Depression screening. You may be more likely to have a fall if you have a fear of falling, feel emotionally low, or feel unable to do activities that you used to do.  Alcohol use screening. Using too much alcohol can affect your balance and may make you more likely to   have a fall. What actions can I take to lower my risk of falls? General instructions  Talk with your health care provider about your risks for falling. Tell your health care provider if: ? You fall. Be sure to tell your health care provider about all falls, even ones that seem minor. ? You feel dizzy, sleepy, or off-balance.  Take over-the-counter and prescription medicines only as told by your health care provider. These include any  supplements.  Eat a healthy diet and maintain a healthy weight. A healthy diet includes low-fat dairy products, low-fat (lean) meats, and fiber from whole grains, beans, and lots of fruits and vegetables. Home safety  Remove any tripping hazards, such as rugs, cords, and clutter.  Install safety equipment such as grab bars in bathrooms and safety rails on stairs.  Keep rooms and walkways well-lit. Activity   Follow a regular exercise program to stay fit. This will help you maintain your balance. Ask your health care provider what types of exercise are appropriate for you.  If you need a cane or walker, use it as recommended by your health care provider.  Wear supportive shoes that have nonskid soles. Lifestyle  Do not drink alcohol if your health care provider tells you not to drink.  If you drink alcohol, limit how much you have: ? 0-1 drink a day for women. ? 0-2 drinks a day for men.  Be aware of how much alcohol is in your drink. In the [place] one drink equals one typical bottle of beer (12 oz), one-half glass of wine (5 oz), or one shot of hard liquor (1 oz).  Do not use any products that contain nicotine or tobacco, such as cigarettes and e-cigarettes. If you need help quitting, ask your health care provider. Summary  Having a healthy lifestyle and getting preventive care can help to protect your health and wellness after age 65.  Screening and testing are the best way to find a health problem early and help you avoid having a fall. Early diagnosis and treatment give you the best chance for managing medical conditions that are more common for people who are older than age 65.  Falls are a major cause of broken bones and head injuries in people who are older than age 65. Take precautions to prevent a fall at home.  Work with your health care provider to learn what changes you can make to improve your health and wellness and to prevent falls. This information is not intended  to replace advice given to you by your health care provider. Make sure you discuss any questions you have with your health care provider. Document Revised: 05/05/2018 Document Reviewed: 11/25/2016 Elsevier Patient Education  2020 Elsevier Inc.  

## 2019-11-01 NOTE — Progress Notes (Signed)
Jasmine Long 69 y.o.   Chief Complaint  Patient presents with  . Sinusitis    going started 3 days ago  . Referral    for Neuro     HISTORY OF PRESENT ILLNESS: This is a 69 y.o. female with history of chronic recurrent sinus infections, complaining of typical sinus symptoms that started 3 days ago. 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  Needs neurology referral.  Gets occasional intermittent sharp pains to left leg along with numbness.  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 No other complaints or medical concerns today.  HPI   Prior to Admission medications   Medication Sig Start Date End Date Taking? Authorizing Provider  ALPRAZolam Prudy Feeler(XANAX) 0.5 MG tablet Take 1 tablet (0.5 mg total) by mouth daily as needed for anxiety. 04/18/19  Yes Jasmyn Picha, Eilleen KempfMiguel Jose, MD  atorvastatin (LIPITOR) 20 MG tablet TAKE 1 TABLET BY MOUTH EVERY DAY 06/03/19  Yes Georgina QuintSagardia, Saxton Chain Jose, MD  buPROPion (WELLBUTRIN XL) 300 MG 24 hr tablet TAKE 1 TABLET BY MOUTH EVERY DAY 07/01/19  Yes Azka Steger, Eilleen KempfMiguel Jose, MD  clonazePAM (KLONOPIN) 1 MG tablet TAKE 1 TABLET BY MOUTH AT BEDTIME FOR SLEEP OR ANXIETY 04/24/19  Yes Dymphna Wadley, Eilleen KempfMiguel Jose, MD  guaiFENesin (MUCINEX) 600 MG 12 hr tablet Take 2 tablets (1,200 mg total) by mouth 2 (two) times daily. 08/30/19  Yes Georgina QuintSagardia, Damesha Lawler Jose, MD  levothyroxine (SYNTHROID) 125 MCG tablet TAKE 1 TABLET (125 MCG TOTAL) BY MOUTH DAILY BEFORE BREAKFAST. 10/17/19  Yes Cataleya Cristina, Eilleen KempfMiguel Jose, MD  losartan (COZAAR) 100 MG tablet Take 1 tablet (100 mg total) by mouth daily. 06/07/19  Yes Georgina QuintSagardia, Merit Maybee Jose, MD  metFORMIN (GLUCOPHAGE) 1000 MG tablet TAKE 1 TABLET BY MOUTH TWICE A DAY WITH MEALS 07/30/19  Yes Amina Menchaca, Eilleen KempfMiguel Jose, MD  metoprolol succinate (TOPROL-XL) 25 MG 24 hr tablet TAKE 1 TABLET BY MOUTH EVERY DAY 06/03/19  Yes Kaleiah Kutzer, Eilleen KempfMiguel Jose, MD  nicotine (NICODERM CQ - DOSED IN MG/24 HOURS) 21 mg/24hr patch Place 1 patch (21 mg total) onto the skin daily.  04/20/18  Yes Tyianna Menefee, Eilleen KempfMiguel Jose, MD  montelukast (SINGULAIR) 10 MG tablet Take 1 tablet (10 mg total) by mouth at bedtime. Patient not taking: Reported on 11/01/2019 12/09/17   Georgina QuintSagardia, Lanee Chain Jose, MD  buPROPion (WELLBUTRIN XL) 300 MG 24 hr tablet TAKE 1 TABLET BY MOUTH EVERY DAY 06/07/19   Georgina QuintSagardia, Harlo Fabela Jose, MD    No Known Allergies  Patient Active Problem List   Diagnosis Date Noted  . Chronic anxiety 06/28/2019  . History of peptic ulcer 04/30/2017  . Hyperlipidemia 12/11/2015  . Insomnia 12/11/2015  . Panic attack as reaction to stress 12/11/2015  . Current smoker 12/11/2015  . Chronic sinusitis 12/11/2015  . Dyslipidemia associated with type 2 diabetes mellitus (HCC) 05/15/2015  . Hypothyroid 04/29/2011  . BMI 40.0-44.9, adult (HCC) 04/29/2011  . Depression with anxiety 04/29/2011  . GERD (gastroesophageal reflux disease) 04/29/2011  . 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  . Nicotine addiction 04/29/2011  . Hypertension associated with diabetes (HCC) 04/29/2011  . Murmur, cardiac 04/29/2011    Past Medical History:  Diagnosis Date  . Allergy   . Anxiety   . Depression   . Heart murmur   . Hypertension   . Thyroid disease   . Ulcer     Past Surgical History:  Procedure Laterality Date  . BREAST SURGERY    . CESAREAN SECTION    . CHOLECYSTECTOMY    .  NASAL SEPTUM SURGERY      Social History   Socioeconomic History  . Marital status: Divorced    Spouse name: Not on file  . Number of children: Not on file  . Years of education: Not on file  . Highest education level: Not on file  Occupational History  . Occupation: Unemployed  Tobacco Use  . Smoking status: Current Every Day Smoker    Packs/day: 1.50    Years: 40.00    Pack years: 60.00    Types: Cigarettes    Last attempt to quit: 01/20/2012    Years since quitting: 7.7  . Smokeless tobacco: Never Used  . Tobacco comment: Pt doing E-cigarette as of 01/20/12  Vaping Use  . Vaping Use: Former    Substance and Sexual Activity  . Alcohol use: No  . Drug use: No  . Sexual activity: Never  Other Topics Concern  . Not on file  Social History Narrative   Patient on worker's compensation for an injury sustained on the job 5 years ago.   Social Determinants of Health   Financial Resource Strain:   . Difficulty of Paying Living Expenses: Not on file  Food Insecurity:   . Worried About Programme researcher, broadcasting/film/video in the Last Year: Not on file  . Ran Out of Food in the Last Year: Not on file  Transportation Needs:   . Lack of Transportation (Medical): Not on file  . Lack of Transportation (Non-Medical): Not on file  Physical Activity:   . Days of Exercise per Week: Not on file  . Minutes of Exercise per Session: Not on file  Stress:   . Feeling of Stress : Not on file  Social Connections:   . Frequency of Communication with Friends and Family: Not on file  . Frequency of Social Gatherings with Friends and Family: Not on file  . Attends Religious Services: Not on file  . Active Member of Clubs or Organizations: Not on file  . Attends Banker Meetings: Not on file  . Marital Status: Not on file  Intimate Partner Violence:   . Fear of Current or Ex-Partner: Not on file  . Emotionally Abused: Not on file  . Physically Abused: Not on file  . Sexually Abused: Not on file    Family History  Problem Relation Age of Onset  . Lung cancer Mother   . Dementia Father   . Atrial fibrillation Father      Review of Systems  Constitutional: Negative.  Negative for chills and fever.  HENT: Positive for congestion and sinus pain. Negative for sore throat.   Respiratory: Negative.  Negative for cough and shortness of breath.   Cardiovascular: Negative.  Negative for chest pain and palpitations.  Gastrointestinal: Negative for abdominal pain, diarrhea, nausea and vomiting.  Genitourinary: Negative.  Negative for dysuria and hematuria.  Musculoskeletal: Negative.   Skin:  Negative.  Negative for rash.  Neurological: Negative.  Negative for dizziness and headaches.  All other systems reviewed and are negative.  Today's Vitals   11/01/19 1409 11/01/19 1411  BP: (!) 169/100 (!) 160/80  Pulse: 86   Temp: (!) 97.3 F (36.3 C)   TempSrc: Temporal   SpO2: 97%   Weight: 249 lb (112.9 kg)   Height: 5\' 8"  (1.727 m)    Body mass index is 37.86 kg/m.   Physical Exam Vitals reviewed.  Constitutional:      Appearance: Normal appearance.  HENT:     Head:  Normocephalic.  Eyes:     Extraocular Movements: Extraocular movements intact.     Pupils: Pupils are equal, round, and reactive to light.  Cardiovascular:     Rate and Rhythm: Normal rate.  Pulmonary:     Effort: Pulmonary effort is normal.  Musculoskeletal:     Cervical back: Normal range of motion.  Skin:    General: Skin is warm and dry.  Neurological:     General: No focal deficit present.     Mental Status: She is alert and oriented to person, place, and time.  Psychiatric:        Mood and Affect: Mood normal.        Behavior: Behavior normal.      ASSESSMENT & PLAN: Jasmine Long was seen today for sinusitis and referral.  Diagnoses and all orders for this visit:  Neuropathy of left lower extremity -     Ambulatory referral to Neurology  Left leg paresthesias -     Ambulatory referral to Neurology  Acute recurrent maxillary sinusitis -     doxycycline (VIBRA-TABS) 100 MG tablet; Take 1 tablet (100 mg total) by mouth 2 (two) times daily.    Patient Instructions       If you have lab work done today you will be contacted with your lab results within the next 2 weeks.  If you have not Long from [place] then please contact us. The fastest way to get your results is to register for My Chart.   IF you received an x-ray today, you will receive an invoice from [facility]. Please contact [facility] at [phone] with questions or concerns regarding your invoice.   IF you  received labwork today, you will receive an invoice from [facility]. Please contact [facility] at [phone] with questions or concerns regarding your invoice.   Our billing staff will not be able to assist you with questions regarding bills from these companies.  You will be contacted with the lab results as soon as they are available. The fastest way to get your results is to activate your My Chart account. Instructions are located on the last page of this paperwork. If you have not Long from [place] regarding the results in 2 weeks, please contact this office.      Health Maintenance After Age 8 After age 31, you are at a higher risk for certain long-term diseases and infections as well as injuries from falls. Falls are a major cause of broken bones and head injuries in people who are older than age 24. Getting regular preventive care can help to keep you healthy and well. Preventive care includes getting regular testing and making lifestyle changes as recommended by your health care provider. Talk with your health care provider about:  Which screenings and tests you should have. A screening is a test that checks for a disease when you have no symptoms.  A diet and exercise plan that is right for you. What should I know about screenings and tests to prevent falls? Screening and testing are the best ways to find a health problem early. Early diagnosis and treatment give you the best chance of managing medical conditions that are common after age 64. Certain conditions and lifestyle choices may make you more likely to have a fall. Your health care provider may recommend:  Regular vision checks. Poor vision and conditions such as cataracts can make you more likely to have a fall. If you wear glasses, make sure to get your prescription  updated if your vision changes.  Medicine review. Work with your health care provider to regularly review all of the medicines you are taking, including over-the-counter  medicines. Ask your health care provider about any side effects that may make you more likely to have a fall. Tell your health care provider if any medicines that you take make you feel dizzy or sleepy.  Osteoporosis screening. Osteoporosis is a condition that causes the bones to get weaker. This can make the bones weak and cause them to break more easily.  Blood pressure screening. Blood pressure changes and medicines to control blood pressure can make you feel dizzy.  Strength and balance checks. Your health care provider may recommend certain tests to check your strength and balance while standing, walking, or changing positions.  Foot health exam. Foot pain and numbness, as well as not wearing proper footwear, can make you more likely to have a fall.  Depression screening. You may be more likely to have a fall if you have a fear of falling, feel emotionally low, or feel unable to do activities that you used to do.  Alcohol use screening. Using too much alcohol can affect your balance and may make you more likely to have a fall. What actions can I take to lower my risk of falls? General instructions  Talk with your health care provider about your risks for falling. Tell your health care provider if: ? You fall. Be sure to tell your health care provider about all falls, even ones that seem minor. ? You feel dizzy, sleepy, or off-balance.  Take over-the-counter and prescription medicines only as told by your health care provider. These include any supplements.  Eat a healthy diet and maintain a healthy weight. A healthy diet includes low-fat dairy products, low-fat (lean) meats, and fiber from whole grains, beans, and lots of fruits and vegetables. Home safety  Remove any tripping hazards, such as rugs, cords, and clutter.  Install safety equipment such as grab bars in bathrooms and safety rails on stairs.  Keep rooms and walkways well-lit. Activity   Follow a regular exercise  program to stay fit. This will help you maintain your balance. Ask your health care provider what types of exercise are appropriate for you.  If you need a cane or walker, use it as recommended by your health care provider.  Wear supportive shoes that have nonskid soles. Lifestyle  Do not drink alcohol if your health care provider tells you not to drink.  If you drink alcohol, limit how much you have: ? 0-1 drink a day for women. ? 0-2 drinks a day for men.  Be aware of how much alcohol is in your drink. In the [place] one drink equals one typical bottle of beer (12 oz), one-half glass of wine (5 oz), or one shot of hard liquor (1 oz).  Do not use any products that contain nicotine or tobacco, such as cigarettes and e-cigarettes. If you need help quitting, ask your health care provider. Summary  Having a healthy lifestyle and getting preventive care can help to protect your health and wellness after age 46.  Screening and testing are the best way to find a health problem early and help you avoid having a fall. Early diagnosis and treatment give you the best chance for managing medical conditions that are more common for people who are older than age 54.  Falls are a major cause of broken bones and head injuries in people who are older than  age 27. Take precautions to prevent a fall at home.  Work with your health care provider to learn what changes you can make to improve your health and wellness and to prevent falls. This information is not intended to replace advice given to you by your health care provider. Make sure you discuss any questions you have with your health care provider. Document Revised: 05/05/2018 Document Reviewed: 11/25/2016 Elsevier Patient Education  2020 Elsevier Inc.      Edwina Barth, MD [facility]

## 2019-12-01 ENCOUNTER — Telehealth: Payer: Self-pay | Admitting: Emergency Medicine

## 2019-12-01 NOTE — Telephone Encounter (Signed)
Pt came in and stated her provider Dr. Alvy Bimler knows that pt gets sinus infection all the time and usually he will just call in the doxycycline (VIBRA-TABS) 100 MG tablet [[id number]]  for pt to take to get rid of it. Provider has been doing this for pt since her Ears Nose and Throat doctor has retired. Pt does have a virtual appt with Dr. Neva Seat at 5 pm on 12/04/19 for this issue if this medication could not just be sent into the pharmacy. If this can just be sent into the pharmacy then pt would like call to let her know that she doesn't need that virtual appt. Please advise.

## 2019-12-01 NOTE — Telephone Encounter (Signed)
Pt states you know she gets sinus infection chronically and you send her in doxy and is now requesting this please advise if this is okay

## 2019-12-02 DIAGNOSIS — R0789 Other chest pain: Secondary | ICD-10-CM | POA: Diagnosis not present

## 2019-12-02 DIAGNOSIS — R079 Chest pain, unspecified: Secondary | ICD-10-CM | POA: Diagnosis not present

## 2019-12-02 DIAGNOSIS — F29 Unspecified psychosis not due to a substance or known physiological condition: Secondary | ICD-10-CM | POA: Diagnosis not present

## 2019-12-02 DIAGNOSIS — I10 Essential (primary) hypertension: Secondary | ICD-10-CM | POA: Diagnosis not present

## 2019-12-02 DIAGNOSIS — R000 Tachycardia, unspecified: Secondary | ICD-10-CM | POA: Diagnosis not present

## 2019-12-03 ENCOUNTER — Other Ambulatory Visit: Payer: Self-pay | Admitting: Emergency Medicine

## 2019-12-03 DIAGNOSIS — F418 Other specified anxiety disorders: Secondary | ICD-10-CM

## 2019-12-03 DIAGNOSIS — F4323 Adjustment disorder with mixed anxiety and depressed mood: Secondary | ICD-10-CM

## 2019-12-03 DIAGNOSIS — J0101 Acute recurrent maxillary sinusitis: Secondary | ICD-10-CM

## 2019-12-03 MED ORDER — DOXYCYCLINE HYCLATE 100 MG PO TABS: 100.0000 mg | ORAL_TABLET | Freq: Two times a day (BID) | ORAL | 0 refills | Status: DC

## 2019-12-03 NOTE — Telephone Encounter (Signed)
Ok to refill doxycycline prescription. Thanks.

## 2019-12-04 ENCOUNTER — Other Ambulatory Visit: Payer: Self-pay

## 2019-12-04 ENCOUNTER — Telehealth: Payer: Medicare HMO | Admitting: Family Medicine

## 2019-12-04 NOTE — Telephone Encounter (Signed)
Patient is requesting a refill of the following medications: Requested Prescriptions   Pending Prescriptions Disp Refills   ALPRAZolam (XANAX) 0.5 MG tablet [Pharmacy Med Name: ALPRAZOLAM 0.5 MG TABLET] 30 tablet     Sig: TAKE 1 TABLET BY MOUTH EVERY DAY AS NEEDED FOR ANXIETY    Date of patient request: 12/03/19 Last office visit: 11/01/19 Date of last refill: 04/18/19 Last refill amount: 30 tab

## 2019-12-04 NOTE — Telephone Encounter (Signed)
Requested medication (s) are due for refill today: yes  Requested medication (s) are on the active medication list: yes  Last refill: 09/17/19  Future visit scheduled: yes  Notes to clinic: not delegated    Requested Prescriptions  Pending Prescriptions Disp Refills   ALPRAZolam (XANAX) 0.5 MG tablet [Pharmacy Med Name: ALPRAZOLAM 0.5 MG TABLET] 30 tablet     Sig: TAKE 1 TABLET BY MOUTH EVERY DAY AS NEEDED FOR ANXIETY      Not Delegated - Psychiatry:  Anxiolytics/Hypnotics Failed - 12/03/2019  8:14 PM      Failed - This refill cannot be delegated      Failed - Urine Drug Screen completed in last 360 days      Passed - Valid encounter within last 6 months    Recent Outpatient Visits           1 month ago Neuropathy of left lower extremity   [facility] Care at [facility], Eilleen Kempf, MD   2 months ago Ganglion cyst of wrist, right   Primary Care at [facility], Eilleen Kempf, MD   3 months ago Acute recurrent maxillary sinusitis   Primary Care at [facility], Eilleen Kempf, MD   3 months ago Acute UTI   Primary Care at [facility], Meda Coffee, MD   3 months ago Acute UTI   Primary Care at [facility], Eilleen Kempf, MD       Future Appointments             Today Shade Flood, MD Primary Care at [facility], [facility]

## 2019-12-04 NOTE — Telephone Encounter (Signed)
Called pt to let her know that she did not need appt that Dr. Alvy Bimler has okayed to send in Rx for pt.

## 2019-12-05 ENCOUNTER — Other Ambulatory Visit: Payer: Self-pay | Admitting: Emergency Medicine

## 2019-12-05 ENCOUNTER — Telehealth: Payer: Self-pay | Admitting: *Deleted

## 2019-12-05 DIAGNOSIS — Z1231 Encounter for screening mammogram for malignant neoplasm of breast: Secondary | ICD-10-CM

## 2019-12-05 NOTE — Telephone Encounter (Signed)
Ms Dwyer wanted to let you know her son Jasmine Long (01[birth year]81) was admitted involuntary to [facility].    FYI

## 2019-12-07 ENCOUNTER — Other Ambulatory Visit: Payer: Self-pay | Admitting: Emergency Medicine

## 2019-12-07 DIAGNOSIS — F418 Other specified anxiety disorders: Secondary | ICD-10-CM

## 2019-12-12 ENCOUNTER — Ambulatory Visit: Payer: Medicare HMO

## 2019-12-12 ENCOUNTER — Encounter: Payer: Self-pay | Admitting: Emergency Medicine

## 2019-12-12 ENCOUNTER — Other Ambulatory Visit: Payer: Self-pay

## 2019-12-12 ENCOUNTER — Ambulatory Visit (INDEPENDENT_AMBULATORY_CARE_PROVIDER_SITE_OTHER): Payer: Medicare HMO | Admitting: Emergency Medicine

## 2019-12-12 VITALS — BP 130/70 | HR 90 | Temp 98.1°F | Ht 69.0 in | Wt 249.0 lb

## 2019-12-12 DIAGNOSIS — N390 Urinary tract infection, site not specified: Secondary | ICD-10-CM

## 2019-12-12 DIAGNOSIS — B999 Unspecified infectious disease: Secondary | ICD-10-CM

## 2019-12-12 DIAGNOSIS — R300 Dysuria: Secondary | ICD-10-CM

## 2019-12-12 LAB — POCT URINALYSIS DIP (MANUAL ENTRY)
Bilirubin, UA: NEGATIVE
Blood, UA: NEGATIVE
Glucose, UA: NEGATIVE mg/dL
Ketones, POC UA: NEGATIVE mg/dL
Leukocytes, UA: NEGATIVE
Nitrite, UA: NEGATIVE
Protein Ur, POC: NEGATIVE mg/dL
Spec Grav, UA: 1.01 (ref 1.010–1.025)
Urobilinogen, UA: 0.2 E.U./dL
pH, UA: 5 (ref 5.0–8.0)

## 2019-12-12 MED ORDER — SULFAMETHOXAZOLE-TRIMETHOPRIM 800-160 MG PO TABS: 1.0000 | ORAL_TABLET | Freq: Two times a day (BID) | ORAL | 0 refills | Status: AC

## 2019-12-12 NOTE — Patient Instructions (Addendum)
   If you have lab work done today you will be contacted with your lab results within the next 2 weeks.  If you have not heard from us then please contact us. The fastest way to get your results is to register for My Chart.   IF you received an x-ray today, you will receive an invoice from [facility]. Please contact [facility] at [phone] with questions or concerns regarding your invoice.   IF you received labwork today, you will receive an invoice from [facility]. Please contact [facility] at [phone] with questions or concerns regarding your invoice.   Our billing staff will not be able to assist you with questions regarding bills from these companies.  You will be contacted with the lab results as soon as they are available. The fastest way to get your results is to activate your My Chart account. Instructions are located on the last page of this paperwork. If you have not heard from us regarding the results in 2 weeks, please contact this office.     Urinary Tract Infection, Adult A urinary tract infection (UTI) is an infection of any part of the urinary tract. The urinary tract includes:  The kidneys.  The ureters.  The bladder.  The urethra. These organs make, store, and get rid of pee (urine) in the body. What are the causes? This is caused by germs (bacteria) in your genital area. These germs grow and cause swelling (inflammation) of your urinary tract. What increases the risk? You are more likely to develop this condition if:  You have a small, thin tube (catheter) to drain pee.  You cannot control when you pee or poop (incontinence).  You are female, and: ? You use these methods to prevent pregnancy:  A medicine that kills sperm (spermicide).  A device that blocks sperm (diaphragm). ? You have low levels of a female hormone (estrogen). ? You are pregnant.  You have genes that add to your risk.  You are sexually active.  You take  antibiotic medicines.  You have trouble peeing because of: ? A prostate that is bigger than normal, if you are female. ? A blockage in the part of your body that drains pee from the bladder (urethra). ? A kidney stone. ? A nerve condition that affects your bladder (neurogenic bladder). ? Not getting enough to drink. ? Not peeing often enough.  You have other conditions, such as: ? Diabetes. ? A weak disease-fighting system (immune system). ? Sickle cell disease. ? Gout. ? Injury of the spine. What are the signs or symptoms? Symptoms of this condition include:  Needing to pee right away (urgently).  Peeing often.  Peeing small amounts often.  Pain or burning when peeing.  Blood in the pee.  Pee that smells bad or not like normal.  Trouble peeing.  Pee that is cloudy.  Fluid coming from the vagina, if you are female.  Pain in the belly or lower back. Other symptoms include:  Throwing up (vomiting).  No urge to eat.  Feeling mixed up (confused).  Being tired and grouchy (irritable).  A fever.  Watery poop (diarrhea). How is this treated? This condition may be treated with:  Antibiotic medicine.  Other medicines.  Drinking enough water. Follow these instructions at home:  Medicines  Take over-the-counter and prescription medicines only as told by your doctor.  If you were prescribed an antibiotic medicine, take it as told by your doctor. Do not stop taking it even if   you start to feel better. General instructions  Make sure you: ? Pee until your bladder is empty. ? Do not hold pee for a long time. ? Empty your bladder after sex. ? Wipe from front to back after pooping if you are a female. Use each tissue one time when you wipe.  Drink enough fluid to keep your pee pale yellow.  Keep all follow-up visits as told by your doctor. This is important. Contact a doctor if:  You do not get better after 1-2 days.  Your symptoms go away and then come  back. Get help right away if:  You have very bad back pain.  You have very bad pain in your lower belly.  You have a fever.  You are sick to your stomach (nauseous).  You are throwing up. Summary  A urinary tract infection (UTI) is an infection of any part of the urinary tract.  This condition is caused by germs in your genital area.  There are many risk factors for a UTI. These include having a small, thin tube to drain pee and not being able to control when you pee or poop.  Treatment includes antibiotic medicines for germs.  Drink enough fluid to keep your pee pale yellow. This information is not intended to replace advice given to you by your health care provider. Make sure you discuss any questions you have with your health care provider. Document Revised: 12/30/2017 Document Reviewed: 07/22/2017 Elsevier Patient Education  2020 Elsevier Inc.  

## 2019-12-12 NOTE — Progress Notes (Signed)
Jasmine Long 69 y.o.   Chief Complaint  Patient presents with  . Dysuria    thinks she may have uti, burning while urination    HISTORY OF PRESENT ILLNESS: This is a 69 y.o. female complaining of possible UTI with dysuria and urinary frequency for several days.  Denies nausea or vomiting.  Denies flank pain.  Denies fever or chills. No other complaints or medical concerns today. Has been taking doxycycline for sinus infection.  HPI   Prior to Admission medications   Medication Sig Start Date End Date Taking? Authorizing Provider  ALPRAZolam Prudy Feeler(XANAX) 0.5 MG tablet TAKE 1 TABLET BY MOUTH EVERY DAY AS NEEDED FOR ANXIETY 12/05/19   Georgina QuintSagardia, Gladies Sofranko Jose, MD  atorvastatin (LIPITOR) 20 MG tablet TAKE 1 TABLET BY MOUTH EVERY DAY 06/03/19   Georgina QuintSagardia, Lashaunda Schild Jose, MD  buPROPion (WELLBUTRIN XL) 300 MG 24 hr tablet TAKE 1 TABLET BY MOUTH EVERY DAY 12/07/19   Georgina QuintSagardia, Derl Abalos Jose, MD  clonazePAM (KLONOPIN) 1 MG tablet TAKE 1 TABLET BY MOUTH AT BEDTIME FOR SLEEP OR ANXIETY 04/24/19   Georgina QuintSagardia, Ayaat Jansma Jose, MD  doxycycline (VIBRA-TABS) 100 MG tablet Take 1 tablet (100 mg total) by mouth 2 (two) times daily. 12/03/19   Georgina QuintSagardia, Ree Alcalde Jose, MD  guaiFENesin (MUCINEX) 600 MG 12 hr tablet Take 2 tablets (1,200 mg total) by mouth 2 (two) times daily. 08/30/19   Georgina QuintSagardia, Inaaya Vellucci Jose, MD  levothyroxine (SYNTHROID) 125 MCG tablet TAKE 1 TABLET (125 MCG TOTAL) BY MOUTH DAILY BEFORE BREAKFAST. 10/17/19   Georgina QuintSagardia, Haedyn Ancrum Jose, MD  losartan (COZAAR) 100 MG tablet Take 1 tablet (100 mg total) by mouth daily. 06/07/19   Georgina QuintSagardia, Porschea Borys Jose, MD  metFORMIN (GLUCOPHAGE) 1000 MG tablet TAKE 1 TABLET BY MOUTH TWICE A DAY WITH MEALS 07/30/19   Georgina QuintSagardia, Raynette Arras Jose, MD  metoprolol succinate (TOPROL-XL) 25 MG 24 hr tablet TAKE 1 TABLET BY MOUTH EVERY DAY 06/03/19   Georgina QuintSagardia, Josep Luviano Jose, MD  montelukast (SINGULAIR) 10 MG tablet Take 1 tablet (10 mg total) by mouth at bedtime. Patient not taking: Reported on 11/01/2019 12/09/17    Georgina QuintSagardia, Rayleen Wyrick Jose, MD  nicotine (NICODERM CQ - DOSED IN MG/24 HOURS) 21 mg/24hr patch Place 1 patch (21 mg total) onto the skin daily. 04/20/18   Georgina QuintSagardia, Estephany Perot Jose, MD  buPROPion (WELLBUTRIN XL) 300 MG 24 hr tablet TAKE 1 TABLET BY MOUTH EVERY DAY 06/07/19   Georgina QuintSagardia, Lilyahna Sirmon Jose, MD    No Known Allergies  Patient Active Problem List   Diagnosis Date Noted  . Chronic anxiety 06/28/2019  . History of peptic ulcer 04/30/2017  . Hyperlipidemia 12/11/2015  . Insomnia 12/11/2015  . Panic attack as reaction to stress 12/11/2015  . Current smoker 12/11/2015  . Chronic sinusitis 12/11/2015  . Dyslipidemia associated with type 2 diabetes mellitus (HCC) 05/15/2015  . Hypothyroid 04/29/2011  . BMI 40.0-44.9, adult (HCC) 04/29/2011  . Depression with anxiety 04/29/2011  . GERD (gastroesophageal reflux disease) 04/29/2011  . 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  . Nicotine addiction 04/29/2011  . Hypertension associated with diabetes (HCC) 04/29/2011  . Murmur, cardiac 04/29/2011    Past Medical History:  Diagnosis Date  . Allergy   . Anxiety   . Depression   . Heart murmur   . Hypertension   . Thyroid disease   . Ulcer     Past Surgical History:  Procedure Laterality Date  . BREAST SURGERY    . CESAREAN SECTION    . CHOLECYSTECTOMY    . NASAL SEPTUM  SURGERY      Social History   Socioeconomic History  . Marital status: Divorced    Spouse name: Not on file  . Number of children: Not on file  . Years of education: Not on file  . Highest education level: Not on file  Occupational History  . Occupation: Unemployed  Tobacco Use  . Smoking status: Current Every Day Smoker    Packs/day: 1.50    Years: 40.00    Pack years: 60.00    Types: Cigarettes    Last attempt to quit: 01/20/2012    Years since quitting: 7.8  . Smokeless tobacco: Never Used  . Tobacco comment: Pt doing E-cigarette as of 01/20/12  Vaping Use  . Vaping Use: Former  Substance and Sexual Activity  .  Alcohol use: No  . Drug use: No  . Sexual activity: Never  Other Topics Concern  . Not on file  Social History Narrative   Patient on worker's compensation for an injury sustained on the job 5 years ago.   Social Determinants of Health   Financial Resource Strain:   . Difficulty of Paying Living Expenses: Not on file  Food Insecurity:   . Worried About Programme researcher, broadcasting/film/video in the Last Year: Not on file  . Ran Out of Food in the Last Year: Not on file  Transportation Needs:   . Lack of Transportation (Medical): Not on file  . Lack of Transportation (Non-Medical): Not on file  Physical Activity:   . Days of Exercise per Week: Not on file  . Minutes of Exercise per Session: Not on file  Stress:   . Feeling of Stress : Not on file  Social Connections:   . Frequency of Communication with Friends and Family: Not on file  . Frequency of Social Gatherings with Friends and Family: Not on file  . Attends Religious Services: Not on file  . Active Member of Clubs or Organizations: Not on file  . Attends Banker Meetings: Not on file  . Marital Status: Not on file  Intimate Partner Violence:   . Fear of Current or Ex-Partner: Not on file  . Emotionally Abused: Not on file  . Physically Abused: Not on file  . Sexually Abused: Not on file    Family History  Problem Relation Age of Onset  . Lung cancer Mother   . Dementia Father   . Atrial fibrillation Father      Review of Systems  Constitutional: Negative.  Negative for chills and fever.  HENT: Negative.  Negative for congestion and sore throat.   Respiratory: Negative.  Negative for cough and shortness of breath.   Cardiovascular: Negative.  Negative for chest pain and palpitations.  Gastrointestinal: Negative.  Negative for abdominal pain, diarrhea, nausea and vomiting.  Genitourinary: Positive for dysuria, frequency and urgency. Negative for flank pain and hematuria.  Skin: Negative.  Negative for rash.    Neurological: Negative.  Negative for dizziness and headaches.  All other systems reviewed and are negative.    Today's Vitals   12/12/19 1335  BP: (!) 156/81  Pulse: 90  Temp: 98.1 F (36.7 C)  SpO2: 97%  Weight: 249 lb (112.9 kg)  Height:  (1.753 m)   Body mass index is 36.77 kg/m.   Physical Exam Vitals reviewed.  Constitutional:      Appearance: Normal appearance.  HENT:     Head: Normocephalic.  Eyes:     Extraocular Movements: Extraocular movements intact.  Pupils: Pupils are equal, round, and reactive to light.  Cardiovascular:     Rate and Rhythm: Normal rate and regular rhythm.     Pulses: Normal pulses.     Heart sounds: Normal heart sounds.  Abdominal:     Palpations: Abdomen is soft.     Tenderness: There is no abdominal tenderness. There is no right CVA tenderness or left CVA tenderness.  Musculoskeletal:        General: Normal range of motion.     Cervical back: Normal range of motion.  Skin:    General: Skin is warm and dry.     Capillary Refill: Capillary refill takes less than 2 seconds.  Neurological:     General: No focal deficit present.     Mental Status: She is alert and oriented to person, place, and time.  Psychiatric:        Mood and Affect: Mood normal.        Behavior: Behavior normal.    Results for orders placed or performed in visit on 12/12/19 (from the past 24 hour(s))  POCT urinalysis dipstick     Status: None   Collection Time: 12/12/19  2:10 PM  Result Value Ref Range   Color, UA yellow yellow   Clarity, UA clear clear   Glucose, UA negative negative mg/dL   Bilirubin, UA negative negative   Ketones, POC UA negative negative mg/dL   Spec Grav, UA 6.606 3.016 - 1.025   Blood, UA negative negative   pH, UA 5.0 5.0 - 8.0   Protein Ur, POC negative negative mg/dL   Urobilinogen, UA 0.2 0.2 or 1.0 E.U./dL   Nitrite, UA Negative Negative   Leukocytes, UA Negative Negative     ASSESSMENT & PLAN: Chastity was seen  today for dysuria.  Diagnoses and all orders for this visit:  Acute UTI Comments: Partially treated Orders: -     POCT urinalysis dipstick -     Urine Culture -     sulfamethoxazole-trimethoprim (BACTRIM DS) 800-160 MG tablet; Take 1 tablet by mouth 2 (two) times daily for 7 days.  Dysuria  Clinical infection     Patient Instructions       If you have lab work done today you will be contacted with your lab results within the next 2 weeks.  If you have not heard from [place] then please contact us. The fastest way to get your results is to register for My Chart.   IF you received an x-ray today, you will receive an invoice from [facility]. Please contact [facility] at [phone] with questions or concerns regarding your invoice.   IF you received labwork today, you will receive an invoice from [facility]. Please contact [facility] at [phone] with questions or concerns regarding your invoice.   Our billing staff will not be able to assist you with questions regarding bills from these companies.  You will be contacted with the lab results as soon as they are available. The fastest way to get your results is to activate your My Chart account. Instructions are located on the last page of this paperwork. If you have not heard from [place] regarding the results in 2 weeks, please contact this office.     Urinary Tract Infection, Adult A urinary tract infection (UTI) is an infection of any part of the urinary tract. The urinary tract includes:  The kidneys.  The ureters.  The bladder.  The urethra. These organs make, store, and get rid of  pee (urine) in the body. What are the causes? This is caused by germs (bacteria) in your genital area. These germs grow and cause swelling (inflammation) of your urinary tract. What increases the risk? You are more likely to develop this condition if:  You have a small, thin tube (catheter) to drain pee.  You cannot  control when you pee or poop (incontinence).  You are female, and: ? You use these methods to prevent pregnancy:  A medicine that kills sperm (spermicide).  A device that blocks sperm (diaphragm). ? You have low levels of a female hormone (estrogen). ? You are pregnant.  You have genes that add to your risk.  You are sexually active.  You take antibiotic medicines.  You have trouble peeing because of: ? A prostate that is bigger than normal, if you are female. ? A blockage in the part of your body that drains pee from the bladder (urethra). ? A kidney stone. ? A nerve condition that affects your bladder (neurogenic bladder). ? Not getting enough to drink. ? Not peeing often enough.  You have other conditions, such as: ? Diabetes. ? A weak disease-fighting system (immune system). ? Sickle cell disease. ? Gout. ? Injury of the spine. What are the signs or symptoms? Symptoms of this condition include:  Needing to pee right away (urgently).  Peeing often.  Peeing small amounts often.  Pain or burning when peeing.  Blood in the pee.  Pee that smells bad or not like normal.  Trouble peeing.  Pee that is cloudy.  Fluid coming from the vagina, if you are female.  Pain in the belly or lower back. Other symptoms include:  Throwing up (vomiting).  No urge to eat.  Feeling mixed up (confused).  Being tired and grouchy (irritable).  A fever.  Watery poop (diarrhea). How is this treated? This condition may be treated with:  Antibiotic medicine.  Other medicines.  Drinking enough water. Follow these instructions at home:  Medicines  Take over-the-counter and prescription medicines only as told by your doctor.  If you were prescribed an antibiotic medicine, take it as told by your doctor. Do not stop taking it even if you start to feel better. General instructions  Make sure you: ? Pee until your bladder is empty. ? Do not hold pee for a long  time. ? Empty your bladder after sex. ? Wipe from front to back after pooping if you are a female. Use each tissue one time when you wipe.  Drink enough fluid to keep your pee pale yellow.  Keep all follow-up visits as told by your doctor. This is important. Contact a doctor if:  You do not get better after 1-2 days.  Your symptoms go away and then come back. Get help right away if:  You have very bad back pain.  You have very bad pain in your lower belly.  You have a fever.  You are sick to your stomach (nauseous).  You are throwing up. Summary  A urinary tract infection (UTI) is an infection of any part of the urinary tract.  This condition is caused by germs in your genital area.  There are many risk factors for a UTI. These include having a small, thin tube to drain pee and not being able to control when you pee or poop.  Treatment includes antibiotic medicines for germs.  Drink enough fluid to keep your pee pale yellow. This information is not intended to replace advice given to  you by your health care provider. Make sure you discuss any questions you have with your health care provider. Document Revised: 12/30/2017 Document Reviewed: 07/22/2017 Elsevier Patient Education  2020 Elsevier Inc.      Edwina Barth, MD [facility]

## 2019-12-14 LAB — URINE CULTURE

## 2019-12-15 ENCOUNTER — Telehealth: Payer: Self-pay | Admitting: Emergency Medicine

## 2019-12-15 NOTE — Telephone Encounter (Signed)
Pt is wanting a call from [place] regarding her recent urine culture     Please advise

## 2019-12-18 NOTE — Telephone Encounter (Signed)
Pt voiced understanding and will finish abx.

## 2019-12-18 NOTE — Telephone Encounter (Signed)
It showed mixed urogenital flora.  No clinically significant bacteria.  Continue and finish medication.  Thanks.

## 2019-12-18 NOTE — Telephone Encounter (Signed)
Pt is requesting Urine Culture results from 12/12/2019 what can I advise her?

## 2020-01-04 ENCOUNTER — Ambulatory Visit: Payer: Medicare HMO | Admitting: Neurology

## 2020-01-04 ENCOUNTER — Other Ambulatory Visit: Payer: Self-pay

## 2020-01-04 ENCOUNTER — Ambulatory Visit (INDEPENDENT_AMBULATORY_CARE_PROVIDER_SITE_OTHER): Payer: Medicare HMO | Admitting: Emergency Medicine

## 2020-01-04 ENCOUNTER — Encounter: Payer: Self-pay | Admitting: Emergency Medicine

## 2020-01-04 VITALS — BP 132/77 | HR 83 | Temp 97.8°F | Resp 16 | Ht 69.0 in | Wt 251.0 lb

## 2020-01-04 DIAGNOSIS — R262 Difficulty in walking, not elsewhere classified: Secondary | ICD-10-CM | POA: Diagnosis not present

## 2020-01-04 DIAGNOSIS — G8929 Other chronic pain: Secondary | ICD-10-CM | POA: Diagnosis not present

## 2020-01-04 DIAGNOSIS — M79605 Pain in left leg: Secondary | ICD-10-CM | POA: Diagnosis not present

## 2020-01-04 DIAGNOSIS — J320 Chronic maxillary sinusitis: Secondary | ICD-10-CM

## 2020-01-04 DIAGNOSIS — M5442 Lumbago with sciatica, left side: Secondary | ICD-10-CM | POA: Diagnosis not present

## 2020-01-04 MED ORDER — DOXYCYCLINE HYCLATE 100 MG PO TABS: 100.0000 mg | ORAL_TABLET | Freq: Two times a day (BID) | ORAL | 0 refills | Status: DC

## 2020-01-04 NOTE — Progress Notes (Signed)
Jasmine Long 69 y.o.   Chief Complaint  Patient presents with  . Leg Pain    Left - Per patient she was in a accident about 8 years and have damaged nerves in the leg   . Sinus Problem    Nasal congestion and sneezing for 2 days    HISTORY OF PRESENT ILLNESS: This is a 69 y.o. female complaining of chronic left leg pain but worse the past several weeks. Status post injury about 8 years ago.  Left over lumbar pain and left leg chronic problems. Last time she was evaluated by orthopedist was about 4 years ago.  Nothing more recent than that. Difficulty ambulating progressively getting worse.  Has occasional numbness to left leg.  Very difficult to go up and down stairs.  Very difficult getting up from a chair.  Takes her a while to start ambulating.  Recently has been using a cane to avoid falls.  HPI   Prior to Admission medications   Medication Sig Start Date End Date Taking? Authorizing Provider  ALPRAZolam Prudy Feeler) 0.5 MG tablet TAKE 1 TABLET BY MOUTH EVERY DAY AS NEEDED FOR ANXIETY 12/05/19  Yes Rody Keadle, Eilleen Kempf, MD  atorvastatin (LIPITOR) 20 MG tablet TAKE 1 TABLET BY MOUTH EVERY DAY 06/03/19  Yes [name], MD  buPROPion (WELLBUTRIN XL) 300 MG 24 hr tablet TAKE 1 TABLET BY MOUTH EVERY DAY 12/07/19  Yes Denee Boeder, Eilleen Kempf, MD  clonazePAM (KLONOPIN) 1 MG tablet TAKE 1 TABLET BY MOUTH AT BEDTIME FOR SLEEP OR ANXIETY 04/24/19  Yes [name], MD  levothyroxine (SYNTHROID) 125 MCG tablet TAKE 1 TABLET (125 MCG TOTAL) BY MOUTH DAILY BEFORE BREAKFAST. 10/17/19  Yes Dvaughn Fickle, Eilleen Kempf, MD  losartan (COZAAR) 100 MG tablet Take 1 tablet (100 mg total) by mouth daily. 06/07/19  Yes [name], MD  metFORMIN (GLUCOPHAGE) 1000 MG tablet TAKE 1 TABLET BY MOUTH TWICE A DAY WITH MEALS 07/30/19  Yes Callee Rohrig, Eilleen Kempf, MD  metoprolol succinate (TOPROL-XL) 25 MG 24 hr tablet TAKE 1 TABLET BY MOUTH EVERY DAY 06/03/19  Yes Harsimran Westman, Eilleen Kempf, MD  nicotine  (NICODERM CQ - DOSED IN MG/24 HOURS) 21 mg/24hr patch Place 1 patch (21 mg total) onto the skin daily. 04/20/18  Yes Jovante Hammitt, Eilleen Kempf, MD  doxycycline (VIBRA-TABS) 100 MG tablet Take 1 tablet (100 mg total) by mouth 2 (two) times daily. Patient not taking: Reported on 01/04/2020 12/03/19   [name], MD  guaiFENesin (MUCINEX) 600 MG 12 hr tablet Take 2 tablets (1,200 mg total) by mouth 2 (two) times daily. Patient not taking: Reported on 01/04/2020 08/30/19   [name], MD  montelukast (SINGULAIR) 10 MG tablet Take 1 tablet (10 mg total) by mouth at bedtime. Patient not taking: Reported on 01/04/2020 12/09/17   [name], MD  buPROPion (WELLBUTRIN XL) 300 MG 24 hr tablet TAKE 1 TABLET BY MOUTH EVERY DAY 06/07/19   [name], MD    No Known Allergies  Patient Active Problem List   Diagnosis Date Noted  . Chronic anxiety 06/28/2019  . History of peptic ulcer 04/30/2017  . Hyperlipidemia 12/11/2015  . Insomnia 12/11/2015  . Panic attack as reaction to stress 12/11/2015  . Current smoker 12/11/2015  . Chronic sinusitis 12/11/2015  . Dyslipidemia associated with type 2 diabetes mellitus (HCC) 05/15/2015  . Hypothyroid 04/29/2011  . BMI 40.0-44.9, adult (HCC) 04/29/2011  . Depression with anxiety 04/29/2011  . GERD (gastroesophageal reflux disease) 04/29/2011  .  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  . Nicotine addiction 04/29/2011  . Hypertension associated with diabetes (HCC) 04/29/2011  . Murmur, cardiac 04/29/2011    Past Medical History:  Diagnosis Date  . Allergy   . Anxiety   . Depression   . Heart murmur   . Hypertension   . Thyroid disease   . Ulcer     Past Surgical History:  Procedure Laterality Date  . BREAST SURGERY    . CESAREAN SECTION    . CHOLECYSTECTOMY    . NASAL SEPTUM SURGERY      Social History   Socioeconomic History  . Marital status: Divorced    Spouse name: Not on file  . Number of children: Not on file   . Years of education: Not on file  . Highest education level: Not on file  Occupational History  . Occupation: Unemployed  Tobacco Use  . Smoking status: Current Every Day Smoker    Packs/day: 1.50    Years: 40.00    Pack years: 60.00    Types: Cigarettes    Last attempt to quit: 01/20/2012    Years since quitting: 7.9  . Smokeless tobacco: Never Used  . Tobacco comment: Pt doing E-cigarette as of 01/20/12  Vaping Use  . Vaping Use: Former  Substance and Sexual Activity  . Alcohol use: No  . Drug use: No  . Sexual activity: Never  Other Topics Concern  . Not on file  Social History Narrative   Patient on worker's compensation for an injury sustained on the job 5 years ago.   Social Determinants of Health   Financial Resource Strain: Not on file  Food Insecurity: Not on file  Transportation Needs: Not on file  Physical Activity: Not on file  Stress: Not on file  Social Connections: Not on file  Intimate Partner Violence: Not on file    Family History  Problem Relation Age of Onset  . Lung cancer Mother   . Dementia Father   . Atrial fibrillation Father      Review of Systems  Constitutional: Negative.  Negative for chills and fever.  HENT: Positive for congestion and sinus pain.   Respiratory: Negative.  Negative for cough and shortness of breath.   Cardiovascular: Negative for chest pain and palpitations.  Gastrointestinal: Negative for abdominal pain, blood in stool, diarrhea, nausea and vomiting.  Genitourinary: Negative.  Negative for dysuria and hematuria.  Musculoskeletal: Positive for back pain and joint pain.  Skin: Negative.   Neurological: Negative.  Negative for dizziness and headaches.  All other systems reviewed and are negative.  Today's Vitals   01/04/20 0930  BP: 132/77  Pulse: 83  Resp: 16  Temp: 97.8 F (36.6 C)  TempSrc: Temporal  SpO2: 96%  Weight: 251 lb (113.9 kg)  Height: 5\' 9"  (1.753 m)   Body mass index is 37.07  kg/m.   Physical Exam Vitals reviewed.  Constitutional:      Appearance: Normal appearance.  HENT:     Head: Normocephalic.  Eyes:     Extraocular Movements: Extraocular movements intact.     Pupils: Pupils are equal, round, and reactive to light.  Cardiovascular:     Rate and Rhythm: Normal rate and regular rhythm.     Pulses: Normal pulses.     Heart sounds: Normal heart sounds.  Pulmonary:     Effort: Pulmonary effort is normal.     Breath sounds: Normal breath sounds.  Abdominal:     Palpations: Abdomen is  soft.     Tenderness: There is no abdominal tenderness.  Musculoskeletal:     Cervical back: Normal range of motion.     Lumbar back: Tenderness present. No bony tenderness. Decreased range of motion.     Right lower leg: No swelling. No edema.     Left lower leg: No swelling. No edema.     Right ankle: Normal.     Left ankle: Normal.     Right foot: Normal.     Left foot: Normal.  Skin:    General: Skin is warm and dry.     Capillary Refill: Capillary refill takes less than 2 seconds.  Neurological:     General: No focal deficit present.     Mental Status: She is alert and oriented to person, place, and time.  Psychiatric:        Mood and Affect: Mood normal.        Behavior: Behavior normal.      ASSESSMENT & PLAN: Dewayne Hatchnn was seen today for leg pain and sinus problem.  Diagnoses and all orders for this visit:  Chronic pain of left lower extremity -     MR Lumbar Spine Wo Contrast; Future -     Ambulatory referral to Orthopedic Surgery  Chronic maxillary sinusitis -     doxycycline (VIBRA-TABS) 100 MG tablet; Take 1 tablet (100 mg total) by mouth 2 (two) times daily.  Chronic left-sided low back pain with left-sided sciatica -     MR Lumbar Spine Wo Contrast; Future -     Ambulatory referral to Orthopedic Surgery  Impaired ambulation -     MR Lumbar Spine Wo Contrast; Future -     Ambulatory referral to Orthopedic Surgery    Patient Instructions        If you have lab work done today you will be contacted with your lab results within the next 2 weeks.  If you have not heard from us then please contact us. The fastest way to get your results is to register for My Chart.   IF you received an x-ray today, you will receive an invoice [facility]. Please contact [facility] at [phone] with questions or concerns regarding your invoice.   IF you received labwork today, you will receive an invoice from [facility]. Please contact [facility] at [phone] with questions or concerns regarding your invoice.   Our billing staff will not be able to assist you with questions regarding bills from these companies.  You will be contacted with the lab results as soon as they are available. The fastest way to get your results is to activate your My Chart account. Instructions are located on the last page of this paperwork. If you have not heard from us regarding the results in 2 weeks, please contact this office.     Radicular Pain Radicular pain is a type of pain that spreads from your back or neck along a spinal nerve. Spinal nerves are nerves that leave the spinal cord and go to the muscles. Radicular pain is sometimes called radiculopathy, radiculitis, or a pinched nerve. When you have this type of pain, you may also have weakness, numbness, or tingling in the area of your body that is supplied by the nerve. The pain may feel sharp and burning. Depending on which spinal nerve is affected, the pain may occur in the:  Neck area (cervical radicular pain). You may also feel pain, numbness, weakness, or tingling in the arms.  Mid-spine  area (thoracic radicular pain). You would feel this pain in the back and chest. This type is rare.  Lower back area (lumbar radicular pain). You would feel this pain as low back pain. You may feel pain, numbness, weakness, or tingling in the buttocks or legs. Sciatica is a type of lumbar  radicular pain that shoots down the back of the leg. Radicular pain occurs when one of the spinal nerves becomes irritated or squeezed (compressed). It is often caused by something pushing on a spinal nerve, such as one of the bones of the spine (vertebrae) or one of the round cushions between vertebrae (intervertebral disks). This can result from:  An injury.  Wear and tear or aging of a disk.  The growth of a bone spur that pushes on the nerve. Radicular pain often goes away when you follow instructions from your health care provider for relieving pain at home. Follow these instructions at home: Managing pain      If directed, put ice on the affected area: ? Put ice in a plastic bag. ? Place a towel between your skin and the bag. ? Leave the ice on for 20 minutes, 2-3 times a day.  If directed, apply heat to the affected area as often as told by your health care provider. Use the heat source that your health care provider recommends, such as a moist heat pack or a heating pad. ? Place a towel between your skin and the heat source. ? Leave the heat on for 20-30 minutes. ? Remove the heat if your skin turns bright red. This is especially important if you are unable to feel pain, heat, or cold. You may have a greater risk of getting burned. Activity   Do not sit or rest in bed for long periods of time.  Try to stay as active as possible. Ask your health care provider what type of exercise or activity is best for you.  Avoid activities that make your pain worse, such as bending and lifting.  Do not lift anything that is heavier than 10 lb (4.5 kg), or the limit that you are told, until your health care provider says that it is safe.  Practice using proper technique when lifting items. Proper lifting technique involves bending your knees and rising up.  Do strength and range-of-motion exercises only as told by your health care provider or physical therapist. General  instructions  Take over-the-counter and prescription medicines only as told by your health care provider.  Pay attention to any changes in your symptoms.  Keep all follow-up visits as told by your health care provider. This is important. ? Your health care provider may send you to a physical therapist to help with this pain. Contact a health care provider if:  Your pain and other symptoms get worse.  Your pain medicine is not helping.  Your pain has not improved after a few weeks of home care.  You have a fever. Get help right away if:  You have severe pain, weakness, or numbness.  You have difficulty with bladder or bowel control. Summary  Radicular pain is a type of pain that spreads from your back or neck along a spinal nerve.  When you have radicular pain, you may also have weakness, numbness, or tingling in the area of your body that is supplied by the nerve.  The pain may feel sharp or burning.  Radicular pain may be treated with ice, heat, medicines, or physical therapy. This information is  not intended to replace advice given to you by your health care provider. Make sure you discuss any questions you have with your health care provider. Document Revised: 07/27/2017 Document Reviewed: 07/27/2017 Elsevier Patient Education  2020 Elsevier [employer].      Edwina Barth, MD [facility]

## 2020-01-04 NOTE — Patient Instructions (Addendum)
   If you have lab work done today you will be contacted with your lab results within the next 2 weeks.  If you have not heard from us then please contact us. The fastest way to get your results is to register for My Chart.   IF you received an x-ray today, you will receive an invoice from [facility]. Please contact [facility] at [phone] with questions or concerns regarding your invoice.   IF you received labwork today, you will receive an invoice from [facility]. Please contact [facility] at [phone] with questions or concerns regarding your invoice.   Our billing staff will not be able to assist you with questions regarding bills from these companies.  You will be contacted with the lab results as soon as they are available. The fastest way to get your results is to activate your My Chart account. Instructions are located on the last page of this paperwork. If you have not heard from us regarding the results in 2 weeks, please contact this office.      Radicular Pain Radicular pain is a type of pain that spreads from your back or neck along a spinal nerve. Spinal nerves are nerves that leave the spinal cord and go to the muscles. Radicular pain is sometimes called radiculopathy, radiculitis, or a pinched nerve. When you have this type of pain, you may also have weakness, numbness, or tingling in the area of your body that is supplied by the nerve. The pain may feel sharp and burning. Depending on which spinal nerve is affected, the pain may occur in the:  Neck area (cervical radicular pain). You may also feel pain, numbness, weakness, or tingling in the arms.  Mid-spine area (thoracic radicular pain). You would feel this pain in the back and chest. This type is rare.  Lower back area (lumbar radicular pain). You would feel this pain as low back pain. You may feel pain, numbness, weakness, or tingling in the buttocks or legs. Sciatica is a type of lumbar radicular  pain that shoots down the back of the leg. Radicular pain occurs when one of the spinal nerves becomes irritated or squeezed (compressed). It is often caused by something pushing on a spinal nerve, such as one of the bones of the spine (vertebrae) or one of the round cushions between vertebrae (intervertebral disks). This can result from:  An injury.  Wear and tear or aging of a disk.  The growth of a bone spur that pushes on the nerve. Radicular pain often goes away when you follow instructions from your health care provider for relieving pain at home. Follow these instructions at home: Managing pain      If directed, put ice on the affected area: ? Put ice in a plastic bag. ? Place a towel between your skin and the bag. ? Leave the ice on for 20 minutes, 2-3 times a day.  If directed, apply heat to the affected area as often as told by your health care provider. Use the heat source that your health care provider recommends, such as a moist heat pack or a heating pad. ? Place a towel between your skin and the heat source. ? Leave the heat on for 20-30 minutes. ? Remove the heat if your skin turns bright red. This is especially important if you are unable to feel pain, heat, or cold. You may have a greater risk of getting burned. Activity   Do not sit or rest in bed   for long periods of time.  Try to stay as active as possible. Ask your health care provider what type of exercise or activity is best for you.  Avoid activities that make your pain worse, such as bending and lifting.  Do not lift anything that is heavier than 10 lb (4.5 kg), or the limit that you are told, until your health care provider says that it is safe.  Practice using proper technique when lifting items. Proper lifting technique involves bending your knees and rising up.  Do strength and range-of-motion exercises only as told by your health care provider or physical therapist. General instructions  Take  over-the-counter and prescription medicines only as told by your health care provider.  Pay attention to any changes in your symptoms.  Keep all follow-up visits as told by your health care provider. This is important. ? Your health care provider may send you to a physical therapist to help with this pain. Contact a health care provider if:  Your pain and other symptoms get worse.  Your pain medicine is not helping.  Your pain has not improved after a few weeks of home care.  You have a fever. Get help right away if:  You have severe pain, weakness, or numbness.  You have difficulty with bladder or bowel control. Summary  Radicular pain is a type of pain that spreads from your back or neck along a spinal nerve.  When you have radicular pain, you may also have weakness, numbness, or tingling in the area of your body that is supplied by the nerve.  The pain may feel sharp or burning.  Radicular pain may be treated with ice, heat, medicines, or physical therapy. This information is not intended to replace advice given to you by your health care provider. Make sure you discuss any questions you have with your health care provider. Document Revised: 07/27/2017 Document Reviewed: 07/27/2017 Elsevier Patient Education  2020 [employer] Inc.  

## 2020-01-11 ENCOUNTER — Other Ambulatory Visit: Payer: Self-pay | Admitting: Emergency Medicine

## 2020-01-11 DIAGNOSIS — I10 Essential (primary) hypertension: Secondary | ICD-10-CM

## 2020-01-12 ENCOUNTER — Ambulatory Visit: Payer: Medicare HMO | Admitting: Neurology

## 2020-01-18 ENCOUNTER — Telehealth (INDEPENDENT_AMBULATORY_CARE_PROVIDER_SITE_OTHER): Payer: Medicare HMO | Admitting: Emergency Medicine

## 2020-01-18 ENCOUNTER — Encounter: Payer: Self-pay | Admitting: Emergency Medicine

## 2020-01-18 ENCOUNTER — Other Ambulatory Visit: Payer: Self-pay

## 2020-01-18 ENCOUNTER — Telehealth: Payer: Self-pay | Admitting: Emergency Medicine

## 2020-01-18 VITALS — Ht 69.0 in | Wt 240.0 lb

## 2020-01-18 DIAGNOSIS — R300 Dysuria: Secondary | ICD-10-CM

## 2020-01-18 DIAGNOSIS — N390 Urinary tract infection, site not specified: Secondary | ICD-10-CM

## 2020-01-18 MED ORDER — CEFUROXIME AXETIL 500 MG PO TABS: 500.0000 mg | ORAL_TABLET | Freq: Two times a day (BID) | ORAL | 0 refills | Status: AC

## 2020-01-18 NOTE — Telephone Encounter (Signed)
Called pt and sch her for a in ov visit for 01/24/20

## 2020-01-18 NOTE — Telephone Encounter (Signed)
Dr Alvy Bimler was advised of appointment 01/24/2020.

## 2020-01-18 NOTE — Progress Notes (Signed)
Telemedicine Encounter- SOAP NOTE Established Patient Patient: Home  Provider: Office     This telephone encounter was conducted with the patient's (or proxy's) verbal consent via audio telecommunications: yes/no: Yes Patient was instructed to have this encounter in a suitably private space; and to only have persons present to whom they give permission to participate. In addition, patient identity was confirmed by use of name plus two identifiers (DOB and address).  I discussed the limitations, risks, security and privacy concerns of performing an evaluation and management service by telephone and the availability of in person appointments. I also discussed with the patient that there may be a patient responsible charge related to this service. The patient expressed understanding and agreed to proceed.  I spent a total of TIME; 0 MIN TO 60 MIN: 20 minutes talking with the patient or their proxy.  Chief Complaint  Patient presents with  . Recurrent UTI    Per patient it started 2 days ago with burning without odor and she is drinking Cranberry juice    Subjective   Jasmine Long is a 69 y.o. female established patient. Telephone visit today complaining of burning on urination and frequency that started 2 days ago.  Denies fever or chills.  Able to eat and drink.  Denies nausea or vomiting.  Denies flank pain. No other significant symptoms.  Has been drinking cranberry juice.  Recent UTI last November.  Urine culture showed urogenital flora. Had a UTI last July.  Urine culture showed E. coli resistant to ampicillin, quinolones, sulfas, and tetracycline. No other complaints or medical concerns today.  HPI   Patient Active Problem List   Diagnosis Date Noted  . Chronic anxiety 06/28/2019  . History of peptic ulcer 04/30/2017  . Hyperlipidemia 12/11/2015  . Insomnia 12/11/2015  . Panic attack as reaction to stress 12/11/2015  . Current smoker 12/11/2015  . Chronic sinusitis 12/11/2015   . Dyslipidemia associated with type 2 diabetes mellitus (HCC) 05/15/2015  . Hypothyroid 04/29/2011  . BMI 40.0-44.9, adult (HCC) 04/29/2011  . Depression with anxiety 04/29/2011  . GERD (gastroesophageal reflux disease) 04/29/2011  . 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  . Nicotine addiction 04/29/2011  . Hypertension associated with diabetes (HCC) 04/29/2011  . Murmur, cardiac 04/29/2011    Past Medical History:  Diagnosis Date  . Allergy   . Anxiety   . Depression   . Heart murmur   . Hypertension   . Thyroid disease   . Ulcer     Current Outpatient Medications  Medication Sig Dispense Refill  . ALPRAZolam (XANAX) 0.5 MG tablet TAKE 1 TABLET BY MOUTH EVERY DAY AS NEEDED FOR ANXIETY 30 tablet 1  . atorvastatin (LIPITOR) 20 MG tablet TAKE 1 TABLET BY MOUTH EVERY DAY 90 tablet 3  . buPROPion (WELLBUTRIN XL) 300 MG 24 hr tablet TAKE 1 TABLET BY MOUTH EVERY DAY 90 tablet 0  . clonazePAM (KLONOPIN) 1 MG tablet TAKE 1 TABLET BY MOUTH AT BEDTIME FOR SLEEP OR ANXIETY 30 tablet 1  . doxycycline (VIBRA-TABS) 100 MG tablet Take 1 tablet (100 mg total) by mouth 2 (two) times daily. (Patient not taking: Reported on 01/04/2020) 20 tablet 0  . doxycycline (VIBRA-TABS) 100 MG tablet Take 1 tablet (100 mg total) by mouth 2 (two) times daily. 20 tablet 0  . guaiFENesin (MUCINEX) 600 MG 12 hr tablet Take 2 tablets (1,200 mg total) by mouth 2 (two) times daily. (Patient not taking: Reported on 01/04/2020) 30 tablet 1  . levothyroxine (  SYNTHROID) 125 MCG tablet TAKE 1 TABLET (125 MCG TOTAL) BY MOUTH DAILY BEFORE BREAKFAST. 90 tablet 2  . losartan (COZAAR) 100 MG tablet TAKE 1 TABLET BY MOUTH EVERY DAY 90 tablet 0  . metFORMIN (GLUCOPHAGE) 1000 MG tablet TAKE 1 TABLET BY MOUTH TWICE A DAY WITH MEALS 180 tablet 1  . metoprolol succinate (TOPROL-XL) 25 MG 24 hr tablet TAKE 1 TABLET BY MOUTH EVERY DAY 90 tablet 1  . montelukast (SINGULAIR) 10 MG tablet Take 1 tablet (10 mg total) by mouth at bedtime. (Patient  not taking: Reported on 01/04/2020) 30 tablet 3  . nicotine (NICODERM CQ - DOSED IN MG/24 HOURS) 21 mg/24hr patch Place 1 patch (21 mg total) onto the skin daily. 28 patch 3   No current facility-administered medications for this visit.    No Known Allergies  Social History   Socioeconomic History  . Marital status: Divorced    Spouse name: Not on file  . Number of children: Not on file  . Years of education: Not on file  . Highest education level: Not on file  Occupational History  . Occupation: Unemployed  Tobacco Use  . Smoking status: Current Every Day Smoker    Packs/day: 1.50    Years: 40.00    Pack years: 60.00    Types: Cigarettes    Last attempt to quit: 01/20/2012    Years since quitting: 8.0  . Smokeless tobacco: Never Used  . Tobacco comment: Pt doing E-cigarette as of 01/20/12  Vaping Use  . Vaping Use: Former  Substance and Sexual Activity  . Alcohol use: No  . Drug use: No  . Sexual activity: Never  Other Topics Concern  . Not on file  Social History Narrative   Patient on worker's compensation for an injury sustained on the job 5 years ago.   Social Determinants of Health   Financial Resource Strain: Not on file  Food Insecurity: Not on file  Transportation Needs: Not on file  Physical Activity: Not on file  Stress: Not on file  Social Connections: Not on file  Intimate Partner Violence: Not on file    Review of Systems  Constitutional: Negative.  Negative for chills and fever.  HENT: Negative.  Negative for congestion and sore throat.   Respiratory: Negative.  Negative for cough and shortness of breath.   Cardiovascular: Negative.  Negative for chest pain and palpitations.  Gastrointestinal: Negative for abdominal pain, blood in stool, diarrhea, nausea and vomiting.  Genitourinary: Positive for dysuria and frequency. Negative for flank pain and hematuria.  Skin: Negative.  Negative for rash.  Neurological: Negative.  Negative for dizziness and  headaches.  All other systems reviewed and are negative.   Objective  Alert and oriented x3 in no apparent respiratory distress. Vitals as reported by the patient: Today's Vitals   01/18/20 1016  Weight: 240 lb (108.9 kg)  Height: 5\' 9"  (1.753 m)    There are no diagnoses linked to this encounter. Jaskirat was seen today for recurrent uti.  Diagnoses and all orders for this visit:  Acute UTI -     cefUROXime (CEFTIN) 500 MG tablet; Take 1 tablet (500 mg total) by mouth 2 (two) times daily with a meal for 7 days.  Dysuria  Clinically stable.  No red flag signs or symptoms.  Medication as prescribed. ED precautions given. Office visit next week.   I discussed the assessment and treatment plan with the patient. The patient was provided an opportunity to ask  questions and all were answered. The patient agreed with the plan and demonstrated an understanding of the instructions.   The patient was advised to call back or seek an in-person evaluation if the symptoms worsen or if the condition fails to improve as anticipated.  I provided 20 minutes of non-face-to-face time during this encounter.  Horald Pollen, MD  Primary Care at [facility]

## 2020-01-24 ENCOUNTER — Ambulatory Visit (INDEPENDENT_AMBULATORY_CARE_PROVIDER_SITE_OTHER): Payer: Medicare HMO | Admitting: Emergency Medicine

## 2020-01-24 ENCOUNTER — Telehealth: Payer: Self-pay | Admitting: Emergency Medicine

## 2020-01-24 ENCOUNTER — Other Ambulatory Visit: Payer: Self-pay

## 2020-01-24 ENCOUNTER — Encounter: Payer: Self-pay | Admitting: Emergency Medicine

## 2020-01-24 VITALS — BP 130/80 | HR 100 | Temp 98.0°F | Ht 69.0 in

## 2020-01-24 DIAGNOSIS — R0981 Nasal congestion: Secondary | ICD-10-CM | POA: Diagnosis not present

## 2020-01-24 DIAGNOSIS — J320 Chronic maxillary sinusitis: Secondary | ICD-10-CM | POA: Diagnosis not present

## 2020-01-24 DIAGNOSIS — J410 Simple chronic bronchitis: Secondary | ICD-10-CM

## 2020-01-24 DIAGNOSIS — N390 Urinary tract infection, site not specified: Secondary | ICD-10-CM

## 2020-01-24 LAB — POCT URINALYSIS DIP (MANUAL ENTRY)
Bilirubin, UA: NEGATIVE
Glucose, UA: NEGATIVE mg/dL
Ketones, POC UA: NEGATIVE mg/dL
Leukocytes, UA: NEGATIVE
Nitrite, UA: NEGATIVE
Protein Ur, POC: NEGATIVE mg/dL
Spec Grav, UA: 1.015 (ref 1.010–1.025)
Urobilinogen, UA: 0.2 E.U./dL
pH, UA: 7 (ref 5.0–8.0)

## 2020-01-24 LAB — POC MICROSCOPIC URINALYSIS (UMFC): Mucus: ABSENT

## 2020-01-24 MED ORDER — GUAIFENESIN ER 600 MG PO TB12: 1200.0000 mg | ORAL_TABLET | Freq: Two times a day (BID) | ORAL | 1 refills | Status: DC

## 2020-01-24 NOTE — Progress Notes (Signed)
Jasmine Long 69 y.o.   Chief Complaint  Patient presents with   Urinary Tract Infection    Pt reports since last spoke with the provider the burning is gone. Pt reports no other symptoms to report. Pt states she has one dose left for the medication that was given to her for this during last virtual appt.   Nasal Congestion    Pt reports the congestion is gon, but she has noticed nasal drainage now. Pt reports a cough to get the drainage up.    HISTORY OF PRESENT ILLNESS: This is a 69 y.o. female here for follow-up of UTI.  Telemedicine visit 6 days ago with dysuria.  Started on Ceftin 500 mg twice a day. Asymptomatic.  Symptoms much improved. Also has history of chronic sinusitis.  Presently has clear nasal discharge. Smoker with chronic cough.  HPI   Prior to Admission medications   Medication Sig Start Date End Date Taking? Authorizing Provider  ALPRAZolam Prudy Feeler) 0.5 MG tablet TAKE 1 TABLET BY MOUTH EVERY DAY AS NEEDED FOR ANXIETY 12/05/19  Yes Louana Fontenot, Eilleen Kempf, MD  atorvastatin (LIPITOR) 20 MG tablet TAKE 1 TABLET BY MOUTH EVERY DAY 06/03/19  Yes Kenasia Scheller, Eilleen Kempf, MD  buPROPion (WELLBUTRIN XL) 300 MG 24 hr tablet TAKE 1 TABLET BY MOUTH EVERY DAY 12/07/19  Yes Nakota Ackert, Eilleen Kempf, MD  cefUROXime (CEFTIN) 500 MG tablet Take 1 tablet (500 mg total) by mouth 2 (two) times daily with a meal for 7 days. 01/18/20 01/25/20 Yes Jariah Jarmon, Eilleen Kempf, MD  clonazePAM (KLONOPIN) 1 MG tablet TAKE 1 TABLET BY MOUTH AT BEDTIME FOR SLEEP OR ANXIETY 04/24/19  Yes Glenette Bookwalter, Eilleen Kempf, MD  guaiFENesin (MUCINEX) 600 MG 12 hr tablet Take 2 tablets (1,200 mg total) by mouth 2 (two) times daily. 08/30/19  Yes [name], MD  levothyroxine (SYNTHROID) 125 MCG tablet TAKE 1 TABLET (125 MCG TOTAL) BY MOUTH DAILY BEFORE BREAKFAST. 10/17/19  Yes Chalise Pe, Eilleen Kempf, MD  losartan (COZAAR) 100 MG tablet TAKE 1 TABLET BY MOUTH EVERY DAY 01/11/20  Yes [name], MD  metFORMIN  (GLUCOPHAGE) 1000 MG tablet TAKE 1 TABLET BY MOUTH TWICE A DAY WITH MEALS 07/30/19  Yes Nester Bachus, Eilleen Kempf, MD  metoprolol succinate (TOPROL-XL) 25 MG 24 hr tablet TAKE 1 TABLET BY MOUTH EVERY DAY 06/03/19  Yes Zoey Bidwell, Eilleen Kempf, MD  montelukast (SINGULAIR) 10 MG tablet Take 1 tablet (10 mg total) by mouth at bedtime. 12/09/17  Yes Josedejesus Marcum, Eilleen Kempf, MD  nicotine (NICODERM CQ - DOSED IN MG/24 HOURS) 21 mg/24hr patch Place 1 patch (21 mg total) onto the skin daily. 04/20/18  Yes [name], MD  buPROPion (WELLBUTRIN XL) 300 MG 24 hr tablet TAKE 1 TABLET BY MOUTH EVERY DAY 06/07/19   [name], MD    No Known Allergies  Patient Active Problem List   Diagnosis Date Noted   Chronic anxiety 06/28/2019   History of peptic ulcer 04/30/2017   Hyperlipidemia 12/11/2015   Insomnia 12/11/2015   Panic attack as reaction to stress 12/11/2015   Current smoker 12/11/2015   Chronic sinusitis 12/11/2015   Dyslipidemia associated with type 2 diabetes mellitus (HCC) 05/15/2015   Hypothyroid 04/29/2011   BMI 40.0-44.9, adult (HCC) 04/29/2011   Depression with anxiety 04/29/2011   GERD (gastroesophageal reflux disease) 04/29/2011   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   Nicotine addiction 04/29/2011   Hypertension associated with diabetes (HCC) 04/29/2011   Murmur, cardiac 04/29/2011    Past Medical History:  Diagnosis Date   Allergy    Anxiety    Depression    Heart murmur    Hypertension    Thyroid disease    Ulcer     Past Surgical History:  Procedure Laterality Date   BREAST SURGERY     CESAREAN SECTION     CHOLECYSTECTOMY     NASAL SEPTUM SURGERY      Social History   Socioeconomic History   Marital status: Divorced    Spouse name: Not on file   Number of children: Not on file   Years of education: Not on file   Highest education level: Not on file  Occupational History   Occupation: Unemployed  Tobacco Use   Smoking  status: Current Every Day Smoker    Packs/day: 1.50    Years: 40.00    Pack years: 60.00    Types: Cigarettes    Last attempt to quit: 01/20/2012    Years since quitting: 8.0   Smokeless tobacco: Never Used   Tobacco comment: Pt doing E-cigarette as of 01/20/12  Vaping Use   Vaping Use: Former  Substance and Sexual Activity   Alcohol use: No   Drug use: No   Sexual activity: Never  Other Topics Concern   Not on file  Social History Narrative   Patient on worker's compensation for an injury sustained on the job 5 years ago.   Social Determinants of Health   Financial Resource Strain: Not on file  Food Insecurity: Not on file  Transportation Needs: Not on file  Physical Activity: Not on file  Stress: Not on file  Social Connections: Not on file  Intimate Partner Violence: Not on file    Family History  Problem Relation Age of Onset   Lung cancer Mother    Dementia Father    Atrial fibrillation Father      Review of Systems  Constitutional: Negative.  Negative for chills and fever.  HENT: Positive for congestion. Negative for sore throat.   Respiratory: Negative.  Negative for cough and shortness of breath.   Cardiovascular: Negative.  Negative for chest pain and palpitations.  Gastrointestinal: Negative.  Negative for abdominal pain, blood in stool, diarrhea, nausea and vomiting.  Genitourinary: Negative.  Negative for dysuria and hematuria.  Skin: Negative.   Neurological: Negative.  Negative for dizziness and headaches.  All other systems reviewed and are negative.  Today's Vitals   01/24/20 1131  BP: (!) 144/82  Pulse: 100  Temp: 98 F (36.7 C)  TempSrc: Temporal  SpO2: 96%  Height:  (1.753 m)   Body mass index is 35.44 kg/m.   Physical Exam Vitals reviewed.  Constitutional:      Appearance: Normal appearance.  HENT:     Head: Normocephalic.  Eyes:     Extraocular Movements: Extraocular movements intact.     Conjunctiva/sclera:  Conjunctivae normal.     Pupils: Pupils are equal, round, and reactive to light.  Cardiovascular:     Rate and Rhythm: Normal rate and regular rhythm.     Pulses: Normal pulses.     Heart sounds: Normal heart sounds.  Pulmonary:     Effort: Pulmonary effort is normal.     Breath sounds: Normal breath sounds.  Musculoskeletal:     Cervical back: Normal range of motion and neck supple.  Skin:    General: Skin is warm and dry.     Capillary Refill: Capillary refill takes less than 2 seconds.  Neurological:  General: No focal deficit present.     Mental Status: She is alert and oriented to person, place, and time.  Psychiatric:        Mood and Affect: Mood normal.        Behavior: Behavior normal.    Results for orders placed or performed in visit on 01/24/20 (from the past 24 hour(s))  POCT urinalysis dipstick     Status: Abnormal   Collection Time: 01/24/20 11:47 AM  Result Value Ref Range   Color, UA yellow yellow   Clarity, UA clear clear   Glucose, UA negative negative mg/dL   Bilirubin, UA negative negative   Ketones, POC UA negative negative mg/dL   Spec Grav, UA [id number] - 1.025   Blood, UA trace-intact (A) negative   pH, UA 7.0 5.0 - 8.0   Protein Ur, POC negative negative mg/dL   Urobilinogen, UA 0.2 0.2 or 1.0 E.U./dL   Nitrite, UA Negative Negative   Leukocytes, UA Negative Negative  POCT Microscopic Urinalysis ([facility])     Status: Abnormal   Collection Time: 01/24/20 12:17 PM  Result Value Ref Range   WBC,UR,HPF,POC None None WBC/hpf   RBC,UR,HPF,POC None None RBC/hpf   Bacteria None None, Too numerous to count   Mucus Absent Absent   Epithelial Cells, UR Per Microscopy Few (A) None, Too numerous to count cells/hpf     ASSESSMENT & PLAN: Dewayne Hatchnn was seen today for urinary tract infection and nasal congestion.  Diagnoses and all orders for this visit:  Acute UTI Comments: Much improved Orders: -     POCT urinalysis dipstick -     POCT Microscopic  Urinalysis ([facility]) -     Urine Culture  Smokers' cough (HCC)  Chronic maxillary sinusitis    Patient Instructions       If you have lab work done today you will be contacted with your lab results within the next 2 weeks.  If you have not heard from us then please contact us. The fastest way to get your results is to register for My Chart.   IF you received an x-ray today, you will receive an invoice from [facility]. Please contact [facility] at [phone] with questions or concerns regarding your invoice.   IF you received labwork today, you will receive an invoice from [facility]. Please contact [facility] at [phone] with questions or concerns regarding your invoice.   Our billing staff will not be able to assist you with questions regarding bills from these companies.  You will be contacted with the lab results as soon as they are available. The fastest way to get your results is to activate your My Chart account. Instructions are located on the last page of this paperwork. If you have not heard from us regarding the results in 2 weeks, please contact this office.     Health Maintenance After Age 67 After age 67, you are at a higher risk for certain long-term diseases and infections as well as injuries from falls. Falls are a major cause of broken bones and head injuries in people who are older than age 67. Getting regular preventive care can help to keep you healthy and well. Preventive care includes getting regular testing and making lifestyle changes as recommended by your health care provider. Talk with your health care provider about:  Which screenings and tests you should have. A screening is a test that checks for a disease when you have no symptoms.  A diet and exercise  plan that is right for you. What should I know about screenings and tests to prevent falls? Screening and testing are the best ways to find a health problem early. Early diagnosis and  treatment give you the best chance of managing medical conditions that are common after age 1. Certain conditions and lifestyle choices may make you more likely to have a fall. Your health care provider may recommend:  Regular vision checks. Poor vision and conditions such as cataracts can make you more likely to have a fall. If you wear glasses, make sure to get your prescription updated if your vision changes.  Medicine review. Work with your health care provider to regularly review all of the medicines you are taking, including over-the-counter medicines. Ask your health care provider about any side effects that may make you more likely to have a fall. Tell your health care provider if any medicines that you take make you feel dizzy or sleepy.  Osteoporosis screening. Osteoporosis is a condition that causes the bones to get weaker. This can make the bones weak and cause them to break more easily.  Blood pressure screening. Blood pressure changes and medicines to control blood pressure can make you feel dizzy.  Strength and balance checks. Your health care provider may recommend certain tests to check your strength and balance while standing, walking, or changing positions.  Foot health exam. Foot pain and numbness, as well as not wearing proper footwear, can make you more likely to have a fall.  Depression screening. You may be more likely to have a fall if you have a fear of falling, feel emotionally low, or feel unable to do activities that you used to do.  Alcohol use screening. Using too much alcohol can affect your balance and may make you more likely to have a fall. What actions can I take to lower my risk of falls? General instructions  Talk with your health care provider about your risks for falling. Tell your health care provider if: ? You fall. Be sure to tell your health care provider about all falls, even ones that seem minor. ? You feel dizzy, sleepy, or off-balance.  Take  over-the-counter and prescription medicines only as told by your health care provider. These include any supplements.  Eat a healthy diet and maintain a healthy weight. A healthy diet includes low-fat dairy products, low-fat (lean) meats, and fiber from whole grains, beans, and lots of fruits and vegetables. Home safety  Remove any tripping hazards, such as rugs, cords, and clutter.  Install safety equipment such as grab bars in bathrooms and safety rails on stairs.  Keep rooms and walkways well-lit. Activity   Follow a regular exercise program to stay fit. This will help you maintain your balance. Ask your health care provider what types of exercise are appropriate for you.  If you need a cane or walker, use it as recommended by your health care provider.  Wear supportive shoes that have nonskid soles. Lifestyle  Do not drink alcohol if your health care provider tells you not to drink.  If you drink alcohol, limit how much you have: ? 0-1 drink a day for women. ? 0-2 drinks a day for men.  Be aware of how much alcohol is in your drink. In the [place] one drink equals one typical bottle of beer (12 oz), one-half glass of wine (5 oz), or one shot of hard liquor (1 oz).  Do not use any products that contain nicotine or tobacco, such  as cigarettes and e-cigarettes. If you need help quitting, ask your health care provider. Summary  Having a healthy lifestyle and getting preventive care can help to protect your health and wellness after age 33.  Screening and testing are the best way to find a health problem early and help you avoid having a fall. Early diagnosis and treatment give you the best chance for managing medical conditions that are more common for people who are older than age 32.  Falls are a major cause of broken bones and head injuries in people who are older than age 30. Take precautions to prevent a fall at home.  Work with your health care provider to learn what changes  you can make to improve your health and wellness and to prevent falls. This information is not intended to replace advice given to you by your health care provider. Make sure you discuss any questions you have with your health care provider. Document Revised: 05/05/2018 Document Reviewed: 11/25/2016 Elsevier Patient Education  2020 [employer]      Edwina Barth, MD [facility]

## 2020-01-24 NOTE — Patient Instructions (Addendum)
   If you have lab work done today you will be contacted with your lab results within the next 2 weeks.  If you have not heard from us then please contact us. The fastest way to get your results is to register for My Chart.   IF you received an x-ray today, you will receive an invoice from [facility]. Please contact [facility] at [phone] with questions or concerns regarding your invoice.   IF you received labwork today, you will receive an invoice from [facility]. Please contact [facility] at [phone] with questions or concerns regarding your invoice.   Our billing staff will not be able to assist you with questions regarding bills from these companies.  You will be contacted with the lab results as soon as they are available. The fastest way to get your results is to activate your My Chart account. Instructions are located on the last page of this paperwork. If you have not heard from us regarding the results in 2 weeks, please contact this office.     Health Maintenance After Age 65 After age 65, you are at a higher risk for certain long-term diseases and infections as well as injuries from falls. Falls are a major cause of broken bones and head injuries in people who are older than age 65. Getting regular preventive care can help to keep you healthy and well. Preventive care includes getting regular testing and making lifestyle changes as recommended by your health care provider. Talk with your health care provider about:  Which screenings and tests you should have. A screening is a test that checks for a disease when you have no symptoms.  A diet and exercise plan that is right for you. What should I know about screenings and tests to prevent falls? Screening and testing are the best ways to find a health problem early. Early diagnosis and treatment give you the best chance of managing medical conditions that are common after age 65. Certain conditions and  lifestyle choices may make you more likely to have a fall. Your health care provider may recommend:  Regular vision checks. Poor vision and conditions such as cataracts can make you more likely to have a fall. If you wear glasses, make sure to get your prescription updated if your vision changes.  Medicine review. Work with your health care provider to regularly review all of the medicines you are taking, including over-the-counter medicines. Ask your health care provider about any side effects that may make you more likely to have a fall. Tell your health care provider if any medicines that you take make you feel dizzy or sleepy.  Osteoporosis screening. Osteoporosis is a condition that causes the bones to get weaker. This can make the bones weak and cause them to break more easily.  Blood pressure screening. Blood pressure changes and medicines to control blood pressure can make you feel dizzy.  Strength and balance checks. Your health care provider may recommend certain tests to check your strength and balance while standing, walking, or changing positions.  Foot health exam. Foot pain and numbness, as well as not wearing proper footwear, can make you more likely to have a fall.  Depression screening. You may be more likely to have a fall if you have a fear of falling, feel emotionally low, or feel unable to do activities that you used to do.  Alcohol use screening. Using too much alcohol can affect your balance and may make you more likely to   have a fall. What actions can I take to lower my risk of falls? General instructions  Talk with your health care provider about your risks for falling. Tell your health care provider if: ? You fall. Be sure to tell your health care provider about all falls, even ones that seem minor. ? You feel dizzy, sleepy, or off-balance.  Take over-the-counter and prescription medicines only as told by your health care provider. These include any  supplements.  Eat a healthy diet and maintain a healthy weight. A healthy diet includes low-fat dairy products, low-fat (lean) meats, and fiber from whole grains, beans, and lots of fruits and vegetables. Home safety  Remove any tripping hazards, such as rugs, cords, and clutter.  Install safety equipment such as grab bars in bathrooms and safety rails on stairs.  Keep rooms and walkways well-lit. Activity   Follow a regular exercise program to stay fit. This will help you maintain your balance. Ask your health care provider what types of exercise are appropriate for you.  If you need a cane or walker, use it as recommended by your health care provider.  Wear supportive shoes that have nonskid soles. Lifestyle  Do not drink alcohol if your health care provider tells you not to drink.  If you drink alcohol, limit how much you have: ? 0-1 drink a day for women. ? 0-2 drinks a day for men.  Be aware of how much alcohol is in your drink. In the [place] one drink equals one typical bottle of beer (12 oz), one-half glass of wine (5 oz), or one shot of hard liquor (1 oz).  Do not use any products that contain nicotine or tobacco, such as cigarettes and e-cigarettes. If you need help quitting, ask your health care provider. Summary  Having a healthy lifestyle and getting preventive care can help to protect your health and wellness after age 65.  Screening and testing are the best way to find a health problem early and help you avoid having a fall. Early diagnosis and treatment give you the best chance for managing medical conditions that are more common for people who are older than age 65.  Falls are a major cause of broken bones and head injuries in people who are older than age 65. Take precautions to prevent a fall at home.  Work with your health care provider to learn what changes you can make to improve your health and wellness and to prevent falls. This information is not intended  to replace advice given to you by your health care provider. Make sure you discuss any questions you have with your health care provider. Document Revised: 05/05/2018 Document Reviewed: 11/25/2016 Elsevier Patient Education  2020 Elsevier Inc.  

## 2020-01-24 NOTE — Addendum Note (Signed)
Addended by: Evie Lacks on: 01/24/2020 02:35 PM   Modules accepted: Orders

## 2020-01-25 ENCOUNTER — Other Ambulatory Visit (HOSPITAL_BASED_OUTPATIENT_CLINIC_OR_DEPARTMENT_OTHER): Payer: Self-pay | Admitting: Emergency Medicine

## 2020-01-25 ENCOUNTER — Encounter (HOSPITAL_BASED_OUTPATIENT_CLINIC_OR_DEPARTMENT_OTHER): Payer: Self-pay

## 2020-01-25 ENCOUNTER — Emergency Department (HOSPITAL_BASED_OUTPATIENT_CLINIC_OR_DEPARTMENT_OTHER)
Admission: EM | Admit: 2020-01-25 | Discharge: 2020-01-25 | Disposition: A | Discharge status: Home / Self Care | Payer: Medicare HMO | Attending: Emergency Medicine | Admitting: Emergency Medicine

## 2020-01-25 DIAGNOSIS — I10 Essential (primary) hypertension: Secondary | ICD-10-CM | POA: Insufficient documentation

## 2020-01-25 DIAGNOSIS — F17210 Nicotine dependence, cigarettes, uncomplicated: Secondary | ICD-10-CM | POA: Diagnosis not present

## 2020-01-25 DIAGNOSIS — Z7984 Long term (current) use of oral hypoglycemic drugs: Secondary | ICD-10-CM | POA: Insufficient documentation

## 2020-01-25 DIAGNOSIS — E039 Hypothyroidism, unspecified: Secondary | ICD-10-CM | POA: Diagnosis not present

## 2020-01-25 DIAGNOSIS — Z79899 Other long term (current) drug therapy: Secondary | ICD-10-CM | POA: Insufficient documentation

## 2020-01-25 DIAGNOSIS — R110 Nausea: Secondary | ICD-10-CM | POA: Diagnosis not present

## 2020-01-25 DIAGNOSIS — R457 State of emotional shock and stress, unspecified: Secondary | ICD-10-CM | POA: Diagnosis not present

## 2020-01-25 DIAGNOSIS — M62838 Other muscle spasm: Secondary | ICD-10-CM | POA: Diagnosis not present

## 2020-01-25 DIAGNOSIS — R6889 Other general symptoms and signs: Secondary | ICD-10-CM | POA: Diagnosis not present

## 2020-01-25 DIAGNOSIS — R52 Pain, unspecified: Secondary | ICD-10-CM | POA: Diagnosis not present

## 2020-01-25 DIAGNOSIS — E119 Type 2 diabetes mellitus without complications: Secondary | ICD-10-CM | POA: Insufficient documentation

## 2020-01-25 DIAGNOSIS — Z743 Need for continuous supervision: Secondary | ICD-10-CM | POA: Diagnosis not present

## 2020-01-25 DIAGNOSIS — M542 Cervicalgia: Secondary | ICD-10-CM | POA: Diagnosis present

## 2020-01-25 MED ORDER — HYDROCODONE-ACETAMINOPHEN 5-325 MG PO TABS
1.0000 | ORAL_TABLET | Freq: Four times a day (QID) | ORAL | 0 refills | Status: DC | PRN
Start: 2020-01-25 — End: 2020-03-26

## 2020-01-25 MED ORDER — CYCLOBENZAPRINE HCL 10 MG PO TABS: 10.0000 mg | ORAL_TABLET | Freq: Three times a day (TID) | ORAL | 0 refills | Status: DC

## 2020-01-25 MED FILL — CYCLOBENZAPRINE HCL 10 MG T: 10 | 6 days supply | Qty: 20 | Fill #0

## 2020-01-25 MED FILL — HYDROCODON-APAP 5-325: 5-325 | 3 days supply | Qty: 14 | Fill #0

## 2020-01-25 NOTE — ED Notes (Signed)
Pt complaining of nausea, provided with emesis bag

## 2020-01-25 NOTE — ED Triage Notes (Signed)
Pt reports neck pain since yesterday that has gotten worse. Pt states she got a Botox injection on 12/7 in the area where the pain is. Pt also reports nausea due to pain. Denies nausea at this time.

## 2020-01-25 NOTE — ED Provider Notes (Signed)
[facility] EMERGENCY DEPARTMENT Provider Note   CSN: [id number] Arrival date & time: 01/25/20  7096     History Chief Complaint  Patient presents with  . Neck Pain    Jasmine Long is a 69 y.o. female.  Patient has a history of occipital migraines.  Followed by neurology group in [place] she frequently gets Botox injections usually at the base of her skull.  But this time they did an injection in the cervical neck area.  That was on 17 December.  She started with pain in her neck yesterday.  It is bilateral and goes up into the occipital part.  And up on top of her head.  But says is different in reciprocal migraine.  It is painful to move the neck.  No fevers no chills no body aches no low back pain.  Patient is already on Xanax.  Takes it at night for sleep.  Patient not able to get in contact with her neurology group.  Saw her primary care doctor yesterday.  But things have gotten worse since then.  She does have him available for follow-up as well.  Patient was concerned about having meningitis.        Past Medical History:  Diagnosis Date  . Allergy   . Anxiety   . Depression   . Heart murmur   . Hypertension   . Thyroid disease   . Ulcer     Patient Active Problem List   Diagnosis Date Noted  . Chronic anxiety 06/28/2019  . History of peptic ulcer 04/30/2017  . Hyperlipidemia 12/11/2015  . Insomnia 12/11/2015  . Panic attack as reaction to stress 12/11/2015  . Current smoker 12/11/2015  . Chronic sinusitis 12/11/2015  . Dyslipidemia associated with type 2 diabetes mellitus (HCC) 05/15/2015  . Hypothyroid 04/29/2011  . BMI 40.0-44.9, adult (HCC) 04/29/2011  . Depression with anxiety 04/29/2011  . GERD (gastroesophageal reflux disease) 04/29/2011  . 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  . Nicotine addiction 04/29/2011  . Hypertension associated with diabetes (HCC) 04/29/2011  . Murmur, cardiac 04/29/2011    Past Surgical History:  Procedure Laterality  Date  . BREAST SURGERY    . CESAREAN SECTION    . CHOLECYSTECTOMY    . NASAL SEPTUM SURGERY       OB History   No obstetric history on file.     Family History  Problem Relation Age of Onset  . Lung cancer Mother   . Dementia Father   . Atrial fibrillation Father     Social History   Tobacco Use  . Smoking status: Current Every Day Smoker    Packs/day: 1.50    Years: 40.00    Pack years: 60.00    Types: Cigarettes    Last attempt to quit: 01/20/2012    Years since quitting: 8.0  . Smokeless tobacco: Never Used  . Tobacco comment: Pt doing E-cigarette as of 01/20/12  Vaping Use  . Vaping Use: Former  Substance Use Topics  . Alcohol use: No  . Drug use: No    Home Medications Prior to Admission medications   Medication Sig Start Date End Date Taking? Authorizing Provider  cyclobenzaprine (FLEXERIL) 10 MG tablet Take 1 tablet (10 mg total) by mouth 3 (three) times daily. 01/25/20  Yes Vanetta Mulders, MD  HYDROcodone-acetaminophen (NORCO/VICODIN) 5-325 MG tablet Take 1 tablet by mouth every 6 (six) hours as needed for moderate pain. 01/25/20  Yes Vanetta Mulders, MD  ALPRAZolam Prudy Feeler) 0.5 MG tablet  TAKE 1 TABLET BY MOUTH EVERY DAY AS NEEDED FOR ANXIETY 12/05/19   [name], MD  atorvastatin (LIPITOR) 20 MG tablet TAKE 1 TABLET BY MOUTH EVERY DAY 06/03/19   [name], MD  buPROPion (WELLBUTRIN XL) 300 MG 24 hr tablet TAKE 1 TABLET BY MOUTH EVERY DAY 12/07/19   [name], MD  cefUROXime (CEFTIN) 500 MG tablet Take 1 tablet (500 mg total) by mouth 2 (two) times daily with a meal for 7 days. 01/18/20 01/25/20  [name], MD  clonazePAM (KLONOPIN) 1 MG tablet TAKE 1 TABLET BY MOUTH AT BEDTIME FOR SLEEP OR ANXIETY 04/24/19   [name], MD  guaiFENesin (MUCINEX) 600 MG 12 hr tablet Take 2 tablets (1,200 mg total) by mouth 2 (two) times daily. 01/24/20   [name], MD  levothyroxine (SYNTHROID) 125 MCG tablet  TAKE 1 TABLET (125 MCG TOTAL) BY MOUTH DAILY BEFORE BREAKFAST. 10/17/19   [name], MD  losartan (COZAAR) 100 MG tablet TAKE 1 TABLET BY MOUTH EVERY DAY 01/11/20   [name], MD  metFORMIN (GLUCOPHAGE) 1000 MG tablet TAKE 1 TABLET BY MOUTH TWICE A DAY WITH MEALS 07/30/19   [name], MD  metoprolol succinate (TOPROL-XL) 25 MG 24 hr tablet TAKE 1 TABLET BY MOUTH EVERY DAY 06/03/19   [name], MD  montelukast (SINGULAIR) 10 MG tablet Take 1 tablet (10 mg total) by mouth at bedtime. 12/09/17   [name], MD  nicotine (NICODERM CQ - DOSED IN MG/24 HOURS) 21 mg/24hr patch Place 1 patch (21 mg total) onto the skin daily. 04/20/18   [name], MD    Allergies    Patient has no known allergies.  Review of Systems   Review of Systems  Constitutional: Negative for chills and fever.  HENT: Negative for congestion, rhinorrhea and sore throat.   Eyes: Negative for visual disturbance.  Respiratory: Negative for cough and shortness of breath.   Cardiovascular: Negative for chest pain and leg swelling.  Gastrointestinal: Negative for abdominal pain, diarrhea, nausea and vomiting.  Genitourinary: Negative for dysuria.  Musculoskeletal: Positive for neck pain and neck stiffness. Negative for back pain and myalgias.  Skin: Negative for rash.  Neurological: Positive for numbness. Negative for dizziness, speech difficulty, weakness, light-headedness and headaches.  Hematological: Does not bruise/bleed easily.  Psychiatric/Behavioral: Negative for confusion.    Physical Exam Updated Vital Signs BP (!) 184/60 (BP Location: Left Arm)   Pulse 85   Temp 98.3 F (36.8 C) (Oral)   Resp 20   Ht 1.753 m (5\' 9" )   Wt 104.3 kg   SpO2 98%   BMI 33.97 kg/m   Physical Exam Vitals and nursing note reviewed.  Constitutional:      General: She is not in acute distress.    Appearance: Normal appearance. She is well-developed and well-nourished.  She is not ill-appearing, toxic-appearing or diaphoretic.  HENT:     Head: Normocephalic and atraumatic.  Eyes:     Extraocular Movements: Extraocular movements intact.     Conjunctiva/sclera: Conjunctivae normal.     Pupils: Pupils are equal, round, and reactive to light.  Neck:     Comments: Tenderness to palpation to posterior cervical muscles.  No rash no erythema.  It is bilateral.  Patient with some movement of her head left and right and up and down.   Cardiovascular:     Rate and Rhythm: Normal rate and regular rhythm.  Heart sounds: No murmur heard.   Pulmonary:     Effort: Pulmonary effort is normal. No respiratory distress.     Breath sounds: Normal breath sounds.  Abdominal:     Palpations: Abdomen is soft.     Tenderness: There is no abdominal tenderness.  Musculoskeletal:        General: No edema.     Cervical back: Normal range of motion and neck supple.     Comments: No tenderness to palpation to the thoracic or lumbar spine area.  Some numbness to the ring finger and little finger of the right hand which is chronic.  No numbness to the left upper extremity.  Skin:    General: Skin is warm and dry.     Capillary Refill: Capillary refill takes less than 2 seconds.  Neurological:     General: No focal deficit present.     Mental Status: She is alert and oriented to person, place, and time.     Cranial Nerves: No cranial nerve deficit.     Sensory: No sensory deficit.     Motor: No weakness.  Psychiatric:        Mood and Affect: Mood and affect normal.     ED Results / Procedures / Treatments   Labs (all labs ordered are listed, but only abnormal results are displayed) Labs Reviewed - No data to display  EKG None  Radiology No results found.  Procedures Procedures (including critical care time)  Medications Ordered in ED Medications - No data to display  ED Course  I have reviewed the triage vital signs and the nursing notes.  Pertinent labs  & imaging results that were available during my care of the patient were reviewed by me and considered in my medical decision making (see chart for details).    MDM Rules/Calculators/A&P                          Patient nontoxic.  Certainly under a lot of discomfort.  Did offer pain medication here she did not want it IM or IV.  Did screen database.  Patient has not received any narcotics recently.  Patient is on Xanax on a regular basis.  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  Did have a Botox injection on the 17th.  Patient certainly clinically does not appear to have meningitis.  Seems to be muscle spasm in that area.  Will treat symptomatically.  Patient will follow up with her neurology group.  She has been unable to contact them by phone today.  But she will keep trying.  We will go ahead and treat with Flexeril hydrocodone and then have her continue her Xanax.  Do not see any need to contact teleneurology today over this.  Feel that patient should be treated symptomatically first.      Final Clinical Impression(s) / ED Diagnoses Final diagnoses:  Muscle spasms of neck    Rx / DC Orders ED Discharge Orders         Ordered    HYDROcodone-acetaminophen (NORCO/VICODIN) 5-325 MG tablet  Every 6 hours PRN        01/25/20 1147    cyclobenzaprine (FLEXERIL) 10 MG tablet  3 times daily        01/25/20 1147           Vanetta Mulders, MD 01/25/20 1154

## 2020-01-25 NOTE — Discharge Instructions (Addendum)
Contact your neurology group on Monday for follow-up.  In the meantime take the hydrocodone and the Flexeril as directed.  Would recommend continuing your Xanax which could be helpful.  No signs of any serious or complicating factors at this time.  Return for any new or worse symptoms.  Clinically not consistent with meningitis

## 2020-01-25 NOTE — ED Notes (Signed)
Ambulatory to restroom without assistance. Pt states she also took an alprazolam prior to arrival for anxiety

## 2020-01-27 ENCOUNTER — Other Ambulatory Visit: Payer: Self-pay | Admitting: Emergency Medicine

## 2020-01-27 LAB — URINE CULTURE

## 2020-01-27 MED ORDER — CEFUROXIME AXETIL 500 MG PO TABS: 500.0000 mg | ORAL_TABLET | Freq: Two times a day (BID) | ORAL | 0 refills | Status: AC

## 2020-02-02 ENCOUNTER — Ambulatory Visit: Payer: Medicare HMO | Admitting: Family Medicine

## 2020-02-05 ENCOUNTER — Ambulatory Visit (INDEPENDENT_AMBULATORY_CARE_PROVIDER_SITE_OTHER): Payer: Medicare HMO | Admitting: Family Medicine

## 2020-02-05 ENCOUNTER — Encounter: Payer: Self-pay | Admitting: Family Medicine

## 2020-02-05 ENCOUNTER — Ambulatory Visit (INDEPENDENT_AMBULATORY_CARE_PROVIDER_SITE_OTHER): Payer: Medicare HMO

## 2020-02-05 DIAGNOSIS — M25552 Pain in left hip: Secondary | ICD-10-CM

## 2020-02-05 DIAGNOSIS — G8929 Other chronic pain: Secondary | ICD-10-CM

## 2020-02-05 DIAGNOSIS — M5442 Lumbago with sciatica, left side: Secondary | ICD-10-CM

## 2020-02-05 NOTE — Patient Instructions (Signed)
    Please request records from [facility] and from Dr. Clarisse Gouge.

## 2020-02-05 NOTE — Progress Notes (Addendum)
Office Visit Note   Patient: Jasmine Long           Date of Birth: August 01, 1950           MRN: [id number] Visit Date: 02/05/2020 Requested by: [name], MD [address] [place],  [place] [address] PCP: [name], MD  Subjective: Chief Complaint  Patient presents with  . Lower Back - Pain    Numbness in the left lateral leg (down to foot). Pains shoot down the leg, from the groin and down to the knee. Has had this "steady" x 7 years.She had fallen on a slippery floor, when she worked at [employer]. Problems since then. Worsening. Leg gives way sometimes.     HPI: She is here with left hip and leg pain.  In 2014 she was working at [employer], slipped and fell injuring her left hip.  She went to a doctor who took some x-rays, ultimately she had an MRI scan of her hip which showed a stress fracture of the acetabulum.  She was referred to physical therapy but mainly they were working on her arm rather than her left leg.    She was referred for injections of some sort.  She is not sure whether they were epidural injections or intra-articular hip injections.  None of the injections gave complete relief of symptoms.  She states that she never recovered, she was continuing to have numbness down the leg toward the calf and eventually was treated by Dr. Clarisse Gouge who suggested nerve studies, but [employer] did not approve those.  She has been dealing with her pain since then.  Pain is worst in the left groin area.               ROS:   All other systems were reviewed and are negative.  Objective: Vital Signs: There were no vitals taken for this visit.  Physical Exam:  General:  Alert and oriented, in no acute distress. Pulm:  Breathing unlabored. Psy:  Normal mood, congruent affect.  Low back: She is not significantly tender over the lumbar spine or the sciatic notch.  There is slight tenderness over the left greater trochanter.  She has negative straight leg raise, and 5/5  lower extremity strength with 2+ DTRs. Left hip: She has quite a bit of pain with passive hip flexion and internal rotation.    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  XR Lumbar Spine 2-3 Views  Result Date: 02/05/2020 Lumbar x-rays reveal multilevel degenerative disc disease.  No signs of neoplasm.   Assessment & Plan: 1. 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  This would not explain the numbness sensation. -We will request records from previous treating physicians to determine what diagnostic injections have been done.  Depending on those results, I would consider a diagnostic intra-articular hip injection for her.  I do think at some point she may need a hip replacement.  I do not think hip is responsible for the numbness, however.     Procedures: No procedures performed        PMFS History: Patient Active Problem List   Diagnosis Date Noted  . Chronic anxiety 06/28/2019  . History of peptic ulcer 04/30/2017  . Hyperlipidemia 12/11/2015  .  Insomnia 12/11/2015  . Panic attack as reaction to stress 12/11/2015  . Current smoker 12/11/2015  . Chronic sinusitis 12/11/2015  . Dyslipidemia associated with type 2 diabetes mellitus (HCC) 05/15/2015  . Hypothyroid 04/29/2011  . BMI 40.0-44.9, adult (HCC) 04/29/2011  . Depression with anxiety 04/29/2011  . GERD (gastroesophageal reflux disease) 04/29/2011  . 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  . Nicotine addiction 04/29/2011  . Hypertension associated with diabetes (HCC) 04/29/2011  . Murmur, cardiac 04/29/2011   Past Medical History:  Diagnosis Date  . Allergy   . Anxiety   . Depression   . Heart murmur   . Hypertension   . Thyroid disease   . Ulcer     Family History  Problem Relation Age of Onset  . Lung  cancer Mother   . Dementia Father   . Atrial fibrillation Father     Past Surgical History:  Procedure Laterality Date  . BREAST SURGERY    . CESAREAN SECTION    . CHOLECYSTECTOMY    . NASAL SEPTUM SURGERY     Social History   Occupational History  . Occupation: Unemployed  Tobacco Use  . Smoking status: Current Every Day Smoker    Packs/day: 1.50    Years: 40.00    Pack years: 60.00    Types: Cigarettes    Last attempt to quit: 01/20/2012    Years since quitting: 8.0  . Smokeless tobacco: Never Used  . Tobacco comment: Pt doing E-cigarette as of 01/20/12  Vaping Use  . Vaping Use: Former  Substance and Sexual Activity  . Alcohol use: No  . Drug use: No  . Sexual activity: Never

## 2020-03-01 ENCOUNTER — Other Ambulatory Visit: Payer: Self-pay | Admitting: Emergency Medicine

## 2020-03-01 DIAGNOSIS — F418 Other specified anxiety disorders: Secondary | ICD-10-CM

## 2020-03-01 NOTE — Telephone Encounter (Signed)
Requested medications are due for refill today yes  Requested medications are on the active medication list yes  Last refill 11/11  Last visit 06/2019  Future visit scheduled no  Notes to clinic Has already had a curtesy refill and there is no upcoming appointment scheduled.

## 2020-03-02 ENCOUNTER — Other Ambulatory Visit: Payer: Self-pay | Admitting: Emergency Medicine

## 2020-03-02 DIAGNOSIS — E119 Type 2 diabetes mellitus without complications: Secondary | ICD-10-CM

## 2020-03-04 NOTE — Telephone Encounter (Signed)
Patient is requesting a refill of the following medications: Requested Prescriptions   Pending Prescriptions Disp Refills   buPROPion (WELLBUTRIN XL) 300 MG 24 hr tablet [Pharmacy Med Name: BUPROPION HCL XL 300 MG TABLET] 90 tablet 0    Sig: TAKE 1 TABLET BY MOUTH EVERY DAY    Date of patient request: 03/01/2020 Last office visit: 01/24/2020 Date of last refill: 12/07/2019 Last refill amount: 90

## 2020-03-22 ENCOUNTER — Telehealth: Payer: Self-pay | Admitting: Emergency Medicine

## 2020-03-22 NOTE — Telephone Encounter (Signed)
What is the name of the medication? A script for a uti  Have you contacted your pharmacy to request a refill? She wants this. I informed her she would need an appointment, she still wants a message put in.   Which pharmacy would you like this sent to? Pharmacy  [id number] [place], [facility] - [address]  [address], [place] [address]  Phone:  [phone] Fax:  [phone]  DEA #:  [id number]      Patient notified that their request is being sent to the clinical staff for review and that they should receive a call once it is complete. If they do not receive a call within 72 hours they can check with their pharmacy or our office.

## 2020-03-25 NOTE — Telephone Encounter (Signed)
Patient has an appointment tomorrow 

## 2020-03-26 ENCOUNTER — Ambulatory Visit: Payer: Medicare HMO | Admitting: Family Medicine

## 2020-03-26 ENCOUNTER — Other Ambulatory Visit: Payer: Self-pay | Admitting: Emergency Medicine

## 2020-03-26 ENCOUNTER — Other Ambulatory Visit: Payer: Self-pay

## 2020-03-26 ENCOUNTER — Ambulatory Visit (INDEPENDENT_AMBULATORY_CARE_PROVIDER_SITE_OTHER): Payer: Medicare HMO | Admitting: Emergency Medicine

## 2020-03-26 ENCOUNTER — Encounter: Payer: Self-pay | Admitting: Emergency Medicine

## 2020-03-26 VITALS — BP 140/80 | HR 92 | Temp 97.9°F | Ht 69.0 in | Wt 244.2 lb

## 2020-03-26 DIAGNOSIS — R0981 Nasal congestion: Secondary | ICD-10-CM

## 2020-03-26 DIAGNOSIS — I152 Hypertension secondary to endocrine disorders: Secondary | ICD-10-CM

## 2020-03-26 DIAGNOSIS — E039 Hypothyroidism, unspecified: Secondary | ICD-10-CM

## 2020-03-26 DIAGNOSIS — N390 Urinary tract infection, site not specified: Secondary | ICD-10-CM | POA: Diagnosis not present

## 2020-03-26 DIAGNOSIS — E1159 Type 2 diabetes mellitus with other circulatory complications: Secondary | ICD-10-CM | POA: Diagnosis not present

## 2020-03-26 DIAGNOSIS — R350 Frequency of micturition: Secondary | ICD-10-CM

## 2020-03-26 DIAGNOSIS — J320 Chronic maxillary sinusitis: Secondary | ICD-10-CM

## 2020-03-26 LAB — POCT URINALYSIS DIP (MANUAL ENTRY)
Bilirubin, UA: NEGATIVE
Glucose, UA: NEGATIVE mg/dL
Ketones, POC UA: NEGATIVE mg/dL
Nitrite, UA: POSITIVE — AB
Spec Grav, UA: 1.015 (ref 1.010–1.025)
Urobilinogen, UA: 0.2 E.U./dL
pH, UA: 6 (ref 5.0–8.0)

## 2020-03-26 LAB — POCT GLYCOSYLATED HEMOGLOBIN (HGB A1C): Hemoglobin A1C: 6.3 % — AB (ref 4.0–5.6)

## 2020-03-26 LAB — POC MICROSCOPIC URINALYSIS (UMFC): Mucus: ABSENT

## 2020-03-26 LAB — GLUCOSE, POCT (MANUAL RESULT ENTRY): POC Glucose: 99 mg/dl (ref 70–99)

## 2020-03-26 MED ORDER — CIPROFLOXACIN HCL 500 MG PO TABS: 500.0000 mg | ORAL_TABLET | Freq: Two times a day (BID) | ORAL | 0 refills | Status: AC

## 2020-03-26 MED ORDER — AZELASTINE-FLUTICASONE 137-50 MCG/ACT NA SUSP: NASAL | 3 refills | Status: DC

## 2020-03-26 MED ORDER — PSEUDOEPHEDRINE-GUAIFENESIN ER 60-600 MG PO TB12
1.0000 | ORAL_TABLET | Freq: Two times a day (BID) | ORAL | 1 refills | Status: AC
Start: 2020-03-26 — End: 2020-03-31

## 2020-03-26 NOTE — Telephone Encounter (Signed)
Requested medication (s) are due for refill today: na  Requested medication (s) are on the active medication list: yes  Last refill:  03/26/20 #23 g 3 refills  Future visit scheduled: no was today  Notes to clinic:  pharmacy : insurance wants alternative     Requested Prescriptions  Pending Prescriptions Disp Refills   Azelastine-Fluticasone 137-50 MCG/ACT SUSP [Pharmacy Med Name: AZELASTIN-FLUTIC 137-50MCG SPR]  3    Sig: USE 1 SPRAY IN EACH NOSTRIL TWICE A DAY      Ear, Nose, and Throat: Nasal Preparations - Corticosteroids Passed - 03/26/2020  5:23 PM      Passed - Valid encounter within last 12 months    Recent Outpatient Visits           Today Acute UTI   Primary Care at [facility], Eilleen Kempf, MD   2 months ago Acute UTI   Primary Care at [facility], Eilleen Kempf, MD   2 months ago Acute UTI   Primary Care at [facility], [place], MD   2 months ago Chronic pain of left lower extremity   Primary Care at [facility], Eilleen Kempf, MD   3 months ago Acute UTI   Primary Care at [facility], Eilleen Kempf, MD

## 2020-03-26 NOTE — Patient Instructions (Addendum)
If you have lab work done today you will be contacted with your lab results within the next 2 weeks.  If you have not heard from [place] then please contact us. The fastest way to get your results is to register for My Chart.   IF you received an x-ray today, you will receive an invoice from [facility]. Please contact [facility] at [phone] with questions or concerns regarding your invoice.   IF you received labwork today, you will receive an invoice from [place]. Please contact [facility] at [phone] with questions or concerns regarding your invoice.   Our billing staff will not be able to assist you with questions regarding bills from these companies.  You will be contacted with the lab results as soon as they are available. The fastest way to get your results is to activate your My Chart account. Instructions are located on the last page of this paperwork. If you have not heard from [place] regarding the results in 2 weeks, please contact this office.      Urinary Tract Infection, Adult A urinary tract infection (UTI) is an infection of any part of the urinary tract. The urinary tract includes:  The kidneys.  The ureters.  The bladder.  The urethra. These organs make, store, and get rid of pee (urine) in the body. What are the causes? This infection is caused by germs (bacteria) in your genital area. These germs grow and cause swelling (inflammation) of your urinary tract. What increases the risk? The following factors may make you more likely to develop this condition:  Using a small, thin tube (catheter) to drain pee.  Not being able to control when you pee or poop (incontinence).  Being female. If you are female, these things can increase the risk: ? Using these methods to prevent pregnancy:  A medicine that kills sperm (spermicide).  A device that blocks sperm (diaphragm). ? Having low levels of a female hormone (estrogen). ? Being  pregnant. You are more likely to develop this condition if:  You have genes that add to your risk.  You are sexually active.  You take antibiotic medicines.  You have trouble peeing because of: ? A prostate that is bigger than normal, if you are female. ? A blockage in the part of your body that drains pee from the bladder. ? A kidney stone. ? A nerve condition that affects your bladder. ? Not getting enough to drink. ? Not peeing often enough.  You have other conditions, such as: ? Diabetes. ? A weak disease-fighting system (immune system). ? Sickle cell disease. ? Gout. ? Injury of the spine. What are the signs or symptoms? Symptoms of this condition include:  Needing to pee right away.  Peeing small amounts often.  Pain or burning when peeing.  Blood in the pee.  Pee that smells bad or not like normal.  Trouble peeing.  Pee that is cloudy.  Fluid coming from the vagina, if you are female.  Pain in the belly or lower back. Other symptoms include:  Vomiting.  Not feeling hungry.  Feeling mixed up (confused). This may be the first symptom in older adults.  Being tired and grouchy (irritable).  A fever.  Watery poop (diarrhea). How is this treated?  Taking antibiotic medicine.  Taking other medicines.  Drinking enough water. In some cases, you may need to see a specialist. Follow these instructions at home: Medicines  Take over-the-counter and prescription medicines only as told by  your doctor.  If you were prescribed an antibiotic medicine, take it as told by your doctor. Do not stop taking it even if you start to feel better. General instructions  Make sure you: ? Pee until your bladder is empty. ? Do not hold pee for a long time. ? Empty your bladder after sex. ? Wipe from front to back after peeing or pooping if you are a female. Use each tissue one time when you wipe.  Drink enough fluid to keep your pee pale yellow.  Keep all  follow-up visits.   Contact a doctor if:  You do not get better after 1-2 days.  Your symptoms go away and then come back. Get help right away if:  You have very bad back pain.  You have very bad pain in your lower belly.  You have a fever.  You have chills.  You feeling like you will vomit or you vomit. Summary  A urinary tract infection (UTI) is an infection of any part of the urinary tract.  This condition is caused by germs in your genital area.  There are many risk factors for a UTI.  Treatment includes antibiotic medicines.  Drink enough fluid to keep your pee pale yellow. This information is not intended to replace advice given to you by your health care provider. Make sure you discuss any questions you have with your health care provider. Document Revised: 08/25/2019 Document Reviewed: 08/25/2019 Elsevier Patient Education  2021 [employer].

## 2020-03-26 NOTE — Progress Notes (Signed)
Jasmine Long 70 y.o.   Chief Complaint  Patient presents with  . Urinary Tract Infection    Frequency x5   . Sinusitis    Drainage and mucus x 3 days    HISTORY OF PRESENT ILLNESS: [name] complaining of possible UTI with urinary frequency and burning that started 5 days ago. Last urine culture showed Pseudomonas aeruginosa sensitive to all antibiotics tested. Patient also has history of chronic sinusitis.  Chronic smoker.  Has chronic sinus drainage. Denies fever or chills.  Denies flank pain.  Denies nausea or vomiting.  Denies abdominal or pelvic pain. No other complaints or medical concerns today.  HPI   Prior to Admission medications   Medication Sig Start Date End Date Taking? Authorizing Provider  ALPRAZolam Duanne Moron) 0.5 MG tablet TAKE 1 TABLET BY MOUTH EVERY DAY AS NEEDED FOR ANXIETY 12/05/19   Horald Pollen, MD  atorvastatin (LIPITOR) 20 MG tablet TAKE 1 TABLET BY MOUTH EVERY DAY 06/03/19   Horald Pollen, MD  buPROPion (WELLBUTRIN XL) 300 MG 24 hr tablet TAKE 1 TABLET BY MOUTH EVERY DAY 03/04/20   Horald Pollen, MD  levothyroxine (SYNTHROID) 125 MCG tablet TAKE 1 TABLET (125 MCG TOTAL) BY MOUTH DAILY BEFORE BREAKFAST. 10/17/19   Horald Pollen, MD  losartan (COZAAR) 100 MG tablet TAKE 1 TABLET BY MOUTH EVERY DAY 01/11/20   Horald Pollen, MD  metFORMIN (GLUCOPHAGE) 1000 MG tablet TAKE 1 TABLET BY MOUTH TWICE A DAY WITH MEALS 03/02/20   Horald Pollen, MD  metoprolol succinate (TOPROL-XL) 25 MG 24 hr tablet TAKE 1 TABLET BY MOUTH EVERY DAY 06/03/19   Horald Pollen, MD  nicotine (NICODERM CQ - DOSED IN MG/24 HOURS) 21 mg/24hr patch Place 1 patch (21 mg total) onto the skin daily. 04/20/18   Horald Pollen, MD    No Known Allergies  Patient Active Problem List   Diagnosis Date Noted  . Chronic anxiety 06/28/2019  . History of peptic ulcer 04/30/2017  . Hyperlipidemia 12/11/2015  . Insomnia 12/11/2015  .  Panic attack as reaction to stress 12/11/2015  . Current smoker 12/11/2015  . Chronic sinusitis 12/11/2015  . Dyslipidemia associated with type 2 diabetes mellitus ([place]) 05/15/2015  . Hypothyroid 04/29/2011  . BMI 40.0-44.9, adult ([place]) 04/29/2011  . Depression with anxiety 04/29/2011  . GERD (gastroesophageal reflux disease) 04/29/2011  . 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  . Nicotine addiction 04/29/2011  . Hypertension associated with diabetes ([place]) 04/29/2011  . Murmur, cardiac 04/29/2011    Past Medical History:  Diagnosis Date  . Allergy   . Anxiety   . Depression   . Heart murmur   . Hypertension   . Thyroid disease   . Ulcer     Past Surgical History:  Procedure Laterality Date  . BREAST SURGERY    . CESAREAN SECTION    . CHOLECYSTECTOMY    . NASAL SEPTUM SURGERY      Social History   Socioeconomic History  . Marital status: Divorced    Spouse name: Not on file  . Number of children: Not on file  . Years of education: Not on file  . Highest education level: Not on file  Occupational History  . Occupation: Unemployed  Tobacco Use  . Smoking status: Current Every Day Smoker    Packs/day: 1.50    Years: 40.00    Pack years: 60.00    Types: Cigarettes    Last attempt to quit:  01/20/2012    Years since quitting: 8.1  . Smokeless tobacco: Never Used  . Tobacco comment: Pt doing E-cigarette as of 01/20/12  Vaping Use  . Vaping Use: Former  Substance and Sexual Activity  . Alcohol use: No  . Drug use: No  . Sexual activity: Never  Other Topics Concern  . Not on file  Social History Narrative   Patient on worker's compensation for an injury sustained on the job 5 years ago.   Social Determinants of Health   Financial Resource Strain: Not on file  Food Insecurity: Not on file  Transportation Needs: Not on file  Physical Activity: Not on file  Stress: Not on file  Social Connections: Not on file  Intimate Partner Violence: Not on file    Family  History  Problem Relation Age of Onset  . Lung cancer Mother   . Dementia Father   . Atrial fibrillation Father      Review of Systems  Constitutional: Negative.  Negative for chills and fever.  HENT: Negative.  Negative for congestion and sore throat.   Respiratory: Negative.  Negative for cough and shortness of breath.   Cardiovascular: Negative.  Negative for chest pain and palpitations.  Gastrointestinal: Negative.  Negative for abdominal pain, blood in stool, diarrhea, melena, nausea and vomiting.  Genitourinary: Positive for dysuria, frequency and urgency. Negative for flank pain.  Musculoskeletal: Negative.  Negative for back pain, myalgias and neck pain.  Skin: Negative.  Negative for rash.  Neurological: Negative for dizziness and headaches.  All other systems reviewed and are negative.   Today's Vitals   03/26/20 1605  BP: 140/80  Pulse: 92  Temp: 97.9 F (36.6 C)  TempSrc: Temporal  SpO2: 98%  Weight: 244 lb 3.2 oz (110.8 kg)  Height: $Remove'5\' 9"'nbNmeyJ$  (1.753 m)   Body mass index is 36.06 kg/m.  Physical Exam Vitals reviewed.  Constitutional:      Appearance: Normal appearance.  HENT:     Head: Normocephalic.  Eyes:     Extraocular Movements: Extraocular movements intact.     Conjunctiva/sclera: Conjunctivae normal.     Pupils: Pupils are equal, round, and reactive to light.  Cardiovascular:     Rate and Rhythm: Normal rate and regular rhythm.     Pulses: Normal pulses.     Heart sounds: Normal heart sounds.  Pulmonary:     Effort: Pulmonary effort is normal.     Breath sounds: Normal breath sounds.  Abdominal:     General: Bowel sounds are normal. There is no distension.     Palpations: Abdomen is soft.     Tenderness: There is no abdominal tenderness.  Musculoskeletal:        General: Normal range of motion.     Cervical back: Normal range of motion and neck supple.  Skin:    General: Skin is warm and dry.  Neurological:     General: No focal deficit  present.     Mental Status: She is alert and oriented to person, place, and time.  Psychiatric:        Mood and Affect: Mood normal.        Behavior: Behavior normal.    Results for orders placed or performed in visit on 03/26/20 (from the past 24 hour(s))  POCT urinalysis dipstick     Status: Abnormal   Collection Time: 03/26/20  4:20 PM  Result Value Ref Range   Color, UA yellow yellow   Clarity, UA cloudy (A) clear  Glucose, UA negative negative mg/dL   Bilirubin, UA negative negative   Ketones, POC UA negative negative mg/dL   Spec Grav, UA 1.015 1.010 - 1.025   Blood, UA moderate (A) negative   pH, UA 6.0 5.0 - 8.0   Protein Ur, POC trace (A) negative mg/dL   Urobilinogen, UA 0.2 0.2 or 1.0 E.U./dL   Nitrite, UA Positive (A) Negative   Leukocytes, UA Large (3+) (A) Negative  POCT Microscopic Urinalysis ([facility])     Status: Abnormal   Collection Time: 03/26/20  4:28 PM  Result Value Ref Range   WBC,UR,HPF,POC Too numerous to count  (A) None WBC/hpf   RBC,UR,HPF,POC Many (A) None RBC/hpf   Bacteria Moderate (A) None, Too numerous to count   Mucus Absent Absent   Epithelial Cells, UR Per Microscopy None None, Too numerous to count cells/hpf  POCT glucose (manual entry)     Status: None   Collection Time: 03/26/20  4:52 PM  Result Value Ref Range   POC Glucose 99 70 - 99 mg/dl  POCT glycosylated hemoglobin (Hb A1C)     Status: Abnormal   Collection Time: 03/26/20  5:19 PM  Result Value Ref Range   Hemoglobin A1C 6.3 (A) 4.0 - 5.6 %   HbA1c POC (<> result, manual entry)     HbA1c, POC (prediabetic range)     HbA1c, POC (controlled diabetic range)       ASSESSMENT & PLAN: Aadvika was seen today for urinary tract infection and sinusitis.  Diagnoses and all orders for this visit:  Acute UTI -     Urine Culture -     ciprofloxacin (CIPRO) 500 MG tablet; Take 1 tablet (500 mg total) by mouth 2 (two) times daily for 7 days.  Frequency of urination -     POCT Microscopic  Urinalysis ([facility]) -     POCT urinalysis dipstick  Hypertension associated with diabetes (HCC) -     CMP14+EGFR -     POCT glucose (manual entry) -     POCT glycosylated hemoglobin (Hb A1C)  Sinus congestion -     pseudoephedrine-guaifenesin (MUCINEX D) 60-600 MG 12 hr tablet; Take 1 tablet by mouth every 12 (twelve) hours for 5 days. -     Azelastine-Fluticasone (DYMISTA) 137-50 MCG/ACT SUSP; Take 1 spray in each nostril twice a day  Hypothyroidism, unspecified type -     TSH  Chronic maxillary sinusitis    Patient Instructions       If you have lab work done today you will be contacted with your lab results within the next 2 weeks.  If you have not heard from [place] then please contact us. The fastest way to get your results is to register for My Chart.   IF you received an x-ray today, you will receive an invoice from [facility]. Please contact [facility] at [phone] with questions or concerns regarding your invoice.   IF you received labwork today, you will receive an invoice from [place]. Please contact [facility] at [phone] with questions or concerns regarding your invoice.   Our billing staff will not be able to assist you with questions regarding bills from these companies.  You will be contacted with the lab results as soon as they are available. The fastest way to get your results is to activate your My Chart account. Instructions are located on the last page of this paperwork. If you have not heard from [place] regarding the results in 2 weeks, please contact  this office.      Urinary Tract Infection, Adult A urinary tract infection (UTI) is an infection of any part of the urinary tract. The urinary tract includes:  The kidneys.  The ureters.  The bladder.  The urethra. These organs make, store, and get rid of pee (urine) in the body. What are the causes? This infection is caused by germs (bacteria) in your genital area. These germs grow  and cause swelling (inflammation) of your urinary tract. What increases the risk? The following factors may make you more likely to develop this condition:  Using a small, thin tube (catheter) to drain pee.  Not being able to control when you pee or poop (incontinence).  Being female. If you are female, these things can increase the risk: ? Using these methods to prevent pregnancy:  A medicine that kills sperm (spermicide).  A device that blocks sperm (diaphragm). ? Having low levels of a female hormone (estrogen). ? Being pregnant. You are more likely to develop this condition if:  You have genes that add to your risk.  You are sexually active.  You take antibiotic medicines.  You have trouble peeing because of: ? A prostate that is bigger than normal, if you are female. ? A blockage in the part of your body that drains pee from the bladder. ? A kidney stone. ? A nerve condition that affects your bladder. ? Not getting enough to drink. ? Not peeing often enough.  You have other conditions, such as: ? Diabetes. ? A weak disease-fighting system (immune system). ? Sickle cell disease. ? Gout. ? Injury of the spine. What are the signs or symptoms? Symptoms of this condition include:  Needing to pee right away.  Peeing small amounts often.  Pain or burning when peeing.  Blood in the pee.  Pee that smells bad or not like normal.  Trouble peeing.  Pee that is cloudy.  Fluid coming from the vagina, if you are female.  Pain in the belly or lower back. Other symptoms include:  Vomiting.  Not feeling hungry.  Feeling mixed up (confused). This may be the first symptom in older adults.  Being tired and grouchy (irritable).  A fever.  Watery poop (diarrhea). How is this treated?  Taking antibiotic medicine.  Taking other medicines.  Drinking enough water. In some cases, you may need to see a specialist. Follow these instructions at  home: Medicines  Take over-the-counter and prescription medicines only as told by your doctor.  If you were prescribed an antibiotic medicine, take it as told by your doctor. Do not stop taking it even if you start to feel better. General instructions  Make sure you: ? Pee until your bladder is empty. ? Do not hold pee for a long time. ? Empty your bladder after sex. ? Wipe from front to back after peeing or pooping if you are a female. Use each tissue one time when you wipe.  Drink enough fluid to keep your pee pale yellow.  Keep all follow-up visits.   Contact a doctor if:  You do not get better after 1-2 days.  Your symptoms go away and then come back. Get help right away if:  You have very bad back pain.  You have very bad pain in your lower belly.  You have a fever.  You have chills.  You feeling like you will vomit or you vomit. Summary  A urinary tract infection (UTI) is an infection of any part of the  urinary tract.  This condition is caused by germs in your genital area.  There are many risk factors for a UTI.  Treatment includes antibiotic medicines.  Drink enough fluid to keep your pee pale yellow. This information is not intended to replace advice given to you by your health care provider. Make sure you discuss any questions you have with your health care provider. Document Revised: 08/25/2019 Document Reviewed: 08/25/2019 Elsevier Patient Education  2021 Elsevier Inc[employer]      Agustina Caroli, MD [facility]

## 2020-03-27 ENCOUNTER — Telehealth: Payer: Self-pay

## 2020-03-27 ENCOUNTER — Other Ambulatory Visit: Payer: Self-pay | Admitting: Emergency Medicine

## 2020-03-27 DIAGNOSIS — R0981 Nasal congestion: Secondary | ICD-10-CM

## 2020-03-27 LAB — CMP14+EGFR
ALT: 11 IU/L (ref 0–32)
AST: 11 IU/L (ref 0–40)
Albumin/Globulin Ratio: 1.7 (ref 1.2–2.2)
Albumin: 4.5 g/dL (ref 3.8–4.8)
Alkaline Phosphatase: 117 IU/L (ref 44–121)
BUN/Creatinine Ratio: 16 (ref 12–28)
BUN: 15 mg/dL (ref 8–27)
Bilirubin Total: 0.5 mg/dL (ref 0.0–1.2)
CO2: 18 mmol/L — ABNORMAL LOW (ref 20–29)
Calcium: 9.8 mg/dL (ref 8.7–10.3)
Chloride: 106 mmol/L (ref 96–106)
Creatinine, Ser: 0.95 mg/dL (ref 0.57–1.00)
Globulin, Total: 2.6 g/dL (ref 1.5–4.5)
Glucose: 97 mg/dL (ref 65–99)
Potassium: 4.8 mmol/L (ref 3.5–5.2)
Sodium: 143 mmol/L (ref 134–144)
Total Protein: 7.1 g/dL (ref 6.0–8.5)
eGFR: 66 mL/min/{1.73_m2} (ref >59)

## 2020-03-27 LAB — TSH: TSH: 0.529 u[IU]/mL (ref 0.450–4.500)

## 2020-03-27 NOTE — Telephone Encounter (Signed)
Requested medication (s) are due for refill today:   Ordered this morning by Dr. Alvy Bimler  Requested medication (s) are on the active medication list:   Pharmacy requesting an alternative  Need PA  Future visit scheduled:   No   Last ordered: Today 03/27/2020  Clinic note:   Returned because pharmacy requesting an alternative:  Need PA   Requested Prescriptions  Pending Prescriptions Disp Refills   flunisolide (NASALIDE) 25 MCG/ACT (0.025%) SOLN [Pharmacy Med Name: FLUNISOLIDE 0.025% SPRAY]  0      Ear, Nose, and Throat: Nasal Preparations - Corticosteroids Passed - 03/27/2020  7:40 AM      Passed - Valid encounter within last 12 months    Recent Outpatient Visits           Yesterday Acute UTI   Primary Care at [facility], Eilleen Kempf, MD   2 months ago Acute UTI   Primary Care at [facility], Eilleen Kempf, MD   2 months ago Acute UTI   Primary Care at [place], Eilleen Kempf, MD   2 months ago Chronic pain of left lower extremity   Primary Care at [facility], Eilleen Kempf, MD   3 months ago Acute UTI   Primary Care at [facility], Eilleen Kempf, MD

## 2020-03-27 NOTE — Telephone Encounter (Signed)
I did prescribe Mucinex D which is an oral medication.  She did not tell me she cannot use nasal sprays but it is okay if she does not want to use it.  There is nothing else I can prescribe.  Thanks.

## 2020-03-27 NOTE — Telephone Encounter (Signed)
Dr Alvy Bimler I know you stated earlier that's you addressed this medication at patient office visit but patient is requesting spray instead of nasal spray

## 2020-03-27 NOTE — Telephone Encounter (Signed)
Pharmacy need one of the below alternate rx  All Pharmacy Suggested Alternatives:   flunisolide (NASALIDE) 25 MCG/ACT (0.025%) SOLN fluticasone (FLONASE) 50 MCG/ACT nasal spray ipratropium (ATROVENT) 0.06 % nasal spray

## 2020-03-27 NOTE — Telephone Encounter (Signed)
I noticed the Nasalide Rx was refused because pt needs an appt.   She was seen by Dr. Terence Lux on 03/26/2020 yesterday.  Pharmacy requesting an alternative or PA for this medication.

## 2020-03-27 NOTE — Telephone Encounter (Signed)
I prescribed different nasal spray when I saw her yesterday, Dymista.  Thanks.

## 2020-03-27 NOTE — Telephone Encounter (Signed)
Patient was informed.

## 2020-03-27 NOTE — Telephone Encounter (Signed)
Pt. Called requesting the practice replace Azelastine-Fluticasone with non-nose spray alternative. Pt. Reports she is unable to use nose sprays

## 2020-03-29 ENCOUNTER — Other Ambulatory Visit: Payer: Self-pay | Admitting: Emergency Medicine

## 2020-03-29 DIAGNOSIS — N390 Urinary tract infection, site not specified: Secondary | ICD-10-CM

## 2020-03-29 LAB — URINE CULTURE

## 2020-03-29 MED ORDER — CEFUROXIME AXETIL 500 MG PO TABS: 500.0000 mg | ORAL_TABLET | Freq: Two times a day (BID) | ORAL | 0 refills | Status: AC

## 2020-04-02 ENCOUNTER — Other Ambulatory Visit: Payer: Self-pay | Admitting: *Deleted

## 2020-04-02 ENCOUNTER — Telehealth: Payer: Self-pay | Admitting: Emergency Medicine

## 2020-04-02 DIAGNOSIS — J320 Chronic maxillary sinusitis: Secondary | ICD-10-CM

## 2020-04-02 NOTE — Telephone Encounter (Signed)
Patient just missed a call from our office

## 2020-04-02 NOTE — Telephone Encounter (Signed)
Patient is to calling to report she is currently taking cefUROXime (CEFTIN) 500 MG tablet [[id number] And she received a phone  telling her her her RX was for pick up her  ciprofloxacin (CIPRO) 500 MG tablet [[id number]     Patient is a little confused should she be taking  should be taking them both ?    Please advise Pharmacy is holding the Rx for patient

## 2020-04-02 NOTE — Telephone Encounter (Signed)
Spoke with patient confirmed medications.  Patient voiced understanding

## 2020-04-16 ENCOUNTER — Other Ambulatory Visit: Payer: Self-pay | Admitting: Emergency Medicine

## 2020-04-16 DIAGNOSIS — I10 Essential (primary) hypertension: Secondary | ICD-10-CM

## 2020-04-16 NOTE — Telephone Encounter (Signed)
  Notes to clinic:  medication last filled 11/01/2019 Review for continued use and refill    Requested Prescriptions  Pending Prescriptions Disp Refills   metoprolol succinate (TOPROL-XL) 25 MG 24 hr tablet [Pharmacy Med Name: METOPROLOL SUCC ER 25 MG TAB] 90 tablet 1    Sig: TAKE 1 TABLET BY MOUTH EVERY DAY      Cardiovascular:  Beta Blockers Failed - 04/16/2020  2:07 PM      Failed - Last BP in normal range    BP Readings from Last 1 Encounters:  03/26/20 140/80          Passed - Last Heart Rate in normal range    Pulse Readings from Last 1 Encounters:  03/26/20 92          Passed - Valid encounter within last 6 months    Recent Outpatient Visits           3 weeks ago Acute UTI   Primary Care at [facility], Eilleen Kempf, MD   2 months ago Acute UTI   Primary Care at [place], Eilleen Kempf, MD   2 months ago Acute UTI   Primary Care at [place], [place], MD   3 months ago Chronic pain of left lower extremity   Primary Care at [facility], Eilleen Kempf, MD   4 months ago Acute UTI   Primary Care at [facility], Eilleen Kempf, MD       Future Appointments             In 1 week Drema Halon, MD [facility]

## 2020-04-23 ENCOUNTER — Ambulatory Visit (INDEPENDENT_AMBULATORY_CARE_PROVIDER_SITE_OTHER): Payer: Medicare HMO | Admitting: Otolaryngology

## 2020-04-29 ENCOUNTER — Ambulatory Visit: Payer: Medicare HMO | Admitting: Emergency Medicine

## 2020-04-29 DIAGNOSIS — B999 Unspecified infectious disease: Secondary | ICD-10-CM

## 2020-04-29 DIAGNOSIS — E1169 Type 2 diabetes mellitus with other specified complication: Secondary | ICD-10-CM

## 2020-04-29 DIAGNOSIS — N390 Urinary tract infection, site not specified: Secondary | ICD-10-CM

## 2020-04-29 DIAGNOSIS — F17200 Nicotine dependence, unspecified, uncomplicated: Secondary | ICD-10-CM

## 2020-04-29 DIAGNOSIS — I152 Hypertension secondary to endocrine disorders: Secondary | ICD-10-CM

## 2020-04-29 DIAGNOSIS — J329 Chronic sinusitis, unspecified: Secondary | ICD-10-CM

## 2020-05-02 ENCOUNTER — Ambulatory Visit (INDEPENDENT_AMBULATORY_CARE_PROVIDER_SITE_OTHER): Payer: Medicare HMO | Admitting: Emergency Medicine

## 2020-05-02 ENCOUNTER — Other Ambulatory Visit: Payer: Self-pay

## 2020-05-02 ENCOUNTER — Encounter: Payer: Self-pay | Admitting: Emergency Medicine

## 2020-05-02 VITALS — BP 168/70 | HR 87 | Temp 98.3°F | Ht 69.0 in | Wt 243.0 lb

## 2020-05-02 DIAGNOSIS — Z8709 Personal history of other diseases of the respiratory system: Secondary | ICD-10-CM | POA: Diagnosis not present

## 2020-05-02 DIAGNOSIS — Z8619 Personal history of other infectious and parasitic diseases: Secondary | ICD-10-CM

## 2020-05-02 DIAGNOSIS — R350 Frequency of micturition: Secondary | ICD-10-CM

## 2020-05-02 DIAGNOSIS — F4310 Post-traumatic stress disorder, unspecified: Secondary | ICD-10-CM | POA: Diagnosis not present

## 2020-05-02 LAB — POCT URINALYSIS DIP (MANUAL ENTRY)
Bilirubin, UA: NEGATIVE
Blood, UA: NEGATIVE
Glucose, UA: NEGATIVE mg/dL
Ketones, POC UA: NEGATIVE mg/dL
Leukocytes, UA: NEGATIVE
Nitrite, UA: NEGATIVE
Protein Ur, POC: NEGATIVE mg/dL
Spec Grav, UA: 1.015 (ref 1.010–1.025)
Urobilinogen, UA: 0.2 E.U./dL
pH, UA: 6 (ref 5.0–8.0)

## 2020-05-02 NOTE — Patient Instructions (Signed)
Managing Post-Traumatic Stress Disorder If you have been diagnosed with post-traumatic stress disorder (PTSD), you may be relieved that you now know why you have felt or behaved a certain way. Still, you may feel overwhelmed about the treatment ahead. You may also wonder how to get the support you need and how to deal with the condition day-to-day. If you are living with PTSD, there are ways to help you recover from it and manage your symptoms. How to manage lifestyle changes Managing stress Stress is your body's reaction to life changes and events, both good and bad. Stress can make PTSD worse. Take the following steps to manage stress:  Talk with your health care provider or a counselor if you would like to learn more about techniques to reduce your stress. He or she may suggest some stress reduction techniques such as: ? Muscle relaxation exercises. ? Regular exercise. ? Meditation, yoga, or other mind-body exercises. ? Breathing exercises. ? Listening to quiet music. ? Spending time outside.  Maintain a healthy lifestyle. Eat a healthy diet, exercise regularly, get plenty of sleep, and take time to relax.  Spend time with others. Talk with them about how you are feeling and what kind of support you need. Try not to isolate yourself, even though you may feel like doing that. Isolating yourself can delay your recovery.  Do activities and hobbies that you enjoy.  Pace yourself when doing stressful things. Take breaks, and reward yourself when you finish. Make sure that you do not overload your schedule.   Medicines Your health care provider may suggest certain medicines if he or she feels that they will help to improve your condition. Medicines for depression (antidepressants) or severe loss of contact with reality (antipsychotics) may be used to treat PTSD. Avoid using alcohol and other substances that may prevent your medicines from working properly. It is also important to:  Talk with  your pharmacist or health care provider about all medicines that you take, their possible side effects, and which medicines are safe to take together.  Make it your goal to take part in all treatment decisions (shared decision-making). Ask about possible side effects of medicines that your health care provider recommends, and tell him or her how you feel about having those side effects. It is best if shared decision-making with your health care provider is part of your total treatment plan. If your health care provider prescribes a medicine, you may not notice the full benefits of it for 4-8 weeks. Most people who are treated for PTSD need to take medicine for at least 6-12 months before they feel better. If you are taking medicines as part of your treatment, do not stop taking medicines before you ask your health care provider if it is safe to stop. You may need to have the medicine slowly decreased (tapered) over time to lower the risk of harmful side effects. Relationships Many people who have PTSD have difficulty trusting others. Make an effort to:  Take risks and develop trust with close friends and family members. Developing trust in others can help you feel safe and connect you with emotional support.  Be open and honest about your feelings.  Have fun and relax in safe spaces, such as with friends and family.  Think about going to couples counseling, family education classes, or family therapy. Your loved ones may not always know how to be supportive. Therapy can be helpful for everyone. How to recognize changes in your condition Be aware of   your symptoms and how often you have them. The following symptoms mean that you need to seek help for your PTSD:  You feel suspicious and angry.  You have repeated flashbacks.  You avoid going out or being with others.  You have an increasing number of fights with close friends or family members, such as your spouse.  You have thoughts about  hurting yourself or others.  You cannot get relief from feelings of depression or anxiety. Follow these instructions at home: Lifestyle  Exercise regularly. Try to do 30 or more minutes of physical activity on most days of the week.  Try to get 7-9 hours of sleep each night. To help with sleep: ? Keep your bedroom cool and dark. ? Avoid screen time before bedtime. This means avoiding use of your TV, computer, tablet, and cell phone.  Practice self-soothing skills and use them daily.  Try to have fun and seek humor in your life. Eating and drinking  Do not eat a heavy meal during the hour before you go to bed.  Do not drink alcohol or caffeinated drinks before bed.  Avoid using alcohol or drugs. General instructions  If your PTSD is affecting your marriage or family, seek help from a family therapist.  Remind yourself that recovering from the trauma is a process and takes time.  Take over-the-counter and prescription medicines only as told by your health care provider.  Make sure to let all of your health care providers know that you have PTSD. This is especially important if you are having surgery or need to be admitted to the hospital.  Keep all follow-up visits as told by your health care providers. This is important. Where to find support Talking to others  Explain that PTSD is a mental health problem. It is something that a person can develop after experiencing or seeing a life-threatening event. Tell them that PTSD makes you feel stress like you did during the event.  Talk to your loved ones about the symptoms you have. Also tell them what things or situations can cause symptoms to start (are triggers for you).  Assure your loved ones that there are treatments to help PTSD. Discuss possibly seeking family therapy or couples therapy.  If you are worried or fearful about seeking treatment, ask for support.  Keep daily contact with at least one trusted friend or family  member. Finances Not all insurance plans cover mental health care, so it is important to check with your insurance carrier. If paying for co-pays or counseling services is a problem, search for a local or county mental health care center. Public mental health care services may be offered there at a low cost or no cost when you are not able to see a private health care provider. If you are a veteran, contact a local veterans organization or veterans hospital for more information. If you are taking medicine for PTSD, you may be able to get the genericform, which may be less expensive than brand-name medicine. Some makers of prescription medicines also offer help to patients who cannot afford the medicines that they need. Therapy and support groups  Find a support group in your community. Often, groups are available for military veterans, trauma victims, and family members or caregivers.  Look into volunteer opportunities. Taking part in these can help you feel more connected to your community.  Contact a local organization to find out if you are eligible for a service dog. Where to find more information Go   to this website to find more information about PTSD, treatment of PTSD, and how to get support:  National [facility] for PTSD: www.ptsd.va.gov Contact a health care provider if:  Your symptoms get worse or do not get better. Get help right away if:  You have thoughts about hurting yourself or others. If you ever feel like you may hurt yourself or others, or have thoughts about taking your own life, get help right away. You can go to your nearest emergency department or call:  Your local emergency services (911 in the [place]  A suicide crisis helpline, such as the National Suicide Prevention Lifeline at 1-800-273-8255. This is open 24-hours a day. Summary  If you are living with PTSD, there are ways to help you recover from it and manage your symptoms.  Find supportive environments and  people who understand PTSD. Spend time in those places, and maintain contact with those people.  Work with your health care team to create a plan for managing PTSD. The plan should include counseling, stress reduction techniques, and healthy lifestyle habits. This information is not intended to replace advice given to you by your health care provider. Make sure you discuss any questions you have with your health care provider. Document Revised: 09/29/2019 Document Reviewed: 09/29/2019 Elsevier Patient Education  2021 Elsevier Inc.  

## 2020-05-02 NOTE — Progress Notes (Signed)
Jasmine Long 70 y.o.   Chief Complaint  Patient presents with  . Recurrent UTI    Per patient about 2 weeks with some frequency    HISTORY OF PRESENT ILLNESS: [name] with several complaints today. #1 intermittent urinary frequency.  Has history of recurrent UTIs.  Concerned about another infection. #2 PTSD related to son's death couple years ago.  Needs referral to behavioral health. Presently taking Wellbutrin XL 300 mg daily. #3 hypertension on losartan 100 mg daily and metoprolol succinate 25 mg daily. #4 chronic smoker #5 diabetes on Metformin 1000 mg twice a day #6 chronic sinusitis and history of chronic infections.  Has never been tested for IgA or IgG deficiency syndromes. No other complaints or medical concerns today.  HPI   Prior to Admission medications   Medication Sig Start Date End Date Taking? Authorizing Provider  ALPRAZolam Prudy Feeler) 0.5 MG tablet TAKE 1 TABLET BY MOUTH EVERY DAY AS NEEDED FOR ANXIETY 12/05/19  Yes Leanna Hamid, Eilleen Kempf, MD  atorvastatin (LIPITOR) 20 MG tablet TAKE 1 TABLET BY MOUTH EVERY DAY 06/03/19  Yes [name], MD  buPROPion (WELLBUTRIN XL) 300 MG 24 hr tablet TAKE 1 TABLET BY MOUTH EVERY DAY 03/04/20  Yes [name], MD  levothyroxine (SYNTHROID) 125 MCG tablet TAKE 1 TABLET (125 MCG TOTAL) BY MOUTH DAILY BEFORE BREAKFAST. 10/17/19  Yes Cortavious Nix, Eilleen Kempf, MD  losartan (COZAAR) 100 MG tablet TAKE 1 TABLET BY MOUTH EVERY DAY 01/11/20  Yes [name], MD  metFORMIN (GLUCOPHAGE) 1000 MG tablet TAKE 1 TABLET BY MOUTH TWICE A DAY WITH MEALS 03/02/20  Yes Britne Borelli, Eilleen Kempf, MD  metoprolol succinate (TOPROL-XL) 25 MG 24 hr tablet TAKE 1 TABLET BY MOUTH EVERY DAY 04/16/20  Yes Dezaree Tracey, Eilleen Kempf, MD  nicotine (NICODERM CQ - DOSED IN MG/24 HOURS) 21 mg/24hr patch Place 1 patch (21 mg total) onto the skin daily. 04/20/18  Yes [name], MD  Azelastine-Fluticasone [phone] MCG/ACT SUSP USE 1  SPRAY IN EACH NOSTRIL TWICE A DAY Patient not taking: Reported on 05/02/2020 03/27/20   [name], MD    Not on File  Patient Active Problem List   Diagnosis Date Noted  . Chronic anxiety 06/28/2019  . History of peptic ulcer 04/30/2017  . Hyperlipidemia 12/11/2015  . Insomnia 12/11/2015  . Panic attack as reaction to stress 12/11/2015  . Current smoker 12/11/2015  . Chronic sinusitis 12/11/2015  . Dyslipidemia associated with type 2 diabetes mellitus (HCC) 05/15/2015  . Hypothyroid 04/29/2011  . BMI 40.0-44.9, adult (HCC) 04/29/2011  . Depression with anxiety 04/29/2011  . GERD (gastroesophageal reflux disease) 04/29/2011  . 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  . Nicotine addiction 04/29/2011  . Hypertension associated with diabetes (HCC) 04/29/2011  . Murmur, cardiac 04/29/2011    Past Medical History:  Diagnosis Date  . Allergy   . Anxiety   . Depression   . Heart murmur   . Hypertension   . Thyroid disease   . Ulcer     Past Surgical History:  Procedure Laterality Date  . BREAST SURGERY    . CESAREAN SECTION    . CHOLECYSTECTOMY    . NASAL SEPTUM SURGERY      Social History   Socioeconomic History  . Marital status: Divorced    Spouse name: Not on file  . Number of children: Not on file  . Years of education: Not on file  . Highest education level: Not on file  Occupational History  . Occupation: Unemployed  Tobacco Use  . Smoking status: Current Every Day Smoker    Packs/day: 1.50    Years: 40.00    Pack years: 60.00    Types: Cigarettes    Last attempt to quit: 01/20/2012    Years since quitting: 8.2  . Smokeless tobacco: Never Used  . Tobacco comment: Pt doing E-cigarette as of 01/20/12  Vaping Use  . Vaping Use: Former  Substance and Sexual Activity  . Alcohol use: No  . Drug use: No  . Sexual activity: Never  Other Topics Concern  . Not on file  Social History Narrative   Patient on worker's compensation for an injury sustained on  the job 5 years ago.   Social Determinants of Health   Financial Resource Strain: Not on file  Food Insecurity: Not on file  Transportation Needs: Not on file  Physical Activity: Not on file  Stress: Not on file  Social Connections: Not on file  Intimate Partner Violence: Not on file    Family History  Problem Relation Age of Onset  . Lung cancer Mother   . Dementia Father   . Atrial fibrillation Father      Review of Systems  Constitutional: Negative.  Negative for chills and fever.  HENT: Negative.  Negative for congestion and sore throat.   Respiratory: Negative.  Negative for cough and shortness of breath.   Cardiovascular: Negative.  Negative for chest pain and palpitations.  Gastrointestinal: Negative for abdominal pain, blood in stool, diarrhea, melena, nausea and vomiting.  Genitourinary: Negative.  Negative for dysuria and hematuria.  Musculoskeletal: Negative.  Negative for myalgias and neck pain.  Skin: Negative.  Negative for rash.  Neurological: Negative.  Negative for dizziness and headaches.  All other systems reviewed and are negative.  Today's Vitals   05/02/20 1107  BP: (!) 168/70  Pulse: 87  Temp: 98.3 F (36.8 C)  TempSrc: Oral  SpO2: 97%  Weight: 243 lb (110.2 kg)  Height: 5\' 9"  (1.753 m)   Body mass index is 35.88 kg/m.   Physical Exam Vitals reviewed.  Constitutional:      Appearance: Normal appearance. She is obese.  HENT:     Head: Normocephalic.  Eyes:     Extraocular Movements: Extraocular movements intact.     Pupils: Pupils are equal, round, and reactive to light.  Cardiovascular:     Rate and Rhythm: Normal rate.  Pulmonary:     Effort: Pulmonary effort is normal.  Musculoskeletal:     Cervical back: Normal range of motion.  Skin:    General: Skin is warm and dry.  Neurological:     General: No focal deficit present.     Mental Status: She is alert and oriented to person, place, and time.  Psychiatric:        Mood and  Affect: Mood normal.        Behavior: Behavior normal.    Results for orders placed or performed in visit on 05/02/20 (from the past 24 hour(s))  POCT urinalysis dipstick     Status: None   Collection Time: 05/02/20 11:37 AM  Result Value Ref Range   Color, UA yellow yellow   Clarity, UA clear clear   Glucose, UA negative negative mg/dL   Bilirubin, UA negative negative   Ketones, POC UA negative negative mg/dL   Spec Grav, UA [id number] - 1.025   Blood, UA negative negative   pH, UA 6.0 5.0 -  8.0   Protein Ur, POC negative negative mg/dL   Urobilinogen, UA 0.2 0.2 or 1.0 E.U./dL   Nitrite, UA Negative Negative   Leukocytes, UA Negative Negative     ASSESSMENT & PLAN: Matalynn was seen today for recurrent uti.  Diagnoses and all orders for this visit:  Urinary frequency -     POCT urinalysis dipstick -     Urine Culture  History of recurrent infection -     Ambulatory referral to Allergy  History of chronic sinusitis -     Ambulatory referral to Allergy  PTSD (post-traumatic stress disorder) -     Ambulatory referral to Psychiatry    Patient Instructions   Managing Post-Traumatic Stress Disorder If you have been diagnosed with post-traumatic stress disorder (PTSD), you may be relieved that you now know why you have felt or behaved a certain way. Still, you may feel overwhelmed about the treatment ahead. You may also wonder how to get the support you need and how to deal with the condition day-to-day. If you are living with PTSD, there are ways to help you recover from it and manage your symptoms. How to manage lifestyle changes Managing stress Stress is your body's reaction to life changes and events, both good and bad. Stress can make PTSD worse. Take the following steps to manage stress:  Talk with your health care provider or a counselor if you would like to learn more about techniques to reduce your stress. He or she may suggest some stress reduction techniques  such as: ? Muscle relaxation exercises. ? Regular exercise. ? Meditation, yoga, or other mind-body exercises. ? Breathing exercises. ? Listening to quiet music. ? Spending time outside.  Maintain a healthy lifestyle. Eat a healthy diet, exercise regularly, get plenty of sleep, and take time to relax.  Spend time with others. Talk with them about how you are feeling and what kind of support you need. Try not to isolate yourself, even though you may feel like doing that. Isolating yourself can delay your recovery.  Do activities and hobbies that you enjoy.  Pace yourself when doing stressful things. Take breaks, and reward yourself when you finish. Make sure that you do not overload your schedule.   Medicines Your health care provider may suggest certain medicines if he or she feels that they will help to improve your condition. Medicines for depression (antidepressants) or severe loss of contact with reality (antipsychotics) may be used to treat PTSD. Avoid using alcohol and other substances that may prevent your medicines from working properly. It is also important to:  Talk with your pharmacist or health care provider about all medicines that you take, their possible side effects, and which medicines are safe to take together.  Make it your goal to take part in all treatment decisions (shared decision-making). Ask about possible side effects of medicines that your health care provider recommends, and tell him or her how you feel about having those side effects. It is best if shared decision-making with your health care provider is part of your total treatment plan. If your health care provider prescribes a medicine, you may not notice the full benefits of it for 4-8 weeks. Most people who are treated for PTSD need to take medicine for at least 6-12 months before they feel better. If you are taking medicines as part of your treatment, do not stop taking medicines before you ask your health care  provider if it is safe to stop. You may need  to have the medicine slowly decreased (tapered) over time to lower the risk of harmful side effects. Relationships Many people who have PTSD have difficulty trusting others. Make an effort to:  Take risks and develop trust with close friends and family members. Developing trust in others can help you feel safe and connect you with emotional support.  Be open and honest about your feelings.  Have fun and relax in safe spaces, such as with friends and family.  Think about going to couples counseling, family education classes, or family therapy. Your loved ones may not always know how to be supportive. Therapy can be helpful for everyone. How to recognize changes in your condition Be aware of your symptoms and how often you have them. The following symptoms mean that you need to seek help for your PTSD:  You feel suspicious and angry.  You have repeated flashbacks.  You avoid going out or being with others.  You have an increasing number of fights with close friends or family members, such as your spouse.  You have thoughts about hurting yourself or others.  You cannot get relief from feelings of depression or anxiety. Follow these instructions at home: Lifestyle  Exercise regularly. Try to do 30 or more minutes of physical activity on most days of the week.  Try to get 7-9 hours of sleep each night. To help with sleep: ? Keep your bedroom cool and dark. ? Avoid screen time before bedtime. This means avoiding use of your TV, computer, tablet, and cell phone.  Practice self-soothing skills and use them daily.  Try to have fun and seek humor in your life. Eating and drinking  Do not eat a heavy meal during the hour before you go to bed.  Do not drink alcohol or caffeinated drinks before bed.  Avoid using alcohol or drugs. General instructions  If your PTSD is affecting your marriage or family, seek help from a family  therapist.  Remind yourself that recovering from the trauma is a process and takes time.  Take over-the-counter and prescription medicines only as told by your health care provider.  Make sure to let all of your health care providers know that you have PTSD. This is especially important if you are having surgery or need to be admitted to the hospital.  Keep all follow-up visits as told by your health care providers. This is important. Where to find support Talking to others  Explain that PTSD is a mental health problem. It is something that a person can develop after experiencing or seeing a life-threatening event. Tell them that PTSD makes you feel stress like you did during the event.  Talk to your loved ones about the symptoms you have. Also tell them what things or situations can cause symptoms to start (are triggers for you).  Assure your loved ones that there are treatments to help PTSD. Discuss possibly seeking family therapy or couples therapy.  If you are worried or fearful about seeking treatment, ask for support.  Keep daily contact with at least one trusted friend or family member. Finances Not all insurance plans cover mental health care, so it is important to check with your insurance carrier. If paying for co-pays or counseling services is a problem, search for a local or county mental health care center. Public mental health care services may be offered there at a low cost or no cost when you are not able to see a private health care provider. If you are a  veteran, contact a local veterans organization or veterans hospital for more information. If you are taking medicine for PTSD, you may be able to get the genericform, which may be less expensive than brand-name medicine. Some makers of prescription medicines also offer help to patients who cannot afford the medicines that they need. Therapy and support groups  Find a support group in your community. Often, groups are  available for [employer] veterans, trauma victims, and family members or caregivers.  Look into volunteer opportunities. Taking part in these can help you feel more connected to your community.  Contact a local organization to find out if you are eligible for a service dog. Where to find more information Go to this website to find more information about PTSD, treatment of PTSD, and how to get support:  [facility] for PTSD: www.ptsd.[employer].tn Contact a health care provider if:  Your symptoms get worse or do not get better. Get help right away if:  You have thoughts about hurting yourself or others. If you ever feel like you may hurt yourself or others, or have thoughts about taking your own life, get help right away. You can go to your nearest emergency department or call:  Your local emergency services (911 in the [place]  A suicide crisis helpline, such as the National Suicide Prevention Lifeline at 650-521-3978. This is open 24-hours a day. Summary  If you are living with PTSD, there are ways to help you recover from it and manage your symptoms.  Find supportive environments and people who understand PTSD. Spend time in those places, and maintain contact with those people.  Work with your health care team to create a plan for managing PTSD. The plan should include counseling, stress reduction techniques, and healthy lifestyle habits. This information is not intended to replace advice given to you by your health care provider. Make sure you discuss any questions you have with your health care provider. Document Revised: 09/29/2019 Document Reviewed: 09/29/2019 Elsevier Patient Education  2021 Elsevier Inc.      Edwina Barth, MD [facility]

## 2020-05-03 LAB — URINE CULTURE

## 2020-05-09 NOTE — Telephone Encounter (Signed)
Opened in error

## 2020-05-10 ENCOUNTER — Ambulatory Visit (INDEPENDENT_AMBULATORY_CARE_PROVIDER_SITE_OTHER): Payer: Medicare HMO | Admitting: Otolaryngology

## 2020-06-05 ENCOUNTER — Telehealth: Payer: Self-pay | Admitting: Emergency Medicine

## 2020-06-05 NOTE — Telephone Encounter (Signed)
LVM for pt to rtn my call to schedule awv with [facility].. Please schedule AWV if pt calls the office.  

## 2020-06-06 ENCOUNTER — Other Ambulatory Visit: Payer: Self-pay | Admitting: Emergency Medicine

## 2020-06-06 DIAGNOSIS — F418 Other specified anxiety disorders: Secondary | ICD-10-CM

## 2020-07-01 ENCOUNTER — Other Ambulatory Visit: Payer: Self-pay | Admitting: Emergency Medicine

## 2020-07-01 DIAGNOSIS — E785 Hyperlipidemia, unspecified: Secondary | ICD-10-CM

## 2020-07-01 DIAGNOSIS — I10 Essential (primary) hypertension: Secondary | ICD-10-CM

## 2020-07-11 ENCOUNTER — Telehealth: Payer: Self-pay | Admitting: Emergency Medicine

## 2020-07-11 NOTE — Telephone Encounter (Signed)
LVM for pt to rtn my call to schedule AWV with [facility]. Please schedule this appt if pt calls the office.  °

## 2020-07-14 ENCOUNTER — Other Ambulatory Visit: Payer: Self-pay | Admitting: Emergency Medicine

## 2020-07-14 DIAGNOSIS — E119 Type 2 diabetes mellitus without complications: Secondary | ICD-10-CM

## 2020-07-14 DIAGNOSIS — F418 Other specified anxiety disorders: Secondary | ICD-10-CM

## 2020-07-14 DIAGNOSIS — I10 Essential (primary) hypertension: Secondary | ICD-10-CM

## 2020-07-16 ENCOUNTER — Other Ambulatory Visit: Payer: Self-pay | Admitting: Emergency Medicine

## 2020-07-16 DIAGNOSIS — F418 Other specified anxiety disorders: Secondary | ICD-10-CM

## 2020-07-16 DIAGNOSIS — F4323 Adjustment disorder with mixed anxiety and depressed mood: Secondary | ICD-10-CM

## 2020-08-06 ENCOUNTER — Telehealth: Payer: Self-pay | Admitting: Emergency Medicine

## 2020-08-06 NOTE — Telephone Encounter (Signed)
   Patient called and said that she got an appointment with Dr. Ezzard Standing for ENT for 8/1 and wanted to let Dr. Herbert Spires know

## 2020-08-06 NOTE — Telephone Encounter (Signed)
Thanks

## 2020-08-28 ENCOUNTER — Ambulatory Visit (INDEPENDENT_AMBULATORY_CARE_PROVIDER_SITE_OTHER): Payer: Medicare HMO | Admitting: Otolaryngology

## 2020-09-04 ENCOUNTER — Ambulatory Visit (INDEPENDENT_AMBULATORY_CARE_PROVIDER_SITE_OTHER): Payer: Medicare HMO | Admitting: Emergency Medicine

## 2020-09-04 ENCOUNTER — Encounter: Payer: Self-pay | Admitting: Emergency Medicine

## 2020-09-04 ENCOUNTER — Other Ambulatory Visit: Payer: Self-pay

## 2020-09-04 VITALS — BP 132/80 | HR 96 | Temp 98.7°F | Ht 69.0 in | Wt 235.0 lb

## 2020-09-04 DIAGNOSIS — R8290 Unspecified abnormal findings in urine: Secondary | ICD-10-CM | POA: Diagnosis not present

## 2020-09-04 DIAGNOSIS — G5792 Unspecified mononeuropathy of left lower limb: Secondary | ICD-10-CM | POA: Diagnosis not present

## 2020-09-04 LAB — POCT URINALYSIS DIP (MANUAL ENTRY)
Bilirubin, UA: NEGATIVE
Blood, UA: NEGATIVE
Glucose, UA: NEGATIVE mg/dL
Ketones, POC UA: NEGATIVE mg/dL
Leukocytes, UA: NEGATIVE
Nitrite, UA: NEGATIVE
Protein Ur, POC: NEGATIVE mg/dL
Spec Grav, UA: 1.015 (ref 1.010–1.025)
Urobilinogen, UA: 0.2 E.U./dL
pH, UA: 6 (ref 5.0–8.0)

## 2020-09-04 NOTE — Progress Notes (Signed)
Jasmine Long 70 y.o.   Chief Complaint  Patient presents with   Fall    Pt states she has nerve damage in her left leg.   Dysuria    Cloudy urine    HISTORY OF PRESENT ILLNESS: [name] with history of nerve damage to her left leg secondary to fall at work about 8 years ago.  Occasionally left leg "gives out" and patient loses her balance.  She fell several days ago.  Luckily no significant injuries. Also complaining of cloudy urine, possible UTI for several days.  Symptoms however have improved in the past 48 hours.  Denies hematuria.  Denies fever or chills.  Denies flank pain nausea or vomiting. No other complaints or medical concerns today.  HPI   Prior to Admission medications   Medication Sig Start Date End Date Taking? Authorizing Provider  ALPRAZolam Prudy Feeler) 0.5 MG tablet TAKE 1 TABLET BY MOUTH EVERY DAY AS NEEDED FOR ANXIETY 07/16/20  Yes Deontrae Drinkard, Eilleen Kempf, MD  atorvastatin (LIPITOR) 20 MG tablet TAKE 1 TABLET BY MOUTH EVERY DAY 07/01/20  Yes [name], MD  buPROPion (WELLBUTRIN XL) 300 MG 24 hr tablet TAKE 1 TABLET BY MOUTH EVERY DAY 07/14/20  Yes Sonja Manseau, Eilleen Kempf, MD  clonazePAM (KLONOPIN) 1 MG tablet TAKE 1 TABLET BY MOUTH AT BEDTIME FOR SLEEP OR ANXIETY 06/06/20  Yes [name], MD  levothyroxine (SYNTHROID) 125 MCG tablet TAKE 1 TABLET (125 MCG TOTAL) BY MOUTH DAILY BEFORE BREAKFAST. 10/17/19  Yes Aariel Ems, Eilleen Kempf, MD  losartan (COZAAR) 100 MG tablet TAKE 1 TABLET BY MOUTH EVERY DAY 07/01/20  Yes [name], MD  metFORMIN (GLUCOPHAGE) 1000 MG tablet TAKE 1 TABLET BY MOUTH TWICE A DAY WITH MEALS 07/14/20  Yes Winter Trefz, Eilleen Kempf, MD  metoprolol succinate (TOPROL-XL) 25 MG 24 hr tablet TAKE 1 TABLET BY MOUTH EVERY DAY 07/14/20  Yes Quatisha Zylka, Eilleen Kempf, MD  nicotine (NICODERM CQ - DOSED IN MG/24 HOURS) 21 mg/24hr patch Place 1 patch (21 mg total) onto the skin daily. 04/20/18  Yes [name], MD    Not on  File  Patient Active Problem List   Diagnosis Date Noted   Chronic anxiety 06/28/2019   History of peptic ulcer 04/30/2017   Hyperlipidemia 12/11/2015   Insomnia 12/11/2015   Panic attack as reaction to stress 12/11/2015   Current smoker 12/11/2015   Chronic sinusitis 12/11/2015   Dyslipidemia associated with type 2 diabetes mellitus (HCC) 05/15/2015   Hypothyroid 04/29/2011   BMI 40.0-44.9, adult (HCC) 04/29/2011   Depression with anxiety 04/29/2011   GERD (gastroesophageal reflux disease) 04/29/2011   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   Nicotine addiction 04/29/2011   Hypertension associated with diabetes (HCC) 04/29/2011   Murmur, cardiac 04/29/2011    Past Medical History:  Diagnosis Date   Allergy    Anxiety    Depression    Heart murmur    Hypertension    Thyroid disease    Ulcer     Past Surgical History:  Procedure Laterality Date   BREAST SURGERY     CESAREAN SECTION     CHOLECYSTECTOMY     NASAL SEPTUM SURGERY      Social History   Socioeconomic History   Marital status: Divorced    Spouse name: Not on file   Number of children: Not on file   Years of education: Not on file   Highest education level: Not on file  Occupational History  Occupation: Unemployed  Tobacco Use   Smoking status: Every Day    Packs/day: 1.50    Years: 40.00    Pack years: 60.00    Types: Cigarettes    Last attempt to quit: 01/20/2012    Years since quitting: 8.6   Smokeless tobacco: Never   Tobacco comments:    Pt doing E-cigarette as of 01/20/12  Vaping Use   Vaping Use: Former  Substance and Sexual Activity   Alcohol use: No   Drug use: No   Sexual activity: Never  Other Topics Concern   Not on file  Social History Narrative   Patient on worker's compensation for an injury sustained on the job 5 years ago.   Social Determinants of Health   Financial Resource Strain: Not on file  Food Insecurity: Not on file  Transportation Needs: Not on file  Physical  Activity: Not on file  Stress: Not on file  Social Connections: Not on file  Intimate Partner Violence: Not on file    Family History  Problem Relation Age of Onset   Lung cancer Mother    Dementia Father    Atrial fibrillation Father      Review of Systems  Constitutional: Negative.  Negative for chills and fever.  HENT: Negative.  Negative for congestion and sore throat.   Respiratory: Negative.  Negative for cough and shortness of breath.   Cardiovascular: Negative.  Negative for chest pain and palpitations.  Gastrointestinal: Negative.  Negative for abdominal pain, diarrhea, nausea and vomiting.  Genitourinary:        Cloudy urine Initial symptoms improved  Skin: Negative.  Negative for rash.  Neurological:  Negative for dizziness and headaches.  All other systems reviewed and are negative.  Today's Vitals   09/04/20 1355  BP: 132/80  Pulse: 96  Temp: 98.7 F (37.1 C)  TempSrc: Oral  SpO2: 97%  Weight: 235 lb (106.6 kg)  Height: 5\' 9"  (1.753 m)   Body mass index is 34.7 kg/m. Wt Readings from Last 3 Encounters:  09/04/20 235 lb (106.6 kg)  05/02/20 243 lb (110.2 kg)  03/26/20 244 lb 3.2 oz (110.8 kg)    Physical Exam Vitals reviewed.  Constitutional:      Appearance: Normal appearance.  HENT:     Head: Normocephalic.  Eyes:     Extraocular Movements: Extraocular movements intact.     Pupils: Pupils are equal, round, and reactive to light.  Cardiovascular:     Rate and Rhythm: Normal rate and regular rhythm.     Pulses: Normal pulses.     Heart sounds: Normal heart sounds.  Pulmonary:     Effort: Pulmonary effort is normal.     Breath sounds: Normal breath sounds.  Abdominal:     Tenderness: There is no abdominal tenderness. There is no right CVA tenderness or left CVA tenderness.  Musculoskeletal:     Cervical back: Normal range of motion.  Skin:    General: Skin is warm and dry.     Capillary Refill: Capillary refill takes less than 2 seconds.   Neurological:     General: No focal deficit present.     Mental Status: She is alert and oriented to person, place, and time.     Deep Tendon Reflexes: Reflexes abnormal.     Reflex Scores:      Patellar reflexes are 0 on the right side and 0 on the left side.      Achilles reflexes are 0 on the  right side and 0 on the left side.    Comments: Diminished sensation to lateral aspect of left lower leg Decreased DTRs on both legs  Psychiatric:        Mood and Affect: Mood normal.        Behavior: Behavior normal.   Results for orders placed or performed in visit on 09/04/20 (from the past 24 hour(s))  POCT urinalysis dipstick     Status: None   Collection Time: 09/04/20  2:08 PM  Result Value Ref Range   Color, UA yellow yellow   Clarity, UA clear clear   Glucose, UA negative negative mg/dL   Bilirubin, UA negative negative   Ketones, POC UA negative negative mg/dL   Spec Grav, UA 5.188 4.166 - 1.025   Blood, UA negative negative   pH, UA 6.0 5.0 - 8.0   Protein Ur, POC negative negative mg/dL   Urobilinogen, UA 0.2 0.2 or 1.0 E.U./dL   Nitrite, UA Negative Negative   Leukocytes, UA Negative Negative     ASSESSMENT & PLAN: A total of 30 minutes was spent with the patient and counseling/coordination of care regarding preparing for this visit, review of most recent office visit notes, review of most recent blood work and urine results, differential diagnosis of leg neuropathy and need for neurology evaluation and nerve conduction studies, prognosis, documentation, need for follow-up.  Islah was seen today for fall and dysuria.  Diagnoses and all orders for this visit:  Cloudy urine -     POCT urinalysis dipstick -     Urine Culture  Neuropathy of left lower extremity -     Ambulatory referral to Neurology  No signs or symptoms of UTI.  Urine culture sent.  No need for antibiotics at present time. Recurrent falls due to left leg giving out is concerning.  Patient has history  of neuropathy diagnosed about 8 years ago.  Needs reevaluation by neurologist for possible nerve conduction studies.  Patient Instructions  Health Maintenance After Age 82 After age 33, you are at a higher risk for certain long-term diseases and infections as well as injuries from falls. Falls are a major cause of broken bones and head injuries in people who are older than age 1. Getting regular preventive care can help to keep you healthy and well. Preventive care includes getting regular testing and making lifestyle changes as recommended by your health care provider. Talk with your health care provider about: Which screenings and tests you should have. A screening is a test that checks for a disease when you have no symptoms. A diet and exercise plan that is right for you. What should I know about screenings and tests to prevent falls? Screening and testing are the best ways to find a health problem early. Early diagnosis and treatment give you the best chance of managing medical conditions that are common after age 46. Certain conditions and lifestyle choices may make you more likely to have a fall. Your health care provider may recommend: Regular vision checks. Poor vision and conditions such as cataracts can make you more likely to have a fall. If you wear glasses, make sure to get your prescription updated if your vision changes. Medicine review. Work with your health care provider to regularly review all of the medicines you are taking, including over-the-counter medicines. Ask your health care provider about any side effects that may make you more likely to have a fall. Tell your health care provider if any medicines that  you take make you feel dizzy or sleepy. Osteoporosis screening. Osteoporosis is a condition that causes the bones to get weaker. This can make the bones weak and cause them to break more easily. Blood pressure screening. Blood pressure changes and medicines to control blood  pressure can make you feel dizzy. Strength and balance checks. Your health care provider may recommend certain tests to check your strength and balance while standing, walking, or changing positions. Foot health exam. Foot pain and numbness, as well as not wearing proper footwear, can make you more likely to have a fall. Depression screening. You may be more likely to have a fall if you have a fear of falling, feel emotionally low, or feel unable to do activities that you used to do. Alcohol use screening. Using too much alcohol can affect your balance and may make you more likely to have a fall. What actions can I take to lower my risk of falls? General instructions Talk with your health care provider about your risks for falling. Tell your health care provider if: You fall. Be sure to tell your health care provider about all falls, even ones that seem minor. You feel dizzy, sleepy, or off-balance. Take over-the-counter and prescription medicines only as told by your health care provider. These include any supplements. Eat a healthy diet and maintain a healthy weight. A healthy diet includes low-fat dairy products, low-fat (lean) meats, and fiber from whole grains, beans, and lots of fruits and vegetables. Home safety Remove any tripping hazards, such as rugs, cords, and clutter. Install safety equipment such as grab bars in bathrooms and safety rails on stairs. Keep rooms and walkways well-lit. Activity  Follow a regular exercise program to stay fit. This will help you maintain your balance. Ask your health care provider what types of exercise are appropriate for you. If you need a cane or walker, use it as recommended by your health care provider. Wear supportive shoes that have nonskid soles.  Lifestyle Do not drink alcohol if your health care provider tells you not to drink. If you drink alcohol, limit how much you have: 0-1 drink a day for women. 0-2 drinks a day for men. Be aware of  how much alcohol is in your drink. In the [place] one drink equals one typical bottle of beer (12 oz), one-half glass of wine (5 oz), or one shot of hard liquor (1 oz). Do not use any products that contain nicotine or tobacco, such as cigarettes and e-cigarettes. If you need help quitting, ask your health care provider. Summary Having a healthy lifestyle and getting preventive care can help to protect your health and wellness after age 68. Screening and testing are the best way to find a health problem early and help you avoid having a fall. Early diagnosis and treatment give you the best chance for managing medical conditions that are more common for people who are older than age 68. Falls are a major cause of broken bones and head injuries in people who are older than age 68. Take precautions to prevent a fall at home. Work with your health care provider to learn what changes you can make to improve your health and wellness and to prevent falls. This information is not intended to replace advice given to you by your health care provider. Make sure you discuss any questions you have with your healthcare provider. Document Revised: 12/29/2019 Document Reviewed: 12/29/2019 Elsevier Patient Education  2022 Elsevier Inc.    Edwina BarthMiguel Mar Zettler, MD  [facility]

## 2020-09-04 NOTE — Patient Instructions (Signed)

## 2020-09-05 ENCOUNTER — Encounter: Payer: Self-pay | Admitting: Neurology

## 2020-09-05 NOTE — Addendum Note (Signed)
Addended by: Nena Polio on: 09/05/2020 02:12 PM   Modules accepted: Orders

## 2020-09-11 ENCOUNTER — Other Ambulatory Visit: Payer: Medicare HMO

## 2020-09-11 ENCOUNTER — Other Ambulatory Visit: Payer: Self-pay

## 2020-09-11 DIAGNOSIS — R8290 Unspecified abnormal findings in urine: Secondary | ICD-10-CM | POA: Diagnosis not present

## 2020-09-12 LAB — URINE CULTURE

## 2020-09-26 ENCOUNTER — Other Ambulatory Visit: Payer: Self-pay | Admitting: Emergency Medicine

## 2020-09-26 ENCOUNTER — Telehealth: Payer: Self-pay | Admitting: Emergency Medicine

## 2020-09-26 DIAGNOSIS — I10 Essential (primary) hypertension: Secondary | ICD-10-CM

## 2020-09-26 NOTE — Telephone Encounter (Signed)
Called and spoke with pt about lab results. 

## 2020-09-26 NOTE — Telephone Encounter (Signed)
Patient calling in about lab results for urine culture  Patient says no one ever called her back with her results to determine if she had a UTI   Please call patient w/ results [phone]

## 2020-10-08 ENCOUNTER — Other Ambulatory Visit: Payer: Self-pay

## 2020-10-08 ENCOUNTER — Ambulatory Visit (INDEPENDENT_AMBULATORY_CARE_PROVIDER_SITE_OTHER): Payer: Medicare HMO | Admitting: Otolaryngology

## 2020-10-08 DIAGNOSIS — J310 Chronic rhinitis: Secondary | ICD-10-CM

## 2020-10-08 MED ORDER — TRIAMCINOLONE ACETONIDE 55 MCG/ACT NA AERO: 2.0000 | INHALATION_SPRAY | Freq: Every day | NASAL | 12 refills | Status: DC

## 2020-10-08 NOTE — Progress Notes (Signed)
HPI: Jasmine Long is a 70 y.o. female who presents is referred by her PCP for evaluation of chronic "sinus problems".  She complains of intermittently being stopped up with chronic postnasal drainage.  She has previously been followed by Dr.Crossley until he retired.  She used to use a lot of Sinutab's and Afrin but has stopped using these.  She presently uses Claritin occasionally.  She does have history of allergies and is seen by our allergy in the past.  She states that she has had allergies since she was a child.  She is presently not using any nasal sprays..  Past Medical History:  Diagnosis Date   Allergy    Anxiety    Depression    Heart murmur    Hypertension    Thyroid disease    Ulcer    Past Surgical History:  Procedure Laterality Date   BREAST SURGERY     CESAREAN SECTION     CHOLECYSTECTOMY     NASAL SEPTUM SURGERY     Social History   Socioeconomic History   Marital status: Divorced    Spouse name: Not on file   Number of children: Not on file   Years of education: Not on file   Highest education level: Not on file  Occupational History   Occupation: Unemployed  Tobacco Use   Smoking status: Every Day    Packs/day: 1.50    Years: 40.00    Pack years: 60.00    Types: Cigarettes    Last attempt to quit: 01/20/2012    Years since quitting: 8.7   Smokeless tobacco: Never   Tobacco comments:    Pt doing E-cigarette as of 01/20/12  Vaping Use   Vaping Use: Former  Substance and Sexual Activity   Alcohol use: No   Drug use: No   Sexual activity: Never  Other Topics Concern   Not on file  Social History Narrative   Patient on worker's compensation for an injury sustained on the job 5 years ago.   Social Determinants of Health   Financial Resource Strain: Not on file  Food Insecurity: Not on file  Transportation Needs: Not on file  Physical Activity: Not on file  Stress: Not on file  Social Connections: Not on file   Family History  Problem Relation  Age of Onset   Lung cancer Mother    Dementia Father    Atrial fibrillation Father    Not on File Prior to Admission medications   Medication Sig Start Date End Date Taking? Authorizing Provider  ALPRAZolam Prudy Feeler) 0.5 MG tablet TAKE 1 TABLET BY MOUTH EVERY DAY AS NEEDED FOR ANXIETY 07/16/20   [name], MD  atorvastatin (LIPITOR) 20 MG tablet TAKE 1 TABLET BY MOUTH EVERY DAY 07/01/20   [name], MD  buPROPion (WELLBUTRIN XL) 300 MG 24 hr tablet TAKE 1 TABLET BY MOUTH EVERY DAY 07/14/20   [name], MD  clonazePAM (KLONOPIN) 1 MG tablet TAKE 1 TABLET BY MOUTH AT BEDTIME FOR SLEEP OR ANXIETY 06/06/20   [name], MD  levothyroxine (SYNTHROID) 125 MCG tablet TAKE 1 TABLET (125 MCG TOTAL) BY MOUTH DAILY BEFORE BREAKFAST. 10/17/19   [name], MD  losartan (COZAAR) 100 MG tablet TAKE 1 TABLET BY MOUTH EVERY DAY 09/26/20   [name], MD  metFORMIN (GLUCOPHAGE) 1000 MG tablet TAKE 1 TABLET BY MOUTH TWICE A DAY WITH MEALS 07/14/20   [name], MD  metoprolol succinate (TOPROL-XL)  25 MG 24 hr tablet TAKE 1 TABLET BY MOUTH EVERY DAY 07/14/20   [name], MD  nicotine (NICODERM CQ - DOSED IN MG/24 HOURS) 21 mg/24hr patch Place 1 patch (21 mg total) onto the skin daily. 04/20/18   [name], MD     Positive ROS: Otherwise negative  All other systems have been reviewed and were otherwise negative with the exception of those mentioned in the HPI and as above.  Physical Exam: Constitutional: Alert, well-appearing, no acute distress Ears: External ears without lesions or tenderness. Ear canals are clear bilaterally with intact, clear TMs.  Nasal: External nose without lesions. Septum with minimal deformity and moderate rhinitis.  After decongesting the nose with Afrin nasal endoscopy was performed and on nasal endoscopy both middle meatus regions were clear with no mucopurulent discharge noted.  Mild edema  but no polyps noted.  Nasopharynx was clear.  No mucopurulent discharge noted..  Oral: Lips and gums without lesions. Tongue and palate mucosa without lesions. Posterior oropharynx clear. Neck: No palpable adenopathy or masses Respiratory: Breathing comfortably  Skin: No facial/neck lesions or rash noted.  Procedures  Assessment: Chronic rhinitis. History of allergic rhinitis. History of sinus infections but no clinical evidence of active sinus infection on exam today.  Plan: Recommended regular use of nasal steroid spray and prescribed Nasacort 2 sprays each nostril at night as this should help some with nasal congestion and sinus obstruction. Also reviewed with her concerning use of saline rinses and suggested using the AYR or Xlear brands of the saline nasal rinses as needed postnasal drainage. Reassured her today no evidence of active sinus infection.  No intranasal polyps noted or obstructive lesions noted within the nasal cavity otherwise.   Narda Bonds, MD   CC:

## 2020-11-04 ENCOUNTER — Other Ambulatory Visit: Payer: Self-pay

## 2020-11-04 ENCOUNTER — Ambulatory Visit: Payer: Medicare HMO | Admitting: Neurology

## 2020-11-04 ENCOUNTER — Encounter: Payer: Self-pay | Admitting: Neurology

## 2020-11-04 VITALS — BP 173/79 | HR 97 | Ht 69.0 in | Wt 235.0 lb

## 2020-11-04 DIAGNOSIS — R200 Anesthesia of skin: Secondary | ICD-10-CM | POA: Diagnosis not present

## 2020-11-04 DIAGNOSIS — M79605 Pain in left leg: Secondary | ICD-10-CM | POA: Diagnosis not present

## 2020-11-04 NOTE — Progress Notes (Signed)
[facility] Neurology Division Clinic Note - Initial Visit   Date: 11/04/20  Jasmine Long MRN: [id number] DOB: Dec 25, 1950   Dear Dr. Alvy Bimler:  Thank you for your kind referral of Jasmine Long for consultation of left leg pain. Although her history is well known to you, please allow [place] to reiterate it for the purpose of our medical record. The patient was accompanied to the clinic by self.    History of Present Illness: Jasmine Long is a 70 y.o. ambidextrious-handed female with hypertension, hypothyroidism, anxiety/depression, GERD, and former tobacco user presenting for evaluation of left leg pain.  Starting around 2016, she began having sharp pain from her medial groin into the medial knee.  Pain occurs daily without any specific trigger.  It can is present at rest and with activity.  Sometimes, it causes her leg to buckle.  She prefers to use a shopping cart when grocery shopping because it helps with support. She also complains of numbness over the left lateral thigh and lower leg.  She had prior NCS/EMG by Dr. Vela Prose when symptoms first started and was told she has nerve injury. MRI lumbar spine from 2017 shows degenerative changes without nerve impingement.  No benefit with PT. She is requesting repeat NCS/EMG. She has fallen about 2-3 times per year.     Out-side paper records, electronic medical record, and images have been reviewed where available and summarized as:  MRI lumbar spine wo 06/03/2015: This study is not changed substantially compared to 2015 except for some progression of facet degeneration. L4-5:  grade 1 spondylolisthesis of L4 on 5 due to severe facet arthrosis with degenerative ankylosis.  No canal or foraminal stenosis. L3-4: Slight asymmetric disc bulging to the right.  Moderate to severe bilateral facet arthrosis L5-S1: Moderate to severe bilateral facet arthrosis  Lab Results  Component Value Date   HGBA1C 6.3 (A) 03/26/2020   No results found for:  VITAMINB12 Lab Results  Component Value Date   TSH 0.529 03/26/2020   No results found for: ESRSEDRATE, POCTSEDRATE  Past Medical History:  Diagnosis Date   Allergy    Anxiety    Depression    Heart murmur    Hypertension    Thyroid disease    Ulcer     Past Surgical History:  Procedure Laterality Date   BREAST SURGERY     CESAREAN SECTION     CHOLECYSTECTOMY     NASAL SEPTUM SURGERY       Medications:  Outpatient Encounter Medications as of 11/04/2020  Medication Sig Note   ALPRAZolam (XANAX) 0.5 MG tablet TAKE 1 TABLET BY MOUTH EVERY DAY AS NEEDED FOR ANXIETY    atorvastatin (LIPITOR) 20 MG tablet TAKE 1 TABLET BY MOUTH EVERY DAY    buPROPion (WELLBUTRIN XL) 300 MG 24 hr tablet TAKE 1 TABLET BY MOUTH EVERY DAY    clonazePAM (KLONOPIN) 1 MG tablet TAKE 1 TABLET BY MOUTH AT BEDTIME FOR SLEEP OR ANXIETY    levothyroxine (SYNTHROID) 125 MCG tablet TAKE 1 TABLET (125 MCG TOTAL) BY MOUTH DAILY BEFORE BREAKFAST.    losartan (COZAAR) 100 MG tablet TAKE 1 TABLET BY MOUTH EVERY DAY    metFORMIN (GLUCOPHAGE) 1000 MG tablet TAKE 1 TABLET BY MOUTH TWICE A DAY WITH MEALS    metoprolol succinate (TOPROL-XL) 25 MG 24 hr tablet TAKE 1 TABLET BY MOUTH EVERY DAY    nicotine (NICODERM CQ - DOSED IN MG/24 HOURS) 21 mg/24hr patch Place 1 patch (21 mg total) onto  the skin daily. 08/10/2019: prn   triamcinolone (NASACORT) 55 MCG/ACT AERO nasal inhaler Place 2 sprays into the nose daily. 2 sprays each nostril at night when you go to bed    No facility-administered encounter medications on file as of 11/04/2020.    Allergies: No Known Allergies  Family History: Family History  Problem Relation Age of Onset   Lung cancer Mother    Dementia Father    Atrial fibrillation Father     Social History: Social History   Tobacco Use   Smoking status: Every Day    Packs/day: 1.50    Years: 40.00    Pack years: 60.00    Types: Cigarettes    Last attempt to quit: 01/20/2012    Years since  quitting: 8.7   Smokeless tobacco: Never   Tobacco comments:    Pt doing E-cigarette as of 01/20/12  Vaping Use   Vaping Use: Former  Substance Use Topics   Alcohol use: No   Drug use: No   Social History   Social History Narrative   Patient on worker's compensation for an injury sustained on the job 5 years ago.      Right handed and Left Handed       Lives in a two story home       One son deceased.     Vital Signs:  BP (!) 173/79   Pulse 97   Ht 5\' 9"  (1.753 m)   Wt 235 lb (106.6 kg)   SpO2 98%   BMI 34.70 kg/m   Neurological Exam: MENTAL STATUS including orientation to time, place, person, recent and remote memory, attention span and concentration, language, and fund of knowledge is normal.  Speech is not dysarthric.  CRANIAL NERVES: II:  No visual field defects.    III-IV-VI: Pupils equal round and reactive to light.  Normal conjugate, extra-ocular eye movements in all directions of gaze.  No nystagmus.  No ptosis.   V:  Normal facial sensation.    VII:  Normal facial symmetry and movements.   VIII:  Normal hearing and vestibular function.   IX-X:  Normal palatal movement.   XI:  Normal shoulder shrug and head rotation.   XII:  Normal tongue strength and range of motion, no deviation or fasciculation.  MOTOR:  No atrophy, fasciculations or abnormal movements.  No pronator drift.   Upper Extremity:  Right  Left  Deltoid  5/5   5/5   Biceps  5/5   5/5   Triceps  5/5   5/5   Infraspinatus 5/5  5/5  Medial pectoralis 5/5  5/5  Wrist extensors  5/5   5/5   Wrist flexors  5/5   5/5   Finger extensors  5/5   5/5   Finger flexors  5/5   5/5   Dorsal interossei  5/5   5/5   Abductor pollicis  5/5   5/5   Tone (Ashworth scale)  0  0   Lower Extremity:  Right  Left  Hip flexors  5/5   5/5   Hip extensors  5/5   5/5   Adductor 5/5  5/5  Abductor 5/5  5/5  Knee flexors  5/5   5/5   Knee extensors  5/5   5/5   Dorsiflexors  5/5   5/5   Plantarflexors  5/5    5/5   Toe extensors  5/5   5/5   Toe flexors  5/5   5/5  Tone (Ashworth scale)  0  0   MSRs:  Right        Left                  brachioradialis 2+  2+  biceps 2+  2+  triceps 2+  2+  patellar 2+  2+  ankle jerk 2+  2+  Hoffman no  no  plantar response down  down   SENSORY:  Reduced temperature and pin prick over the lateral thigh, lateral knee and proximal leg.  Vibration intact throughout.  COORDINATION/GAIT: Normal finger-to- nose-finger.  Intact rapid alternating movements bilaterally.   Gait appears mildly antalgic, unassisted and stable.  IMPRESSION: Left leg pain, involving medial thigh ?adductor tendinopathy; symptoms are not consistent with L2-3 radiculopathy Left lateral thigh and lower leg numbness; symptoms extend beyond the distribution of the lateral femoral cutaneous nerve, making meralgia paresthetica unlikely.  MRI lumbar spine from 2017 does not show any nerve impingement.  PLAN/RECOMMENDATIONS:  NCS/EMG of the left leg to better characterize the nature of her symptoms PT declined  Further recommendations pending results.   Thank you for allowing me to participate in patient's care.  If I can answer any additional questions, I would be pleased to do so.    Sincerely,    Tayana Shankle K. Allena Katz, DO

## 2020-11-04 NOTE — Patient Instructions (Addendum)
Nerve testing of the left  ELECTROMYOGRAM AND NERVE CONDUCTION STUDIES (EMG/NCS) INSTRUCTIONS  How to Prepare The neurologist conducting the EMG will need to know if you have certain medical conditions. Tell the neurologist and other EMG lab personnel if you: Have a pacemaker or any other electrical medical device Take blood-thinning medications Have hemophilia, a blood-clotting disorder that causes prolonged bleeding Bathing Take a shower or bath shortly before your exam in order to remove oils from your skin. Don't apply lotions or creams before the exam.  What to Expect You'll likely be asked to change into a hospital gown for the procedure and lie down on an examination table. The following explanations can help you understand what will happen during the exam.  Electrodes. The neurologist or a technician places surface electrodes at various locations on your skin depending on where you're experiencing symptoms. Or the neurologist may insert needle electrodes at different sites depending on your symptoms.  Sensations. The electrodes will at times transmit a tiny electrical current that you may feel as a twinge or spasm. The needle electrode may cause discomfort or pain that usually ends shortly after the needle is removed. If you are concerned about discomfort or pain, you may want to talk to the neurologist about taking a short break during the exam.  Instructions. During the needle EMG, the neurologist will assess whether there is any spontaneous electrical activity when the muscle is at rest - activity that isn't present in healthy muscle tissue - and the degree of activity when you slightly contract the muscle.  He or she will give you instructions on resting and contracting a muscle at appropriate times. Depending on what muscles and nerves the neurologist is examining, he or she may ask you to change positions during the exam.  After your EMG You may experience some temporary, minor  bruising where the needle electrode was inserted into your muscle. This bruising should fade within several days. If it persists, contact your primary care doctor.

## 2020-11-13 IMAGING — DX DG HAND COMPLETE 3+V*R*
3 series · 3 of 3 positions shown · non-contrast
Comparison: None.

CLINICAL DATA: Right wrist pain.  Ganglion cyst.

EXAM:
RIGHT HAND - COMPLETE 3+ VIEW

[hand pa]
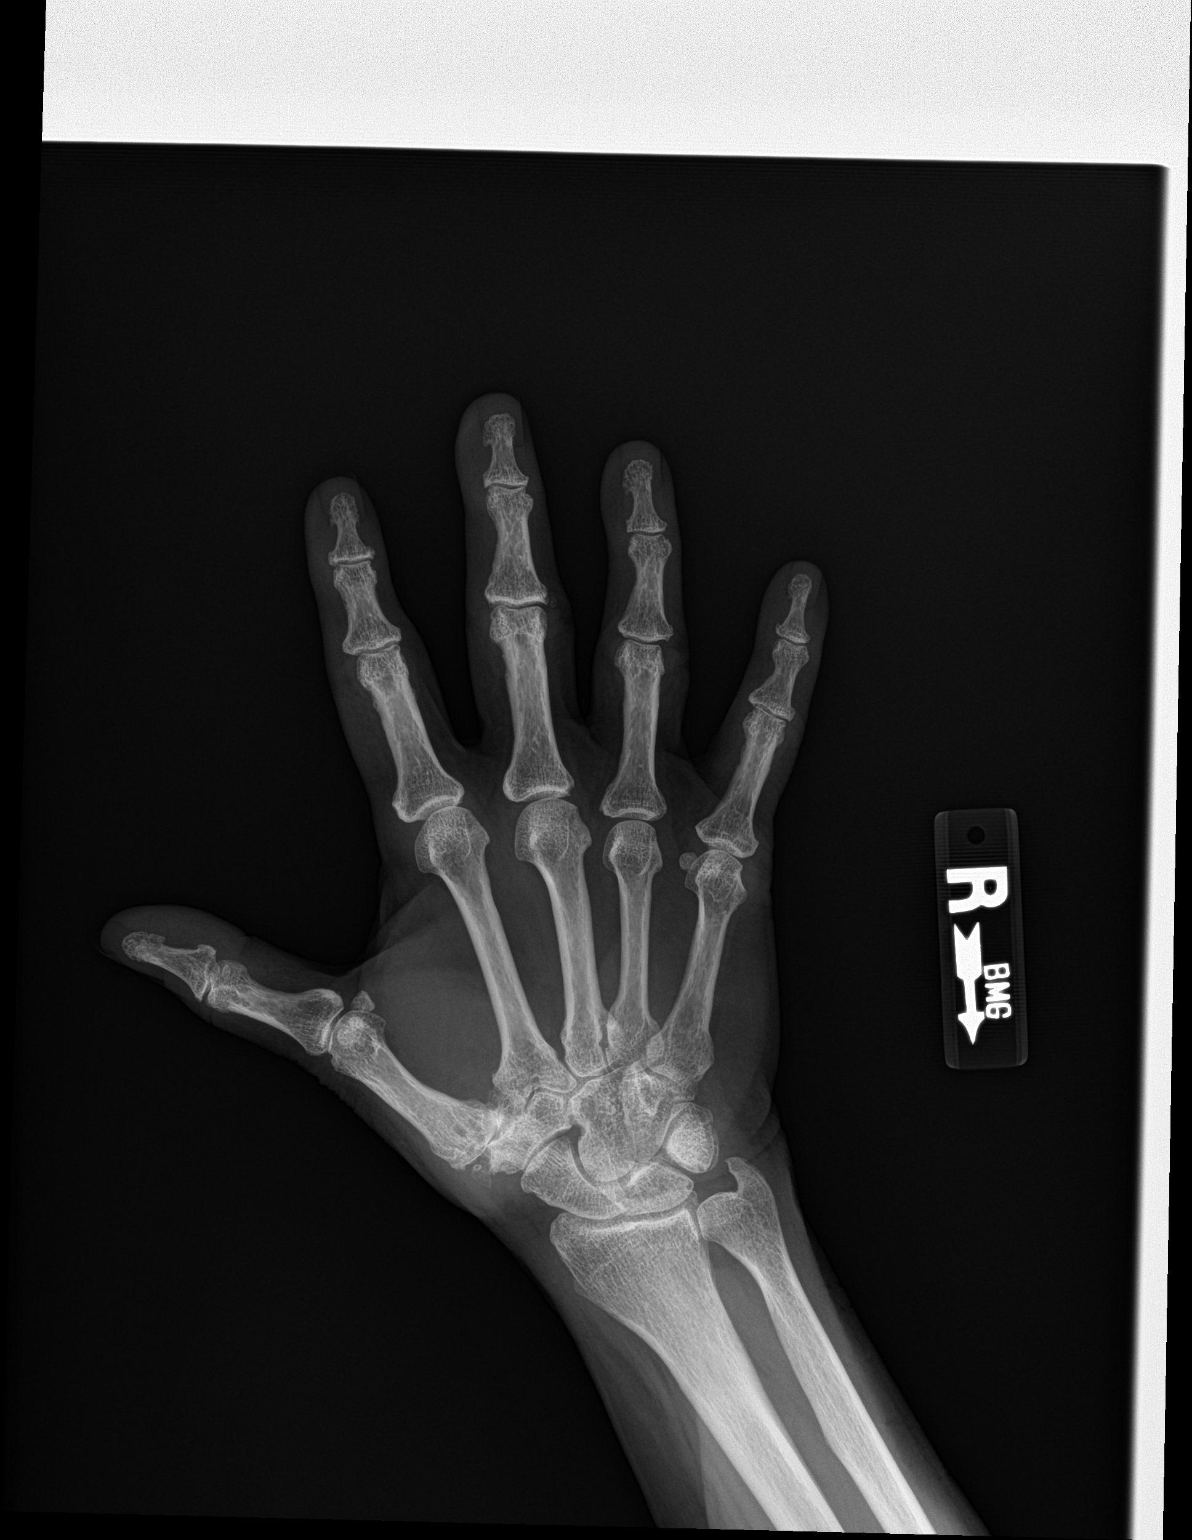

[hand obl]
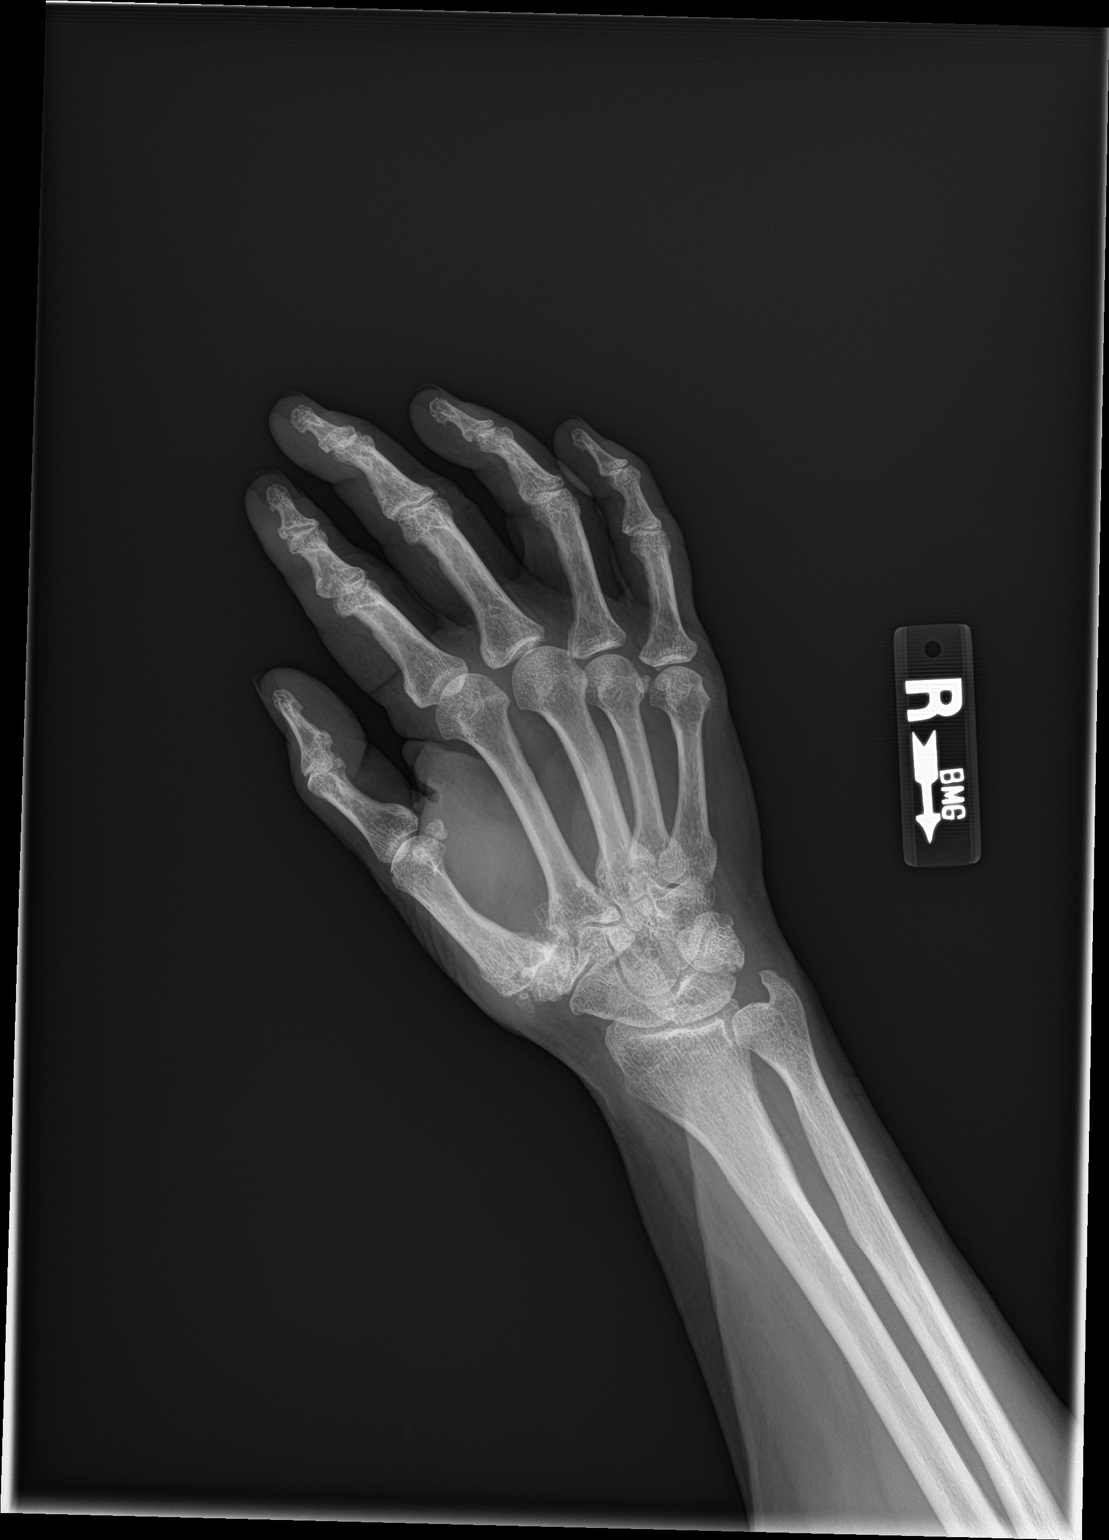

[hand lat]
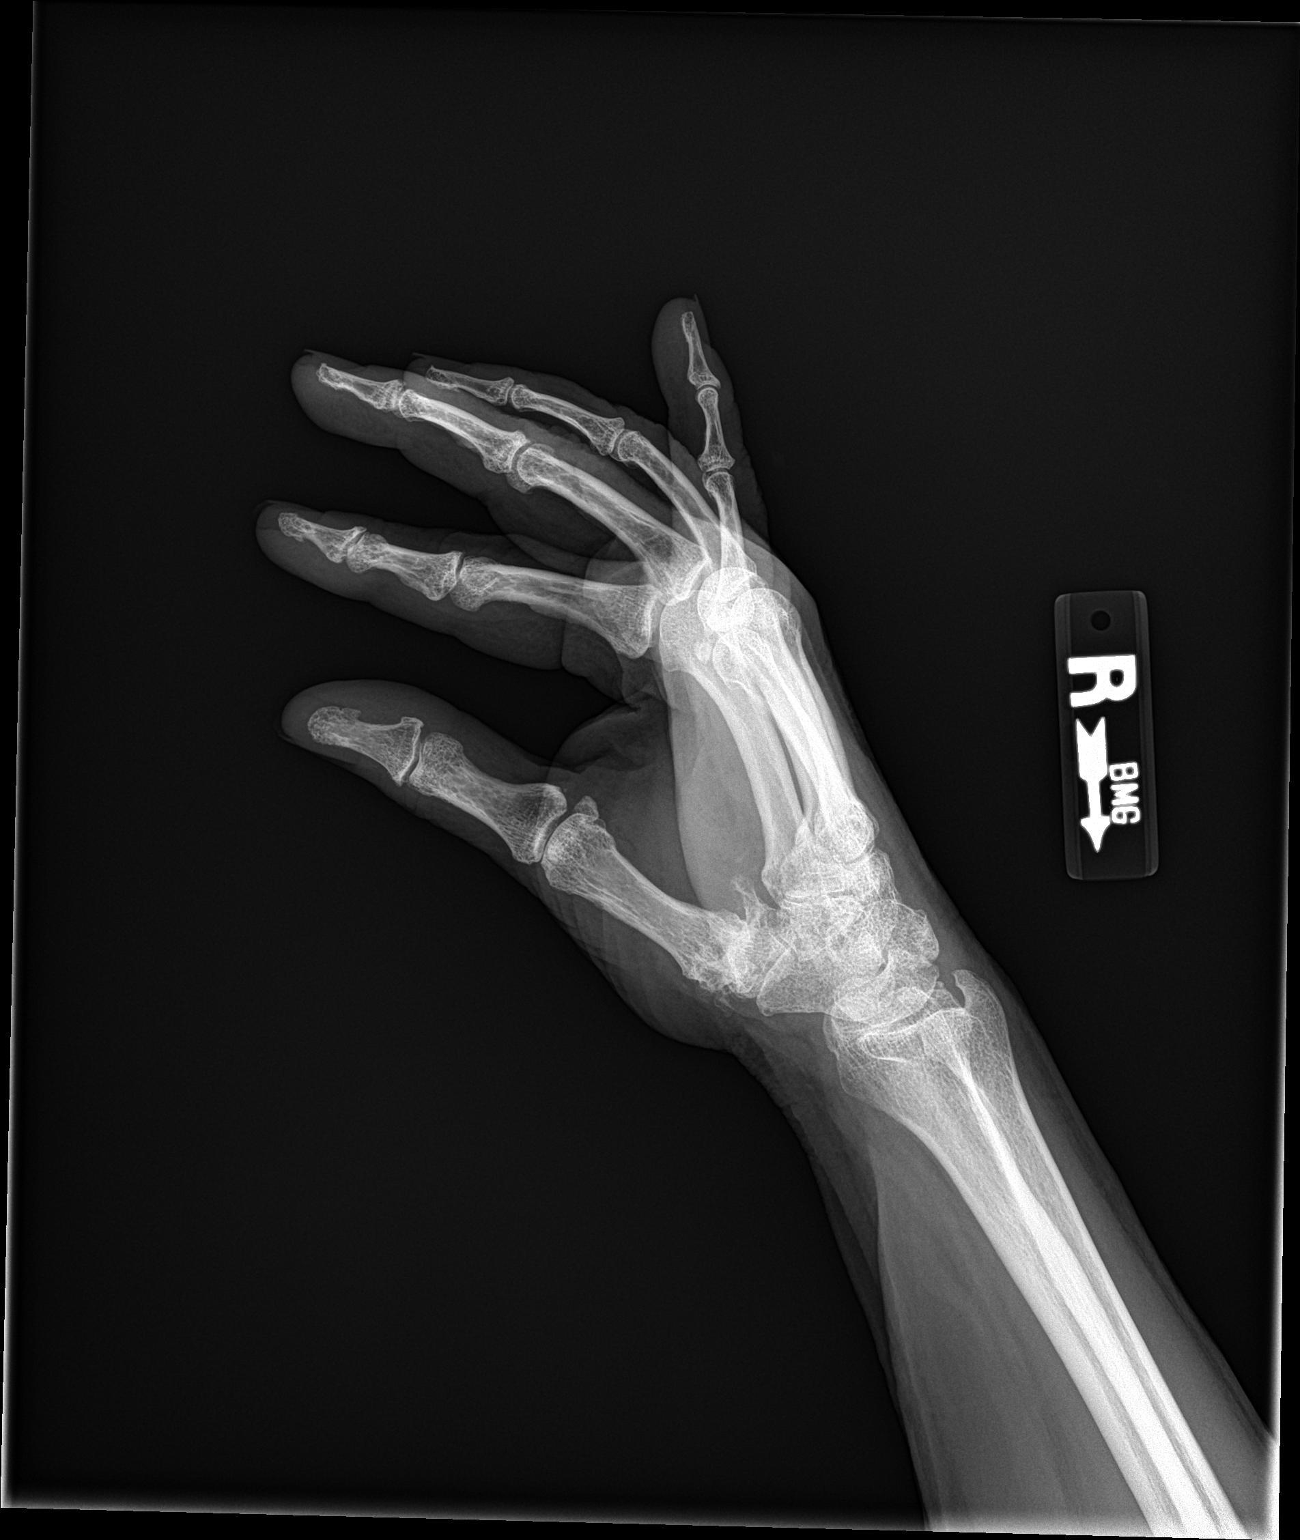

[3 of 3 positions shown; findings below may reference images not displayed]

FINDINGS: No acute bony or joint abnormality is identified. The patient has
mild to moderate multifocal interphalangeal joint osteoarthritis and
severe first CMC osteoarthritis. Soft tissues are negative.
IMPRESSION: Multifocal osteoarthritis.  Otherwise negative.

## 2020-12-05 ENCOUNTER — Ambulatory Visit: Payer: Medicare HMO | Admitting: Neurology

## 2020-12-05 ENCOUNTER — Other Ambulatory Visit: Payer: Self-pay

## 2020-12-05 DIAGNOSIS — M79605 Pain in left leg: Secondary | ICD-10-CM

## 2020-12-05 NOTE — Procedures (Signed)
[facility]  [address] [place], [address]  [place], [place] [address] Tel: [phone] Fax:  [phone] Test Date:  12/05/2020  Patient: Jasmine Long DOB: 04/13/1950 Physician: Nita Sickle, DO  Sex: Female Height: 5\' 9"  Ref Phys: , DO  ID#: Nita Sickle   Technician:    Patient Complaints: [name] referred for evaluation of left medial thigh pain and leg numbness.  NCV & EMG Findings: Extensive electrodiagnostic testing of the left lower extremity shows:  Left sural and superficial peroneal sensory responses are within normal limits. Left peroneal and tibial motor responses are within normal limits. Left tibial H reflex study is within normal limits. There is no evidence of active or chronic motor axonal loss changes affecting any of the tested muscles.  Motor unit configuration and recruitment pattern is within normal limits.  Impression: This is a normal study of the left lower extremity.  In particular, there is no evidence of a lumbosacral radiculopathy or sensorimotor polyneuropathy.   ___________________________ 73, DO    Nerve Conduction Studies Anti Sensory Summary Table   Stim Site NR Peak (ms) Norm Peak (ms) P-T Amp (V) Norm P-T Amp  Left Sup Peroneal Anti Sensory (Ant Lat Mall)  32C  12 cm    3.5 <4.6 6.5 >3  Left Sural Anti Sensory (Lat Mall)  32C  Calf    3.7 <4.6 5.1 >3   Motor Summary Table   Stim Site NR Onset (ms) Norm Onset (ms) O-P Amp (mV) Norm O-P Amp Site1 Site2 Delta-0 (ms) Dist (cm) Vel (m/s) Norm Vel (m/s)  Left Peroneal Motor (Ext Dig Brev)  32C  Ankle    3.9 <6.0 4.5 >2.5 B Fib Ankle 7.4 38.0 51 >40  B Fib    11.3  4.2  Poplt B Fib 1.7 8.0 47 >40  Poplt    13.0  4.0         Left Tibial Motor (Abd Hall Brev)  32C  Ankle    3.8 <6.0 12.2 >4 Knee Ankle 9.3 42.0 45 >40  Knee    13.1  7.2          H Reflex Studies   NR H-Lat (ms) Lat Norm (ms) L-R H-Lat (ms)  Left Tibial Run #2 (Gastroc)   32C     33.88 <35    EMG   Side Muscle Ins Act Fibs Psw Fasc Number Recrt Dur Dur. Amp Amp. Poly Poly. Comment  Left AntTibialis Nml Nml Nml Nml Nml Nml Nml Nml Nml Nml Nml Nml N/A  Left Gastroc Nml Nml Nml Nml Nml Nml Nml Nml Nml Nml Nml Nml N/A  Left Flex Dig Long Nml Nml Nml Nml Nml Nml Nml Nml Nml Nml Nml Nml N/A  Left RectFemoris Nml Nml Nml Nml Nml Nml Nml Nml Nml Nml Nml Nml N/A  Left AdductorLong Nml Nml Nml Nml Nml Nml Nml Nml Nml Nml Nml Nml N/A  Left GluteusMed Nml Nml Nml Nml Nml Nml Nml Nml Nml Nml Nml Nml N/A      Waveforms:

## 2020-12-11 ENCOUNTER — Other Ambulatory Visit: Payer: Self-pay | Admitting: Emergency Medicine

## 2020-12-11 DIAGNOSIS — F418 Other specified anxiety disorders: Secondary | ICD-10-CM

## 2020-12-26 ENCOUNTER — Ambulatory Visit (INDEPENDENT_AMBULATORY_CARE_PROVIDER_SITE_OTHER): Payer: Medicare HMO | Admitting: Emergency Medicine

## 2020-12-26 ENCOUNTER — Other Ambulatory Visit: Payer: Self-pay

## 2020-12-26 ENCOUNTER — Encounter: Payer: Self-pay | Admitting: Emergency Medicine

## 2020-12-26 VITALS — BP 130/70 | HR 89 | Ht 69.0 in | Wt 238.0 lb

## 2020-12-26 DIAGNOSIS — R0989 Other specified symptoms and signs involving the circulatory and respiratory systems: Secondary | ICD-10-CM | POA: Diagnosis not present

## 2020-12-26 DIAGNOSIS — M5442 Lumbago with sciatica, left side: Secondary | ICD-10-CM | POA: Diagnosis not present

## 2020-12-26 DIAGNOSIS — R262 Difficulty in walking, not elsewhere classified: Secondary | ICD-10-CM | POA: Diagnosis not present

## 2020-12-26 DIAGNOSIS — J410 Simple chronic bronchitis: Secondary | ICD-10-CM

## 2020-12-26 DIAGNOSIS — G8929 Other chronic pain: Secondary | ICD-10-CM | POA: Insufficient documentation

## 2020-12-26 DIAGNOSIS — J3489 Other specified disorders of nose and nasal sinuses: Secondary | ICD-10-CM | POA: Diagnosis not present

## 2020-12-26 DIAGNOSIS — M79605 Pain in left leg: Secondary | ICD-10-CM | POA: Diagnosis not present

## 2020-12-26 DIAGNOSIS — Z6841 Body Mass Index (BMI) 40.0 and over, adult: Secondary | ICD-10-CM

## 2020-12-26 MED ORDER — DOXYCYCLINE HYCLATE 100 MG PO TABS: 100.0000 mg | ORAL_TABLET | Freq: Two times a day (BID) | ORAL | 1 refills | Status: AC

## 2020-12-26 NOTE — Assessment & Plan Note (Signed)
Chronic and affecting quality of life.  Recent nerve conduction study of left leg was normal.  Needs lumbar spine imaging for further assessment of her symptoms. MRI of lumbar spine requested today.

## 2020-12-26 NOTE — Assessment & Plan Note (Signed)
Presenting with yellow discharge today.  Requesting prescription for doxycycline.

## 2020-12-26 NOTE — Progress Notes (Signed)
Jasmine Long 70 y.o.   Chief Complaint  Patient presents with   Knee Pain    L knee pain, pt states that pain is radaring down her leg. Pt want to disuss results from imaging.    HISTORY OF PRESENT ILLNESS: [name] complaining of persistent pain to left inner thigh area radiating down to her left knee. Has history of old Worker's Comp. injury with subsequent damage to her left leg. Had nerve conduction studies done in 2015 that showed pathology.  Patient has copy of report. Recent neurology evaluation and recent nerve conduction studies as follows: Patient Complaints: [name] referred for evaluation of left medial thigh pain and leg numbness.   NCV & EMG Findings: Extensive electrodiagnostic testing of the left lower extremity shows:  Left sural and superficial peroneal sensory responses are within normal limits. Left peroneal and tibial motor responses are within normal limits. Left tibial H reflex study is within normal limits. There is no evidence of active or chronic motor axonal loss changes affecting any of the tested muscles.  Motor unit configuration and recruitment pattern is within normal limits.   Impression: This is a normal study of the left lower extremity.  In particular, there is no evidence of a lumbosacral radiculopathy or sensorimotor polyneuropathy. Patient has history of chronic lumbar pain.  MRI ordered 1 year ago was never scheduled.  We will reschedule to assess lumbar spine possible pathology. Has history of chronic sinus problems.  Recent office visit with ENT Dr. Ezzard Standing as follows:  Assessment: Chronic rhinitis. History of allergic rhinitis. History of sinus infections but no clinical evidence of active sinus infection on exam today.   Plan: Recommended regular use of nasal steroid spray and prescribed Nasacort 2 sprays each nostril at night as this should help some with nasal congestion and sinus obstruction. Also  reviewed with her concerning use of saline rinses and suggested using the AYR or Xlear brands of the saline nasal rinses as needed postnasal drainage. Reassured her today no evidence of active sinus infection.  No intranasal polyps noted or obstructive lesions noted within the nasal cavity otherwise.     Jasmine Bonds, MD Knee Pain     Prior to Admission medications   Medication Sig Start Date End Date Taking? Authorizing Provider  ALPRAZolam Prudy Feeler) 0.5 MG tablet TAKE 1 TABLET BY MOUTH EVERY DAY AS NEEDED FOR ANXIETY 07/16/20  Yes Dulce Martian, Eilleen Kempf, MD  atorvastatin (LIPITOR) 20 MG tablet TAKE 1 TABLET BY MOUTH EVERY DAY 07/01/20  Yes [name], MD  buPROPion (WELLBUTRIN XL) 300 MG 24 hr tablet TAKE 1 TABLET BY MOUTH EVERY DAY 07/14/20  Yes Nylia Gavina, Eilleen Kempf, MD  clonazePAM (KLONOPIN) 1 MG tablet TAKE 1 TABLET BY MOUTH ONCE DAILY AT BEDTIME FOR SLEEP AND  ANXIETY 12/11/20  Yes [name], MD  levothyroxine (SYNTHROID) 125 MCG tablet TAKE 1 TABLET (125 MCG TOTAL) BY MOUTH DAILY BEFORE BREAKFAST. 10/17/19  Yes Neri Samek, Eilleen Kempf, MD  losartan (COZAAR) 100 MG tablet TAKE 1 TABLET BY MOUTH EVERY DAY 09/26/20  Yes [name], MD  metFORMIN (GLUCOPHAGE) 1000 MG tablet TAKE 1 TABLET BY MOUTH TWICE A DAY WITH MEALS 07/14/20  Yes Rosina Cressler, Eilleen Kempf, MD  metoprolol succinate (TOPROL-XL) 25 MG 24 hr tablet TAKE 1 TABLET BY MOUTH EVERY DAY 07/14/20  Yes Angell Honse, Eilleen Kempf, MD  nicotine (NICODERM CQ - DOSED IN MG/24 HOURS) 21 mg/24hr patch Place 1 patch (21  mg total) onto the skin daily. 04/20/18  Yes Corazon Nickolas, Eilleen Kempf, MD  triamcinolone (NASACORT) 55 MCG/ACT AERO nasal inhaler Place 2 sprays into the nose daily. 2 sprays each nostril at night when you go to bed 10/08/20  Yes Drema Halon, MD    No Known Allergies  Patient Active Problem List   Diagnosis Date Noted   Chronic anxiety 06/28/2019   History of peptic ulcer 04/30/2017   Hyperlipidemia  12/11/2015   Insomnia 12/11/2015   Panic attack as reaction to stress 12/11/2015   Current smoker 12/11/2015   Chronic sinusitis 12/11/2015   Dyslipidemia associated with type 2 diabetes mellitus (HCC) 05/15/2015   Hypothyroid 04/29/2011   BMI 40.0-44.9, adult (HCC) 04/29/2011   Depression with anxiety 04/29/2011   GERD (gastroesophageal reflux disease) 04/29/2011   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   Nicotine addiction 04/29/2011   Hypertension associated with diabetes (HCC) 04/29/2011   Murmur, cardiac 04/29/2011    Past Medical History:  Diagnosis Date   Allergy    Anxiety    Depression    Heart murmur    Hypertension    Thyroid disease    Ulcer     Past Surgical History:  Procedure Laterality Date   BREAST SURGERY     CESAREAN SECTION     CHOLECYSTECTOMY     NASAL SEPTUM SURGERY      Social History   Socioeconomic History   Marital status: Divorced    Spouse name: Not on file   Number of children: 2   Years of education: Not on file   Highest education level: Not on file  Occupational History   Occupation: Unemployed  Tobacco Use   Smoking status: Every Day    Packs/day: 1.50    Years: 40.00    Pack years: 60.00    Types: Cigarettes    Last attempt to quit: 01/20/2012    Years since quitting: 8.9   Smokeless tobacco: Never   Tobacco comments:    Pt doing E-cigarette as of 01/20/12  Vaping Use   Vaping Use: Former  Substance and Sexual Activity   Alcohol use: No   Drug use: No   Sexual activity: Never  Other Topics Concern   Not on file  Social History Narrative   Patient on worker's compensation for an injury sustained on the job 5 years ago.      Right handed and Left Handed       Lives in a two story home       One son deceased.    Social Determinants of Health   Financial Resource Strain: Not on file  Food Insecurity: Not on file  Transportation Needs: Not on file  Physical Activity: Not on file  Stress: Not on file  Social  Connections: Not on file  Intimate Partner Violence: Not on file    Family History  Problem Relation Age of Onset   Lung cancer Mother    Dementia Father    Atrial fibrillation Father      Review of Systems  Constitutional: Negative.  Negative for chills and fever.  HENT:  Positive for congestion and sinus pain. Negative for sore throat.        Sinus discharge, yellowish  Cardiovascular: Negative.  Negative for chest pain and palpitations.  Gastrointestinal: Negative.  Negative for abdominal pain, diarrhea, nausea and vomiting.  Genitourinary: Negative.   Musculoskeletal:  Positive for back pain.  Skin: Negative.  Negative for rash.  Neurological: Negative.  Negative  for dizziness and headaches.  All other systems reviewed and are negative.  Today's Vitals   12/26/20 1027  BP: (!) 148/86  Pulse: 89  SpO2: 98%  Weight: 238 lb (108 kg)  Height:  (1.753 m)   Body mass index is 35.15 kg/m.  Physical Exam Vitals reviewed.  Constitutional:      Appearance: Normal appearance.  HENT:     Head: Normocephalic.  Eyes:     Extraocular Movements: Extraocular movements intact.     Pupils: Pupils are equal, round, and reactive to light.  Cardiovascular:     Rate and Rhythm: Normal rate and regular rhythm.     Pulses: Normal pulses.     Heart sounds: Normal heart sounds.  Pulmonary:     Effort: Pulmonary effort is normal.     Breath sounds: Normal breath sounds.  Musculoskeletal:     Cervical back: No tenderness.  Lymphadenopathy:     Cervical: No cervical adenopathy.  Skin:    General: Skin is warm and dry.     Capillary Refill: Capillary refill takes less than 2 seconds.  Neurological:     General: No focal deficit present.     Mental Status: She is alert and oriented to person, place, and time.  Psychiatric:        Mood and Affect: Mood normal.        Behavior: Behavior normal.     ASSESSMENT & PLAN: Problem List Items Addressed This Visit        Respiratory   Smokers' cough (HCC)     Nervous and Auditory   Chronic bilateral low back pain with left-sided sciatica    Chronic and affecting quality of life.  Recent nerve conduction study of left leg was normal.  Needs lumbar spine imaging for further assessment of her symptoms. MRI of lumbar spine requested today.      Relevant Orders   MR Lumbar Spine Wo Contrast     Other   BMI 40.0-44.9, adult (HCC)   Left leg pain - Primary   Relevant Orders   MR Lumbar Spine Wo Contrast   Impaired ambulation   Relevant Orders   MR Lumbar Spine Wo Contrast   Chronic sinus complaints    Presenting with yellow discharge today.  Requesting prescription for doxycycline.      Other Visit Diagnoses     Sinus drainage       Relevant Medications   doxycycline (VIBRA-TABS) 100 MG tablet      Patient Instructions  Health Maintenance After Age 39 After age 36, you are at a higher risk for certain long-term diseases and infections as well as injuries from falls. Falls are a major cause of broken bones and head injuries in people who are older than age 62. Getting regular preventive care can help to keep you healthy and well. Preventive care includes getting regular testing and making lifestyle changes as recommended by your health care provider. Talk with your health care provider about: Which screenings and tests you should have. A screening is a test that checks for a disease when you have no symptoms. A diet and exercise plan that is right for you. What should I know about screenings and tests to prevent falls? Screening and testing are the best ways to find a health problem early. Early diagnosis and treatment give you the best chance of managing medical conditions that are common after age 76. Certain conditions and lifestyle choices may make you more likely to  have a fall. Your health care provider may recommend: Regular vision checks. Poor vision and conditions such as cataracts can make  you more likely to have a fall. If you wear glasses, make sure to get your prescription updated if your vision changes. Medicine review. Work with your health care provider to regularly review all of the medicines you are taking, including over-the-counter medicines. Ask your health care provider about any side effects that may make you more likely to have a fall. Tell your health care provider if any medicines that you take make you feel dizzy or sleepy. Strength and balance checks. Your health care provider may recommend certain tests to check your strength and balance while standing, walking, or changing positions. Foot health exam. Foot pain and numbness, as well as not wearing proper footwear, can make you more likely to have a fall. Screenings, including: Osteoporosis screening. Osteoporosis is a condition that causes the bones to get weaker and break more easily. Blood pressure screening. Blood pressure changes and medicines to control blood pressure can make you feel dizzy. Depression screening. You may be more likely to have a fall if you have a fear of falling, feel depressed, or feel unable to do activities that you used to do. Alcohol use screening. Using too much alcohol can affect your balance and may make you more likely to have a fall. Follow these instructions at home: Lifestyle Do not drink alcohol if: Your health care provider tells you not to drink. If you drink alcohol: Limit how much you have to: 0-1 drink a day for women. 0-2 drinks a day for men. Know how much alcohol is in your drink. In the [place] one drink equals one 12 oz bottle of beer (355 mL), one 5 oz glass of wine (148 mL), or one 1 oz glass of hard liquor (44 mL). Do not use any products that contain nicotine or tobacco. These products include cigarettes, chewing tobacco, and vaping devices, such as e-cigarettes. If you need help quitting, ask your health care provider. Activity  Follow a regular exercise  program to stay fit. This will help you maintain your balance. Ask your health care provider what types of exercise are appropriate for you. If you need a cane or walker, use it as recommended by your health care provider. Wear supportive shoes that have nonskid soles. Safety  Remove any tripping hazards, such as rugs, cords, and clutter. Install safety equipment such as grab bars in bathrooms and safety rails on stairs. Keep rooms and walkways well-lit. General instructions Talk with your health care provider about your risks for falling. Tell your health care provider if: You fall. Be sure to tell your health care provider about all falls, even ones that seem minor. You feel dizzy, tiredness (fatigue), or off-balance. Take over-the-counter and prescription medicines only as told by your health care provider. These include supplements. Eat a healthy diet and maintain a healthy weight. A healthy diet includes low-fat dairy products, low-fat (lean) meats, and fiber from whole grains, beans, and lots of fruits and vegetables. Stay current with your vaccines. Schedule regular health, dental, and eye exams. Summary Having a healthy lifestyle and getting preventive care can help to protect your health and wellness after age 76. Screening and testing are the best way to find a health problem early and help you avoid having a fall. Early diagnosis and treatment give you the best chance for managing medical conditions that are more common for people who are older  than age 93. Falls are a major cause of broken bones and head injuries in people who are older than age 36. Take precautions to prevent a fall at home. Work with your health care provider to learn what changes you can make to improve your health and wellness and to prevent falls. This information is not intended to replace advice given to you by your health care provider. Make sure you discuss any questions you have with your health care  provider. Document Revised: 06/03/2020 Document Reviewed: 06/03/2020 Elsevier Patient Education  2022 Elsevier Inc.     Edwina Barth, MD [facility]

## 2020-12-26 NOTE — Patient Instructions (Signed)
Health Maintenance After Age 68 After age 68, you are at a higher risk for certain long-term diseases and infections as well as injuries from falls. Falls are a major cause of broken bones and head injuries in people who are older than age 68. Getting regular preventive care can help to keep you healthy and well. Preventive care includes getting regular testing and making lifestyle changes as recommended by your health care provider. Talk with your health care provider about: Which screenings and tests you should have. A screening is a test that checks for a disease when you have no symptoms. A diet and exercise plan that is right for you. What should I know about screenings and tests to prevent falls? Screening and testing are the best ways to find a health problem early. Early diagnosis and treatment give you the best chance of managing medical conditions that are common after age 68. Certain conditions and lifestyle choices may make you more likely to have a fall. Your health care provider may recommend: Regular vision checks. Poor vision and conditions such as cataracts can make you more likely to have a fall. If you wear glasses, make sure to get your prescription updated if your vision changes. Medicine review. Work with your health care provider to regularly review all of the medicines you are taking, including over-the-counter medicines. Ask your health care provider about any side effects that may make you more likely to have a fall. Tell your health care provider if any medicines that you take make you feel dizzy or sleepy. Strength and balance checks. Your health care provider may recommend certain tests to check your strength and balance while standing, walking, or changing positions. Foot health exam. Foot pain and numbness, as well as not wearing proper footwear, can make you more likely to have a fall. Screenings, including: Osteoporosis screening. Osteoporosis is a condition that causes  the bones to get weaker and break more easily. Blood pressure screening. Blood pressure changes and medicines to control blood pressure can make you feel dizzy. Depression screening. You may be more likely to have a fall if you have a fear of falling, feel depressed, or feel unable to do activities that you used to do. Alcohol use screening. Using too much alcohol can affect your balance and may make you more likely to have a fall. Follow these instructions at home: Lifestyle Do not drink alcohol if: Your health care provider tells you not to drink. If you drink alcohol: Limit how much you have to: 0-1 drink a day for women. 0-2 drinks a day for men. Know how much alcohol is in your drink. In the [place] one drink equals one 12 oz bottle of beer (355 mL), one 5 oz glass of wine (148 mL), or one 1 oz glass of hard liquor (44 mL). Do not use any products that contain nicotine or tobacco. These products include cigarettes, chewing tobacco, and vaping devices, such as e-cigarettes. If you need help quitting, ask your health care provider. Activity  Follow a regular exercise program to stay fit. This will help you maintain your balance. Ask your health care provider what types of exercise are appropriate for you. If you need a cane or walker, use it as recommended by your health care provider. Wear supportive shoes that have nonskid soles. Safety  Remove any tripping hazards, such as rugs, cords, and clutter. Install safety equipment such as grab bars in bathrooms and safety rails on stairs. Keep rooms and walkways   well-lit. General instructions Talk with your health care provider about your risks for falling. Tell your health care provider if: You fall. Be sure to tell your health care provider about all falls, even ones that seem minor. You feel dizzy, tiredness (fatigue), or off-balance. Take over-the-counter and prescription medicines only as told by your health care provider. These include  supplements. Eat a healthy diet and maintain a healthy weight. A healthy diet includes low-fat dairy products, low-fat (lean) meats, and fiber from whole grains, beans, and lots of fruits and vegetables. Stay current with your vaccines. Schedule regular health, dental, and eye exams. Summary Having a healthy lifestyle and getting preventive care can help to protect your health and wellness after age 68. Screening and testing are the best way to find a health problem early and help you avoid having a fall. Early diagnosis and treatment give you the best chance for managing medical conditions that are more common for people who are older than age 68. Falls are a major cause of broken bones and head injuries in people who are older than age 68. Take precautions to prevent a fall at home. Work with your health care provider to learn what changes you can make to improve your health and wellness and to prevent falls. This information is not intended to replace advice given to you by your health care provider. Make sure you discuss any questions you have with your health care provider. Document Revised: 06/03/2020 Document Reviewed: 06/03/2020 Elsevier Patient Education  2022 Elsevier Inc.  

## 2021-01-02 ENCOUNTER — Telehealth: Payer: Self-pay

## 2021-01-02 DIAGNOSIS — G8929 Other chronic pain: Secondary | ICD-10-CM

## 2021-01-02 NOTE — Telephone Encounter (Signed)
Jasmine Long from referral states that the location for pt to have MR of spine is [facility].  Reordered with new location.  Corrie Dandy states that for all imaging scans the location is [facility].

## 2021-01-02 NOTE — Telephone Encounter (Signed)
Thank you :)

## 2021-01-06 ENCOUNTER — Other Ambulatory Visit: Payer: Self-pay | Admitting: Emergency Medicine

## 2021-01-06 DIAGNOSIS — I10 Essential (primary) hypertension: Secondary | ICD-10-CM

## 2021-02-03 ENCOUNTER — Other Ambulatory Visit: Payer: Medicare HMO

## 2021-02-14 ENCOUNTER — Telehealth: Payer: Self-pay | Admitting: Emergency Medicine

## 2021-02-14 DIAGNOSIS — E119 Type 2 diabetes mellitus without complications: Secondary | ICD-10-CM

## 2021-02-14 DIAGNOSIS — F418 Other specified anxiety disorders: Secondary | ICD-10-CM

## 2021-02-14 DIAGNOSIS — I10 Essential (primary) hypertension: Secondary | ICD-10-CM

## 2021-02-14 NOTE — Telephone Encounter (Signed)
1.Medication Requested: levothyroxine (SYNTHROID) 125 MCG tablet metFORMIN (GLUCOPHAGE) 1000 MG tablet metoprolol succinate (TOPROL-XL) 25 MG 24 hr tablet buPROPion (WELLBUTRIN XL) 300 MG 24 hr tablet   2. Pharmacy (Name, Street, [facility]): [facility] [address] [place], [place] - [address]  Phone:  [phone] Fax:  [phone]   3. On Med List: yes  4. Last Visit with PCP: 12.01.22  5. Next visit date with PCP: 01.23.23   Agent: Please be advised that RX refills may take up to 3 business days. We ask that you follow-up with your pharmacy.

## 2021-02-15 MED ORDER — BUPROPION HCL ER (XL) 300 MG PO TB24: 300.0000 mg | ORAL_TABLET | Freq: Every day | ORAL | 1 refills | Status: DC

## 2021-02-15 MED ORDER — METFORMIN HCL 1000 MG PO TABS: 1000.0000 mg | ORAL_TABLET | Freq: Two times a day (BID) | ORAL | 1 refills | Status: DC

## 2021-02-15 MED ORDER — METOPROLOL SUCCINATE ER 25 MG PO TB24: 25.0000 mg | ORAL_TABLET | Freq: Every day | ORAL | 1 refills | Status: DC

## 2021-02-17 ENCOUNTER — Other Ambulatory Visit: Payer: Self-pay

## 2021-02-17 ENCOUNTER — Encounter: Payer: Self-pay | Admitting: Emergency Medicine

## 2021-02-17 ENCOUNTER — Ambulatory Visit (INDEPENDENT_AMBULATORY_CARE_PROVIDER_SITE_OTHER): Payer: Medicare HMO | Admitting: Emergency Medicine

## 2021-02-17 VITALS — BP 130/70 | HR 100 | Ht 69.0 in | Wt 237.0 lb

## 2021-02-17 DIAGNOSIS — F418 Other specified anxiety disorders: Secondary | ICD-10-CM | POA: Diagnosis not present

## 2021-02-17 MED ORDER — ESCITALOPRAM OXALATE 10 MG PO TABS: 10.0000 mg | ORAL_TABLET | Freq: Every day | ORAL | 1 refills | Status: DC

## 2021-02-17 NOTE — Telephone Encounter (Signed)
Medication refilled 02/15/21. °

## 2021-02-17 NOTE — Patient Instructions (Signed)

## 2021-02-17 NOTE — Assessment & Plan Note (Signed)
Active and affecting quality of life.  Using alprazolam more than usual.  Anxiety attacks becoming overwhelming.  Not suicidal.  Needs psychiatric referral.  Presently taking Wellbutrin.  Needs additional antianxiety medication for generalized anxiety disorder.  We will try Lexapro 10 mg daily.  Continue alprazolam as needed.

## 2021-02-17 NOTE — Progress Notes (Signed)
Jasmine Long 71 y.o.   Chief Complaint  Patient presents with   Anxiety    HISTORY OF PRESENT ILLNESS: Acute problem visit today. This is a 71 y.o. female complaining of increased amount of anxiety over the past month. 12-year anniversary of passing of her son coming up in February 5.  There is always triggered her anxiety. Takes alprazolam as needed.  However this time around it has been overwhelming. Does not have psychiatrist.  Needs a referral. May need additional antianxiety daily maintenance medication.  HPI   Prior to Admission medications   Medication Sig Start Date End Date Taking? Authorizing Provider  ALPRAZolam Duanne Moron) 0.5 MG tablet TAKE 1 TABLET BY MOUTH EVERY DAY AS NEEDED FOR ANXIETY 07/16/20  Yes Zulma Court, Ines Bloomer, MD  atorvastatin (LIPITOR) 20 MG tablet TAKE 1 TABLET BY MOUTH EVERY DAY 07/01/20  Yes Horald Pollen, MD  buPROPion (WELLBUTRIN XL) 300 MG 24 hr tablet Take 1 tablet (300 mg total) by mouth daily. 02/15/21  Yes Darl Brisbin, Ines Bloomer, MD  clonazePAM (KLONOPIN) 1 MG tablet TAKE 1 TABLET BY MOUTH ONCE DAILY AT BEDTIME FOR SLEEP AND  ANXIETY 12/11/20  Yes Horald Pollen, MD  levothyroxine (SYNTHROID) 125 MCG tablet TAKE 1 TABLET (125 MCG TOTAL) BY MOUTH DAILY BEFORE BREAKFAST. 10/17/19  Yes Kathrene Sinopoli, Ines Bloomer, MD  losartan (COZAAR) 100 MG tablet Take 1 tablet by mouth once daily 01/06/21  Yes Christophor Eick, Ines Bloomer, MD  metFORMIN (GLUCOPHAGE) 1000 MG tablet Take 1 tablet (1,000 mg total) by mouth 2 (two) times daily with a meal. 02/15/21  Yes Jandi Swiger, Ines Bloomer, MD  metoprolol succinate (TOPROL-XL) 25 MG 24 hr tablet Take 1 tablet (25 mg total) by mouth daily. 02/15/21  Yes Seynabou Fults, Ines Bloomer, MD  nicotine (NICODERM CQ - DOSED IN MG/24 HOURS) 21 mg/24hr patch Place 1 patch (21 mg total) onto the skin daily. 04/20/18  Yes Nelsie Domino, Ines Bloomer, MD  triamcinolone (NASACORT) 55 MCG/ACT AERO nasal inhaler Place 2 sprays into the nose daily. 2 sprays  each nostril at night when you go to bed 10/08/20  Yes Rozetta Nunnery, MD    No Known Allergies  Patient Active Problem List   Diagnosis Date Noted   Smokers' cough ([place]) 12/26/2020   Left leg pain 12/26/2020   Chronic bilateral low back pain with left-sided sciatica 12/26/2020   Impaired ambulation 12/26/2020   Chronic sinus complaints 12/26/2020   Chronic anxiety 06/28/2019   History of peptic ulcer 04/30/2017   Hyperlipidemia 12/11/2015   Insomnia 12/11/2015   Panic attack as reaction to stress 12/11/2015   Current smoker 12/11/2015   Chronic sinusitis 12/11/2015   Dyslipidemia associated with type 2 diabetes mellitus ([place]) 05/15/2015   Hypothyroid 04/29/2011   BMI 40.0-44.9, adult ([place]) 04/29/2011   Depression with anxiety 04/29/2011   GERD (gastroesophageal reflux disease) 04/29/2011   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   Nicotine addiction 04/29/2011   Hypertension associated with diabetes ([place]) 04/29/2011   Murmur, cardiac 04/29/2011    Past Medical History:  Diagnosis Date   Allergy    Anxiety    Depression    Heart murmur    Hypertension    Thyroid disease    Ulcer     Past Surgical History:  Procedure Laterality Date   BREAST SURGERY     CESAREAN SECTION     CHOLECYSTECTOMY     NASAL SEPTUM SURGERY      Social History   Socioeconomic History   Marital status: Divorced  Spouse name: Not on file   Number of children: 2   Years of education: Not on file   Highest education level: Not on file  Occupational History   Occupation: Unemployed  Tobacco Use   Smoking status: Every Day    Packs/day: 1.50    Years: 40.00    Pack years: 60.00    Types: Cigarettes    Last attempt to quit: 01/20/2012    Years since quitting: 9.0   Smokeless tobacco: Never   Tobacco comments:    Pt doing E-cigarette as of 01/20/12  Vaping Use   Vaping Use: Former  Substance and Sexual Activity   Alcohol use: No   Drug use: No   Sexual activity: Never  Other  Topics Concern   Not on file  Social History Narrative   Patient on worker's compensation for an injury sustained on the job 5 years ago.      Right handed and Left Handed       Lives in a two story home       One son deceased.    Social Determinants of Health   Financial Resource Strain: Not on file  Food Insecurity: Not on file  Transportation Needs: Not on file  Physical Activity: Not on file  Stress: Not on file  Social Connections: Not on file  Intimate Partner Violence: Not on file    Family History  Problem Relation Age of Onset   Lung cancer Mother    Dementia Father    Atrial fibrillation Father      Review of Systems  Constitutional: Negative.  Negative for chills and fever.  HENT: Negative.  Negative for congestion and sore throat.   Eyes: Negative.   Respiratory: Negative.  Negative for cough and shortness of breath.   Cardiovascular: Negative.  Negative for chest pain and palpitations.  Gastrointestinal:  Negative for abdominal pain, nausea and vomiting.  Genitourinary: Negative.  Negative for dysuria and hematuria.  Skin: Negative.  Negative for rash.  Neurological: Negative.  Negative for dizziness and headaches.  Psychiatric/Behavioral:  Positive for depression. The patient is nervous/anxious.   All other systems reviewed and are negative.  Today's Vitals   02/17/21 1339  BP: (!) 162/82  Pulse: 100  Weight: 237 lb (107.5 kg)  Height: 5\' 9"  (1.753 m)   Body mass index is 35 kg/m.  Physical Exam Vitals reviewed.  Constitutional:      Appearance: Normal appearance.  HENT:     Head: Normocephalic.  Eyes:     Extraocular Movements: Extraocular movements intact.     Pupils: Pupils are equal, round, and reactive to light.  Cardiovascular:     Rate and Rhythm: Normal rate and regular rhythm.     Pulses: Normal pulses.     Heart sounds: Normal heart sounds.  Pulmonary:     Effort: Pulmonary effort is normal.     Breath sounds: Normal breath  sounds.  Musculoskeletal:     Cervical back: No tenderness.  Lymphadenopathy:     Cervical: No cervical adenopathy.  Skin:    General: Skin is warm and dry.  Neurological:     General: No focal deficit present.     Mental Status: She is alert and oriented to person, place, and time.  Psychiatric:        Behavior: Behavior normal.     ASSESSMENT & PLAN: Problem List Items Addressed This Visit       Other   Depression with anxiety -  Primary    Active and affecting quality of life.  Using alprazolam more than usual.  Anxiety attacks becoming overwhelming.  Not suicidal.  Needs psychiatric referral.  Presently taking Wellbutrin.  Needs additional antianxiety medication for generalized anxiety disorder.  We will try Lexapro 10 mg daily.  Continue alprazolam as needed.      Relevant Medications   escitalopram (LEXAPRO) 10 MG tablet   Other Relevant Orders   Ambulatory referral to Psychiatry   Patient Instructions  Generalized Anxiety Disorder, Adult Generalized anxiety disorder (GAD) is a mental health condition. Unlike normal worries, anxiety related to GAD is not triggered by a specific event. These worries do not fade or get better with time. GAD interferes with relationships, work, and school. GAD symptoms can vary from mild to severe. People with severe GAD can have intense waves of anxiety with physical symptoms that are similar to panic attacks. What are the causes? The exact cause of GAD is not known, but the following are believed to have an impact: Differences in natural brain chemicals. Genes passed down from parents to children. Differences in the way threats are perceived. Development and stress during childhood. Personality. What increases the risk? The following factors may make you more likely to develop this condition: Being female. Having a family history of anxiety disorders. Being very shy. Experiencing very stressful life events, such as the death of a  loved one. Having a very stressful family environment. What are the signs or symptoms? People with GAD often worry excessively about many things in their lives, such as their health and family. Symptoms may also include: Mental and emotional symptoms: Worrying excessively about natural disasters. Fear of being late. Difficulty concentrating. Fears that others are judging your performance. Physical symptoms: Fatigue. Headaches, muscle tension, muscle twitches, trembling, or feeling shaky. Feeling like your heart is pounding or beating very fast. Feeling out of breath or like you cannot take a deep breath. Having trouble falling asleep or staying asleep, or experiencing restlessness. Sweating. Nausea, diarrhea, or irritable bowel syndrome (IBS). Behavioral symptoms: Experiencing erratic moods or irritability. Avoidance of new situations. Avoidance of people. Extreme difficulty making decisions. How is this diagnosed? This condition is diagnosed based on your symptoms and medical history. You will also have a physical exam. Your health care provider may perform tests to rule out other possible causes of your symptoms. To be diagnosed with GAD, a person must have anxiety that: Is out of his or her control. Affects several different aspects of his or her life, such as work and relationships. Causes distress that makes him or her unable to take part in normal activities. Includes at least three symptoms of GAD, such as restlessness, fatigue, trouble concentrating, irritability, muscle tension, or sleep problems. Before your health care provider can confirm a diagnosis of GAD, these symptoms must be present more days than they are not, and they must last for 6 months or longer. How is this treated? This condition may be treated with: Medicine. Antidepressant medicine is usually prescribed for long-term daily control. Anti-anxiety medicines may be added in severe cases, especially when panic  attacks occur. Talk therapy (psychotherapy). Certain types of talk therapy can be helpful in treating GAD by providing support, education, and guidance. Options include: Cognitive behavioral therapy (CBT). People learn coping skills and self-calming techniques to ease their physical symptoms. They learn to identify unrealistic thoughts and behaviors and to replace them with more appropriate thoughts and behaviors. Acceptance and commitment therapy (ACT). This treatment  teaches people how to be mindful as a way to cope with unwanted thoughts and feelings. Biofeedback. This process trains you to manage your body's response (physiological response) through breathing techniques and relaxation methods. You will work with a therapist while machines are used to monitor your physical symptoms. Stress management techniques. These include yoga, meditation, and exercise. A mental health specialist can help determine which treatment is best for you. Some people see improvement with one type of therapy. However, other people require a combination of therapies. Follow these instructions at home: Lifestyle Maintain a consistent routine and schedule. Anticipate stressful situations. Create a plan and allow extra time to work with your plan. Practice stress management or self-calming techniques that you have learned from your therapist or your health care provider. Exercise regularly and spend time outdoors. Eat a healthy diet that includes plenty of vegetables, fruits, whole grains, low-fat dairy products, and lean protein. Do not eat a lot of foods that are high in fat, added sugar, or salt (sodium). Drink plenty of water. Avoid alcohol. Alcohol can increase anxiety. Avoid caffeine and certain over-the-counter cold medicines. These may make you feel worse. Ask your pharmacist which medicines to avoid. General instructions Take over-the-counter and prescription medicines only as told by your health care  provider. Understand that you are likely to have setbacks. Accept this and be kind to yourself as you persist to take better care of yourself. Anticipate stressful situations. Create a plan and allow extra time to work with your plan. Recognize and accept your accomplishments, even if you judge them as small. Spend time with people who care about you. Keep all follow-up visits. This is important. Where to find more information [place]: https://carter.com/ Substance Abuse and Mental Health Services: ktimeonline.com Contact a health care provider if: Your symptoms do not get better. Your symptoms get worse. You have signs of depression, such as: A persistently sad or irritable mood. Loss of enjoyment in activities that used to bring you joy. Change in weight or eating. Changes in sleeping habits. Get help right away if: You have thoughts about hurting yourself or others. If you ever feel like you may hurt yourself or others, or have thoughts about taking your own life, get help right away. Go to your nearest emergency department or: Call your local emergency services (911 in the [place] Call a suicide crisis helpline, such as the [place] at (458)332-1290 or 988 in the [place]. This is open 24 hours a day in the [place] Text the Crisis Text Line at (540)338-3737 (in the [place] Summary Generalized anxiety disorder (GAD) is a mental health condition that involves worry that is not triggered by a specific event. People with GAD often worry excessively about many things in their lives, such as their health and family. GAD may cause symptoms such as restlessness, trouble concentrating, sleep problems, frequent sweating, nausea, diarrhea, headaches, and trembling or muscle twitching. A mental health specialist can help determine which treatment is best for you. Some people see improvement with one type of therapy. However, other people require a combination of  therapies. This information is not intended to replace advice given to you by your health care provider. Make sure you discuss any questions you have with your health care provider. Document Revised: 08/07/2020 Document Reviewed: 05/05/2020 Elsevier Patient Education  2022 [place], MD [facility]

## 2021-03-06 NOTE — Telephone Encounter (Signed)
Erroneous encounter. Please disregard.

## 2021-03-07 ENCOUNTER — Other Ambulatory Visit: Payer: Self-pay

## 2021-03-07 ENCOUNTER — Telehealth: Payer: Self-pay

## 2021-03-07 DIAGNOSIS — E079 Disorder of thyroid, unspecified: Secondary | ICD-10-CM

## 2021-03-07 MED ORDER — LEVOTHYROXINE SODIUM 125 MCG PO TABS: 125.0000 ug | ORAL_TABLET | Freq: Every day | ORAL | 2 refills | Status: DC

## 2021-03-07 NOTE — Progress Notes (Signed)
Refilled medication

## 2021-03-07 NOTE — Telephone Encounter (Signed)
Pt requesting referral to [facility] at [employer]  Pt states location does accept her insurance  Fax [phone]  Phone [phone]  *see below*

## 2021-03-07 NOTE — Telephone Encounter (Signed)
Pt is requesting a referral for psych treatment.   Pt CB [phone]  Behavioral health urgent care is only gives referral number and no one is returning call  Pt oldest son destroyed youngest sons ashes and has raised her anxiety tremendously.  Pt says she will continue to try and reach out to therapist from [facility] but so far unsuccessful.

## 2021-03-11 ENCOUNTER — Other Ambulatory Visit: Payer: Self-pay | Admitting: Emergency Medicine

## 2021-03-11 DIAGNOSIS — F418 Other specified anxiety disorders: Secondary | ICD-10-CM

## 2021-03-11 NOTE — Telephone Encounter (Signed)
Referral placed as requested. Thanks

## 2021-03-11 NOTE — Telephone Encounter (Signed)
Called pt, no answer and no vm set up.

## 2021-03-19 ENCOUNTER — Other Ambulatory Visit: Payer: Self-pay

## 2021-03-19 ENCOUNTER — Encounter: Payer: Self-pay | Admitting: Emergency Medicine

## 2021-03-19 ENCOUNTER — Ambulatory Visit (INDEPENDENT_AMBULATORY_CARE_PROVIDER_SITE_OTHER): Payer: Medicare HMO | Admitting: Emergency Medicine

## 2021-03-19 VITALS — BP 164/80 | HR 84 | Ht 69.0 in | Wt 237.0 lb

## 2021-03-19 DIAGNOSIS — J329 Chronic sinusitis, unspecified: Secondary | ICD-10-CM

## 2021-03-19 DIAGNOSIS — E1159 Type 2 diabetes mellitus with other circulatory complications: Secondary | ICD-10-CM

## 2021-03-19 DIAGNOSIS — F418 Other specified anxiety disorders: Secondary | ICD-10-CM | POA: Diagnosis not present

## 2021-03-19 DIAGNOSIS — R300 Dysuria: Secondary | ICD-10-CM | POA: Diagnosis not present

## 2021-03-19 DIAGNOSIS — J410 Simple chronic bronchitis: Secondary | ICD-10-CM | POA: Diagnosis not present

## 2021-03-19 DIAGNOSIS — I152 Hypertension secondary to endocrine disorders: Secondary | ICD-10-CM

## 2021-03-19 DIAGNOSIS — Z6841 Body Mass Index (BMI) 40.0 and over, adult: Secondary | ICD-10-CM

## 2021-03-19 DIAGNOSIS — I10 Essential (primary) hypertension: Secondary | ICD-10-CM

## 2021-03-19 DIAGNOSIS — F4323 Adjustment disorder with mixed anxiety and depressed mood: Secondary | ICD-10-CM

## 2021-03-19 DIAGNOSIS — F17200 Nicotine dependence, unspecified, uncomplicated: Secondary | ICD-10-CM | POA: Diagnosis not present

## 2021-03-19 LAB — POCT URINALYSIS DIP (MANUAL ENTRY)
Bilirubin, UA: NEGATIVE
Glucose, UA: NEGATIVE mg/dL
Ketones, POC UA: NEGATIVE mg/dL
Nitrite, UA: NEGATIVE
Protein Ur, POC: NEGATIVE mg/dL
Spec Grav, UA: 1.015 (ref 1.010–1.025)
Urobilinogen, UA: 0.2 E.U./dL
pH, UA: 6 (ref 5.0–8.0)

## 2021-03-19 MED ORDER — CLONAZEPAM 1 MG PO TABS: ORAL_TABLET | ORAL | 1 refills | Status: DC

## 2021-03-19 MED ORDER — ALPRAZOLAM 0.5 MG PO TABS: ORAL_TABLET | ORAL | 1 refills | Status: DC

## 2021-03-19 MED ORDER — VALSARTAN-HYDROCHLOROTHIAZIDE 160-12.5 MG PO TABS: 1.0000 | ORAL_TABLET | Freq: Every day | ORAL | 3 refills | Status: DC

## 2021-03-19 NOTE — Assessment & Plan Note (Signed)
Blood pressures readings have been elevated here and at home. Continue metoprolol succinate 25 mg daily. Stop losartan and start valsartan-HCTZ 160-12.5 mg daily. Advised to monitor blood pressure readings at home daily for the next several weeks and keep a log. Dietary approaches to stop hypertension discussed.

## 2021-03-19 NOTE — Assessment & Plan Note (Signed)
No signs of active bacterial infection.

## 2021-03-19 NOTE — Assessment & Plan Note (Signed)
Presently active and affecting quality of life. We will refill prescriptions for Klonopin and alprazolam. Continue Lexapro and Wellbutrin. Has appointment with psychiatrist next month.

## 2021-03-19 NOTE — Assessment & Plan Note (Signed)
Cardiovascular and cancer risk associated with smoking discussed once again.  Patient not ready to quit smoking.  Has been smoking more due to stressors. Smoking cessation advice given.
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
## 2021-03-19 NOTE — Patient Instructions (Signed)
Managing Anxiety, Adult ?After being diagnosed with anxiety, you may be relieved to know why you have felt or behaved a certain way. You may also feel overwhelmed about the treatment ahead and what it will mean for your life. With care and support, you can manage this condition. ?How to manage lifestyle changes ?Managing stress and anxiety ?Stress is your body's reaction to life changes and events, both good and bad. Most stress will last just a few hours, but stress can be ongoing and can lead to more than just stress. Although stress can play a major role in anxiety, it is not the same as anxiety. Stress is usually caused by something external, such as a deadline, test, or competition. Stress normally passes after the triggering event has ended.  ?Anxiety is caused by something internal, such as imagining a terrible outcome or worrying that something will go wrong that will devastate you. Anxiety often does not go away even after the triggering event is over, and it can become long-term (chronic) worry. It is important to understand the differences between stress and anxiety and to manage your stress effectively so that it does not lead to an anxious response. ?Talk with your health care provider or a counselor to learn more about reducing anxiety and stress. He or she may suggest tension reduction techniques, such as: ?Music therapy. Spend time creating or listening to music that you enjoy and that inspires you. ?Mindfulness-based meditation. Practice being aware of your normal breaths while not trying to control your breathing. It can be done while sitting or walking. ?Centering prayer. This involves focusing on a word, phrase, or sacred image that means something to you and brings you peace. ?Deep breathing. To do this, expand your stomach and inhale slowly through your nose. Hold your breath for 3-5 seconds. Then exhale slowly, letting your stomach muscles relax. ?Self-talk. Learn to notice and identify  thought patterns that lead to anxiety reactions and change those patterns to thoughts that feel peaceful. ?Muscle relaxation. Taking time to tense muscles and then relax them. ?Choose a tension reduction technique that fits your lifestyle and personality. These techniques take time and practice. Set aside 5-15 minutes a day to do them. Therapists can offer counseling and training in these techniques. The training to help with anxiety may be covered by some insurance plans. ?Other things you can do to manage stress and anxiety include: ?Keeping a stress diary. This can help you learn what triggers your reaction and then learn ways to manage your response. ?Thinking about how you react to certain situations. You may not be able to control everything, but you can control your response. ?Making time for activities that help you relax and not feeling guilty about spending your time in this way. ?Doing visual imagery. This involves imagining or creating mental pictures to help you relax. ?Practicing yoga. Through yoga poses, you can lower tension and promote relaxation. ? ?Medicines ?Medicines can help ease symptoms. Medicines for anxiety include: ?Antidepressant medicines. These are usually prescribed for long-term daily control. ?Anti-anxiety medicines. These may be added in severe cases, especially when panic attacks occur. ?Medicines will be prescribed by a health care provider. When used together, medicines, psychotherapy, and tension reduction techniques may be the most effective treatment. ?Relationships ?Relationships can play a big part in helping you recover. Try to spend more time connecting with trusted friends and family members. ?Consider going to couples counseling if you have a partner, taking family education classes, or going to family   therapy. ?Therapy can help you and others better understand your condition. ?How to recognize changes in your anxiety ?Everyone responds differently to treatment for  anxiety. Recovery from anxiety happens when symptoms decrease and stop interfering with your daily activities at home or work. This may mean that you will start to: ?Have better concentration and focus. Worry will interfere less in your daily thinking. ?Sleep better. ?Be less irritable. ?Have more energy. ?Have improved memory. ?It is also important to recognize when your condition is getting worse. Contact your health care provider if your symptoms interfere with home or work and you feel like your condition is not improving. ?Follow these instructions at home: ?Activity ?Exercise. Adults should do the following: ?Exercise for at least 150 minutes each week. The exercise should increase your heart rate and make you sweat (moderate-intensity exercise). ?Strengthening exercises at least twice a week. ?Get the right amount and quality of sleep. Most adults need 7-9 hours of sleep each night. ?Lifestyle ? ?Eat a healthy diet that includes plenty of vegetables, fruits, whole grains, low-fat dairy products, and lean protein. ?Do not eat a lot of foods that are high in fats, added sugars, or salt (sodium). ?Make choices that simplify your life. ?Do not use any products that contain nicotine or tobacco. These products include cigarettes, chewing tobacco, and vaping devices, such as e-cigarettes. If you need help quitting, ask your health care provider. ?Avoid caffeine, alcohol, and certain over-the-counter cold medicines. These may make you feel worse. Ask your pharmacist which medicines to avoid. ?General instructions ?Take over-the-counter and prescription medicines only as told by your health care provider. ?Keep all follow-up visits. This is important. ?Where to find support ?You can get help and support from these sources: ?Self-help groups. ?Online and community organizations. ?A trusted spiritual leader. ?Couples counseling. ?Family education classes. ?Family therapy. ?Where to find more information ?You may find  that joining a support group helps you deal with your anxiety. The following sources can help you locate counselors or support groups near you: ?Mental Health America: www.mentalhealthamerica.net ?Anxiety and Depression Association of America (ADAA): www.adaa.org ?National Alliance on Mental Illness (NAMI): www.nami.org ?Contact a health care provider if: ?You have a hard time staying focused or finishing daily tasks. ?You spend many hours a day feeling worried about everyday life. ?You become exhausted by worry. ?You start to have headaches or frequently feel tense. ?You develop chronic nausea or diarrhea. ?Get help right away if: ?You have a racing heart and shortness of breath. ?You have thoughts of hurting yourself or others. ?If you ever feel like you may hurt yourself or others, or have thoughts about taking your own life, get help right away. Go to your nearest emergency department or: ?Call your local emergency services (911 in the [place] ?Call a suicide crisis helpline, such as the National Suicide Prevention Lifeline at 1-800-273-8255 or 988 in the [place] This is open 24 hours a day in the [place] ?Text the Crisis Text Line at 741741 (in the [place] ?Summary ?Taking steps to learn and use tension reduction techniques can help calm you and help prevent triggering an anxiety reaction. ?When used together, medicines, psychotherapy, and tension reduction techniques may be the most effective treatment. ?Family, friends, and partners can play a big part in supporting you. ?This information is not intended to replace advice given to you by your health care provider. Make sure you discuss any questions you have with your health care provider. ?Document Revised: 08/07/2020 Document Reviewed: 05/05/2020 ?Elsevier Patient   Education ? 2022 Elsevier Inc. ? ?

## 2021-03-22 LAB — URINE CULTURE

## 2021-03-23 ENCOUNTER — Other Ambulatory Visit: Payer: Self-pay | Admitting: Emergency Medicine

## 2021-03-23 DIAGNOSIS — B9620 Unspecified Escherichia coli [E. coli] as the cause of diseases classified elsewhere: Secondary | ICD-10-CM

## 2021-03-23 MED ORDER — CEFUROXIME AXETIL 500 MG PO TABS: 500.0000 mg | ORAL_TABLET | Freq: Two times a day (BID) | ORAL | 0 refills | Status: AC

## 2021-04-03 ENCOUNTER — Ambulatory Visit: Payer: Medicare HMO | Admitting: Psychology

## 2021-04-15 ENCOUNTER — Ambulatory Visit (INDEPENDENT_AMBULATORY_CARE_PROVIDER_SITE_OTHER): Payer: Medicare HMO | Admitting: Psychology

## 2021-04-15 ENCOUNTER — Ambulatory Visit: Payer: Medicare HMO | Admitting: Psychology

## 2021-04-15 ENCOUNTER — Other Ambulatory Visit: Payer: Self-pay

## 2021-04-15 DIAGNOSIS — F419 Anxiety disorder, unspecified: Secondary | ICD-10-CM

## 2021-04-15 DIAGNOSIS — F4323 Adjustment disorder with mixed anxiety and depressed mood: Secondary | ICD-10-CM

## 2021-04-15 NOTE — Progress Notes (Signed)
Jasmine Long Initial Adult Exam ? ?Name: Jasmine Long ?Date: 04/15/2021 ?MRN: [id number] ?DOB: 08-20-1950 ?PCP: Jasmine Quint, MD ? ?Time Spent: 10:05  am - 10:55 am : 50 Minutes ? ?Guardian/Payee:  self   ? ?Paperwork requested: No  ? ?Reason for Visit /Presenting Problem: depression and anxiety ? ?Mental Status Exam: ?Appearance:   Neat     ?Behavior:  Appropriate  ?Motor:  Normal  ?Speech/Language:   Clear and Coherent  ?Affect:  Congruent  ?Mood:  dysthymic  ?Thought process:  normal  ?Thought content:    WNL  ?Sensory/Perceptual disturbances:    WNL  ?Orientation:  oriented to person, place, time/date, and situation  ?Attention:  Good  ?Concentration:  Good  ?Memory:  WNL  ?Fund of knowledge:   Good  ?Insight:    Good  ?Judgment:   Good  ?Impulse Control:  Good  ? ?Reported Symptoms:  depression. ? ?Risk Assessment: ?Danger to Self:  No ?Self-injurious Behavior: No ?Danger to Others: No ?Duty to Warn:no ?Physical Aggression / Violence:No  ?Access to Firearms a concern: No  ?Gang Involvement:No  ?Patient / guardian was educated about steps to take if suicide or homicide risk level increases between visits: no ?While future psychiatric events cannot be accurately predicted, the patient does not currently require acute inpatient psychiatric care and does not currently meet [facility] involuntary commitment criteria. ? ?Substance Abuse History: ?Current substance abuse: No    ?Caffeine: 150-175 oz of tea (unsweetened). She does not drink water often.  ?Tobacco: recently 3-4 packs a day during the start of this year.  ?Alcohol: None ?Substance use: none ? ?Past Psychiatric History:   ?Previous psychological history is significant for ADHD and depression ?Outpatient Providers:grief counseling. Psychiatric treatment with including EMDR. Treatment with Dr. Evalina Long [facility]) ?History of Psych Hospitalization: No  ?Psychological Testing:  n/a   ? ?Abuse History:  ?Victim of: No.,  n/a     ?Report needed: No. ?Victim of Neglect:No. ?Perpetrator of  n/a   ?Witness / Exposure to Domestic Violence: No   ?Protective Services Involvement: No  ?Witness to [employer] Violence:  No  ? ?Family History:  ?Family History  ?Problem Relation Age of Onset  ? Lung cancer Mother   ? Dementia Father   ? Atrial fibrillation Father   ? ? ?Living situation: the patient lives with their son ? ?Sexual Orientation: Straight ? ?Relationship Status: divorced  ?Name of spouse / other:n/a ?If a parent, number of children / ages: Jasmine Long (8), Jasmine Long (deceased) ? ?Support Systems: friends  ? ?Financial Stress:  Yes , "lately" due to dealing with her son.  ? ?Income/Employment/Disability: retired.  ? ?Military Service: No  ? ?Educational History: ?Education: college graduate ? ?Religion/Sprituality/World View: ?Presbyterian  ? ?Any cultural differences that may affect / interfere with treatment:  not applicable  ? ?Recreation/Hobbies: Going to [employer], dinner with friends,  ? ?Stressors: Financial difficulties   ?Health problems   ?Loss of son (murdered)   ?Marital or family conflict   ? ?Strengths: Friends, Spirituality, Hopefulness, Journalist, newspaper, and Able to Communicate Effectively ? ?Barriers:  Son's significant mental health issues including binge drinking.   ? ?Legal History: ?Pending legal issue / charges: The patient has no significant history of legal issues. ?History of legal issue / charges:  n/a ? ?Medical History/Surgical History: reviewed ?Past Medical History:  ?Diagnosis Date  ? Allergy   ? Anxiety   ? Depression   ? Heart murmur   ? Hypertension   ?  Thyroid disease   ? Ulcer   ? ? ?Past Surgical History:  ?Procedure Laterality Date  ? BREAST SURGERY    ? CESAREAN SECTION    ? CHOLECYSTECTOMY    ? NASAL SEPTUM SURGERY    ? ? ?Medications: ?Current Outpatient Medications  ?Medication Sig Dispense Refill  ? ALPRAZolam (XANAX) 0.5 MG tablet TAKE 1 TABLET BY MOUTH EVERY DAY AS NEEDED FOR ANXIETY 30 tablet 1  ?  atorvastatin (LIPITOR) 20 MG tablet TAKE 1 TABLET BY MOUTH EVERY DAY 90 tablet 3  ? buPROPion (WELLBUTRIN XL) 300 MG 24 hr tablet Take 1 tablet (300 mg total) by mouth daily. 90 tablet 1  ? clonazePAM (KLONOPIN) 1 MG tablet TAKE 1 TABLET BY MOUTH ONCE DAILY AT BEDTIME FOR SLEEP AND  ANXIETY 30 tablet 1  ? escitalopram (LEXAPRO) 10 MG tablet Take 1 tablet (10 mg total) by mouth daily. 90 tablet 1  ? levothyroxine (SYNTHROID) 125 MCG tablet Take 1 tablet (125 mcg total) by mouth daily before breakfast. 90 tablet 2  ? metFORMIN (GLUCOPHAGE) 1000 MG tablet Take 1 tablet (1,000 mg total) by mouth 2 (two) times daily with a meal. 180 tablet 1  ? metoprolol succinate (TOPROL-XL) 25 MG 24 hr tablet Take 1 tablet (25 mg total) by mouth daily. 90 tablet 1  ? nicotine (NICODERM CQ - DOSED IN MG/24 HOURS) 21 mg/24hr patch Place 1 patch (21 mg total) onto the skin daily. 28 patch 3  ? triamcinolone (NASACORT) 55 MCG/ACT AERO nasal inhaler Place 2 sprays into the nose daily. 2 sprays each nostril at night when you go to bed 1 each 12  ? valsartan-hydrochlorothiazide (DIOVAN-HCT) 160-12.5 MG tablet Take 1 tablet by mouth daily. 90 tablet 3  ? ?No current facility-administered medications for this visit.  ? ? ?No Known Allergies ? ?Diagnoses:  ?Situational mixed anxiety and depressive disorder ? ?Anxiety disorder, unspecified type ? ?Plan of Care: Outpatient Therapy and possible psychiatric treatment.  ? ?Narrative: ?Jasmine Long was referred to counseling by her primary care physician due to depression and anxiety.  We reviewed confidentiality prior to this starting of the evaluation.  Jasmine Long expressed understanding and consented to treatment.  She was referred due to numerous symptoms of depression and anxiety including panic.  Jasmine Long experienced the loss of her son, Jasmine Long, in February 2011 due to murder.  She noted her father passing less than 1 year later.  Jasmine Long currently lives with her other son Jasmine Long with significant mental health issues  including PTSD, schizophrenia, and his narcissistic personality disorder and has a history of numerous psychiatric hospitalizations most recently at [facility].  Additionally her son struggles with sporadic binge drinking.  He refuses treatment and refuses to maintain a medication regimen to manage his symptoms including his significant symptoms of schizophrenia.  Gunda noted a rise in her overall anxiety due to her son's unpredictable behavior and the need to participate in therapy to address these concerns.  She has a history of counseling with Dr. Evalina Long with [facility] who has since retired.  Records will be requested upon the completion of a release of information form.  She noted having to involuntarily commit her son on numerous occasions due to his behavior.  Her chart reflects history of anxiety, depression, panic.  She consumes a significant amount of caffeine daily and smokes a significant amount of Cigarettes daily.  Although psychoeducation was provided regarding caffeine intake and the propensity for experiencing additional anxiety and panic she was  reluctant to reduce her caffeine intake and acknowledged that caffeine is not the cause of her overall anxiety.  She endorsed high anxiety, poor sleep, interpersonal stressors, difficulty managing her overall symptoms.  She would possibly benefit from psychiatric consult although she currently receives her psychiatric medications through her primary care physician.  She denied any SI or history of along with any psychiatric hospitalizations.  She is currently a smoker of 2 to 3-1/2 packs/day and will be counseled regarding the effects of nicotine on anxiety and overall mood.  She would benefit from consistent counseling to address her mood, bolster coping skills, build boundaries for self and others, and overall manage her symptoms.  She presented as intelligent, forthcoming, and motivated to change with the exception of her  caffeine intake and possibly her nicotine intake.  Numerous scheduled follow-ups were created prior to end of the initial evaluation. ? ?Delight Ovens, LCSW  ? ? ?

## 2021-05-05 ENCOUNTER — Ambulatory Visit (INDEPENDENT_AMBULATORY_CARE_PROVIDER_SITE_OTHER): Payer: Medicare HMO

## 2021-05-05 DIAGNOSIS — Z0000 Encounter for general adult medical examination without abnormal findings: Secondary | ICD-10-CM

## 2021-05-05 NOTE — Progress Notes (Signed)
? ?Subjective:  ? Jasmine Long is a 71 y.o. female who presents for Medicare Annual (Subsequent) preventive examination. ? ?I connected with Jasmine Long today by telephone and verified that I am speaking with the correct person using two identifiers. ?Location patient: home ?Location provider: work ?Persons participating in the virtual visit: patient, provider. ?  ?I discussed the limitations, risks, security and privacy concerns of performing an evaluation and management service by telephone and the availability of in person appointments. I also discussed with the patient that there may be a patient responsible charge related to this service. The patient expressed understanding and verbally consented to this telephonic visit.  ?  ?Interactive audio and video telecommunications were attempted between this provider and patient, however failed, due to patient having technical difficulties OR patient did not have access to video capability.  We continued and completed visit with audio only. ? ?  ?Review of Systems    ? ?Cardiac Risk Factors include: advanced age (>50men, >80 women);hypertension;dyslipidemia ? ?   ?Objective:  ?  ?Today's Vitals  ? ?There is no height or weight on file to calculate BMI. ? ? ?  05/05/2021  ? 10:51 AM 11/04/2020  ?  1:19 PM 01/25/2020  ?  9:01 AM 05/26/2019  ? 10:19 AM 05/23/2018  ? 10:06 AM 04/16/2018  ?  9:25 PM 07/19/2015  ? 10:33 PM  ?Advanced Directives  ?Does Patient Have a Medical Advance Directive? No No No No No Yes No  ?Would patient like information on creating a medical advance directive? No - Patient declined   Yes (ED - Information included in AVS) No - Patient declined    ? ? ?Current Medications (verified) ?Outpatient Encounter Medications as of 05/05/2021  ?Medication Sig  ? ALPRAZolam (XANAX) 0.5 MG tablet TAKE 1 TABLET BY MOUTH EVERY DAY AS NEEDED FOR ANXIETY  ? atorvastatin (LIPITOR) 20 MG tablet TAKE 1 TABLET BY MOUTH EVERY DAY  ? buPROPion (WELLBUTRIN XL) 300 MG 24 hr tablet  Take 1 tablet (300 mg total) by mouth daily.  ? clonazePAM (KLONOPIN) 1 MG tablet TAKE 1 TABLET BY MOUTH ONCE DAILY AT BEDTIME FOR SLEEP AND  ANXIETY  ? escitalopram (LEXAPRO) 10 MG tablet Take 1 tablet (10 mg total) by mouth daily.  ? levothyroxine (SYNTHROID) 125 MCG tablet Take 1 tablet (125 mcg total) by mouth daily before breakfast.  ? metFORMIN (GLUCOPHAGE) 1000 MG tablet Take 1 tablet (1,000 mg total) by mouth 2 (two) times daily with a meal.  ? metoprolol succinate (TOPROL-XL) 25 MG 24 hr tablet Take 1 tablet (25 mg total) by mouth daily.  ? nicotine (NICODERM CQ - DOSED IN MG/24 HOURS) 21 mg/24hr patch Place 1 patch (21 mg total) onto the skin daily.  ? triamcinolone (NASACORT) 55 MCG/ACT AERO nasal inhaler Place 2 sprays into the nose daily. 2 sprays each nostril at night when you go to bed  ? valsartan-hydrochlorothiazide (DIOVAN-HCT) 160-12.5 MG tablet Take 1 tablet by mouth daily.  ? ?No facility-administered encounter medications on file as of 05/05/2021.  ? ? ?Allergies (verified) ?Patient has no known allergies.  ? ?History: ?Past Medical History:  ?Diagnosis Date  ? Allergy   ? Anxiety   ? Depression   ? Heart murmur   ? Hypertension   ? Thyroid disease   ? Ulcer   ? ?Past Surgical History:  ?Procedure Laterality Date  ? BREAST SURGERY    ? CESAREAN SECTION    ? CHOLECYSTECTOMY    ? NASAL  SEPTUM SURGERY    ? ?Family History  ?Problem Relation Age of Onset  ? Lung cancer Mother   ? Dementia Father   ? Atrial fibrillation Father   ? ?Social History  ? ?Socioeconomic History  ? Marital status: Divorced  ?  Spouse name: Not on file  ? Number of children: 2  ? Years of education: Not on file  ? Highest education level: Not on file  ?Occupational History  ? Occupation: Unemployed  ?Tobacco Use  ? Smoking status: Every Day  ?  Packs/day: 1.50  ?  Years: 40.00  ?  Pack years: 60.00  ?  Types: Cigarettes  ?  Last attempt to quit: 01/20/2012  ?  Years since quitting: 9.2  ? Smokeless tobacco: Never  ? Tobacco  comments:  ?  Pt doing E-cigarette as of 01/20/12  ?Vaping Use  ? Vaping Use: Former  ?Substance and Sexual Activity  ? Alcohol use: No  ? Drug use: No  ? Sexual activity: Never  ?Other Topics Concern  ? Not on file  ?Social History Narrative  ? Patient on worker's compensation for an injury sustained on the job 5 years ago.  ?   ? Right handed and Left Handed   ?   ? Lives in a two story home   ?   ? One son deceased.   ? ?Social Determinants of Health  ? ?Financial Resource Strain: Low Risk   ? Difficulty of Paying Living Expenses: Not hard at all  ?Food Insecurity: No Food Insecurity  ? Worried About Programme researcher, broadcasting/film/video in the Last Year: Never true  ? Ran Out of Food in the Last Year: Never true  ?Transportation Needs: No Transportation Needs  ? Lack of Transportation (Medical): No  ? Lack of Transportation (Non-Medical): No  ?Physical Activity: Inactive  ? Days of Exercise per Week: 0 days  ? Minutes of Exercise per Session: 0 min  ?Stress: No Stress Concern Present  ? Feeling of Stress : Not at all  ?Social Connections: Socially Isolated  ? Frequency of Communication with Friends and Family: Twice a week  ? Frequency of Social Gatherings with Friends and Family: Twice a week  ? Attends Religious Services: Never  ? Active Member of Clubs or Organizations: No  ? Attends Banker Meetings: Never  ? Marital Status: Divorced  ? ? ?Tobacco Counseling ?Ready to quit: Not Answered ?Counseling given: Not Answered ?Tobacco comments: Pt doing E-cigarette as of 01/20/12 ? ? ?Clinical Intake: ? ?Pre-visit preparation completed: Yes ? ?Pain : No/denies pain ? ?  ? ?Nutritional Risks: None ?Diabetes: No ? ?How often do you need to have someone help you when you read instructions, pamphlets, or other written materials from your doctor or pharmacy?: 1 - Never ?What is the last grade level you completed in school?: masters ? ?Diabetic?no  ? ?Interpreter Needed?: No ? ?Information entered by ::  L.Efrem Pitstick,LPN ? ? ?Activities of Daily Living ? ?  05/05/2021  ? 10:56 AM  ?In your present state of health, do you have any difficulty performing the following activities:  ?Hearing? 0  ?Vision? 0  ?Difficulty concentrating or making decisions? 0  ?Walking or climbing stairs? 0  ?Dressing or bathing? 0  ?Doing errands, shopping? 0  ?Preparing Food and eating ? N  ?Using the Toilet? N  ?In the past six months, have you accidently leaked urine? N  ?Do you have problems with loss of bowel control? N  ?Managing  your Medications? N  ?Managing your Finances? N  ?Housekeeping or managing your Housekeeping? N  ? ? ?Patient Care Team: ?Georgina QuintSagardia, Miguel Jose, MD as PCP - General (Internal Medicine) ?[place]endale ChardPatel, Donika K, DO as Consulting Physician (Neurology) ? ?Indicate any recent Medical Services you may have received from other than [facility] providers in the past year (date may be approximate). ? ?   ?Assessment:  ? This is a routine wellness examination for Jasmine Long. ? ?Hearing/Vision screen ?Vision Screening - Comments:: Declines eye exams  ? ?Dietary issues and exercise activities discussed: ?Current Exercise Habits: The patient does not participate in regular exercise at present, Exercise limited by: orthopedic condition(s) ? ? Goals Addressed   ?None ?  ? ?Depression Screen ? ?  05/05/2021  ? 10:52 AM 05/05/2021  ? 10:49 AM 03/19/2021  ? 10:23 AM 12/26/2020  ? 10:29 AM 05/03/2020  ?  2:31 PM 03/26/2020  ?  4:07 PM 01/18/2020  ? 10:17 AM  ?PHQ 2/9 Scores  ?PHQ - 2 Score 0 0 0 0 1 0 0  ?  ?Fall Risk ? ?  05/05/2021  ? 10:51 AM 03/19/2021  ? 10:23 AM 11/04/2020  ?  1:19 PM 03/26/2020  ?  4:07 PM 01/18/2020  ? 10:17 AM  ?Fall Risk   ?Falls in the past year? 0 0 1 0 0  ?Number falls in past yr: 0 0 0 0   ?Injury with Fall? 0 0 0 0   ?Follow up Falls evaluation completed   Falls evaluation completed Falls evaluation completed  ?Comment cane      ? ? ?FALL RISK PREVENTION PERTAINING TO THE HOME: ? ?Any stairs in or around the home? Yes  ?If so, are  there any without handrails? No  ?Home free of loose throw rugs in walkways, pet beds, electrical cords, etc? Yes  ?Adequate lighting in your home to reduce risk of falls? Yes  ? ?ASSISTIVE DEVICES UTILIZED TO PREVENT FALLS: ?

## 2021-05-05 NOTE — Patient Instructions (Signed)
Jasmine Long , ?Thank you for taking time to come for your Medicare Wellness Visit. I appreciate your ongoing commitment to your health goals. Please review the following plan we discussed and let me know if I can assist you in the future.  ? ?Screening recommendations/referrals: ?Colonoscopy: declined by patient  ?Mammogram: declined by patient  ?Bone Density: declined by patient  ?Recommended yearly ophthalmology/optometry visit for glaucoma screening and checkup ?Recommended yearly dental visit for hygiene and checkup ? ?Vaccinations: ?Influenza vaccine: declined  ?Pneumococcal vaccine: declined  ?Tdap vaccine: due  ?Shingles vaccine: declined    ? ?Advanced directives: none  ? ?Conditions/risks identified: none  ? ?Next appointment: none  ? ? ?Preventive Care 7 Years and Older, Female ?Preventive care refers to lifestyle choices and visits with your health care provider that can promote health and wellness. ?What does preventive care include? ?A yearly physical exam. This is also called an annual well check. ?Dental exams once or twice a year. ?Routine eye exams. Ask your health care provider how often you should have your eyes checked. ?Personal lifestyle choices, including: ?Daily care of your teeth and gums. ?Regular physical activity. ?Eating a healthy diet. ?Avoiding tobacco and drug use. ?Limiting alcohol use. ?Practicing safe sex. ?Taking low-dose aspirin every day. ?Taking vitamin and mineral supplements as recommended by your health care provider. ?What happens during an annual well check? ?The services and screenings done by your health care provider during your annual well check will depend on your age, overall health, lifestyle risk factors, and family history of disease. ?Counseling  ?Your health care provider may ask you questions about your: ?Alcohol use. ?Tobacco use. ?Drug use. ?Emotional well-being. ?Home and relationship well-being. ?Sexual activity. ?Eating habits. ?History of falls. ?Memory and  ability to understand (cognition). ?Work and work Astronomer. ?Reproductive health. ?Screening  ?You may have the following tests or measurements: ?Height, weight, and BMI. ?Blood pressure. ?Lipid and cholesterol levels. These may be checked every 5 years, or more frequently if you are over 7 years old. ?Skin check. ?Lung cancer screening. You may have this screening every year starting at age 59 if you have a 30-pack-year history of smoking and currently smoke or have quit within the past 15 years. ?Fecal occult blood test (FOBT) of the stool. You may have this test every year starting at age 62. ?Flexible sigmoidoscopy or colonoscopy. You may have a sigmoidoscopy every 5 years or a colonoscopy every 10 years starting at age 7. ?Hepatitis C blood test. ?Hepatitis B blood test. ?Sexually transmitted disease (STD) testing. ?Diabetes screening. This is done by checking your blood sugar (glucose) after you have not eaten for a while (fasting). You may have this done every 1-3 years. ?Bone density scan. This is done to screen for osteoporosis. You may have this done starting at age 6. ?Mammogram. This may be done every 1-2 years. Talk to your health care provider about how often you should have regular mammograms. ?Talk with your health care provider about your test results, treatment options, and if necessary, the need for more tests. ?Vaccines  ?Your health care provider may recommend certain vaccines, such as: ?Influenza vaccine. This is recommended every year. ?Tetanus, diphtheria, and acellular pertussis (Tdap, Td) vaccine. You may need a Td booster every 10 years. ?Zoster vaccine. You may need this after age 8. ?Pneumococcal 13-valent conjugate (PCV13) vaccine. One dose is recommended after age 3. ?Pneumococcal polysaccharide (PPSV23) vaccine. One dose is recommended after age 60. ?Talk to your health care provider about  which screenings and vaccines you need and how often you need them. ?This information is  not intended to replace advice given to you by your health care provider. Make sure you discuss any questions you have with your health care provider. ?Document Released: 02/08/2015 Document Revised: 10/02/2015 Document Reviewed: 11/13/2014 ?Elsevier Interactive Patient Education ? 2017 [place]. ? ?Fall Prevention in the Home ?Falls can cause injuries. They can happen to people of all ages. There are many things you can do to make your home safe and to help prevent falls. ?What can I do on the outside of my home? ?Regularly fix the edges of walkways and driveways and fix any cracks. ?Remove anything that might make you trip as you walk through a door, such as a raised step or threshold. ?Trim any bushes or trees on the path to your home. ?Use bright outdoor lighting. ?Clear any walking paths of anything that might make someone trip, such as rocks or tools. ?Regularly check to see if handrails are loose or broken. Make sure that both sides of any steps have handrails. ?Any raised decks and porches should have guardrails on the edges. ?Have any leaves, snow, or ice cleared regularly. ?Use sand or salt on walking paths during winter. ?Clean up any spills in your garage right away. This includes oil or grease spills. ?What can I do in the bathroom? ?Use night lights. ?Install grab bars by the toilet and in the tub and shower. Do not use towel bars as grab bars. ?Use non-skid mats or decals in the tub or shower. ?If you need to sit down in the shower, use a plastic, non-slip stool. ?Keep the floor dry. Clean up any water that spills on the floor as soon as it happens. ?Remove soap buildup in the tub or shower regularly. ?Attach bath mats securely with double-sided non-slip rug tape. ?Do not have throw rugs and other things on the floor that can make you trip. ?What can I do in the bedroom? ?Use night lights. ?Make sure that you have a light by your bed that is easy to reach. ?Do not use any sheets or blankets that  are too big for your bed. They should not hang down onto the floor. ?Have a firm chair that has side arms. You can use this for support while you get dressed. ?Do not have throw rugs and other things on the floor that can make you trip. ?What can I do in the kitchen? ?Clean up any spills right away. ?Avoid walking on wet floors. ?Keep items that you use a lot in easy-to-reach places. ?If you need to reach something above you, use a strong step stool that has a grab bar. ?Keep electrical cords out of the way. ?Do not use floor polish or wax that makes floors slippery. If you must use wax, use non-skid floor wax. ?Do not have throw rugs and other things on the floor that can make you trip. ?What can I do with my stairs? ?Do not leave any items on the stairs. ?Make sure that there are handrails on both sides of the stairs and use them. Fix handrails that are broken or loose. Make sure that handrails are as long as the stairways. ?Check any carpeting to make sure that it is firmly attached to the stairs. Fix any carpet that is loose or worn. ?Avoid having throw rugs at the top or bottom of the stairs. If you do have throw rugs, attach them to the floor with  carpet tape. ?Make sure that you have a light switch at the top of the stairs and the bottom of the stairs. If you do not have them, ask someone to add them for you. ?What else can I do to help prevent falls? ?Wear shoes that: ?Do not have high heels. ?Have rubber bottoms. ?Are comfortable and fit you well. ?Are closed at the toe. Do not wear sandals. ?If you use a stepladder: ?Make sure that it is fully opened. Do not climb a closed stepladder. ?Make sure that both sides of the stepladder are locked into place. ?Ask someone to hold it for you, if possible. ?Clearly mark and make sure that you can see: ?Any grab bars or handrails. ?First and last steps. ?Where the edge of each step is. ?Use tools that help you move around (mobility aids) if they are needed. These  include: ?Canes. ?Walkers. ?Scooters. ?Crutches. ?Turn on the lights when you go into a dark area. Replace any light bulbs as soon as they burn out. ?Set up your furniture so you have a clear path. Avoi

## 2021-05-20 ENCOUNTER — Ambulatory Visit: Payer: Medicare HMO | Admitting: Psychology

## 2021-05-20 ENCOUNTER — Telehealth: Payer: Self-pay | Admitting: Emergency Medicine

## 2021-05-20 ENCOUNTER — Other Ambulatory Visit: Payer: Self-pay | Admitting: Emergency Medicine

## 2021-05-20 MED ORDER — MECLIZINE HCL 25 MG PO TABS: 25.0000 mg | ORAL_TABLET | Freq: Three times a day (TID) | ORAL | 0 refills | Status: DC | PRN

## 2021-05-20 NOTE — Telephone Encounter (Signed)
Prescription for meclizine sent to pharmacy of record.  Thanks.

## 2021-05-20 NOTE — Telephone Encounter (Signed)
Called patient to inform her that her medication was sent to her pharmacy on file  ?

## 2021-05-20 NOTE — Telephone Encounter (Signed)
Pt states she is dizzy due to vertigo x1d ? ?Pt requesting a rx sent in ? ?Encouraged pt to schedule an appt, pt declined stating she can not drive ? ?Please advise ? ? ?

## 2021-05-20 NOTE — Telephone Encounter (Signed)
Thank you :)

## 2021-06-03 ENCOUNTER — Ambulatory Visit: Payer: Medicare HMO | Admitting: Psychology

## 2021-06-10 ENCOUNTER — Ambulatory Visit: Payer: Medicare HMO | Admitting: Psychology

## 2021-07-11 ENCOUNTER — Telehealth: Payer: Self-pay | Admitting: Neurology

## 2021-07-11 NOTE — Telephone Encounter (Signed)
Pt called an was advised we do have her paperwork but Dr Allena Katz is out of the office she asked if she can see another doctor while Dr Allena Katz is out, when I started to answer her saying that our doctors normally do not do that unless an emergency she cut me off stating that she stated she had an emergency call on the other line.

## 2021-07-11 NOTE — Telephone Encounter (Signed)
Patient called and left a voice mail requesting a call back about this. She also apologized for disconnecting the call abruptly.

## 2021-07-11 NOTE — Telephone Encounter (Signed)
Patient would like to know if patel received the papers she sent via mail.said she got confirmation it was delivered.

## 2021-07-11 NOTE — Telephone Encounter (Signed)
Pt called back and was informed of what Dr Karel Jarvis had stated that Notes reviewed. Last communication with patient in November 2022 indicated that nerve testing is normal and as Dr. Allena Katz had discussed in the office, her symptoms do not fit a nerve pattern, no neurological cause found. Her medial thigh pain may be more related to muscle/tendon pathology - she was offered referral to Sports Medicine for opinion and she said she will speak with her PCP. Recommend discussing paperwork with PCP. Pt stated that her PCP Dr Irving Shows told her to send over the paperwork to Dr Allena Katz to review and see if she needs to be seen , pt stated she would like to get scheduled for an appointment. Pt was transferred to the front to be added to a wait list / be scheduled for when Dr Allena Katz returns,

## 2021-07-11 NOTE — Telephone Encounter (Signed)
Notes reviewed. Last communication with patient in November 2022 indicated that nerve testing is normal and as Dr. Allena Katz had discussed in the office, her symptoms do not fit a nerve pattern, no neurological cause found. Her medial thigh pain may be more related to muscle/tendon pathology - she was offered referral to Sports Medicine for opinion and she said she will speak with her PCP. Recommend discussing paperwork with PCP.

## 2021-08-04 ENCOUNTER — Telehealth: Payer: Self-pay | Admitting: Emergency Medicine

## 2021-08-04 DIAGNOSIS — E785 Hyperlipidemia, unspecified: Secondary | ICD-10-CM

## 2021-08-04 MED ORDER — ATORVASTATIN CALCIUM 20 MG PO TABS: 20.0000 mg | ORAL_TABLET | Freq: Every day | ORAL | 3 refills | Status: DC

## 2021-08-04 NOTE — Telephone Encounter (Signed)
Caller & Relationship to patient: Jasmine Long  Call back number: [phone]  Date of last office visit: 03/19/21  Date of next office visit:   Medication(s) to be refilled:  atorvastatin (LIPITOR) 20 MG tablet      Preferred Pharmacy:  [facility] [address], [place] - [address] Phone:  [phone]  Fax:  [phone]

## 2021-08-04 NOTE — Telephone Encounter (Signed)
Prescription sent to pharmacy.

## 2021-08-20 ENCOUNTER — Telehealth: Payer: Self-pay | Admitting: Emergency Medicine

## 2021-08-20 MED ORDER — MECLIZINE HCL 25 MG PO TABS: 25.0000 mg | ORAL_TABLET | Freq: Three times a day (TID) | ORAL | 0 refills | Status: DC | PRN

## 2021-08-20 NOTE — Telephone Encounter (Signed)
New prescription sent to requested pharmacy  

## 2021-08-20 NOTE — Telephone Encounter (Signed)
Patient is having vertigo and would like to be prescribed the antivert again -   Patient states that she is dizzy and unable to drive.  Please [facility] [address].  Last Visit:  03/19/2021  Next visit:  Appointment not scheduled

## 2021-09-08 ENCOUNTER — Ambulatory Visit (INDEPENDENT_AMBULATORY_CARE_PROVIDER_SITE_OTHER): Payer: Medicare HMO | Admitting: Emergency Medicine

## 2021-09-08 ENCOUNTER — Encounter: Payer: Self-pay | Admitting: Emergency Medicine

## 2021-09-08 VITALS — BP 136/68 | HR 74 | Temp 98.4°F | Ht 69.0 in | Wt 248.0 lb

## 2021-09-08 DIAGNOSIS — E038 Other specified hypothyroidism: Secondary | ICD-10-CM

## 2021-09-08 DIAGNOSIS — R350 Frequency of micturition: Secondary | ICD-10-CM | POA: Insufficient documentation

## 2021-09-08 DIAGNOSIS — J410 Simple chronic bronchitis: Secondary | ICD-10-CM | POA: Diagnosis not present

## 2021-09-08 DIAGNOSIS — E785 Hyperlipidemia, unspecified: Secondary | ICD-10-CM

## 2021-09-08 DIAGNOSIS — E119 Type 2 diabetes mellitus without complications: Secondary | ICD-10-CM | POA: Diagnosis not present

## 2021-09-08 DIAGNOSIS — R42 Dizziness and giddiness: Secondary | ICD-10-CM | POA: Insufficient documentation

## 2021-09-08 DIAGNOSIS — E1169 Type 2 diabetes mellitus with other specified complication: Secondary | ICD-10-CM

## 2021-09-08 DIAGNOSIS — I10 Essential (primary) hypertension: Secondary | ICD-10-CM | POA: Diagnosis not present

## 2021-09-08 LAB — POCT URINALYSIS DIPSTICK
Bilirubin, UA: NEGATIVE
Blood, UA: NEGATIVE
Glucose, UA: NEGATIVE
Ketones, UA: NEGATIVE
Leukocytes, UA: NEGATIVE
Nitrite, UA: NEGATIVE
Protein, UA: NEGATIVE
Spec Grav, UA: 1.02 (ref 1.010–1.025)
Urobilinogen, UA: 0.2 E.U./dL
pH, UA: 6 (ref 5.0–8.0)

## 2021-09-08 LAB — POCT GLYCOSYLATED HEMOGLOBIN (HGB A1C): Hemoglobin A1C: 6.1 % — AB (ref 4.0–5.6)

## 2021-09-08 MED ORDER — MECLIZINE HCL 25 MG PO TABS: 25.0000 mg | ORAL_TABLET | Freq: Three times a day (TID) | ORAL | 1 refills | Status: DC | PRN

## 2021-09-08 NOTE — Assessment & Plan Note (Signed)
Still smoking.  Cardiovascular and cancer risks associated with smoking discussed.  Smoking cessation advice given.

## 2021-09-08 NOTE — Assessment & Plan Note (Signed)
Stable.  No red flag signs or symptoms.  Exacerbation of chronic problem. Differential diagnosis discussed. Advised to start meclizine 25 mg 3 times a day for several days and then as needed.

## 2021-09-08 NOTE — Progress Notes (Signed)
Jasmine Long 71 y.o.   Chief Complaint  Patient presents with   Follow-up    F/u vertigo   Urinary Frequency    HISTORY OF PRESENT ILLNESS: This is a 71 y.o. female complaining of vertigo for the last week.  Has history of recurrent vertigo episodes. Also occasional urinary frequency. No other complaints or medical concerns today.  Urinary Frequency  Associated symptoms include frequency. Pertinent negatives include no chills, nausea or vomiting.     Prior to Admission medications   Medication Sig Start Date End Date Taking? Authorizing Provider  ALPRAZolam Prudy Feeler) 0.5 MG tablet TAKE 1 TABLET BY MOUTH EVERY DAY AS NEEDED FOR ANXIETY 03/19/21  Yes Lillah Standre, Eilleen Kempf, MD  atorvastatin (LIPITOR) 20 MG tablet Take 1 tablet (20 mg total) by mouth daily. 08/04/21  Yes [name], MD  buPROPion (WELLBUTRIN XL) 300 MG 24 hr tablet Take 1 tablet (300 mg total) by mouth daily. 02/15/21  Yes Kendryck Lacroix, Eilleen Kempf, MD  clonazePAM (KLONOPIN) 1 MG tablet TAKE 1 TABLET BY MOUTH ONCE DAILY AT BEDTIME FOR SLEEP AND  ANXIETY 03/19/21  Yes [name], MD  levothyroxine (SYNTHROID) 125 MCG tablet Take 1 tablet (125 mcg total) by mouth daily before breakfast. 03/07/21  Yes Belmira Daley, Eilleen Kempf, MD  metFORMIN (GLUCOPHAGE) 1000 MG tablet Take 1 tablet (1,000 mg total) by mouth 2 (two) times daily with a meal. 02/15/21  Yes Edilia Ghuman, Eilleen Kempf, MD  metoprolol succinate (TOPROL-XL) 25 MG 24 hr tablet Take 1 tablet (25 mg total) by mouth daily. 02/15/21  Yes Solan Vosler, Eilleen Kempf, MD  nicotine (NICODERM CQ - DOSED IN MG/24 HOURS) 21 mg/24hr patch Place 1 patch (21 mg total) onto the skin daily. 04/20/18  Yes Linville Decarolis, Eilleen Kempf, MD  triamcinolone (NASACORT) 55 MCG/ACT AERO nasal inhaler Place 2 sprays into the nose daily. 2 sprays each nostril at night when you go to bed 10/08/20  Yes Drema Halon, MD  valsartan-hydrochlorothiazide (DIOVAN-HCT) 160-12.5 MG tablet Take 1 tablet by  mouth daily. 03/19/21  Yes Davionne Dowty, Eilleen Kempf, MD  escitalopram (LEXAPRO) 10 MG tablet Take 1 tablet (10 mg total) by mouth daily. 02/17/21 05/18/21  [name], MD  meclizine (ANTIVERT) 25 MG tablet Take 1 tablet (25 mg total) by mouth 3 (three) times daily as needed for dizziness. 09/08/21   [name], MD    No Known Allergies  Patient Active Problem List   Diagnosis Date Noted   Smokers' cough (HCC) 12/26/2020   Left leg pain 12/26/2020   Chronic bilateral low back pain with left-sided sciatica 12/26/2020   Impaired ambulation 12/26/2020   Chronic sinus complaints 12/26/2020   Chronic anxiety 06/28/2019   History of peptic ulcer 04/30/2017   Hyperlipidemia 12/11/2015   Insomnia 12/11/2015   Panic attack as reaction to stress 12/11/2015   Current smoker 12/11/2015   Chronic sinusitis 12/11/2015   Dyslipidemia associated with type 2 diabetes mellitus (HCC) 05/15/2015   Hypothyroid 04/29/2011   BMI 40.0-44.9, adult (HCC) 04/29/2011   Situational mixed anxiety and depressive disorder 04/29/2011   GERD (gastroesophageal reflux disease) 04/29/2011   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   Nicotine addiction 04/29/2011   Essential hypertension 04/29/2011   Murmur, cardiac 04/29/2011    Past Medical History:  Diagnosis Date   Allergy    Anxiety    Depression    Heart murmur    Hypertension    Thyroid disease    Ulcer     Past Surgical History:  Procedure Laterality Date   BREAST SURGERY     CESAREAN SECTION     CHOLECYSTECTOMY     NASAL SEPTUM SURGERY      Social History   Socioeconomic History   Marital status: Divorced    Spouse name: Not on file   Number of children: 2   Years of education: Not on file   Highest education level: Not on file  Occupational History   Occupation: Unemployed  Tobacco Use   Smoking status: Every Day    Packs/day: 1.50    Years: 40.00    Total pack years: 60.00    Types: Cigarettes    Last attempt to quit:  01/20/2012    Years since quitting: 9.6   Smokeless tobacco: Never   Tobacco comments:    Pt doing E-cigarette as of 01/20/12  Vaping Use   Vaping Use: Former  Substance and Sexual Activity   Alcohol use: No   Drug use: No   Sexual activity: Never  Other Topics Concern   Not on file  Social History Narrative   Patient on worker's compensation for an injury sustained on the job 5 years ago.      Right handed and Left Handed       Lives in a two story home       One son deceased.    Social Determinants of Health   Financial Resource Strain: Low Risk  (05/05/2021)   Overall Financial Resource Strain (CARDIA)    Difficulty of Paying Living Expenses: Not hard at all  Food Insecurity: No Food Insecurity (05/05/2021)   Hunger Vital Sign    Worried About Running Out of Food in the Last Year: Never true    Ran Out of Food in the Last Year: Never true  Transportation Needs: No Transportation Needs (05/05/2021)   PRAPARE - Administrator, Civil ServiceTransportation    Lack of Transportation (Medical): No    Lack of Transportation (Non-Medical): No  Physical Activity: Inactive (05/05/2021)   Exercise Vital Sign    Days of Exercise per Week: 0 days    Minutes of Exercise per Session: 0 min  Stress: No Stress Concern Present (05/05/2021)   [employer] - Occupational Stress Questionnaire    Feeling of Stress : Not at all  Social Connections: Socially Isolated (05/05/2021)   Social Connection and Isolation Panel [NHANES]    Frequency of Communication with Friends and Family: Twice a week    Frequency of Social Gatherings with Friends and Family: Twice a week    Attends Religious Services: Never    Database administratorActive Member of Clubs or Organizations: No    Attends BankerClub or Organization Meetings: Never    Marital Status: Divorced  Catering managerntimate Partner Violence: Not At Risk (05/05/2021)   Humiliation, Afraid, Rape, and Kick questionnaire    Fear of Current or Ex-Partner: No    Emotionally Abused: No     Physically Abused: No    Sexually Abused: No    Family History  Problem Relation Age of Onset   Lung cancer Mother    Dementia Father    Atrial fibrillation Father      Review of Systems  Constitutional: Negative.  Negative for chills and fever.  HENT: Negative.  Negative for congestion and sore throat.   Respiratory: Negative.  Negative for cough and shortness of breath.   Cardiovascular:  Negative for chest pain and palpitations.  Gastrointestinal:  Negative for abdominal pain, diarrhea, nausea and vomiting.  Genitourinary:  Positive  for frequency.  Musculoskeletal: Negative.   Skin: Negative.  Negative for rash.  Neurological:  Positive for dizziness.  All other systems reviewed and are negative.  Today's Vitals   09/08/21 1558  BP: 136/68  Pulse: 74  Temp: 98.4 F (36.9 C)  TempSrc: Oral  SpO2: 96%  Weight: 248 lb (112.5 kg)  Height: 5\' 9"  (1.753 m)   Body mass index is 36.62 kg/m. Wt Readings from Last 3 Encounters:  09/08/21 248 lb (112.5 kg)  03/19/21 237 lb (107.5 kg)  02/17/21 237 lb (107.5 kg)     Physical Exam Vitals reviewed.  Constitutional:      Appearance: Normal appearance.  HENT:     Head: Normocephalic.     Right Ear: Tympanic membrane, ear canal and external ear normal.     Left Ear: Tympanic membrane, ear canal and external ear normal.     Nose: Nose normal.     Mouth/Throat:     Mouth: Mucous membranes are moist.     Pharynx: Oropharynx is clear.  Eyes:     Extraocular Movements: Extraocular movements intact.     Conjunctiva/sclera: Conjunctivae normal.     Pupils: Pupils are equal, round, and reactive to light.  Cardiovascular:     Rate and Rhythm: Normal rate and regular rhythm.     Pulses: Normal pulses.     Heart sounds: Normal heart sounds.  Pulmonary:     Effort: Pulmonary effort is normal.     Breath sounds: Normal breath sounds.  Musculoskeletal:     Cervical back: No tenderness.  Lymphadenopathy:     Cervical: No  cervical adenopathy.  Neurological:     General: No focal deficit present.     Mental Status: She is alert and oriented to person, place, and time.     Sensory: No sensory deficit.     Motor: No weakness.     Coordination: Coordination normal.     Gait: Gait normal.  Psychiatric:        Mood and Affect: Mood normal.        Behavior: Behavior normal.    Results for orders placed or performed in visit on 09/08/21 (from the past 24 hour(s))  POCT HgB A1C     Status: Abnormal   Collection Time: 09/08/21  4:37 PM  Result Value Ref Range   Hemoglobin A1C 6.1 (A) 4.0 - 5.6 %   HbA1c POC (<> result, manual entry)     HbA1c, POC (prediabetic range)     HbA1c, POC (controlled diabetic range)    POCT Urinalysis Dipstick     Status: Normal   Collection Time: 09/08/21  4:37 PM  Result Value Ref Range   Color, UA yellow    Clarity, UA clear    Glucose, UA Negative Negative   Bilirubin, UA negative    Ketones, UA negative    Spec Grav, UA 1.020 1.010 - 1.025   Blood, UA negative    pH, UA 6.0 5.0 - 8.0   Protein, UA Negative Negative   Urobilinogen, UA 0.2 0.2 or 1.0 E.U./dL   Nitrite, UA negative    Leukocytes, UA Negative Negative   Appearance     Odor       ASSESSMENT & PLAN: A total of 45 minutes was spent with the patient and counseling/coordination of care regarding preparing for this visit, review of most recent office visit notes, review of multiple chronic medical problems and their management, review of all medications, review of  most recent blood work results including today's interpretation of hemoglobin A1c and urinalysis, education on nutrition, smoking cessation advice, prognosis, documentation, and need for follow-up.  Problem List Items Addressed This Visit       Cardiovascular and Mediastinum   Essential hypertension    Well-controlled hypertension. Continue valsartan-HCTZ 160-12.5 mg daily. BP Readings from Last 3 Encounters:  09/08/21 136/68  03/19/21 (!)  164/80  02/17/21 130/70           Respiratory   Smokers' cough (HCC)    Still smoking.  Cardiovascular and cancer risks associated with smoking discussed.  Smoking cessation advice given.        Endocrine   Hypothyroid    Clinically euthyroid  Continue Synthroid 125 mcg daily.       Dyslipidemia associated with type 2 diabetes mellitus (HCC)    Stable.  Diet and nutrition discussed. Well-controlled diabetes with hemoglobin A1c of 6.1.  Continue metformin 1000 mg twice a day and atorvastatin 20 mg daily. Lab Results  Component Value Date   HGBA1C 6.1 (A) 09/08/2021   The 10-year ASCVD risk score (Arnett DK, et al., 2019) is: 35.9%   Values used to calculate the score:     Age: 40 years     Sex: Female     Is Non-Hispanic African American: No     Diabetic: Yes     Tobacco smoker: Yes     Systolic Blood Pressure: 136 mmHg     Is BP treated: Yes     HDL Cholesterol: 51 mg/dL     Total Cholesterol: 189 mg/dL          Other   Vertigo - Primary    Stable.  No red flag signs or symptoms.  Exacerbation of chronic problem. Differential diagnosis discussed. Advised to start meclizine 25 mg 3 times a day for several days and then as needed.      Relevant Medications   meclizine (ANTIVERT) 25 MG tablet   Other Relevant Orders   Ambulatory referral to ENT   Urinary frequency    History of frequent UTIs.  Normal urinalysis.  Doubt infection.      Relevant Orders   POCT Urinalysis Dipstick (Completed)   Other Visit Diagnoses     Type 2 diabetes mellitus without complication, without long-term current use of insulin (HCC)       Relevant Orders   POCT HgB A1C (Completed)      Patient Instructions  Vertigo Vertigo is the feeling that you or the things around you are moving when they are not. This feeling can come and go at any time. Vertigo often goes away on its own. This condition can be dangerous if it happens when you are doing activities like driving or  working with machines. Your doctor will do tests to find the cause of your vertigo. These tests will also help your doctor decide on the best treatment for you. Follow these instructions at home: Eating and drinking     Drink enough fluid to keep your pee (urine) pale yellow. Do not drink alcohol. Activity Return to your normal activities when your doctor says that it is safe. In the morning, first sit up on the side of the bed. When you feel okay, stand slowly while you hold onto something until you know that your balance is fine. Move slowly. Avoid sudden body or head movements or certain positions, as told by your doctor. Use a cane if you have trouble standing or  walking. Sit down right away if you feel dizzy. Avoid doing any tasks or activities that can cause danger to you or others if you get dizzy. Avoid bending down if you feel dizzy. Place items in your home so that they are easy for you to reach without bending or leaning over. Do not drive or use machinery if you feel dizzy. General instructions Take over-the-counter and prescription medicines only as told by your doctor. Keep all follow-up visits. Contact a doctor if: Your medicine does not help your vertigo. Your problems get worse or you have new symptoms. You have a fever. You feel like you may vomit (nauseous), or this feeling gets worse. You start to vomit. Your family or friends see changes in how you act. You lose feeling (have numbness) in part of your body. You feel prickling and tingling in a part of your body. Get help right away if: You are always dizzy. You faint. You get very bad headaches. You get a stiff neck. Bright light starts to bother you. You have trouble moving or talking. You feel weak in your hands, arms, or legs. You have changes in your hearing or in how you see (vision). These symptoms may be an emergency. Get help right away. Call your local emergency services (911 in the [place] Do not  wait to see if the symptoms will go away. Do not drive yourself to the hospital. Summary Vertigo is the feeling that you or the things around you are moving when they are not. Your doctor will do tests to find the cause of your vertigo. You may be told to avoid some tasks, positions, or movements. Contact a doctor if your medicine is not helping, or if you have a fever, new symptoms, or a change in how you act. Get help right away if you get very bad headaches, or if you have changes in how you speak, hear, or see. This information is not intended to replace advice given to you by your health care provider. Make sure you discuss any questions you have with your health care provider. Document Revised: 12/13/2019 Document Reviewed: 12/13/2019 Elsevier Patient Education  2023 Elsevier Inc.    Edwina Barth, MD [facility]

## 2021-09-08 NOTE — Assessment & Plan Note (Signed)
Clinically euthyroid. Continue Synthroid 125 mcg daily. 

## 2021-09-08 NOTE — Assessment & Plan Note (Signed)
History of frequent UTIs.  Normal urinalysis.  Doubt infection.

## 2021-09-08 NOTE — Assessment & Plan Note (Signed)
Stable.  Diet and nutrition discussed. Well-controlled diabetes with hemoglobin A1c of 6.1.  Continue metformin 1000 mg twice a day and atorvastatin 20 mg daily. Lab Results  Component Value Date   HGBA1C 6.1 (A) 09/08/2021   The 10-year ASCVD risk score (Arnett DK, et al., 2019) is: 35.9%   Values used to calculate the score:     Age: 71 years     Sex: Female     Is Non-Hispanic African American: No     Diabetic: Yes     Tobacco smoker: Yes     Systolic Blood Pressure: 136 mmHg     Is BP treated: Yes     HDL Cholesterol: 51 mg/dL     Total Cholesterol: 189 mg/dL

## 2021-09-08 NOTE — Assessment & Plan Note (Signed)
Well-controlled hypertension. Continue valsartan-HCTZ 160-12.5 mg daily. BP Readings from Last 3 Encounters:  09/08/21 136/68  03/19/21 (!) 164/80  02/17/21 130/70

## 2021-09-08 NOTE — Patient Instructions (Signed)
Vertigo Vertigo is the feeling that you or the things around you are moving when they are not. This feeling can come and go at any time. Vertigo often goes away on its own. This condition can be dangerous if it happens when you are doing activities like driving or working with machines. Your doctor will do tests to find the cause of your vertigo. These tests will also help your doctor decide on the best treatment for you. Follow these instructions at home: Eating and drinking     Drink enough fluid to keep your pee (urine) pale yellow. Do not drink alcohol. Activity Return to your normal activities when your doctor says that it is safe. In the morning, first sit up on the side of the bed. When you feel okay, stand slowly while you hold onto something until you know that your balance is fine. Move slowly. Avoid sudden body or head movements or certain positions, as told by your doctor. Use a cane if you have trouble standing or walking. Sit down right away if you feel dizzy. Avoid doing any tasks or activities that can cause danger to you or others if you get dizzy. Avoid bending down if you feel dizzy. Place items in your home so that they are easy for you to reach without bending or leaning over. Do not drive or use machinery if you feel dizzy. General instructions Take over-the-counter and prescription medicines only as told by your doctor. Keep all follow-up visits. Contact a doctor if: Your medicine does not help your vertigo. Your problems get worse or you have new symptoms. You have a fever. You feel like you may vomit (nauseous), or this feeling gets worse. You start to vomit. Your family or friends see changes in how you act. You lose feeling (have numbness) in part of your body. You feel prickling and tingling in a part of your body. Get help right away if: You are always dizzy. You faint. You get very bad headaches. You get a stiff neck. Bright light starts to bother  you. You have trouble moving or talking. You feel weak in your hands, arms, or legs. You have changes in your hearing or in how you see (vision). These symptoms may be an emergency. Get help right away. Call your local emergency services (911 in the [place] Do not wait to see if the symptoms will go away. Do not drive yourself to the hospital. Summary Vertigo is the feeling that you or the things around you are moving when they are not. Your doctor will do tests to find the cause of your vertigo. You may be told to avoid some tasks, positions, or movements. Contact a doctor if your medicine is not helping, or if you have a fever, new symptoms, or a change in how you act. Get help right away if you get very bad headaches, or if you have changes in how you speak, hear, or see. This information is not intended to replace advice given to you by your health care provider. Make sure you discuss any questions you have with your health care provider. Document Revised: 12/13/2019 Document Reviewed: 12/13/2019 Elsevier Patient Education  2023 Elsevier Inc.  

## 2021-09-18 ENCOUNTER — Other Ambulatory Visit: Payer: Self-pay | Admitting: Emergency Medicine

## 2021-09-18 ENCOUNTER — Telehealth: Payer: Self-pay

## 2021-09-18 DIAGNOSIS — R42 Dizziness and giddiness: Secondary | ICD-10-CM

## 2021-09-18 MED ORDER — MECLIZINE HCL 25 MG PO TABS: 25.0000 mg | ORAL_TABLET | Freq: Three times a day (TID) | ORAL | 1 refills | Status: DC | PRN

## 2021-09-18 NOTE — Telephone Encounter (Signed)
Called patient to inform her that her meclizine rx has been sent to her pharmacy

## 2021-09-18 NOTE — Telephone Encounter (Signed)
Refill meclizine.  Thanks.

## 2021-09-18 NOTE — Telephone Encounter (Signed)
ENT apptmnt was schd for 09/26. Pt states she is in need of medication for her vertigo. Pt needs to know if she needs to refill her Meclizine or can be given another medication for the dizziness.  Please call the pt at [phone] with an update on if she needs to refill or if med has been changed.

## 2021-10-01 ENCOUNTER — Telehealth: Payer: Self-pay | Admitting: Emergency Medicine

## 2021-10-01 NOTE — Telephone Encounter (Signed)
Called patient to inform her that a rx for the meclizine was sent to her pharmacy on 09/18/2021 with 1 refill

## 2021-10-01 NOTE — Telephone Encounter (Signed)
Patient wants to know if you will call in enough antivert till her appointment with the ENT - or something stronger if it will help.

## 2021-10-02 NOTE — Progress Notes (Signed)
Follow-up Visit   Date: 10/03/2021    Jasmine Long MRN: [id number] DOB: February 03, 1950    Jasmine Long is a 71 y.o. abidextrous-handed Caucasian female with hypertension, hypothyroidism, anxiety/depression, GERD, and former smoker returning to the clinic for follow-up of left leg pain.  The patient was accompanied to the clinic by self.   IMPRESSION/PLAN: Left leg pain involving the medial thigh, starting in the groin and radiating toward the knee.  I cannot isolate her symptoms to a peripheral neve. Symptoms are not consistent with L2-3 radiculopathy, but given her ongoing symptoms, I will order MRI lumbar spine to evaluate this further.  MRI lumbar spine from showed degenerative changes involving facet disease, but no nerve impingement.  If imaging his negative, I recommend evaluation by sports medicine to assess if pain could be radiating from the hip.  Left lateral thigh and lower leg numbness, initially suggestive of meralgia paresthetica, but numbness extend beyond the lateral femoral cutaneous nerve, and down into the lower leg.  NCS/EMG of the left leg in November 2022 was normal.  I have reviewed notes from Dr. Vela Prose, her prior neurologist, who also mentioned etiology of left leg symptoms was unclear given that symptoms did not follow a nerve pattern.   --------------------------------------------- History of present illness: Starting around 2016, she began having sharp pain from her medial groin into the medial knee.  Pain occurs daily without any specific trigger.  It can is present at rest and with activity.  Sometimes, it causes her leg to buckle.  She prefers to use a shopping cart when grocery shopping because it helps with support. She also complains of numbness over the left lateral thigh and lower leg.  She had prior NCS/EMG by Dr. Vela Prose when symptoms first started and was told she has nerve injury. MRI lumbar spine from 2017 shows degenerative changes without nerve  impingement.  No benefit with PT. She is requesting repeat NCS/EMG. She has fallen about 2-3 times per year.    UPDATE 10/03/2021:  She continues to have pain involving the left medial thigh.  She has spells where her leg will buckle and she falls.  She has spells where her leg freezes and she is unable to lift the leg up.  Repeat NCS/EMG of the left leg in November 2022 was normal.     Medications:  Current Outpatient Medications on File Prior to Visit  Medication Sig Dispense Refill   ALPRAZolam (XANAX) 0.5 MG tablet TAKE 1 TABLET BY MOUTH EVERY DAY AS NEEDED FOR ANXIETY 30 tablet 1   atorvastatin (LIPITOR) 20 MG tablet Take 1 tablet (20 mg total) by mouth daily. 90 tablet 3   buPROPion (WELLBUTRIN XL) 300 MG 24 hr tablet Take 1 tablet (300 mg total) by mouth daily. 90 tablet 1   clonazePAM (KLONOPIN) 1 MG tablet TAKE 1 TABLET BY MOUTH ONCE DAILY AT BEDTIME FOR SLEEP AND  ANXIETY 30 tablet 1   levothyroxine (SYNTHROID) 125 MCG tablet Take 1 tablet (125 mcg total) by mouth daily before breakfast. 90 tablet 2   meclizine (ANTIVERT) 25 MG tablet Take 1 tablet (25 mg total) by mouth 3 (three) times daily as needed for dizziness. 30 tablet 1   metFORMIN (GLUCOPHAGE) 1000 MG tablet Take 1 tablet (1,000 mg total) by mouth 2 (two) times daily with a meal. 180 tablet 1   metoprolol succinate (TOPROL-XL) 25 MG 24 hr tablet Take 1 tablet (25 mg total) by mouth daily. 90 tablet 1  valsartan-hydrochlorothiazide (DIOVAN-HCT) 160-12.5 MG tablet Take 1 tablet by mouth daily. 90 tablet 3   escitalopram (LEXAPRO) 10 MG tablet Take 1 tablet (10 mg total) by mouth daily. (Patient not taking: Reported on 10/03/2021) 90 tablet 1   nicotine (NICODERM CQ - DOSED IN MG/24 HOURS) 21 mg/24hr patch Place 1 patch (21 mg total) onto the skin daily. (Patient not taking: Reported on 10/03/2021) 28 patch 3   triamcinolone (NASACORT) 55 MCG/ACT AERO nasal inhaler Place 2 sprays into the nose daily. 2 sprays each nostril at night when  you go to bed (Patient not taking: Reported on 10/03/2021) 1 each 12   No current facility-administered medications on file prior to visit.    Allergies: No Known Allergies  Vital Signs:  BP (!) 146/77   Pulse 99   Ht 5\' 9"  (1.753 m)   Wt 246 lb (111.6 kg)   SpO2 95%   BMI 36.33 kg/m     Neurological Exam: MENTAL STATUS including orientation to time, place, person, recent and remote memory, attention span and concentration, language, and fund of knowledge is normal.  Speech is not dysarthric.  CRANIAL NERVES:  No visual field defects.  Pupils equal round and reactive to light.  Normal conjugate, extra-ocular eye movements in all directions of gaze.  No ptosis.  Face is symmetric.   MOTOR:  Motor strength is 5/5 in all extremities, except 5-/5 with right hip flexion and extension.  No atrophy, fasciculations or abnormal movements.  No pronator drift.  Tone is normal.    MSRs:  Reflexes are 2+/4 throughout.  SENSORY:  Reduced temperature and pin prick over the left lateral thigh and lower leg. Vibration intact throughout.  COORDINATION/GAIT:  Normal finger-to- nose-finger.  Intact rapid alternating movements bilaterally.  Gait narrow based and stable.   Data: NCS/EMG of the legs 12/05/2020: This is a normal study of the left lower extremity.  In particular, there is no evidence of a lumbosacral radiculopathy or sensorimotor polyneuropathy.    Thank you for allowing me to participate in patient's care.  If I can answer any additional questions, I would be pleased to do so.    Sincerely,    Dolorez Jeffrey K. Allena Katz, DO

## 2021-10-03 ENCOUNTER — Encounter: Payer: Self-pay | Admitting: Neurology

## 2021-10-03 ENCOUNTER — Ambulatory Visit: Payer: Medicare HMO | Admitting: Neurology

## 2021-10-03 VITALS — BP 146/77 | HR 99 | Ht 69.0 in | Wt 246.0 lb

## 2021-10-03 DIAGNOSIS — M79605 Pain in left leg: Secondary | ICD-10-CM

## 2021-10-03 DIAGNOSIS — R200 Anesthesia of skin: Secondary | ICD-10-CM

## 2021-10-03 NOTE — Patient Instructions (Signed)
We will order MRI lumbar spine

## 2021-10-21 DIAGNOSIS — R42 Dizziness and giddiness: Secondary | ICD-10-CM | POA: Diagnosis not present

## 2021-10-23 ENCOUNTER — Other Ambulatory Visit: Payer: Self-pay | Admitting: Otolaryngology

## 2021-10-23 DIAGNOSIS — R42 Dizziness and giddiness: Secondary | ICD-10-CM

## 2021-10-27 ENCOUNTER — Telehealth: Payer: Self-pay | Admitting: Neurology

## 2021-10-27 MED ORDER — DIAZEPAM 2 MG PO TABS: ORAL_TABLET | ORAL | 0 refills | Status: DC

## 2021-10-27 NOTE — Telephone Encounter (Signed)
Called patient and let her know all recommendations

## 2021-10-27 NOTE — Telephone Encounter (Signed)
Pt said she was ordered an MRI. She is claustrophobic and needs to know what she can do

## 2021-10-27 NOTE — Telephone Encounter (Signed)
Rx has been sent for valium 2mg  - take 1 tablet 30 min prior to MRI.  She will need to take someone to drive her.  Please tell her not to take the medication with xanax or Klonopin, as all of them can make her sleepy.  Thanks.

## 2021-10-28 ENCOUNTER — Emergency Department (HOSPITAL_COMMUNITY)
Admission: EM | Admit: 2021-10-28 | Discharge: 2021-10-28 | Disposition: A | Discharge status: Home / Self Care | Payer: Medicare HMO | Attending: Emergency Medicine | Admitting: Emergency Medicine

## 2021-10-28 ENCOUNTER — Ambulatory Visit: Payer: Medicare HMO | Admitting: Emergency Medicine

## 2021-10-28 ENCOUNTER — Encounter (HOSPITAL_COMMUNITY): Payer: Self-pay

## 2021-10-28 ENCOUNTER — Other Ambulatory Visit: Payer: Self-pay

## 2021-10-28 DIAGNOSIS — X58XXXA Exposure to other specified factors, initial encounter: Secondary | ICD-10-CM | POA: Diagnosis not present

## 2021-10-28 DIAGNOSIS — S161XXA Strain of muscle, fascia and tendon at neck level, initial encounter: Secondary | ICD-10-CM | POA: Insufficient documentation

## 2021-10-28 DIAGNOSIS — Z79899 Other long term (current) drug therapy: Secondary | ICD-10-CM | POA: Insufficient documentation

## 2021-10-28 DIAGNOSIS — M542 Cervicalgia: Secondary | ICD-10-CM | POA: Diagnosis not present

## 2021-10-28 DIAGNOSIS — I10 Essential (primary) hypertension: Secondary | ICD-10-CM | POA: Diagnosis not present

## 2021-10-28 DIAGNOSIS — S199XXA Unspecified injury of neck, initial encounter: Secondary | ICD-10-CM | POA: Diagnosis present

## 2021-10-28 LAB — CBC WITH DIFFERENTIAL/PLATELET
Abs Immature Granulocytes: 0.03 10*3/uL (ref 0.00–0.07)
Basophils Absolute: 0 10*3/uL (ref 0.0–0.1)
Basophils Relative: 0 %
Eosinophils Absolute: 0 10*3/uL (ref 0.0–0.5)
Eosinophils Relative: 0 %
HCT: 40.5 % (ref 36.0–46.0)
Hemoglobin: 13.1 g/dL (ref 12.0–15.0)
Immature Granulocytes: 0 %
Lymphocytes Relative: 25 %
Lymphs Abs: 2.6 10*3/uL (ref 0.7–4.0)
MCH: 29.8 pg (ref 26.0–34.0)
MCHC: 32.3 g/dL (ref 30.0–36.0)
MCV: 92.3 fL (ref 80.0–100.0)
Monocytes Absolute: 0.9 10*3/uL (ref 0.1–1.0)
Monocytes Relative: 9 %
Neutro Abs: 7 10*3/uL (ref 1.7–7.7)
Neutrophils Relative %: 66 %
Platelets: 313 10*3/uL (ref 150–400)
RBC: 4.39 MIL/uL (ref 3.87–5.11)
RDW: 14 % (ref 11.5–15.5)
WBC: 10.6 10*3/uL — ABNORMAL HIGH (ref 4.0–10.5)
nRBC: 0 % (ref 0.0–0.2)

## 2021-10-28 MED ORDER — CYCLOBENZAPRINE HCL 5 MG PO TABS: 5.0000 mg | ORAL_TABLET | Freq: Two times a day (BID) | ORAL | 0 refills | Status: DC | PRN

## 2021-10-28 MED ORDER — CYCLOBENZAPRINE HCL 10 MG PO TABS: 5.0000 mg | ORAL_TABLET | Freq: Once | ORAL | Status: DC

## 2021-10-28 MED ORDER — LIDOCAINE 5 % EX PTCH
1.0000 | MEDICATED_PATCH | CUTANEOUS | Status: DC
  Administered 2021-10-28: 1 via TRANSDERMAL
  Filled 2021-10-28: qty 1

## 2021-10-28 MED ORDER — ACETAMINOPHEN 500 MG PO TABS
1000.0000 mg | ORAL_TABLET | ORAL | Status: AC
  Administered 2021-10-28: 1000 mg via ORAL
  Filled 2021-10-28: qty 2

## 2021-10-28 NOTE — ED Notes (Signed)
Dr Philip Aspen no labs are needed at this time

## 2021-10-28 NOTE — ED Triage Notes (Signed)
Patient arrives with stiffness in her neck. Patient states she has a headache aswell.

## 2021-10-28 NOTE — ED Notes (Signed)
Pt has elected not to have the Covid Test. She has been asked by writer ad NT both times stating she did not want the test.

## 2021-10-28 NOTE — ED Notes (Signed)
Pt alert and oriented, at present time blood is being obtained for test. Pt main concern is wait time. She continues to be observed.

## 2021-10-28 NOTE — ED Provider Notes (Signed)
[facility]-EMERGENCY DEPT Provider Note   CSN: [id number] Arrival date & time: 10/28/21  0454     History  Chief Complaint  Patient presents with   Torticollis    Jasmine Long is a 71 y.o. female.  71 year old female with a history of chronic vertigo, migraines, and anxiety who presents emergency department with neck stiffness.  Patient reports that on Sunday she feels that she slept wrong on her neck and awoke with mild neck pain.  Says that the stiffness has been getting worse.  Says that it is on the left and right side of her neck.  Has tried Voltaren gel without improvement.  Also started to feel mild headache in her occiput which is typical of her occipital migraines.  Says that it was slow in onset and has not had any fevers, weakness or numbness of her arms or legs, vision changes, slurred speech.  Says that she is being followed by ENT and her primary doctor for her vertigo.  On chart review has been chronic for several months.  They are going to do an MRI for the patient.  Says that her vertigo is the same as typical and denies any speech or swallowing changes.  No history of cancer, IVDU, trauma, or immunosuppressant drugs.    Past Medical History:  Diagnosis Date   Allergy    Anxiety    Depression    Heart murmur    Hypertension    Thyroid disease    Ulcer       Home Medications Prior to Admission medications   Medication Sig Start Date End Date Taking? Authorizing Provider  cyclobenzaprine (FLEXERIL) 5 MG tablet Take 1 tablet (5 mg total) by mouth 2 (two) times daily as needed for up to 10 doses for muscle spasms. 10/28/21  Yes Rondel Baton, MD  ALPRAZolam Prudy Feeler) 0.5 MG tablet TAKE 1 TABLET BY MOUTH EVERY DAY AS NEEDED FOR ANXIETY 03/19/21   [name], MD  atorvastatin (LIPITOR) 20 MG tablet Take 1 tablet (20 mg total) by mouth daily. 08/04/21   [name], MD  buPROPion (WELLBUTRIN XL) 300 MG 24 hr tablet Take 1  tablet (300 mg total) by mouth daily. 02/15/21   [name], MD  clonazePAM (KLONOPIN) 1 MG tablet TAKE 1 TABLET BY MOUTH ONCE DAILY AT BEDTIME FOR SLEEP AND  ANXIETY 03/19/21   [name], MD  diazepam (VALIUM) 2 MG tablet Take 1 tablet 30-min prior to MRI. 10/27/21   Patel, Donika K, DO  escitalopram (LEXAPRO) 10 MG tablet Take 1 tablet (10 mg total) by mouth daily. Patient not taking: Reported on 10/03/2021 02/17/21 10/03/21  [name], MD  levothyroxine (SYNTHROID) 125 MCG tablet Take 1 tablet (125 mcg total) by mouth daily before breakfast. 03/07/21   [name], MD  meclizine (ANTIVERT) 25 MG tablet Take 1 tablet (25 mg total) by mouth 3 (three) times daily as needed for dizziness. 09/18/21   [name], MD  metFORMIN (GLUCOPHAGE) 1000 MG tablet Take 1 tablet (1,000 mg total) by mouth 2 (two) times daily with a meal. 02/15/21   Sagardia, Eilleen Kempf, MD  metoprolol succinate (TOPROL-XL) 25 MG 24 hr tablet Take 1 tablet (25 mg total) by mouth daily. 02/15/21   [name], MD  nicotine (NICODERM CQ - DOSED IN MG/24 HOURS) 21 mg/24hr patch Place 1 patch (21 mg total) onto the skin daily. Patient not taking: Reported on 10/03/2021 04/20/18  Horald Pollen, MD  triamcinolone (NASACORT) 55 MCG/ACT AERO nasal inhaler Place 2 sprays into the nose daily. 2 sprays each nostril at night when you go to bed Patient not taking: Reported on 10/03/2021 10/08/20   Rozetta Nunnery, MD  valsartan-hydrochlorothiazide (DIOVAN-HCT) 160-12.5 MG tablet Take 1 tablet by mouth daily. 03/19/21   Horald Pollen, MD      Allergies    Patient has no known allergies.    Review of Systems   Review of Systems  Physical Exam Updated Vital Signs BP (!) 160/133 (BP Location: Left Arm)   Pulse 90   Temp 98.9 F (37.2 C) (Oral)   Resp 18   SpO2 99%  Physical Exam Vitals and nursing note reviewed.  Constitutional:      General: She is not in acute  distress.    Appearance: She is well-developed.  HENT:     Head: Normocephalic and atraumatic.     Right Ear: External ear normal.     Left Ear: External ear normal.     Nose: Nose normal.  Eyes:     Extraocular Movements: Extraocular movements intact.     Conjunctiva/sclera: Conjunctivae normal.     Pupils: Pupils are equal, round, and reactive to light.  Neck:     Comments: Bilateral paraspinal tenderness to palpation. Pulmonary:     Effort: Pulmonary effort is normal. No respiratory distress.  Abdominal:     General: Abdomen is flat. There is no distension.     Palpations: Abdomen is soft.  Musculoskeletal:        General: No swelling.  Skin:    General: Skin is warm and dry.     Capillary Refill: Capillary refill takes less than 2 seconds.  Neurological:     Mental Status: She is alert. Mental status is at baseline.     Comments: MENTAL STATUS: AAOx3 CRANIAL NERVES: II: Pupils equal and reactive 3 mm BL, no RAPD, no VF deficits III, IV, VI: EOM intact, no gaze preference or deviation, no nystagmus. V: normal sensation to light touch in V1, V2, and V3 segments bilaterally VII: no facial weakness or asymmetry, no nasolabial fold flattening VIII: normal hearing to speech and finger friction IX, X: normal palatal elevation, no uvular deviation XI: 5/5 head turn and 5/5 shoulder shrug bilaterally XII: midline tongue protrusion MOTOR: 5/5 strength in R shoulder flexion, elbow flexion and extension, and grip strength. 5/5 strength in L shoulder flexion, elbow flexion and extension, and grip strength.  5/5 strength in R hip and knee flexion, knee extension, ankle plantar and dorsiflexion. 5/5 strength in L hip and knee flexion, knee extension, ankle plantar and dorsiflexion. SENSORY: Normal sensation to light touch in all extremities COORD: Normal finger to nose and heel to shin, no tremor, no dysmetria STATION: normal stance, no truncal ataxia GAIT: Narrow-based.  At baseline  per last neuro note on 9/8.   Psychiatric:        Mood and Affect: Mood normal.     ED Results / Procedures / Treatments   Labs (all labs ordered are listed, but only abnormal results are displayed) Labs Reviewed  CBC WITH DIFFERENTIAL/PLATELET - Abnormal; Notable for the following components:      Result Value   WBC 10.6 (*)    All other components within normal limits  BASIC METABOLIC PANEL    EKG None  Radiology No results found.  Procedures Procedures   Medications Ordered in ED Medications  lidocaine (LIDODERM) 5 %  1 patch (1 patch Transdermal Patch Applied 10/28/21 0927)  cyclobenzaprine (FLEXERIL) tablet 5 mg (5 mg Oral Not Given 10/28/21 0928)  acetaminophen (TYLENOL) tablet 1,000 mg (1,000 mg Oral Given 10/28/21 7672)    ED Course/ Medical Decision Making/ A&P Clinical Course as of 10/28/21 1014  Tue Oct 28, 2021  0923 Patient declined Flexeril [RP]  [phone] Patient left prior to receiving paperwork. [RP]    Clinical Course User Index [RP] Rondel Baton, MD                           Medical Decision Making Risk OTC drugs. Prescription drug management.   MARDIE KELLEN is a 71 y.o. female with comorbidities that complicate the patient evaluation including chronic vertigo, migraines, and anxiety who presents with chief complaint of neck pain and headache.  This patient presents to the ED for concern of complaints listed in HPI, this involves an extensive number of treatment options, and is a complaint that carries with it a high risk of complications and morbidity.   Initial Ddx:  Muscle strain, meningitis, vertebrobasilar insufficiency/dissection, fracture/injury, ICH, stroke  MDM:  Feel the patient likely has a muscular strain given the fact that it started and is gradually worsened after she feels that she slept on it wrong.  Does have paraspinal tenderness bilaterally.  No infectious symptoms that would suggest meningitis.  Headache appears to be  typical of her chronic migraines.  No new deficits that would suggest vertebrobasilar dissection.  No trauma to suggest fracture or injury.  Migraine pattern again is typical of her usual so feel that ICH is highly unlikely and her neuro exam is unchanged from prior making posterior circulation stroke highly unlikely as well.  Plan:  Tylenol Lidocaine patch Flexeril low-dose given the patient's age  ED Summary:  Patient was feeling much better after the Tylenol and lidocaine patch and was requesting to go.  We will have her follow-up with neurology and her primary doctor regarding her migraines and vertigo.  Was prescribed low-dose Flexeril for 10 doses at home and was instructed not to drive or operate heavy machinery or take opiates with this medication.  Patient left prior to receiving discharge paperwork.  Dispo: DC Home. Return precautions discussed with patient prior to discharge. Allowed pt time to ask questions which were answered fully prior to dc.  Records reviewed Outpatient Clinic Notes I personally reviewed and interpreted the pt's EKG: see above for interpretation  I have reviewed the patients home medications and made adjustments as needed  Final Clinical Impression(s) / ED Diagnoses Final diagnoses:  Neck pain  Acute strain of neck muscle, initial encounter    Rx / DC Orders ED Discharge Orders          Ordered    cyclobenzaprine (FLEXERIL) 5 MG tablet  2 times daily PRN        10/28/21 1000              Rondel Baton, MD 10/28/21 1014

## 2021-10-28 NOTE — ED Notes (Signed)
Pt states she has to leave due to someone at her house to complete a job. Please send her prescriptions to [facility] as she is walking out the department. Unable to update VS at the time she left.

## 2021-10-31 ENCOUNTER — Ambulatory Visit (INDEPENDENT_AMBULATORY_CARE_PROVIDER_SITE_OTHER): Payer: Medicare HMO | Admitting: Nurse Practitioner

## 2021-10-31 VITALS — HR 100 | Temp 97.4°F | Ht 69.0 in | Wt 241.0 lb

## 2021-10-31 DIAGNOSIS — M542 Cervicalgia: Secondary | ICD-10-CM | POA: Diagnosis not present

## 2021-10-31 MED ORDER — LIDOCAINE 4 % EX PTCH: 1.0000 | MEDICATED_PATCH | CUTANEOUS | 1 refills | Status: DC

## 2021-10-31 NOTE — Progress Notes (Signed)
Established Patient Office Visit  Subjective   Patient ID: Jasmine Long, female    DOB: 12-17-1950  Age: 71 y.o. MRN: [id number]  Chief Complaint  Patient presents with   Follow-up    Pt stated she could not move her neck, which made her go to the ED, still not able to move and experience some pain which sometimes radiate to her neck and shoulders      Patient arrives for ER follow-up.  Reports neck pain that started September 30, she ended up in the ER on October 3 and was diagnosed with muscular strain.  Prescribed Flexeril but reports never having taken it.  Today she continues to complain of a stiff neck, pain is 0 out of 10 when she is at rest but moving her neck too far causes 10 out of 10 pain.  She denies headache, blurry vision, fever, numbness, weakness, visual changes.    Review of Systems  HENT:  Negative for ear pain.   Eyes:  Negative for blurred vision and double vision.  Musculoskeletal:  Positive for neck pain.      Objective:     Pulse 100   Temp (!) 97.4 F (36.3 C) (Temporal)   Ht 5\' 9"  (1.753 m)   Wt 241 lb (109.3 kg)   SpO2 98%   BMI 35.59 kg/m    Physical Exam Vitals reviewed.  Constitutional:      General: She is not in acute distress.    Appearance: Normal appearance.  HENT:     Head: Normocephalic and atraumatic.  Neck:     Vascular: No carotid bruit.  Cardiovascular:     Rate and Rhythm: Normal rate and regular rhythm.     Pulses: Normal pulses.     Heart sounds: Normal heart sounds.  Pulmonary:     Effort: Pulmonary effort is normal.     Breath sounds: Normal breath sounds.  Musculoskeletal:     Cervical back: Tenderness present. No swelling, deformity or rigidity. Pain with movement present. Decreased range of motion (due to pain).  Skin:    General: Skin is warm and dry.  Neurological:     General: No focal deficit present.     Mental Status: She is alert and oriented to person, place, and time.     Cranial Nerves: Cranial nerves  2-12 are intact.     Sensory: Sensation is intact.     Motor: Motor function is intact.     Coordination: Coordination is intact.     Gait: Gait is intact.  Psychiatric:        Mood and Affect: Mood normal.        Behavior: Behavior normal.        Judgment: Judgment normal.      No results found for any visits on 10/31/21.    The 10-year ASCVD risk score (Arnett DK, et al., 2019) is: 46.1%    Assessment & Plan:   Problem List Items Addressed This Visit       Other   Neck pain - Primary    Acute, agree with ER provider evaluation appears to be consistent with muscle strain.  Recommend conservative treatment.  Patient unwilling to undergo blood work today to check kidney function.  Per shared decision making patient will trial lidocaine patch and use of heat and/or ice.  Patient to follow-up if symptoms persist or worsen.  Patient reports understanding.      Relevant Medications   lidocaine 4 %  Other Relevant Orders   Basic metabolic panel    Return if symptoms worsen or fail to improve.    Elenore Paddy, NP

## 2021-10-31 NOTE — Assessment & Plan Note (Signed)
Acute, agree with ER provider evaluation appears to be consistent with muscle strain.  Recommend conservative treatment.  Patient unwilling to undergo blood work today to check kidney function.  Per shared decision making patient will trial lidocaine patch and use of heat and/or ice.  Patient to follow-up if symptoms persist or worsen.  Patient reports understanding.

## 2021-11-14 ENCOUNTER — Other Ambulatory Visit: Payer: Self-pay | Admitting: Emergency Medicine

## 2021-11-14 DIAGNOSIS — F4323 Adjustment disorder with mixed anxiety and depressed mood: Secondary | ICD-10-CM

## 2021-11-14 DIAGNOSIS — F418 Other specified anxiety disorders: Secondary | ICD-10-CM

## 2021-12-23 ENCOUNTER — Ambulatory Visit: Payer: Medicare HMO | Admitting: Emergency Medicine

## 2021-12-23 ENCOUNTER — Other Ambulatory Visit: Payer: Medicare HMO

## 2021-12-25 ENCOUNTER — Encounter: Payer: Self-pay | Admitting: Emergency Medicine

## 2021-12-25 ENCOUNTER — Ambulatory Visit (INDEPENDENT_AMBULATORY_CARE_PROVIDER_SITE_OTHER): Payer: Medicare HMO | Admitting: Emergency Medicine

## 2021-12-25 VITALS — BP 146/72 | HR 91 | Temp 98.6°F | Ht 69.0 in | Wt 240.0 lb

## 2021-12-25 DIAGNOSIS — I10 Essential (primary) hypertension: Secondary | ICD-10-CM

## 2021-12-25 DIAGNOSIS — F418 Other specified anxiety disorders: Secondary | ICD-10-CM

## 2021-12-25 DIAGNOSIS — S0003XA Contusion of scalp, initial encounter: Secondary | ICD-10-CM | POA: Insufficient documentation

## 2021-12-25 DIAGNOSIS — F17200 Nicotine dependence, unspecified, uncomplicated: Secondary | ICD-10-CM

## 2021-12-25 DIAGNOSIS — J329 Chronic sinusitis, unspecified: Secondary | ICD-10-CM | POA: Diagnosis not present

## 2021-12-25 DIAGNOSIS — E1169 Type 2 diabetes mellitus with other specified complication: Secondary | ICD-10-CM | POA: Diagnosis not present

## 2021-12-25 DIAGNOSIS — F419 Anxiety disorder, unspecified: Secondary | ICD-10-CM

## 2021-12-25 DIAGNOSIS — E785 Hyperlipidemia, unspecified: Secondary | ICD-10-CM

## 2021-12-25 MED ORDER — ESCITALOPRAM OXALATE 10 MG PO TABS: 10.0000 mg | ORAL_TABLET | Freq: Every day | ORAL | 3 refills | Status: DC

## 2021-12-25 MED ORDER — DOXYCYCLINE HYCLATE 100 MG PO TABS: 100.0000 mg | ORAL_TABLET | Freq: Two times a day (BID) | ORAL | 1 refills | Status: AC

## 2021-12-25 NOTE — Assessment & Plan Note (Signed)
With acute exacerbation today. May benefit from doxycycline 100 mg twice a day for 7 days which usually helps for her. ENT referral placed today.

## 2021-12-25 NOTE — Assessment & Plan Note (Signed)
Stable.  Diet and nutrition discussed. Continue atorvastatin 20 mg daily and metformin 1000 mg twice a day

## 2021-12-25 NOTE — Assessment & Plan Note (Signed)
Stable.  Continue Lexapro 10 mg daily and alprazolam as needed.

## 2021-12-25 NOTE — Assessment & Plan Note (Signed)
Well-controlled hypertension with normal blood pressure readings at home Continue valsartan hydrochlorothiazide 160-12.5 mg daily along with metoprolol succinate 25 mg BP Readings from Last 3 Encounters:  12/25/21 (!) 146/72  10/28/21 (!) 160/133  10/03/21 (!) 146/77

## 2021-12-25 NOTE — Assessment & Plan Note (Addendum)
Secondary to recent trauma to scalp.   Benign finding.  Neurologically intact. No concerns.

## 2021-12-25 NOTE — Assessment & Plan Note (Signed)
Cancer/cardiovascular risks associated with smoking discussed. Smoking cessation advice given Not motivated enough to quit.

## 2021-12-25 NOTE — Progress Notes (Signed)
Jasmine Long 71 y.o.   Chief Complaint  Patient presents with   Follow-up    Knot on her head, suitcase fell on patient head, 2 weeks ago , sinus issues     HISTORY OF PRESENT ILLNESS: This is a 71 y.o. female complaining of single hard lump on her scalp after suitcase fell on her head 2 weeks ago.  No loss of consciousness or associated symptoms. Also has history of chronic sinus problems with possible acute exacerbation.  Requesting ENT referral. No other complaints or medical concerns today.  HPI   Prior to Admission medications   Medication Sig Start Date End Date Taking? Authorizing Provider  ALPRAZolam Prudy Feeler) 0.5 MG tablet TAKE 1 TABLET BY MOUTH ONCE DAILY AS NEEDED FOR ANXIETY 11/15/21  Yes Neely Kammerer, Eilleen Kempf, MD  atorvastatin (LIPITOR) 20 MG tablet Take 1 tablet (20 mg total) by mouth daily. 08/04/21  Yes [name], MD  buPROPion (WELLBUTRIN XL) 300 MG 24 hr tablet Take 1 tablet (300 mg total) by mouth daily. 02/15/21  Yes Romelia Bromell, Eilleen Kempf, MD  clonazePAM (KLONOPIN) 0.5 MG tablet Take 0.5 mg by mouth 2 (two) times daily as needed for anxiety.   Yes [provider]  levothyroxine (SYNTHROID) 125 MCG tablet Take 1 tablet (125 mcg total) by mouth daily before breakfast. 03/07/21  Yes Om Lizotte, Eilleen Kempf, MD  metFORMIN (GLUCOPHAGE) 1000 MG tablet Take 1 tablet (1,000 mg total) by mouth 2 (two) times daily with a meal. 02/15/21  Yes Chee Dimon, Eilleen Kempf, MD  metoprolol succinate (TOPROL-XL) 25 MG 24 hr tablet Take 1 tablet (25 mg total) by mouth daily. 02/15/21  Yes Numan Zylstra, Eilleen Kempf, MD  nicotine (NICODERM CQ - DOSED IN MG/24 HOURS) 21 mg/24hr patch Place 1 patch (21 mg total) onto the skin daily. 04/20/18  Yes Markea Ruzich, Eilleen Kempf, MD  valsartan-hydrochlorothiazide (DIOVAN-HCT) 160-12.5 MG tablet Take 1 tablet by mouth daily. 03/19/21  Yes Odesser Tourangeau, Eilleen Kempf, MD  escitalopram (LEXAPRO) 10 MG tablet Take 1 tablet (10 mg total) by mouth daily. Patient  not taking: Reported on 10/03/2021 02/17/21 10/03/21  [name], MD    No Known Allergies  Patient Active Problem List   Diagnosis Date Noted   Neck pain 10/31/2021   Vertigo 09/08/2021   Urinary frequency 09/08/2021   Smokers' cough (HCC) 12/26/2020   Chronic bilateral low back pain with left-sided sciatica 12/26/2020   Impaired ambulation 12/26/2020   Chronic sinus complaints 12/26/2020   Chronic anxiety 06/28/2019   History of peptic ulcer 04/30/2017   Hyperlipidemia 12/11/2015   Insomnia 12/11/2015   Panic attack as reaction to stress 12/11/2015   Current smoker 12/11/2015   Chronic sinusitis 12/11/2015   Dyslipidemia associated with type 2 diabetes mellitus (HCC) 05/15/2015   Hypothyroid 04/29/2011   BMI 40.0-44.9, adult (HCC) 04/29/2011   Situational mixed anxiety and depressive disorder 04/29/2011   GERD (gastroesophageal reflux disease) 04/29/2011   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   Nicotine addiction 04/29/2011   Essential hypertension 04/29/2011   Murmur, cardiac 04/29/2011    Past Medical History:  Diagnosis Date   Allergy    Anxiety    Depression    Heart murmur    Hypertension    Thyroid disease    Ulcer     Past Surgical History:  Procedure Laterality Date   BREAST SURGERY     CESAREAN SECTION     CHOLECYSTECTOMY     NASAL SEPTUM SURGERY      Social History  Socioeconomic History   Marital status: Divorced    Spouse name: Not on file   Number of children: 2   Years of education: Not on file   Highest education level: Not on file  Occupational History   Occupation: Unemployed  Tobacco Use   Smoking status: Every Day    Packs/day: 1.50    Years: 40.00    Total pack years: 60.00    Types: Cigarettes    Last attempt to quit: 01/20/2012    Years since quitting: 9.9   Smokeless tobacco: Never   Tobacco comments:    Pt doing E-cigarette as of 01/20/12  Vaping Use   Vaping Use: Former  Substance and Sexual Activity   Alcohol  use: No   Drug use: No   Sexual activity: Never  Other Topics Concern   Not on file  Social History Narrative   Patient on worker's compensation for an injury sustained on the job 5 years ago.      Right handed and Left Handed       Lives in a two story home       One son deceased.    Social Determinants of Health   Financial Resource Strain: Low Risk  (05/05/2021)   Overall Financial Resource Strain (CARDIA)    Difficulty of Paying Living Expenses: Not hard at all  Food Insecurity: No Food Insecurity (05/05/2021)   Hunger Vital Sign    Worried About Running Out of Food in the Last Year: Never true    Ran Out of Food in the Last Year: Never true  Transportation Needs: No Transportation Needs (05/05/2021)   PRAPARE - Administrator, Civil Service (Medical): No    Lack of Transportation (Non-Medical): No  Physical Activity: Inactive (05/05/2021)   Exercise Vital Sign    Days of Exercise per Week: 0 days    Minutes of Exercise per Session: 0 min  Stress: No Stress Concern Present (05/05/2021)   [employer] of Occupational Health - Occupational Stress Questionnaire    Feeling of Stress : Not at all  Social Connections: Socially Isolated (05/05/2021)   Social Connection and Isolation Panel [NHANES]    Frequency of Communication with Friends and Family: Twice a week    Frequency of Social Gatherings with Friends and Family: Twice a week    Attends Religious Services: Never    Database administrator or Organizations: No    Attends Banker Meetings: Never    Marital Status: Divorced  Catering manager Violence: Not At Risk (05/05/2021)   Humiliation, Afraid, Rape, and Kick questionnaire    Fear of Current or Ex-Partner: No    Emotionally Abused: No    Physically Abused: No    Sexually Abused: No    Family History  Problem Relation Age of Onset   Lung cancer Mother    Dementia Father    Atrial fibrillation Father      Review of Systems   Constitutional: Negative.  Negative for chills and fever.  HENT:  Positive for congestion. Negative for sore throat.   Respiratory:  Positive for cough (Smoker's cough).   Cardiovascular: Negative.  Negative for chest pain and palpitations.  Gastrointestinal:  Negative for abdominal pain, diarrhea, nausea and vomiting.  Genitourinary: Negative.   Skin: Negative.  Negative for rash.  Neurological: Negative.  Negative for dizziness and headaches.  All other systems reviewed and are negative.  Today's Vitals   12/25/21 1045 12/25/21 1125  BP: Marland Kitchen)  148/86 (!) 146/72  Pulse: 91   Temp: 98.6 F (37 C)   TempSrc: Oral   SpO2: 94%   Weight: 240 lb (108.9 kg)   Height: 5\' 9"  (1.753 m)    Body mass index is 35.44 kg/m.   Physical Exam Vitals reviewed.  Constitutional:      Appearance: Normal appearance.  HENT:     Head: Normocephalic.  Eyes:     Extraocular Movements: Extraocular movements intact.     Pupils: Pupils are equal, round, and reactive to light.  Cardiovascular:     Rate and Rhythm: Normal rate.  Pulmonary:     Effort: Pulmonary effort is normal.  Musculoskeletal:        General: Normal range of motion.  Skin:    General: Skin is warm and dry.     Comments: Small hard hematoma left frontotemporal area  Neurological:     General: No focal deficit present.     Mental Status: She is alert and oriented to person, place, and time.  Psychiatric:        Mood and Affect: Mood normal.        Behavior: Behavior normal.      ASSESSMENT & PLAN: A total of 42 minutes was spent with the patient and counseling/coordination of care regarding preparing for this visit, review of most recent office visit notes, review of multiple chronic medical conditions and their management, review of all medications, review of most recent blood work results, prognosis, documentation, and need for follow-up  Problem List Items Addressed This Visit       Cardiovascular and Mediastinum    Essential hypertension    Well-controlled hypertension with normal blood pressure readings at home Continue valsartan hydrochlorothiazide 160-12.5 mg daily along with metoprolol succinate 25 mg BP Readings from Last 3 Encounters:  12/25/21 (!) 146/72  10/28/21 (!) 160/133  10/03/21 (!) 146/77          Respiratory   Chronic sinusitis - Primary    With acute exacerbation today. May benefit from doxycycline 100 mg twice a day for 7 days which usually helps for her. ENT referral placed today.      Relevant Medications   doxycycline (VIBRA-TABS) 100 MG tablet   Other Relevant Orders   Ambulatory referral to ENT     Endocrine   Dyslipidemia associated with type 2 diabetes mellitus (HCC)    Stable.  Diet and nutrition discussed. Continue atorvastatin 20 mg daily and metformin 1000 mg twice a day        Other   Depression with anxiety   Relevant Medications   escitalopram (LEXAPRO) 10 MG tablet   Current smoker    Cancer/cardiovascular risks associated with smoking discussed. Smoking cessation advice given Not motivated enough to quit.      Chronic anxiety    Stable.  Continue Lexapro 10 mg daily and alprazolam as needed.      Relevant Medications   escitalopram (LEXAPRO) 10 MG tablet   Hematoma of scalp    Secondary to recent trauma to scalp.   Benign finding.  Neurologically intact. No concerns.      Patient Instructions  Health Maintenance After Age 110 After age 89, you are at a higher risk for certain long-term diseases and infections as well as injuries from falls. Falls are a major cause of broken bones and head injuries in people who are older than age 59. Getting regular preventive care can help to keep you healthy and well. Preventive care  includes getting regular testing and making lifestyle changes as recommended by your health care provider. Talk with your health care provider about: Which screenings and tests you should have. A screening is a test that  checks for a disease when you have no symptoms. A diet and exercise plan that is right for you. What should I know about screenings and tests to prevent falls? Screening and testing are the best ways to find a health problem early. Early diagnosis and treatment give you the best chance of managing medical conditions that are common after age 69. Certain conditions and lifestyle choices may make you more likely to have a fall. Your health care provider may recommend: Regular vision checks. Poor vision and conditions such as cataracts can make you more likely to have a fall. If you wear glasses, make sure to get your prescription updated if your vision changes. Medicine review. Work with your health care provider to regularly review all of the medicines you are taking, including over-the-counter medicines. Ask your health care provider about any side effects that may make you more likely to have a fall. Tell your health care provider if any medicines that you take make you feel dizzy or sleepy. Strength and balance checks. Your health care provider may recommend certain tests to check your strength and balance while standing, walking, or changing positions. Foot health exam. Foot pain and numbness, as well as not wearing proper footwear, can make you more likely to have a fall. Screenings, including: Osteoporosis screening. Osteoporosis is a condition that causes the bones to get weaker and break more easily. Blood pressure screening. Blood pressure changes and medicines to control blood pressure can make you feel dizzy. Depression screening. You may be more likely to have a fall if you have a fear of falling, feel depressed, or feel unable to do activities that you used to do. Alcohol use screening. Using too much alcohol can affect your balance and may make you more likely to have a fall. Follow these instructions at home: Lifestyle Do not drink alcohol if: Your health care provider tells you not to  drink. If you drink alcohol: Limit how much you have to: 0-1 drink a day for women. 0-2 drinks a day for men. Know how much alcohol is in your drink. In the [place] one drink equals one 12 oz bottle of beer (355 mL), one 5 oz glass of wine (148 mL), or one 1 oz glass of hard liquor (44 mL). Do not use any products that contain nicotine or tobacco. These products include cigarettes, chewing tobacco, and vaping devices, such as e-cigarettes. If you need help quitting, ask your health care provider. Activity  Follow a regular exercise program to stay fit. This will help you maintain your balance. Ask your health care provider what types of exercise are appropriate for you. If you need a cane or walker, use it as recommended by your health care provider. Wear supportive shoes that have nonskid soles. Safety  Remove any tripping hazards, such as rugs, cords, and clutter. Install safety equipment such as grab bars in bathrooms and safety rails on stairs. Keep rooms and walkways well-lit. General instructions Talk with your health care provider about your risks for falling. Tell your health care provider if: You fall. Be sure to tell your health care provider about all falls, even ones that seem minor. You feel dizzy, tiredness (fatigue), or off-balance. Take over-the-counter and prescription medicines only as told by your health care provider.  These include supplements. Eat a healthy diet and maintain a healthy weight. A healthy diet includes low-fat dairy products, low-fat (lean) meats, and fiber from whole grains, beans, and lots of fruits and vegetables. Stay current with your vaccines. Schedule regular health, dental, and eye exams. Summary Having a healthy lifestyle and getting preventive care can help to protect your health and wellness after age 57. Screening and testing are the best way to find a health problem early and help you avoid having a fall. Early diagnosis and treatment give you  the best chance for managing medical conditions that are more common for people who are older than age 51. Falls are a major cause of broken bones and head injuries in people who are older than age 65. Take precautions to prevent a fall at home. Work with your health care provider to learn what changes you can make to improve your health and wellness and to prevent falls. This information is not intended to replace advice given to you by your health care provider. Make sure you discuss any questions you have with your health care provider. Document Revised: 06/03/2020 Document Reviewed: 06/03/2020 Elsevier Patient Education  2023 Elsevier Inc.    Edwina Barth, MD [facility]

## 2021-12-25 NOTE — Patient Instructions (Signed)
Health Maintenance After Age 69 After age 69, you are at a higher risk for certain long-term diseases and infections as well as injuries from falls. Falls are a major cause of broken bones and head injuries in people who are older than age 69. Getting regular preventive care can help to keep you healthy and well. Preventive care includes getting regular testing and making lifestyle changes as recommended by your health care provider. Talk with your health care provider about: Which screenings and tests you should have. A screening is a test that checks for a disease when you have no symptoms. A diet and exercise plan that is right for you. What should I know about screenings and tests to prevent falls? Screening and testing are the best ways to find a health problem early. Early diagnosis and treatment give you the best chance of managing medical conditions that are common after age 69. Certain conditions and lifestyle choices may make you more likely to have a fall. Your health care provider may recommend: Regular vision checks. Poor vision and conditions such as cataracts can make you more likely to have a fall. If you wear glasses, make sure to get your prescription updated if your vision changes. Medicine review. Work with your health care provider to regularly review all of the medicines you are taking, including over-the-counter medicines. Ask your health care provider about any side effects that may make you more likely to have a fall. Tell your health care provider if any medicines that you take make you feel dizzy or sleepy. Strength and balance checks. Your health care provider may recommend certain tests to check your strength and balance while standing, walking, or changing positions. Foot health exam. Foot pain and numbness, as well as not wearing proper footwear, can make you more likely to have a fall. Screenings, including: Osteoporosis screening. Osteoporosis is a condition that causes  the bones to get weaker and break more easily. Blood pressure screening. Blood pressure changes and medicines to control blood pressure can make you feel dizzy. Depression screening. You may be more likely to have a fall if you have a fear of falling, feel depressed, or feel unable to do activities that you used to do. Alcohol use screening. Using too much alcohol can affect your balance and may make you more likely to have a fall. Follow these instructions at home: Lifestyle Do not drink alcohol if: Your health care provider tells you not to drink. If you drink alcohol: Limit how much you have to: 0-1 drink a day for women. 0-2 drinks a day for men. Know how much alcohol is in your drink. In the [place] one drink equals one 12 oz bottle of beer (355 mL), one 5 oz glass of wine (148 mL), or one 1 oz glass of hard liquor (44 mL). Do not use any products that contain nicotine or tobacco. These products include cigarettes, chewing tobacco, and vaping devices, such as e-cigarettes. If you need help quitting, ask your health care provider. Activity  Follow a regular exercise program to stay fit. This will help you maintain your balance. Ask your health care provider what types of exercise are appropriate for you. If you need a cane or walker, use it as recommended by your health care provider. Wear supportive shoes that have nonskid soles. Safety  Remove any tripping hazards, such as rugs, cords, and clutter. Install safety equipment such as grab bars in bathrooms and safety rails on stairs. Keep rooms and walkways   well-lit. General instructions Talk with your health care provider about your risks for falling. Tell your health care provider if: You fall. Be sure to tell your health care provider about all falls, even ones that seem minor. You feel dizzy, tiredness (fatigue), or off-balance. Take over-the-counter and prescription medicines only as told by your health care provider. These include  supplements. Eat a healthy diet and maintain a healthy weight. A healthy diet includes low-fat dairy products, low-fat (lean) meats, and fiber from whole grains, beans, and lots of fruits and vegetables. Stay current with your vaccines. Schedule regular health, dental, and eye exams. Summary Having a healthy lifestyle and getting preventive care can help to protect your health and wellness after age 69. Screening and testing are the best way to find a health problem early and help you avoid having a fall. Early diagnosis and treatment give you the best chance for managing medical conditions that are more common for people who are older than age 69. Falls are a major cause of broken bones and head injuries in people who are older than age 69. Take precautions to prevent a fall at home. Work with your health care provider to learn what changes you can make to improve your health and wellness and to prevent falls. This information is not intended to replace advice given to you by your health care provider. Make sure you discuss any questions you have with your health care provider. Document Revised: 06/03/2020 Document Reviewed: 06/03/2020 Elsevier Patient Education  2023 Elsevier Inc.  

## 2022-01-09 ENCOUNTER — Ambulatory Visit: Payer: Medicare HMO | Admitting: Neurology

## 2022-03-19 ENCOUNTER — Other Ambulatory Visit: Payer: Self-pay | Admitting: Emergency Medicine

## 2022-03-19 DIAGNOSIS — F4323 Adjustment disorder with mixed anxiety and depressed mood: Secondary | ICD-10-CM

## 2022-03-19 DIAGNOSIS — F418 Other specified anxiety disorders: Secondary | ICD-10-CM

## 2022-04-06 DIAGNOSIS — H4311 Vitreous hemorrhage, right eye: Secondary | ICD-10-CM | POA: Diagnosis not present

## 2022-04-06 DIAGNOSIS — H43811 Vitreous degeneration, right eye: Secondary | ICD-10-CM | POA: Diagnosis not present

## 2022-04-07 ENCOUNTER — Encounter: Payer: Self-pay | Admitting: Emergency Medicine

## 2022-04-07 ENCOUNTER — Ambulatory Visit (INDEPENDENT_AMBULATORY_CARE_PROVIDER_SITE_OTHER): Payer: Medicare HMO | Admitting: Emergency Medicine

## 2022-04-07 VITALS — BP 136/76 | HR 95 | Temp 98.7°F | Ht 69.0 in | Wt 224.1 lb

## 2022-04-07 DIAGNOSIS — H43391 Other vitreous opacities, right eye: Secondary | ICD-10-CM | POA: Insufficient documentation

## 2022-04-07 NOTE — Progress Notes (Signed)
Jasmine Long 72 y.o.   Chief Complaint  Patient presents with   Acute Visit    Patient wants to be referred to a retina surgeon, has a broken blood vessel in right eye,   Both ears feels like " water" is in them      HISTORY OF PRESENT ILLNESS: This is a 72 y.o. female complaining of floaters to right eye noticed 1 week ago Saw optometrist.  Referred to Sherlynn Stalls at [facility]. Also complaining of feeling like water in both ears. No other complaints or medical concerns today.  HPI   Prior to Admission medications   Medication Sig Start Date End Date Taking? Authorizing Provider  ALPRAZolam (XANAX) 0.5 MG tablet TAKE 1 TABLET BY MOUTH ONCE DAILY AS NEEDED FOR ANXIETY 03/20/22  Yes Burns, Claudina Lick, MD  atorvastatin (LIPITOR) 20 MG tablet Take 1 tablet (20 mg total) by mouth daily. 08/04/21  Yes Horald Pollen, MD  buPROPion (WELLBUTRIN XL) 300 MG 24 hr tablet Take 1 tablet (300 mg total) by mouth daily. 02/15/21  Yes Levii Hairfield, Ines Bloomer, MD  clonazePAM (KLONOPIN) 0.5 MG tablet Take 0.5 mg by mouth 2 (two) times daily as needed for anxiety.   Yes [provider]  escitalopram (LEXAPRO) 10 MG tablet Take 1 tablet (10 mg total) by mouth daily. 12/25/21 12/20/22 Yes DodgeInes Bloomer, MD  levothyroxine (SYNTHROID) 125 MCG tablet Take 1 tablet (125 mcg total) by mouth daily before breakfast. 03/07/21  Yes Damonte Frieson, Ines Bloomer, MD  metFORMIN (GLUCOPHAGE) 1000 MG tablet Take 1 tablet (1,000 mg total) by mouth 2 (two) times daily with a meal. 02/15/21  Yes Saffron Busey, Ines Bloomer, MD  metoprolol succinate (TOPROL-XL) 25 MG 24 hr tablet Take 1 tablet (25 mg total) by mouth daily. 02/15/21  Yes Rhealyn Cullen, Ines Bloomer, MD  nicotine (NICODERM CQ - DOSED IN MG/24 HOURS) 21 mg/24hr patch Place 1 patch (21 mg total) onto the skin daily. 04/20/18  Yes Prem Coykendall, Ines Bloomer, MD  valsartan-hydrochlorothiazide (DIOVAN-HCT) 160-12.5 MG tablet Take 1 tablet by mouth daily. 03/19/21  Yes  Horald Pollen, MD    No Known Allergies  Patient Active Problem List   Diagnosis Date Noted   Hematoma of scalp 12/25/2021   Smokers' cough ([place]) 12/26/2020   Chronic bilateral low back pain with left-sided sciatica 12/26/2020   Chronic sinus complaints 12/26/2020   Chronic anxiety 06/28/2019   History of peptic ulcer 04/30/2017   Hyperlipidemia 12/11/2015   Insomnia 12/11/2015   Panic attack as reaction to stress 12/11/2015   Current smoker 12/11/2015   Chronic sinusitis 12/11/2015   Dyslipidemia associated with type 2 diabetes mellitus ([place]) 05/15/2015   Hypothyroid 04/29/2011   BMI 40.0-44.9, adult ([place]) 04/29/2011   Depression with anxiety 04/29/2011   GERD (gastroesophageal reflux disease) 04/29/2011   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   Nicotine addiction 04/29/2011   Essential hypertension 04/29/2011   Murmur, cardiac 04/29/2011    Past Medical History:  Diagnosis Date   Allergy    Anxiety    Depression    Heart murmur    Hypertension    Thyroid disease    Ulcer     Past Surgical History:  Procedure Laterality Date   BREAST SURGERY     CESAREAN SECTION     CHOLECYSTECTOMY     NASAL SEPTUM SURGERY      Social History   Socioeconomic History   Marital status: Divorced    Spouse name: Not on file   Number  of children: 2   Years of education: Not on file   Highest education level: Not on file  Occupational History   Occupation: Unemployed  Tobacco Use   Smoking status: Every Day    Packs/day: 1.50    Years: 40.00    Total pack years: 60.00    Types: Cigarettes    Last attempt to quit: 01/20/2012    Years since quitting: 10.2   Smokeless tobacco: Never   Tobacco comments:    Pt doing E-cigarette as of 01/20/12  Vaping Use   Vaping Use: Former  Substance and Sexual Activity   Alcohol use: No   Drug use: No   Sexual activity: Never  Other Topics Concern   Not on file  Social History Narrative   Patient on worker's compensation for an  injury sustained on the job 5 years ago.      Right handed and Left Handed       Lives in a two story home       One son deceased.    Social Determinants of Health   Financial Resource Strain: Low Risk  (05/05/2021)   Overall Financial Resource Strain (CARDIA)    Difficulty of Paying Living Expenses: Not hard at all  Food Insecurity: No Food Insecurity (05/05/2021)   Hunger Vital Sign    Worried About Running Out of Food in the Last Year: Never true    Ran Out of Food in the Last Year: Never true  Transportation Needs: No Transportation Needs (05/05/2021)   PRAPARE - Hydrologist (Medical): No    Lack of Transportation (Non-Medical): No  Physical Activity: Inactive (05/05/2021)   Exercise Vital Sign    Days of Exercise per Week: 0 days    Minutes of Exercise per Session: 0 min  Stress: No Stress Concern Present (05/05/2021)   [place]    Feeling of Stress : Not at all  Social Connections: Socially Isolated (05/05/2021)   Social Connection and Isolation Panel [NHANES]    Frequency of Communication with Friends and Family: Twice a week    Frequency of Social Gatherings with Friends and Family: Twice a week    Attends Religious Services: Never    Marine scientist or Organizations: No    Attends Archivist Meetings: Never    Marital Status: Divorced  Human resources officer Violence: Not At Risk (05/05/2021)   Humiliation, Afraid, Rape, and Kick questionnaire    Fear of Current or Ex-Partner: No    Emotionally Abused: No    Physically Abused: No    Sexually Abused: No    Family History  Problem Relation Age of Onset   Lung cancer Mother    Dementia Father    Atrial fibrillation Father      Review of Systems  Constitutional: Negative.  Negative for chills and fever.  HENT:  Positive for congestion.   Eyes:  Positive for blurred vision. Negative for pain, discharge and  redness.  Respiratory: Negative.  Negative for cough and shortness of breath.   Cardiovascular: Negative.  Negative for chest pain and palpitations.  Gastrointestinal:  Negative for abdominal pain, nausea and vomiting.  Genitourinary: Negative.  Negative for dysuria and hematuria.  Skin: Negative.  Negative for rash.  Neurological:  Negative for dizziness and headaches.  All other systems reviewed and are negative.   Vitals:   04/07/22 1433  BP: 136/76  Pulse: 95  Temp:  98.7 F (37.1 C)  SpO2: 96%    Physical Exam Vitals reviewed.  Constitutional:      Appearance: Normal appearance.  HENT:     Head: Normocephalic.     Right Ear: Tympanic membrane, ear canal and external ear normal.     Left Ear: Tympanic membrane, ear canal and external ear normal.     Mouth/Throat:     Mouth: Mucous membranes are moist.     Pharynx: Oropharynx is clear.  Eyes:     Extraocular Movements: Extraocular movements intact.     Conjunctiva/sclera: Conjunctivae normal.     Pupils: Pupils are equal, round, and reactive to light.  Cardiovascular:     Rate and Rhythm: Normal rate and regular rhythm.     Pulses: Normal pulses.     Heart sounds: Normal heart sounds.  Pulmonary:     Effort: Pulmonary effort is normal.     Breath sounds: Normal breath sounds.  Musculoskeletal:     Cervical back: No tenderness.  Lymphadenopathy:     Cervical: No cervical adenopathy.  Skin:    General: Skin is warm and dry.     Capillary Refill: Capillary refill takes less than 2 seconds.  Neurological:     General: No focal deficit present.     Mental Status: She is alert and oriented to person, place, and time.  Psychiatric:        Mood and Affect: Mood normal.        Behavior: Behavior normal.      ASSESSMENT & PLAN: A total of 32 minutes was spent with the patient and counseling/coordination of care regarding preparing for this visit, review of most recent office visit notes, review of chronic medical  conditions under management, review of all medications, diagnosis of floaters and need for ophthalmology evaluation, prognosis, documentation and need for follow-up.  Problem List Items Addressed This Visit       Other   Vitreous floaters of right eye - Primary    Stable and not getting worse. Needs ophthalmology evaluation Referral placed today. ED precautions given.      Relevant Orders   Ambulatory referral to Ophthalmology   Patient Instructions  Eye Floaters Eye floaters are spots in your vision caused by shadows from specks of material that float inside your eye. Floaters may be more obvious when you look up at the sky or at a bright, blank background. Floaters do not usually cause vision problems, but it is still important to get an eye exam to make sure that they are not a sign of a more serious condition. What are the causes? In most cases, this condition is caused by age-related changes in the eye. As you age, the jelly-like fluid (vitreous) inside the eyeball shrinks and can become stringy. The stringy strands of vitreous cast shadows on the back of the eye (retina). These shadows show up as floaters in your vision. Other possible causes of eye floaters include: A torn retina. Bleeding inside the eye. Diabetes and other conditions can cause blood vessels in the retina to bleed. A blood clot in the major vein of the retina or its branches (retinal vein occlusion). Separation (detachment) of the: Vitreous. Retina. Eye inflammation (uveitis). Eye infection. What increases the risk? You are more likely to develop this condition if: You are older. The risk increases with age. You have nearsightedness. You have diabetes. You have had cataracts removed, or other eye surgery. You have or have had an injury or trauma  to the eye. What are the signs or symptoms? The main symptom of this condition is the appearance of small, shadowy shapes moving across your vision. These shapes  are called floaters. The shapes move as your eyes move. They often drift out of your central vision when you keep your eyes still. These shapes may look like: Specks. Dots. Circles. Squiggly lines. Thread. Sometimes floaters appear along with flashes. Flashes usually occur at the edge of your vision. They may look like: Bursts of light. Flashing lights. Lightning streaks. What is commonly referred to as "stars." In some cases, seeing flashes can be a sign of a torn or detached retina. This is a serious condition that requires emergency evaluation. How is this diagnosed? This condition may be diagnosed based on: Your symptoms and medical history. An exam by a health care provider who specializes in conditions and diseases of the eye (ophthalmologist). The exam may include: Putting eye drops in your eye to make the pupil wider (dilated). The pupil is the opening in the center of the eye. Checking the back of your eye with a microscope. This exam is the best way to determine whether your floaters are a normal part of aging or a warning sign of a more serious eye problem. Imaging tests such as an ultrasound. This may be needed if the specialist is unable to see well enough with the microscopes. How is this treated? Treatment for this condition may depend on the cause of the condition. If your floaters are the result of aging, you do not need treatment unless they start to affect your vision. Floaters often improve but may not go away completely. Most people adapt to the floaters or, over time, the floaters settle below the line of sight. If a retinal tear or detachment is causing your floaters, you may need surgery. If you have an underlying condition that is causing floaters, such as an infection, the floaters should go away when that condition is treated. In rare cases, if the floaters are affecting your vision, surgery to remove the vitreous and replace it with a saltwater solution  (vitrectomy) may be considered. Follow these instructions at home: Take over-the-counter and prescription medicines only as told by your health care provider. Do not drive if you have trouble seeing. Ask your health care provider for guidance about when it is safe for you to drive. Keep all follow-up visits. This is important. Contact a health care provider if: You cannot see well because of your floaters. You develop any new symptoms. Get help right away if: You develop a sudden increase in the number of floaters you see. You develop flashes along with floaters. It looks as if a curtain is blocking part of your vision. Your vision suddenly changes, or you lose your vision completely. These symptoms may be an emergency. Get help right away. Call 911. Do not wait to see if the symptoms will go away. Do not drive yourself to the hospital. Summary Eye floaters are spots in your vision caused by specks of material that float around inside your eye. In most cases, eye floaters are caused by age-related changes in the eye. Most people do not need treatment for eye floaters unless there is another condition causing the floaters, such as a retinal tear or detachment or an infection. If you develop a sudden increase in the number of floaters, see flashes, or notice a curtain blocking part of your vision, you should see an eye care provider right away. This  information is not intended to replace advice given to you by your health care provider. Make sure you discuss any questions you have with your health care provider. Document Revised: 07/31/2020 Document Reviewed: 07/31/2020 Elsevier Patient Education  [place], MD [facility]

## 2022-04-07 NOTE — Patient Instructions (Signed)
Eye Floaters Eye floaters are spots in your vision caused by shadows from specks of material that float inside your eye. Floaters may be more obvious when you look up at the sky or at a bright, blank background. Floaters do not usually cause vision problems, but it is still important to get an eye exam to make sure that they are not a sign of a more serious condition. What are the causes? In most cases, this condition is caused by age-related changes in the eye. As you age, the jelly-like fluid (vitreous) inside the eyeball shrinks and can become stringy. The stringy strands of vitreous cast shadows on the back of the eye (retina). These shadows show up as floaters in your vision. Other possible causes of eye floaters include: A torn retina. Bleeding inside the eye. Diabetes and other conditions can cause blood vessels in the retina to bleed. A blood clot in the major vein of the retina or its branches (retinal vein occlusion). Separation (detachment) of the: Vitreous. Retina. Eye inflammation (uveitis). Eye infection. What increases the risk? You are more likely to develop this condition if: You are older. The risk increases with age. You have nearsightedness. You have diabetes. You have had cataracts removed, or other eye surgery. You have or have had an injury or trauma to the eye. What are the signs or symptoms? The main symptom of this condition is the appearance of small, shadowy shapes moving across your vision. These shapes are called floaters. The shapes move as your eyes move. They often drift out of your central vision when you keep your eyes still. These shapes may look like: Specks. Dots. Circles. Squiggly lines. Thread. Sometimes floaters appear along with flashes. Flashes usually occur at the edge of your vision. They may look like: Bursts of light. Flashing lights. Lightning streaks. What is commonly referred to as "stars." In some cases, seeing flashes can be a sign  of a torn or detached retina. This is a serious condition that requires emergency evaluation. How is this diagnosed? This condition may be diagnosed based on: Your symptoms and medical history. An exam by a health care provider who specializes in conditions and diseases of the eye (ophthalmologist). The exam may include: Putting eye drops in your eye to make the pupil wider (dilated). The pupil is the opening in the center of the eye. Checking the back of your eye with a microscope. This exam is the best way to determine whether your floaters are a normal part of aging or a warning sign of a more serious eye problem. Imaging tests such as an ultrasound. This may be needed if the specialist is unable to see well enough with the microscopes. How is this treated? Treatment for this condition may depend on the cause of the condition. If your floaters are the result of aging, you do not need treatment unless they start to affect your vision. Floaters often improve but may not go away completely. Most people adapt to the floaters or, over time, the floaters settle below the line of sight. If a retinal tear or detachment is causing your floaters, you may need surgery. If you have an underlying condition that is causing floaters, such as an infection, the floaters should go away when that condition is treated. In rare cases, if the floaters are affecting your vision, surgery to remove the vitreous and replace it with a saltwater solution (vitrectomy) may be considered. Follow these instructions at home: Take over-the-counter and prescription medicines only  as told by your health care provider. Do not drive if you have trouble seeing. Ask your health care provider for guidance about when it is safe for you to drive. Keep all follow-up visits. This is important. Contact a health care provider if: You cannot see well because of your floaters. You develop any new symptoms. Get help right away if: You  develop a sudden increase in the number of floaters you see. You develop flashes along with floaters. It looks as if a curtain is blocking part of your vision. Your vision suddenly changes, or you lose your vision completely. These symptoms may be an emergency. Get help right away. Call 911. Do not wait to see if the symptoms will go away. Do not drive yourself to the hospital. Summary Eye floaters are spots in your vision caused by specks of material that float around inside your eye. In most cases, eye floaters are caused by age-related changes in the eye. Most people do not need treatment for eye floaters unless there is another condition causing the floaters, such as a retinal tear or detachment or an infection. If you develop a sudden increase in the number of floaters, see flashes, or notice a curtain blocking part of your vision, you should see an eye care provider right away. This information is not intended to replace advice given to you by your health care provider. Make sure you discuss any questions you have with your health care provider. Document Revised: 07/31/2020 Document Reviewed: 07/31/2020 Elsevier Patient Education  [place].

## 2022-04-07 NOTE — Assessment & Plan Note (Signed)
Stable and not getting worse. Needs ophthalmology evaluation Referral placed today. ED precautions given.

## 2022-04-13 ENCOUNTER — Other Ambulatory Visit: Payer: Self-pay | Admitting: Emergency Medicine

## 2022-04-13 ENCOUNTER — Telehealth: Payer: Self-pay | Admitting: Emergency Medicine

## 2022-04-13 DIAGNOSIS — E079 Disorder of thyroid, unspecified: Secondary | ICD-10-CM

## 2022-04-13 NOTE — Telephone Encounter (Signed)
Patient said she would like a referral to a dermatologist. She said she would like to get some moles removed. She was informed that she may need to schedule an appointment with PCP for referral. Best callback is [phone].

## 2022-04-13 NOTE — Telephone Encounter (Signed)
Called patient and informed her that she will be need to be seen for an appointment for dermatology

## 2022-04-22 ENCOUNTER — Ambulatory Visit (INDEPENDENT_AMBULATORY_CARE_PROVIDER_SITE_OTHER): Payer: Medicare HMO | Admitting: Emergency Medicine

## 2022-04-22 ENCOUNTER — Encounter: Payer: Self-pay | Admitting: Emergency Medicine

## 2022-04-22 VITALS — BP 134/82 | HR 103 | Temp 98.5°F | Ht 69.0 in | Wt 221.4 lb

## 2022-04-22 DIAGNOSIS — L819 Disorder of pigmentation, unspecified: Secondary | ICD-10-CM

## 2022-04-22 DIAGNOSIS — G8929 Other chronic pain: Secondary | ICD-10-CM

## 2022-04-22 DIAGNOSIS — E1169 Type 2 diabetes mellitus with other specified complication: Secondary | ICD-10-CM

## 2022-04-22 DIAGNOSIS — R202 Paresthesia of skin: Secondary | ICD-10-CM | POA: Diagnosis not present

## 2022-04-22 DIAGNOSIS — H43391 Other vitreous opacities, right eye: Secondary | ICD-10-CM

## 2022-04-22 DIAGNOSIS — R937 Abnormal findings on diagnostic imaging of other parts of musculoskeletal system: Secondary | ICD-10-CM | POA: Diagnosis not present

## 2022-04-22 DIAGNOSIS — H2513 Age-related nuclear cataract, bilateral: Secondary | ICD-10-CM | POA: Diagnosis not present

## 2022-04-22 DIAGNOSIS — E119 Type 2 diabetes mellitus without complications: Secondary | ICD-10-CM | POA: Diagnosis not present

## 2022-04-22 DIAGNOSIS — H43811 Vitreous degeneration, right eye: Secondary | ICD-10-CM | POA: Diagnosis not present

## 2022-04-22 DIAGNOSIS — M5442 Lumbago with sciatica, left side: Secondary | ICD-10-CM

## 2022-04-22 DIAGNOSIS — I10 Essential (primary) hypertension: Secondary | ICD-10-CM | POA: Diagnosis not present

## 2022-04-22 DIAGNOSIS — E785 Hyperlipidemia, unspecified: Secondary | ICD-10-CM

## 2022-04-22 DIAGNOSIS — H3561 Retinal hemorrhage, right eye: Secondary | ICD-10-CM | POA: Diagnosis not present

## 2022-04-22 NOTE — Progress Notes (Signed)
Jasmine Long 72 y.o.   Chief Complaint  Patient presents with   Referral    Referral to Dermatology   Back Pain    Back pain and numbness     HISTORY OF PRESENT ILLNESS: This is a 72 y.o. female with history of multiple hyperpigmented moles on her skin.  Requesting dermatology referral. Also has history of chronic low back pain with sciatica and abnormal lumbar spine MRIs in the past.  Complaining of increased numbness to both legs over the past several weeks. Seen by me on 04/13/2022 complaining of visual floaters.  Has appointment to see ophthalmologist today at 2:15 PM. No other complaints or medical concerns today.  Back Pain Pertinent negatives include no abdominal pain, chest pain, fever or headaches.     Prior to Admission medications   Medication Sig Start Date End Date Taking? Authorizing Provider  ALPRAZolam (XANAX) 0.5 MG tablet TAKE 1 TABLET BY MOUTH ONCE DAILY AS NEEDED FOR ANXIETY 03/20/22  Yes Burns, Claudina Lick, MD  atorvastatin (LIPITOR) 20 MG tablet Take 1 tablet (20 mg total) by mouth daily. 08/04/21  Yes Horald Pollen, MD  buPROPion (WELLBUTRIN XL) 300 MG 24 hr tablet Take 1 tablet (300 mg total) by mouth daily. 02/15/21  Yes Shakari Qazi, Ines Bloomer, MD  clonazePAM (KLONOPIN) 0.5 MG tablet Take 0.5 mg by mouth 2 (two) times daily as needed for anxiety.   Yes [provider]  escitalopram (LEXAPRO) 10 MG tablet Take 1 tablet (10 mg total) by mouth daily. 12/25/21 12/20/22 Yes [place], Ines Bloomer, MD  levothyroxine (SYNTHROID) 125 MCG tablet TAKE 1 TABLET BY MOUTH ONCE DAILY BEFORE BREAKFAST 04/14/22  Yes Horald Pollen, MD  metFORMIN (GLUCOPHAGE) 1000 MG tablet Take 1 tablet (1,000 mg total) by mouth 2 (two) times daily with a meal. 02/15/21  Yes Kain Milosevic, Ines Bloomer, MD  metoprolol succinate (TOPROL-XL) 25 MG 24 hr tablet Take 1 tablet (25 mg total) by mouth daily. 02/15/21  Yes Sheniya Garciaperez, Ines Bloomer, MD  nicotine (NICODERM CQ - DOSED IN MG/24 HOURS)  21 mg/24hr patch Place 1 patch (21 mg total) onto the skin daily. 04/20/18  Yes Kamaryn Grimley, Ines Bloomer, MD  valsartan-hydrochlorothiazide (DIOVAN-HCT) 160-12.5 MG tablet Take 1 tablet by mouth daily. 03/19/21  Yes Horald Pollen, MD    No Known Allergies  Patient Active Problem List   Diagnosis Date Noted   Vitreous floaters of right eye 04/07/2022   Hematoma of scalp 12/25/2021   Smokers' cough ([place]) 12/26/2020   Chronic bilateral low back pain with left-sided sciatica 12/26/2020   Chronic sinus complaints 12/26/2020   Chronic anxiety 06/28/2019   History of peptic ulcer 04/30/2017   Hyperlipidemia 12/11/2015   Insomnia 12/11/2015   Panic attack as reaction to stress 12/11/2015   Current smoker 12/11/2015   Chronic sinusitis 12/11/2015   Dyslipidemia associated with type 2 diabetes mellitus ([place]) 05/15/2015   Hypothyroid 04/29/2011   BMI 40.0-44.9, adult ([place]) 04/29/2011   Depression with anxiety 04/29/2011   GERD (gastroesophageal reflux disease) 04/29/2011   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   Nicotine addiction 04/29/2011   Essential hypertension 04/29/2011   Murmur, cardiac 04/29/2011    Past Medical History:  Diagnosis Date   Allergy    Anxiety    Depression    Heart murmur    Hypertension    Thyroid disease    Ulcer     Past Surgical History:  Procedure Laterality Date   BREAST SURGERY     CESAREAN SECTION  CHOLECYSTECTOMY     NASAL SEPTUM SURGERY      Social History   Socioeconomic History   Marital status: Divorced    Spouse name: Not on file   Number of children: 2   Years of education: Not on file   Highest education level: Not on file  Occupational History   Occupation: Unemployed  Tobacco Use   Smoking status: Every Day    Packs/day: 1.50    Years: 40.00    Additional pack years: 0.00    Total pack years: 60.00    Types: Cigarettes    Last attempt to quit: 01/20/2012    Years since quitting: 10.2   Smokeless tobacco: Never    Tobacco comments:    Pt doing E-cigarette as of 01/20/12  Vaping Use   Vaping Use: Former  Substance and Sexual Activity   Alcohol use: No   Drug use: No   Sexual activity: Never  Other Topics Concern   Not on file  Social History Narrative   Patient on worker's compensation for an injury sustained on the job 5 years ago.      Right handed and Left Handed       Lives in a two story home       One son deceased.    Social Determinants of Health   Financial Resource Strain: Low Risk  (05/05/2021)   Overall Financial Resource Strain (CARDIA)    Difficulty of Paying Living Expenses: Not hard at all  Food Insecurity: No Food Insecurity (05/05/2021)   Hunger Vital Sign    Worried About Running Out of Food in the Last Year: Never true    Ran Out of Food in the Last Year: Never true  Transportation Needs: No Transportation Needs (05/05/2021)   PRAPARE - Hydrologist (Medical): No    Lack of Transportation (Non-Medical): No  Physical Activity: Inactive (05/05/2021)   Exercise Vital Sign    Days of Exercise per Week: 0 days    Minutes of Exercise per Session: 0 min  Stress: No Stress Concern Present (05/05/2021)   [place]    Feeling of Stress : Not at all  Social Connections: Socially Isolated (05/05/2021)   Social Connection and Isolation Panel [NHANES]    Frequency of Communication with Friends and Family: Twice a week    Frequency of Social Gatherings with Friends and Family: Twice a week    Attends Religious Services: Never    Marine scientist or Organizations: No    Attends Archivist Meetings: Never    Marital Status: Divorced  Human resources officer Violence: Not At Risk (05/05/2021)   Humiliation, Afraid, Rape, and Kick questionnaire    Fear of Current or Ex-Partner: No    Emotionally Abused: No    Physically Abused: No    Sexually Abused: No    Family History   Problem Relation Age of Onset   Lung cancer Mother    Dementia Father    Atrial fibrillation Father      Review of Systems  Constitutional: Negative.  Negative for chills and fever.  HENT:  Negative for congestion and sore throat.   Respiratory: Negative.  Negative for cough and shortness of breath.   Cardiovascular: Negative.  Negative for chest pain and palpitations.  Gastrointestinal:  Negative for abdominal pain, diarrhea, nausea and vomiting.  Musculoskeletal:  Positive for back pain.  Skin: Negative.  Negative for rash.  Neurological:  Positive for sensory change. Negative for dizziness and headaches.  All other systems reviewed and are negative.   Vitals:   04/22/22 1111  BP: 134/82  Pulse: (!) 103  Temp: 98.5 F (36.9 C)  SpO2: 95%    Physical Exam Vitals reviewed.  Constitutional:      Appearance: Normal appearance.  HENT:     Head: Normocephalic.  Eyes:     Extraocular Movements: Extraocular movements intact.  Cardiovascular:     Rate and Rhythm: Normal rate.  Pulmonary:     Effort: Pulmonary effort is normal.  Skin:    General: Skin is warm and dry.     Findings: Lesion present.     Comments: Several hyperpigmented moles on different places  Neurological:     Mental Status: She is alert and oriented to person, place, and time.     Sensory: No sensory deficit.     Motor: No weakness.     Coordination: Coordination normal.  Psychiatric:        Mood and Affect: Mood normal.        Behavior: Behavior normal.      ASSESSMENT & PLAN: A total of 43 minutes was spent with the patient and counseling/coordination of care regarding preparing for this visit, review of most recent office visit notes, review of multiple chronic medical conditions and their management, review of all medications, pain management, prognosis, documentation, smoking cessation advice, and need for follow-up.  Problem List Items Addressed This Visit       Cardiovascular and  Mediastinum   Essential hypertension    Well-controlled hypertension BP Readings from Last 3 Encounters:  04/22/22 134/82  04/07/22 136/76  12/25/21 (!) 146/72  Continue Diovan HCT 160-12.5 mg daily and metoprolol succinate 25 mg daily         Endocrine   Dyslipidemia associated with type 2 diabetes mellitus (HCC)    Stable chronic conditions Continue metformin 1000 mg twice a day and atorvastatin 20 mg daily Lab Results  Component Value Date   HGBA1C 6.1 (A) 09/08/2021  The 10-year ASCVD risk score (Arnett DK, et al., 2019) is: 35%   Values used to calculate the score:     Age: 23 years     Sex: Female     Is Non-Hispanic African American: No     Diabetic: Yes     Tobacco smoker: Yes     Systolic Blood Pressure: Q000111Q mmHg     Is BP treated: Yes     HDL Cholesterol: 51 mg/dL     Total Cholesterol: 189 mg/dL         Nervous and Auditory   Chronic bilateral low back pain with left-sided sciatica    Pain management discussed Intermittent symptoms Abnormal MRI in the past Recommend neurosurgical and spine evaluation Referral placed today.      Relevant Orders   Ambulatory referral to Neurosurgery     Musculoskeletal and Integument   Change in multiple pigmented skin lesions - Primary    Needs dermatology evaluation Referral placed today      Relevant Orders   Ambulatory referral to Dermatology     Other   Vitreous floaters of right eye    Still happening. Scheduled for ophthalmology evaluation this afternoon      Paresthesia of both lower extremities    Secondary to lumbar spine pathology Needs neurosurgical evaluation Referral placed today      Relevant Orders   Ambulatory referral to Neurosurgery  Abnormal MRI, lumbar spine   Relevant Orders   Ambulatory referral to Neurosurgery   Patient Instructions  Health Maintenance After Age 20 After age 79, you are at a higher risk for certain long-term diseases and infections as well as injuries from  falls. Falls are a major cause of broken bones and head injuries in people who are older than age 62. Getting regular preventive care can help to keep you healthy and well. Preventive care includes getting regular testing and making lifestyle changes as recommended by your health care provider. Talk with your health care provider about: Which screenings and tests you should have. A screening is a test that checks for a disease when you have no symptoms. A diet and exercise plan that is right for you. What should I know about screenings and tests to prevent falls? Screening and testing are the best ways to find a health problem early. Early diagnosis and treatment give you the best chance of managing medical conditions that are common after age 2. Certain conditions and lifestyle choices may make you more likely to have a fall. Your health care provider may recommend: Regular vision checks. Poor vision and conditions such as cataracts can make you more likely to have a fall. If you wear glasses, make sure to get your prescription updated if your vision changes. Medicine review. Work with your health care provider to regularly review all of the medicines you are taking, including over-the-counter medicines. Ask your health care provider about any side effects that may make you more likely to have a fall. Tell your health care provider if any medicines that you take make you feel dizzy or sleepy. Strength and balance checks. Your health care provider may recommend certain tests to check your strength and balance while standing, walking, or changing positions. Foot health exam. Foot pain and numbness, as well as not wearing proper footwear, can make you more likely to have a fall. Screenings, including: Osteoporosis screening. Osteoporosis is a condition that causes the bones to get weaker and break more easily. Blood pressure screening. Blood pressure changes and medicines to control blood pressure can make  you feel dizzy. Depression screening. You may be more likely to have a fall if you have a fear of falling, feel depressed, or feel unable to do activities that you used to do. Alcohol use screening. Using too much alcohol can affect your balance and may make you more likely to have a fall. Follow these instructions at home: Lifestyle Do not drink alcohol if: Your health care provider tells you not to drink. If you drink alcohol: Limit how much you have to: 0-1 drink a day for women. 0-2 drinks a day for men. Know how much alcohol is in your drink. In the [place] one drink equals one 12 oz bottle of beer (355 mL), one 5 oz glass of wine (148 mL), or one 1 oz glass of hard liquor (44 mL). Do not use any products that contain nicotine or tobacco. These products include cigarettes, chewing tobacco, and vaping devices, such as e-cigarettes. If you need help quitting, ask your health care provider. Activity  Follow a regular exercise program to stay fit. This will help you maintain your balance. Ask your health care provider what types of exercise are appropriate for you. If you need a cane or walker, use it as recommended by your health care provider. Wear supportive shoes that have nonskid soles. Safety  Remove any tripping hazards, such as rugs, cords,  and clutter. Install safety equipment such as grab bars in bathrooms and safety rails on stairs. Keep rooms and walkways well-lit. General instructions Talk with your health care provider about your risks for falling. Tell your health care provider if: You fall. Be sure to tell your health care provider about all falls, even ones that seem minor. You feel dizzy, tiredness (fatigue), or off-balance. Take over-the-counter and prescription medicines only as told by your health care provider. These include supplements. Eat a healthy diet and maintain a healthy weight. A healthy diet includes low-fat dairy products, low-fat (lean) meats, and fiber  from whole grains, beans, and lots of fruits and vegetables. Stay current with your vaccines. Schedule regular health, dental, and eye exams. Summary Having a healthy lifestyle and getting preventive care can help to protect your health and wellness after age 28. Screening and testing are the best way to find a health problem early and help you avoid having a fall. Early diagnosis and treatment give you the best chance for managing medical conditions that are more common for people who are older than age 52. Falls are a major cause of broken bones and head injuries in people who are older than age 65. Take precautions to prevent a fall at home. Work with your health care provider to learn what changes you can make to improve your health and wellness and to prevent falls. This information is not intended to replace advice given to you by your health care provider. Make sure you discuss any questions you have with your health care provider. Document Revised: 06/03/2020 Document Reviewed: 06/03/2020 Elsevier Patient Education  [place], MD [facility]

## 2022-04-22 NOTE — Assessment & Plan Note (Signed)
Secondary to lumbar spine pathology Needs neurosurgical evaluation Referral placed today

## 2022-04-22 NOTE — Assessment & Plan Note (Signed)
Well-controlled hypertension BP Readings from Last 3 Encounters:  04/22/22 134/82  04/07/22 136/76  12/25/21 (!) 146/72  Continue Diovan HCT 160-12.5 mg daily and metoprolol succinate 25 mg daily

## 2022-04-22 NOTE — Assessment & Plan Note (Signed)
Stable chronic conditions Continue metformin 1000 mg twice a day and atorvastatin 20 mg daily Lab Results  Component Value Date   HGBA1C 6.1 (A) 09/08/2021   The 10-year ASCVD risk score (Arnett DK, et al., 2019) is: 35%   Values used to calculate the score:     Age: 72 years     Sex: Female     Is Non-Hispanic African American: No     Diabetic: Yes     Tobacco smoker: Yes     Systolic Blood Pressure: Q000111Q mmHg     Is BP treated: Yes     HDL Cholesterol: 51 mg/dL     Total Cholesterol: 189 mg/dL

## 2022-04-22 NOTE — Assessment & Plan Note (Signed)
Needs dermatology evaluation.  Referral placed today. °

## 2022-04-22 NOTE — Patient Instructions (Signed)
Health Maintenance After Age 70 After age 70, you are at a higher risk for certain long-term diseases and infections as well as injuries from falls. Falls are a major cause of broken bones and head injuries in people who are older than age 70. Getting regular preventive care can help to keep you healthy and well. Preventive care includes getting regular testing and making lifestyle changes as recommended by your health care provider. Talk with your health care provider about: Which screenings and tests you should have. A screening is a test that checks for a disease when you have no symptoms. A diet and exercise plan that is right for you. What should I know about screenings and tests to prevent falls? Screening and testing are the best ways to find a health problem early. Early diagnosis and treatment give you the best chance of managing medical conditions that are common after age 70. Certain conditions and lifestyle choices may make you more likely to have a fall. Your health care provider may recommend: Regular vision checks. Poor vision and conditions such as cataracts can make you more likely to have a fall. If you wear glasses, make sure to get your prescription updated if your vision changes. Medicine review. Work with your health care provider to regularly review all of the medicines you are taking, including over-the-counter medicines. Ask your health care provider about any side effects that may make you more likely to have a fall. Tell your health care provider if any medicines that you take make you feel dizzy or sleepy. Strength and balance checks. Your health care provider may recommend certain tests to check your strength and balance while standing, walking, or changing positions. Foot health exam. Foot pain and numbness, as well as not wearing proper footwear, can make you more likely to have a fall. Screenings, including: Osteoporosis screening. Osteoporosis is a condition that causes  the bones to get weaker and break more easily. Blood pressure screening. Blood pressure changes and medicines to control blood pressure can make you feel dizzy. Depression screening. You may be more likely to have a fall if you have a fear of falling, feel depressed, or feel unable to do activities that you used to do. Alcohol use screening. Using too much alcohol can affect your balance and may make you more likely to have a fall. Follow these instructions at home: Lifestyle Do not drink alcohol if: Your health care provider tells you not to drink. If you drink alcohol: Limit how much you have to: 0-1 drink a day for women. 0-2 drinks a day for men. Know how much alcohol is in your drink. In the [place] one drink equals one 12 oz bottle of beer (355 mL), one 5 oz glass of wine (148 mL), or one 1 oz glass of hard liquor (44 mL). Do not use any products that contain nicotine or tobacco. These products include cigarettes, chewing tobacco, and vaping devices, such as e-cigarettes. If you need help quitting, ask your health care provider. Activity  Follow a regular exercise program to stay fit. This will help you maintain your balance. Ask your health care provider what types of exercise are appropriate for you. If you need a cane or walker, use it as recommended by your health care provider. Wear supportive shoes that have nonskid soles. Safety  Remove any tripping hazards, such as rugs, cords, and clutter. Install safety equipment such as grab bars in bathrooms and safety rails on stairs. Keep rooms and walkways   well-lit. General instructions Talk with your health care provider about your risks for falling. Tell your health care provider if: You fall. Be sure to tell your health care provider about all falls, even ones that seem minor. You feel dizzy, tiredness (fatigue), or off-balance. Take over-the-counter and prescription medicines only as told by your health care provider. These include  supplements. Eat a healthy diet and maintain a healthy weight. A healthy diet includes low-fat dairy products, low-fat (lean) meats, and fiber from whole grains, beans, and lots of fruits and vegetables. Stay current with your vaccines. Schedule regular health, dental, and eye exams. Summary Having a healthy lifestyle and getting preventive care can help to protect your health and wellness after age 70. Screening and testing are the best way to find a health problem early and help you avoid having a fall. Early diagnosis and treatment give you the best chance for managing medical conditions that are more common for people who are older than age 70. Falls are a major cause of broken bones and head injuries in people who are older than age 70. Take precautions to prevent a fall at home. Work with your health care provider to learn what changes you can make to improve your health and wellness and to prevent falls. This information is not intended to replace advice given to you by your health care provider. Make sure you discuss any questions you have with your health care provider. Document Revised: 06/03/2020 Document Reviewed: 06/03/2020 Elsevier Patient Education  2023 Elsevier Inc.  

## 2022-04-22 NOTE — Assessment & Plan Note (Signed)
Pain management discussed Intermittent symptoms Abnormal MRI in the past Recommend neurosurgical and spine evaluation Referral placed today.

## 2022-04-22 NOTE — Assessment & Plan Note (Signed)
Still happening. Scheduled for ophthalmology evaluation this afternoon

## 2022-04-23 ENCOUNTER — Telehealth: Payer: Self-pay | Admitting: Family Medicine

## 2022-04-23 NOTE — Telephone Encounter (Signed)
02/05/2020 progress note & xray reports emailed to [facility], Dr. Junius Roads requesting.

## 2022-04-30 ENCOUNTER — Telehealth: Payer: Self-pay

## 2022-04-30 NOTE — Telephone Encounter (Signed)
Called patient to schedule Medicare Annual Wellness Visit (AWV). Left message for patient to call back and schedule Medicare Annual Wellness Visit (AWV).  Last date of AWV: 05/05/21  Please schedule an appointment at any time with [facility] on [facility] Schedule.

## 2022-05-01 NOTE — Telephone Encounter (Signed)
Pt asked if you can give her a call back.

## 2022-05-04 ENCOUNTER — Other Ambulatory Visit: Payer: Self-pay

## 2022-05-04 ENCOUNTER — Emergency Department (HOSPITAL_COMMUNITY)
Admission: EM | Admit: 2022-05-04 | Discharge: 2022-05-04 | Disposition: A | Discharge status: Home / Self Care | Payer: Medicare HMO | Attending: Emergency Medicine | Admitting: Emergency Medicine

## 2022-05-04 ENCOUNTER — Encounter (HOSPITAL_COMMUNITY): Payer: Self-pay

## 2022-05-04 DIAGNOSIS — K5900 Constipation, unspecified: Secondary | ICD-10-CM | POA: Insufficient documentation

## 2022-05-04 DIAGNOSIS — R1084 Generalized abdominal pain: Secondary | ICD-10-CM | POA: Diagnosis not present

## 2022-05-04 LAB — URINALYSIS, ROUTINE W REFLEX MICROSCOPIC
Bilirubin Urine: NEGATIVE
Glucose, UA: NEGATIVE mg/dL
Ketones, ur: 5 mg/dL — AB
Leukocytes,Ua: NEGATIVE
Nitrite: NEGATIVE
Protein, ur: NEGATIVE mg/dL
Specific Gravity, Urine: 1.013 (ref 1.005–1.030)
pH: 5 (ref 5.0–8.0)

## 2022-05-04 LAB — CBC WITH DIFFERENTIAL/PLATELET
Abs Immature Granulocytes: 0.03 10*3/uL (ref 0.00–0.07)
Basophils Absolute: 0 10*3/uL (ref 0.0–0.1)
Basophils Relative: 0 %
Eosinophils Absolute: 0 10*3/uL (ref 0.0–0.5)
Eosinophils Relative: 0 %
HCT: 42.9 % (ref 36.0–46.0)
Hemoglobin: 14.1 g/dL (ref 12.0–15.0)
Immature Granulocytes: 0 %
Lymphocytes Relative: 26 %
Lymphs Abs: 2.7 10*3/uL (ref 0.7–4.0)
MCH: 29.1 pg (ref 26.0–34.0)
MCHC: 32.9 g/dL (ref 30.0–36.0)
MCV: 88.5 fL (ref 80.0–100.0)
Monocytes Absolute: 1 10*3/uL (ref 0.1–1.0)
Monocytes Relative: 9 %
Neutro Abs: 6.7 10*3/uL (ref 1.7–7.7)
Neutrophils Relative %: 65 %
Platelets: 415 10*3/uL — ABNORMAL HIGH (ref 150–400)
RBC: 4.85 MIL/uL (ref 3.87–5.11)
RDW: 14.6 % (ref 11.5–15.5)
WBC: 10.4 10*3/uL (ref 4.0–10.5)
nRBC: 0 % (ref 0.0–0.2)

## 2022-05-04 LAB — BASIC METABOLIC PANEL
Anion gap: 10 (ref 5–15)
BUN: 14 mg/dL (ref 8–23)
CO2: 24 mmol/L (ref 22–32)
Calcium: 9.5 mg/dL (ref 8.9–10.3)
Chloride: 99 mmol/L (ref 98–111)
Creatinine, Ser: 1.02 mg/dL — ABNORMAL HIGH (ref 0.44–1.00)
GFR, Estimated: 59 mL/min — ABNORMAL LOW (ref >60)
Glucose, Bld: 141 mg/dL — ABNORMAL HIGH (ref 70–99)
Potassium: 3.8 mmol/L (ref 3.5–5.1)
Sodium: 133 mmol/L — ABNORMAL LOW (ref 135–145)

## 2022-05-04 MED ORDER — SODIUM CHLORIDE 0.9 % IV BOLUS
500.0000 mL | Freq: Once | INTRAVENOUS | Status: AC
  Administered 2022-05-04: 500 mL via INTRAVENOUS

## 2022-05-04 MED ORDER — FLEET ENEMA 7-19 GM/118ML RE ENEM
1.0000 | ENEMA | Freq: Once | RECTAL | Status: AC
  Administered 2022-05-04: 1 via RECTAL
  Filled 2022-05-04: qty 1

## 2022-05-04 MED ORDER — POLYETHYLENE GLYCOL 3350 17 G PO PACK: 17.0000 g | PACK | Freq: Every day | ORAL | 0 refills | Status: DC

## 2022-05-04 NOTE — Discharge Instructions (Signed)
You are seen in the ER for constipation.  You received antibiotic, and as a result had large bowel movement.  We recommend that you take mag citrate for the next 3 days.  Make sure you are hydrating well.

## 2022-05-04 NOTE — ED Provider Notes (Signed)
[place] EMERGENCY DEPARTMENT AT [facility] Provider Note   CSN: [id number] Arrival date & time: 05/04/22  1016     History  Chief Complaint  Patient presents with   Abdominal Pain   Constipation    Jasmine Long is a 72 y.o. female.  HPI    72 year old female comes in with chief complaint of generalized abdominal pain.  Patient states that she has not had a bowel movement in over 10 days.  She is passing flatus, but despite trying stool softeners and laxatives at home she has not had any stool output.  Patient has appetite, she denies any nausea or vomiting.  She denies UTI-like symptoms.  She has constipation routinely, but normally has output in 3 days.  Her last colonoscopy was 15 years ago and it was negative.   Home Medications Prior to Admission medications   Medication Sig Start Date End Date Taking? Authorizing Provider  polyethylene glycol (MIRALAX / GLYCOLAX) 17 g packet Take 17 g by mouth daily. 05/04/22  Yes Loa Idler, Janey Genta, MD  ALPRAZolam (XANAX) 0.5 MG tablet TAKE 1 TABLET BY MOUTH ONCE DAILY AS NEEDED FOR ANXIETY 03/20/22   Burns, Bobette Mo, MD  atorvastatin (LIPITOR) 20 MG tablet Take 1 tablet (20 mg total) by mouth daily. 08/04/21   [name], MD  buPROPion (WELLBUTRIN XL) 300 MG 24 hr tablet Take 1 tablet (300 mg total) by mouth daily. 02/15/21   [name], MD  clonazePAM (KLONOPIN) 0.5 MG tablet Take 0.5 mg by mouth 2 (two) times daily as needed for anxiety.    [provider]  escitalopram (LEXAPRO) 10 MG tablet Take 1 tablet (10 mg total) by mouth daily. 12/25/21 12/20/22  [name], MD  levothyroxine (SYNTHROID) 125 MCG tablet TAKE 1 TABLET BY MOUTH ONCE DAILY BEFORE BREAKFAST 04/14/22   [name], MD  metFORMIN (GLUCOPHAGE) 1000 MG tablet Take 1 tablet (1,000 mg total) by mouth 2 (two) times daily with a meal. 02/15/21   Sagardia, Eilleen Kempf, MD  metoprolol succinate (TOPROL-XL) 25 MG 24 hr  tablet Take 1 tablet (25 mg total) by mouth daily. 02/15/21   [name], MD  nicotine (NICODERM CQ - DOSED IN MG/24 HOURS) 21 mg/24hr patch Place 1 patch (21 mg total) onto the skin daily. 04/20/18   [name], MD  valsartan-hydrochlorothiazide (DIOVAN-HCT) 160-12.5 MG tablet Take 1 tablet by mouth daily. 03/19/21   [name], MD      Allergies    Patient has no known allergies.    Review of Systems   Review of Systems  All other systems reviewed and are negative.   Physical Exam Updated Vital Signs BP (!) 150/77   Pulse (!) 103   Temp 98 F (36.7 C) (Oral)   Resp 18   SpO2 98%  Physical Exam Vitals and nursing note reviewed.  Constitutional:      Appearance: She is well-developed.  HENT:     Head: Atraumatic.  Cardiovascular:     Rate and Rhythm: Normal rate.  Pulmonary:     Effort: Pulmonary effort is normal.  Abdominal:     Tenderness: There is generalized abdominal tenderness. There is no guarding or rebound.     Comments: Patient has no stool in the rectal vault  Musculoskeletal:     Cervical back: Normal range of motion and neck supple.  Skin:    General: Skin is warm and dry.  Neurological:  Mental Status: She is alert and oriented to person, place, and time.     ED Results / Procedures / Treatments   Labs (all labs ordered are listed, but only abnormal results are displayed) Labs Reviewed  BASIC METABOLIC PANEL - Abnormal; Notable for the following components:      Result Value   Sodium 133 (*)    Glucose, Bld 141 (*)    Creatinine, Ser 1.02 (*)    GFR, Estimated 59 (*)    All other components within normal limits  CBC WITH DIFFERENTIAL/PLATELET - Abnormal; Notable for the following components:   Platelets 415 (*)    All other components within normal limits  URINALYSIS, ROUTINE W REFLEX MICROSCOPIC - Abnormal; Notable for the following components:   Hgb urine dipstick SMALL (*)    Ketones, ur 5 (*)    Bacteria,  UA RARE (*)    All other components within normal limits    EKG None  Radiology No results found.  Procedures Procedures    Medications Ordered in ED Medications  sodium chloride 0.9 % bolus 500 mL (500 mLs Intravenous New Bag/Given 05/04/22 1200)  sodium phosphate (FLEET) 7-19 GM/118ML enema 1 enema (1 enema Rectal Given 05/04/22 1255)    ED Course/ Medical Decision Making/ A&P                             Medical Decision Making Amount and/or Complexity of Data Reviewed Labs: ordered.  Risk OTC drugs.   72 year old patient comes in with chief complaint of generalized abdominal pain, distention and no BM in 10 days.  She is however passing flatus and denies any nausea or vomiting.  Her abdominal exam is overall reassuring.  Past medical history positive for depression, thyroid disease and she is status post cholecystectomy.  Differential diagnosis considered for this patient includes severe constipation, fecal disimpaction, tumor.  I reviewed patient's chart.  She had a colonoscopy in 2009 which was negative for any acute findings.  Plan is to proceed with an MRI.  I do not think CT scan is indicated at this time if patient has appropriate stool output and feels better.  Labs ordered.   2:14 PM Patient was given enema.  She had large bowel movement.  Will prescribe her MiraLAX for 3 days. I advised her to follow-up with her PCP to see if she is due for colonoscopy. Final Clinical Impression(s) / ED Diagnoses Final diagnoses:  Constipation, unspecified constipation type    Rx / DC Orders ED Discharge Orders          Ordered    polyethylene glycol (MIRALAX / GLYCOLAX) 17 g packet  Daily        05/04/22 1413              Derwood Kaplan, MD 05/04/22 1414

## 2022-05-04 NOTE — ED Triage Notes (Signed)
C/o generalized abd pain with LBM:10 days ago.  Pt reports passing gas but feels pressure at rectum.  Stool softener at home w/o relief.

## 2022-05-04 NOTE — ED Notes (Signed)
Pt is sitting up on bedside commode.

## 2022-05-06 ENCOUNTER — Telehealth: Payer: Self-pay | Admitting: Emergency Medicine

## 2022-05-06 DIAGNOSIS — L819 Disorder of pigmentation, unspecified: Secondary | ICD-10-CM

## 2022-05-06 NOTE — Telephone Encounter (Signed)
New referral placed.

## 2022-05-06 NOTE — Telephone Encounter (Signed)
Patient called and said that she has a referral for dermatology, but is unable to get ahold of them. She said she has tried to contact them multiple times and hasn't been able to get through. She would like to know if the referral can be sent to a different dermatology office. Best callback is [phone].

## 2022-05-07 ENCOUNTER — Telehealth: Payer: Self-pay

## 2022-05-07 DIAGNOSIS — M47816 Spondylosis without myelopathy or radiculopathy, lumbar region: Secondary | ICD-10-CM | POA: Diagnosis not present

## 2022-05-07 DIAGNOSIS — G959 Disease of spinal cord, unspecified: Secondary | ICD-10-CM | POA: Diagnosis not present

## 2022-05-07 DIAGNOSIS — Z6832 Body mass index (BMI) 32.0-32.9, adult: Secondary | ICD-10-CM | POA: Diagnosis not present

## 2022-05-07 NOTE — Telephone Encounter (Signed)
     Patient  visit on 4/8  at [facility]   Have you been able to follow up with your primary care physician? No   The patient was or was not able to obtain any needed medicine or equipment. Yes   Are there diet recommendations that you are having difficulty following? Na   Patient expresses understanding of discharge instructions and education provided has no other needs at this time.  Yes      [facility] Guide, [facility] [phone] [address] [place], [place], [place] [address] Phone: [phone] Email: [email or web address]

## 2022-05-13 ENCOUNTER — Encounter: Payer: Self-pay | Admitting: Emergency Medicine

## 2022-05-13 ENCOUNTER — Ambulatory Visit (INDEPENDENT_AMBULATORY_CARE_PROVIDER_SITE_OTHER): Payer: Medicare HMO

## 2022-05-13 ENCOUNTER — Other Ambulatory Visit: Payer: Self-pay | Admitting: Neurosurgery

## 2022-05-13 ENCOUNTER — Ambulatory Visit (INDEPENDENT_AMBULATORY_CARE_PROVIDER_SITE_OTHER): Payer: Medicare HMO | Admitting: Emergency Medicine

## 2022-05-13 VITALS — BP 130/70 | HR 80 | Temp 98.1°F | Ht 69.0 in | Wt 214.1 lb

## 2022-05-13 DIAGNOSIS — G959 Disease of spinal cord, unspecified: Secondary | ICD-10-CM

## 2022-05-13 DIAGNOSIS — K5900 Constipation, unspecified: Secondary | ICD-10-CM

## 2022-05-13 DIAGNOSIS — R634 Abnormal weight loss: Secondary | ICD-10-CM | POA: Diagnosis not present

## 2022-05-13 DIAGNOSIS — E785 Hyperlipidemia, unspecified: Secondary | ICD-10-CM | POA: Diagnosis not present

## 2022-05-13 DIAGNOSIS — E1169 Type 2 diabetes mellitus with other specified complication: Secondary | ICD-10-CM

## 2022-05-13 DIAGNOSIS — F17200 Nicotine dependence, unspecified, uncomplicated: Secondary | ICD-10-CM

## 2022-05-13 DIAGNOSIS — R011 Cardiac murmur, unspecified: Secondary | ICD-10-CM | POA: Diagnosis not present

## 2022-05-13 DIAGNOSIS — I10 Essential (primary) hypertension: Secondary | ICD-10-CM | POA: Diagnosis not present

## 2022-05-13 LAB — COMPREHENSIVE METABOLIC PANEL
ALT: 12 U/L (ref 0–35)
AST: 14 U/L (ref 0–37)
Albumin: 4.6 g/dL (ref 3.5–5.2)
Alkaline Phosphatase: 98 U/L (ref 39–117)
BUN: 17 mg/dL (ref 6–23)
CO2: 25 mEq/L (ref 19–32)
Calcium: 10 mg/dL (ref 8.4–10.5)
Chloride: 99 mEq/L (ref 96–112)
Creatinine, Ser: 1.03 mg/dL (ref 0.40–1.20)
GFR: 55.27 mL/min — ABNORMAL LOW (ref >60.00)
Glucose, Bld: 104 mg/dL — ABNORMAL HIGH (ref 70–99)
Potassium: 4.1 mEq/L (ref 3.5–5.1)
Sodium: 135 mEq/L (ref 135–145)
Total Bilirubin: 0.6 mg/dL (ref 0.2–1.2)
Total Protein: 7.8 g/dL (ref 6.0–8.3)

## 2022-05-13 LAB — CBC WITH DIFFERENTIAL/PLATELET
Basophils Absolute: 0.1 10*3/uL (ref 0.0–0.1)
Basophils Relative: 0.5 % (ref 0.0–3.0)
Eosinophils Absolute: 0.1 10*3/uL (ref 0.0–0.7)
Eosinophils Relative: 0.5 % (ref 0.0–5.0)
HCT: 42.8 % (ref 36.0–46.0)
Hemoglobin: 14.3 g/dL (ref 12.0–15.0)
Lymphocytes Relative: 23.3 % (ref 12.0–46.0)
Lymphs Abs: 2.8 10*3/uL (ref 0.7–4.0)
MCHC: 33.4 g/dL (ref 30.0–36.0)
MCV: 87.6 fl (ref 78.0–100.0)
Monocytes Absolute: 1 10*3/uL (ref 0.1–1.0)
Monocytes Relative: 8.7 % (ref 3.0–12.0)
Neutro Abs: 8.1 10*3/uL — ABNORMAL HIGH (ref 1.4–7.7)
Neutrophils Relative %: 67 % (ref 43.0–77.0)
Platelets: 426 10*3/uL — ABNORMAL HIGH (ref 150.0–400.0)
RBC: 4.88 Mil/uL (ref 3.87–5.11)
RDW: 15 % (ref 11.5–15.5)
WBC: 12.1 10*3/uL — ABNORMAL HIGH (ref 4.0–10.5)

## 2022-05-13 LAB — HEMOGLOBIN A1C: Hgb A1c MFr Bld: 6.6 % — ABNORMAL HIGH (ref 4.6–6.5)

## 2022-05-13 NOTE — Assessment & Plan Note (Signed)
Advised to increase amount of daily fiber intake and stay well-hydrated Recommend to take milk of magnesia daily for 3 days

## 2022-05-13 NOTE — Assessment & Plan Note (Signed)
Concerning for malignancy Current smoker puts her at risk for several neoplastic processes including bladder and lung cancer Unremarkable chest x-ray Differential diagnosis discussed with patient Stress is a contributing factor Needs workup including CT scan of abdomen and pelvis Needs colonoscopy for colon cancer screening

## 2022-05-13 NOTE — Progress Notes (Signed)
Jasmine Long 72 y.o.   Chief Complaint  Patient presents with   Abdominal Pain    Patient has not had a bowel movement in over 6 days, constipated , patient was seen at Ed, given an enema helped a little but still constipated.     HISTORY OF PRESENT ILLNESS: This is a 72 y.o. female complaining of constipation for couple weeks Went to emergency department on 05/04/2022 for the same.  Unremarkable blood work.  CT scan not done Was given enema with large bowel movement.  Sent home to try MiraLAX MiraLAX and over-the-counter stool softeners not working very well Complaining of weight loss over the past several weeks.  Decreased appetite.  Stressed out. Chronic low back pain.  Not taking opiates. Wt Readings from Last 3 Encounters:  05/13/22 214 lb 2 oz (97.1 kg)  04/22/22 221 lb 6 oz (100.4 kg)  04/07/22 224 lb 2 oz (101.7 kg)     HPI   Prior to Admission medications   Medication Sig Start Date End Date Taking? Authorizing Provider  ALPRAZolam (XANAX) 0.5 MG tablet TAKE 1 TABLET BY MOUTH ONCE DAILY AS NEEDED FOR ANXIETY 03/20/22  Yes Burns, Bobette Mo, MD  atorvastatin (LIPITOR) 20 MG tablet Take 1 tablet (20 mg total) by mouth daily. 08/04/21  Yes [name], MD  buPROPion (WELLBUTRIN XL) 300 MG 24 hr tablet Take 1 tablet (300 mg total) by mouth daily. 02/15/21  Yes Nyah Shepherd, Eilleen Kempf, MD  clonazePAM (KLONOPIN) 0.5 MG tablet Take 0.5 mg by mouth 2 (two) times daily as needed for anxiety.   Yes [provider]  escitalopram (LEXAPRO) 10 MG tablet Take 1 tablet (10 mg total) by mouth daily. 12/25/21 12/20/22 Yes Bryse Blanchette, Eilleen Kempf, MD  levothyroxine (SYNTHROID) 125 MCG tablet TAKE 1 TABLET BY MOUTH ONCE DAILY BEFORE BREAKFAST 04/14/22  Yes [name], MD  metFORMIN (GLUCOPHAGE) 1000 MG tablet Take 1 tablet (1,000 mg total) by mouth 2 (two) times daily with a meal. 02/15/21  Yes Ulysees Robarts, Eilleen Kempf, MD  metoprolol succinate (TOPROL-XL) 25 MG 24 hr tablet Take  1 tablet (25 mg total) by mouth daily. 02/15/21  Yes Melesa Lecy, Eilleen Kempf, MD  nicotine (NICODERM CQ - DOSED IN MG/24 HOURS) 21 mg/24hr patch Place 1 patch (21 mg total) onto the skin daily. 04/20/18  Yes Neeti Knudtson, Eilleen Kempf, MD  polyethylene glycol (MIRALAX / GLYCOLAX) 17 g packet Take 17 g by mouth daily. 05/04/22  Yes Nanavati, Ankit, MD  valsartan-hydrochlorothiazide (DIOVAN-HCT) 160-12.5 MG tablet Take 1 tablet by mouth daily. 03/19/21  Yes [name], MD    No Known Allergies  Patient Active Problem List   Diagnosis Date Noted   Change in multiple pigmented skin lesions 04/22/2022   Paresthesia of both lower extremities 04/22/2022   Abnormal MRI, lumbar spine 04/22/2022   Vitreous floaters of right eye 04/07/2022   Hematoma of scalp 12/25/2021   Smokers' cough 12/26/2020   Chronic bilateral low back pain with left-sided sciatica 12/26/2020   Chronic sinus complaints 12/26/2020   Chronic anxiety 06/28/2019   History of peptic ulcer 04/30/2017   Hyperlipidemia 12/11/2015   Insomnia 12/11/2015   Panic attack as reaction to stress 12/11/2015   Current smoker 12/11/2015   Chronic sinusitis 12/11/2015   Dyslipidemia associated with type 2 diabetes mellitus 05/15/2015   Hypothyroid 04/29/2011   BMI 40.0-44.9, adult 04/29/2011   Depression with anxiety 04/29/2011   GERD (gastroesophageal reflux disease) 04/29/2011   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  Nicotine addiction 04/29/2011   Essential hypertension 04/29/2011   Murmur, cardiac 04/29/2011    Past Medical History:  Diagnosis Date   Allergy    Anxiety    Depression    Heart murmur    Hypertension    Thyroid disease    Ulcer     Past Surgical History:  Procedure Laterality Date   BREAST SURGERY     CESAREAN SECTION     CHOLECYSTECTOMY     NASAL SEPTUM SURGERY      Social History   Socioeconomic History   Marital status: Divorced    Spouse name: Not on file   Number of children: 2   Years of  education: Not on file   Highest education level: Not on file  Occupational History   Occupation: Unemployed  Tobacco Use   Smoking status: Every Day    Packs/day: 1.50    Years: 40.00    Additional pack years: 0.00    Total pack years: 60.00    Types: Cigarettes    Last attempt to quit: 01/20/2012    Years since quitting: 10.3   Smokeless tobacco: Never   Tobacco comments:    Pt doing E-cigarette as of 01/20/12  Vaping Use   Vaping Use: Former  Substance and Sexual Activity   Alcohol use: No   Drug use: No   Sexual activity: Never  Other Topics Concern   Not on file  Social History Narrative   Patient on worker's compensation for an injury sustained on the job 5 years ago.      Right handed and Left Handed       Lives in a two story home       One son deceased.    Social Determinants of Health   Financial Resource Strain: Low Risk  (05/05/2021)   Overall Financial Resource Strain (CARDIA)    Difficulty of Paying Living Expenses: Not hard at all  Food Insecurity: No Food Insecurity (05/05/2021)   Hunger Vital Sign    Worried About Running Out of Food in the Last Year: Never true    Ran Out of Food in the Last Year: Never true  Transportation Needs: No Transportation Needs (05/05/2021)   PRAPARE - Administrator, Civil Service (Medical): No    Lack of Transportation (Non-Medical): No  Physical Activity: Inactive (05/05/2021)   Exercise Vital Sign    Days of Exercise per Week: 0 days    Minutes of Exercise per Session: 0 min  Stress: No Stress Concern Present (05/05/2021)   [employer] of Occupational Health - Occupational Stress Questionnaire    Feeling of Stress : Not at all  Social Connections: Socially Isolated (05/05/2021)   Social Connection and Isolation Panel [NHANES]    Frequency of Communication with Friends and Family: Twice a week    Frequency of Social Gatherings with Friends and Family: Twice a week    Attends Religious Services: Never     Database administrator or Organizations: No    Attends Banker Meetings: Never    Marital Status: Divorced  Catering manager Violence: Not At Risk (05/05/2021)   Humiliation, Afraid, Rape, and Kick questionnaire    Fear of Current or Ex-Partner: No    Emotionally Abused: No    Physically Abused: No    Sexually Abused: No    Family History  Problem Relation Age of Onset   Lung cancer Mother    Dementia Father    Atrial  fibrillation Father      Review of Systems  Constitutional:  Positive for weight loss.  HENT: Negative.  Negative for congestion and sore throat.   Respiratory: Negative.  Negative for cough and shortness of breath.   Cardiovascular: Negative.  Negative for chest pain and palpitations.  Gastrointestinal:  Positive for constipation. Negative for abdominal pain, blood in stool, melena, nausea and vomiting.  Genitourinary: Negative.  Negative for dysuria and hematuria.  Skin: Negative.  Negative for rash.  Neurological: Negative.  Negative for dizziness and headaches.  All other systems reviewed and are negative.   Vitals:   05/13/22 1427  BP: (!) 150/84  Pulse: (!) 41  Temp: 98.1 F (36.7 C)  SpO2: 97%    Physical Exam Vitals reviewed.  Constitutional:      Appearance: She is well-developed.  HENT:     Head: Normocephalic.     Mouth/Throat:     Mouth: Mucous membranes are moist.     Pharynx: Oropharynx is clear.  Eyes:     Extraocular Movements: Extraocular movements intact.     Conjunctiva/sclera: Conjunctivae normal.     Pupils: Pupils are equal, round, and reactive to light.  Cardiovascular:     Rate and Rhythm: Normal rate and regular rhythm.     Pulses: Normal pulses.     Heart sounds: Normal heart sounds.  Pulmonary:     Effort: Pulmonary effort is normal.     Breath sounds: Normal breath sounds.  Abdominal:     General: There is no distension.     Palpations: Abdomen is soft.     Tenderness: There is no abdominal  tenderness.  Musculoskeletal:     Cervical back: No tenderness.  Lymphadenopathy:     Cervical: No cervical adenopathy.  Skin:    General: Skin is warm and dry.     Capillary Refill: Capillary refill takes less than 2 seconds.  Neurological:     General: No focal deficit present.     Mental Status: She is alert and oriented to person, place, and time.  Psychiatric:        Mood and Affect: Mood normal.        Behavior: Behavior normal.      ASSESSMENT & PLAN: A total of 44 minutes was spent with the patient and counseling/coordination of care regarding preparing for this visit, review of most recent office visit notes, review of multiple chronic medical conditions under management, review of all medications, differential diagnosis of weight loss and need for workup to rule out malignancy, treatment of constipation, education on nutrition, smoking cessation advice, prognosis, documentation and need for follow-up.  Problem List Items Addressed This Visit       Cardiovascular and Mediastinum   Essential hypertension    BP Readings from Last 3 Encounters:  05/13/22 130/70  05/04/22 137/80  04/22/22 134/82  Well-controlled hypertension Continue metoprolol succinate 25 mg and Diovan HCT 160-12.5 mg daily         Endocrine   Dyslipidemia associated with type 2 diabetes mellitus    Well-controlled diabetes Lab Results  Component Value Date   HGBA1C 6.1 (A) 09/08/2021  Continue metformin 1000 mg twice a day Continue atorvastatin 20 mg daily Diet and nutrition discussed         Other   Current smoker   Weight loss - Primary    Concerning for malignancy Current smoker puts her at risk for several neoplastic processes including bladder and lung cancer Unremarkable chest x-ray Differential  diagnosis discussed with patient Stress is a contributing factor Needs workup including CT scan of abdomen and pelvis Needs colonoscopy for colon cancer screening      Relevant Orders    DG Chest 2 View (Completed)   Comprehensive metabolic panel   Hemoglobin A1c   CBC with Differential/Platelet   Urinalysis   CT Abdomen Pelvis W Contrast   Ambulatory referral to Gastroenterology   Constipation    Advised to increase amount of daily fiber intake and stay well-hydrated Recommend to take milk of magnesia daily for 3 days      Patient Instructions  Constipation, Adult Constipation is when a person has trouble pooping (having a bowel movement). When you have this condition, you may poop fewer than 3 times a week. Your poop (stool) may also be dry, hard, or bigger than normal. Follow these instructions at home: Eating and drinking  Eat foods that have a lot of fiber, such as: Fresh fruits and vegetables. Whole grains. Beans. Eat less of foods that are low in fiber and high in fat and sugar, such as: [place] fries. Hamburgers. Cookies. Candy. Soda. Drink enough fluid to keep your pee (urine) pale yellow. General instructions Exercise regularly or as told by your doctor. Try to do 150 minutes of exercise each week. Go to the restroom when you feel like you need to poop. Do not hold it in. Take over-the-counter and prescription medicines only as told by your doctor. These include any fiber supplements. When you poop: Do deep breathing while relaxing your lower belly (abdomen). Relax your pelvic floor. The pelvic floor is a group of muscles that support the rectum, bladder, and intestines (as well as the uterus in women). Watch your condition for any changes. Tell your doctor if you notice any. Keep all follow-up visits as told by your doctor. This is important. Contact a doctor if: You have pain that gets worse. You have a fever. You have not pooped for 4 days. You vomit. You are not hungry. You lose weight. You are bleeding from the opening of the butt (anus). You have thin, pencil-like poop. Get help right away if: You have a fever, and your symptoms  suddenly get worse. You leak poop or have blood in your poop. Your belly feels hard or bigger than normal (bloated). You have very bad belly pain. You feel dizzy or you faint. Summary Constipation is when a person poops fewer than 3 times a week, has trouble pooping, or has poop that is dry, hard, or bigger than normal. Eat foods that have a lot of fiber. Drink enough fluid to keep your pee (urine) pale yellow. Take over-the-counter and prescription medicines only as told by your doctor. These include any fiber supplements. This information is not intended to replace advice given to you by your health care provider. Make sure you discuss any questions you have with your health care provider. Document Revised: 11/26/2021 Document Reviewed: 11/26/2021 Elsevier Patient Education  2023 Elsevier Inc.      Edwina Barth, MD [facility]

## 2022-05-13 NOTE — Assessment & Plan Note (Signed)
BP Readings from Last 3 Encounters:  05/13/22 130/70  05/04/22 137/80  04/22/22 134/82  Well-controlled hypertension Continue metoprolol succinate 25 mg and Diovan HCT 160-12.5 mg daily

## 2022-05-13 NOTE — Assessment & Plan Note (Signed)
Well-controlled diabetes Lab Results  Component Value Date   HGBA1C 6.1 (A) 09/08/2021  Continue metformin 1000 mg twice a day Continue atorvastatin 20 mg daily Diet and nutrition discussed

## 2022-05-13 NOTE — Patient Instructions (Signed)

## 2022-05-14 ENCOUNTER — Telehealth: Payer: Self-pay

## 2022-05-14 LAB — URINALYSIS
Bilirubin Urine: NEGATIVE
Hgb urine dipstick: NEGATIVE
Ketones, ur: NEGATIVE
Leukocytes,Ua: NEGATIVE
Nitrite: NEGATIVE
Specific Gravity, Urine: 1.02 (ref 1.000–1.030)
Total Protein, Urine: NEGATIVE
Urine Glucose: NEGATIVE
Urobilinogen, UA: 1 (ref 0.0–1.0)
pH: 6 (ref 5.0–8.0)

## 2022-05-14 NOTE — Telephone Encounter (Signed)
Call patient please.  Unremarkable labs with acceptable values.  Normal chest x-ray.  Make sure patient schedules CT scan of abdomen and pelvis as discussed and follows up with GI for colonoscopy as discussed also.  Thanks.

## 2022-05-14 NOTE — Telephone Encounter (Signed)
T has called asking for her chest x-ray results. I was able to inform her the x-ray has not been resulted by her provider as of yet.  Pt also wanted Dr. Alvy Bimler to know she has sched her MRI for the 1st week in May and it is with Contrast.

## 2022-05-15 ENCOUNTER — Encounter: Payer: Self-pay | Admitting: Nurse Practitioner

## 2022-05-15 NOTE — Telephone Encounter (Signed)
Pt has stated she already has her CT set up for May 7th with contrast. She also wanted Dr. Alvy Bimler to know "the pink liquid is working and he is the Public relations account executive on earth."

## 2022-05-15 NOTE — Telephone Encounter (Signed)
Called pt verified if she received lab results. She stated yes, and CT has been scheduled, but waiting on gastro for colonoscopy. Look referral up inform her per recored they documented called pt unable to reach, patients phone rang and then disconnected . Gave her Laurette Schimke # to call./lmb

## 2022-05-17 NOTE — Telephone Encounter (Signed)
Thank you Lucy.

## 2022-05-18 ENCOUNTER — Telehealth: Payer: Self-pay | Admitting: Emergency Medicine

## 2022-05-18 DIAGNOSIS — K5900 Constipation, unspecified: Secondary | ICD-10-CM

## 2022-05-18 DIAGNOSIS — R634 Abnormal weight loss: Secondary | ICD-10-CM

## 2022-05-18 NOTE — Telephone Encounter (Signed)
Patient was here on the 17th of April.  She said that Dr. Alvy Bimler wanted her to let him know about any changes in her bowel movements.  Patient states that they are small and hard to come out.  About the size of a bubblegum cigar - still black and tarry looking - no blood.  Patient would like to be seen sooner by the gastroenterologist (June) if at all possible.  Patient is eating about 2-3 banana's  a day.

## 2022-05-18 NOTE — Telephone Encounter (Signed)
Lets make sure we get the CT scan of abdomen and pelvis first.  Also try referring to Dr. Jeani Hawking, GI doctor may be able to see her sooner if coming from me.  Thanks.

## 2022-05-19 NOTE — Telephone Encounter (Signed)
Called patient and informed patient that a referral was placed to Dr. Elnoria Howard office

## 2022-05-21 ENCOUNTER — Other Ambulatory Visit: Payer: Self-pay | Admitting: Internal Medicine

## 2022-05-21 DIAGNOSIS — F4323 Adjustment disorder with mixed anxiety and depressed mood: Secondary | ICD-10-CM

## 2022-05-21 DIAGNOSIS — F418 Other specified anxiety disorders: Secondary | ICD-10-CM

## 2022-05-25 ENCOUNTER — Telehealth: Payer: Self-pay | Admitting: Emergency Medicine

## 2022-05-25 DIAGNOSIS — K5900 Constipation, unspecified: Secondary | ICD-10-CM | POA: Diagnosis not present

## 2022-05-25 DIAGNOSIS — E119 Type 2 diabetes mellitus without complications: Secondary | ICD-10-CM | POA: Diagnosis not present

## 2022-05-25 DIAGNOSIS — Z1211 Encounter for screening for malignant neoplasm of colon: Secondary | ICD-10-CM | POA: Diagnosis not present

## 2022-05-25 NOTE — Telephone Encounter (Signed)
Pt called wanting to let Dr. Alvy Bimler  know that she is scheduled for a colonoscopy with Dr. Elnoria Howard May the 7th at 12:45 and she also st Dr. Elnoria Howard is really sweet like Dr Tollie Eth:-)

## 2022-05-27 ENCOUNTER — Other Ambulatory Visit: Payer: Self-pay | Admitting: Emergency Medicine

## 2022-05-27 ENCOUNTER — Telehealth: Payer: Self-pay | Admitting: Emergency Medicine

## 2022-05-27 DIAGNOSIS — F4323 Adjustment disorder with mixed anxiety and depressed mood: Secondary | ICD-10-CM

## 2022-05-27 DIAGNOSIS — F418 Other specified anxiety disorders: Secondary | ICD-10-CM

## 2022-05-27 DIAGNOSIS — I10 Essential (primary) hypertension: Secondary | ICD-10-CM

## 2022-05-27 MED ORDER — ALPRAZOLAM 0.5 MG PO TABS: 0.5000 mg | ORAL_TABLET | Freq: Every day | ORAL | 1 refills | Status: DC | PRN

## 2022-05-27 NOTE — Telephone Encounter (Signed)
New prescription sent to pharmacy of record today.  Thanks.

## 2022-05-27 NOTE — Telephone Encounter (Signed)
Prescription Request  05/27/2022  LOV: 05/13/2022  What is the name of the medication or equipment? ALPRAZolam (XANAX) 0.5 MG tablet [   Have you contacted your pharmacy to request a refill? No   Which pharmacy would you like this sent to?  [facility] [place], [place] - [address] [place] [address] Phone: [phone] Fax: [phone]    Patient notified that their request is being sent to the clinical staff for review and that they should receive a response within 2 business days.   Please advise at Mobile [phone] (mobile)

## 2022-05-29 ENCOUNTER — Inpatient Hospital Stay: Admission: RE | Admit: 2022-05-29 | Payer: Medicare HMO | Source: Ambulatory Visit

## 2022-06-01 DIAGNOSIS — M4802 Spinal stenosis, cervical region: Secondary | ICD-10-CM | POA: Diagnosis not present

## 2022-06-01 DIAGNOSIS — M4712 Other spondylosis with myelopathy, cervical region: Secondary | ICD-10-CM | POA: Diagnosis not present

## 2022-06-01 DIAGNOSIS — M2578 Osteophyte, vertebrae: Secondary | ICD-10-CM | POA: Diagnosis not present

## 2022-06-02 ENCOUNTER — Telehealth: Payer: Self-pay

## 2022-06-02 DIAGNOSIS — Z1211 Encounter for screening for malignant neoplasm of colon: Secondary | ICD-10-CM | POA: Diagnosis not present

## 2022-06-02 DIAGNOSIS — K5730 Diverticulosis of large intestine without perforation or abscess without bleeding: Secondary | ICD-10-CM | POA: Diagnosis not present

## 2022-06-02 DIAGNOSIS — K635 Polyp of colon: Secondary | ICD-10-CM | POA: Diagnosis not present

## 2022-06-02 DIAGNOSIS — D124 Benign neoplasm of descending colon: Secondary | ICD-10-CM | POA: Diagnosis not present

## 2022-06-02 DIAGNOSIS — D123 Benign neoplasm of transverse colon: Secondary | ICD-10-CM | POA: Diagnosis not present

## 2022-06-02 LAB — HM COLONOSCOPY

## 2022-06-02 NOTE — Progress Notes (Signed)
06/02/2022  Patient ID: Jasmine Long, female   DOB: [birth year], 72 y.o.   MRN: [id number]  Outreach attempt to review medication list and discuss medication adherence.  Did not reach patient but left voicemail with my direct number to call at her convenience.  Lenna Gilford, PharmD, DPLA

## 2022-06-04 DIAGNOSIS — G959 Disease of spinal cord, unspecified: Secondary | ICD-10-CM | POA: Diagnosis not present

## 2022-06-06 ENCOUNTER — Other Ambulatory Visit: Payer: Medicare HMO

## 2022-06-16 DIAGNOSIS — M5134 Other intervertebral disc degeneration, thoracic region: Secondary | ICD-10-CM | POA: Diagnosis not present

## 2022-06-18 DIAGNOSIS — G959 Disease of spinal cord, unspecified: Secondary | ICD-10-CM | POA: Diagnosis not present

## 2022-07-06 ENCOUNTER — Telehealth: Payer: Self-pay | Admitting: Emergency Medicine

## 2022-07-06 NOTE — Telephone Encounter (Signed)
Patient was referred to Dr. Jordan Likes by Dr. Alvy Bimler and then Dr. Jordan Likes referred patient to Neurology. Where she was referred cannot see her for a while and the patient would like to know if Dr. Alvy Bimler can refer her somewhere else. Best callback is [phone].

## 2022-07-06 NOTE — Telephone Encounter (Signed)
Patient will call Dr. Dutch Quint office and get them to resend the referral to another neurology office.

## 2022-07-15 ENCOUNTER — Ambulatory Visit: Payer: Medicare HMO | Admitting: Nurse Practitioner

## 2022-07-16 ENCOUNTER — Ambulatory Visit (INDEPENDENT_AMBULATORY_CARE_PROVIDER_SITE_OTHER): Payer: Medicare HMO

## 2022-07-16 ENCOUNTER — Ambulatory Visit (INDEPENDENT_AMBULATORY_CARE_PROVIDER_SITE_OTHER): Payer: Medicare HMO | Admitting: Emergency Medicine

## 2022-07-16 ENCOUNTER — Encounter: Payer: Self-pay | Admitting: Emergency Medicine

## 2022-07-16 VITALS — BP 130/78 | HR 92 | Temp 98.1°F | Ht 69.0 in | Wt 186.2 lb

## 2022-07-16 DIAGNOSIS — F419 Anxiety disorder, unspecified: Secondary | ICD-10-CM

## 2022-07-16 DIAGNOSIS — E1169 Type 2 diabetes mellitus with other specified complication: Secondary | ICD-10-CM

## 2022-07-16 DIAGNOSIS — Z7985 Long-term (current) use of injectable non-insulin antidiabetic drugs: Secondary | ICD-10-CM

## 2022-07-16 DIAGNOSIS — J410 Simple chronic bronchitis: Secondary | ICD-10-CM | POA: Diagnosis not present

## 2022-07-16 DIAGNOSIS — I10 Essential (primary) hypertension: Secondary | ICD-10-CM

## 2022-07-16 DIAGNOSIS — M5442 Lumbago with sciatica, left side: Secondary | ICD-10-CM | POA: Diagnosis not present

## 2022-07-16 DIAGNOSIS — R8290 Unspecified abnormal findings in urine: Secondary | ICD-10-CM | POA: Insufficient documentation

## 2022-07-16 DIAGNOSIS — F418 Other specified anxiety disorders: Secondary | ICD-10-CM | POA: Diagnosis not present

## 2022-07-16 DIAGNOSIS — S20221A Contusion of right back wall of thorax, initial encounter: Secondary | ICD-10-CM

## 2022-07-16 DIAGNOSIS — E785 Hyperlipidemia, unspecified: Secondary | ICD-10-CM

## 2022-07-16 DIAGNOSIS — M545 Low back pain, unspecified: Secondary | ICD-10-CM | POA: Diagnosis not present

## 2022-07-16 DIAGNOSIS — G8929 Other chronic pain: Secondary | ICD-10-CM

## 2022-07-16 DIAGNOSIS — R634 Abnormal weight loss: Secondary | ICD-10-CM | POA: Diagnosis not present

## 2022-07-16 DIAGNOSIS — M546 Pain in thoracic spine: Secondary | ICD-10-CM | POA: Diagnosis not present

## 2022-07-16 DIAGNOSIS — F17200 Nicotine dependence, unspecified, uncomplicated: Secondary | ICD-10-CM

## 2022-07-16 LAB — URINALYSIS
Bilirubin Urine: NEGATIVE
Hgb urine dipstick: NEGATIVE
Ketones, ur: NEGATIVE
Leukocytes,Ua: NEGATIVE
Nitrite: NEGATIVE
Specific Gravity, Urine: 1.02 (ref 1.000–1.030)
Total Protein, Urine: NEGATIVE
Urine Glucose: NEGATIVE
Urobilinogen, UA: 1 (ref 0.0–1.0)
pH: 6.5 (ref 5.0–8.0)

## 2022-07-16 MED ORDER — BUSPIRONE HCL 7.5 MG PO TABS: 7.5000 mg | ORAL_TABLET | Freq: Two times a day (BID) | ORAL | 1 refills | Status: DC

## 2022-07-16 NOTE — Assessment & Plan Note (Addendum)
Still very active.  Anxiety primary component Affecting quality of life Continues Lexapro 10 mg daily and alprazolam as needed Recommend to start BuSpar 7.5 mg twice a day Contributing to weight loss issues Has seen psychiatrist in the past.  Does not want to go in that direction at this time.

## 2022-07-16 NOTE — Assessment & Plan Note (Signed)
Recent spine MRIs unremarkable Recently evaluated by orthopedist Was referred to neurologist for bilateral leg numbness

## 2022-07-16 NOTE — Assessment & Plan Note (Signed)
Stable condition Lab Results  Component Value Date   HGBA1C 6.6 (H) 05/13/2022  Continue metformin 1000 mg twice a day and atorvastatin 20 mg daily

## 2022-07-16 NOTE — Assessment & Plan Note (Signed)
BP Readings from Last 3 Encounters:  07/16/22 130/78  05/13/22 130/70  05/04/22 137/80  Well-controlled hypertension Continue metoprolol succinate 25 mg daily and valsartan HCT 160-12.5 mg daily

## 2022-07-16 NOTE — Assessment & Plan Note (Signed)
Urinalysis and urine culture sent today May have UTI.

## 2022-07-16 NOTE — Assessment & Plan Note (Signed)
Continues Lexapro 10 mg daily and alprazolam as needed Recommend to start BuSpar 7.5 mg twice a day May need psychiatry referral

## 2022-07-16 NOTE — Patient Instructions (Signed)
Managing Anxiety, Adult After being diagnosed with anxiety, you may be relieved to know why you have felt or behaved a certain way. You may also feel overwhelmed about the treatment ahead and what it will mean for your life. With care and support, you can manage your anxiety. How to manage lifestyle changes Understanding the difference between stress and anxiety Although stress can play a role in anxiety, it is not the same as anxiety. Stress is your body's reaction to life changes and events, both good and bad. Stress is often caused by something external, such as a deadline, test, or competition. It normally goes away after the event has ended and will last just a few hours. But, stress can be ongoing and can lead to more than just stress. Anxiety is caused by something internal, such as imagining a terrible outcome or worrying that something will go wrong that will greatly upset you. Anxiety often does not go away even after the event is over, and it can become a long-term (chronic) worry. Lowering stress and anxiety Talk with your health care provider or a counselor to learn more about lowering anxiety and stress. They may suggest tension-reduction techniques, such as: Music. Spend time creating or listening to music that you enjoy and that inspires you. Mindfulness-based meditation. Practice being aware of your normal breaths while not trying to control your breathing. It can be done while sitting or walking. Centering prayer. Focus on a word, phrase, or sacred image that means something to you and brings you peace. Deep breathing. Expand your stomach and inhale slowly through your nose. Hold your breath for 3-5 seconds. Then breathe out slowly, letting your stomach muscles relax. Self-talk. Learn to notice and spot thought patterns that lead to anxiety reactions. Change those patterns to thoughts that feel peaceful. Muscle relaxation. Take time to tense muscles and then relax them. Choose a  tension-reduction technique that fits your lifestyle and personality. These techniques take time and practice. Set aside 5-15 minutes a day to do them. Specialized therapists can offer counseling and training in these techniques. The training to help with anxiety may be covered by some insurance plans. Other things you can do to manage stress and anxiety include: Keeping a stress diary. This can help you learn what triggers your reaction and then learn ways to manage your response. Thinking about how you react to certain situations. You may not be able to control everything, but you can control your response. Making time for activities that help you relax and not feeling guilty about spending your time in this way. Doing visual imagery. This involves imagining or creating mental pictures to help you relax. Practicing yoga. Through yoga poses, you can lower tension and relax.  Medicines Medicines for anxiety include: Antidepressant medicines. These are usually prescribed for long-term daily control. Anti-anxiety medicines. These may be added in severe cases, especially when panic attacks occur. When used together, medicines, psychotherapy, and tension-reduction techniques may be the most effective treatment. Relationships Relationships can play a big part in helping you recover. Spend more time connecting with trusted friends and family members. Think about going to couples counseling if you have a partner, taking family education classes, or going to family therapy. Therapy can help you and others better understand your anxiety. How to recognize changes in your anxiety Everyone responds differently to treatment for anxiety. Recovery from anxiety happens when symptoms lessen and stop interfering with your daily life at home or work. This may mean that you   will start to: Have better concentration and focus. Worry will interfere less in your daily thinking. Sleep better. Be less irritable. Have more  energy. Have improved memory. Try to recognize when your condition is getting worse. Contact your provider if your symptoms interfere with home or work and you feel like your condition is not improving. Follow these instructions at home: Activity Exercise. Adults should: Exercise for at least 150 minutes each week. The exercise should increase your heart rate and make you sweat (moderate-intensity exercise). Do strengthening exercises at least twice a week. Get the right amount and quality of sleep. Most adults need 7-9 hours of sleep each night. Lifestyle  Eat a healthy diet that includes plenty of vegetables, fruits, whole grains, low-fat dairy products, and lean protein. Do not eat a lot of foods that are high in fats, added sugars, or salt (sodium). Make choices that simplify your life. Do not use any products that contain nicotine or tobacco. These products include cigarettes, chewing tobacco, and vaping devices, such as e-cigarettes. If you need help quitting, ask your provider. Avoid caffeine, alcohol, and certain over-the-counter cold medicines. These may make you feel worse. Ask your pharmacist which medicines to avoid. General instructions Take over-the-counter and prescription medicines only as told by your provider. Keep all follow-up visits. This is to make sure you are managing your anxiety well or if you need more support. Where to find support You can get help and support from: Self-help groups. Online and community organizations. A trusted spiritual leader. Couples counseling. Family education classes. Family therapy. Where to find more information You may find that joining a support group helps you deal with your anxiety. The following sources can help you find counselors or support groups near you: Mental Health [place]: mentalhealthamerica.net Anxiety and Depression Association of [place] (ADAA): adaa.org National Alliance on Mental Illness (NAMI): nami.org Contact  a health care provider if: You have a hard time staying focused or finishing tasks. You spend many hours a day feeling worried about everyday life. You are very tired because you cannot stop worrying. You start to have headaches or often feel tense. You have chronic nausea or diarrhea. Get help right away if: Your heart feels like it is racing. You have shortness of breath. You have thoughts of hurting yourself or others. Get help right away if you feel like you may hurt yourself or others, or have thoughts about taking your own life. Go to your nearest emergency room or: Call 911. Call the National Suicide Prevention Lifeline at 1-800-273-8255 or 988. This is open 24 hours a day. Text the Crisis Text Line at 741741. This information is not intended to replace advice given to you by your health care provider. Make sure you discuss any questions you have with your health care provider. Document Revised: 10/21/2021 Document Reviewed: 05/05/2020 [employer] Patient Education  2024 [employer]  

## 2022-07-16 NOTE — Assessment & Plan Note (Signed)
Continues to take place Loss of appetite Increased emotional stress in her life Recent workup so far negative for malignancy CT scan of abdomen and pelvis scheduled for tomorrow

## 2022-07-16 NOTE — Progress Notes (Signed)
Wt Readings from Last 3 Encounters:  07/16/22 186 lb 4 oz (84.5 kg)  05/13/22 214 lb 2 oz (97.1 kg)  04/22/22 221 lb 6 oz (100.4 kg)   Jasmine Long 72 y.o.   Chief Complaint  Patient presents with   Fall    Patient fell on Tuesday, patient states her feet are numb, patient states her buttock and back are hurting, painful     HISTORY OF PRESENT ILLNESS: This is a 72 y.o. female legs gave out and fell 2 days ago.  Injured back. Last office visit 05/13/2022.  Diagnosis: Unintentional weight loss Chronic smoker.  Negative chest x-ray Since last office visit hide colonoscopy with multiple polyps.  Noncancerous. Also went to see orthopedics.  Had MRIs of spine.  Unremarkable results.  Was referred to neurologist.  Complaining of chronic numbness to both legs. Extremely stressed out.  Loss of appetite.  Anxiety interfering with quality of life Unremarkable blood work Cloudy urine today  HPI   Prior to Admission medications   Medication Sig Start Date End Date Taking? Authorizing Provider  ALPRAZolam Prudy Feeler) 0.5 MG tablet Take 1 tablet (0.5 mg total) by mouth daily as needed for anxiety. 05/27/22  Yes Corderro Koloski, Eilleen Kempf, MD  atorvastatin (LIPITOR) 20 MG tablet Take 1 tablet (20 mg total) by mouth daily. 08/04/21  Yes [name], MD  buPROPion (WELLBUTRIN XL) 300 MG 24 hr tablet Take 1 tablet by mouth once daily 05/27/22  Yes Watt Geiler, Eilleen Kempf, MD  escitalopram (LEXAPRO) 10 MG tablet Take 1 tablet (10 mg total) by mouth daily. 12/25/21 12/20/22 Yes Dante Roudebush, Eilleen Kempf, MD  levothyroxine (SYNTHROID) 125 MCG tablet TAKE 1 TABLET BY MOUTH ONCE DAILY BEFORE BREAKFAST 04/14/22  Yes [name], MD  metFORMIN (GLUCOPHAGE) 1000 MG tablet Take 1 tablet (1,000 mg total) by mouth 2 (two) times daily with a meal. 02/15/21  Yes Joshua Soulier, Eilleen Kempf, MD  metoprolol succinate (TOPROL-XL) 25 MG 24 hr tablet Take 1 tablet by mouth once daily 05/27/22  Yes Ihsan Nomura, Eilleen Kempf, MD   nicotine (NICODERM CQ - DOSED IN MG/24 HOURS) 21 mg/24hr patch Place 1 patch (21 mg total) onto the skin daily. 04/20/18  Yes Sabena Winner, Eilleen Kempf, MD  polyethylene glycol (MIRALAX / GLYCOLAX) 17 g packet Take 17 g by mouth daily. 05/04/22  Yes Derwood Kaplan, MD  valsartan-hydrochlorothiazide (DIOVAN-HCT) 160-12.5 MG tablet Take 1 tablet by mouth once daily 05/27/22  Yes Aileana Hodder, Eilleen Kempf, MD    No Known Allergies  Patient Active Problem List   Diagnosis Date Noted   Weight loss 05/13/2022   Constipation 05/13/2022   Change in multiple pigmented skin lesions 04/22/2022   Paresthesia of both lower extremities 04/22/2022   Abnormal MRI, lumbar spine 04/22/2022   Vitreous floaters of right eye 04/07/2022   Hematoma of scalp 12/25/2021   Smokers' cough (HCC) 12/26/2020   Chronic bilateral low back pain with left-sided sciatica 12/26/2020   Chronic sinus complaints 12/26/2020   Chronic anxiety 06/28/2019   History of peptic ulcer 04/30/2017   Hyperlipidemia 12/11/2015   Insomnia 12/11/2015   Panic attack as reaction to stress 12/11/2015   Current smoker 12/11/2015   Chronic sinusitis 12/11/2015   Dyslipidemia associated with type 2 diabetes mellitus (HCC) 05/15/2015   Hypothyroid 04/29/2011   BMI 40.0-44.9, adult (HCC) 04/29/2011   Depression with anxiety 04/29/2011   GERD (gastroesophageal reflux disease) 04/29/2011   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   Nicotine addiction 04/29/2011   Essential hypertension 04/29/2011  Murmur, cardiac 04/29/2011    Past Medical History:  Diagnosis Date   Allergy    Anxiety    Depression    Heart murmur    Hypertension    Thyroid disease    Ulcer     Past Surgical History:  Procedure Laterality Date   BREAST SURGERY     CESAREAN SECTION     CHOLECYSTECTOMY     NASAL SEPTUM SURGERY      Social History   Socioeconomic History   Marital status: Divorced    Spouse name: Not on file   Number of children: 2   Years of education:  Not on file   Highest education level: Not on file  Occupational History   Occupation: Unemployed  Tobacco Use   Smoking status: Every Day    Packs/day: 1.50    Years: 40.00    Additional pack years: 0.00    Total pack years: 60.00    Types: Cigarettes    Last attempt to quit: 01/20/2012    Years since quitting: 10.4   Smokeless tobacco: Never   Tobacco comments:    Pt doing E-cigarette as of 01/20/12  Vaping Use   Vaping Use: Former  Substance and Sexual Activity   Alcohol use: No   Drug use: No   Sexual activity: Never  Other Topics Concern   Not on file  Social History Narrative   Patient on worker's compensation for an injury sustained on the job 5 years ago.      Right handed and Left Handed       Lives in a two story home       One son deceased.    Social Determinants of Health   Financial Resource Strain: Low Risk  (05/05/2021)   Overall Financial Resource Strain (CARDIA)    Difficulty of Paying Living Expenses: Not hard at all  Food Insecurity: No Food Insecurity (05/05/2021)   Hunger Vital Sign    Worried About Running Out of Food in the Last Year: Never true    Ran Out of Food in the Last Year: Never true  Transportation Needs: No Transportation Needs (05/05/2021)   PRAPARE - Administrator, Civil Service (Medical): No    Lack of Transportation (Non-Medical): No  Physical Activity: Inactive (05/05/2021)   Exercise Vital Sign    Days of Exercise per Week: 0 days    Minutes of Exercise per Session: 0 min  Stress: No Stress Concern Present (05/05/2021)   [employer] of Occupational Health - Occupational Stress Questionnaire    Feeling of Stress : Not at all  Social Connections: Socially Isolated (05/05/2021)   Social Connection and Isolation Panel [NHANES]    Frequency of Communication with Friends and Family: Twice a week    Frequency of Social Gatherings with Friends and Family: Twice a week    Attends Religious Services: Never    Automotive engineer or Organizations: No    Attends Banker Meetings: Never    Marital Status: Divorced  Catering manager Violence: Not At Risk (05/05/2021)   Humiliation, Afraid, Rape, and Kick questionnaire    Fear of Current or Ex-Partner: No    Emotionally Abused: No    Physically Abused: No    Sexually Abused: No    Family History  Problem Relation Age of Onset   Lung cancer Mother    Dementia Father    Atrial fibrillation Father      Review of Systems  Constitutional: Negative.  Negative for chills and fever.  HENT: Negative.  Negative for congestion and sore throat.   Respiratory: Negative.  Negative for cough and shortness of breath.   Cardiovascular: Negative.  Negative for chest pain and palpitations.  Gastrointestinal:  Positive for abdominal pain. Negative for diarrhea, heartburn, nausea and vomiting.  Genitourinary: Negative.  Negative for dysuria.       Cloudy urine  Musculoskeletal: Negative.   Skin: Negative.  Negative for rash.  Neurological: Negative.  Negative for dizziness and headaches.  All other systems reviewed and are negative.   Vitals:   07/16/22 1011  BP: 130/78  Pulse: 92  Temp: 98.1 F (36.7 C)  SpO2: 94%    Physical Exam Vitals reviewed.  Constitutional:      Appearance: Normal appearance.  HENT:     Head: Normocephalic.  Eyes:     Extraocular Movements: Extraocular movements intact.     Pupils: Pupils are equal, round, and reactive to light.  Cardiovascular:     Rate and Rhythm: Normal rate and regular rhythm.     Pulses: Normal pulses.     Heart sounds: Normal heart sounds.  Pulmonary:     Effort: Pulmonary effort is normal.     Breath sounds: Normal breath sounds.  Abdominal:     Palpations: Abdomen is soft.     Tenderness: There is no abdominal tenderness.  Musculoskeletal:     Cervical back: No tenderness.     Comments: Back: No ecchymosis.  Mild tenderness along right side of her back  Skin:    General: Skin  is warm and dry.  Neurological:     Mental Status: She is alert and oriented to person, place, and time.  Psychiatric:        Mood and Affect: Mood normal.        Behavior: Behavior normal.    Results for orders placed or performed in visit on 07/16/22 (from the past 24 hour(s))  Urinalysis     Status: None   Collection Time: 07/16/22 11:10 AM  Result Value Ref Range   Color, Urine YELLOW Yellow;Lt. Yellow;Straw;Dark Yellow;Amber;Green;Red;Brown   APPearance CLEAR Clear;Turbid;Slightly Cloudy;Cloudy   Specific Gravity, Urine 1.020 1.000 - 1.030   pH 6.5 5.0 - 8.0   Total Protein, Urine NEGATIVE Negative   Urine Glucose NEGATIVE Negative   Ketones, ur NEGATIVE Negative   Bilirubin Urine NEGATIVE Negative   Hgb urine dipstick NEGATIVE Negative   Urobilinogen, UA 1.0 0.0 - 1.0   Leukocytes,Ua NEGATIVE Negative   Nitrite NEGATIVE Negative   DG Thoracic Spine 2 View  Result Date: 07/16/2022 CLINICAL DATA:  Upper/mid and low back pain for 2 days after a fall. EXAM: THORACIC SPINE 2 VIEWS; LUMBAR SPINE - 2-3 VIEW COMPARISON:  Chest radiograph 05/13/2022, lumbar spine radiographs 02/05/2020 FINDINGS: Thoracic: The upper thoracic vertebral bodies are suboptimally assessed due to overlying structures. The imaged thoracic vertebral body heights are preserved, without evidence of acute fracture. Alignment is normal. There is mild multilevel degenerative endplate change. The imaged heart and lungs unremarkable Lumbar: There are 5 non-rib-bearing lumbar-type vertebral bodies. Vertebral body heights are preserved, without evidence of acute fracture. Alignment is normal. There is mild-to-moderate disc space narrowing at L4-L5 and L5-S1, and moderate facet arthropathy at L3-L4 through L5-S1. The SI joints are intact.  The soft tissues are unremarkable. IMPRESSION: 1. No acute finding in the thoracic or lumbar spine. 2. Degenerative changes in the lower lumbar spine as above. Electronically Signed  By:  Lesia Hausen M.D.   On: 07/16/2022 11:25   DG Lumbar Spine 2-3 Views  Result Date: 07/16/2022 CLINICAL DATA:  Upper/mid and low back pain for 2 days after a fall. EXAM: THORACIC SPINE 2 VIEWS; LUMBAR SPINE - 2-3 VIEW COMPARISON:  Chest radiograph 05/13/2022, lumbar spine radiographs 02/05/2020 FINDINGS: Thoracic: The upper thoracic vertebral bodies are suboptimally assessed due to overlying structures. The imaged thoracic vertebral body heights are preserved, without evidence of acute fracture. Alignment is normal. There is mild multilevel degenerative endplate change. The imaged heart and lungs unremarkable Lumbar: There are 5 non-rib-bearing lumbar-type vertebral bodies. Vertebral body heights are preserved, without evidence of acute fracture. Alignment is normal. There is mild-to-moderate disc space narrowing at L4-L5 and L5-S1, and moderate facet arthropathy at L3-L4 through L5-S1. The SI joints are intact.  The soft tissues are unremarkable. IMPRESSION: 1. No acute finding in the thoracic or lumbar spine. 2. Degenerative changes in the lower lumbar spine as above. Electronically Signed   By: Lesia Hausen M.D.   On: 07/16/2022 11:25     ASSESSMENT & PLAN: A total of 47 minutes was spent with the patient and counseling/coordination of care regarding preparing for this visit, review of most recent office visit notes, review of most recent blood work results, review of most recent chest x-ray report, review of most recent colonoscopy report, review of chronic medical conditions under management, review of all medications, prognosis, need for additional testing, documentation and need for follow-up.  Problem List Items Addressed This Visit       Cardiovascular and Mediastinum   Essential hypertension    BP Readings from Last 3 Encounters:  07/16/22 130/78  05/13/22 130/70  05/04/22 137/80  Well-controlled hypertension Continue metoprolol succinate 25 mg daily and valsartan HCT 160-12.5 mg  daily         Respiratory   Smokers' cough (HCC)    Normal recent chest x-ray Recommend lung cancer screening CT scan Referral placed today        Endocrine   Dyslipidemia associated with type 2 diabetes mellitus (HCC)    Stable condition Lab Results  Component Value Date   HGBA1C 6.6 (H) 05/13/2022  Continue metformin 1000 mg twice a day and atorvastatin 20 mg daily         Nervous and Auditory   Chronic bilateral low back pain with left-sided sciatica    Recent spine MRIs unremarkable Recently evaluated by orthopedist Was referred to neurologist for bilateral leg numbness      Relevant Medications   busPIRone (BUSPAR) 7.5 MG tablet     Other   Depression with anxiety    Still very active.  Anxiety primary component Affecting quality of life Continues Lexapro 10 mg daily and alprazolam as needed Recommend to start BuSpar 7.5 mg twice a day Contributing to weight loss issues Has seen psychiatrist in the past.  Does not want to go in that direction at this time.      Relevant Medications   busPIRone (BUSPAR) 7.5 MG tablet   Current smoker   Relevant Orders   Ambulatory Referral Lung Cancer Screening [facility]   Chronic anxiety    Continues Lexapro 10 mg daily and alprazolam as needed Recommend to start BuSpar 7.5 mg twice a day May need psychiatry referral      Relevant Medications   busPIRone (BUSPAR) 7.5 MG tablet   Weight loss - Primary    Continues to take place Loss of appetite Increased emotional  stress in her life Recent workup so far negative for malignancy CT scan of abdomen and pelvis scheduled for tomorrow      Cloudy urine    Urinalysis and urine culture sent today May have UTI.      Relevant Orders   Urinalysis (Completed)   Urine Culture   Other Visit Diagnoses     Contusion of right side of back, initial encounter       Relevant Orders   DG Thoracic Spine 2 View (Completed)   DG Lumbar Spine 2-3 Views (Completed)    Unintentional weight loss       Relevant Orders   Ambulatory Referral Lung Cancer Screening [facility]        Patient Instructions  Managing Anxiety, Adult After being diagnosed with anxiety, you may be relieved to know why you have felt or behaved a certain way. You may also feel overwhelmed about the treatment ahead and what it will mean for your life. With care and support, you can manage your anxiety. How to manage lifestyle changes Understanding the difference between stress and anxiety Although stress can play a role in anxiety, it is not the same as anxiety. Stress is your body's reaction to life changes and events, both good and bad. Stress is often caused by something external, such as a deadline, test, or competition. It normally goes away after the event has ended and will last just a few hours. But, stress can be ongoing and can lead to more than just stress. Anxiety is caused by something internal, such as imagining a terrible outcome or worrying that something will go wrong that will greatly upset you. Anxiety often does not go away even after the event is over, and it can become a long-term (chronic) worry. Lowering stress and anxiety Talk with your health care provider or a counselor to learn more about lowering anxiety and stress. They may suggest tension-reduction techniques, such as: Music. Spend time creating or listening to music that you enjoy and that inspires you. Mindfulness-based meditation. Practice being aware of your normal breaths while not trying to control your breathing. It can be done while sitting or walking. Centering prayer. Focus on a word, phrase, or sacred image that means something to you and brings you peace. Deep breathing. Expand your stomach and inhale slowly through your nose. Hold your breath for 3-5 seconds. Then breathe out slowly, letting your stomach muscles relax. Self-talk. Learn to notice and spot thought patterns that lead to anxiety  reactions. Change those patterns to thoughts that feel peaceful. Muscle relaxation. Take time to tense muscles and then relax them. Choose a tension-reduction technique that fits your lifestyle and personality. These techniques take time and practice. Set aside 5-15 minutes a day to do them. Specialized therapists can offer counseling and training in these techniques. The training to help with anxiety may be covered by some insurance plans. Other things you can do to manage stress and anxiety include: Keeping a stress diary. This can help you learn what triggers your reaction and then learn ways to manage your response. Thinking about how you react to certain situations. You may not be able to control everything, but you can control your response. Making time for activities that help you relax and not feeling guilty about spending your time in this way. Doing visual imagery. This involves imagining or creating mental pictures to help you relax. Practicing yoga. Through yoga poses, you can lower tension and relax.  Medicines Medicines for  anxiety include: Antidepressant medicines. These are usually prescribed for long-term daily control. Anti-anxiety medicines. These may be added in severe cases, especially when panic attacks occur. When used together, medicines, psychotherapy, and tension-reduction techniques may be the most effective treatment. Relationships Relationships can play a big part in helping you recover. Spend more time connecting with trusted friends and family members. Think about going to couples counseling if you have a partner, taking family education classes, or going to family therapy. Therapy can help you and others better understand your anxiety. How to recognize changes in your anxiety Everyone responds differently to treatment for anxiety. Recovery from anxiety happens when symptoms lessen and stop interfering with your daily life at home or work. This may mean that you will  start to: Have better concentration and focus. Worry will interfere less in your daily thinking. Sleep better. Be less irritable. Have more energy. Have improved memory. Try to recognize when your condition is getting worse. Contact your provider if your symptoms interfere with home or work and you feel like your condition is not improving. Follow these instructions at home: Activity Exercise. Adults should: Exercise for at least 150 minutes each week. The exercise should increase your heart rate and make you sweat (moderate-intensity exercise). Do strengthening exercises at least twice a week. Get the right amount and quality of sleep. Most adults need 7-9 hours of sleep each night. Lifestyle  Eat a healthy diet that includes plenty of vegetables, fruits, whole grains, low-fat dairy products, and lean protein. Do not eat a lot of foods that are high in fats, added sugars, or salt (sodium). Make choices that simplify your life. Do not use any products that contain nicotine or tobacco. These products include cigarettes, chewing tobacco, and vaping devices, such as e-cigarettes. If you need help quitting, ask your provider. Avoid caffeine, alcohol, and certain over-the-counter cold medicines. These may make you feel worse. Ask your pharmacist which medicines to avoid. General instructions Take over-the-counter and prescription medicines only as told by your provider. Keep all follow-up visits. This is to make sure you are managing your anxiety well or if you need more support. Where to find support You can get help and support from: Self-help groups. Online and [employer]. A trusted spiritual leader. Couples counseling. Family education classes. Family therapy. Where to find more information You may find that joining a support group helps you deal with your anxiety. The following sources can help you find counselors or support groups near you: Mental Health America:  mentalhealthamerica.net Anxiety and Depression Association of [place] (ADAA): adaa.org The [employer] on Mental Illness (NAMI): nami.org Contact a health care provider if: You have a hard time staying focused or finishing tasks. You spend many hours a day feeling worried about everyday life. You are very tired because you cannot stop worrying. You start to have headaches or often feel tense. You have chronic nausea or diarrhea. Get help right away if: Your heart feels like it is racing. You have shortness of breath. You have thoughts of hurting yourself or others. Get help right away if you feel like you may hurt yourself or others, or have thoughts about taking your own life. Go to your nearest emergency room or: Call 911. Call the National Suicide Prevention Lifeline at (551)503-2239 or 988. This is open 24 hours a day. Text the Crisis Text Line at 8287350404. This information is not intended to replace advice given to you by your health care provider. Make sure you discuss any  questions you have with your health care provider. Document Revised: 10/21/2021 Document Reviewed: 05/05/2020 Elsevier Patient Education  2024 Elsevier Inc.     Edwina Barth, MD [facility]

## 2022-07-16 NOTE — Assessment & Plan Note (Signed)
Normal recent chest x-ray Recommend lung cancer screening CT scan Referral placed today

## 2022-07-17 ENCOUNTER — Ambulatory Visit
Admission: RE | Admit: 2022-07-17 | Discharge: 2022-07-17 | Disposition: A | Discharge status: Home / Self Care | Payer: Medicare HMO | Source: Ambulatory Visit | Attending: Emergency Medicine | Admitting: Emergency Medicine

## 2022-07-17 DIAGNOSIS — R634 Abnormal weight loss: Secondary | ICD-10-CM

## 2022-07-17 DIAGNOSIS — I700 Atherosclerosis of aorta: Secondary | ICD-10-CM | POA: Diagnosis not present

## 2022-07-17 DIAGNOSIS — K5730 Diverticulosis of large intestine without perforation or abscess without bleeding: Secondary | ICD-10-CM | POA: Diagnosis not present

## 2022-07-17 LAB — URINE CULTURE

## 2022-07-17 MED ORDER — IOPAMIDOL (ISOVUE-300) INJECTION 61%
100.0000 mL | Freq: Once | INTRAVENOUS | Status: AC | PRN
  Administered 2022-07-17: 100 mL via INTRAVENOUS

## 2022-07-23 ENCOUNTER — Encounter: Payer: Self-pay | Admitting: Emergency Medicine

## 2022-08-07 ENCOUNTER — Telehealth: Payer: Medicare HMO | Admitting: Nurse Practitioner

## 2022-08-07 NOTE — Telephone Encounter (Signed)
Pt was unable to do VV so she requested Dr. Alvy Bimler look at her message below and give her any advise. Pt refused to be seen virtually or in office.   "I have been taking Nexium for 14 days (maximum amount) per instructions, with no results.  I spoke with my pharmacist, he told me to consult you, as an ulcer is caused by bacteria and that I will need an antibiotic from my doctor.  I am not able to come in for an appt, as I have not been able to leave my home since the scan of my stomach. Both my legs have gone totally numb along with my back and fingertips. I am not able to walk without assistance taking about two steps at a time and have fallen a couple more times.  I am in really bad shape when it comes to my mobility, and I am scared of more falls.   [name] has taken on the role of caretaker. He cooks my meals, cleans, and helps me get to the bathroom.  He does this plus working.  It hurts me to know that he has to do so much for me.   I have an appt with Dr. Criss Rosales, a neurologist, on July 19th about my nerves.  A friend of mine is borrowing an SUV to see if I am able to get into that car. [name] has taken that day off to help.   Along with all this going on, I still have pains in my stomach, some so sharp, I bend over with pain until it goes away. I wish I could come in to see you, but I can't.   I have worries what he may find during my appt. on the 19th.  I will make sure you get a copy of his findings and any tests he performs. I do hope you will make an exception in this matter and give me a prescription for this ulcer, so I can have some relief, while also dealing with this nerve situation."

## 2022-08-07 NOTE — Telephone Encounter (Signed)
FYI

## 2022-08-08 ENCOUNTER — Other Ambulatory Visit: Payer: Self-pay | Admitting: Emergency Medicine

## 2022-08-08 DIAGNOSIS — K2100 Gastro-esophageal reflux disease with esophagitis, without bleeding: Secondary | ICD-10-CM

## 2022-08-08 MED ORDER — PANTOPRAZOLE SODIUM 40 MG PO TBEC: DELAYED_RELEASE_TABLET | ORAL | 3 refills | Status: DC

## 2022-08-10 NOTE — Telephone Encounter (Signed)
She does not need referral.  Dr. Elnoria Howard, [facility], can take care of this.  Thanks.

## 2022-08-14 DIAGNOSIS — R531 Weakness: Secondary | ICD-10-CM | POA: Diagnosis not present

## 2022-08-14 DIAGNOSIS — Z1330 Encounter for screening examination for mental health and behavioral disorders, unspecified: Secondary | ICD-10-CM | POA: Diagnosis not present

## 2022-08-18 ENCOUNTER — Other Ambulatory Visit: Payer: Self-pay | Admitting: Emergency Medicine

## 2022-08-18 DIAGNOSIS — E119 Type 2 diabetes mellitus without complications: Secondary | ICD-10-CM

## 2022-08-18 DIAGNOSIS — F418 Other specified anxiety disorders: Secondary | ICD-10-CM

## 2022-08-18 DIAGNOSIS — E785 Hyperlipidemia, unspecified: Secondary | ICD-10-CM

## 2022-09-03 ENCOUNTER — Ambulatory Visit (INDEPENDENT_AMBULATORY_CARE_PROVIDER_SITE_OTHER): Payer: Medicare HMO

## 2022-09-03 ENCOUNTER — Encounter: Payer: Self-pay | Admitting: Emergency Medicine

## 2022-09-03 DIAGNOSIS — Z0000 Encounter for general adult medical examination without abnormal findings: Secondary | ICD-10-CM

## 2022-09-03 NOTE — Progress Notes (Signed)
Subjective:   Jasmine Long is a 72 y.o. female who presents for Medicare Annual (Subsequent) preventive examination.  Visit Complete: Virtual  I connected with  Jasmine Long on 09/03/22 by a audio enabled telemedicine application and verified that I am speaking with the correct person using two identifiers.  Patient Location: Home  Provider Location: Office/Clinic  I discussed the limitations of evaluation and management by telemedicine. The patient expressed understanding and agreed to proceed.  Vital Signs: Unable to obtain new vitals due to this being a telehealth visit.  Review of Systems     Cardiac Risk Factors include: advanced age (>38men, >58 women);diabetes mellitus;dyslipidemia;hypertension     Objective:    Today's Vitals   There is no height or weight on file to calculate BMI.     09/03/2022    9:42 AM 05/04/2022   10:31 AM 10/28/2021    5:18 AM 10/03/2021   11:06 AM 05/05/2021   10:51 AM 11/04/2020    1:19 PM 01/25/2020    9:01 AM  Advanced Directives  Does Patient Have a Medical Advance Directive? No No No No No No No  Would patient like information on creating a medical advance directive?  No - Patient declined   No - Patient declined      Current Medications (verified) Outpatient Encounter Medications as of 09/03/2022  Medication Sig   ALPRAZolam (XANAX) 0.5 MG tablet Take 1 tablet (0.5 mg total) by mouth daily as needed for anxiety.   atorvastatin (LIPITOR) 20 MG tablet Take 1 tablet by mouth once daily   buPROPion (WELLBUTRIN XL) 300 MG 24 hr tablet Take 1 tablet by mouth once daily   busPIRone (BUSPAR) 7.5 MG tablet Take 1 tablet (7.5 mg total) by mouth 2 (two) times daily.   escitalopram (LEXAPRO) 10 MG tablet Take 1 tablet (10 mg total) by mouth daily.   levothyroxine (SYNTHROID) 125 MCG tablet TAKE 1 TABLET BY MOUTH ONCE DAILY BEFORE BREAKFAST   metFORMIN (GLUCOPHAGE) 1000 MG tablet TAKE 1 TABLET BY MOUTH TWICE DAILY WITH A MEAL   metoprolol succinate  (TOPROL-XL) 25 MG 24 hr tablet Take 1 tablet by mouth once daily   nicotine (NICODERM CQ - DOSED IN MG/24 HOURS) 21 mg/24hr patch Place 1 patch (21 mg total) onto the skin daily.   pantoprazole (PROTONIX) 40 MG tablet Take 1 tablet twice a day for 2 weeks and then daily after that   polyethylene glycol (MIRALAX / GLYCOLAX) 17 g packet Take 17 g by mouth daily.   valsartan-hydrochlorothiazide (DIOVAN-HCT) 160-12.5 MG tablet Take 1 tablet by mouth once daily   No facility-administered encounter medications on file as of 09/03/2022.    Allergies (verified) Patient has no known allergies.   History: Past Medical History:  Diagnosis Date   Allergy    Anxiety    Depression    Heart murmur    Hypertension    Thyroid disease    Ulcer    Past Surgical History:  Procedure Laterality Date   BREAST SURGERY     CESAREAN SECTION     CHOLECYSTECTOMY     NASAL SEPTUM SURGERY     Family History  Problem Relation Age of Onset   Lung cancer Mother    Dementia Father    Atrial fibrillation Father    Social History   Socioeconomic History   Marital status: Divorced    Spouse name: Not on file   Number of children: 2   Years of education: Not  on file   Highest education level: Not on file  Occupational History   Occupation: Unemployed  Tobacco Use   Smoking status: Every Day    Current packs/day: 0.00    Average packs/day: 1.5 packs/day for 40.0 years (60.0 ttl pk-yrs)    Types: Cigarettes    Start date: 01/20/1972    Last attempt to quit: 01/20/2012    Years since quitting: 10.6   Smokeless tobacco: Never   Tobacco comments:    Pt doing E-cigarette as of 01/20/12  Vaping Use   Vaping status: Former  Substance and Sexual Activity   Alcohol use: No   Drug use: No   Sexual activity: Never  Other Topics Concern   Not on file  Social History Narrative   Patient on worker's compensation for an injury sustained on the job 5 years ago.      Right handed and Left Handed        Lives in a two story home       One son deceased.    Social Determinants of Health   Financial Resource Strain: Low Risk  (09/03/2022)   Overall Financial Resource Strain (CARDIA)    Difficulty of Paying Living Expenses: Not hard at all  Food Insecurity: No Food Insecurity (09/03/2022)   Hunger Vital Sign    Worried About Running Out of Food in the Last Year: Never true    Ran Out of Food in the Last Year: Never true  Transportation Needs: No Transportation Needs (09/03/2022)   PRAPARE - Administrator, Civil Service (Medical): No    Lack of Transportation (Non-Medical): No  Physical Activity: Inactive (09/03/2022)   Exercise Vital Sign    Days of Exercise per Week: 0 days    Minutes of Exercise per Session: 0 min  Stress: No Stress Concern Present (09/03/2022)   [employer] of Occupational Health - Occupational Stress Questionnaire    Feeling of Stress : Not at all  Social Connections: Socially Isolated (09/03/2022)   Social Connection and Isolation Panel [NHANES]    Frequency of Communication with Friends and Family: More than three times a week    Frequency of Social Gatherings with Friends and Family: More than three times a week    Attends Religious Services: Never    Database administrator or Organizations: No    Attends Banker Meetings: Never    Marital Status: Divorced    Tobacco Counseling Ready to quit: No Counseling given: Not Answered Tobacco comments: Pt doing E-cigarette as of 01/20/12   Clinical Intake:  Pre-visit preparation completed: Yes  Pain : No/denies pain     Nutritional Risks: None Diabetes: Yes CBG done?: No Did pt. bring in CBG monitor from home?: No  How often do you need to have someone help you when you read instructions, pamphlets, or other written materials from your doctor or pharmacy?: 1 - Never  Interpreter Needed?: No  Information entered by :: NAllen LPN   Activities of Daily Living    09/03/2022     9:34 AM  In your present state of health, do you have any difficulty performing the following activities:  Hearing? 0  Vision? 0  Difficulty concentrating or making decisions? 1  Comment on gabapentin  Walking or climbing stairs? 1  Comment due numbness  Dressing or bathing? 1  Doing errands, shopping? 0  Preparing Food and eating ? N  Using the Toilet? N  In the past six months,  have you accidently leaked urine? Y  Comment a week ago once  Do you have problems with loss of bowel control? N  Managing your Medications? N  Managing your Finances? N  Housekeeping or managing your Housekeeping? Y    Patient Care Team: [name], MD as PCP - General (Internal Medicine) Glendale Chard, DO as Consulting Physician (Neurology)  Indicate any recent Medical Services you may have received from other than [facility] providers in the past year (date may be approximate).     Assessment:   This is a routine wellness examination for Jasmine Long.  Hearing/Vision screen Hearing Screening - Comments:: Denies hearing issues Vision Screening - Comments:: Regular eye exams  Dietary issues and exercise activities discussed:     Goals Addressed             This Visit's Progress    Patient Stated       09/03/2022, wants to get numbing taken care of       Depression Screen    09/03/2022    9:45 AM 07/16/2022   10:13 AM 07/16/2022   10:11 AM 05/13/2022    2:28 PM 04/22/2022   11:12 AM 04/07/2022    2:34 PM 12/25/2021   10:50 AM  PHQ 2/9 Scores  PHQ - 2 Score 1 0 0 0 0 0 0  PHQ- 9 Score 1 0    0 0    Fall Risk    09/03/2022    9:43 AM 07/16/2022   10:11 AM 05/13/2022    2:28 PM 04/22/2022   11:12 AM 04/07/2022    2:33 PM  Fall Risk   Falls in the past year? 1 1 0 0 1  Comment leg gave out      Number falls in past yr: 1 0 0 0 1  Injury with Fall? 0 0 0 0 1  Risk for fall due to : Impaired balance/gait;Impaired mobility;History of fall(s);Medication side effect Impaired balance/gait No  Fall Risks No Fall Risks Impaired balance/gait  Follow up Falls prevention discussed;Falls evaluation completed Falls evaluation completed Falls evaluation completed Falls evaluation completed     MEDICARE RISK AT HOME:  Medicare Risk at Home - 09/03/22 0944     Any stairs in or around the home? Yes    If so, are there any without handrails? No    Home free of loose throw rugs in walkways, pet beds, electrical cords, etc? Yes    Adequate lighting in your home to reduce risk of falls? Yes    Life alert? No    Use of a cane, walker or w/c? Yes    Grab bars in the bathroom? No    Shower chair or bench in shower? No    Elevated toilet seat or a handicapped toilet? No             TIMED UP AND GO:  Was the test performed?  No    Cognitive Function:        09/03/2022    9:47 AM 05/26/2019   10:21 AM 05/23/2018   10:08 AM  6CIT Screen  What Year? 0 points 0 points 0 points  What month? 0 points 0 points 0 points  What time? 0 points 0 points 0 points  Count back from 20 0 points 0 points 0 points  Months in reverse 0 points 0 points 0 points  Repeat phrase 0 points 0 points 0 points  Total Score 0 points 0 points 0  points    Immunizations There is no immunization history for the selected administration types on file for this patient.  TDAP status: Up to date  Flu Vaccine status: Due, Education has been provided regarding the importance of this vaccine. Advised may receive this vaccine at local pharmacy or Health Dept. Aware to provide a copy of the vaccination record if obtained from local pharmacy or Health Dept. Verbalized acceptance and understanding.  Pneumococcal vaccine status: Declined,  Education has been provided regarding the importance of this vaccine but patient still declined. Advised may receive this vaccine at local pharmacy or Health Dept. Aware to provide a copy of the vaccination record if obtained from local pharmacy or Health Dept. Verbalized acceptance and  understanding.   Covid-19 vaccine status: Declined, Education has been provided regarding the importance of this vaccine but patient still declined. Advised may receive this vaccine at local pharmacy or Health Dept.or vaccine clinic. Aware to provide a copy of the vaccination record if obtained from local pharmacy or Health Dept. Verbalized acceptance and understanding.  Qualifies for Shingles Vaccine? Yes   Zostavax completed No   Shingrix Completed?: No.    Education has been provided regarding the importance of this vaccine. Patient has been advised to call insurance company to determine out of pocket expense if they have not yet received this vaccine. Advised may also receive vaccine at local pharmacy or Health Dept. Verbalized acceptance and understanding.  Screening Tests Health Maintenance  Topic Date Due   OPHTHALMOLOGY EXAM  Never done   MAMMOGRAM  Never done   Zoster Vaccines- Shingrix (1 of 2) Never done   Lung Cancer Screening  10/20/2009   DEXA SCAN  Never done   Diabetic kidney evaluation - Urine ACR  06/29/2018   FOOT EXAM  08/09/2020   COVID-19 Vaccine (1 - 2023-24 season) Never done   INFLUENZA VACCINE  08/27/2022   Pneumonia Vaccine 52+ Years old (1 of 2 - PCV) 09/09/2022 (Originally 04/29/1956)   HEMOGLOBIN A1C  11/12/2022   Diabetic kidney evaluation - eGFR measurement  05/13/2023   Medicare Annual Wellness (AWV)  09/03/2023   Colonoscopy  06/01/2025   Hepatitis C Screening  Completed   HPV VACCINES  Aged Out   DTaP/Tdap/Td  Discontinued    Health Maintenance  Health Maintenance Due  Topic Date Due   OPHTHALMOLOGY EXAM  Never done   MAMMOGRAM  Never done   Zoster Vaccines- Shingrix (1 of 2) Never done   Lung Cancer Screening  10/20/2009   DEXA SCAN  Never done   Diabetic kidney evaluation - Urine ACR  06/29/2018   FOOT EXAM  08/09/2020   COVID-19 Vaccine (1 - 2023-24 season) Never done   INFLUENZA VACCINE  08/27/2022    Colorectal cancer screening: Type  of screening: Colonoscopy. Completed 06/02/2022. Repeat every 3 years  Mammogram status: declines  Bone Density status: declines at this time  Lung Cancer Screening: (Low Dose CT Chest recommended if Age 67-80 years, 20 pack-year currently smoking OR have quit w/in 15years.) does qualify.   Lung Cancer Screening Referral: ordered 07/16/2022  Additional Screening:  Hepatitis C Screening: does qualify; Completed 09/20/2017  Vision Screening: Recommended annual ophthalmology exams for early detection of glaucoma and other disorders of the eye. Is the patient up to date with their annual eye exam?  No  Who is the provider or what is the name of the office in which the patient attends annual eye exams? Can't remember name  If pt is  not established with a provider, would they like to be referred to a provider to establish care? No .   Dental Screening: Recommended annual dental exams for proper oral hygiene  Diabetic Foot Exam: Diabetic Foot Exam: Overdue, Pt has been advised about the importance in completing this exam. Pt is scheduled for diabetic foot exam on next appointment.  Community Resource Referral / Chronic Care Management: CRR required this visit?  No   CCM required this visit?  No     Plan:     I have personally reviewed and noted the following in the patient's chart:   Medical and social history Use of alcohol, tobacco or illicit drugs  Current medications and supplements including opioid prescriptions. Patient is not currently taking opioid prescriptions. Functional ability and status Nutritional status Physical activity Advanced directives List of other physicians Hospitalizations, surgeries, and ER visits in previous 12 months Vitals Screenings to include cognitive, depression, and falls Referrals and appointments  In addition, I have reviewed and discussed with patient certain preventive protocols, quality metrics, and best practice recommendations. A written  personalized care plan for preventive services as well as general preventive health recommendations were provided to patient.     Barb Merino, LPN   05/29/6642   After Visit Summary: (MyChart) Due to this being a telephonic visit, the after visit summary with patients personalized plan was offered to patient via MyChart   Nurse Notes: none

## 2022-09-03 NOTE — Patient Instructions (Signed)
Jasmine Long , Thank you for taking time to come for your Medicare Wellness Visit. I appreciate your ongoing commitment to your health goals. Please review the following plan we discussed and let me know if I can assist you in the future.   Referrals/Orders/Follow-Ups/Clinician Recommendations: has lower body numbness. Seeing a neurologist soon  This is a list of the screening recommended for you and due dates:  Health Maintenance  Topic Date Due   Eye exam for diabetics  Never done   Mammogram  Never done   Zoster (Shingles) Vaccine (1 of 2) Never done   Screening for Lung Cancer  10/20/2009   DEXA scan (bone density measurement)  Never done   Yearly kidney health urinalysis for diabetes  06/29/2018   Complete foot exam   08/09/2020   COVID-19 Vaccine (1 - 2023-24 season) Never done   Flu Shot  08/27/2022   Pneumonia Vaccine (1 of 2 - PCV) 09/09/2022*   Hemoglobin A1C  11/12/2022   Yearly kidney function blood test for diabetes  05/13/2023   Medicare Annual Wellness Visit  09/03/2023   Colon Cancer Screening  06/01/2025   Hepatitis C Screening  Completed   HPV Vaccine  Aged Out   DTaP/Tdap/Td vaccine  Discontinued  *Topic was postponed. The date shown is not the original due date.    Advanced directives: (ACP Link)Information on Advanced Care Planning can be found at [facility] Advance Health Care Directives Advance Health Care Directives ([email or web address]   Next Medicare Annual Wellness Visit scheduled for next year: Yes  Preventive Care 65 Years and Older, Female Preventive care refers to lifestyle choices and visits with your health care provider that can promote health and wellness. What does preventive care include? A yearly physical exam. This is also called an annual well check. Dental exams once or twice a year. Routine eye exams. Ask your health care provider how often you should have your eyes checked. Personal lifestyle choices, including: Daily care of  your teeth and gums. Regular physical activity. Eating a healthy diet. Avoiding tobacco and drug use. Limiting alcohol use. Practicing safe sex. Taking low-dose aspirin every day. Taking vitamin and mineral supplements as recommended by your health care provider. What happens during an annual well check? The services and screenings done by your health care provider during your annual well check will depend on your age, overall health, lifestyle risk factors, and family history of disease. Counseling  Your health care provider may ask you questions about your: Alcohol use. Tobacco use. Drug use. Emotional well-being. Home and relationship well-being. Sexual activity. Eating habits. History of falls. Memory and ability to understand (cognition). Work and work Astronomer. Reproductive health. Screening  You may have the following tests or measurements: Height, weight, and BMI. Blood pressure. Lipid and cholesterol levels. These may be checked every 5 years, or more frequently if you are over 66 years old. Skin check. Lung cancer screening. You may have this screening every year starting at age 29 if you have a 30-pack-year history of smoking and currently smoke or have quit within the past 15 years. Fecal occult blood test (FOBT) of the stool. You may have this test every year starting at age 29. Flexible sigmoidoscopy or colonoscopy. You may have a sigmoidoscopy every 5 years or a colonoscopy every 10 years starting at age 41. Hepatitis C blood test. Hepatitis B blood test. Sexually transmitted disease (STD) testing. Diabetes screening. This is done by checking your blood sugar (glucose)  after you have not eaten for a while (fasting). You may have this done every 1-3 years. Bone density scan. This is done to screen for osteoporosis. You may have this done starting at age 27. Mammogram. This may be done every 1-2 years. Talk to your health care provider about how often you should  have regular mammograms. Talk with your health care provider about your test results, treatment options, and if necessary, the need for more tests. Vaccines  Your health care provider may recommend certain vaccines, such as: Influenza vaccine. This is recommended every year. Tetanus, diphtheria, and acellular pertussis (Tdap, Td) vaccine. You may need a Td booster every 10 years. Zoster vaccine. You may need this after age 60. Pneumococcal 13-valent conjugate (PCV13) vaccine. One dose is recommended after age 50. Pneumococcal polysaccharide (PPSV23) vaccine. One dose is recommended after age 46. Talk to your health care provider about which screenings and vaccines you need and how often you need them. This information is not intended to replace advice given to you by your health care provider. Make sure you discuss any questions you have with your health care provider. Document Released: 02/08/2015 Document Revised: 10/02/2015 Document Reviewed: 11/13/2014 Elsevier Interactive Patient Education  2017 [employer].  Fall Prevention in the Home Falls can cause injuries. They can happen to people of all ages. There are many things you can do to make your home safe and to help prevent falls. What can I do on the outside of my home? Regularly fix the edges of walkways and driveways and fix any cracks. Remove anything that might make you trip as you walk through a door, such as a raised step or threshold. Trim any bushes or trees on the path to your home. Use bright outdoor lighting. Clear any walking paths of anything that might make someone trip, such as rocks or tools. Regularly check to see if handrails are loose or broken. Make sure that both sides of any steps have handrails. Any raised decks and porches should have guardrails on the edges. Have any leaves, snow, or ice cleared regularly. Use sand or salt on walking paths during winter. Clean up any spills in your garage right away. This  includes oil or grease spills. What can I do in the bathroom? Use night lights. Install grab bars by the toilet and in the tub and shower. Do not use towel bars as grab bars. Use non-skid mats or decals in the tub or shower. If you need to sit down in the shower, use a plastic, non-slip stool. Keep the floor dry. Clean up any water that spills on the floor as soon as it happens. Remove soap buildup in the tub or shower regularly. Attach bath mats securely with double-sided non-slip rug tape. Do not have throw rugs and other things on the floor that can make you trip. What can I do in the bedroom? Use night lights. Make sure that you have a light by your bed that is easy to reach. Do not use any sheets or blankets that are too big for your bed. They should not hang down onto the floor. Have a firm chair that has side arms. You can use this for support while you get dressed. Do not have throw rugs and other things on the floor that can make you trip. What can I do in the kitchen? Clean up any spills right away. Avoid walking on wet floors. Keep items that you use a lot in easy-to-reach places. If  you need to reach something above you, use a strong step stool that has a grab bar. Keep electrical cords out of the way. Do not use floor polish or wax that makes floors slippery. If you must use wax, use non-skid floor wax. Do not have throw rugs and other things on the floor that can make you trip. What can I do with my stairs? Do not leave any items on the stairs. Make sure that there are handrails on both sides of the stairs and use them. Fix handrails that are broken or loose. Make sure that handrails are as long as the stairways. Check any carpeting to make sure that it is firmly attached to the stairs. Fix any carpet that is loose or worn. Avoid having throw rugs at the top or bottom of the stairs. If you do have throw rugs, attach them to the floor with carpet tape. Make sure that you  have a light switch at the top of the stairs and the bottom of the stairs. If you do not have them, ask someone to add them for you. What else can I do to help prevent falls? Wear shoes that: Do not have high heels. Have rubber bottoms. Are comfortable and fit you well. Are closed at the toe. Do not wear sandals. If you use a stepladder: Make sure that it is fully opened. Do not climb a closed stepladder. Make sure that both sides of the stepladder are locked into place. Ask someone to hold it for you, if possible. Clearly mark and make sure that you can see: Any grab bars or handrails. First and last steps. Where the edge of each step is. Use tools that help you move around (mobility aids) if they are needed. These include: Canes. Walkers. Scooters. Crutches. Turn on the lights when you go into a dark area. Replace any light bulbs as soon as they burn out. Set up your furniture so you have a clear path. Avoid moving your furniture around. If any of your floors are uneven, fix them. If there are any pets around you, be aware of where they are. Review your medicines with your doctor. Some medicines can make you feel dizzy. This can increase your chance of falling. Ask your doctor what other things that you can do to help prevent falls. This information is not intended to replace advice given to you by your health care provider. Make sure you discuss any questions you have with your health care provider. Document Released: 11/08/2008 Document Revised: 06/20/2015 Document Reviewed: 02/16/2014 Elsevier Interactive Patient Education  2017 [employer].

## 2022-09-10 ENCOUNTER — Other Ambulatory Visit: Payer: Self-pay | Admitting: Emergency Medicine

## 2022-09-10 DIAGNOSIS — F4323 Adjustment disorder with mixed anxiety and depressed mood: Secondary | ICD-10-CM

## 2022-09-10 DIAGNOSIS — F418 Other specified anxiety disorders: Secondary | ICD-10-CM

## 2022-09-11 ENCOUNTER — Encounter: Payer: Self-pay | Admitting: Diagnostic Neuroimaging

## 2022-09-11 ENCOUNTER — Ambulatory Visit: Payer: Medicare HMO | Admitting: Diagnostic Neuroimaging

## 2022-09-11 VITALS — BP 148/81 | HR 119

## 2022-09-11 DIAGNOSIS — R200 Anesthesia of skin: Secondary | ICD-10-CM | POA: Diagnosis not present

## 2022-09-11 DIAGNOSIS — G629 Polyneuropathy, unspecified: Secondary | ICD-10-CM

## 2022-09-11 DIAGNOSIS — R202 Paresthesia of skin: Secondary | ICD-10-CM

## 2022-09-11 NOTE — Progress Notes (Signed)
[facility]  PATIENT: Jasmine Long DOB: December 17, 1950  REFERRING CLINICIAN: Julio Sicks, MD HISTORY FROM: patient REASON FOR VISIT: new consult   HISTORICAL  CHIEF COMPLAINT:  Chief Complaint  Patient presents with   New Patient (Initial Visit)    Pt alone, rm 7. She has had weakness lle. Worked with PT. After 3 days had numbness couldn't walk. She was evaluated with neuro surgery, Dr Jordan Likes who completed several MRI's and didn't see anything surgically causing her symptoms. No compression in spine. He referred to neurology. Had a NCV/EMG several years related to this injury. She complains of numbness down back, below her breast and bilateral legs. She has pain and there is nothing that she can take.     HISTORY OF PRESENT ILLNESS:   72 year old female here for evaluation of lower extremity numbness.  Around 2014 patient fell down at work, on her back, on concrete surface.  She had severe pain in her left shoulder, entire spine and left leg.  She was evaluated and treated through [employer].  She had some treatments including pain management, physical therapy and injections.  Around February 2024 patient was referred to another physical therapist for evaluation.  She had several rounds of therapy but seem to worsen following the sessions.  She had increasing pain in her lower back and increasing numbness in lower extremities.  She had additional test including MRI lumbar spine, MRI cervical thoracic spine, with follow-up in neurosurgery and orthopedic surgery clinics.  Surgical treatment was not recommended.  Conservative management was continued.  Due to ongoing symptoms patient was referred to neurology clinic for further evaluation.   REVIEW OF SYSTEMS: Full 14 system review of systems performed and negative with exception of: as per HPI.  ALLERGIES: No Known Allergies  HOME MEDICATIONS: Outpatient Medications Prior to Visit  Medication Sig  Dispense Refill   ALPRAZolam (XANAX) 0.5 MG tablet Take 1 tablet (0.5 mg total) by mouth daily as needed for anxiety. 30 tablet 1   atorvastatin (LIPITOR) 20 MG tablet Take 1 tablet by mouth once daily 90 tablet 1   buPROPion (WELLBUTRIN XL) 300 MG 24 hr tablet Take 1 tablet by mouth once daily 90 tablet 1   busPIRone (BUSPAR) 7.5 MG tablet Take 1 tablet (7.5 mg total) by mouth 2 (two) times daily. 60 tablet 1   escitalopram (LEXAPRO) 10 MG tablet Take 1 tablet (10 mg total) by mouth daily. 90 tablet 3   levothyroxine (SYNTHROID) 125 MCG tablet TAKE 1 TABLET BY MOUTH ONCE DAILY BEFORE BREAKFAST 90 tablet 0   metFORMIN (GLUCOPHAGE) 1000 MG tablet TAKE 1 TABLET BY MOUTH TWICE DAILY WITH A MEAL 180 tablet 1   metoprolol succinate (TOPROL-XL) 25 MG 24 hr tablet Take 1 tablet by mouth once daily 90 tablet 3   pantoprazole (PROTONIX) 40 MG tablet Take 1 tablet twice a day for 2 weeks and then daily after that 90 tablet 3   valsartan-hydrochlorothiazide (DIOVAN-HCT) 160-12.5 MG tablet Take 1 tablet by mouth once daily 90 tablet 3   polyethylene glycol (MIRALAX / GLYCOLAX) 17 g packet Take 17 g by mouth daily. 14 each 0   nicotine (NICODERM CQ - DOSED IN MG/24 HOURS) 21 mg/24hr patch Place 1 patch (21 mg total) onto the skin daily. 28 patch 3   No facility-administered medications prior to visit.    PAST MEDICAL HISTORY: Past Medical History:  Diagnosis Date   Allergy    Anxiety    Depression  Heart murmur    Hypertension    Thyroid disease    Ulcer     PAST SURGICAL HISTORY: Past Surgical History:  Procedure Laterality Date   BREAST SURGERY     CESAREAN SECTION     CHOLECYSTECTOMY     NASAL SEPTUM SURGERY      FAMILY HISTORY: Family History  Problem Relation Age of Onset   Lung cancer Mother    Dementia Father    Atrial fibrillation Father     SOCIAL HISTORY: Social History   Socioeconomic History   Marital status: Divorced    Spouse name: Not on file   Number of  children: 2   Years of education: Not on file   Highest education level: Not on file  Occupational History   Occupation: Unemployed  Tobacco Use   Smoking status: Every Day    Current packs/day: 0.00    Average packs/day: 1.5 packs/day for 40.0 years (60.0 ttl pk-yrs)    Types: Cigarettes    Start date: 01/20/1972    Last attempt to quit: 01/20/2012    Years since quitting: 10.6   Smokeless tobacco: Never   Tobacco comments:    Pt doing E-cigarette as of 01/20/12  Vaping Use   Vaping status: Former  Substance and Sexual Activity   Alcohol use: No   Drug use: No   Sexual activity: Never  Other Topics Concern   Not on file  Social History Narrative   Patient on worker's compensation for an injury sustained on the job 5 years ago.      Right handed and Left Handed       Lives in a two story home       One son deceased.    Social Determinants of Health   Financial Resource Strain: Low Risk  (09/03/2022)   Overall Financial Resource Strain (CARDIA)    Difficulty of Paying Living Expenses: Not hard at all  Food Insecurity: No Food Insecurity (09/03/2022)   Hunger Vital Sign    Worried About Running Out of Food in the Last Year: Never true    Ran Out of Food in the Last Year: Never true  Transportation Needs: No Transportation Needs (09/03/2022)   PRAPARE - Administrator, Civil Service (Medical): No    Lack of Transportation (Non-Medical): No  Physical Activity: Inactive (09/03/2022)   Exercise Vital Sign    Days of Exercise per Week: 0 days    Minutes of Exercise per Session: 0 min  Stress: No Stress Concern Present (09/03/2022)   [employer] of Occupational Health - Occupational Stress Questionnaire    Feeling of Stress : Not at all  Social Connections: Socially Isolated (09/03/2022)   Social Connection and Isolation Panel [NHANES]    Frequency of Communication with Friends and Family: More than three times a week    Frequency of Social Gatherings with Friends  and Family: More than three times a week    Attends Religious Services: Never    Database administrator or Organizations: No    Attends Banker Meetings: Never    Marital Status: Divorced  Catering manager Violence: Not At Risk (09/03/2022)   Humiliation, Afraid, Rape, and Kick questionnaire    Fear of Current or Ex-Partner: No    Emotionally Abused: No    Physically Abused: No    Sexually Abused: No     PHYSICAL EXAM  GENERAL EXAM/CONSTITUTIONAL: Vitals:  Vitals:   09/11/22 1109  BP: Marland Kitchen)  148/81  Pulse: (!) 119   There is no height or weight on file to calculate BMI. Wt Readings from Last 3 Encounters:  07/16/22 186 lb 4 oz (84.5 kg)  05/13/22 214 lb 2 oz (97.1 kg)  04/22/22 221 lb 6 oz (100.4 kg)   Patient is in no distress; well developed, nourished and groomed; neck is supple  CARDIOVASCULAR: Examination of carotid arteries is normal; no carotid bruits Regular rate and rhythm, no murmurs Examination of peripheral vascular system by observation and palpation is normal  EYES: Ophthalmoscopic exam of optic discs and posterior segments is normal; no papilledema or hemorrhages No results found.  MUSCULOSKELETAL: Gait, strength, tone, movements noted in Neurologic exam below  NEUROLOGIC: MENTAL STATUS:      No data to display         awake, alert, oriented to person, place and time recent and remote memory intact normal attention and concentration language fluent, comprehension intact, naming intact fund of knowledge appropriate  CRANIAL NERVE:  2nd - no papilledema on fundoscopic exam 2nd, 3rd, 4th, 6th - pupils equal and reactive to light, visual fields full to confrontation, extraocular muscles intact, no nystagmus 5th - facial sensation symmetric 7th - facial strength symmetric 8th - hearing intact 9th - palate elevates symmetrically, uvula midline 11th - shoulder shrug symmetric 12th - tongue protrusion midline  MOTOR:  normal bulk and  tone, full strength in the BUE, BLE; EXCEPT RLE (HF 3, KE 4, KF 3, DF 3) AND LLE (HF 4, KE / KF 4, DF 4+)  SENSORY:  normal and symmetric to light touch, temperature, vibration; DECR IN RIGHT LOWER EXT; SLIGHTLY DECR IN LEFT LEG  COORDINATION:  finger-nose-finger, fine finger movements normal  REFLEXES:  deep tendon reflexes TRACE and symmetric  GAIT/STATION:  IN WHEELCHAIR     DIAGNOSTIC DATA (LABS, IMAGING, TESTING) - I reviewed patient records, labs, notes, testing and imaging myself where available.  Lab Results  Component Value Date   WBC 12.1 (H) 05/13/2022   HGB 14.3 05/13/2022   HCT 42.8 05/13/2022   MCV 87.6 05/13/2022   PLT 426.0 (H) 05/13/2022      Component Value Date/Time   NA 135 05/13/2022 1459   NA 143 03/26/2020 1650   K 4.1 05/13/2022 1459   CL 99 05/13/2022 1459   CO2 25 05/13/2022 1459   GLUCOSE 104 (H) 05/13/2022 1459   BUN 17 05/13/2022 1459   BUN 15 03/26/2020 1650   CREATININE 1.03 05/13/2022 1459   CREATININE 0.98 10/30/2013 1118   CALCIUM 10.0 05/13/2022 1459   PROT 7.8 05/13/2022 1459   PROT 7.1 03/26/2020 1650   ALBUMIN 4.6 05/13/2022 1459   ALBUMIN 4.5 03/26/2020 1650   AST 14 05/13/2022 1459   ALT 12 05/13/2022 1459   ALKPHOS 98 05/13/2022 1459   BILITOT 0.6 05/13/2022 1459   BILITOT 0.5 03/26/2020 1650   GFRNONAA 59 (L) 05/04/2022 1158   GFRNONAA 62 10/30/2013 1118   GFRAA 71 06/28/2019 1120   GFRAA 72 10/30/2013 1118   Lab Results  Component Value Date   CHOL 189 06/28/2019   HDL 51 06/28/2019   LDLCALC 121 (H) 06/28/2019   TRIG 92 06/28/2019   CHOLHDL 3.7 06/28/2019   Lab Results  Component Value Date   HGBA1C 6.6 (H) 05/13/2022   No results found for: "VITAMINB12" Lab Results  Component Value Date   TSH 0.529 03/26/2020    01/23/15 MRI brain  - No acute findings in the brain. Mild  mucosal thickening in the ethmoid air cells and frontal sinuses.  - Addendum: On T2-weighted images and gradient-echo images,  there is a rounded 7 mm high signal intensity structure in the inferior left temporal bone. This is adjacent to the jugular bulb and may be part of a high riding jugular bulb or may be a opacified   air cell within an otherwise pneumatized medial inferior temporal bone. There is a tiny right mastoid tip effusion. The findings were discussed with the referring clinician.   06/01/22 MRI cervical spine 1.  Cervical spondylosis most significant at C4-5 with facet arthropathy and disc osteophyte causing moderate canal stenosis and severe bilateral neuroforaminal narrowing.  2.  C3-4 mild canal stenosis.   06/16/22 MRI thoracic spine - Mild multilevel thoracic degenerative disc disease.  - Normal thoracic spinal cord.  - No compression fracture or other acute abnormality.   03/18/22 MRI lumbar spine - Chronic facet arthropathy at L4-5 with degenerative spondylolisthesis. There is ankylosis of the facet joints. There is no spinal stenosis or foraminal stenosis.  - Otherwise mild degenerative changes.    ASSESSMENT AND PLAN  72 y.o. year old female here with:   Dx:  1. Neuropathy   2. Numbness and tingling     PLAN:  NUMBNESS / PAIN / WEAKNESS IN LEGS  - check neuropathy labs (could be related to diabetic neuropathy) - follow up with orthopedic / pain mgmt  Orders Placed This Encounter  Procedures   CBC with diff   CMP   Vitamin B12   A1c   TSH   SPEP with IFE   ANA w/Reflex   SSA, SSB   Vitamin B6   Vitamin B1   Hepatitis C antibody   Hepatitis B core antibody, total   Hepatitis B surface antigen   Hepatitis B surface antibody, qualitative   RPR   HIV   Return for pending test results, pending if symptoms worsen or fail to improve.    Suanne Marker, MD 09/11/2022, 12:01 PM Certified in Neurology, Neurophysiology and Neuroimaging  [facility] [address] [place], [place] [address] [phone]

## 2022-09-15 LAB — ENA+DNA/DS+ANTICH+CENTRO+FA...
Anti JO-1: <0.2 AI (ref 0.0–0.9)
Antiribosomal P Antibodies: <0.2 AI (ref 0.0–0.9)
Centromere Ab Screen: <0.2 AI (ref 0.0–0.9)
Chromatin Ab SerPl-aCnc: <0.2 AI (ref 0.0–0.9)
ENA RNP Ab: 0.2 AI (ref 0.0–0.9)
ENA SM Ab Ser-aCnc: <0.2 AI (ref 0.0–0.9)
ENA SSA (RO) Ab: 1.8 AI — ABNORMAL HIGH (ref 0.0–0.9)
ENA SSB (LA) Ab: <0.2 AI (ref 0.0–0.9)
Scleroderma (Scl-70) (ENA) Antibody, IgG: <0.2 AI (ref 0.0–0.9)
Smith/RNP Antibodies: <0.2 AI (ref 0.0–0.9)
Speckled Pattern: 1:160 {titer} — ABNORMAL HIGH
dsDNA Ab: 2 [IU]/mL (ref 0–9)

## 2022-09-15 LAB — ANA W/REFLEX: ANA Titer 1: POSITIVE — AB

## 2022-09-16 DIAGNOSIS — R1032 Left lower quadrant pain: Secondary | ICD-10-CM | POA: Diagnosis not present

## 2022-09-16 DIAGNOSIS — R1031 Right lower quadrant pain: Secondary | ICD-10-CM | POA: Diagnosis not present

## 2022-09-16 DIAGNOSIS — K5900 Constipation, unspecified: Secondary | ICD-10-CM | POA: Diagnosis not present

## 2022-09-16 DIAGNOSIS — R634 Abnormal weight loss: Secondary | ICD-10-CM | POA: Diagnosis not present

## 2022-09-17 ENCOUNTER — Other Ambulatory Visit: Payer: Self-pay | Admitting: Gastroenterology

## 2022-09-17 ENCOUNTER — Telehealth: Payer: Self-pay | Admitting: Anesthesiology

## 2022-09-17 NOTE — Telephone Encounter (Signed)
-----   Message from Suanne Marker sent at 09/16/2022  5:14 PM EDT ----- Positive ANA, elevated SSA. Consider rheumatology consult. Other labs ok. -VRP

## 2022-09-18 ENCOUNTER — Telehealth: Payer: Self-pay | Admitting: Emergency Medicine

## 2022-09-18 NOTE — Telephone Encounter (Signed)
Patient called and said she had just picked up her ALPRAZolam Prudy Feeler) 0.5 MG tablet from the pharmacy and thinks her son may have accidentally thrown in away. She said it was still in the bag from the pharmacy and that he likely threw it away when cleaning up. She said she has a Immunologist and it got crushed. Patient said the bottle was still full and would like to know what Dr. Alvy Bimler would like to do. Patient would like a call back at [phone].

## 2022-09-19 LAB — COMPREHENSIVE METABOLIC PANEL
ALT: 14 IU/L (ref 0–32)
AST: 19 IU/L (ref 0–40)
Albumin: 4.4 g/dL (ref 3.9–4.9)
Alkaline Phosphatase: 90 IU/L (ref 44–121)
BUN/Creatinine Ratio: 12 (ref 12–28)
BUN: 12 mg/dL (ref 8–27)
Bilirubin Total: 0.8 mg/dL (ref 0.0–1.2)
CO2: 19 mmol/L — ABNORMAL LOW (ref 20–29)
Calcium: 9.7 mg/dL (ref 8.7–10.3)
Chloride: 98 mmol/L (ref 96–106)
Creatinine, Ser: 1.02 mg/dL — ABNORMAL HIGH (ref 0.57–1.00)
Globulin, Total: 2.9 g/dL (ref 1.5–4.5)
Glucose: 101 mg/dL — ABNORMAL HIGH (ref 70–99)
Potassium: 4 mmol/L (ref 3.5–5.2)
Sodium: 134 mmol/L (ref 134–144)
Total Protein: 7.3 g/dL (ref 6.0–8.5)
eGFR: 59 mL/min/{1.73_m2} — ABNORMAL LOW (ref >59)

## 2022-09-19 LAB — CBC WITH DIFFERENTIAL/PLATELET
Basophils Absolute: 0 10*3/uL (ref 0.0–0.2)
Basos: 1 %
EOS (ABSOLUTE): 0.1 10*3/uL (ref 0.0–0.4)
Eos: 1 %
Hematocrit: 42.8 % (ref 34.0–46.6)
Hemoglobin: 14.2 g/dL (ref 11.1–15.9)
Immature Grans (Abs): 0 10*3/uL (ref 0.0–0.1)
Immature Granulocytes: 0 %
Lymphocytes Absolute: 2.5 10*3/uL (ref 0.7–3.1)
Lymphs: 31 %
MCH: 29.3 pg (ref 26.6–33.0)
MCHC: 33.2 g/dL (ref 31.5–35.7)
MCV: 88 fL (ref 79–97)
Monocytes Absolute: 0.7 10*3/uL (ref 0.1–0.9)
Monocytes: 8 %
Neutrophils Absolute: 5 10*3/uL (ref 1.4–7.0)
Neutrophils: 59 %
Platelets: 410 10*3/uL (ref 150–450)
RBC: 4.84 x10E6/uL (ref 3.77–5.28)
RDW: 13.2 % (ref 11.7–15.4)
WBC: 8.2 10*3/uL (ref 3.4–10.8)

## 2022-09-19 LAB — MULTIPLE MYELOMA PANEL, SERUM
Albumin SerPl Elph-Mcnc: 3.9 g/dL (ref 2.9–4.4)
Albumin/Glob SerPl: 1.2 (ref 0.7–1.7)
Alpha 1: 0.3 g/dL (ref 0.0–0.4)
Alpha2 Glob SerPl Elph-Mcnc: 1 g/dL (ref 0.4–1.0)
B-Globulin SerPl Elph-Mcnc: 1.1 g/dL (ref 0.7–1.3)
Gamma Glob SerPl Elph-Mcnc: 1 g/dL (ref 0.4–1.8)
Globulin, Total: 3.4 g/dL (ref 2.2–3.9)
IgA/Immunoglobulin A, Serum: 158 mg/dL (ref 87–352)
IgG (Immunoglobin G), Serum: 973 mg/dL (ref 586–1602)
IgM (Immunoglobulin M), Srm: 225 mg/dL — ABNORMAL HIGH (ref 26–217)

## 2022-09-19 LAB — HIV ANTIBODY (ROUTINE TESTING W REFLEX): HIV Screen 4th Generation wRfx: NONREACTIVE

## 2022-09-19 LAB — VITAMIN B12: Vitamin B-12: >2000 pg/mL — ABNORMAL HIGH (ref 232–1245)

## 2022-09-19 LAB — HEMOGLOBIN A1C
Est. average glucose Bld gHb Est-mCnc: 120 mg/dL
Hgb A1c MFr Bld: 5.8 % — ABNORMAL HIGH (ref 4.8–5.6)

## 2022-09-19 LAB — VITAMIN B6: Vitamin B6: 3.1 ug/L — ABNORMAL LOW (ref 3.4–65.2)

## 2022-09-19 LAB — TSH: TSH: 1.42 u[IU]/mL (ref 0.450–4.500)

## 2022-09-19 LAB — RPR: RPR Ser Ql: NONREACTIVE

## 2022-09-19 LAB — HEPATITIS B SURFACE ANTIGEN: Hepatitis B Surface Ag: NEGATIVE

## 2022-09-19 LAB — VITAMIN B1: Thiamine: 117.6 nmol/L (ref 66.5–200.0)

## 2022-09-19 LAB — SJOGREN'S SYNDROME ANTIBODS(SSA + SSB)
ENA SSA (RO) Ab: 1.7 AI — ABNORMAL HIGH (ref 0.0–0.9)
ENA SSB (LA) Ab: <0.2 AI (ref 0.0–0.9)

## 2022-09-19 LAB — HEPATITIS B SURFACE ANTIBODY,QUALITATIVE: Hep B Surface Ab, Qual: NONREACTIVE

## 2022-09-19 LAB — HEPATITIS B CORE ANTIBODY, TOTAL: Hep B Core Total Ab: NEGATIVE

## 2022-09-19 LAB — HEPATITIS C ANTIBODY: Hep C Virus Ab: NONREACTIVE

## 2022-09-20 ENCOUNTER — Other Ambulatory Visit: Payer: Self-pay | Admitting: Emergency Medicine

## 2022-09-20 DIAGNOSIS — F4323 Adjustment disorder with mixed anxiety and depressed mood: Secondary | ICD-10-CM

## 2022-09-20 DIAGNOSIS — F418 Other specified anxiety disorders: Secondary | ICD-10-CM

## 2022-09-20 MED ORDER — ALPRAZOLAM 0.5 MG PO TABS
0.5000 mg | ORAL_TABLET | Freq: Every day | ORAL | 1 refills | Status: DC | PRN
Start: 2022-09-20 — End: 2022-11-07

## 2022-09-20 NOTE — Telephone Encounter (Signed)
New prescription for alprazolam sent to pharmacy of record today.  Thanks.

## 2022-09-22 ENCOUNTER — Encounter: Payer: Self-pay | Admitting: Diagnostic Neuroimaging

## 2022-09-29 ENCOUNTER — Telehealth: Payer: Self-pay | Admitting: Diagnostic Neuroimaging

## 2022-09-29 NOTE — Telephone Encounter (Signed)
At 12:02 pt left vm asking if she should make an appointment to come in and discuss results or if this is something she will be called back to discuss her numbness which is now worse since last appointment, please call to advise.

## 2022-10-13 ENCOUNTER — Emergency Department (HOSPITAL_COMMUNITY): Payer: Medicare HMO

## 2022-10-13 ENCOUNTER — Encounter (HOSPITAL_COMMUNITY): Payer: Self-pay

## 2022-10-13 ENCOUNTER — Other Ambulatory Visit: Payer: Self-pay

## 2022-10-13 ENCOUNTER — Emergency Department (HOSPITAL_COMMUNITY)
Admission: EM | Admit: 2022-10-13 | Discharge: 2022-10-13 | Disposition: A | Discharge status: Home / Self Care | Payer: Medicare HMO | Attending: Emergency Medicine | Admitting: Emergency Medicine

## 2022-10-13 DIAGNOSIS — K5732 Diverticulitis of large intestine without perforation or abscess without bleeding: Secondary | ICD-10-CM | POA: Insufficient documentation

## 2022-10-13 DIAGNOSIS — K5730 Diverticulosis of large intestine without perforation or abscess without bleeding: Secondary | ICD-10-CM | POA: Diagnosis not present

## 2022-10-13 DIAGNOSIS — I499 Cardiac arrhythmia, unspecified: Secondary | ICD-10-CM | POA: Diagnosis not present

## 2022-10-13 DIAGNOSIS — Z9049 Acquired absence of other specified parts of digestive tract: Secondary | ICD-10-CM | POA: Insufficient documentation

## 2022-10-13 DIAGNOSIS — R6889 Other general symptoms and signs: Secondary | ICD-10-CM | POA: Diagnosis not present

## 2022-10-13 DIAGNOSIS — K5792 Diverticulitis of intestine, part unspecified, without perforation or abscess without bleeding: Secondary | ICD-10-CM

## 2022-10-13 DIAGNOSIS — R457 State of emotional shock and stress, unspecified: Secondary | ICD-10-CM | POA: Diagnosis not present

## 2022-10-13 DIAGNOSIS — Z743 Need for continuous supervision: Secondary | ICD-10-CM | POA: Diagnosis not present

## 2022-10-13 DIAGNOSIS — R109 Unspecified abdominal pain: Secondary | ICD-10-CM | POA: Diagnosis not present

## 2022-10-13 LAB — COMPREHENSIVE METABOLIC PANEL
ALT: 6 U/L (ref 0–44)
AST: 18 U/L (ref 15–41)
Albumin: 4 g/dL (ref 3.5–5.0)
Alkaline Phosphatase: 73 U/L (ref 38–126)
Anion gap: 12 (ref 5–15)
BUN: 7 mg/dL — ABNORMAL LOW (ref 8–23)
CO2: 21 mmol/L — ABNORMAL LOW (ref 22–32)
Calcium: 9.2 mg/dL (ref 8.9–10.3)
Chloride: 101 mmol/L (ref 98–111)
Creatinine, Ser: 0.78 mg/dL (ref 0.44–1.00)
GFR, Estimated: >60 mL/min (ref >60)
Glucose, Bld: 96 mg/dL (ref 70–99)
Potassium: 4.6 mmol/L (ref 3.5–5.1)
Sodium: 134 mmol/L — ABNORMAL LOW (ref 135–145)
Total Bilirubin: 1.6 mg/dL — ABNORMAL HIGH (ref 0.3–1.2)
Total Protein: 7.1 g/dL (ref 6.5–8.1)

## 2022-10-13 LAB — CBC WITH DIFFERENTIAL/PLATELET
Abs Immature Granulocytes: 0.02 10*3/uL (ref 0.00–0.07)
Basophils Absolute: 0 10*3/uL (ref 0.0–0.1)
Basophils Relative: 0 %
Eosinophils Absolute: 0 10*3/uL (ref 0.0–0.5)
Eosinophils Relative: 0 %
HCT: 42.2 % (ref 36.0–46.0)
Hemoglobin: 14 g/dL (ref 12.0–15.0)
Immature Granulocytes: 0 %
Lymphocytes Relative: 25 %
Lymphs Abs: 2.8 10*3/uL (ref 0.7–4.0)
MCH: 29.2 pg (ref 26.0–34.0)
MCHC: 33.2 g/dL (ref 30.0–36.0)
MCV: 87.9 fL (ref 80.0–100.0)
Monocytes Absolute: 0.8 10*3/uL (ref 0.1–1.0)
Monocytes Relative: 7 %
Neutro Abs: 7.4 10*3/uL (ref 1.7–7.7)
Neutrophils Relative %: 68 %
Platelets: 412 10*3/uL — ABNORMAL HIGH (ref 150–400)
RBC: 4.8 MIL/uL (ref 3.87–5.11)
RDW: 14 % (ref 11.5–15.5)
WBC: 11 10*3/uL — ABNORMAL HIGH (ref 4.0–10.5)
nRBC: 0 % (ref 0.0–0.2)

## 2022-10-13 LAB — LIPASE, BLOOD: Lipase: 33 U/L (ref 11–51)

## 2022-10-13 MED ORDER — AMOXICILLIN-POT CLAVULANATE 875-125 MG PO TABS
1.0000 | ORAL_TABLET | Freq: Once | ORAL | Status: AC
  Administered 2022-10-13: 1 via ORAL
  Filled 2022-10-13: qty 1

## 2022-10-13 MED ORDER — AMOXICILLIN-POT CLAVULANATE 875-125 MG PO TABS: 1.0000 | ORAL_TABLET | Freq: Two times a day (BID) | ORAL | 0 refills | Status: DC

## 2022-10-13 MED ORDER — IOHEXOL 300 MG/ML  SOLN
100.0000 mL | Freq: Once | INTRAMUSCULAR | Status: AC | PRN
  Administered 2022-10-13: 100 mL via INTRAVENOUS

## 2022-10-13 NOTE — ED Triage Notes (Addendum)
Pt is coming from home. Complaining of  right sided abd pain, distention, nausea, and diarrhea. States s/s have been going for apprx 1 month. Pt had a recent endoscopy and polyps were discovered.

## 2022-10-13 NOTE — Discharge Instructions (Addendum)
The workup in the emergency room is concerning for diverticulitis.  Please take the antibiotics that are prescribed.  Please return to the ER if your symptoms worsen; you have increased pain, fevers, chills, inability to keep any medications down, confusion. Otherwise see the outpatient doctor as requested.

## 2022-10-13 NOTE — ED Provider Triage Note (Signed)
Emergency Medicine Provider Triage Evaluation Note  Jasmine Long , a 72 y.o. female  was evaluated in triage.  Pt complains of abdominal pain x 1 month but got worse today. Pain is mostly on the RUQ and RLQ, with increased distention on the right side. States she has been having diarrhea in the last few day with mucous in her stools. Last BM was today. Endorses nausea w/o vomiting. Denies fever.   Review of Systems  Positive: As above Negative: As above  Physical Exam  BP (!) 160/70 (BP Location: Right Arm)   Pulse (!) 116   Temp 98.3 F (36.8 C) (Oral)   Resp 18   Ht 5\' 9"  (1.753 m)   Wt 79.4 kg   SpO2 92%   BMI 25.84 kg/m  Gen:   Awake, no distress   Resp:  Normal effort  MSK:   Moves extremities without difficulty  Other:  TTP to RUQ and RLQ  Medical Decision Making  Medically screening exam initiated at 3:25 PM.  Appropriate orders placed.  [name] was informed that the remainder of the evaluation will be completed by another provider, this initial triage assessment does not replace that evaluation, and the importance of remaining in the ED until their evaluation is complete.    Jeanelle Malling, [place] 10/13/22 1527

## 2022-10-13 NOTE — ED Provider Notes (Signed)
[facility] AT [facility] Provider Note   CSN: [id number] Arrival date & time: 10/13/22  1403     History  Chief Complaint  Patient presents with   Abdominal Pain    Jasmine Long is a 72 y.o. female.  HPI    72 year old female comes in with chief complaint of right-sided abdominal pain.  Patient states that over the last 4 weeks, she has been having off-and-on diarrhea.  Diarrhea is described as loose bowel movement, with mucus in it.  She does not have daily diarrhea, and at no point is she having more than 3 or 4 bowel movements a day.  Today after 1 of those episodes of loose bowel movements, she started having right-sided abdominal discomfort.  She feels that the right side of the abdomen is hard.  She denies any blood in the stools, fevers, chills.  Patient is status post cholecystectomy.  No history of hernia.  Home Medications Prior to Admission medications   Medication Sig Start Date End Date Taking? Authorizing Provider  amoxicillin-clavulanate (AUGMENTIN) 875-125 MG tablet Take 1 tablet by mouth every 12 (twelve) hours. 10/13/22  Yes Derwood Kaplan, MD  ALPRAZolam Prudy Feeler) 0.5 MG tablet Take 1 tablet (0.5 mg total) by mouth daily as needed for anxiety. 09/20/22   [name], MD  atorvastatin (LIPITOR) 20 MG tablet Take 1 tablet by mouth once daily 08/18/22   [name], MD  buPROPion (WELLBUTRIN XL) 300 MG 24 hr tablet Take 1 tablet by mouth once daily 08/18/22   [name], MD  busPIRone (BUSPAR) 7.5 MG tablet Take 1 tablet (7.5 mg total) by mouth 2 (two) times daily. 07/16/22   [name], MD  escitalopram (LEXAPRO) 10 MG tablet Take 1 tablet (10 mg total) by mouth daily. 12/25/21 12/20/22  [name], MD  levothyroxine (SYNTHROID) 125 MCG tablet TAKE 1 TABLET BY MOUTH ONCE DAILY BEFORE BREAKFAST 04/14/22   [name], MD  metFORMIN (GLUCOPHAGE) 1000 MG tablet TAKE 1 TABLET BY MOUTH  TWICE DAILY WITH A MEAL 08/18/22   [name], MD  metoprolol succinate (TOPROL-XL) 25 MG 24 hr tablet Take 1 tablet by mouth once daily 05/27/22   [name], MD  pantoprazole (PROTONIX) 40 MG tablet Take 1 tablet twice a day for 2 weeks and then daily after that 08/08/22   [name], MD  valsartan-hydrochlorothiazide (DIOVAN-HCT) 160-12.5 MG tablet Take 1 tablet by mouth once daily 05/27/22   [name], MD      Allergies    Patient has no known allergies.    Review of Systems   Review of Systems  All other systems reviewed and are negative.   Physical Exam Updated Vital Signs BP (!) 153/90   Pulse 80   Temp 97.6 F (36.4 C)   Resp 18   Ht 5\' 9"  (1.753 m)   Wt 79.4 kg   SpO2 93%   BMI 25.84 kg/m  Physical Exam Vitals and nursing note reviewed.  Constitutional:      Appearance: She is well-developed.  HENT:     Head: Atraumatic.  Cardiovascular:     Rate and Rhythm: Normal rate.  Pulmonary:     Effort: Pulmonary effort is normal.  Abdominal:     General: Abdomen is flat. There is no distension.     Tenderness: There is abdominal tenderness in the right upper quadrant, right lower quadrant and periumbilical area.  Musculoskeletal:  Cervical back: Normal range of motion and neck supple.  Skin:    General: Skin is warm and dry.  Neurological:     Mental Status: She is alert and oriented to person, place, and time.     ED Results / Procedures / Treatments   Labs (all labs ordered are listed, but only abnormal results are displayed) Labs Reviewed  COMPREHENSIVE METABOLIC PANEL - Abnormal; Notable for the following components:      Result Value   Sodium 134 (*)    CO2 21 (*)    BUN 7 (*)    Total Bilirubin 1.6 (*)    All other components within normal limits  CBC WITH DIFFERENTIAL/PLATELET - Abnormal; Notable for the following components:   WBC 11.0 (*)    Platelets 412 (*)    All other components within normal limits   LIPASE, BLOOD  URINALYSIS, ROUTINE W REFLEX MICROSCOPIC    EKG None  Radiology CT ABDOMEN PELVIS W CONTRAST  Result Date: 10/13/2022 CLINICAL DATA:  Right-sided abdominal pain and diarrhea EXAM: CT ABDOMEN AND PELVIS WITH CONTRAST TECHNIQUE: Multidetector CT imaging of the abdomen and pelvis was performed using the standard protocol following bolus administration of intravenous contrast. RADIATION DOSE REDUCTION: This exam was performed according to the departmental dose-optimization program which includes automated exposure control, adjustment of the mA and/or kV according to patient size and/or use of iterative reconstruction technique. CONTRAST:  OMNIPAQUE IOHEXOL 300 MG/ML  SOLN COMPARISON:  CT abdomen and pelvis dated July 17, 2022 FINDINGS: Lower chest: Trace pericardial effusion. Aortic valve calcifications. Lipomatous hypertrophy of the intra-atrial septum. Mild mitral annular calcifications. Hepatobiliary: No focal liver abnormality is seen. Status post cholecystectomy. No biliary dilatation. Pancreas: Unremarkable. No pancreatic ductal dilatation or surrounding inflammatory changes. Spleen: Normal in size without focal abnormality. Adrenals/Urinary Tract: Bilateral adrenal glands are unremarkable. No hydronephrosis or nephrolithiasis. Bladder is unremarkable. Stomach/Bowel: Stomach is within normal limits. Appendix appears normal. Severe diverticulosis. Mild focal inflammatory change seen adjacent to the wall of the ascending colon surrounding a small diverticula. Severe diverticulosis of the sigmoid colon. No bowel wall thickening or evidence of obstruction. Vascular/Lymphatic: Aortic atherosclerosis. No enlarged abdominal or pelvic lymph nodes. Reproductive: Uterus and bilateral adnexa are unremarkable. Other: No abdominal wall hernia or abnormality. No abdominopelvic ascites. Musculoskeletal: No aggressive appearing osseous lesions. IMPRESSION: 1. Mild focal inflammatory change seen  adjacent to the wall of the ascending colon surrounding a small diverticula, likely due to mild right-sided diverticulitis. 2. Aortic valve calcifications, findings can be seen in the setting of aortic stenosis. Correlate with echocardiography. 3. Aortic Atherosclerosis (ICD10-I70.0). Electronically Signed   By: Allegra Lai M.D.   On: 10/13/2022 18:41    Procedures Procedures    Medications Ordered in ED Medications  amoxicillin-clavulanate (AUGMENTIN) 875-125 MG per tablet 1 tablet (has no administration in time range)  iohexol (OMNIPAQUE) 300 MG/ML solution 100 mL (100 mLs Intravenous Contrast Given 10/13/22 1729)    ED Course/ Medical Decision Making/ A&P                                 Medical Decision Making Risk Prescription drug management.   72 year old female comes in with chief complaint of right-sided abdominal pain.  She is status post cholecystectomy.  She indicates that she has large polyps in her colon and also over the last few days she has been having intermittent episodes of loose bowel movements with green  mucus in it.  On exam, she has right-sided tenderness.  Differential diagnosis considered for this patient includes acute appendicitis, kidney stones, hernia, diverticulitis, ileus, severe constipation.  CT scan of the abdomen has been ordered along with basic labs. I have reviewed patient's previous scope.  Patient was found to have multiple polyps at that time.  She is seen by Dr. Elnoria Howard.  Reassessment: I have independently interpreted patient's CT scan.  There is no evidence of obstruction.  Patient does not have any evidence of hernia. Radiology read indicates that patient likely has diverticulitis, and specifically right-sided diverticulitis.  This is incongruent with the physical exam finding of right-sided tenderness.  Will start her on Augmentin.  Final Clinical Impression(s) / ED Diagnoses Final diagnoses:  Acute diverticulitis    Rx / DC  Orders ED Discharge Orders          Ordered    amoxicillin-clavulanate (AUGMENTIN) 875-125 MG tablet  Every 12 hours        10/13/22 2024              Derwood Kaplan, MD 10/13/22 2028

## 2022-10-14 ENCOUNTER — Telehealth: Payer: Self-pay | Admitting: Emergency Medicine

## 2022-10-14 NOTE — Telephone Encounter (Signed)
CT scan shows diverticulitis and she was started on treatment for it.  No other concerns.

## 2022-10-14 NOTE — Telephone Encounter (Signed)
Pt called wanting Dr Alvy Bimler to take a look at her results from her CT scan and get back with her about her results. Please advise.

## 2022-10-15 NOTE — Telephone Encounter (Signed)
Called patient and informed her of provider recommendation

## 2022-10-15 NOTE — Telephone Encounter (Signed)
With acute diverticulitis the only safe pain medication she should take is Tylenol.  Thanks.

## 2022-10-23 ENCOUNTER — Encounter (HOSPITAL_COMMUNITY): Payer: Self-pay | Admitting: Gastroenterology

## 2022-10-23 NOTE — Progress Notes (Signed)
Attempted to obtain medical history for pre op call via telephone, unable to reach at this time. HIPAA compliant voicemail message left requesting return call to pre surgical testing department.

## 2022-10-23 NOTE — Progress Notes (Signed)
Pre op call eval Name: Jasmine Long  PCP-Miguel Sagardia MD Cardiologist-n/a  EKG-05/10/17 Echo-05/25/17 Cath-n/a Stress-n/a ICD/PM-n/a Blood thinner-n/a GLP-1-n/a  Hx:HTN, heart murmur, DM, Anxiety, numbness feet/legs. Patient has been having some numbness which led to some falls, saw Neuro for this issue last visit 8/16, will be seeing again 10/2. She stated she was still having numbness but no recent falls, using cane to get around. Has also dealt with a bout of diverticulitis and was given some antibiotics for that, finished on 9/25. Asked if any recent sickness, she said has a sinus infection 24/7 all year round, but is feeling good now especially since taken the antibiotics for the other issue. Asked her about her murmur she said saw cardiologist while ago but told her she would have it forever and could be managed by pcp (last visit 07/16/22). Anesthesia Review: Yes

## 2022-10-28 ENCOUNTER — Ambulatory Visit: Payer: Medicare HMO | Admitting: Diagnostic Neuroimaging

## 2022-10-28 ENCOUNTER — Encounter: Payer: Self-pay | Admitting: Diagnostic Neuroimaging

## 2022-10-28 VITALS — BP 122/56 | HR 82 | Ht 69.0 in

## 2022-10-28 DIAGNOSIS — R200 Anesthesia of skin: Secondary | ICD-10-CM | POA: Diagnosis not present

## 2022-10-28 DIAGNOSIS — M79605 Pain in left leg: Secondary | ICD-10-CM | POA: Diagnosis not present

## 2022-10-28 DIAGNOSIS — M79604 Pain in right leg: Secondary | ICD-10-CM

## 2022-10-28 DIAGNOSIS — R202 Paresthesia of skin: Secondary | ICD-10-CM

## 2022-10-28 NOTE — Patient Instructions (Signed)
  NUMBNESS / PAIN / WEAKNESS IN LEGS (history of injury in 2014; also worsened following PT in Feb 2024) - neuropathy labs --> borderline low B6 (start b-complex vitamin) and mild abnl ANA, SSA elevation (refer to rheumatology) - follow up with PCP, spine clinic / pain mgmt

## 2022-10-28 NOTE — Progress Notes (Signed)
[facility]  PATIENT: Jasmine Long DOB: 07-17-1950  REFERRING CLINICIAN: [name], * HISTORY FROM: patient REASON FOR VISIT: follow up   HISTORICAL  CHIEF COMPLAINT:  Chief Complaint  Patient presents with   Follow-up    Pt with, rm 7 she is here to follow up from test that were completed. Would like to discuss the numbness concerns. ? If needs a EMG/NCV completed. She last one completed 11/2020 in the Left LE. Currently she has numbness in both feet below knees down.    HISTORY OF PRESENT ILLNESS:   UPDATE (10/28/22, VRP): Since last visit, doing about the same. Symptoms are pain and numbness in legs and feet. Some mild lab abnl. Not been to PT lately.   PRIOR HPI (09/11/22, VRP): 72 year old female here for evaluation of lower extremity numbness.  Around 2014 patient fell down at work, on her back, on concrete surface.  She had severe pain in her left shoulder, entire spine and left leg.  She was evaluated and treated through [employer].  She had some treatments including pain management, physical therapy and injections.  Around February 2024 patient was referred to another physical therapist for evaluation.  She had several rounds of therapy but seem to worsen following the sessions.  She had increasing pain in her lower back and increasing numbness in lower extremities.  She had additional test including MRI lumbar spine, MRI cervical thoracic spine, with follow-up in neurosurgery and orthopedic surgery clinics.  Surgical treatment was not recommended.  Conservative management was continued.  Due to ongoing symptoms patient was referred to neurology clinic for further evaluation.   REVIEW OF SYSTEMS: Full 14 system review of systems performed and negative with exception of: as per HPI.  ALLERGIES: No Known Allergies  HOME MEDICATIONS: Outpatient Medications Prior to Visit  Medication Sig Dispense Refill   ALPRAZolam (XANAX)  0.5 MG tablet Take 1 tablet (0.5 mg total) by mouth daily as needed for anxiety. 30 tablet 1   amoxicillin-clavulanate (AUGMENTIN) 875-125 MG tablet Take 1 tablet by mouth every 12 (twelve) hours. 14 tablet 0   atorvastatin (LIPITOR) 20 MG tablet Take 1 tablet by mouth once daily 90 tablet 1   buPROPion (WELLBUTRIN XL) 300 MG 24 hr tablet Take 1 tablet by mouth once daily 90 tablet 1   busPIRone (BUSPAR) 7.5 MG tablet Take 1 tablet (7.5 mg total) by mouth 2 (two) times daily. 60 tablet 1   escitalopram (LEXAPRO) 10 MG tablet Take 1 tablet (10 mg total) by mouth daily. 90 tablet 3   gabapentin (NEURONTIN) 300 MG capsule Take 300 mg by mouth as needed (numbness).     levothyroxine (SYNTHROID) 125 MCG tablet TAKE 1 TABLET BY MOUTH ONCE DAILY BEFORE BREAKFAST 90 tablet 0   metFORMIN (GLUCOPHAGE) 1000 MG tablet TAKE 1 TABLET BY MOUTH TWICE DAILY WITH A MEAL 180 tablet 1   metoprolol succinate (TOPROL-XL) 25 MG 24 hr tablet Take 1 tablet by mouth once daily 90 tablet 3   pantoprazole (PROTONIX) 40 MG tablet Take 1 tablet twice a day for 2 weeks and then daily after that 90 tablet 3   valsartan-hydrochlorothiazide (DIOVAN-HCT) 160-12.5 MG tablet Take 1 tablet by mouth once daily 90 tablet 3   No facility-administered medications prior to visit.    PAST MEDICAL HISTORY: Past Medical History:  Diagnosis Date   Allergy    Anxiety    Depression    Heart murmur    Hypertension  Thyroid disease    Ulcer     PAST SURGICAL HISTORY: Past Surgical History:  Procedure Laterality Date   BREAST SURGERY     CESAREAN SECTION     CHOLECYSTECTOMY     NASAL SEPTUM SURGERY      FAMILY HISTORY: Family History  Problem Relation Age of Onset   Lung cancer Mother    Dementia Father    Atrial fibrillation Father     SOCIAL HISTORY: Social History   Socioeconomic History   Marital status: Divorced    Spouse name: Not on file   Number of children: 2   Years of education: Not on file   Highest  education level: Not on file  Occupational History   Occupation: Unemployed  Tobacco Use   Smoking status: Every Day    Current packs/day: 0.00    Average packs/day: 1.5 packs/day for 40.0 years (60.0 ttl pk-yrs)    Types: Cigarettes    Start date: 01/20/1972    Last attempt to quit: 01/20/2012    Years since quitting: 10.7   Smokeless tobacco: Never   Tobacco comments:    Pt doing E-cigarette as of 01/20/12  Vaping Use   Vaping status: Former  Substance and Sexual Activity   Alcohol use: No   Drug use: No   Sexual activity: Never  Other Topics Concern   Not on file  Social History Narrative   Patient on worker's compensation for an injury sustained on the job 5 years ago.      Right handed and Left Handed       Lives in a two story home       One son deceased.    Social Determinants of Health   Financial Resource Strain: Low Risk  (09/03/2022)   Overall Financial Resource Strain (CARDIA)    Difficulty of Paying Living Expenses: Not hard at all  Food Insecurity: No Food Insecurity (09/03/2022)   Hunger Vital Sign    Worried About Running Out of Food in the Last Year: Never true    Ran Out of Food in the Last Year: Never true  Transportation Needs: No Transportation Needs (09/03/2022)   PRAPARE - Administrator, Civil Service (Medical): No    Lack of Transportation (Non-Medical): No  Physical Activity: Inactive (09/03/2022)   Exercise Vital Sign    Days of Exercise per Week: 0 days    Minutes of Exercise per Session: 0 min  Stress: No Stress Concern Present (09/03/2022)   [employer] of Occupational Health - Occupational Stress Questionnaire    Feeling of Stress : Not at all  Social Connections: Socially Isolated (09/03/2022)   Social Connection and Isolation Panel [NHANES]    Frequency of Communication with Friends and Family: More than three times a week    Frequency of Social Gatherings with Friends and Family: More than three times a week    Attends  Religious Services: Never    Database administrator or Organizations: No    Attends Banker Meetings: Never    Marital Status: Divorced  Catering manager Violence: Not At Risk (09/03/2022)   Humiliation, Afraid, Rape, and Kick questionnaire    Fear of Current or Ex-Partner: No    Emotionally Abused: No    Physically Abused: No    Sexually Abused: No     PHYSICAL EXAM  GENERAL EXAM/CONSTITUTIONAL: Vitals:  Vitals:   10/28/22 0948  BP: (!) 122/56  Pulse: 82  Height: 5\' 9"  (1.753  m)   Body mass index is 25.84 kg/m. Wt Readings from Last 3 Encounters:  10/13/22 175 lb (79.4 kg)  07/16/22 186 lb 4 oz (84.5 kg)  05/13/22 214 lb 2 oz (97.1 kg)   Patient is in no distress; well developed, nourished and groomed; neck is supple  CARDIOVASCULAR: Examination of carotid arteries is normal; no carotid bruits Regular rate and rhythm, no murmurs Examination of peripheral vascular system by observation and palpation is normal  EYES: Ophthalmoscopic exam of optic discs and posterior segments is normal; no papilledema or hemorrhages No results found.  MUSCULOSKELETAL: Gait, strength, tone, movements noted in Neurologic exam below  NEUROLOGIC: MENTAL STATUS:      No data to display         awake, alert, oriented to person, place and time recent and remote memory intact normal attention and concentration language fluent, comprehension intact, naming intact fund of knowledge appropriate  CRANIAL NERVE:  2nd - no papilledema on fundoscopic exam 2nd, 3rd, 4th, 6th - pupils equal and reactive to light, visual fields full to confrontation, extraocular muscles intact, no nystagmus 5th - facial sensation symmetric 7th - facial strength symmetric 8th - hearing intact 9th - palate elevates symmetrically, uvula midline 11th - shoulder shrug symmetric 12th - tongue protrusion midline  MOTOR:  normal bulk and tone, full strength in the BUE, BLE; EXCEPT RLE (HF 3, KE 4,  KF 3, DF 3) AND LLE (HF 4, KE / KF 4, DF 4+)  SENSORY:  normal and symmetric to light touch, temperature, vibration; DECR IN RIGHT LOWER EXT; SLIGHTLY DECR IN LEFT LEG  COORDINATION:  finger-nose-finger, fine finger movements normal  REFLEXES:  deep tendon reflexes TRACE and symmetric  GAIT/STATION:  IN WHEELCHAIR     DIAGNOSTIC DATA (LABS, IMAGING, TESTING) - I reviewed patient records, labs, notes, testing and imaging myself where available.  Lab Results  Component Value Date   WBC 11.0 (H) 10/13/2022   HGB 14.0 10/13/2022   HCT 42.2 10/13/2022   MCV 87.9 10/13/2022   PLT 412 (H) 10/13/2022      Component Value Date/Time   NA 134 (L) 10/13/2022 1543   NA 134 09/11/2022 1259   K 4.6 10/13/2022 1543   CL 101 10/13/2022 1543   CO2 21 (L) 10/13/2022 1543   GLUCOSE 96 10/13/2022 1543   BUN 7 (L) 10/13/2022 1543   BUN 12 09/11/2022 1259   CREATININE 0.78 10/13/2022 1543   CREATININE 0.98 10/30/2013 1118   CALCIUM 9.2 10/13/2022 1543   PROT 7.1 10/13/2022 1543   PROT 7.3 09/11/2022 1259   ALBUMIN 4.0 10/13/2022 1543   ALBUMIN 4.4 09/11/2022 1259   AST 18 10/13/2022 1543   ALT 6 10/13/2022 1543   ALKPHOS 73 10/13/2022 1543   BILITOT 1.6 (H) 10/13/2022 1543   BILITOT 0.8 09/11/2022 1259   GFRNONAA >60 10/13/2022 1543   GFRNONAA 62 10/30/2013 1118   GFRAA 71 06/28/2019 1120   GFRAA 72 10/30/2013 1118   Lab Results  Component Value Date   CHOL 189 06/28/2019   HDL 51 06/28/2019   LDLCALC 121 (H) 06/28/2019   TRIG 92 06/28/2019   CHOLHDL 3.7 06/28/2019   Lab Results  Component Value Date   HGBA1C 5.8 (H) 09/11/2022   Lab Results  Component Value Date   VITAMINB12 >2000 (H) 09/11/2022   Lab Results  Component Value Date   TSH 1.420 09/11/2022   Component Ref Range & Units 1 mo ago  ENA SSA (RO) Ab  0.0 - 0.9 AI 1.7 High   ENA SSB (LA) Ab 0.0 - 0.9 AI <0.2   Component Ref Range & Units 1 mo ago  Vitamin B6 3.4 - 65.2 ug/L 3.1 Low     12/05/20  EMG/NCS - This is a normal study of the left lower extremity. In particular, there is no evidence of a lumbosacral radiculopathy or sensorimotor polyneuropathy.   01/23/15 MRI brain  - No acute findings in the brain. Mild mucosal thickening in the ethmoid air cells and frontal sinuses.  - Addendum: On T2-weighted images and gradient-echo images, there is a rounded 7 mm high signal intensity structure in the inferior left temporal bone. This is adjacent to the jugular bulb and may be part of a high riding jugular bulb or may be a opacified   air cell within an otherwise pneumatized medial inferior temporal bone. There is a tiny right mastoid tip effusion. The findings were discussed with the referring clinician.   06/01/22 MRI cervical spine 1.  Cervical spondylosis most significant at C4-5 with facet arthropathy and disc osteophyte causing moderate canal stenosis and severe bilateral neuroforaminal narrowing.  2.  C3-4 mild canal stenosis.   06/16/22 MRI thoracic spine - Mild multilevel thoracic degenerative disc disease.  - Normal thoracic spinal cord.  - No compression fracture or other acute abnormality.   03/18/22 MRI lumbar spine - Chronic facet arthropathy at L4-5 with degenerative spondylolisthesis. There is ankylosis of the facet joints. There is no spinal stenosis or foraminal stenosis.  - Otherwise mild degenerative changes.    ASSESSMENT AND PLAN  72 y.o. year old female here with:   Dx:  1. Pain in both lower extremities   2. Numbness and tingling of both feet      PLAN:  NUMBNESS / PAIN / WEAKNESS IN LEGS (history of injury in 2014; also worsened following PT in Feb 2024) - neuropathy labs --> borderline low B6 (start b-complex vitamin) and mild abnl ANA, SSA elevation (refer to rheumatology) - follow up with PCP, spine clinic / pain mgmt  Orders Placed This Encounter  Procedures   Ambulatory referral to Rheumatology   Return for return to PCP, pending if symptoms  worsen or fail to improve.    Suanne Marker, MD 10/28/2022, 10:23 AM Certified in Neurology, Neurophysiology and Neuroimaging  [facility] [address], [address] [place], [place] [address] [phone]

## 2022-10-30 ENCOUNTER — Ambulatory Visit (HOSPITAL_COMMUNITY): Payer: Medicare HMO | Admitting: Certified Registered Nurse Anesthetist

## 2022-10-30 ENCOUNTER — Encounter (HOSPITAL_COMMUNITY): Payer: Self-pay | Admitting: Gastroenterology

## 2022-10-30 ENCOUNTER — Encounter (HOSPITAL_COMMUNITY)
Admission: RE | Disposition: A | Discharge status: Home / Self Care | Payer: Self-pay | Source: Ambulatory Visit | Attending: Gastroenterology

## 2022-10-30 ENCOUNTER — Ambulatory Visit (HOSPITAL_COMMUNITY)
Admission: RE | Admit: 2022-10-30 | Discharge: 2022-10-30 | Disposition: A | Discharge status: Home / Self Care | Payer: Medicare HMO | Source: Ambulatory Visit | Attending: Gastroenterology | Admitting: Gastroenterology

## 2022-10-30 ENCOUNTER — Other Ambulatory Visit: Payer: Self-pay

## 2022-10-30 DIAGNOSIS — C180 Malignant neoplasm of cecum: Secondary | ICD-10-CM

## 2022-10-30 DIAGNOSIS — F17200 Nicotine dependence, unspecified, uncomplicated: Secondary | ICD-10-CM | POA: Diagnosis not present

## 2022-10-30 DIAGNOSIS — E039 Hypothyroidism, unspecified: Secondary | ICD-10-CM | POA: Diagnosis not present

## 2022-10-30 DIAGNOSIS — Z801 Family history of malignant neoplasm of trachea, bronchus and lung: Secondary | ICD-10-CM | POA: Insufficient documentation

## 2022-10-30 DIAGNOSIS — D126 Benign neoplasm of colon, unspecified: Secondary | ICD-10-CM

## 2022-10-30 DIAGNOSIS — Z860100 Personal history of colon polyps, unspecified: Secondary | ICD-10-CM | POA: Diagnosis not present

## 2022-10-30 DIAGNOSIS — I10 Essential (primary) hypertension: Secondary | ICD-10-CM

## 2022-10-30 DIAGNOSIS — D122 Benign neoplasm of ascending colon: Secondary | ICD-10-CM | POA: Diagnosis not present

## 2022-10-30 DIAGNOSIS — K5730 Diverticulosis of large intestine without perforation or abscess without bleeding: Secondary | ICD-10-CM | POA: Insufficient documentation

## 2022-10-30 DIAGNOSIS — F17210 Nicotine dependence, cigarettes, uncomplicated: Secondary | ICD-10-CM | POA: Diagnosis not present

## 2022-10-30 DIAGNOSIS — K635 Polyp of colon: Secondary | ICD-10-CM | POA: Diagnosis not present

## 2022-10-30 HISTORY — PX: COLONOSCOPY WITH PROPOFOL: SHX5780

## 2022-10-30 HISTORY — PX: SUBMUCOSAL LIFTING INJECTION: SHX6855

## 2022-10-30 HISTORY — PX: POLYPECTOMY: SHX5525

## 2022-10-30 HISTORY — PX: HEMOSTASIS CLIP PLACEMENT: SHX6857

## 2022-10-30 HISTORY — PX: ENDOSCOPIC MUCOSAL RESECTION: SHX6839

## 2022-10-30 LAB — GLUCOSE, CAPILLARY: Glucose-Capillary: 96 mg/dL (ref 70–99)

## 2022-10-30 SURGERY — COLONOSCOPY WITH PROPOFOL
Anesthesia: Monitor Anesthesia Care

## 2022-10-30 MED ORDER — SODIUM CHLORIDE 0.9 % IV SOLN: INTRAVENOUS | Status: DC

## 2022-10-30 MED ORDER — LACTATED RINGERS IV SOLN
INTRAVENOUS | Status: AC | PRN
Start: 2022-10-30 — End: 2022-10-30
  Administered 2022-10-30: 1000 mL via INTRAVENOUS

## 2022-10-30 MED ORDER — PROPOFOL 500 MG/50ML IV EMUL
INTRAVENOUS | Status: DC | PRN
  Administered 2022-10-30: 50 ug/kg/min via INTRAVENOUS
  Administered 2022-10-30: 40 mg via INTRAVENOUS

## 2022-10-30 SURGICAL SUPPLY — 25 items
BLOCK BITE 60FR ADLT L/F BLUE (MISCELLANEOUS) ×3 IMPLANT
ELECT REM PT RETURN 9FT ADLT (ELECTROSURGICAL)
ELECTRODE REM PT RTRN 9FT ADLT (ELECTROSURGICAL) IMPLANT
FCP BXJMBJMB 240X2.8X (CUTTING FORCEPS)
FLOOR PAD 36X40 (MISCELLANEOUS) ×2
FORCEP RJ3 GP 1.8X160 W-NEEDLE (CUTTING FORCEPS) IMPLANT
FORCEPS BIOP RAD 4 LRG CAP 4 (CUTTING FORCEPS) IMPLANT
FORCEPS BIOP RJ4 240 W/NDL (CUTTING FORCEPS)
FORCEPS BXJMBJMB 240X2.8X (CUTTING FORCEPS) IMPLANT
INJECTOR/SNARE I SNARE (MISCELLANEOUS) IMPLANT
LUBRICANT JELLY 4.5OZ STERILE (MISCELLANEOUS) IMPLANT
MANIFOLD NEPTUNE II (INSTRUMENTS) IMPLANT
NDL SCLEROTHERAPY 25GX240 (NEEDLE) IMPLANT
NEEDLE SCLEROTHERAPY 25GX240 (NEEDLE)
PAD FLOOR 36X40 (MISCELLANEOUS) ×3 IMPLANT
PROBE APC STR FIRE (PROBE) IMPLANT
PROBE INJECTION GOLD (MISCELLANEOUS)
PROBE INJECTION GOLD 7FR (MISCELLANEOUS) IMPLANT
SNARE ROTATE MED OVAL 20MM (MISCELLANEOUS) IMPLANT
SNARE SHORT THROW 13M SML OVAL (MISCELLANEOUS) IMPLANT
SYR 50ML LL SCALE MARK (SYRINGE) IMPLANT
TRAP SPECIMEN MUCOUS 40CC (MISCELLANEOUS) IMPLANT
TUBING ENDO SMARTCAP PENTAX (MISCELLANEOUS) ×6 IMPLANT
TUBING IRRIGATION ENDOGATOR (MISCELLANEOUS) ×3 IMPLANT
WATER STERILE IRR 1000ML POUR (IV SOLUTION) IMPLANT

## 2022-10-30 NOTE — Anesthesia Preprocedure Evaluation (Signed)
Anesthesia Evaluation  Patient identified by MRN, date of birth, ID band Patient awake    Reviewed: Allergy & Precautions, NPO status , Patient's Chart, lab work & pertinent test results  Airway Mallampati: II  TM Distance: >3 FB Neck ROM: Full    Dental no notable dental hx. (+) Poor Dentition   Pulmonary neg pulmonary ROS, Current Smoker    + decreased breath sounds      Cardiovascular hypertension, Pt. on medications and Pt. on home beta blockers  Rhythm:Regular Rate:Normal     Neuro/Psych   Anxiety Depression    negative neurological ROS     GI/Hepatic Neg liver ROS,GERD  Medicated,,  Endo/Other  diabetes, Type 2, Oral Hypoglycemic AgentsHypothyroidism    Renal/GU negative Renal ROS     Musculoskeletal negative musculoskeletal ROS (+)    Abdominal Normal abdominal exam  (+)   Peds  Hematology Lab Results      Component                Value               Date                      WBC                      11.0 (H)            10/13/2022                HGB                      14.0                10/13/2022                HCT                      42.2                10/13/2022                MCV                      87.9                10/13/2022                PLT                      412 (H)             10/13/2022              Anesthesia Other Findings   Reproductive/Obstetrics                             Anesthesia Physical Anesthesia Plan  ASA: 3  Anesthesia Plan: MAC   Post-op Pain Management:    Induction:   PONV Risk Score and Plan: 1 and Propofol infusion and Treatment may vary due to age or medical condition  Airway Management Planned: Simple Face Mask and Nasal Cannula  Additional Equipment: None  Intra-op Plan:   Post-operative Plan:   Informed Consent: I have reviewed the patients History and Physical, chart, labs and discussed the procedure including the risks,  benefits and alternatives for the proposed  anesthesia with the patient or authorized representative who has indicated his/her understanding and acceptance.     Dental advisory given  Plan Discussed with: CRNA  Anesthesia Plan Comments:        Anesthesia Quick Evaluation

## 2022-10-30 NOTE — Anesthesia Postprocedure Evaluation (Signed)
Anesthesia Post Note  Patient: Jasmine Long  Procedure(s) Performed: COLONOSCOPY WITH PROPOFOL POLYPECTOMY ENDOSCOPIC MUCOSAL RESECTION SUBMUCOSAL LIFTING INJECTION HEMOSTASIS CLIP PLACEMENT     Patient location during evaluation: PACU Anesthesia Type: MAC Level of consciousness: awake and alert Pain management: pain level controlled Vital Signs Assessment: post-procedure vital signs reviewed and stable Respiratory status: spontaneous breathing, nonlabored ventilation, respiratory function stable and patient connected to nasal cannula oxygen Cardiovascular status: stable and blood pressure returned to baseline Postop Assessment: no apparent nausea or vomiting Anesthetic complications: no   No notable events documented.  Last Vitals:  Vitals:   10/30/22 1110 10/30/22 1123  BP: (!) 117/43 (!) 134/50  Pulse: 70 68  Resp: 17 13  Temp:    SpO2: 93% 94%    Last Pain:  Vitals:   10/30/22 1123  TempSrc:   PainSc: 0-No pain                 Earl Lites P Josey Dettmann

## 2022-10-30 NOTE — Op Note (Signed)
[facility] Patient Name: Jasmine Long Procedure Date: 10/30/2022 MRN: [id number] Attending MD: Jeani Hawking , MD, [id number] Date of Birth: 02-17-1950 CSN: [id number] Age: 72 Admit Type: Outpatient Procedure:                Colonoscopy Indications:              Therapeutic procedure Providers:                Jeani Hawking, MD, Norman Clay, RN, Suzy Bouchard, RN,                            Kandice Robinsons, Technician, Geoffery Lyons,                            Technician Referring MD:              Medicines:                Propofol per Anesthesia Complications:            No immediate complications. Estimated Blood Loss:     Estimated blood loss: none. Procedure:                Pre-Anesthesia Assessment:                           - Prior to the procedure, a History and Physical                            was performed, and patient medications and                            allergies were reviewed. The patient's tolerance of                            previous anesthesia was also reviewed. The risks                            and benefits of the procedure and the sedation                            options and risks were discussed with the patient.                            All questions were answered, and informed consent                            was obtained. Prior Anticoagulants: The patient has                            taken no anticoagulant or antiplatelet agents. ASA                            Grade Assessment: III - A patient with severe                            systemic disease. After  reviewing the risks and                            benefits, the patient was deemed in satisfactory                            condition to undergo the procedure.                           - Sedation was administered by an anesthesia                            professional. Deep sedation was attained.                           After obtaining informed consent, the colonoscope                             was passed under direct vision. Throughout the                            procedure, the patient's blood pressure, pulse, and                            oxygen saturations were monitored continuously. The                            CF-HQ190L ([id number]) Olympus colonoscope was                            introduced through the anus and advanced to the the                            cecum, identified by appendiceal orifice and                            ileocecal valve. The colonoscopy was technically                            difficult and complex. The patient tolerated the                            procedure well. The quality of the bowel                            preparation was evaluated using the BBPS [facility]                            Bowel Preparation Scale) with scores of: Right                            Colon = 2 (minor amount of residual staining, small  fragments of stool and/or opaque liquid, but mucosa                            seen well), Transverse Colon = 2 (minor amount of                            residual staining, small fragments of stool and/or                            opaque liquid, but mucosa seen well) and Left Colon                            = 2 (minor amount of residual staining, small                            fragments of stool and/or opaque liquid, but mucosa                            seen well). The total BBPS score equals 6. The                            quality of the bowel preparation was good. The                            ileocecal valve, appendiceal orifice, and rectum                            were photographed. Scope In: 10:08:54 AM Scope Out: 10:54:43 AM Scope Withdrawal Time: 0 hours 40 minutes 15 seconds  Total Procedure Duration: 0 hours 45 minutes 49 seconds  Findings:      An 18 mm polyp was found in the cecum. The polyp was sessile.       Polypectomy was attempted, initially using a saline  injection-lift       technique with a hot snare. Polyp resection was incomplete with this       device. This intervention then required a different device and       polypectomy technique. The polyp was removed with a piecemeal technique       using a hot snare. Resection and retrieval were complete. To prevent       bleeding post-intervention, four hemostatic clips were successfully       placed (MR safe). Clip manufacturer: [employer]. There was no       bleeding at the end of the procedure.      Four sessile polyps were found in the ascending colon and cecum. The       polyps were 2 to 7 mm in size. These polyps were removed with a cold       snare. Resection and retrieval were complete.      Scattered large-mouthed, medium-mouthed and small-mouthed diverticula       were found in the entire colon.      The previously thought ascending colon polyp was a cecal polyp. Ten mL       of EverLift was injected to lift the polyp, which was successful. The  majority of the polyp was removed with two passes. Smaller regions along       the edge and an [place] of mucosa were treated wtih a combination of a       hot snare and hot biopsy forceps. Any suspicious edges or islands were       ablated with the tip of the snare. Three Mantis hemoclips and one 360       hemoclips successfully closed the mucosal defect. Impression:               - One 18 mm polyp in the cecum, removed piecemeal                            using a hot snare. Resected and retrieved. Clip                            manufacturer: [employer]. Clips (MR safe)                            were placed.                           - Four 2 to 3 mm polyps in the ascending colon and                            in the cecum, removed with a cold snare. Resected                            and retrieved.                           - Diverticulosis in the entire examined colon. Moderate Sedation:      Not Applicable - Patient  had care per Anesthesia. Recommendation:           - Patient has a contact number available for                            emergencies. The signs and symptoms of potential                            delayed complications were discussed with the                            patient. Return to normal activities tomorrow.                            Written discharge instructions were provided to the                            patient.                           - Resume previous diet.                           - Continue present medications.                           -  Await pathology results.                           - Repeat colonoscopy in 6 months for surveillance. Procedure Code(s):        --- Professional ---                           [phone], Colonoscopy, flexible; with removal of                            tumor(s), polyp(s), or other lesion(s) by snare                            technique                           [id number], Colonoscopy, flexible; with directed                            submucosal injection(s), any substance Diagnosis Code(s):        --- Professional ---                           D12.0, Benign neoplasm of cecum                           D12.2, Benign neoplasm of ascending colon                           K57.30, Diverticulosis of large intestine without                            perforation or abscess without bleeding CPT copyright 2022 American Medical Association. All rights reserved. The codes documented in this report are preliminary and upon coder review may  be revised to meet current compliance requirements. Jeani Hawking, MD Jeani Hawking, MD 10/30/2022 11:08:18 AM This report has been signed electronically. Number of Addenda: 0

## 2022-10-30 NOTE — Discharge Instructions (Signed)

## 2022-10-30 NOTE — H&P (Signed)
Jasmine Long HPI: The patient is here for resection of a large ascending colon polyp in the ASC setting.  It was deemed to high risk to remove in the office setting and she was scheduled for resection in the hospital.  Recently she was diagnosed with a mild ascending colon diverticulitis.  Antibiotic treatment completely resolved her abdominal pain complaints.  She is well at this time.  Past Medical History:  Diagnosis Date   Allergy    Anxiety    Depression    Heart murmur    Hypertension    Thyroid disease    Ulcer     Past Surgical History:  Procedure Laterality Date   BREAST SURGERY     CESAREAN SECTION     CHOLECYSTECTOMY     NASAL SEPTUM SURGERY      Family History  Problem Relation Age of Onset   Lung cancer Mother    Dementia Father    Atrial fibrillation Father     Social History:  reports that she has been smoking cigarettes. She started smoking about 50 years ago. She has a 60 pack-year smoking history. She has never used smokeless tobacco. She reports that she does not drink alcohol and does not use drugs.  Allergies: No Known Allergies  Medications: Scheduled: Continuous:  sodium chloride      No results found for this or any previous visit (from the past 24 hour(s)).   No results found.  ROS:  As stated above in the HPI otherwise negative.  There were no vitals taken for this visit.    PE: Gen: NAD, Alert and Oriented HEENT:  [place]/AT, EOMI Neck: Supple, no LAD Lungs: CTA Bilaterally CV: RRR without M/G/R ABD: Soft, NTND, +BS Ext: No C/C/E  Assessment/Plan: 1) Ascending colon polyp - colonoscopy with polypectomy.  Lequan Dobratz D 10/30/2022, 9:56 AM

## 2022-10-30 NOTE — Transfer of Care (Signed)
Immediate Anesthesia Transfer of Care Note  Patient: Jasmine Long  Procedure(s) Performed: Procedure(s): COLONOSCOPY WITH PROPOFOL (N/A) POLYPECTOMY ENDOSCOPIC MUCOSAL RESECTION SUBMUCOSAL LIFTING INJECTION HEMOSTASIS CLIP PLACEMENT  Patient Location: PACU and Endoscopy Unit  Anesthesia Type:MAC  Level of Consciousness: awake, alert  and oriented  Airway & Oxygen Therapy: Patient Spontanous Breathing and Patient connected to nasal cannula oxygen  Post-op Assessment: Report given to RN and Post -op Vital signs reviewed and stable  Post vital signs: Reviewed and stable  Last Vitals:  Vitals:   10/30/22 0958 10/30/22 1100  BP: (!) 125/46 (!) 104/41  Pulse: 91 67  Resp: 12 14  Temp: 36.7 C   SpO2: 98% 100%    Complications: No apparent anesthesia complications

## 2022-11-02 ENCOUNTER — Encounter (HOSPITAL_COMMUNITY): Payer: Self-pay | Admitting: Gastroenterology

## 2022-11-03 LAB — SURGICAL PATHOLOGY

## 2022-11-04 ENCOUNTER — Telehealth: Payer: Self-pay | Admitting: Emergency Medicine

## 2022-11-04 NOTE — Telephone Encounter (Signed)
Patient called and said her gastroenterologist Dr. Elnoria Howard will be sending Dr. Alvy Bimler information/results. She wanted a call back once DR. Alvy Bimler goes over them. She wants to know if he would like to see her after he goes over it. Best callback is [phone].

## 2022-11-05 NOTE — Telephone Encounter (Signed)
Pt called wanting Dr. Alvy Bimler to know she just got diagnose with colon cancer and her anxiety is very high. Pt is wanting to speak with a nurse about  her Xanax. Please advise.

## 2022-11-06 DIAGNOSIS — C182 Malignant neoplasm of ascending colon: Secondary | ICD-10-CM | POA: Diagnosis not present

## 2022-11-06 NOTE — Telephone Encounter (Signed)
Patient called and wanted to know if she could increase her Xanax from 1 a day to 2 a day due to her increase of anxiety from her cancer diagnosis. She said she needs surgery for the cancer and has na appointment with a surgeon, Dr. Cliffton Asters, on 11/13/2022. Patient would like a call back at [phone].

## 2022-11-07 ENCOUNTER — Other Ambulatory Visit: Payer: Self-pay | Admitting: Emergency Medicine

## 2022-11-07 DIAGNOSIS — F4323 Adjustment disorder with mixed anxiety and depressed mood: Secondary | ICD-10-CM

## 2022-11-07 DIAGNOSIS — F418 Other specified anxiety disorders: Secondary | ICD-10-CM

## 2022-11-07 MED ORDER — ALPRAZOLAM 0.5 MG PO TABS
0.5000 mg | ORAL_TABLET | Freq: Two times a day (BID) | ORAL | 1 refills | Status: DC | PRN
Start: 2022-11-07 — End: 2023-01-29

## 2022-11-07 NOTE — Telephone Encounter (Signed)
Yes.  New prescription sent to pharmacy of record today.  Thanks.

## 2022-11-13 ENCOUNTER — Telehealth: Payer: Self-pay

## 2022-11-13 ENCOUNTER — Other Ambulatory Visit: Payer: Self-pay | Admitting: Surgery

## 2022-11-13 DIAGNOSIS — C180 Malignant neoplasm of cecum: Secondary | ICD-10-CM | POA: Diagnosis not present

## 2022-11-13 NOTE — Telephone Encounter (Signed)
Transition Care Management Follow-up Telephone Call Date of discharge and from where: [facility] 9/17 How have you been since you were released from the hospital? Doing fine and has followed up with PCP Any questions or concerns? No  Items Reviewed: Did the pt receive and understand the discharge instructions provided? Yes  Medications obtained and verified? Yes  Other? No  Any new allergies since your discharge? No  Dietary orders reviewed? No Do you have support at home? Yes     Follow up appointments reviewed:  PCP Hospital f/u appt confirmed? No  Scheduled to see  on  @ . Specialist Hospital f/u appt confirmed? Yes  Scheduled to see  on  @ . Are transportation arrangements needed? no If their condition worsens, is the pt aware to call PCP or go to the Emergency Dept.? Yes Was the patient provided with contact information for the PCP's office or ED? Yes Was to pt encouraged to call back with questions or concerns? Yes

## 2022-11-13 NOTE — Telephone Encounter (Signed)
Transition Care Management Unsuccessful Follow-up Telephone Call  Date of discharge and from where:  [facility] 9/17  Attempts:  1st Attempt  Reason for unsuccessful TCM follow-up call:  No answer/busy   Jasmine Long [place]  [facility], [facility] Guide, Phone: [phone] Website: [email or web address]

## 2022-11-23 ENCOUNTER — Ambulatory Visit: Payer: Medicare HMO | Admitting: Emergency Medicine

## 2022-11-26 ENCOUNTER — Encounter: Payer: Self-pay | Admitting: Emergency Medicine

## 2022-11-26 ENCOUNTER — Ambulatory Visit (INDEPENDENT_AMBULATORY_CARE_PROVIDER_SITE_OTHER): Payer: Medicare HMO | Admitting: Emergency Medicine

## 2022-11-26 VITALS — BP 136/74 | HR 100 | Temp 97.8°F | Ht 69.0 in | Wt 177.4 lb

## 2022-11-26 DIAGNOSIS — E1169 Type 2 diabetes mellitus with other specified complication: Secondary | ICD-10-CM | POA: Diagnosis not present

## 2022-11-26 DIAGNOSIS — F17200 Nicotine dependence, unspecified, uncomplicated: Secondary | ICD-10-CM | POA: Diagnosis not present

## 2022-11-26 DIAGNOSIS — F17210 Nicotine dependence, cigarettes, uncomplicated: Secondary | ICD-10-CM | POA: Diagnosis not present

## 2022-11-26 DIAGNOSIS — E785 Hyperlipidemia, unspecified: Secondary | ICD-10-CM

## 2022-11-26 DIAGNOSIS — Z7984 Long term (current) use of oral hypoglycemic drugs: Secondary | ICD-10-CM

## 2022-11-26 DIAGNOSIS — R011 Cardiac murmur, unspecified: Secondary | ICD-10-CM

## 2022-11-26 DIAGNOSIS — I10 Essential (primary) hypertension: Secondary | ICD-10-CM

## 2022-11-26 DIAGNOSIS — F418 Other specified anxiety disorders: Secondary | ICD-10-CM | POA: Diagnosis not present

## 2022-11-26 DIAGNOSIS — C180 Malignant neoplasm of cecum: Secondary | ICD-10-CM | POA: Diagnosis not present

## 2022-11-26 DIAGNOSIS — E039 Hypothyroidism, unspecified: Secondary | ICD-10-CM | POA: Diagnosis not present

## 2022-11-26 MED ORDER — NICOTINE 21 MG/24HR TD PT24: 21.0000 mg | MEDICATED_PATCH | Freq: Every day | TRANSDERMAL | 3 refills | Status: DC

## 2022-11-26 NOTE — Patient Instructions (Signed)

## 2022-11-26 NOTE — Assessment & Plan Note (Signed)
Smoking cessation advice given Patient on Wellbutrin 300 mg daily Recommend NicoDerm patches as requested

## 2022-11-26 NOTE — Assessment & Plan Note (Signed)
Chronic but stable Continues BuSpar 7.5 mg twice a day and Lexapro 10 mg daily Uses alprazolam as needed

## 2022-11-26 NOTE — Assessment & Plan Note (Signed)
Lab Results  Component Value Date   TSH 1.420 09/11/2022  Clinically euthyroid Continue levothyroxine 125 mcg daily

## 2022-11-26 NOTE — Assessment & Plan Note (Signed)
Recently diagnosed and surgery tentatively scheduled for next December

## 2022-11-26 NOTE — Assessment & Plan Note (Signed)
Lab Results  Component Value Date   HGBA1C 5.8 (H) 09/11/2022  Chronic stable conditions Continue metformin 1000 mg twice a day and atorvastatin 20 mg daily

## 2022-11-26 NOTE — Assessment & Plan Note (Signed)
Smoking cessation advice given Requesting NicoDerm patches Continues Wellbutrin

## 2022-11-26 NOTE — Assessment & Plan Note (Signed)
Has not been seen by cardiologist in several years No symptoms or findings of congestive heart failure Recommend cardiology evaluation and echocardiogram Referral placed today

## 2022-11-26 NOTE — Assessment & Plan Note (Signed)
BP Readings from Last 3 Encounters:  11/26/22 136/74  10/30/22 (!) 134/50  10/28/22 (!) 122/56  Well-controlled hyper tension Continue valsartan HCT 160-12.5 mg daily Also on metoprolol succinate 25 mg daily

## 2022-11-26 NOTE — Progress Notes (Signed)
Jasmine Long 72 y.o.   Chief Complaint  Patient presents with   Medical Management of Chronic Issues    Patient states she is concern about her weight loss    HISTORY OF PRESENT ILLNESS: This is a 72 y.o. female here for follow-up of chronic medical problems Chronic smoker, on Wellbutrin, requesting nicotine patches Recently diagnosed with adenocarcinoma of cecum.  Evaluated by Careers adviser.  Most likely surgery in December. Getting CT of chest tomorrow for staging purposes.  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 History of heart murmur.  Will need preop cardiology evaluation Weight loss concerns but gaining it back lately.  HPI   Prior to Admission medications   Medication Sig Start Date End Date Taking? Authorizing Provider  ALPRAZolam Prudy Feeler) 0.5 MG tablet Take 1 tablet (0.5 mg total) by mouth 2 (two) times daily as needed for anxiety. 11/07/22  Yes SagardiaEilleen Kempf, MD  amoxicillin-clavulanate (AUGMENTIN) 875-125 MG tablet Take 1 tablet by mouth every 12 (twelve) hours. 10/13/22  Yes Derwood Kaplan, MD  atorvastatin (LIPITOR) 20 MG tablet Take 1 tablet by mouth once daily 08/18/22  Yes Tahjai Schetter, Eilleen Kempf, MD  buPROPion (WELLBUTRIN XL) 300 MG 24 hr tablet Take 1 tablet by mouth once daily 08/18/22  Yes Artha Stavros, Eilleen Kempf, MD  busPIRone (BUSPAR) 7.5 MG tablet Take 1 tablet (7.5 mg total) by mouth 2 (two) times daily. 07/16/22  Yes Cookie Pore, Eilleen Kempf, MD  escitalopram (LEXAPRO) 10 MG tablet Take 1 tablet (10 mg total) by mouth daily. 12/25/21 12/20/22 Yes Tashiana Lamarca, Eilleen Kempf, MD  gabapentin (NEURONTIN) 300 MG capsule Take 300 mg by mouth as needed (numbness). 08/14/22  Yes [provider]  levothyroxine (SYNTHROID) 125 MCG tablet TAKE 1 TABLET BY MOUTH ONCE DAILY BEFORE BREAKFAST 04/14/22  Yes Quamere Mussell, Eilleen Kempf, MD  metFORMIN (GLUCOPHAGE) 1000 MG tablet TAKE 1 TABLET BY MOUTH TWICE DAILY WITH A MEAL 08/18/22  Yes Eliyah Mcshea, Eilleen Kempf, MD  metoprolol succinate  (TOPROL-XL) 25 MG 24 hr tablet Take 1 tablet by mouth once daily 05/27/22  Yes Bethaney Oshana, Eilleen Kempf, MD  pantoprazole (PROTONIX) 40 MG tablet Take 1 tablet twice a day for 2 weeks and then daily after that 08/08/22  Yes Timtohy Broski, Eilleen Kempf, MD  valsartan-hydrochlorothiazide (DIOVAN-HCT) 160-12.5 MG tablet Take 1 tablet by mouth once daily 05/27/22  Yes Aashir Umholtz, Eilleen Kempf, MD    No Known Allergies  Patient Active Problem List   Diagnosis Date Noted   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   Cloudy urine 07/16/2022   Weight loss 05/13/2022   Constipation 05/13/2022   Change in multiple pigmented skin lesions 04/22/2022   Paresthesia of both lower extremities 04/22/2022   Abnormal MRI, lumbar spine 04/22/2022   Vitreous floaters of right eye 04/07/2022   Hematoma of scalp 12/25/2021   Smokers' cough (HCC) 12/26/2020   Chronic bilateral low back pain with left-sided sciatica 12/26/2020   Chronic sinus complaints 12/26/2020   Chronic anxiety 06/28/2019   History of peptic ulcer 04/30/2017   Hyperlipidemia 12/11/2015   Insomnia 12/11/2015   Panic attack as reaction to stress 12/11/2015   Current smoker 12/11/2015   Chronic sinusitis 12/11/2015   Dyslipidemia associated with type 2 diabetes mellitus (HCC) 05/15/2015   Hypothyroid 04/29/2011   BMI 40.0-44.9, adult (HCC) 04/29/2011   Depression with anxiety 04/29/2011   GERD (gastroesophageal reflux disease) 04/29/2011   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   Nicotine addiction 04/29/2011   Essential hypertension 04/29/2011   Murmur, cardiac 04/29/2011    Past  Medical History:  Diagnosis Date   Allergy    Anxiety    Depression    Heart murmur    Hypertension    Thyroid disease    Ulcer     Past Surgical History:  Procedure Laterality Date   BREAST SURGERY     CESAREAN SECTION     CHOLECYSTECTOMY     COLONOSCOPY WITH PROPOFOL N/A 10/30/2022   Procedure: COLONOSCOPY WITH PROPOFOL;  Surgeon: Jeani Hawking, MD;  Location: [facility]  ENDOSCOPY;  Service: Gastroenterology;  Laterality: N/A;   ENDOSCOPIC MUCOSAL RESECTION  10/30/2022   Procedure: ENDOSCOPIC MUCOSAL RESECTION;  Surgeon: Jeani Hawking, MD;  Location: [facility] ENDOSCOPY;  Service: Gastroenterology;;   HEMOSTASIS CLIP PLACEMENT  10/30/2022   Procedure: HEMOSTASIS CLIP PLACEMENT;  Surgeon: Jeani Hawking, MD;  Location: [facility] ENDOSCOPY;  Service: Gastroenterology;;   NASAL SEPTUM SURGERY     POLYPECTOMY  10/30/2022   Procedure: POLYPECTOMY;  Surgeon: Jeani Hawking, MD;  Location: [facility];  Service: Gastroenterology;;   Sunnie Nielsen LIFTING INJECTION  10/30/2022   Procedure: SUBMUCOSAL LIFTING INJECTION;  Surgeon: Jeani Hawking, MD;  Location: [facility] ENDOSCOPY;  Service: Gastroenterology;;    Social History   Socioeconomic History   Marital status: Divorced    Spouse name: Not on file   Number of children: 2   Years of education: Not on file   Highest education level: Not on file  Occupational History   Occupation: Unemployed  Tobacco Use   Smoking status: Every Day    Current packs/day: 0.00    Average packs/day: 1.5 packs/day for 40.0 years (60.0 ttl pk-yrs)    Types: Cigarettes    Start date: 01/20/1972    Last attempt to quit: 01/20/2012    Years since quitting: 10.8   Smokeless tobacco: Never   Tobacco comments:    Pt doing E-cigarette as of 01/20/12  Vaping Use   Vaping status: Former  Substance and Sexual Activity   Alcohol use: No   Drug use: No   Sexual activity: Never  Other Topics Concern   Not on file  Social History Narrative   Patient on worker's compensation for an injury sustained on the job 5 years ago.      Right handed and Left Handed       Lives in a two story home       One son deceased.    Social Determinants of Health   Financial Resource Strain: Low Risk  (09/03/2022)   Overall Financial Resource Strain (CARDIA)    Difficulty of Paying Living Expenses: Not hard at all  Food Insecurity: No Food Insecurity (09/03/2022)   Hunger  Vital Sign    Worried About Running Out of Food in the Last Year: Never true    Ran Out of Food in the Last Year: Never true  Transportation Needs: No Transportation Needs (09/03/2022)   PRAPARE - Administrator, Civil Service (Medical): No    Lack of Transportation (Non-Medical): No  Physical Activity: Inactive (09/03/2022)   Exercise Vital Sign    Days of Exercise per Week: 0 days    Minutes of Exercise per Session: 0 min  Stress: No Stress Concern Present (09/03/2022)   [employer] of Occupational Health - Occupational Stress Questionnaire    Feeling of Stress : Not at all  Social Connections: Socially Isolated (09/03/2022)   Social Connection and Isolation Panel [NHANES]    Frequency of Communication with Friends and Family: More than three times a week  Frequency of Social Gatherings with Friends and Family: More than three times a week    Attends Religious Services: Never    Database administrator or Organizations: No    Attends Banker Meetings: Never    Marital Status: Divorced  Catering manager Violence: Not At Risk (09/03/2022)   Humiliation, Afraid, Rape, and Kick questionnaire    Fear of Current or Ex-Partner: No    Emotionally Abused: No    Physically Abused: No    Sexually Abused: No    Family History  Problem Relation Age of Onset   Lung cancer Mother    Dementia Father    Atrial fibrillation Father      Review of Systems  Constitutional:  Positive for weight loss.  HENT:  Positive for congestion (Chronic nasal congestion).   Respiratory:  Positive for cough. Negative for shortness of breath.   Cardiovascular: Negative.  Negative for chest pain and palpitations.  Gastrointestinal:  Negative for abdominal pain, diarrhea, nausea and vomiting.  Genitourinary: Negative.  Negative for dysuria and hematuria.  Musculoskeletal:  Positive for back pain.  Skin: Negative.  Negative for rash.  Neurological: Negative.  Negative for dizziness and  headaches.  All other systems reviewed and are negative.   Vitals:   11/26/22 1111  BP: 136/74  Pulse: 100  Temp: 97.8 F (36.6 C)  SpO2: 95%    Physical Exam Vitals reviewed.  Constitutional:      Appearance: Normal appearance.  HENT:     Head: Normocephalic.  Eyes:     Extraocular Movements: Extraocular movements intact.  Cardiovascular:     Rate and Rhythm: Normal rate and regular rhythm.     Heart sounds: Murmur heard.  Pulmonary:     Effort: Pulmonary effort is normal.     Breath sounds: Normal breath sounds.  Abdominal:     Palpations: Abdomen is soft.     Tenderness: There is no abdominal tenderness.  Skin:    General: Skin is warm and dry.  Neurological:     Mental Status: She is alert and oriented to person, place, and time.  Psychiatric:        Mood and Affect: Mood normal.        Behavior: Behavior normal.      ASSESSMENT & PLAN: A total of 46 minutes was spent with the patient and counseling/coordination of care regarding preparing for this visit, review of most recent office visit notes, review of most recent surgeon's office visit notes, review of most recent GI office visit notes, review of most recent blood work results, review of multiple chronic medical conditions under management, review of all medications, need for preop cardiology evaluation, prognosis, documentation, and need for follow-up.  Problem List Items Addressed This Visit       Cardiovascular and Mediastinum   Essential hypertension - Primary    BP Readings from Last 3 Encounters:  11/26/22 136/74  10/30/22 (!) 134/50  10/28/22 (!) 122/56  Well-controlled hyper tension Continue valsartan HCT 160-12.5 mg daily Also on metoprolol succinate 25 mg daily        Relevant Orders   Ambulatory referral to Cardiology     Digestive   Adenocarcinoma of cecum [facility])    Recently diagnosed and surgery tentatively scheduled for next December        Endocrine   Hypothyroid    Lab  Results  Component Value Date   TSH 1.420 09/11/2022  Clinically euthyroid Continue levothyroxine 125 mcg daily  Dyslipidemia associated with type 2 diabetes mellitus (HCC)    Lab Results  Component Value Date   HGBA1C 5.8 (H) 09/11/2022  Chronic stable conditions Continue metformin 1000 mg twice a day and atorvastatin 20 mg daily       Relevant Orders   Ambulatory referral to Cardiology     Other   Depression with anxiety    Chronic but stable Continues BuSpar 7.5 mg twice a day and Lexapro 10 mg daily Uses alprazolam as needed      Nicotine addiction    Smoking cessation advice given Patient on Wellbutrin 300 mg daily Recommend NicoDerm patches as requested      Relevant Medications   nicotine (NICODERM CQ) 21 mg/24hr patch   Heart murmur    Has not been seen by cardiologist in several years No symptoms or findings of congestive heart failure Recommend cardiology evaluation and echocardiogram Referral placed today      Relevant Orders   Ambulatory referral to Cardiology   Current smoker    Smoking cessation advice given Requesting NicoDerm patches Continues Wellbutrin      Relevant Medications   nicotine (NICODERM CQ) 21 mg/24hr patch   Patient Instructions  Managing the Challenge of Quitting Smoking Quitting smoking is a physical and mental challenge. You may have cravings, withdrawal symptoms, and temptation to smoke. Before quitting, work with your health care provider to make a plan that can help you manage quitting. Making a plan before you quit may keep you from smoking when you have the urge to smoke while trying to quit. How to manage lifestyle changes Managing stress Stress can make you want to smoke, and wanting to smoke may cause stress. It is important to find ways to manage your stress. You could try some of the following: Practice relaxation techniques. Breathe slowly and deeply, in through your nose and out through your mouth. Listen  to music. Soak in a bath or take a shower. Imagine a peaceful place or vacation. Get some support. Talk with family or friends about your stress. Join a support group. Talk with a counselor or therapist. Get some physical activity. Go for a walk, run, or bike ride. Play a favorite sport. Practice yoga.  Medicines Talk with your health care provider about medicines that might help you deal with cravings and make quitting easier for you. Relationships Social situations can be difficult when you are quitting smoking. To manage this, you can: Avoid parties and other social situations where people might be smoking. Avoid alcohol. Leave right away if you have the urge to smoke. Explain to your family and friends that you are quitting smoking. Ask for support and let them know you might be a bit grumpy. Plan activities where smoking is not an option. General instructions Be aware that many people gain weight after they quit smoking. However, not everyone does. To keep from gaining weight, have a plan in place before you quit, and stick to the plan after you quit. Your plan should include: Eating healthy snacks. When you have a craving, it may help to: Eat popcorn, or try carrots, celery, or other cut vegetables. Chew sugar-free gum. Changing how you eat. Eat small portion sizes at meals. Eat 4-6 small meals throughout the day instead of 1-2 large meals a day. Be mindful when you eat. You should avoid watching television or doing other things that might distract you as you eat. Exercising regularly. Make time to exercise each day. If you do not have  time for a long workout, do short bouts of exercise for 5-10 minutes several times a day. Do some form of strengthening exercise, such as weight lifting. Do some exercise that gets your heart beating and causes you to breathe deeply, such as walking fast, running, swimming, or biking. This is very important. Drinking plenty of water or other  low-calorie or no-calorie drinks. Drink enough fluid to keep your urine pale yellow.  How to recognize withdrawal symptoms Your body and mind may experience discomfort as you try to get used to not having nicotine in your system. These effects are called withdrawal symptoms. They may include: Feeling hungrier than normal. Having trouble concentrating. Feeling irritable or restless. Having trouble sleeping. Feeling depressed. Craving a cigarette. These symptoms may surprise you, but they are normal to have when quitting smoking. To manage withdrawal symptoms: Avoid places, people, and activities that trigger your cravings. Remember why you want to quit. Get plenty of sleep. Avoid coffee and other drinks that contain caffeine. These may worsen some of your symptoms. How to manage cravings Come up with a plan for how to deal with your cravings. The plan should include the following: A definition of the specific situation you want to deal with. An activity or action you will take to replace smoking. A clear idea for how this action will help. The name of someone who could help you with this. Cravings usually last for 5-10 minutes. Consider taking the following actions to help you with your plan to deal with cravings: Keep your mouth busy. Chew sugar-free gum. Suck on hard candies or a straw. Brush your teeth. Keep your hands and body busy. Change to a different activity right away. Squeeze or play with a ball. Do an activity or a hobby, such as making bead jewelry, practicing needlepoint, or working with wood. Mix up your normal routine. Take a short exercise break. Go for a quick walk, or run up and down stairs. Focus on doing something kind or helpful for someone else. Call a friend or family member to talk during a craving. Join a support group. Contact a quitline. Where to find support To get help or find a support group: Call the National Cancer Institute's Smoking Quitline:  1-800-QUIT-NOW (318)548-6982) Text QUIT to SmokefreeTXT: 106269 Where to find more information Visit these websites to find more information on quitting smoking: U.S. Department of Health and Human Services: www.smokefree.gov American Lung Association: www.freedomfromsmoking.org Centers for Disease Control and Prevention (CDC): FootballExhibition.com.br American Heart Association: www.heart.org Contact a health care provider if: You want to change your plan for quitting. The medicines you are taking are not helping. Your eating feels out of control or you cannot sleep. You feel depressed or become very anxious. Summary Quitting smoking is a physical and mental challenge. You will face cravings, withdrawal symptoms, and temptation to smoke again. Preparation can help you as you go through these challenges. Try different techniques to manage stress, handle social situations, and prevent weight gain. You can deal with cravings by keeping your mouth busy (such as by chewing gum), keeping your hands and body busy, calling family or friends, or contacting a quitline for people who want to quit smoking. You can deal with withdrawal symptoms by avoiding places where people smoke, getting plenty of rest, and avoiding drinks that contain caffeine. This information is not intended to replace advice given to you by your health care provider. Make sure you discuss any questions you have with your health care provider. Document  Revised: 01/03/2021 Document Reviewed: 01/03/2021 Elsevier Patient Education  2024 Elsevier Inc.      Edwina Barth, MD [facility]

## 2022-11-27 ENCOUNTER — Ambulatory Visit
Admission: RE | Admit: 2022-11-27 | Discharge: 2022-11-27 | Disposition: A | Discharge status: Home / Self Care | Payer: Medicare HMO | Source: Ambulatory Visit | Attending: Surgery | Admitting: Surgery

## 2022-11-27 DIAGNOSIS — C180 Malignant neoplasm of cecum: Secondary | ICD-10-CM | POA: Diagnosis not present

## 2022-11-27 DIAGNOSIS — R918 Other nonspecific abnormal finding of lung field: Secondary | ICD-10-CM | POA: Diagnosis not present

## 2022-11-27 DIAGNOSIS — R599 Enlarged lymph nodes, unspecified: Secondary | ICD-10-CM | POA: Diagnosis not present

## 2022-11-27 MED ORDER — IOPAMIDOL (ISOVUE-300) INJECTION 61%
500.0000 mL | Freq: Once | INTRAVENOUS | Status: AC | PRN
  Administered 2022-11-27: 65 mL via INTRAVENOUS

## 2022-11-28 ENCOUNTER — Encounter: Payer: Self-pay | Admitting: Emergency Medicine

## 2022-11-30 ENCOUNTER — Telehealth: Payer: Self-pay | Admitting: Emergency Medicine

## 2022-11-30 ENCOUNTER — Other Ambulatory Visit: Payer: Self-pay | Admitting: Emergency Medicine

## 2022-11-30 DIAGNOSIS — C799 Secondary malignant neoplasm of unspecified site: Secondary | ICD-10-CM

## 2022-11-30 DIAGNOSIS — C180 Malignant neoplasm of cecum: Secondary | ICD-10-CM

## 2022-11-30 DIAGNOSIS — R918 Other nonspecific abnormal finding of lung field: Secondary | ICD-10-CM

## 2022-11-30 NOTE — Telephone Encounter (Signed)
She can take Xanax twice a day.  Thanks.

## 2022-11-30 NOTE — Telephone Encounter (Signed)
FYI - patient is making her appointment for pulmonary, oncology and heart doctors.  Patient is having anxiety and wants to know how many times a day she can take her xanax - Please call patient and advise.  Phone:  [phone]

## 2022-11-30 NOTE — Telephone Encounter (Signed)
Patient is inquiring about CT scan results. Please advise

## 2022-12-02 ENCOUNTER — Inpatient Hospital Stay: Payer: Medicare HMO | Attending: Hematology | Admitting: Hematology

## 2022-12-02 ENCOUNTER — Inpatient Hospital Stay: Payer: Medicare HMO

## 2022-12-02 ENCOUNTER — Encounter: Payer: Self-pay | Admitting: Hematology

## 2022-12-02 VITALS — BP 134/60 | HR 92 | Temp 97.6°F | Resp 18 | Ht 69.0 in | Wt 173.5 lb

## 2022-12-02 DIAGNOSIS — F32A Depression, unspecified: Secondary | ICD-10-CM | POA: Insufficient documentation

## 2022-12-02 DIAGNOSIS — E119 Type 2 diabetes mellitus without complications: Secondary | ICD-10-CM | POA: Diagnosis not present

## 2022-12-02 DIAGNOSIS — Z803 Family history of malignant neoplasm of breast: Secondary | ICD-10-CM | POA: Insufficient documentation

## 2022-12-02 DIAGNOSIS — C180 Malignant neoplasm of cecum: Secondary | ICD-10-CM | POA: Insufficient documentation

## 2022-12-02 DIAGNOSIS — Z801 Family history of malignant neoplasm of trachea, bronchus and lung: Secondary | ICD-10-CM | POA: Insufficient documentation

## 2022-12-02 DIAGNOSIS — Z79899 Other long term (current) drug therapy: Secondary | ICD-10-CM | POA: Insufficient documentation

## 2022-12-02 DIAGNOSIS — J329 Chronic sinusitis, unspecified: Secondary | ICD-10-CM | POA: Diagnosis not present

## 2022-12-02 DIAGNOSIS — F419 Anxiety disorder, unspecified: Secondary | ICD-10-CM | POA: Insufficient documentation

## 2022-12-02 DIAGNOSIS — Z800 Family history of malignant neoplasm of digestive organs: Secondary | ICD-10-CM | POA: Insufficient documentation

## 2022-12-02 DIAGNOSIS — F17210 Nicotine dependence, cigarettes, uncomplicated: Secondary | ICD-10-CM | POA: Diagnosis not present

## 2022-12-02 DIAGNOSIS — Z634 Disappearance and death of family member: Secondary | ICD-10-CM | POA: Insufficient documentation

## 2022-12-02 DIAGNOSIS — R918 Other nonspecific abnormal finding of lung field: Secondary | ICD-10-CM | POA: Insufficient documentation

## 2022-12-02 DIAGNOSIS — Z860100 Personal history of colon polyps, unspecified: Secondary | ICD-10-CM | POA: Insufficient documentation

## 2022-12-02 LAB — CBC WITH DIFFERENTIAL/PLATELET
Abs Immature Granulocytes: 0.02 10*3/uL (ref 0.00–0.07)
Basophils Absolute: 0 10*3/uL (ref 0.0–0.1)
Basophils Relative: 1 %
Eosinophils Absolute: 0.1 10*3/uL (ref 0.0–0.5)
Eosinophils Relative: 1 %
HCT: 40.2 % (ref 36.0–46.0)
Hemoglobin: 13.5 g/dL (ref 12.0–15.0)
Immature Granulocytes: 0 %
Lymphocytes Relative: 30 %
Lymphs Abs: 2 10*3/uL (ref 0.7–4.0)
MCH: 29.3 pg (ref 26.0–34.0)
MCHC: 33.6 g/dL (ref 30.0–36.0)
MCV: 87.2 fL (ref 80.0–100.0)
Monocytes Absolute: 0.6 10*3/uL (ref 0.1–1.0)
Monocytes Relative: 9 %
Neutro Abs: 3.9 10*3/uL (ref 1.7–7.7)
Neutrophils Relative %: 59 %
Platelets: 411 10*3/uL — ABNORMAL HIGH (ref 150–400)
RBC: 4.61 MIL/uL (ref 3.87–5.11)
RDW: 13.9 % (ref 11.5–15.5)
WBC: 6.6 10*3/uL (ref 4.0–10.5)
nRBC: 0 % (ref 0.0–0.2)

## 2022-12-02 LAB — COMPREHENSIVE METABOLIC PANEL
ALT: 7 U/L (ref 0–44)
AST: 12 U/L — ABNORMAL LOW (ref 15–41)
Albumin: 4.2 g/dL (ref 3.5–5.0)
Alkaline Phosphatase: 83 U/L (ref 38–126)
Anion gap: 8 (ref 5–15)
BUN: 11 mg/dL (ref 8–23)
CO2: 26 mmol/L (ref 22–32)
Calcium: 10 mg/dL (ref 8.9–10.3)
Chloride: 100 mmol/L (ref 98–111)
Creatinine, Ser: 0.87 mg/dL (ref 0.44–1.00)
GFR, Estimated: >60 mL/min (ref >60)
Glucose, Bld: 100 mg/dL — ABNORMAL HIGH (ref 70–99)
Potassium: 4 mmol/L (ref 3.5–5.1)
Sodium: 134 mmol/L — ABNORMAL LOW (ref 135–145)
Total Bilirubin: 1 mg/dL (ref <1.2)
Total Protein: 7.5 g/dL (ref 6.5–8.1)

## 2022-12-02 NOTE — Telephone Encounter (Signed)
Spoke to patient about Xanax and she understood

## 2022-12-02 NOTE — Progress Notes (Signed)
[facility]   Telephone:([phone]) [phone] Fax:(336) [phone]   Clinic New Consult Note   Long Care Team: [name], MD as PCP - General (Internal Medicine) Glendale Chard, DO as Consulting Physician (Neurology) Malachy Mood, MD as Consulting Physician (Hematology and Oncology) 12/02/2022  CHIEF COMPLAINTS/PURPOSE OF CONSULTATION:  Lung mass and newly diagnosed colon cancer   Referring physician: Dr. Cliffton Asters   Discussed the use of AI scribe software for clinical note transcription with the Long, who gave verbal consent to proceed.  History of Present Illness   A Jasmine Long with a history of smoking, anxiety, depression, and type 2 diabetes presents for a new consult for newly diagnosed colon cancer and lung mass.  Long was referred by her surgeon Dr. Cliffton Asters.  She presents to clinic in a wheelchair by herself.    The Long has been experiencing numbness in her feet and has lost a significant amount of weight since April.  She reports intermittent constipation, but denies any hematochezia.  She was referred to GI Dr. Audley Hose and underwent colonoscopy in office in July 2024 (per pt), which showed multiple polyps.  Due to the large 1.8 cm polyp in cecum, she underwent second colonoscopy and polypectomy in hospital on October 30, 2022.  For 2 to 3 cm polyps in the ascending colon and cecum were also removed.  Pathology showed tubular adenoma, and a focal invasive well to moderately differentiated adenocarcinoma in the large polyp from cecum.  Long was referred to Dr. Cliffton Asters to consider surgery.  She underwent staging CT abdomen pelvis on October 13, 2022 which was negative for metastasis, and a CT chest on November 27, 2022 which unfortunately showed a large subpleural 4.9 x 2.8 cm mass in the left upper lobe, and a small 8 mm nodule in the right upper lobe.  CT scan also showed 2.4-3.3 cm supraclavicular and mediastinal adenopathy, highly concerning for metastatic  endowed.  She was referred to [place] for further management.  The Long also has a family history of colon and lung cancer. The Long's mother had non-smoking lung cancer and her paternal aunt had colon cancer. The Long has also had multiple colon polyps, one of which was cancerous.         MEDICAL HISTORY:  Past Medical History:  Diagnosis Date   Allergy    Anxiety    Depression    Heart murmur    Hypertension    Thyroid disease    Ulcer     SURGICAL HISTORY: Past Surgical History:  Procedure Laterality Date   BREAST SURGERY     CESAREAN SECTION     CHOLECYSTECTOMY     COLONOSCOPY WITH PROPOFOL N/A 10/30/2022   Procedure: COLONOSCOPY WITH PROPOFOL;  Surgeon: Jeani Hawking, MD;  Location: [facility];  Service: Gastroenterology;  Laterality: N/A;   ENDOSCOPIC MUCOSAL RESECTION  10/30/2022   Procedure: ENDOSCOPIC MUCOSAL RESECTION;  Surgeon: Jeani Hawking, MD;  Location: [facility];  Service: Gastroenterology;;   HEMOSTASIS CLIP PLACEMENT  10/30/2022   Procedure: HEMOSTASIS CLIP PLACEMENT;  Surgeon: Jeani Hawking, MD;  Location: [facility];  Service: Gastroenterology;;   NASAL SEPTUM SURGERY     POLYPECTOMY  10/30/2022   Procedure: POLYPECTOMY;  Surgeon: Jeani Hawking, MD;  Location: [facility];  Service: Gastroenterology;;   Sunnie Nielsen LIFTING INJECTION  10/30/2022   Procedure: SUBMUCOSAL LIFTING INJECTION;  Surgeon: Jeani Hawking, MD;  Location: [facility];  Service: Gastroenterology;;    SOCIAL HISTORY: Social History   Socioeconomic History  Marital status: Divorced    Spouse name: Not on file   Number of children: 2   Years of education: Not on file   Highest education level: Not on file  Occupational History   Occupation: Unemployed  Tobacco Use   Smoking status: Every Day    Current packs/day: 0.00    Average packs/day: 1.5 packs/day for 40.0 years (60.0 ttl pk-yrs)    Types: Cigarettes    Start date: 01/20/1972    Last attempt to quit: 01/20/2012     Years since quitting: 10.8   Smokeless tobacco: Never   Tobacco comments:    Pt doing E-cigarette as of 01/20/12  Vaping Use   Vaping status: Former  Substance and Sexual Activity   Alcohol use: No   Drug use: No   Sexual activity: Never  Other Topics Concern   Not on file  Social History Narrative   Long on worker's compensation for an injury sustained on the job 5 years ago.      Right handed and Left Handed       Lives in a two story home       One son deceased.    Social Determinants of Health   Financial Resource Strain: Low Risk  (09/03/2022)   Overall Financial Resource Strain (CARDIA)    Difficulty of Paying Living Expenses: Not hard at all  Food Insecurity: No Food Insecurity (09/03/2022)   Hunger Vital Sign    Worried About Running Out of Food in the Last Year: Never true    Ran Out of Food in the Last Year: Never true  Transportation Needs: No Transportation Needs (09/03/2022)   PRAPARE - Administrator, Civil Service (Medical): No    Lack of Transportation (Non-Medical): No  Physical Activity: Inactive (09/03/2022)   Exercise Vital Sign    Days of Exercise per Week: 0 days    Minutes of Exercise per Session: 0 min  Stress: No Stress Concern Present (09/03/2022)   [employer] of Occupational Health - Occupational Stress Questionnaire    Feeling of Stress : Not at all  Social Connections: Socially Isolated (09/03/2022)   Social Connection and Isolation Panel [NHANES]    Frequency of Communication with Friends and Family: More than three times a week    Frequency of Social Gatherings with Friends and Family: More than three times a week    Attends Religious Services: Never    Database administrator or Organizations: No    Attends Banker Meetings: Never    Marital Status: Divorced  Catering manager Violence: Not At Risk (09/03/2022)   Humiliation, Afraid, Rape, and Kick questionnaire    Fear of Current or Ex-Partner: No     Emotionally Abused: No    Physically Abused: No    Sexually Abused: No    FAMILY HISTORY: Family History  Problem Relation Age of Onset   Lung cancer Mother 59   Dementia Father    Atrial fibrillation Father    Cancer Paternal Aunt        breast cancer   Colon cancer Paternal Aunt    Colon cancer Cousin     ALLERGIES:  has No Known Allergies.  MEDICATIONS:  Current Outpatient Medications  Medication Sig Dispense Refill   ALPRAZolam (XANAX) 0.5 MG tablet Take 1 tablet (0.5 mg total) by mouth 2 (two) times daily as needed for anxiety. 30 tablet 1   atorvastatin (LIPITOR) 20 MG tablet Take 1 tablet by mouth  once daily 90 tablet 1   buPROPion (WELLBUTRIN XL) 300 MG 24 hr tablet Take 1 tablet by mouth once daily 90 tablet 1   busPIRone (BUSPAR) 7.5 MG tablet Take 1 tablet (7.5 mg total) by mouth 2 (two) times daily. 60 tablet 1   escitalopram (LEXAPRO) 10 MG tablet Take 1 tablet (10 mg total) by mouth daily. 90 tablet 3   gabapentin (NEURONTIN) 300 MG capsule Take 300 mg by mouth as needed (numbness).     levothyroxine (SYNTHROID) 125 MCG tablet TAKE 1 TABLET BY MOUTH ONCE DAILY BEFORE BREAKFAST 90 tablet 0   metFORMIN (GLUCOPHAGE) 1000 MG tablet TAKE 1 TABLET BY MOUTH TWICE DAILY WITH A MEAL 180 tablet 1   metoprolol succinate (TOPROL-XL) 25 MG 24 hr tablet Take 1 tablet by mouth once daily 90 tablet 3   nicotine (NICODERM CQ) 21 mg/24hr patch Place 1 patch (21 mg total) onto the skin daily. Jasmine patch 3   pantoprazole (PROTONIX) 40 MG tablet Take 1 tablet twice a day for 2 weeks and then daily after that 90 tablet 3   valsartan-hydrochlorothiazide (DIOVAN-HCT) 160-12.5 MG tablet Take 1 tablet by mouth once daily 90 tablet 3   No current facility-administered medications for this visit.    REVIEW OF SYSTEMS:   Constitutional: Denies fevers, chills or abnormal night sweats, (+) fatigue and weight loss  Eyes: Denies blurriness of vision, double vision or watery eyes Ears, nose,  mouth, throat, and face: Denies mucositis or sore throat Respiratory: Denies cough, dyspnea or wheezes Cardiovascular: Denies palpitation, chest discomfort or lower extremity swelling Gastrointestinal:  Denies nausea, heartburn or change in bowel habits Skin: Denies abnormal skin rashes Lymphatics: Denies new lymphadenopathy or easy bruising Neurological:Denies numbness, tingling or new weaknesses Behavioral/Psych: Mood is stable, no new changes  All other systems were reviewed with the Long and are negative.  PHYSICAL EXAMINATION: ECOG PERFORMANCE STATUS: 1 - Symptomatic but completely ambulatory  Vitals:   12/02/22 1514 12/02/22 1515  BP: (!) 157/62 134/60  Pulse: 92   Resp: 18   Temp: 97.6 F (36.4 C)   SpO2: 98%    Filed Weights   12/02/22 1514  Weight: 173 lb 8 oz (78.7 kg)    GENERAL:alert, no distress and comfortable SKIN: skin color, texture, turgor are normal, no rashes or significant lesions EYES: normal, conjunctiva are pink and non-injected, sclera clear OROPHARYNX:no exudate, no erythema and lips, buccal mucosa, and tongue normal  NECK: supple, thyroid normal size, non-tender, without nodularity LYMPH:  no palpable lymphadenopathy in the cervical, axillary or inguinal LUNGS: clear to auscultation and percussion with normal breathing effort HEART: regular rate & rhythm and no murmurs and no lower extremity edema ABDOMEN:abdomen soft, non-tender and normal bowel sounds Musculoskeletal:no cyanosis of digits and no clubbing  PSYCH: alert & oriented x 3 with fluent speech NEURO: no focal motor/sensory deficits  Physical Exam          LABORATORY DATA:  I have reviewed the data as listed    Latest Ref Rng & Units 12/02/2022    4:14 PM 10/13/2022    3:43 PM 09/11/2022   12:59 PM  CBC  WBC 4.0 - 10.5 K/uL 6.6  11.0  8.2   Hemoglobin 12.0 - 15.0 g/dL 81.1  91.4  78.2   Hematocrit 36.0 - 46.0 % 40.2  42.2  42.8   Platelets 150 - 400 K/uL 411  412  410      @cmpl @  RADIOGRAPHIC STUDIES: I have personally reviewed  the radiological images as listed and agreed with the findings in the report. CT CHEST W CONTRAST  Result Date: 11/27/2022 CLINICAL DATA:  Recently diagnosed cecal adenocarcinoma. * Tracking Code: BO * EXAM: CT CHEST WITH CONTRAST TECHNIQUE: Multidetector CT imaging of the chest was performed during intravenous contrast administration. RADIATION DOSE REDUCTION: This exam was performed according to the departmental dose-optimization program which includes automated exposure control, adjustment of the mA and/or kV according to Long size and/or use of iterative reconstruction technique. CONTRAST:  65mL ISOVUE-300 IOPAMIDOL (ISOVUE-300) INJECTION 61% COMPARISON:  Chest CT 10/20/2008.  Abdominopelvic CT 10/13/2022. FINDINGS: Cardiovascular: No acute vascular findings are identified. There is diffuse atherosclerosis of the aorta, great vessels and coronary arteries. Probable calcifications of the aortic valve. The heart size is normal. There is no pericardial effusion. Mediastinum/Nodes: New supraclavicular and mediastinal adenopathy. A left supraclavicular node measures 3.3 x 2.3 cm on image 12/2. There is a precarinal node measuring 2.4 cm short axis on image 57/2. Small left hilar lymph nodes are noted. The thyroid gland, trachea and esophagus demonstrate no significant findings. Lungs/Pleura: No pleural effusion or pneumothorax. New large subpleural mass anteriorly in the left upper lobe measuring 4.9 x 2.8 cm on image 38/3. This is well-circumscribed with heterogeneous enhancement, highly suspicious for neoplasm. There is a small irregular right upper lobe nodule measuring 8 x 5 mm on image 52/3 which appears similar to the remote CT. No other suspicious pulmonary nodules are identified. There is mild central airway thickening and scattered subpleural reticulation. Upper abdomen: The visualized upper abdomen appears stable, without significant  findings. Low-density adjacent to the falciform ligament likely represents focal fat. Musculoskeletal/Chest wall: There is possible mild erosion of the left 2nd rib adjacent to the described left upper lobe mass. No other suspicious osseous lesions are identified. There is mild multilevel spondylosis. Unless specific follow-up recommendations are mentioned in the findings or impression sections, no imaging follow-up of any mentioned incidental findings is recommended. IMPRESSION: 1. New large subpleural mass anteriorly in the left upper lobe, presumably a large metastasis. Primary bronchogenic carcinoma is a consideration. 2. New supraclavicular and mediastinal adenopathy, consistent with metastatic disease. The left supraclavicular nodal mass should be amenable to percutaneous biopsy under ultrasound. PET-CT may be helpful for further staging. 3. Possible mild erosion of the left 2nd rib adjacent to the described left upper lobe mass. 4. No other evidence of metastatic disease in the chest. 5.  Aortic Atherosclerosis (ICD10-I70.0). Electronically Signed   By: Carey Bullocks M.D.   On: 11/27/2022 17:54    ASSESSMENT & PLAN:  72 yo female     Right Colon Cancer Newly diagnosed following colonoscopy for constipation.  She was found to have a 1.8 cm polyp in the cecum, status post proctectomy which showed well to moderate differentiated adenocarcinoma -Plan for surgery pending further workup.  Lung Mass in the thoracic adenopathy Newly discovered on chest CT which was obtained for colon cancer staging. High suspicion for malignancy given 50-year smoking history and recent diagnosis of colon cancer. Differential includes primary lung cancer, metastatic colon cancer, or less likely, a benign process. -Order PET scan to further characterize the mass and assess for possible metastatic disease. -Consult with pulmonology and interventional radiology for biopsy of the lung mass and enlarged lymph  nodes.  Chronic Sinusitis Longstanding history of sinus issues with recent increase in mucus production. -Continue current sinus medications as prescribed.  Depression/Anxiety History of depression and anxiety following the death of her son. Currently managed  with Wellbutrin, alprazolam, Buspar, and Lexapro. -Continue current psychiatric medications as prescribed.  Smoking 50-year history with recent increase due to stress of new cancer diagnosis. Currently using nicotine patches. -Encourage continued use of nicotine patches and smoking cessation efforts.  Family History of Cancer Mother with non-smoking related lung cancer, paternal aunt with colon and breast cancer, and multiple second/third cousins with colon cancer. -Consider genetic testing for hereditary cancer syndromes given family history.  General Health Maintenance -Order labs to check kidney and liver function, blood counts, and tumor markers. -Recommend mammogram given it has not been done in several years.      Plan -lab today  -I spoke with pulmonologist Dr. Delton Coombes (pt has appointment with him in Dec 2024) and IR Dr. Archer Asa and we decided to proceed with ultrasound-guided left supraclavicular node biopsy.  Long was initially reluctant, but finally agreed with biopsy after discussion. -PET scan as soon as possible -Follow-up in 2 weeks after her biopsy and PET scan.   Orders Placed This Encounter  Procedures   NM PET Image Initial (PI) Skull Base To Thigh    Left lung mass, on recent CT scan for newly diagnosed colon cancer    Standing Status:   Future    Standing Expiration Date:   12/02/2023    Order Specific Question:   If indicated for the ordered procedure, I authorize the administration of a radiopharmaceutical per Radiology protocol    Answer:   Yes    Order Specific Question:   Preferred imaging location?    Answer:   [facility]   [place] CORE BIOPSY (LYMPH NODES)    Standing Status:   Future     Standing Expiration Date:   12/02/2023    Scheduling Instructions:     Approved by Dr. Archer Asa    Order Specific Question:   Lab orders requested (DO NOT place separate lab orders, these will be automatically ordered during procedure specimen collection):    Answer:   Surgical Pathology    Order Specific Question:   Lab orders requested (DO NOT place separate lab orders, these will be automatically ordered during procedure specimen collection):    Answer:   Cytology - Non Pap    Order Specific Question:   Reason for Exam (SYMPTOM  OR DIAGNOSIS REQUIRED)    Answer:   biopsy of left [place] node, confirm malignancy and origin of malignancy    Order Specific Question:   Preferred location?    Answer:   [facility]   Comprehensive metabolic panel    Standing Status:   Standing    Number of Occurrences:   50    Standing Expiration Date:   12/02/2023   CEA (IN HOUSE-[facility])    Standing Status:   Standing    Number of Occurrences:   20    Standing Expiration Date:   12/02/2023   CBC with Differential/Platelet    Standing Status:   Standing    Number of Occurrences:   50    Standing Expiration Date:   12/02/2023   Ambulatory referral to Genetics    Referral Priority:   Routine    Referral Type:   Consultation    Referral Reason:   Specialty Services Required    Number of Visits Requested:   1    All questions were answered. The Long knows to call the clinic with any problems, questions or concerns. I spent 45 minutes counseling the Long face to face. The total time spent in  the appointment was 60 minutes and more than 50% was on counseling.     Malachy Mood, MD 12/02/2022 9:38 PM

## 2022-12-03 ENCOUNTER — Other Ambulatory Visit: Payer: Self-pay

## 2022-12-03 LAB — CEA (IN HOUSE-CHCC): CEA (CHCC-In House): 3.82 ng/mL (ref 0.00–5.00)

## 2022-12-07 ENCOUNTER — Ambulatory Visit: Payer: Medicare HMO | Admitting: Emergency Medicine

## 2022-12-07 ENCOUNTER — Encounter: Payer: Self-pay | Admitting: General Practice

## 2022-12-07 NOTE — Telephone Encounter (Signed)
Patient asked that I call back tommorrow to schedeule.

## 2022-12-07 NOTE — Progress Notes (Unsigned)
Mir, Al Corpus, MD  Caroleen Hamman, [place] PROCEDURE / BIOPSY REVIEW Date: 12/04/22  Requested Biopsy site: Left supraclavicular LN Reason for request: Possible malignancy Imaging review: Best seen on CT Chest image 12, series 2  Decision: Approved Imaging modality to perform: Ultrasound Schedule with: Patient preference (Local vs Mod Sed) Schedule for: Any VIR  Additional comments: None  Please contact me with questions, concerns, or if issue pertaining to this request arise.  Al Corpus Mir, MD [facility]       Previous Messages    ----- Message ----- From: Caroleen Hamman, NT Sent: 12/03/2022  11:03 AM EST To: Claudean Kinds; Ir Procedure Requests Subject: [place] CORE BIOPSY (LYMPH NODES)                  Procedure: [place] CORE BIOPSY (LYMPH NODES)  Reason: biopsy of left [place] node, confirm malignancy and origin of malignancy Dx: Adenocarcinoma of cecum  History: CT in chart  Provider: Malachy Mood, MD  Contact: [phone]

## 2022-12-09 ENCOUNTER — Other Ambulatory Visit: Payer: Self-pay

## 2022-12-09 ENCOUNTER — Encounter: Payer: Self-pay | Admitting: Emergency Medicine

## 2022-12-09 ENCOUNTER — Other Ambulatory Visit: Payer: Self-pay | Admitting: *Deleted

## 2022-12-09 ENCOUNTER — Ambulatory Visit: Payer: Medicare HMO | Admitting: Emergency Medicine

## 2022-12-09 VITALS — BP 144/68 | HR 87 | Temp 97.9°F | Ht 69.0 in | Wt 171.2 lb

## 2022-12-09 DIAGNOSIS — I10 Essential (primary) hypertension: Secondary | ICD-10-CM

## 2022-12-09 DIAGNOSIS — F418 Other specified anxiety disorders: Secondary | ICD-10-CM | POA: Diagnosis not present

## 2022-12-09 DIAGNOSIS — C3490 Malignant neoplasm of unspecified part of unspecified bronchus or lung: Secondary | ICD-10-CM | POA: Insufficient documentation

## 2022-12-09 DIAGNOSIS — E785 Hyperlipidemia, unspecified: Secondary | ICD-10-CM | POA: Diagnosis not present

## 2022-12-09 DIAGNOSIS — Z7984 Long term (current) use of oral hypoglycemic drugs: Secondary | ICD-10-CM

## 2022-12-09 DIAGNOSIS — F17200 Nicotine dependence, unspecified, uncomplicated: Secondary | ICD-10-CM

## 2022-12-09 DIAGNOSIS — Z1211 Encounter for screening for malignant neoplasm of colon: Secondary | ICD-10-CM | POA: Insufficient documentation

## 2022-12-09 DIAGNOSIS — E039 Hypothyroidism, unspecified: Secondary | ICD-10-CM | POA: Diagnosis not present

## 2022-12-09 DIAGNOSIS — C182 Malignant neoplasm of ascending colon: Secondary | ICD-10-CM | POA: Diagnosis not present

## 2022-12-09 DIAGNOSIS — E1169 Type 2 diabetes mellitus with other specified complication: Secondary | ICD-10-CM

## 2022-12-09 NOTE — Assessment & Plan Note (Signed)
Well-controlled hyper tension Continue valsartan HCT 160-12.5 mg daily Also on metoprolol succinate 25 mg daily

## 2022-12-09 NOTE — Assessment & Plan Note (Signed)
Recently evaluated by oncologist Scheduled for lymph node biopsy Scheduled for PET scan

## 2022-12-09 NOTE — Assessment & Plan Note (Signed)
Recently evaluated by GI doctor and oncologist Resection plan on hold until further staging can be completed

## 2022-12-09 NOTE — Assessment & Plan Note (Signed)
Continues to smoke Has not started NicoDerm patches yet

## 2022-12-09 NOTE — Progress Notes (Signed)
The proposed treatment discussed in conference is for discussion purpose only and is not a binding recommendation.  The patients have not been physically examined, or presented with their treatment options.  Therefore, final treatment plans cannot be decided.  

## 2022-12-09 NOTE — Assessment & Plan Note (Signed)
Chronic but stable Continues BuSpar 7.5 mg twice a day and Lexapro 10 mg daily Uses alprazolam as needed

## 2022-12-09 NOTE — Patient Instructions (Signed)
Health Maintenance After Age 70 After age 70, you are at a higher risk for certain long-term diseases and infections as well as injuries from falls. Falls are a major cause of broken bones and head injuries in people who are older than age 70. Getting regular preventive care can help to keep you healthy and well. Preventive care includes getting regular testing and making lifestyle changes as recommended by your health care provider. Talk with your health care provider about: Which screenings and tests you should have. A screening is a test that checks for a disease when you have no symptoms. A diet and exercise plan that is right for you. What should I know about screenings and tests to prevent falls? Screening and testing are the best ways to find a health problem early. Early diagnosis and treatment give you the best chance of managing medical conditions that are common after age 70. Certain conditions and lifestyle choices may make you more likely to have a fall. Your health care provider may recommend: Regular vision checks. Poor vision and conditions such as cataracts can make you more likely to have a fall. If you wear glasses, make sure to get your prescription updated if your vision changes. Medicine review. Work with your health care provider to regularly review all of the medicines you are taking, including over-the-counter medicines. Ask your health care provider about any side effects that may make you more likely to have a fall. Tell your health care provider if any medicines that you take make you feel dizzy or sleepy. Strength and balance checks. Your health care provider may recommend certain tests to check your strength and balance while standing, walking, or changing positions. Foot health exam. Foot pain and numbness, as well as not wearing proper footwear, can make you more likely to have a fall. Screenings, including: Osteoporosis screening. Osteoporosis is a condition that causes  the bones to get weaker and break more easily. Blood pressure screening. Blood pressure changes and medicines to control blood pressure can make you feel dizzy. Depression screening. You may be more likely to have a fall if you have a fear of falling, feel depressed, or feel unable to do activities that you used to do. Alcohol use screening. Using too much alcohol can affect your balance and may make you more likely to have a fall. Follow these instructions at home: Lifestyle Do not drink alcohol if: Your health care provider tells you not to drink. If you drink alcohol: Limit how much you have to: 0-1 drink a day for women. 0-2 drinks a day for men. Know how much alcohol is in your drink. In the [place] one drink equals one 12 oz bottle of beer (355 mL), one 5 oz glass of wine (148 mL), or one 1 oz glass of hard liquor (44 mL). Do not use any products that contain nicotine or tobacco. These products include cigarettes, chewing tobacco, and vaping devices, such as e-cigarettes. If you need help quitting, ask your health care provider. Activity  Follow a regular exercise program to stay fit. This will help you maintain your balance. Ask your health care provider what types of exercise are appropriate for you. If you need a cane or walker, use it as recommended by your health care provider. Wear supportive shoes that have nonskid soles. Safety  Remove any tripping hazards, such as rugs, cords, and clutter. Install safety equipment such as grab bars in bathrooms and safety rails on stairs. Keep rooms and walkways   well-lit. General instructions Talk with your health care provider about your risks for falling. Tell your health care provider if: You fall. Be sure to tell your health care provider about all falls, even ones that seem minor. You feel dizzy, tiredness (fatigue), or off-balance. Take over-the-counter and prescription medicines only as told by your health care provider. These include  supplements. Eat a healthy diet and maintain a healthy weight. A healthy diet includes low-fat dairy products, low-fat (lean) meats, and fiber from whole grains, beans, and lots of fruits and vegetables. Stay current with your vaccines. Schedule regular health, dental, and eye exams. Summary Having a healthy lifestyle and getting preventive care can help to protect your health and wellness after age 70. Screening and testing are the best way to find a health problem early and help you avoid having a fall. Early diagnosis and treatment give you the best chance for managing medical conditions that are more common for people who are older than age 70. Falls are a major cause of broken bones and head injuries in people who are older than age 70. Take precautions to prevent a fall at home. Work with your health care provider to learn what changes you can make to improve your health and wellness and to prevent falls. This information is not intended to replace advice given to you by your health care provider. Make sure you discuss any questions you have with your health care provider. Document Revised: 06/03/2020 Document Reviewed: 06/03/2020 Elsevier Patient Education  2024 Elsevier Inc.  

## 2022-12-09 NOTE — Progress Notes (Signed)
Patient was contacted by Donna Christen about IR Biopsy and PET Scan she stated she wanted to wait. Dr. Mosetta Putt tried to contact patient at all numbers in patient file and was not able to speak to anyone. Dr. Rolla Etienne office was able to contact patient she scheduled her Biopsy for 12/05.

## 2022-12-09 NOTE — Assessment & Plan Note (Signed)
Clinically euthyroid.  Continue levothyroxine 125 mcg daily.

## 2022-12-09 NOTE — Assessment & Plan Note (Signed)
Chronic stable conditions Continue metformin 1000 mg twice a day and atorvastatin 20 mg daily

## 2022-12-09 NOTE — Progress Notes (Signed)
Jasmine Long 72 y.o.   Chief Complaint  Patient presents with   CT results    Go over CT results.     HISTORY OF PRESENT ILLNESS: This is a 72 y.o. 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 CT scan of chest revealed new mass and adenopathy raising possibility of metastatic disease Recently evaluated by oncologist.  Plan is to biopsy lymph nodes, get a PET scan.  Treatment depending on these results. Patient has chronic anxiety on multiple medications.  Will be managing this closely.  HPI   Prior to Admission medications   Medication Sig Start Date End Date Taking? Authorizing Provider  ALPRAZolam Prudy Feeler) 0.5 MG tablet Take 1 tablet (0.5 mg total) by mouth 2 (two) times daily as needed for anxiety. 11/07/22  Yes Eldwin Volkov, Eilleen Kempf, MD  atorvastatin (LIPITOR) 20 MG tablet Take 1 tablet by mouth once daily 08/18/22  Yes Chaquetta Schlottman, Eilleen Kempf, MD  buPROPion (WELLBUTRIN XL) 300 MG 24 hr tablet Take 1 tablet by mouth once daily 08/18/22  Yes Dnaiel Voller, Eilleen Kempf, MD  busPIRone (BUSPAR) 7.5 MG tablet Take 1 tablet (7.5 mg total) by mouth 2 (two) times daily. 07/16/22  Yes Heman Que, Eilleen Kempf, MD  escitalopram (LEXAPRO) 10 MG tablet Take 1 tablet (10 mg total) by mouth daily. 12/25/21 12/20/22 Yes Johnathan Tortorelli, Eilleen Kempf, MD  gabapentin (NEURONTIN) 300 MG capsule Take 300 mg by mouth as needed (numbness). 08/14/22  Yes [provider]  levothyroxine (SYNTHROID) 125 MCG tablet TAKE 1 TABLET BY MOUTH ONCE DAILY BEFORE BREAKFAST 04/14/22  Yes Caprina Wussow, Eilleen Kempf, MD  metFORMIN (GLUCOPHAGE) 1000 MG tablet TAKE 1 TABLET BY MOUTH TWICE DAILY WITH A MEAL 08/18/22  Yes Branae Crail, Eilleen Kempf, MD  metoprolol succinate (TOPROL-XL) 25 MG 24 hr tablet Take 1 tablet by mouth once daily 05/27/22  Yes Julicia Krieger, [place], MD  nicotine (NICODERM CQ) 21 mg/24hr patch Place 1 patch (21 mg total) onto the  skin daily. 11/26/22  Yes [name], MD  pantoprazole (PROTONIX) 40 MG tablet Take 1 tablet twice a day for 2 weeks and then daily after that 08/08/22  Yes Shahida Schnackenberg, Eilleen Kempf, MD  valsartan-hydrochlorothiazide (DIOVAN-HCT) 160-12.5 MG tablet Take 1 tablet by mouth once daily 05/27/22  Yes Richerd Grime, Eilleen Kempf, MD    No Known Allergies  Patient Active Problem List   Diagnosis Date Noted   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   Weight loss 05/13/2022   Change in multiple pigmented skin lesions 04/22/2022   Paresthesia of both lower extremities 04/22/2022   Abnormal MRI, lumbar spine 04/22/2022   Vitreous floaters of right eye 04/07/2022   Hematoma of scalp 12/25/2021   Smokers' cough (HCC) 12/26/2020   Chronic bilateral low back pain with left-sided sciatica 12/26/2020   Chronic sinus complaints 12/26/2020   Chronic anxiety 06/28/2019   History of peptic ulcer 04/30/2017   Hyperlipidemia 12/11/2015   Insomnia 12/11/2015   Panic attack as reaction to stress 12/11/2015   Current smoker 12/11/2015   Chronic sinusitis 12/11/2015   Dyslipidemia associated with type 2 diabetes mellitus (HCC) 05/15/2015   Hypothyroid 04/29/2011   BMI 40.0-44.9, adult (HCC) 04/29/2011   Depression with anxiety 04/29/2011   GERD (gastroesophageal reflux disease) 04/29/2011   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   Nicotine addiction 04/29/2011   Essential hypertension 04/29/2011   Heart murmur 04/29/2011    Past Medical History:  Diagnosis Date  Allergy    Anxiety    Depression    Heart murmur    Hypertension    Thyroid disease    Ulcer     Past Surgical History:  Procedure Laterality Date   BREAST SURGERY     CESAREAN SECTION     CHOLECYSTECTOMY     COLONOSCOPY WITH PROPOFOL N/A 10/30/2022   Procedure: COLONOSCOPY WITH PROPOFOL;  Surgeon: Jeani Hawking, MD;  Location: [facility];  Service: Gastroenterology;  Laterality: N/A;   ENDOSCOPIC MUCOSAL RESECTION  10/30/2022    Procedure: ENDOSCOPIC MUCOSAL RESECTION;  Surgeon: Jeani Hawking, MD;  Location: [facility];  Service: Gastroenterology;;   HEMOSTASIS CLIP PLACEMENT  10/30/2022   Procedure: HEMOSTASIS CLIP PLACEMENT;  Surgeon: Jeani Hawking, MD;  Location: [facility];  Service: Gastroenterology;;   NASAL SEPTUM SURGERY     POLYPECTOMY  10/30/2022   Procedure: POLYPECTOMY;  Surgeon: Jeani Hawking, MD;  Location: [facility];  Service: Gastroenterology;;   Sunnie Nielsen LIFTING INJECTION  10/30/2022   Procedure: SUBMUCOSAL LIFTING INJECTION;  Surgeon: Jeani Hawking, MD;  Location: [facility];  Service: Gastroenterology;;    Social History   Socioeconomic History   Marital status: Divorced    Spouse name: Not on file   Number of children: 2   Years of education: Not on file   Highest education level: Not on file  Occupational History   Occupation: Unemployed  Tobacco Use   Smoking status: Every Day    Current packs/day: 0.00    Average packs/day: 1.5 packs/day for 40.0 years (60.0 ttl pk-yrs)    Types: Cigarettes    Start date: 01/20/1972    Last attempt to quit: 01/20/2012    Years since quitting: 10.8   Smokeless tobacco: Never   Tobacco comments:    Pt doing E-cigarette as of 01/20/12  Vaping Use   Vaping status: Former  Substance and Sexual Activity   Alcohol use: No   Drug use: No   Sexual activity: Never  Other Topics Concern   Not on file  Social History Narrative   Patient on worker's compensation for an injury sustained on the job 5 years ago.      Right handed and Left Handed       Lives in a two story home       One son deceased.    Social Determinants of Health   Financial Resource Strain: Low Risk  (09/03/2022)   Overall Financial Resource Strain (CARDIA)    Difficulty of Paying Living Expenses: Not hard at all  Food Insecurity: No Food Insecurity (09/03/2022)   Hunger Vital Sign    Worried About Running Out of Food in the Last Year: Never true    Ran Out of Food in  the Last Year: Never true  Transportation Needs: No Transportation Needs (09/03/2022)   PRAPARE - Administrator, Civil Service (Medical): No    Lack of Transportation (Non-Medical): No  Physical Activity: Inactive (09/03/2022)   Exercise Vital Sign    Days of Exercise per Week: 0 days    Minutes of Exercise per Session: 0 min  Stress: No Stress Concern Present (09/03/2022)   [employer] of Occupational Health - Occupational Stress Questionnaire    Feeling of Stress : Not at all  Social Connections: Socially Isolated (09/03/2022)   Social Connection and Isolation Panel [NHANES]    Frequency of Communication with Friends and Family: More than three times a week    Frequency of Social Gatherings with Friends and  Family: More than three times a week    Attends Religious Services: Never    Active Member of Clubs or Organizations: No    Attends Banker Meetings: Never    Marital Status: Divorced  Catering manager Violence: Not At Risk (09/03/2022)   Humiliation, Afraid, Rape, and Kick questionnaire    Fear of Current or Ex-Partner: No    Emotionally Abused: No    Physically Abused: No    Sexually Abused: No    Family History  Problem Relation Age of Onset   Lung cancer Mother 33   Dementia Father    Atrial fibrillation Father    Cancer Paternal Aunt        breast cancer   Colon cancer Paternal Aunt    Colon cancer Cousin      Review of Systems  Constitutional: Negative.  Negative for chills and fever.  HENT: Negative.  Negative for congestion and sore throat.   Respiratory: Negative.  Negative for cough and shortness of breath.   Cardiovascular: Negative.  Negative for chest pain and palpitations.  Gastrointestinal:  Negative for abdominal pain, diarrhea, nausea and vomiting.  Genitourinary: Negative.  Negative for dysuria and hematuria.  Skin: Negative.  Negative for rash.  Neurological: Negative.  Negative for dizziness and headaches.   Psychiatric/Behavioral:  The patient is nervous/anxious.   All other systems reviewed and are negative.   Vitals:   12/09/22 1110  BP: (!) 144/68  Pulse: 87  Temp: 97.9 F (36.6 C)  SpO2: 98%    Physical Exam Vitals reviewed.  Constitutional:      Appearance: Normal appearance.  HENT:     Head: Normocephalic.  Eyes:     Extraocular Movements: Extraocular movements intact.  Cardiovascular:     Rate and Rhythm: Normal rate.  Pulmonary:     Effort: Pulmonary effort is normal.  Skin:    General: Skin is warm and dry.  Neurological:     Mental Status: She is alert and oriented to person, place, and time.  Psychiatric:        Mood and Affect: Mood normal.        Behavior: Behavior normal.      ASSESSMENT & PLAN: A total of 44 minutes was spent with the patient and counseling/coordination of care regarding preparing for this visit, review of most recent office visit notes, review of most recent oncologist office visit notes, review of multiple chronic medical conditions under management, review of all medications, treatment of chronic anxiety, review of most recent blood work results, prognosis, documentation, and need for follow-up.   Problem List Items Addressed This Visit       Cardiovascular and Mediastinum   Essential hypertension    Well-controlled hyper tension Continue valsartan HCT 160-12.5 mg daily Also on metoprolol succinate 25 mg daily          Respiratory   Malignant neoplasm of lung (HCC)    Recently evaluated by oncologist Scheduled for lymph node biopsy Scheduled for PET scan        Digestive   Malignant neoplasm of ascending colon (HCC) - Primary    Recently evaluated by GI doctor and oncologist Resection plan on hold until further staging can be completed        Endocrine   Hypothyroid    Clinically euthyroid Continue levothyroxine 125 mcg daily      Dyslipidemia associated with type 2 diabetes mellitus (HCC)    Chronic stable  conditions Continue metformin 1000 mg  twice a day and atorvastatin 20 mg daily          Other   Depression with anxiety    Chronic but stable Continues BuSpar 7.5 mg twice a day and Lexapro 10 mg daily Uses alprazolam as needed      Current smoker    Continues to smoke Has not started NicoDerm patches yet      Patient Instructions  Health Maintenance After Age 66 After age 69, you are at a higher risk for certain long-term diseases and infections as well as injuries from falls. Falls are a major cause of broken bones and head injuries in people who are older than age 20. Getting regular preventive care can help to keep you healthy and well. Preventive care includes getting regular testing and making lifestyle changes as recommended by your health care provider. Talk with your health care provider about: Which screenings and tests you should have. A screening is a test that checks for a disease when you have no symptoms. A diet and exercise plan that is right for you. What should I know about screenings and tests to prevent falls? Screening and testing are the best ways to find a health problem early. Early diagnosis and treatment give you the best chance of managing medical conditions that are common after age 32. Certain conditions and lifestyle choices may make you more likely to have a fall. Your health care provider may recommend: Regular vision checks. Poor vision and conditions such as cataracts can make you more likely to have a fall. If you wear glasses, make sure to get your prescription updated if your vision changes. Medicine review. Work with your health care provider to regularly review all of the medicines you are taking, including over-the-counter medicines. Ask your health care provider about any side effects that may make you more likely to have a fall. Tell your health care provider if any medicines that you take make you feel dizzy or sleepy. Strength and balance  checks. Your health care provider may recommend certain tests to check your strength and balance while standing, walking, or changing positions. Foot health exam. Foot pain and numbness, as well as not wearing proper footwear, can make you more likely to have a fall. Screenings, including: Osteoporosis screening. Osteoporosis is a condition that causes the bones to get weaker and break more easily. Blood pressure screening. Blood pressure changes and medicines to control blood pressure can make you feel dizzy. Depression screening. You may be more likely to have a fall if you have a fear of falling, feel depressed, or feel unable to do activities that you used to do. Alcohol use screening. Using too much alcohol can affect your balance and may make you more likely to have a fall. Follow these instructions at home: Lifestyle Do not drink alcohol if: Your health care provider tells you not to drink. If you drink alcohol: Limit how much you have to: 0-1 drink a day for women. 0-2 drinks a day for men. Know how much alcohol is in your drink. In the [place] one drink equals one 12 oz bottle of beer (355 mL), one 5 oz glass of wine (148 mL), or one 1 oz glass of hard liquor (44 mL). Do not use any products that contain nicotine or tobacco. These products include cigarettes, chewing tobacco, and vaping devices, such as e-cigarettes. If you need help quitting, ask your health care provider. Activity  Follow a regular exercise program to stay fit. This  will help you maintain your balance. Ask your health care provider what types of exercise are appropriate for you. If you need a cane or walker, use it as recommended by your health care provider. Wear supportive shoes that have nonskid soles. Safety  Remove any tripping hazards, such as rugs, cords, and clutter. Install safety equipment such as grab bars in bathrooms and safety rails on stairs. Keep rooms and walkways well-lit. General  instructions Talk with your health care provider about your risks for falling. Tell your health care provider if: You fall. Be sure to tell your health care provider about all falls, even ones that seem minor. You feel dizzy, tiredness (fatigue), or off-balance. Take over-the-counter and prescription medicines only as told by your health care provider. These include supplements. Eat a healthy diet and maintain a healthy weight. A healthy diet includes low-fat dairy products, low-fat (lean) meats, and fiber from whole grains, beans, and lots of fruits and vegetables. Stay current with your vaccines. Schedule regular health, dental, and eye exams. Summary Having a healthy lifestyle and getting preventive care can help to protect your health and wellness after age 83. Screening and testing are the best way to find a health problem early and help you avoid having a fall. Early diagnosis and treatment give you the best chance for managing medical conditions that are more common for people who are older than age 13. Falls are a major cause of broken bones and head injuries in people who are older than age 73. Take precautions to prevent a fall at home. Work with your health care provider to learn what changes you can make to improve your health and wellness and to prevent falls. This information is not intended to replace advice given to you by your health care provider. Make sure you discuss any questions you have with your health care provider. Document Revised: 06/03/2020 Document Reviewed: 06/03/2020 Elsevier Patient Education  2024 Elsevier Inc.    Edwina Barth, MD [facility]

## 2022-12-28 ENCOUNTER — Encounter (HOSPITAL_COMMUNITY)
Admission: RE | Admit: 2022-12-28 | Discharge: 2022-12-28 | Disposition: A | Discharge status: Home / Self Care | Payer: Medicare HMO | Source: Ambulatory Visit | Attending: Hematology | Admitting: Hematology

## 2022-12-28 DIAGNOSIS — C180 Malignant neoplasm of cecum: Secondary | ICD-10-CM | POA: Insufficient documentation

## 2022-12-28 MED ORDER — FLUDEOXYGLUCOSE F - 18 (FDG) INJECTION
9.0000 | Freq: Once | INTRAVENOUS | Status: DC | PRN
Start: 2022-12-28 — End: 2023-01-03

## 2022-12-30 ENCOUNTER — Other Ambulatory Visit: Payer: Self-pay | Admitting: Radiology

## 2022-12-30 DIAGNOSIS — C180 Malignant neoplasm of cecum: Secondary | ICD-10-CM

## 2022-12-30 NOTE — Progress Notes (Signed)
Chief Complaint: Patient was seen in consultation today for image guided left supraclavicular lymph node biopsy  Referring Physician(s): Feng,Yan  Supervising Physician: Marliss Coots  Patient Status: [facility] - Out-pt  History of Present Illness: Jasmine Long is a 72 y.o. female smoker with past medical history significant for anxiety/depression, heart murmur, hyperlipidemia, hypertension, hypothyroidism who presents now with newly diagnosed cecal adenocarcinoma and lung mass along with mediastinal/supraclavicular adenopathy.  She is scheduled today for image guided left supraclavicular lymph node biopsy for further evaluation.  Past Medical History:  Diagnosis Date   Allergy    Anxiety    Depression    Heart murmur    Hypertension    Thyroid disease    Ulcer     Past Surgical History:  Procedure Laterality Date   BREAST SURGERY     CESAREAN SECTION     CHOLECYSTECTOMY     COLONOSCOPY WITH PROPOFOL N/A 10/30/2022   Procedure: COLONOSCOPY WITH PROPOFOL;  Surgeon: Jeani Hawking, MD;  Location: [facility];  Service: Gastroenterology;  Laterality: N/A;   ENDOSCOPIC MUCOSAL RESECTION  10/30/2022   Procedure: ENDOSCOPIC MUCOSAL RESECTION;  Surgeon: Jeani Hawking, MD;  Location: [facility];  Service: Gastroenterology;;   HEMOSTASIS CLIP PLACEMENT  10/30/2022   Procedure: HEMOSTASIS CLIP PLACEMENT;  Surgeon: Jeani Hawking, MD;  Location: [facility];  Service: Gastroenterology;;   NASAL SEPTUM SURGERY     POLYPECTOMY  10/30/2022   Procedure: POLYPECTOMY;  Surgeon: Jeani Hawking, MD;  Location: [facility];  Service: Gastroenterology;;   Sunnie Nielsen LIFTING INJECTION  10/30/2022   Procedure: SUBMUCOSAL LIFTING INJECTION;  Surgeon: Jeani Hawking, MD;  Location: [facility];  Service: Gastroenterology;;    Allergies: Patient has no known allergies.  Medications: Prior to Admission medications   Medication Sig Start Date End Date Taking? Authorizing Provider  ALPRAZolam Prudy Feeler)  0.5 MG tablet Take 1 tablet (0.5 mg total) by mouth 2 (two) times daily as needed for anxiety. 11/07/22   [name], MD  atorvastatin (LIPITOR) 20 MG tablet Take 1 tablet by mouth once daily 08/18/22   [name], MD  buPROPion (WELLBUTRIN XL) 300 MG 24 hr tablet Take 1 tablet by mouth once daily 08/18/22   [name], MD  busPIRone (BUSPAR) 7.5 MG tablet Take 1 tablet (7.5 mg total) by mouth 2 (two) times daily. 07/16/22   [name], MD  escitalopram (LEXAPRO) 10 MG tablet Take 1 tablet (10 mg total) by mouth daily. 12/25/21 12/20/22  [name], MD  gabapentin (NEURONTIN) 300 MG capsule Take 300 mg by mouth as needed (numbness). 08/14/22   [provider]  levothyroxine (SYNTHROID) 125 MCG tablet TAKE 1 TABLET BY MOUTH ONCE DAILY BEFORE BREAKFAST 04/14/22   [name], MD  metFORMIN (GLUCOPHAGE) 1000 MG tablet TAKE 1 TABLET BY MOUTH TWICE DAILY WITH A MEAL 08/18/22   [name], MD  metoprolol succinate (TOPROL-XL) 25 MG 24 hr tablet Take 1 tablet by mouth once daily 05/27/22   Sagardia, Eilleen Kempf, MD  nicotine (NICODERM CQ) 21 mg/24hr patch Place 1 patch (21 mg total) onto the skin daily. 11/26/22   [name], MD  pantoprazole (PROTONIX) 40 MG tablet Take 1 tablet twice a day for 2 weeks and then daily after that 08/08/22   [name], MD  valsartan-hydrochlorothiazide (DIOVAN-HCT) 160-12.5 MG tablet Take 1 tablet by mouth once daily 05/27/22   [name], MD     Family History  Problem Relation Age of  Onset   Lung cancer Mother 57   Dementia Father    Atrial fibrillation Father    Cancer Paternal Aunt        breast cancer   Colon cancer Paternal Aunt    Colon cancer Cousin     Social History   Socioeconomic History   Marital status: Divorced    Spouse name: Not on file   Number of children: 2   Years of education: Not on file   Highest education level: Not on file   Occupational History   Occupation: Unemployed  Tobacco Use   Smoking status: Every Day    Current packs/day: 0.00    Average packs/day: 1.5 packs/day for 40.0 years (60.0 ttl pk-yrs)    Types: Cigarettes    Start date: 01/20/1972    Last attempt to quit: 01/20/2012    Years since quitting: 10.9   Smokeless tobacco: Never   Tobacco comments:    Pt doing E-cigarette as of 01/20/12  Vaping Use   Vaping status: Former  Substance and Sexual Activity   Alcohol use: No   Drug use: No   Sexual activity: Never  Other Topics Concern   Not on file  Social History Narrative   Patient on worker's compensation for an injury sustained on the job 5 years ago.      Right handed and Left Handed       Lives in a two story home       One son deceased.    Social Determinants of Health   Financial Resource Strain: Low Risk  (09/03/2022)   Overall Financial Resource Strain (CARDIA)    Difficulty of Paying Living Expenses: Not hard at all  Food Insecurity: No Food Insecurity (09/03/2022)   Hunger Vital Sign    Worried About Running Out of Food in the Last Year: Never true    Ran Out of Food in the Last Year: Never true  Transportation Needs: No Transportation Needs (09/03/2022)   PRAPARE - Administrator, Civil Service (Medical): No    Lack of Transportation (Non-Medical): No  Physical Activity: Inactive (09/03/2022)   Exercise Vital Sign    Days of Exercise per Week: 0 days    Minutes of Exercise per Session: 0 min  Stress: No Stress Concern Present (09/03/2022)   [employer] of Occupational Health - Occupational Stress Questionnaire    Feeling of Stress : Not at all  Social Connections: Socially Isolated (09/03/2022)   Social Connection and Isolation Panel [NHANES]    Frequency of Communication with Friends and Family: More than three times a week    Frequency of Social Gatherings with Friends and Family: More than three times a week    Attends Religious Services: Never     Database administrator or Organizations: No    Attends Engineer, structural: Never    Marital Status: Divorced      Review of Systems: denies fever,HA,CP, back pain,N/V or bleeding; she does have occ cough, some dyspnea with exertion, lower abd discomfort and is anxious  Vital Signs: Vitals:   12/31/22 1117  BP: (!) 153/66  Pulse: 93  Resp: 16  Temp: 97.8 F (36.6 C)  SpO2: 100%      Code Status: FULL CODE  Advance Care Plan: No documents on file   Physical Exam: awake/alert; chest- distant BS bilat; heart- RRR,+murmur; abd-soft,+BS,mildly tender lower abd region; no LE edema  Imaging: No results found.  Labs:  CBC: Recent  Labs    05/13/22 1459 09/11/22 1259 10/13/22 1543 12/02/22 1614  WBC 12.1* 8.2 11.0* 6.6  HGB 14.3 14.2 14.0 13.5  HCT 42.8 42.8 42.2 40.2  PLT 426.0* 410 412* 411*    COAGS: No results for input(s): "INR", "APTT" in the last 8760 hours.  BMP: Recent Labs    05/04/22 1158 05/13/22 1459 09/11/22 1259 10/13/22 1543 12/02/22 1614  NA 133* 135 134 134* 134*  K 3.8 4.1 4.0 4.6 4.0  CL 99 99 98 101 100  CO2 24 25 19* 21* 26  GLUCOSE 141* 104* 101* 96 100*  BUN 14 17 12  7* 11  CALCIUM 9.5 10.0 9.7 9.2 10.0  CREATININE 1.02* 1.03 1.02* 0.78 0.87  GFRNONAA 59*  --   --  >60 >60    LIVER FUNCTION TESTS: Recent Labs    05/13/22 1459 09/11/22 1259 10/13/22 1543 12/02/22 1614  BILITOT 0.6 0.8 1.6* 1.0  AST 14 19 18  12*  ALT 12 14 6 7   ALKPHOS 98 90 73 83  PROT 7.8 7.3 7.1 7.5  ALBUMIN 4.6 4.4 4.0 4.2    TUMOR MARKERS: No results for input(s): "AFPTM", "CEA", "CA199", "CHROMGRNA" in the last 8760 hours.  Assessment and Plan: 72 y.o. female smoker with past medical history significant for anxiety/depression, heart murmur, hyperlipidemia, hypertension, hypothyroidism who presents now with newly diagnosed cecal adenocarcinoma and lung mass along with mediastinal/supraclavicular adenopathy.  She is scheduled today for  image guided left supraclavicular lymph node biopsy for further evaluation.Risks and benefits of procedure was discussed with the patient  including, but not limited to bleeding, infection, damage to adjacent structures or low yield requiring additional tests.  All of the questions were answered and there is agreement to proceed.  Consent signed and in chart.    Thank you for this interesting consult.  I greatly enjoyed meeting Jasmine Long and look forward to participating in their care.  A copy of this report was sent to the requesting provider on this date.  Electronically Signed: D. Jeananne Rama, PA-C 12/30/2022, 1:11 PM   I spent a total of  20 minutes   in face to face in clinical consultation, greater than 50% of which was counseling/coordinating care for image guided left supraclavicular lymph node biopsy

## 2022-12-31 ENCOUNTER — Other Ambulatory Visit: Payer: Self-pay

## 2022-12-31 ENCOUNTER — Encounter (HOSPITAL_COMMUNITY): Payer: Self-pay

## 2022-12-31 ENCOUNTER — Ambulatory Visit (HOSPITAL_COMMUNITY)
Admission: RE | Admit: 2022-12-31 | Discharge: 2022-12-31 | Disposition: A | Discharge status: Home / Self Care | Payer: Medicare HMO | Source: Ambulatory Visit | Attending: Hematology | Admitting: Hematology

## 2022-12-31 ENCOUNTER — Ambulatory Visit (HOSPITAL_COMMUNITY): Payer: Medicare HMO

## 2022-12-31 DIAGNOSIS — F17210 Nicotine dependence, cigarettes, uncomplicated: Secondary | ICD-10-CM | POA: Diagnosis not present

## 2022-12-31 DIAGNOSIS — C180 Malignant neoplasm of cecum: Secondary | ICD-10-CM | POA: Diagnosis not present

## 2022-12-31 DIAGNOSIS — R590 Localized enlarged lymph nodes: Secondary | ICD-10-CM | POA: Insufficient documentation

## 2022-12-31 DIAGNOSIS — E039 Hypothyroidism, unspecified: Secondary | ICD-10-CM | POA: Diagnosis not present

## 2022-12-31 DIAGNOSIS — R011 Cardiac murmur, unspecified: Secondary | ICD-10-CM | POA: Insufficient documentation

## 2022-12-31 DIAGNOSIS — R918 Other nonspecific abnormal finding of lung field: Secondary | ICD-10-CM | POA: Diagnosis present

## 2022-12-31 DIAGNOSIS — E785 Hyperlipidemia, unspecified: Secondary | ICD-10-CM | POA: Insufficient documentation

## 2022-12-31 DIAGNOSIS — C7A1 Malignant poorly differentiated neuroendocrine tumors: Secondary | ICD-10-CM | POA: Diagnosis not present

## 2022-12-31 DIAGNOSIS — R599 Enlarged lymph nodes, unspecified: Secondary | ICD-10-CM | POA: Diagnosis not present

## 2022-12-31 DIAGNOSIS — C7B8 Other secondary neuroendocrine tumors: Secondary | ICD-10-CM | POA: Diagnosis not present

## 2022-12-31 DIAGNOSIS — Z79899 Other long term (current) drug therapy: Secondary | ICD-10-CM | POA: Insufficient documentation

## 2022-12-31 DIAGNOSIS — I10 Essential (primary) hypertension: Secondary | ICD-10-CM | POA: Diagnosis not present

## 2022-12-31 HISTORY — DX: Type 2 diabetes mellitus without complications: E11.9

## 2022-12-31 LAB — CBC WITH DIFFERENTIAL/PLATELET
Abs Immature Granulocytes: 0.02 10*3/uL (ref 0.00–0.07)
Basophils Absolute: 0 10*3/uL (ref 0.0–0.1)
Basophils Relative: 0 %
Eosinophils Absolute: 0 10*3/uL (ref 0.0–0.5)
Eosinophils Relative: 0 %
HCT: 39.3 % (ref 36.0–46.0)
Hemoglobin: 12.9 g/dL (ref 12.0–15.0)
Immature Granulocytes: 0 %
Lymphocytes Relative: 29 %
Lymphs Abs: 2.1 10*3/uL (ref 0.7–4.0)
MCH: 28.9 pg (ref 26.0–34.0)
MCHC: 32.8 g/dL (ref 30.0–36.0)
MCV: 88.1 fL (ref 80.0–100.0)
Monocytes Absolute: 0.6 10*3/uL (ref 0.1–1.0)
Monocytes Relative: 8 %
Neutro Abs: 4.5 10*3/uL (ref 1.7–7.7)
Neutrophils Relative %: 63 %
Platelets: 374 10*3/uL (ref 150–400)
RBC: 4.46 MIL/uL (ref 3.87–5.11)
RDW: 13.8 % (ref 11.5–15.5)
WBC: 7.2 10*3/uL (ref 4.0–10.5)
nRBC: 0 % (ref 0.0–0.2)

## 2022-12-31 LAB — GLUCOSE, CAPILLARY: Glucose-Capillary: 96 mg/dL (ref 70–99)

## 2022-12-31 LAB — PROTIME-INR
INR: 1.1 (ref 0.8–1.2)
Prothrombin Time: 14.2 s (ref 11.4–15.2)

## 2022-12-31 MED ORDER — LIDOCAINE HCL 1 % IJ SOLN
INTRAMUSCULAR | Status: AC
  Filled 2022-12-31: qty 20

## 2022-12-31 MED ORDER — SODIUM CHLORIDE 0.9 % IV SOLN: INTRAVENOUS | Status: DC

## 2022-12-31 MED ORDER — FENTANYL CITRATE (PF) 100 MCG/2ML IJ SOLN
INTRAMUSCULAR | Status: AC
  Filled 2022-12-31: qty 2

## 2022-12-31 MED ORDER — FENTANYL CITRATE (PF) 100 MCG/2ML IJ SOLN
INTRAMUSCULAR | Status: AC | PRN
  Administered 2022-12-31: 25 ug via INTRAVENOUS

## 2022-12-31 MED ORDER — MIDAZOLAM HCL 2 MG/2ML IJ SOLN
INTRAMUSCULAR | Status: AC | PRN
  Administered 2022-12-31: 1 mg via INTRAVENOUS

## 2022-12-31 MED ORDER — MIDAZOLAM HCL 2 MG/2ML IJ SOLN
INTRAMUSCULAR | Status: AC
  Filled 2022-12-31: qty 2

## 2022-12-31 NOTE — Procedures (Signed)
Interventional Radiology Procedure Note  Procedure: Ultrasound guided left supraclavicular lymph node biopsy  Findings: Please refer to procedural dictation for full description. 18 ga core x2.  Complications: None immediate  Estimated Blood Loss: < 5 mL  Recommendations: 1 hour bedrest Follow pathology results   Marliss Coots, MD

## 2022-12-31 NOTE — Discharge Instructions (Addendum)
Discharge Instructions:   Please call Interventional Radiology clinic [phone] with any questions or concerns.  You may remove your dressing and shower tomorrow.    Moderate Conscious Sedation, Adult, Care After This sheet gives you information about how to care for yourself after your procedure. Your health care provider may also give you more specific instructions. If you have problems or questions, contact your health care provider. What can I expect after the procedure? After the procedure, it is common to have: Sleepiness for several hours. Impaired judgment for several hours. Difficulty with balance. Vomiting if you eat too soon. Follow these instructions at home: For the time period you were told by your health care provider:   Rest. Do not participate in activities where you could fall or become injured. Do not drive or use machinery. Do not drink alcohol. Do not take sleeping pills or medicines that cause drowsiness. Do not make important decisions or sign legal documents. Do not take care of children on your own. Eating and drinking  Follow the diet recommended by your health care provider. Drink enough fluid to keep your urine pale yellow. If you vomit: Drink water, juice, or soup when you can drink without vomiting. Make sure you have little or no nausea before eating solid foods. General instructions Take over-the-counter and prescription medicines only as told by your health care provider. Have a responsible adult stay with you for the time you are told. It is important to have someone help care for you until you are awake and alert. Do not smoke. Keep all follow-up visits as told by your health care provider. This is important. Contact a health care provider if: You are still sleepy or having trouble with balance after 24 hours. You feel light-headed. You keep feeling nauseous or you keep vomiting. You develop a rash. You have a fever. You have redness  or swelling around the IV site. Get help right away if: You have trouble breathing. You have new-onset confusion at home. Summary After the procedure, it is common to feel sleepy, have impaired judgment, or feel nauseous if you eat too soon. Rest after you get home. Know the things you should not do after the procedure. Follow the diet recommended by your health care provider and drink enough fluid to keep your urine pale yellow. Get help right away if you have trouble breathing or new-onset confusion at home. This information is not intended to replace advice given to you by your health care provider. Make sure you discuss any questions you have with your health care provider. Document Revised: 05/12/2019 Document Reviewed: 12/08/2018 Elsevier Patient Education  2023 Elsevier Inc.    Needle Biopsy, Care After The following information offers guidance on how to care for yourself after your procedure. Your health care provider may also give you more specific instructions. If you have problems or questions, contact your health care provider. What can I expect after the procedure? After the procedure, it is common to have: Soreness, pain, and tenderness where a tissue sample was taken (biopsy site). Bruising or mild pain at the biopsy site. These symptoms should go away after a few days. Follow these instructions at home: Biopsy site care  Follow instructions from your health care provider about how to take care of your biopsy site. Make sure you: Wash your hands with soap and water for at least 20 seconds before and after you change your bandage (dressing). If soap and water are not available, use hand sanitizer. Know   when and how to change your dressing. Know when to remove your dressing. Check your puncture site every day for signs of infection. Check for: More redness, swelling, or pain. More drainage of fluid or blood. More warmth. Pus or a bad smell. General instructions Rest as  told by your health care provider. Do not take baths, swim, or use a hot tub until your health care provider approves. Ask your health care provider if you may take showers. You may only be allowed to take sponge baths. Take over-the-counter and prescription medicines only as told by your health care provider. Return to your normal activities as told by your health care provider. Ask your health care provider what activities are safe for you. If you have airplane travel scheduled, talk with your health care provider about when it is safe for you to travel by airplane. This is specific to certain biopsy procedures. It is up to you to get the results of your procedure. Ask your health care provider, or the department that is doing the procedure, when your results will be ready. Keep all follow-up visits. You may need to make an appointment to get your biopsy results. Contact a health care provider if: You have a fever. You have more redness, swelling, or pain at the puncture site that lasts longer than a few days. You have more fluid or blood coming from your puncture site. You have pus or a bad smell coming from your puncture site. Your puncture site feels warm to the touch. You have pain that does not get better with medicine. Get help right away if: You have severe bleeding from the puncture site. You have chest pain. You have problems breathing. You cough up blood. You faint. You have a very fast heart rate. These symptoms may be an emergency. Get help right away. Call 911. Do not wait to see if the symptoms will go away. Do not drive yourself to the hospital. Summary After the procedure, it is common to have soreness, bruising, tenderness, or mild pain at the biopsy site. These symptoms should go away in a few days. Check your biopsy site every day for signs of infection, such as more redness, swelling, or pain. Do not take baths, swim, or use a hot tub until your health care provider  approves. Ask your health care provider if you may take showers. Contact a heath care provider if you have more redness, swelling, or pain at the puncture site that lasts longer than a few days. This information is not intended to replace advice given to you by your health care provider. Make sure you discuss any questions you have with your health care provider. Document Revised: 01/08/2021 Document Reviewed: 01/08/2021 Elsevier Patient Education  2023 Elsevier Inc.  

## 2023-01-01 ENCOUNTER — Ambulatory Visit: Payer: Medicare HMO | Admitting: Cardiology

## 2023-01-04 ENCOUNTER — Telehealth: Payer: Self-pay | Admitting: Hematology

## 2023-01-04 ENCOUNTER — Other Ambulatory Visit: Payer: Self-pay

## 2023-01-05 ENCOUNTER — Telehealth: Payer: Self-pay | Admitting: Hematology

## 2023-01-05 ENCOUNTER — Inpatient Hospital Stay (HOSPITAL_BASED_OUTPATIENT_CLINIC_OR_DEPARTMENT_OTHER): Payer: Medicare HMO | Admitting: Hematology

## 2023-01-05 ENCOUNTER — Telehealth: Payer: Self-pay

## 2023-01-05 ENCOUNTER — Encounter: Payer: Self-pay | Admitting: Hematology

## 2023-01-05 DIAGNOSIS — C7931 Secondary malignant neoplasm of brain: Secondary | ICD-10-CM | POA: Insufficient documentation

## 2023-01-05 DIAGNOSIS — C3490 Malignant neoplasm of unspecified part of unspecified bronchus or lung: Secondary | ICD-10-CM

## 2023-01-05 DIAGNOSIS — F17210 Nicotine dependence, cigarettes, uncomplicated: Secondary | ICD-10-CM | POA: Insufficient documentation

## 2023-01-05 DIAGNOSIS — C3412 Malignant neoplasm of upper lobe, left bronchus or lung: Secondary | ICD-10-CM

## 2023-01-05 DIAGNOSIS — Z5111 Encounter for antineoplastic chemotherapy: Secondary | ICD-10-CM | POA: Insufficient documentation

## 2023-01-05 DIAGNOSIS — C3411 Malignant neoplasm of upper lobe, right bronchus or lung: Secondary | ICD-10-CM | POA: Insufficient documentation

## 2023-01-05 DIAGNOSIS — Z79899 Other long term (current) drug therapy: Secondary | ICD-10-CM | POA: Insufficient documentation

## 2023-01-05 LAB — SURGICAL PATHOLOGY

## 2023-01-05 NOTE — Telephone Encounter (Signed)
Pt LVM stating she spoke with Dr. Mosetta Putt and she was informed that she has brain mets.  Pt stated she went into shock and didn't hear anything else Dr. Mosetta Putt was telling her.  Pt stated she would like to speak to Dr. Mosetta Putt again because she wants to know how long she has to live and have a few more questions.  Pt would like for Dr. Mosetta Putt to give her a call back now since she's calmed down.  Notified Dr. Mosetta Putt of the pt's call.

## 2023-01-05 NOTE — Assessment & Plan Note (Signed)
-  This was discovered during her staging CT scan for newly diagnosed colon cancer.  It showed a 4.9 x 2.8 cm subpleural mass in the left upper lobe, and a new supraclavicular and mediastinal adenopathy -Biopsy of the left supraclavicular lymph node showed high-grade neuroendocrine tumor, favor small cell cancer, TTF-1 positive, consistent with lung primary. -PET scan and brain MRI are pending -If no distant metastasis on PET and brain MRI, plan to proceed with concurrent chemoradiation.  Will refer to rad/onc

## 2023-01-05 NOTE — Progress Notes (Signed)
[facility]   [phone] Fax:(336) [phone]   Clinic Follow up Note   Patient Care Team: [name], MD as PCP - General (Internal Medicine) Glendale Chard, DO as Consulting Physician (Neurology) Malachy Mood, MD as Consulting Physician (Hematology and Oncology) 01/05/2023  I connected with [name] on 01/05/23 at  4:00 PM EST by telephone and verified that I am speaking with the correct person using two identifiers.   I discussed the limitations, risks, security and privacy concerns of performing an evaluation and management service by telephone and the availability of in person appointments. I also discussed with the patient that there may be a patient responsible charge related to this service. The patient expressed understanding and agreed to proceed.   Patient's location:  Pet's office  Provider's location:  Office    CHIEF COMPLAINT: f/u biopsy result    CURRENT THERAPY: pending   Oncology history Small cell lung cancer (HCC) -This was discovered during her staging CT scan for newly diagnosed colon cancer.  It showed a 4.9 x 2.8 cm subpleural mass in the left upper lobe, and a new supraclavicular and mediastinal adenopathy -Biopsy of the left supraclavicular lymph node showed high-grade neuroendocrine tumor, favor small cell cancer, TTF-1 positive, consistent with lung primary. -PET scan and brain MRI are pending -If no distant metastasis on PET and brain MRI, plan to proceed with concurrent chemoradiation.  Will refer to rad/onc    Assessment and Plan    Small Cell Lung Cancer   Confirmed diagnosis based on lymph node biopsy. Aggressive cancer likely related to smoking history. No symptoms of cough or dyspnea. Metastasis to supraclavicular lymph node but confined to chest. Potential curable if brain MRI and PET scan are negative. Discussed 30% risk of brain metastasis despite absence of neurological symptoms. Poor prognosis if brain  metastasis is present, with survival ranging from a few to several months. Discussed chemotherapy and potential radiation. Patient concerned about prognosis and impact on her son.   - Order brain MRI with and without contrast - Proceed with scheduled PET scan on December 12 - Schedule follow-up visit to review PET and MRI results   - Discuss treatment options including chemotherapy and radiation if brain metastasis is confirmed   - Coordinate with patient's son for next visit    Colon Cancer   Existing condition; lung cancer diagnosis incidental from CT scan for colon cancer.    Follow-up   - Schedule follow-up within next week visit based on MRI dates and patient's son's availability   -rad/onc referral     Discussed the use of AI scribe software for clinical note transcription with the patient, who gave verbal consent to proceed.  History of Present Illness   A 72 year old female patient with a history of colon cancer and smoking was contacted via phone to discuss recent biopsy results. The patient was at a vet's office during the call and had to step out to have the conversation. She expressed anxiety about the results, stating that she had been awake all night and was expecting bad news. She had not yet informed her son about her health concerns, wanting to know more about her condition first.  The patient reported no recent symptoms such as cough or shortness of breath. She did mention having occasional headaches on her right side, but she also has a known issue with an occipital nerve. The patient was concerned about the implications of these headaches in light of  her new diagnosis.         REVIEW OF SYSTEMS:   Constitutional: Denies fevers, chills or abnormal weight loss Eyes: Denies blurriness of vision Ears, nose, mouth, throat, and face: Denies mucositis or sore throat Respiratory: Denies cough, dyspnea or wheezes Cardiovascular: Denies palpitation, chest discomfort or lower  extremity swelling Gastrointestinal:  Denies nausea, heartburn or change in bowel habits Skin: Denies abnormal skin rashes Lymphatics: Denies new lymphadenopathy or easy bruising Neurological:Denies numbness, tingling or new weaknesses Behavioral/Psych: Mood is stable, no new changes  All other systems were reviewed with the patient and are negative.  MEDICAL HISTORY:  Past Medical History:  Diagnosis Date   Allergy    Anxiety    Depression    Diabetes mellitus without complication (HCC)    Heart murmur    Hypertension    Thyroid disease    Ulcer     SURGICAL HISTORY: Past Surgical History:  Procedure Laterality Date   BREAST SURGERY     CESAREAN SECTION     CHOLECYSTECTOMY     COLONOSCOPY WITH PROPOFOL N/A 10/30/2022   Procedure: COLONOSCOPY WITH PROPOFOL;  Surgeon: Jeani Hawking, MD;  Location: [facility];  Service: Gastroenterology;  Laterality: N/A;   ENDOSCOPIC MUCOSAL RESECTION  10/30/2022   Procedure: ENDOSCOPIC MUCOSAL RESECTION;  Surgeon: Jeani Hawking, MD;  Location: [facility];  Service: Gastroenterology;;   HEMOSTASIS CLIP PLACEMENT  10/30/2022   Procedure: HEMOSTASIS CLIP PLACEMENT;  Surgeon: Jeani Hawking, MD;  Location: [facility];  Service: Gastroenterology;;   NASAL SEPTUM SURGERY     POLYPECTOMY  10/30/2022   Procedure: POLYPECTOMY;  Surgeon: Jeani Hawking, MD;  Location: [facility];  Service: Gastroenterology;;   Sunnie Nielsen LIFTING INJECTION  10/30/2022   Procedure: SUBMUCOSAL LIFTING INJECTION;  Surgeon: Jeani Hawking, MD;  Location: [facility];  Service: Gastroenterology;;    I have reviewed the social history and family history with the patient and they are unchanged from previous note.  ALLERGIES:  has No Known Allergies.  MEDICATIONS:  Current Outpatient Medications  Medication Sig Dispense Refill   ALPRAZolam (XANAX) 0.5 MG tablet Take 1 tablet (0.5 mg total) by mouth 2 (two) times daily as needed for anxiety. 30 tablet 1   atorvastatin  (LIPITOR) 20 MG tablet Take 1 tablet by mouth once daily 90 tablet 1   buPROPion (WELLBUTRIN XL) 300 MG 24 hr tablet Take 1 tablet by mouth once daily 90 tablet 1   busPIRone (BUSPAR) 7.5 MG tablet Take 1 tablet (7.5 mg total) by mouth 2 (two) times daily. 60 tablet 1   escitalopram (LEXAPRO) 10 MG tablet Take 1 tablet (10 mg total) by mouth daily. 90 tablet 3   gabapentin (NEURONTIN) 300 MG capsule Take 300 mg by mouth as needed (numbness).     levothyroxine (SYNTHROID) 125 MCG tablet TAKE 1 TABLET BY MOUTH ONCE DAILY BEFORE BREAKFAST 90 tablet 0   metFORMIN (GLUCOPHAGE) 1000 MG tablet TAKE 1 TABLET BY MOUTH TWICE DAILY WITH A MEAL 180 tablet 1   metoprolol succinate (TOPROL-XL) 25 MG 24 hr tablet Take 1 tablet by mouth once daily 90 tablet 3   nicotine (NICODERM CQ) 21 mg/24hr patch Place 1 patch (21 mg total) onto the skin daily. 28 patch 3   pantoprazole (PROTONIX) 40 MG tablet Take 1 tablet twice a day for 2 weeks and then daily after that 90 tablet 3   valsartan-hydrochlorothiazide (DIOVAN-HCT) 160-12.5 MG tablet Take 1 tablet by mouth once daily 90 tablet 3   No current facility-administered medications  for this visit.    PHYSICAL EXAMINATION: Not performed   LABORATORY DATA:  I have reviewed the data as listed    Latest Ref Rng & Units 12/31/2022   11:44 AM 12/02/2022    4:14 PM 10/13/2022    3:43 PM  CBC  WBC 4.0 - 10.5 K/uL 7.2  6.6  11.0   Hemoglobin 12.0 - 15.0 g/dL 16.1  09.6  04.5   Hematocrit 36.0 - 46.0 % 39.3  40.2  42.2   Platelets 150 - 400 K/uL 374  411  412         Latest Ref Rng & Units 12/02/2022    4:14 PM 10/13/2022    3:43 PM 09/11/2022   12:59 PM  CMP  Glucose 70 - 99 mg/dL 409  96  811   BUN 8 - 23 mg/dL 11  7  12    Creatinine 0.44 - 1.00 mg/dL 9.14  7.82  9.56   Sodium 135 - 145 mmol/L 134  134  134   Potassium 3.5 - 5.1 mmol/L 4.0  4.6  4.0   Chloride 98 - 111 mmol/L 100  101  98   CO2 22 - 32 mmol/L 26  21  19    Calcium 8.9 - 10.3 mg/dL 21.3  9.2   9.7   Total Protein 6.5 - 8.1 g/dL 7.5  7.1  7.3   Total Bilirubin <1.2 mg/dL 1.0  1.6  0.8   Alkaline Phos 38 - 126 U/L 83  73  90   AST 15 - 41 U/L 12  18  19    ALT 0 - 44 U/L 7  6  14        RADIOGRAPHIC STUDIES: I have personally reviewed the radiological images as listed and agreed with the findings in the report. No results found.     I discussed the assessment and treatment plan with the patient. The patient was provided an opportunity to ask questions and all were answered. The patient agreed with the plan and demonstrated an understanding of the instructions.   The patient was advised to call back or seek an in-person evaluation if the symptoms worsen or if the condition fails to improve as anticipated.  I provided 21 minutes of non face-to-face telephone visit time during this encounter, and > 50% was spent counseling as documented under my assessment & plan.     Malachy Mood, MD 01/05/23

## 2023-01-07 ENCOUNTER — Encounter: Payer: Self-pay | Admitting: Emergency Medicine

## 2023-01-07 ENCOUNTER — Encounter (HOSPITAL_COMMUNITY): Payer: Medicare HMO

## 2023-01-07 ENCOUNTER — Institutional Professional Consult (permissible substitution): Payer: Medicare HMO | Admitting: Emergency Medicine

## 2023-01-07 ENCOUNTER — Ambulatory Visit (HOSPITAL_COMMUNITY)
Admission: RE | Admit: 2023-01-07 | Discharge: 2023-01-07 | Disposition: A | Discharge status: Home / Self Care | Payer: Medicare HMO | Source: Ambulatory Visit | Attending: Hematology | Admitting: Hematology

## 2023-01-07 DIAGNOSIS — C3490 Malignant neoplasm of unspecified part of unspecified bronchus or lung: Secondary | ICD-10-CM | POA: Insufficient documentation

## 2023-01-07 MED ORDER — GADOBUTROL 1 MMOL/ML IV SOLN
7.5000 mL | Freq: Once | INTRAVENOUS | Status: AC | PRN
  Administered 2023-01-07: 7.5 mL via INTRAVENOUS

## 2023-01-08 ENCOUNTER — Inpatient Hospital Stay: Payer: Medicare HMO | Admitting: Hematology

## 2023-01-08 ENCOUNTER — Ambulatory Visit (HOSPITAL_COMMUNITY)
Admission: RE | Admit: 2023-01-08 | Discharge: 2023-01-08 | Disposition: A | Discharge status: Home / Self Care | Payer: Medicare HMO | Source: Ambulatory Visit | Attending: Hematology | Admitting: Hematology

## 2023-01-08 DIAGNOSIS — R918 Other nonspecific abnormal finding of lung field: Secondary | ICD-10-CM | POA: Diagnosis not present

## 2023-01-08 DIAGNOSIS — C180 Malignant neoplasm of cecum: Secondary | ICD-10-CM | POA: Diagnosis not present

## 2023-01-08 DIAGNOSIS — C3492 Malignant neoplasm of unspecified part of left bronchus or lung: Secondary | ICD-10-CM | POA: Diagnosis not present

## 2023-01-08 LAB — GLUCOSE, CAPILLARY: Glucose-Capillary: 94 mg/dL (ref 70–99)

## 2023-01-08 MED ORDER — FLUDEOXYGLUCOSE F - 18 (FDG) INJECTION
7.0000 | Freq: Once | INTRAVENOUS | Status: AC
  Administered 2023-01-08: 7 via INTRAVENOUS

## 2023-01-08 NOTE — Assessment & Plan Note (Signed)
-  This was discovered during her staging CT scan for newly diagnosed colon cancer.  It showed a 4.9 x 2.8 cm subpleural mass in the left upper lobe, and a new supraclavicular and mediastinal adenopathy -Biopsy of the left supraclavicular lymph node showed high-grade neuroendocrine tumor, favor small cell cancer, TTF-1 positive, consistent with lung primary. -PET scan and brain MRI are pending -If no distant metastasis on PET and brain MRI, plan to proceed with concurrent chemoradiation.  Will refer to rad/onc

## 2023-01-10 NOTE — Assessment & Plan Note (Signed)
-  This was discovered during her staging CT scan for newly diagnosed colon cancer.  It showed a 4.9 x 2.8 cm subpleural mass in the left upper lobe, and a new supraclavicular and mediastinal adenopathy -Biopsy of the left supraclavicular lymph node showed high-grade neuroendocrine tumor, favor small cell cancer, TTF-1 positive, consistent with lung primary. -PET scan showed hypermetabolic mediastinal and left supraclavicular nodal metastasis in addition to the large left upper lobe primary tumor invading the chest wall.  No other distant metastasis on the PET scan. -Unfortunately her brain MRI showed a 4 mm lesion in the right periatrial lesion, highly concerning for metastasis. -We discussed the chance of cure is minimal due to her significant local regional nodal metastasis and a small brain metastasis, however given her very limited brain metastasis, I recommend concurrent chemoradiation to her thorax, and brain radiation.  -I recommend chemotherapy with carboplatin, etoposide and atezolizumab every 3 weeks for 4 cycles, followed by maintenance atezolizumab. -She is scheduled to see radiation oncologist Dr. Kathrynn Running tomorrow.  Will plan to start treatment on January 18, 2023.

## 2023-01-11 ENCOUNTER — Other Ambulatory Visit: Payer: Self-pay | Admitting: Radiation Therapy

## 2023-01-11 ENCOUNTER — Encounter: Payer: Self-pay | Admitting: Hematology

## 2023-01-11 ENCOUNTER — Inpatient Hospital Stay (HOSPITAL_BASED_OUTPATIENT_CLINIC_OR_DEPARTMENT_OTHER): Payer: Medicare HMO | Admitting: Hematology

## 2023-01-11 VITALS — BP 160/80 | HR 91 | Temp 97.5°F | Resp 15 | Wt 170.3 lb

## 2023-01-11 DIAGNOSIS — C7931 Secondary malignant neoplasm of brain: Secondary | ICD-10-CM | POA: Insufficient documentation

## 2023-01-11 DIAGNOSIS — C3412 Malignant neoplasm of upper lobe, left bronchus or lung: Secondary | ICD-10-CM

## 2023-01-11 DIAGNOSIS — C3411 Malignant neoplasm of upper lobe, right bronchus or lung: Secondary | ICD-10-CM | POA: Insufficient documentation

## 2023-01-11 DIAGNOSIS — Z510 Encounter for antineoplastic radiation therapy: Secondary | ICD-10-CM | POA: Diagnosis not present

## 2023-01-11 DIAGNOSIS — Z79899 Other long term (current) drug therapy: Secondary | ICD-10-CM | POA: Insufficient documentation

## 2023-01-11 DIAGNOSIS — F17210 Nicotine dependence, cigarettes, uncomplicated: Secondary | ICD-10-CM | POA: Diagnosis not present

## 2023-01-11 DIAGNOSIS — Z5111 Encounter for antineoplastic chemotherapy: Secondary | ICD-10-CM | POA: Insufficient documentation

## 2023-01-11 NOTE — Progress Notes (Signed)
[facility]   Telephone:(336) [phone] Fax:(336) [phone]   Clinic Follow up Note   Patient Care Team: [name], MD as PCP - General (Internal Medicine) Glendale Chard, DO as Consulting Physician (Neurology) Malachy Mood, MD as Consulting Physician (Hematology and Oncology)  Date of Service:  01/11/2023  CHIEF COMPLAINT: f/u of newly diagnosed small cell lung cancer  CURRENT THERAPY:  Pending concurrent chemoradiation  Oncology History   Small cell lung cancer [facility]) -This was discovered during her staging CT scan for newly diagnosed colon cancer.  It showed a 4.9 x 2.8 cm subpleural mass in the left upper lobe, and a new supraclavicular and mediastinal adenopathy -Biopsy of the left supraclavicular lymph node showed high-grade neuroendocrine tumor, favor small cell cancer, TTF-1 positive, consistent with lung primary. -PET scan showed hypermetabolic mediastinal and left supraclavicular nodal metastasis in addition to the large left upper lobe primary tumor invading the chest wall.  No other distant metastasis on the PET scan. -Unfortunately her brain MRI showed a 4 mm lesion in the right periatrial lesion, highly concerning for metastasis. -We discussed the chance of cure is minimal due to her significant local regional nodal metastasis and a small brain metastasis, however given her very limited brain metastasis, I recommend concurrent chemoradiation to her thorax, and brain radiation.  -I recommend chemotherapy with carboplatin, etoposide and atezolizumab every 3 weeks for 4 cycles, followed by maintenance atezolizumab. -She is scheduled to see radiation oncologist Dr. Kathrynn Running tomorrow.  Will plan to start treatment on January 18, 2023.    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 PET scan shows no liver or other distant involvement. The patient is motivated to pursue treatment options, including chemotherapy, radiation, and immunotherapy. She initially wanted surgery but I explained to her that surgery would not be offered due to the extensive disease and high risk of recurrence.  -We discussed that her disease is probably not curable given the brain metastasis, however will waiting to offer definitive treatment due to very limited metastasis.  -per pt's request, I will transfer her care to my partner Dr. Arbutus Ped - Coordinate with Dr. Kathrynn Running for brain and thoracic radiation -pt wants to Follow up with Dr. Fara Boros regarding overall treatment plan and alternative therapies -will start her treatment as soon as possible   Cecal Colon Cancer -Discovered on colonoscopy, status post polypectomy which showed focal invasive adenocarcinoma.  Further treatment deferred until lung cancer treatment is underway. - Defer further treatment for colon cancer until lung cancer treatment is completed  Diverticulitis Upcoming appointment with Dr. Irving Shows to discuss condition and overall health. - Attend appointment with Dr. Irving Shows on Wednesday  General Health Maintenance Smoker with increased smoking due to stress. Interested in nicotine patches for smoking cessation. - Discuss nicotine patches for smoking cessation  Plan -I personally reviewed her PET scan and brain MRI images and discussing the findings -I recommended concurrent chemoradiation -I have requested [employer] 1 anti-PD-1 expression on her lymph node biopsy -Will transfer her care to my partner Dr. Arbutus Ped -She is scheduled to see radiation oncologist Dr. Kathrynn Running tomorrow     SUMMARY OF ONCOLOGIC HISTORY: Oncology History  Small cell lung cancer (HCC)  12/09/2022 Initial Diagnosis   Small cell lung cancer (HCC)   01/07/2023 Cancer Staging  Staging form: Lung, AJCC 8th Edition - Clinical stage from  01/07/2023: Stage IVA (cT3, cN3, cM1b) - Signed by Malachy Mood, MD on 01/10/2023      Discussed the use of AI scribe software for clinical note transcription with the patient, who gave verbal consent to proceed.  History of Present Illness   The patient, with a history of small cell lung cancer, presents with concerns about her diagnosis and treatment plan. She expresses a strong desire for surgical removal of the tumor, despite being informed that surgery will not cure her condition. The patient's insistence on surgery is influenced by her mother's experience with non-smoking lung cancer, which was treated successfully with surgery at another hospital. The patient is aware of the severity of her condition, expressing worry and sleeplessness. She is determined to fight the cancer and is open to starting chemotherapy and radiation treatment. The patient's son suggests an alternative treatment, Carbon 60, which he believes can cure cancer. The patient is open to discussing this with her other doctors.         All other systems were reviewed with the patient and are negative.  MEDICAL HISTORY:  Past Medical History:  Diagnosis Date   Allergy    Anxiety    Depression    Diabetes mellitus without complication (HCC)    Heart murmur    Hypertension    Thyroid disease    Ulcer     SURGICAL HISTORY: Past Surgical History:  Procedure Laterality Date   BREAST SURGERY     CESAREAN SECTION     CHOLECYSTECTOMY     COLONOSCOPY WITH PROPOFOL N/A 10/30/2022   Procedure: COLONOSCOPY WITH PROPOFOL;  Surgeon: Jeani Hawking, MD;  Location: [facility];  Service: Gastroenterology;  Laterality: N/A;   ENDOSCOPIC MUCOSAL RESECTION  10/30/2022   Procedure: ENDOSCOPIC MUCOSAL RESECTION;  Surgeon: Jeani Hawking, MD;  Location: [facility];  Service: Gastroenterology;;   HEMOSTASIS CLIP PLACEMENT  10/30/2022   Procedure: HEMOSTASIS CLIP PLACEMENT;  Surgeon: Jeani Hawking, MD;  Location: [facility];   Service: Gastroenterology;;   NASAL SEPTUM SURGERY     POLYPECTOMY  10/30/2022   Procedure: POLYPECTOMY;  Surgeon: Jeani Hawking, MD;  Location: [facility];  Service: Gastroenterology;;   Sunnie Nielsen LIFTING INJECTION  10/30/2022   Procedure: SUBMUCOSAL LIFTING INJECTION;  Surgeon: Jeani Hawking, MD;  Location: [facility];  Service: Gastroenterology;;    I have reviewed the social history and family history with the patient and they are unchanged from previous note.  ALLERGIES:  has no known allergies.  MEDICATIONS:  Current Outpatient Medications  Medication Sig Dispense Refill   ALPRAZolam (XANAX) 0.5 MG tablet Take 1 tablet (0.5 mg total) by mouth 2 (two) times daily as needed for anxiety. 30 tablet 1   atorvastatin (LIPITOR) 20 MG tablet Take 1 tablet by mouth once daily 90 tablet 1   buPROPion (WELLBUTRIN XL) 300 MG 24 hr tablet Take 1 tablet by mouth once daily 90 tablet 1   busPIRone (BUSPAR) 7.5 MG tablet Take 1 tablet (7.5 mg total) by mouth 2 (two) times daily. 60 tablet 1   escitalopram (LEXAPRO) 10 MG tablet Take 1 tablet (10 mg total) by mouth daily. 90 tablet 3   gabapentin (NEURONTIN) 300 MG capsule Take 300 mg by mouth as needed (numbness).     levothyroxine (SYNTHROID) 125 MCG tablet TAKE 1 TABLET BY MOUTH ONCE DAILY BEFORE BREAKFAST 90 tablet 0   metFORMIN (GLUCOPHAGE) 1000 MG tablet TAKE 1 TABLET BY MOUTH TWICE DAILY WITH  A MEAL 180 tablet 1   metoprolol succinate (TOPROL-XL) 25 MG 24 hr tablet Take 1 tablet by mouth once daily 90 tablet 3   nicotine (NICODERM CQ) 21 mg/24hr patch Place 1 patch (21 mg total) onto the skin daily. 28 patch 3   pantoprazole (PROTONIX) 40 MG tablet Take 1 tablet twice a day for 2 weeks and then daily after that 90 tablet 3   valsartan-hydrochlorothiazide (DIOVAN-HCT) 160-12.5 MG tablet Take 1 tablet by mouth once daily 90 tablet 3   No current facility-administered medications for this visit.    PHYSICAL EXAMINATION: ECOG PERFORMANCE  STATUS: 1 - Symptomatic but completely ambulatory  Vitals:   01/11/23 1231 01/11/23 1244  BP: (!) 169/73 (!) 160/80  Pulse:    Resp:    Temp:    SpO2:     Wt Readings from Last 3 Encounters:  01/11/23 170 lb 4.8 oz (77.2 kg)  12/31/22 171 lb (77.6 kg)  12/09/22 171 lb 3.2 oz (77.7 kg)     GENERAL:alert, no distress and comfortable SKIN: skin color, texture, turgor are normal, no rashes or significant lesions EYES: normal, Conjunctiva are pink and non-injected, sclera clear NECK: supple, thyroid normal size, non-tender, without nodularity LYMPH:  no palpable lymphadenopathy in the cervical, axillary  LUNGS: clear to auscultation and percussion with normal breathing effort HEART: regular rate & rhythm and no murmurs and no lower extremity edema ABDOMEN:abdomen soft, non-tender and normal bowel sounds Musculoskeletal:no cyanosis of digits and no clubbing  NEURO: alert & oriented x 3 with fluent speech, no focal motor/sensory deficits  Physical Exam          LABORATORY DATA:  I have reviewed the data as listed    Latest Ref Rng & Units 12/31/2022   11:44 AM 12/02/2022    4:14 PM 10/13/2022    3:43 PM  CBC  WBC 4.0 - 10.5 K/uL 7.2  6.6  11.0   Hemoglobin 12.0 - 15.0 g/dL 40.9  81.1  91.4   Hematocrit 36.0 - 46.0 % 39.3  40.2  42.2   Platelets 150 - 400 K/uL 374  411  412         Latest Ref Rng & Units 12/02/2022    4:14 PM 10/13/2022    3:43 PM 09/11/2022   12:59 PM  CMP  Glucose 70 - 99 mg/dL 782  96  956   BUN 8 - 23 mg/dL 11  7  12    Creatinine 0.44 - 1.00 mg/dL 2.13  0.86  5.78   Sodium 135 - 145 mmol/L 134  134  134   Potassium 3.5 - 5.1 mmol/L 4.0  4.6  4.0   Chloride 98 - 111 mmol/L 100  101  98   CO2 22 - 32 mmol/L 26  21  19    Calcium 8.9 - 10.3 mg/dL 46.9  9.2  9.7   Total Protein 6.5 - 8.1 g/dL 7.5  7.1  7.3   Total Bilirubin <1.2 mg/dL 1.0  1.6  0.8   Alkaline Phos 38 - 126 U/L 83  73  90   AST 15 - 41 U/L 12  18  19    ALT 0 - 44 U/L 7  6  14         RADIOGRAPHIC STUDIES: I have personally reviewed the radiological images as listed and agreed with the findings in the report. No results found.    No orders of the defined types were placed in this encounter.  All questions  were answered. The patient knows to call the clinic with any problems, questions or concerns. No barriers to learning was detected. The total time spent in the appointment was 40 minutes.     Malachy Mood, MD 01/11/2023

## 2023-01-11 NOTE — Telephone Encounter (Signed)
We will see her next Wednesday.  Thanks.

## 2023-01-11 NOTE — Progress Notes (Signed)
Location/Histology of Brain Tumor: Right Periatrial Lesion  Patient presented with symptoms of:  Referral by Dr. Malachy Mood  01/07/2023 Dr. Malachy Mood MR Brain with/without Contrast CLINICAL DATA: Small cell lung cancer (SCLC), staging.  IMPRESSION: Approximally 4 mm enhancing right periatrial lesion, concerning for a metastasis given the clinical history.   01/08/2023 Dr. Malachy Mood NM PET Image Initial (PI) Skull Base to Thigh CLINICAL DATA:  Subsequent treatment strategy for LEFT upper lobe subpleural mass concerning for bronchogenic carcinoma. Additional history of recently diagnosed colon cancer.  IMPRESSION: 1. Intensely hypermetabolic LEFT upper lobe pleural mass consistent with bronchogenic carcinoma. 2. Large hypermetabolic LEFT supraclavicular and small LEFT mediastinal lymph node consistent with nodal metastasis. 3. Hypermetabolic LEFT upper lobe perihilar nodule is concerning for pulmonary metastasis. 4. No evidence of colon cancer metastasis.   Past or anticipated interventions, if any, per neurosurgery: NA  Past or anticipated interventions, if any, per medical oncology:  01/05/2023 Dr. Malachy Mood    Dose of Decadron, if applicable: No  Recent neurologic symptoms, if any:  Seizures: No Headaches: No Nausea: No Dizziness/ataxia: No Difficulty with hand coordination: No Focal numbness/weakness: bilateral feet numbness/weakness Visual deficits/changes: No, uses readers. Confusion/Memory deficits:  No  Painful bone metastases at present, if any: 0/10  SAFETY ISSUES: Prior radiation? No Pacemaker/ICD? No Possible current pregnancy? Postmenopausal Is the patient on methotrexate? No  Additional Complaints / other details:

## 2023-01-12 ENCOUNTER — Emergency Department (HOSPITAL_COMMUNITY)
Admission: EM | Admit: 2023-01-12 | Discharge: 2023-01-12 | Disposition: A | Discharge status: Home / Self Care | Payer: Medicare HMO | Attending: Emergency Medicine | Admitting: Emergency Medicine

## 2023-01-12 ENCOUNTER — Other Ambulatory Visit: Payer: Self-pay

## 2023-01-12 ENCOUNTER — Encounter: Payer: Self-pay | Admitting: Radiation Oncology

## 2023-01-12 ENCOUNTER — Ambulatory Visit
Admission: RE | Admit: 2023-01-12 | Discharge: 2023-01-12 | Disposition: A | Discharge status: Home / Self Care | Payer: Medicare HMO | Source: Ambulatory Visit | Attending: Radiation Oncology | Admitting: Radiation Oncology

## 2023-01-12 ENCOUNTER — Telehealth: Payer: Self-pay | Admitting: Radiation Therapy

## 2023-01-12 ENCOUNTER — Emergency Department (HOSPITAL_COMMUNITY): Payer: Medicare HMO

## 2023-01-12 ENCOUNTER — Telehealth: Payer: Self-pay | Admitting: Medical Oncology

## 2023-01-12 DIAGNOSIS — C7931 Secondary malignant neoplasm of brain: Secondary | ICD-10-CM

## 2023-01-12 DIAGNOSIS — W19XXXA Unspecified fall, initial encounter: Secondary | ICD-10-CM

## 2023-01-12 DIAGNOSIS — S22088A Other fracture of T11-T12 vertebra, initial encounter for closed fracture: Secondary | ICD-10-CM | POA: Diagnosis not present

## 2023-01-12 DIAGNOSIS — C3412 Malignant neoplasm of upper lobe, left bronchus or lung: Secondary | ICD-10-CM

## 2023-01-12 DIAGNOSIS — W1830XA Fall on same level, unspecified, initial encounter: Secondary | ICD-10-CM | POA: Insufficient documentation

## 2023-01-12 DIAGNOSIS — S22080A Wedge compression fracture of T11-T12 vertebra, initial encounter for closed fracture: Secondary | ICD-10-CM | POA: Diagnosis not present

## 2023-01-12 DIAGNOSIS — Y92009 Unspecified place in unspecified non-institutional (private) residence as the place of occurrence of the external cause: Secondary | ICD-10-CM | POA: Diagnosis not present

## 2023-01-12 DIAGNOSIS — I10 Essential (primary) hypertension: Secondary | ICD-10-CM | POA: Diagnosis not present

## 2023-01-12 DIAGNOSIS — E119 Type 2 diabetes mellitus without complications: Secondary | ICD-10-CM | POA: Diagnosis not present

## 2023-01-12 DIAGNOSIS — M545 Low back pain, unspecified: Secondary | ICD-10-CM | POA: Diagnosis not present

## 2023-01-12 DIAGNOSIS — Z79899 Other long term (current) drug therapy: Secondary | ICD-10-CM | POA: Diagnosis not present

## 2023-01-12 DIAGNOSIS — Z7984 Long term (current) use of oral hypoglycemic drugs: Secondary | ICD-10-CM | POA: Insufficient documentation

## 2023-01-12 DIAGNOSIS — I700 Atherosclerosis of aorta: Secondary | ICD-10-CM | POA: Diagnosis not present

## 2023-01-12 DIAGNOSIS — M549 Dorsalgia, unspecified: Secondary | ICD-10-CM | POA: Diagnosis not present

## 2023-01-12 DIAGNOSIS — R918 Other nonspecific abnormal finding of lung field: Secondary | ICD-10-CM | POA: Diagnosis not present

## 2023-01-12 DIAGNOSIS — R29890 Loss of height: Secondary | ICD-10-CM | POA: Diagnosis not present

## 2023-01-12 DIAGNOSIS — R110 Nausea: Secondary | ICD-10-CM | POA: Diagnosis not present

## 2023-01-12 MED ORDER — HYDROMORPHONE HCL 1 MG/ML IJ SOLN
0.5000 mg | Freq: Once | INTRAMUSCULAR | Status: AC
  Administered 2023-01-12: 0.5 mg via INTRAVENOUS
  Filled 2023-01-12: qty 1

## 2023-01-12 MED ORDER — LIDOCAINE 5 % EX PTCH: 1.0000 | MEDICATED_PATCH | CUTANEOUS | 0 refills | Status: DC

## 2023-01-12 MED ORDER — OXYCODONE HCL 5 MG PO TABS: 5.0000 mg | ORAL_TABLET | ORAL | 0 refills | Status: AC | PRN

## 2023-01-12 MED ORDER — ACETAMINOPHEN 500 MG PO TABS
1000.0000 mg | ORAL_TABLET | Freq: Once | ORAL | Status: AC
  Administered 2023-01-12: 1000 mg via ORAL
  Filled 2023-01-12: qty 2

## 2023-01-12 MED ORDER — LIDOCAINE 5 % EX PTCH
1.0000 | MEDICATED_PATCH | CUTANEOUS | Status: DC
  Administered 2023-01-12: 1 via TRANSDERMAL
  Filled 2023-01-12: qty 1

## 2023-01-12 MED ORDER — ONDANSETRON HCL 4 MG/2ML IJ SOLN
4.0000 mg | Freq: Once | INTRAMUSCULAR | Status: AC
  Administered 2023-01-12: 4 mg via INTRAVENOUS
  Filled 2023-01-12: qty 2

## 2023-01-12 NOTE — Progress Notes (Signed)
Orthopedic Tech Progress Note Patient Details:  Jasmine Long 1950/10/01 [id number]  Patient ID: [name], female   DOB: 29-Jun-1950, 72 y.o.   MRN: [id number] TLSO ordered from [facility]anger. Darleen Crocker 01/12/2023, 10:22 PM

## 2023-01-12 NOTE — Telephone Encounter (Signed)
I explained her her appts.

## 2023-01-12 NOTE — Telephone Encounter (Signed)
I spoke with pt about her upcoming [facility] treatment planning appointments. She is overwhelmed with the frequent messages from MyChart about visits and procedures being scheduled for her. After a detailed discussion, she understands what we have set up and feels better about what is going on.   She has specific questions about chemo and what to expect. I told her that Dr. Arbutus Ped will be able to explain this to her tomorrow during their visit.   [name] R.T.(R)(T) Radiation Special Procedures Navigator

## 2023-01-12 NOTE — ED Provider Notes (Signed)
[place] EMERGENCY DEPARTMENT AT [facility] Provider Note   CSN: [id number] Arrival date & time: 01/12/23  1948     History  Chief Complaint  Patient presents with   Jasmine Long is a 72 y.o. female with PMH as listed below who presents with back pain after falling at home d/t tripping over her bulldog puppy. Fell flat onto her buttocks upright on the hardwood floor and felt a crushing type pain in her mid back. No h/o similar. Rates pain 10/10 on arrival. No head trauma or LOC. No pain elsewhere. No N/T, weakness in LEs. Does endorse mild nausea d/t pain.  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  Past Medical History:  Diagnosis Date   Allergy    Anxiety    Depression    Diabetes mellitus without complication (HCC)    Heart murmur    Hypertension    Thyroid disease    Ulcer        Home Medications Prior to Admission medications   Medication Sig Start Date End Date Taking? Authorizing Provider  lidocaine (LIDODERM) 5 % Place 1 patch onto the skin daily. Remove & Discard patch within 12 hours or as directed by MD 01/12/23  Yes Loetta Rough, MD  oxyCODONE (ROXICODONE) 5 MG immediate release tablet Take 1 tablet (5 mg total) by mouth every 4 (four) hours as needed for up to 3 days for severe pain (pain score 7-10). 01/12/23 01/15/23 Yes Loetta Rough, MD  ALPRAZolam Prudy Feeler) 0.5 MG tablet Take 1 tablet (0.5 mg total) by mouth 2 (two) times daily as needed for anxiety. 11/07/22   [name], MD  atorvastatin (LIPITOR) 20 MG tablet Take 1 tablet by mouth once daily 08/18/22   [name], MD  buPROPion (WELLBUTRIN XL) 300 MG 24 hr tablet Take 1 tablet by mouth once daily 08/18/22   [name], MD  busPIRone (BUSPAR) 7.5 MG tablet Take 1 tablet (7.5 mg total) by mouth 2 (two) times daily. 07/16/22    [name], MD  escitalopram (LEXAPRO) 10 MG tablet Take 1 tablet (10 mg total) by mouth daily. 12/25/21 12/20/22  [name], MD  gabapentin (NEURONTIN) 300 MG capsule Take 300 mg by mouth as needed (numbness). 08/14/22   [provider]  levothyroxine (SYNTHROID) 125 MCG tablet TAKE 1 TABLET BY MOUTH ONCE DAILY BEFORE BREAKFAST 04/14/22   [name], MD  metFORMIN (GLUCOPHAGE) 1000 MG tablet TAKE 1 TABLET BY MOUTH TWICE DAILY WITH A MEAL 08/18/22   [name], MD  metoprolol succinate (TOPROL-XL) 25 MG 24 hr tablet Take 1 tablet by mouth once daily 05/27/22   Sagardia, Eilleen Kempf, MD  nicotine (NICODERM CQ) 21 mg/24hr patch Place 1 patch (21 mg total) onto the skin daily. 11/26/22   [name], MD  pantoprazole (PROTONIX) 40 MG tablet Take 1 tablet twice a day for 2 weeks and then daily after that 08/08/22   [name], MD  valsartan-hydrochlorothiazide (DIOVAN-HCT) 160-12.5 MG tablet Take 1 tablet by mouth once daily 05/27/22   [name], MD      Allergies    Patient has no known allergies.    Review of Systems   Review of Systems A 10 point review of systems was performed and is negative unless otherwise reported in HPI.  Physical Exam Updated Vital Signs BP 136/62   Pulse 72   Temp 98 F (36.7 C)   Resp 19   SpO2 99%  Physical Exam General: Normal appearing female, lying in bed.  HEENT: Sclera anicteric, MMM, trachea midline. NCAT. No midline C spine TTP.  Cardiology: RRR, no murmurs/rubs/gallops.  No chest wall tenderness palpation Resp: Normal respiratory rate and effort. CTAB, no wheezes, rhonchi, crackles.  Abd: Soft, non-tender, non-distended. No rebound tenderness or guarding.  Pelvic: Pelvis stable nontender MSK: No peripheral edema or signs of trauma. Extremities without deformity or TTP. No cyanosis or clubbing. Skin: warm, dry.  Back: Tenderness to palpation in the midline of lower  thoracic spine.  Also paravertebral muscular tenderness outpatient.  No overlying signs of trauma Neuro: A&Ox4, CNs II-XII grossly intact. MAEs. Sensation grossly intact.  Psych: Normal mood and affect.   ED Results / Procedures / Treatments   Labs (all labs ordered are listed, but only abnormal results are displayed) Labs Reviewed - No data to display  EKG None  Radiology CT Lumbar Spine Wo Contrast Result Date: 01/12/2023 CLINICAL DATA:  Fall EXAM: CT THORACIC AND LUMBAR SPINE WITHOUT CONTRAST TECHNIQUE: Multidetector CT imaging of the thoracic and lumbar spine was performed without contrast. Multiplanar CT image reconstructions were also generated. RADIATION DOSE REDUCTION: This exam was performed according to the departmental dose-optimization program which includes automated exposure control, adjustment of the mA and/or kV according to patient size and/or use of iterative reconstruction technique. COMPARISON:  Chest CT 11/27/2022 FINDINGS: CT THORACIC SPINE FINDINGS Alignment: Normal Vertebrae: T12 wedge compression fracture with approximately 25% anterior height loss. No retropulsion. No other thoracic spine fracture. Bulky anterior osteophytes at the cervicothoracic junction. Paraspinal and other soft tissues: Left upper lobe subpleural mass incompletely visualized. Disc levels: No spinal canal stenosis. CT LUMBAR SPINE FINDINGS Segmentation: 5 lumbar type vertebrae. Alignment: Normal. Vertebrae: No acute fracture or focal pathologic process. Paraspinal and other soft tissues: Calcific aortic atherosclerosis. Sigmoid diverticulosis. Disc levels: No spinal canal stenosis. IMPRESSION: 1. T12 wedge compression fracture with approximately 25% anterior height loss. No retropulsion. 2. No acute fracture or static subluxation of the lumbar spine. 3. Left upper lobe subpleural mass incompletely visualized. Aortic Atherosclerosis (ICD10-I70.0). Electronically Signed   By: Deatra Robinson M.D.   On:  01/12/2023 21:43   CT Thoracic Spine Wo Contrast Result Date: 01/12/2023 CLINICAL DATA:  Fall EXAM: CT THORACIC AND LUMBAR SPINE WITHOUT CONTRAST TECHNIQUE: Multidetector CT imaging of the thoracic and lumbar spine was performed without contrast. Multiplanar CT image reconstructions were also generated. RADIATION DOSE REDUCTION: This exam was performed according to the departmental dose-optimization program which includes automated exposure control, adjustment of the mA and/or kV according to patient size and/or use of iterative reconstruction technique. COMPARISON:  Chest CT 11/27/2022 FINDINGS: CT THORACIC SPINE FINDINGS Alignment: Normal Vertebrae: T12 wedge compression fracture with approximately 25% anterior height loss. No retropulsion. No other thoracic spine fracture. Bulky anterior osteophytes at the cervicothoracic junction. Paraspinal and other soft tissues: Left upper lobe subpleural mass incompletely visualized. Disc levels: No spinal canal stenosis. CT LUMBAR SPINE FINDINGS Segmentation: 5 lumbar type vertebrae. Alignment: Normal. Vertebrae: No acute fracture or focal pathologic process. Paraspinal and other soft tissues: Calcific aortic atherosclerosis. Sigmoid diverticulosis. Disc levels: No spinal canal stenosis. IMPRESSION: 1. T12 wedge compression fracture with approximately 25% anterior height loss. No retropulsion. 2. No acute fracture or static subluxation of the lumbar spine. 3. Left upper lobe subpleural mass incompletely visualized. Aortic Atherosclerosis (ICD10-I70.0).  Electronically Signed   By: Deatra Robinson M.D.   On: 01/12/2023 21:43    Procedures Procedures    Medications Ordered in ED Medications  lidocaine (LIDODERM) 5 % 1 patch (1 patch Transdermal Patch Applied 01/12/23 2240)  HYDROmorphone (DILAUDID) injection 0.5 mg (0.5 mg Intravenous Given 01/12/23 2024)  ondansetron (ZOFRAN) injection 4 mg (4 mg Intravenous Given 01/12/23 2024)  acetaminophen (TYLENOL) tablet  1,000 mg (1,000 mg Oral Given 01/12/23 2240)    ED Course/ Medical Decision Making/ A&P                          Medical Decision Making Amount and/or Complexity of Data Reviewed Radiology: ordered. Decision-making details documented in ED Course.  Risk OTC drugs. Prescription drug management.    This patient presents to the ED for concern of back pain after fall, this involves an extensive number of treatment options, and is a complaint that carries with it a high risk of complications and morbidity.  I considered the following differential and admission for this acute, potentially life threatening condition.   MDM:    DDX for trauma includes but is not limited to:  -No head trauma or neck pain. No chest or abdominal injury. -Patient reports she was in her normal state of health until the fall, fall was mechanical after tripping over a puppy. -Spinal Cord or Vertebral injury - consider vertebral fracture given midline thoracic back pain and trauma. No neurologic deficits to indicate spinal cord injury.    Clinical Course as of 01/12/23 2354  Tue Jan 12, 2023  2152 CT Thoracic Spine Wo Contrast 1. T12 wedge compression fracture with approximately 25% anterior height loss. No retropulsion. 2. No acute fracture or static subluxation of the lumbar spine. 3. Left upper lobe subpleural mass incompletely visualized.   [HN]  2223 D/w neurosurgery who states can do TLSO. TLSO ordered.  [HN]    Clinical Course User Index [HN] Loetta Rough, MD    Imaging Studies ordered: I ordered imaging studies including CT thoracic/lumbar spine I independently visualized and interpreted imaging. I agree with the radiologist interpretation  Additional history obtained from chart review, son at bedside.  Reevaluation: After the interventions noted above, I reevaluated the patient and found that they have :improved  Social Determinants of Health: Lives independently  Disposition:   patient is instructed to f/u with [facility] within 1-2 weeks and to maintain TLSO brace in the meantime. She is prescribed lidocaine patches and oxycodone for breakthrough pain. Cautioned about use of oxys especially while taking other sedating medications including gabapentin. She reports understanding. Instructed to take tylenol as well. DC w/ discharge instructions/return precautions. All questions answered to patient's satisfaction.    Co morbidities that complicate the patient evaluation  Past Medical History:  Diagnosis Date   Allergy    Anxiety    Depression    Diabetes mellitus without complication (HCC)    Heart murmur    Hypertension    Thyroid disease    Ulcer      Medicines Meds ordered this encounter  Medications   HYDROmorphone (DILAUDID) injection 0.5 mg   ondansetron (ZOFRAN) injection 4 mg   acetaminophen (TYLENOL) tablet 1,000 mg   lidocaine (LIDODERM) 5 % 1 patch   lidocaine (LIDODERM) 5 %    Sig: Place 1 patch onto the skin daily. Remove & Discard patch within 12 hours or as directed by MD    Dispense:  30 patch    Refill:  0   oxyCODONE (ROXICODONE) 5 MG immediate release tablet    Sig: Take 1 tablet (5 mg total) by mouth every 4 (four) hours as needed for up to 3 days for severe pain (pain score 7-10).    Dispense:  15 tablet    Refill:  0    I have reviewed the patients home medicines and have made adjustments as needed  Problem List / ED Course: Problem List Items Addressed This Visit   None Visit Diagnoses       Fall in home, initial encounter    -  Primary     Compression fracture of T12 vertebra, initial encounter [facility])                       This note was created using dictation software, which may contain spelling or grammatical errors.    Loetta Rough, MD 01/12/23 [phone]

## 2023-01-12 NOTE — Progress Notes (Signed)
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    ICD-10-CM   1. Metastasis to brain [facility])  C79.31     2. 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 female seen at the request of Dr. Mosetta Putt. She was recently diagnosed with colon cancer with focal invasive well to moderately differentiated adenocarcinoma, in 10/2022.  She was incidentally found to have a new, 4.9 cm subpleural mass in the anterior left upper lobe with associated supraclavicular and mediastinal adenopathy and a smaller, 8 mm nodule in the RUL, on staging CT chest performed 11/27/2022.  She proceeded to ultrasound-guided biopsy of the left supraclavicular lymph node on 12/31/22, and final surgical pathology revealed metastatic high-grade neuroendocrine carcinoma, consistent with small cell carcinoma of the lung.   A brain MRI was performed on 01/07/23 for disease staging and revealed a small, solitary, 4 mm enhancing right periatrial lesion.  To complete her disease staging, a PET scan was performed on 01/08/23 showing hypermetabolism to the LUL mass, left supraclavicular and mediastinal lymph nodes, and a LUL perihilar nodule. There was no evidence of colon cancer metastasis.  She has reviewed the pathology and imaging results with her medical oncologist, Dr. Mosetta Putt, and has been kindly referred to [place] today to discuss potential radiotherapy options.  PREVIOUS RADIATION THERAPY: No  PAST MEDICAL HISTORY:  Past Medical History:   Diagnosis Date   Allergy    Anxiety    Depression    Diabetes mellitus without complication (HCC)    Heart murmur    Hypertension    Thyroid disease    Ulcer       PAST SURGICAL HISTORY: Past Surgical History:  Procedure Laterality Date   BREAST SURGERY     CESAREAN SECTION     CHOLECYSTECTOMY     COLONOSCOPY WITH PROPOFOL N/A 10/30/2022   Procedure: COLONOSCOPY WITH PROPOFOL;  Surgeon: Jeani Hawking, MD;  Location: [facility];  Service: Gastroenterology;  Laterality: N/A;   ENDOSCOPIC MUCOSAL RESECTION  10/30/2022   Procedure: ENDOSCOPIC MUCOSAL RESECTION;  Surgeon: Jeani Hawking, MD;  Location: [facility];  Service: Gastroenterology;;   HEMOSTASIS CLIP PLACEMENT  10/30/2022   Procedure: HEMOSTASIS CLIP PLACEMENT;  Surgeon: Jeani Hawking, MD;  Location: [facility];  Service: Gastroenterology;;   NASAL SEPTUM SURGERY     POLYPECTOMY  10/30/2022   Procedure: POLYPECTOMY;  Surgeon: Jeani Hawking, MD;  Location: [facility];  Service: Gastroenterology;;   Sunnie Nielsen LIFTING INJECTION  10/30/2022   Procedure: SUBMUCOSAL LIFTING INJECTION;  Surgeon: Jeani Hawking, MD;  Location: [facility];  Service: Gastroenterology;;    FAMILY HISTORY:  Family History  Problem Relation Age of Onset   Lung cancer Mother 53   Dementia Father    Atrial fibrillation Father    Cancer Paternal Aunt        breast cancer   Colon cancer Paternal Aunt    Colon cancer Cousin     SOCIAL HISTORY: Her youngest son was murdered a few years  ago and she has 1 living son that is very involved in her care. Social History   Socioeconomic History   Marital status: Divorced    Spouse name: Not on file   Number of children: 2   Years of education: Not on file   Highest education level: Bachelor's degree (e.g., BA, AB, BS)  Occupational History   Occupation: Unemployed  Tobacco Use   Smoking status: Every Day    Current packs/day: 0.00    Average packs/day: 1.5 packs/day for 40.0 years (60.0 ttl  pk-yrs)    Types: Cigarettes    Start date: 01/20/1972    Last attempt to quit: 01/20/2012    Years since quitting: 10.9   Smokeless tobacco: Never   Tobacco comments:    Pt doing E-cigarette as of 01/20/12  Vaping Use   Vaping status: Former  Substance and Sexual Activity   Alcohol use: No   Drug use: No   Sexual activity: Never  Other Topics Concern   Not on file  Social History Narrative   Patient on worker's compensation for an injury sustained on the job 5 years ago.      Right handed and Left Handed       Lives in a two story home       One son deceased.    Social Drivers of Corporate investment banker Strain: Low Risk  (01/09/2023)   Overall Financial Resource Strain (CARDIA)    Difficulty of Paying Living Expenses: Not hard at all  Food Insecurity: No Food Insecurity (01/12/2023)   Hunger Vital Sign    Worried About Running Out of Food in the Last Year: Never true    Ran Out of Food in the Last Year: Never true  Transportation Needs: No Transportation Needs (01/12/2023)   PRAPARE - Administrator, Civil Service (Medical): No    Lack of Transportation (Non-Medical): No  Physical Activity: Inactive (01/09/2023)   Exercise Vital Sign    Days of Exercise per Week: 0 days    Minutes of Exercise per Session: 0 min  Stress: Stress Concern Present (01/09/2023)   [employer] of Occupational Health - Occupational Stress Questionnaire    Feeling of Stress : Very much  Social Connections: Socially Isolated (01/09/2023)   Social Connection and Isolation Panel [NHANES]    Frequency of Communication with Friends and Family: Twice a week    Frequency of Social Gatherings with Friends and Family: Never    Attends Religious Services: Never    Database administrator or Organizations: No    Attends Banker Meetings: Never    Marital Status: Divorced  Catering manager Violence: Not At Risk (01/12/2023)   Humiliation, Afraid, Rape, and Kick  questionnaire    Fear of Current or Ex-Partner: No    Emotionally Abused: No    Physically Abused: No    Sexually Abused: No    ALLERGIES: Patient has no known allergies.  MEDICATIONS:  Current Outpatient Medications  Medication Sig Dispense Refill   ALPRAZolam (XANAX) 0.5 MG tablet Take 1 tablet (0.5 mg total) by mouth 2 (two) times daily as needed for anxiety. 30 tablet 1   atorvastatin (LIPITOR) 20 MG tablet Take 1 tablet by mouth once daily 90 tablet 1   buPROPion (WELLBUTRIN XL) 300 MG 24 hr tablet Take 1 tablet by mouth once daily 90 tablet 1   busPIRone (BUSPAR) 7.5 MG tablet Take 1 tablet (7.5 mg total) by mouth 2 (two)  times daily. 60 tablet 1   escitalopram (LEXAPRO) 10 MG tablet Take 1 tablet (10 mg total) by mouth daily. 90 tablet 3   gabapentin (NEURONTIN) 300 MG capsule Take 300 mg by mouth as needed (numbness).     levothyroxine (SYNTHROID) 125 MCG tablet TAKE 1 TABLET BY MOUTH ONCE DAILY BEFORE BREAKFAST 90 tablet 0   metFORMIN (GLUCOPHAGE) 1000 MG tablet TAKE 1 TABLET BY MOUTH TWICE DAILY WITH A MEAL 180 tablet 1   metoprolol succinate (TOPROL-XL) 25 MG 24 hr tablet Take 1 tablet by mouth once daily 90 tablet 3   nicotine (NICODERM CQ) 21 mg/24hr patch Place 1 patch (21 mg total) onto the skin daily. 28 patch 3   pantoprazole (PROTONIX) 40 MG tablet Take 1 tablet twice a day for 2 weeks and then daily after that 90 tablet 3   valsartan-hydrochlorothiazide (DIOVAN-HCT) 160-12.5 MG tablet Take 1 tablet by mouth once daily 90 tablet 3   No current facility-administered medications for this encounter.    REVIEW OF SYSTEMS:  On review of systems, the patient reports that she is doing well overall. She denies any chest pain, shortness of breath, cough, fevers, chills, or night sweats.  She reports that she has lost a total of 100 pounds since April 2024.  She has a longstanding history of intermittent occipital headaches since the time of a fall years ago that resulted in  occipital nerve damage.  She denies any recent changes in her visual or auditory acuity, dizziness, focal weakness, difficulty with speech or paresthesias.  She denies any bowel or bladder disturbances, and denies abdominal pain, nausea or vomiting at present. She denies any new musculoskeletal or joint aches or pains.  A complete review of systems is obtained and is otherwise negative.    PHYSICAL EXAM:  Wt Readings from Last 3 Encounters:  01/11/23 170 lb 4.8 oz (77.2 kg)  12/31/22 171 lb (77.6 kg)  12/09/22 171 lb 3.2 oz (77.7 kg)   Temp Readings from Last 3 Encounters:  01/11/23 (!) 97.5 F (36.4 C) (Temporal)  12/31/22 98.6 F (37 C) (Oral)  12/09/22 97.9 F (36.6 C) (Oral)   BP Readings from Last 3 Encounters:  01/11/23 (!) 160/80  12/31/22 (!) 118/42  12/09/22 (!) 144/68   Pulse Readings from Last 3 Encounters:  01/11/23 91  12/31/22 90  12/09/22 87   Pain Assessment Pain Score: 0-No pain/10  Physical exam not performed in light of telephone consult visit format.   KPS = 80  100 - Normal; no complaints; no evidence of disease. 90   - Able to carry on normal activity; minor signs or symptoms of disease. 80   - Normal activity with effort; some signs or symptoms of disease. 24   - Cares for self; unable to carry on normal activity or to do active work. 60   - Requires occasional assistance, but is able to care for most of his personal needs. 50   - Requires considerable assistance and frequent medical care. 40   - Disabled; requires special care and assistance. 30   - Severely disabled; hospital admission is indicated although death not imminent. 20   - Very sick; hospital admission necessary; active supportive treatment necessary. 10   - Moribund; fatal processes progressing rapidly. 0     - Dead  Karnofsky DA, Abelmann WH, Craver LS and Burchenal [facility] [phone]) The use of the nitrogen mustards in the palliative treatment of carcinoma: with particular reference to  bronchogenic carcinoma  Cancer 1 [id number]  LABORATORY DATA:  Lab Results  Component Value Date   WBC 7.2 12/31/2022   HGB 12.9 12/31/2022   HCT 39.3 12/31/2022   MCV 88.1 12/31/2022   PLT 374 12/31/2022   Lab Results  Component Value Date   NA 134 (L) 12/02/2022   K 4.0 12/02/2022   CL 100 12/02/2022   CO2 26 12/02/2022   Lab Results  Component Value Date   ALT 7 12/02/2022   AST 12 (L) 12/02/2022   ALKPHOS 83 12/02/2022   BILITOT 1.0 12/02/2022     RADIOGRAPHY: NM PET Image Initial (PI) Skull Base To Thigh Result Date: 01/11/2023 CLINICAL DATA:  Subsequent treatment strategy for LEFT upper lobe subpleural mass concerning for bronchogenic carcinoma. Additional history of recently diagnosed colon cancer. EXAM: NUCLEAR MEDICINE PET SKULL BASE TO THIGH TECHNIQUE: 8.5 mCi F-18 FDG was injected intravenously. Full-ring PET imaging was performed from the skull base to thigh after the radiotracer. CT data was obtained and used for attenuation correction and anatomic localization. Fasting blood glucose: 94 mg/dl COMPARISON:  None Available. FINDINGS: Mediastinal blood pool activity: SUV max 1.9 Liver activity: SUV max NA NECK: LEFT supraclavicular node measures 2.1 cm short axis (image 51) with SUV max 11.8. Incidental CT findings: None. CHEST: Intensely hypermetabolic rounded soft tissue mass along the pleural surface of the LEFT upper lobe measures 4.9 cm with SUV max 60.6 (image 67). Questionable tumor involvement of the adjacent second rib. Hypermetabolic LEFT upper lobe perihilar nodule is measures 12 mm on image 74. Hypermetabolic AP window / LEFT lower paratracheal node measures 18 mm with SUV max equal 13.1. Incidental CT findings: None. ABDOMEN/PELVIS: Clear abnormal metabolic activity associated with the colon. No evidence of liver metastasis. Adrenal glands are normal. No hypermetabolic abdominopelvic lymph nodes Incidental CT findings: Postcholecystectomy. SKELETON: No focal  hypermetabolic activity to suggest skeletal metastasis. Incidental CT findings: None. IMPRESSION: 1. Intensely hypermetabolic LEFT upper lobe pleural mass consistent with bronchogenic carcinoma. 2. Large hypermetabolic LEFT supraclavicular and small LEFT mediastinal lymph node consistent with nodal metastasis. 3. Hypermetabolic LEFT upper lobe perihilar nodule is concerning for pulmonary metastasis. 4. No evidence of colon cancer metastasis. Electronically Signed   By: Genevive Bi M.D.   On: 01/11/2023 10:43   MR Brain W Wo Contrast Result Date: 01/07/2023 CLINICAL DATA:  Small cell lung cancer (SCLC), staging EXAM: MRI HEAD WITHOUT AND WITH CONTRAST TECHNIQUE: Multiplanar, multiecho pulse sequences of the brain and surrounding structures were obtained without and with intravenous contrast. CONTRAST:  7.62mL GADAVIST GADOBUTROL 1 MMOL/ML IV SOLN COMPARISON:  None Available. FINDINGS: Brain: No acute infarction, hemorrhage, hydrocephalus, or extra-axial collection. Approximally 4 mm enhancing right periatrial lesion (series 14, image 77). Mild surrounding edema on FLAIR. No significant mass effect. Vascular: Major arterial flow voids are maintained at the skull base. Skull and upper cervical spine: Normal marrow signal. Sinuses/Orbits: Clear sinuses.  No acute orbital findings. Other: No mastoid effusions. IMPRESSION: Approximally 4 mm enhancing right periatrial lesion, concerning for a metastasis given the clinical history. Electronically Signed   By: Feliberto Harts M.D.   On: 01/07/2023 14:12   [place] CORE BIOPSY (LYMPH NODES) Result Date: 12/31/2022 INDICATION: 72 year old female with history of lung nodule, cecal adenocarcinoma, and left supraclavicular lymphadenopathy. EXAM: Ultrasound-guided left supraclavicular lymph node biopsy MEDICATIONS: None. ANESTHESIA/SEDATION: Moderate (conscious) sedation was employed during this procedure. A total of Versed 1 mg and Fentanyl 50 mcg was administered  intravenously. Moderate Sedation Time: 7 minutes. The patient's level of  consciousness and vital signs were monitored continuously by radiology nursing throughout the procedure under my direct supervision. FLUOROSCOPY TIME:  None. COMPLICATIONS: None immediate. PROCEDURE: Informed written consent was obtained from the patient after a thorough discussion of the procedural risks, benefits and alternatives. All questions were addressed. Maximal Sterile Barrier Technique was utilized including caps, mask, sterile gowns, sterile gloves, sterile drape, hand hygiene and skin antiseptic. A timeout was performed prior to the initiation of the procedure. Preprocedure ultrasound evaluation demonstrated a heterogeneously hypoechoic ovoid mass in the left supraclavicular region measuring proximally 2.3 x 2.8 x 3.3 cm. The procedure was planned. The left neck was prepped and draped in standard fashion. Subdermal Local anesthesia was provided at the planned needle entry site with 1% lidocaine. A small skin nick was made. Under direct ultrasound visualization, a 17 gauge coaxial needle was introduced into the periphery of the mass. Next, under direct ultrasound visualization, a total of 3, 18 gauge core biopsies were obtained and placed in formalin. The coaxial needle was removed. Postprocedure ultrasound evaluation demonstrated no evidence of surrounding hematoma or other complicating features. A sterile bandage was applied. The patient tolerated procedure well was transferred to the recovery area in good condition. IMPRESSION: Technically successful ultrasound-guided core biopsy of left supraclavicular lymph node. Marliss Coots, MD [facility] Electronically Signed   By: Marliss Coots M.D.   On: 12/31/2022 16:57      IMPRESSION/PLAN: This visit was conducted via telephone to spare the patient unnecessary potential exposure in the healthcare setting during the current  COVID-19 pandemic. 1. 71 y.o. 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  Today, we talked to the patient about the findings and workup thus far. We discussed the natural history of small cell lung cancer and general treatment, highlighting the role of radiotherapy in the management. We discussed the available radiation techniques, and focused on the details and logistics of delivery.  The recommendation is to proceed with a 6-1/2-week course of concurrent chemoradiation to the chest.  We reviewed the anticipated acute and late sequelae associated with radiation in this setting.   She would also potentially benefit from brain radiation for management of a solitary brain metastasis. The options include whole brain irradiation versus stereotactic radiosurgery. There are pros and cons associated with each of these potential treatment options. Whole brain radiotherapy would treat the known metastatic deposits and help provide some reduction of risk for future brain metastases, particularly in the setting of small cell lung cancer. However, whole brain radiotherapy carries potential risks including hair loss, subacute somnolence, and neurocognitive changes including a possible reduction in short-term memory. Whole brain radiotherapy also may carry a lower likelihood of tumor control at the treatment sites because of the low-dose used. Stereotactic radiosurgery carries a higher likelihood for local tumor control at the targeted sites with lower associated risk for neurocognitive changes such as memory loss. However, the use of stereotactic radiosurgery in this setting may leave the patient at increased risk for new brain metastases elsewhere in the brain as high as 50-60%. Accordingly, patients who receive stereotactic radiosurgery in this setting should undergo ongoing surveillance imaging with brain MRI more frequently in order to identify and treat new small brain  metastases before they become symptomatic. Stereotactic radiosurgery does carry some different risks, including a risk of radionecrosis. We discussed the logistics and delivery and reviewed the results associated with each of the treatments described above. The patient seems  to understand the treatment options and would like to proceed with stereotactic radiosurgery in an effort to prevent any delay in starting the chemoradiation for her small cell lung cancer.  Pending her response to treatment, we did discuss the potential to proceed with prophylactic cranial radiation (PCI) in the future versus continuing with very close monitoring, with serial MRI brain scans every 3 months going forward. The patient was encouraged to ask questions that were answered to her stated satisfaction and she is in agreement and eager to proceed with treatment.   She is tentatively scheduled for CT simulation chest at 9:30 AM on 01/13/2023, in anticipation of beginning her daily chest radiation concurrent with chemotherapy on 01/18/2023.  She is scheduled for an SRS protocol MRI brain scan for treatment planning purposes on Thursday, 01/14/2023 and pending there is no evidence of diffuse brain disease, we will proceed with SRS CT simulation on 01/15/2023 in anticipation of her single fraction SRS treatment on 01/26/2023, in collaboration with Dr. Jordan Likes.  Given current concerns for patient exposure during the COVID-19 pandemic, this encounter was conducted via telephone. The patient was notified in advance and was offered a WebEX meeting to allow for face to face communication but unfortunately reported that he did not have the appropriate resources/technology to support such a visit and instead preferred to proceed with telephone consult. The patient has given verbal consent for this type of encounter. The attendants for this meeting include Margaretmary Dys MD, Ashlyn Bruning PA-C, and patient, DAVEENA KRELLER. During the encounter,  Margaretmary Dys MD and Marcello Fennel PA-C were located at [facility] Radiation Oncology Department.  Patient, LENAH IDOL was located at home.   We personally spent 75 minutes in this encounter including chart review, reviewing radiological studies, meeting face-to-face with the patient, entering orders and completing documentation.      Marguarite Arbour, PA-C    Margaretmary Dys, MD  [facility]  Radiation Oncology Direct Dial: [phone]  Fax: [phone] [email or web address]  Skype  [email or web address]   This document serves as a record of services personally performed by Margaretmary Dys, MD and Marcello Fennel, PA-C. It was created on their behalf by Mickie Bail, a trained medical scribe. The creation of this record is based on the scribe's personal observations and the provider's statements to them. This document has been checked and approved by the attending provider.

## 2023-01-12 NOTE — ED Triage Notes (Addendum)
Home , mechanical fall on buttocks, while trying to pick up dog, on hardwood floor, pt not on thinners. C/o lower back pain 10/10 , EMS gave no meds , A/O X4, - loc , no vomiting , endorses nausea. CBG 128

## 2023-01-12 NOTE — Discharge Instructions (Signed)
Thank you for coming to [facility] Emergency Department. You were seen for fall and back pain. We did an exam, labs, and imaging, and these showed a T12 compression fracture. You have been given a back brace for comfort.   If you are taking oxycodone, please take miralax 1-2 capfuls per day to help prevent constipation. Please be careful while taking oxycodone as it can make you sleepy or more at risk for falls. Please do not drive while taking this medication.   Please follow up with [facility] in the clinic within 1-2 weeks. You can call them tomorrow mornign to make a follow up appointment.   Return to the ED if you develop any of the following: - Fever (100.4 F or 38 C) or chills at home that do not respond to over the counter medications - Weakness, numbness, or tingling in your extremities - Difficulty emptying bladder / urinary incontinence - Fecal incontinence - Uncontrolled nausea/vomiting with inability to keep down liquids - Feeling as though you are going to pass out or passing out - Anything else that concerns you

## 2023-01-13 ENCOUNTER — Ambulatory Visit: Payer: Medicare HMO | Admitting: Internal Medicine

## 2023-01-13 ENCOUNTER — Ambulatory Visit: Payer: Medicare HMO | Admitting: Emergency Medicine

## 2023-01-13 ENCOUNTER — Ambulatory Visit: Payer: Medicare HMO

## 2023-01-13 ENCOUNTER — Ambulatory Visit: Payer: Medicare HMO | Admitting: Radiation Oncology

## 2023-01-13 ENCOUNTER — Other Ambulatory Visit: Payer: Medicare HMO

## 2023-01-13 NOTE — Progress Notes (Unsigned)
I reached out to the pt to see if she would be able to make an appt on 12/18 at 1:30 after her appt with her PCP. She states she already has appts in the morning, but will be able to make it. I let the pt know she will be able to get her labs done at the appt she already has booked for Rad Onc at 9:00. I have added on the labs that she requires for Dr Pollie Friar appt. Pt verbalized understanding. No additional questions at this time.

## 2023-01-14 ENCOUNTER — Ambulatory Visit (HOSPITAL_COMMUNITY): Payer: Medicare HMO

## 2023-01-14 NOTE — Progress Notes (Signed)
Pt called me at 8:07 the morning of 12/18 to let me know that she wouldn't be able make her appts today as she fell at home the previous night and ended up going to the ER. Pt was found to have a compression fracture of the T12. Pt was placed in a TLSO brace and sent home just after midnight. I sent a message to [name] and Dr Kathrynn Running regarding patients condition. At 9:40, I called the pt back to change her new pt appt to 1:30 on 12/23. I called the mobile and house phone. No VM on mobile, however I did leave a VM on the home  number requesting she call me back.  I was notified by [name] via [facility] that the pt wants to reschedule her MRI and SRS for 12/19, but will come in for the chest SIM on Friday. At this time I am still awaiting a call back from the pt.

## 2023-01-14 NOTE — Progress Notes (Signed)
  Radiation Oncology         [phone] ________________________________  Name: Jasmine Long MRN: [id number]  Date: 01/15/2023  DOB: 09/06/1950  SIMULATION AND TREATMENT PLANNING NOTE    ICD-10-CM   1. 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   NARRATIVE:  The patient was brought to the CT Simulation planning suite.  Identity was confirmed.  All relevant records and images related to the planned course of therapy were reviewed.  The patient freely provided informed written consent to proceed with treatment after reviewing the details related to the planned course of therapy. The consent form was witnessed and verified by the simulation staff.  Then, the patient was set-up in a stable reproducible  supine position for radiation therapy.  CT images were obtained.  Surface markings were placed.  The CT images were loaded into the planning software.  Then the target and avoidance structures were contoured.  Treatment planning then occurred.  The radiation prescription was entered and confirmed.  Then, I designed and supervised the construction of a total of 6 medically necessary complex treatment devices, including a BodyFix immobilization mold custom fitted to the patient along with 5 multileaf collimators conformally shaped radiation around the treatment target while shielding critical structures such as the heart and spinal cord maximally.  I have requested : 3D Simulation  I have requested a DVH of the following structures: Left lung, right lung, spinal cord, heart, esophagus, and target.  I have ordered:Nutrition Consult  SPECIAL TREATMENT PROCEDURE:  The planned course of therapy using radiation constitutes a special treatment procedure. Special care is required in the management of this patient for the following reasons.  The patient will be receiving  concurrent chemotherapy requiring careful monitoring for increased toxicities of treatment including periodic laboratory values.  The special nature of the planned course of radiotherapy will require increased physician supervision and oversight to ensure patient's safety with optimal treatment outcomes.  PLAN:  The patient will receive 66 Gy in 33 fractions.  ________________________________  Artist Pais Kathrynn Running, M.D.

## 2023-01-15 ENCOUNTER — Ambulatory Visit
Admission: RE | Admit: 2023-01-15 | Discharge: 2023-01-15 | Disposition: A | Discharge status: Home / Self Care | Payer: Medicare HMO | Source: Ambulatory Visit | Attending: Radiation Oncology | Admitting: Radiation Oncology

## 2023-01-15 ENCOUNTER — Ambulatory Visit: Payer: Medicare HMO | Admitting: Radiation Oncology

## 2023-01-15 ENCOUNTER — Encounter: Payer: Self-pay | Admitting: Family Medicine

## 2023-01-15 ENCOUNTER — Telehealth (INDEPENDENT_AMBULATORY_CARE_PROVIDER_SITE_OTHER): Payer: Medicare HMO | Admitting: Family Medicine

## 2023-01-15 ENCOUNTER — Ambulatory Visit: Payer: Medicare HMO

## 2023-01-15 ENCOUNTER — Other Ambulatory Visit: Payer: Self-pay | Admitting: Urology

## 2023-01-15 ENCOUNTER — Inpatient Hospital Stay: Payer: Medicare HMO

## 2023-01-15 DIAGNOSIS — F17210 Nicotine dependence, cigarettes, uncomplicated: Secondary | ICD-10-CM | POA: Insufficient documentation

## 2023-01-15 DIAGNOSIS — C3411 Malignant neoplasm of upper lobe, right bronchus or lung: Secondary | ICD-10-CM | POA: Diagnosis not present

## 2023-01-15 DIAGNOSIS — C7931 Secondary malignant neoplasm of brain: Secondary | ICD-10-CM | POA: Diagnosis not present

## 2023-01-15 DIAGNOSIS — Z5111 Encounter for antineoplastic chemotherapy: Secondary | ICD-10-CM | POA: Diagnosis not present

## 2023-01-15 DIAGNOSIS — R112 Nausea with vomiting, unspecified: Secondary | ICD-10-CM | POA: Diagnosis not present

## 2023-01-15 DIAGNOSIS — Z79899 Other long term (current) drug therapy: Secondary | ICD-10-CM | POA: Diagnosis not present

## 2023-01-15 DIAGNOSIS — C3490 Malignant neoplasm of unspecified part of unspecified bronchus or lung: Secondary | ICD-10-CM

## 2023-01-15 DIAGNOSIS — C3412 Malignant neoplasm of upper lobe, left bronchus or lung: Secondary | ICD-10-CM | POA: Diagnosis not present

## 2023-01-15 DIAGNOSIS — Z510 Encounter for antineoplastic radiation therapy: Secondary | ICD-10-CM | POA: Diagnosis not present

## 2023-01-15 DIAGNOSIS — K5792 Diverticulitis of intestine, part unspecified, without perforation or abscess without bleeding: Secondary | ICD-10-CM

## 2023-01-15 LAB — BUN & CREATININE (CHCC)
BUN: 8 mg/dL (ref 8–23)
Creatinine: 0.7 mg/dL (ref 0.44–1.00)
GFR, Estimated: >60 mL/min (ref >60)

## 2023-01-15 MED ORDER — ONDANSETRON HCL 4 MG/2ML IJ SOLN: 4.0000 mg | Freq: Once | INTRAMUSCULAR | Status: DC

## 2023-01-15 MED ORDER — ONDANSETRON HCL 4 MG/2ML IJ SOLN
INTRAMUSCULAR | Status: AC
  Filled 2023-01-15: qty 2

## 2023-01-15 MED ORDER — ONDANSETRON HCL 8 MG PO TABS: 8.0000 mg | ORAL_TABLET | Freq: Three times a day (TID) | ORAL | 1 refills | Status: AC | PRN

## 2023-01-15 MED ORDER — AMOXICILLIN-POT CLAVULANATE 875-125 MG PO TABS: 1.0000 | ORAL_TABLET | Freq: Two times a day (BID) | ORAL | 0 refills | Status: AC

## 2023-01-15 NOTE — Progress Notes (Signed)
Patient over in CT sim and started to vomit. PA Ashlyn Bruining in with patient at the time of vomiting. PA gave verbal order for Zofran 4mg  IM x1. Medication pulled from pyxis and was drawn up. Went to clean patients arm, patient refused stating, " I think I feel better now." Medication wasted. PA made aware.

## 2023-01-15 NOTE — Progress Notes (Signed)
Virtual Visit via Video Note  I connected with Jasmine Long on 01/15/23 at  4:20 PM EST by a video enabled telemedicine application and verified that I am speaking with the correct person using two identifiers.  Patient Location: Home Provider Location: Office/Clinic  I discussed the limitations, risks, security, and privacy concerns of performing an evaluation and management service by video and the availability of in person appointments. I also discussed with the patient that there may be a patient responsible charge related to this service. The patient expressed understanding and agreed to proceed.  Subjective: PCP: Malachy Mood, MD  No chief complaint on file.  HPI  Female presents for evaluation of sharp abdominal pain, nausea, vomiting sometimes over the last couple of weeks that has intensified. She has complex medical history as outlined below.  Of note, she does have history of diverticulitis, thinks that this is a diverticulitis flareup. Has been eating a very bland diet, not much at all today. States that this has not helped much at all. Denies diarrhea, fever, chills, other symptoms.    ROS: Per HPI  Current Outpatient Medications:    amoxicillin-clavulanate (AUGMENTIN) 875-125 MG tablet, Take 1 tablet by mouth every 12 (twelve) hours for 7 days., Disp: 14 tablet, Rfl: 0   ALPRAZolam (XANAX) 0.5 MG tablet, Take 1 tablet (0.5 mg total) by mouth 2 (two) times daily as needed for anxiety., Disp: 30 tablet, Rfl: 1   atorvastatin (LIPITOR) 20 MG tablet, Take 1 tablet by mouth once daily, Disp: 90 tablet, Rfl: 1   buPROPion (WELLBUTRIN XL) 300 MG 24 hr tablet, Take 1 tablet by mouth once daily, Disp: 90 tablet, Rfl: 1   busPIRone (BUSPAR) 7.5 MG tablet, Take 1 tablet (7.5 mg total) by mouth 2 (two) times daily., Disp: 60 tablet, Rfl: 1   escitalopram (LEXAPRO) 10 MG tablet, Take 1 tablet (10 mg total) by mouth daily., Disp: 90 tablet, Rfl: 3   gabapentin (NEURONTIN) 300 MG  capsule, Take 300 mg by mouth as needed (numbness)., Disp: , Rfl:    levothyroxine (SYNTHROID) 125 MCG tablet, TAKE 1 TABLET BY MOUTH ONCE DAILY BEFORE BREAKFAST, Disp: 90 tablet, Rfl: 0   lidocaine (LIDODERM) 5 %, Place 1 patch onto the skin daily. Remove & Discard patch within 12 hours or as directed by MD, Disp: 30 patch, Rfl: 0   metFORMIN (GLUCOPHAGE) 1000 MG tablet, TAKE 1 TABLET BY MOUTH TWICE DAILY WITH A MEAL, Disp: 180 tablet, Rfl: 1   metoprolol succinate (TOPROL-XL) 25 MG 24 hr tablet, Take 1 tablet by mouth once daily, Disp: 90 tablet, Rfl: 3   nicotine (NICODERM CQ) 21 mg/24hr patch, Place 1 patch (21 mg total) onto the skin daily., Disp: 28 patch, Rfl: 3   ondansetron (ZOFRAN) 8 MG tablet, Take 1 tablet (8 mg total) by mouth every 8 (eight) hours as needed for nausea or vomiting., Disp: 30 tablet, Rfl: 1   oxyCODONE (ROXICODONE) 5 MG immediate release tablet, Take 1 tablet (5 mg total) by mouth every 4 (four) hours as needed for up to 3 days for severe pain (pain score 7-10)., Disp: 15 tablet, Rfl: 0   pantoprazole (PROTONIX) 40 MG tablet, Take 1 tablet twice a day for 2 weeks and then daily after that, Disp: 90 tablet, Rfl: 3   valsartan-hydrochlorothiazide (DIOVAN-HCT) 160-12.5 MG tablet, Take 1 tablet by mouth once daily, Disp: 90 tablet, Rfl: 3  Observations/Objective: There were no vitals filed for this visit. Physical Exam  She was  unable to obtain vital signs. No physical exam was able to be performed due to the nature of telephone call visit.  Assessment and Plan: Acute diverticulitis -     Amoxicillin-Pot Clavulanate; Take 1 tablet by mouth every 12 (twelve) hours for 7 days.  Dispense: 14 tablet; Refill: 0  Nausea and vomiting, unspecified vomiting type    Follow Up Instructions: Return if symptoms worsen or fail to improve.   I discussed the assessment and treatment plan with the patient. The patient was provided an opportunity to ask questions, and all were  answered. The patient agreed with the plan and demonstrated an understanding of the instructions.   The patient was advised to call back or seek an in-person evaluation if the symptoms worsen or if the condition fails to improve as anticipated.  The above assessment and management plan was discussed with the patient. The patient verbalized understanding of and has agreed to the management plan.   Moshe Cipro, FNP

## 2023-01-17 DIAGNOSIS — Z79899 Other long term (current) drug therapy: Secondary | ICD-10-CM | POA: Diagnosis not present

## 2023-01-17 DIAGNOSIS — Z510 Encounter for antineoplastic radiation therapy: Secondary | ICD-10-CM | POA: Diagnosis not present

## 2023-01-17 DIAGNOSIS — C3412 Malignant neoplasm of upper lobe, left bronchus or lung: Secondary | ICD-10-CM | POA: Diagnosis not present

## 2023-01-17 DIAGNOSIS — C7931 Secondary malignant neoplasm of brain: Secondary | ICD-10-CM | POA: Diagnosis not present

## 2023-01-17 DIAGNOSIS — F17210 Nicotine dependence, cigarettes, uncomplicated: Secondary | ICD-10-CM | POA: Diagnosis not present

## 2023-01-17 DIAGNOSIS — C3411 Malignant neoplasm of upper lobe, right bronchus or lung: Secondary | ICD-10-CM | POA: Diagnosis not present

## 2023-01-17 DIAGNOSIS — Z5111 Encounter for antineoplastic chemotherapy: Secondary | ICD-10-CM | POA: Diagnosis not present

## 2023-01-18 ENCOUNTER — Other Ambulatory Visit: Payer: Self-pay

## 2023-01-18 ENCOUNTER — Inpatient Hospital Stay: Payer: Medicare HMO | Admitting: Internal Medicine

## 2023-01-18 ENCOUNTER — Ambulatory Visit
Admission: RE | Admit: 2023-01-18 | Discharge: 2023-01-18 | Disposition: A | Discharge status: Home / Self Care | Payer: Medicare HMO | Source: Ambulatory Visit | Attending: Radiation Oncology | Admitting: Radiation Oncology

## 2023-01-18 VITALS — BP 155/70 | HR 97 | Temp 97.4°F | Resp 16 | Ht 69.0 in | Wt 169.7 lb

## 2023-01-18 DIAGNOSIS — Z5111 Encounter for antineoplastic chemotherapy: Secondary | ICD-10-CM | POA: Diagnosis not present

## 2023-01-18 DIAGNOSIS — C3412 Malignant neoplasm of upper lobe, left bronchus or lung: Secondary | ICD-10-CM | POA: Diagnosis not present

## 2023-01-18 DIAGNOSIS — Z510 Encounter for antineoplastic radiation therapy: Secondary | ICD-10-CM | POA: Diagnosis not present

## 2023-01-18 DIAGNOSIS — F17210 Nicotine dependence, cigarettes, uncomplicated: Secondary | ICD-10-CM | POA: Diagnosis not present

## 2023-01-18 DIAGNOSIS — C3411 Malignant neoplasm of upper lobe, right bronchus or lung: Secondary | ICD-10-CM | POA: Diagnosis not present

## 2023-01-18 DIAGNOSIS — C7931 Secondary malignant neoplasm of brain: Secondary | ICD-10-CM | POA: Diagnosis not present

## 2023-01-18 DIAGNOSIS — Z79899 Other long term (current) drug therapy: Secondary | ICD-10-CM | POA: Diagnosis not present

## 2023-01-18 LAB — RAD ONC ARIA SESSION SUMMARY
Course Elapsed Days: 0
Plan Fractions Treated to Date: 1
Plan Prescribed Dose Per Fraction: 2 Gy
Plan Total Fractions Prescribed: 33
Plan Total Prescribed Dose: 66 Gy
Reference Point Dosage Given to Date: 2 Gy
Reference Point Session Dosage Given: 2 Gy
Session Number: 1

## 2023-01-18 MED ORDER — ONDANSETRON HCL 8 MG PO TABS: 8.0000 mg | ORAL_TABLET | Freq: Three times a day (TID) | ORAL | 1 refills | Status: DC | PRN

## 2023-01-18 MED ORDER — PROCHLORPERAZINE MALEATE 10 MG PO TABS: 10.0000 mg | ORAL_TABLET | Freq: Four times a day (QID) | ORAL | 1 refills | Status: DC | PRN

## 2023-01-18 NOTE — Progress Notes (Signed)
START ON PATHWAY REGIMEN - Small Cell Lung     A cycle is every 21 days:     Carboplatin      Etoposide   **Always confirm dose/schedule in your pharmacy ordering system**  Patient Characteristics: Newly Diagnosed, Preoperative or Nonsurgical Candidate (Clinical Staging), First Line, Extensive Stage Therapeutic Status: Newly Diagnosed, Preoperative or Nonsurgical Candidate (Clinical Staging) AJCC T Category: cT2b AJCC N Category: cN3 AJCC M Category: cM1b AJCC 8 Stage Grouping: IVA Stage Classification: Extensive  Intent of Therapy: Non-Curative / Palliative Intent, Discussed with Patient

## 2023-01-18 NOTE — Progress Notes (Signed)
[facility] Telephone:([phone]) [phone]   Fax:([phone]) [phone]  CONSULT NOTE  REFERRING PHYSICIAN: Dr. Malachy Mood  REASON FOR CONSULTATION:  72 year old white female recently diagnosed with lung cancer  HPI Jasmine Long is a 72 y.o. female came to the clinic today accompanied by her son Jasmine Long for initial evaluation after she was diagnosed with small cell lung cancer. Discussed the use of AI scribe software for clinical note transcription with the patient, who gave verbal consent to proceed.  History of Present Illness   The patient, a 72 year old with a recent diagnosis of small cell lung cancer, presents for an initial consultation. The diagnosis was discovered incidentally during a workup for gastrointestinal issues, initially thought to be related to diverticulitis. The patient had been experiencing stomach discomfort and unexplained weight loss, which prompted a colonoscopy. The colonoscopy revealed a large polyp, which was later removed and found to be adenocarcinoma of the colon.  Subsequent scans of the abdomen and pelvis were unremarkable, but a chest scan revealed a sizable mass in the anterior left upper lobe of the lung, along with new lymph nodes in the left supraclavicular and mediastinal areas. A biopsy of the left supraclavicular lymph node confirmed the diagnosis of small cell lung cancer. An MRI of the brain also revealed a 4mm lesion in the right parietal area.  The patient reports significant back and rib pain, which has been exacerbated by a recent compression fracture at the T12 vertebrae following a fall. The patient is currently wearing a back brace for support, but reports that it causes discomfort and difficulty breathing. The patient denies any coughing, blood, nausea, vomiting, or constipation, but does report a loss of appetite due to stomach discomfort.  The patient has a history of depression, anxiety, diabetes, Long blood pressure, and hypothyroidism.  The patient also has a history of smoking for approximately 50 years, but is currently trying to quit. The patient denies any alcohol or drug use. The patient's mother had lung cancer, but there is no other family history of cancer. The patient is divorced with one living son and is a retired right of way agent with the [employer].      HPI  Past Medical History:  Diagnosis Date   Allergy    Anxiety    Depression    Diabetes mellitus without complication (HCC)    Heart murmur    Hypertension    Thyroid disease    Ulcer     Past Surgical History:  Procedure Laterality Date   BREAST SURGERY     CESAREAN SECTION     CHOLECYSTECTOMY     COLONOSCOPY WITH PROPOFOL N/A 10/30/2022   Procedure: COLONOSCOPY WITH PROPOFOL;  Surgeon: Jeani Hawking, MD;  Location: [facility];  Service: Gastroenterology;  Laterality: N/A;   ENDOSCOPIC MUCOSAL RESECTION  10/30/2022   Procedure: ENDOSCOPIC MUCOSAL RESECTION;  Surgeon: Jeani Hawking, MD;  Location: [facility];  Service: Gastroenterology;;   HEMOSTASIS CLIP PLACEMENT  10/30/2022   Procedure: HEMOSTASIS CLIP PLACEMENT;  Surgeon: Jeani Hawking, MD;  Location: [facility];  Service: Gastroenterology;;   NASAL SEPTUM SURGERY     POLYPECTOMY  10/30/2022   Procedure: POLYPECTOMY;  Surgeon: Jeani Hawking, MD;  Location: [facility];  Service: Gastroenterology;;   Sunnie Nielsen LIFTING INJECTION  10/30/2022   Procedure: SUBMUCOSAL LIFTING INJECTION;  Surgeon: Jeani Hawking, MD;  Location: [facility];  Service: Gastroenterology;;    Family History  Problem Relation Age of Onset   Lung  cancer Mother 59   Dementia Father    Atrial fibrillation Father    Cancer Paternal Aunt        breast cancer   Colon cancer Paternal Aunt    Colon cancer Cousin     Social History Social History   Tobacco Use   Smoking status: Every Day    Current packs/day: 0.00    Average packs/day: 1.5 packs/day for 40.0 years (60.0 ttl  pk-yrs)    Types: Cigarettes    Start date: 01/20/1972    Last attempt to quit: 01/20/2012    Years since quitting: 11.0   Smokeless tobacco: Never   Tobacco comments:    Pt doing E-cigarette as of 01/20/12  Vaping Use   Vaping status: Former  Substance Use Topics   Alcohol use: No   Drug use: No    No Known Allergies  Current Outpatient Medications  Medication Sig Dispense Refill   ALPRAZolam (XANAX) 0.5 MG tablet Take 1 tablet (0.5 mg total) by mouth 2 (two) times daily as needed for anxiety. 30 tablet 1   amoxicillin-clavulanate (AUGMENTIN) 875-125 MG tablet Take 1 tablet by mouth every 12 (twelve) hours for 7 days. 14 tablet 0   atorvastatin (LIPITOR) 20 MG tablet Take 1 tablet by mouth once daily 90 tablet 1   buPROPion (WELLBUTRIN XL) 300 MG 24 hr tablet Take 1 tablet by mouth once daily 90 tablet 1   busPIRone (BUSPAR) 7.5 MG tablet Take 1 tablet (7.5 mg total) by mouth 2 (two) times daily. 60 tablet 1   escitalopram (LEXAPRO) 10 MG tablet Take 1 tablet (10 mg total) by mouth daily. 90 tablet 3   gabapentin (NEURONTIN) 300 MG capsule Take 300 mg by mouth as needed (numbness).     levothyroxine (SYNTHROID) 125 MCG tablet TAKE 1 TABLET BY MOUTH ONCE DAILY BEFORE BREAKFAST 90 tablet 0   lidocaine (LIDODERM) 5 % Place 1 patch onto the skin daily. Remove & Discard patch within 12 hours or as directed by MD 30 patch 0   metFORMIN (GLUCOPHAGE) 1000 MG tablet TAKE 1 TABLET BY MOUTH TWICE DAILY WITH A MEAL 180 tablet 1   metoprolol succinate (TOPROL-XL) 25 MG 24 hr tablet Take 1 tablet by mouth once daily 90 tablet 3   nicotine (NICODERM CQ) 21 mg/24hr patch Place 1 patch (21 mg total) onto the skin daily. 28 patch 3   ondansetron (ZOFRAN) 8 MG tablet Take 1 tablet (8 mg total) by mouth every 8 (eight) hours as needed for nausea or vomiting. 30 tablet 1   pantoprazole (PROTONIX) 40 MG tablet Take 1 tablet twice a day for 2 weeks and then daily after that 90 tablet 3    valsartan-hydrochlorothiazide (DIOVAN-HCT) 160-12.5 MG tablet Take 1 tablet by mouth once daily 90 tablet 3   No current facility-administered medications for this visit.    Review of Systems  Constitutional: positive for fatigue and weight loss Eyes: negative Ears, nose, mouth, throat, and face: negative Respiratory: positive for cough Cardiovascular: negative Gastrointestinal: negative Genitourinary:negative Integument/breast: negative Hematologic/lymphatic: negative Musculoskeletal:positive for back pain Neurological: negative Behavioral/Psych: negative Endocrine: negative Allergic/Immunologic: negative  Physical Exam  ZOX:WRUEA, healthy, no distress, well nourished, and well developed SKIN: skin color, texture, turgor are normal, no rashes or significant lesions HEAD: Normocephalic, No masses, lesions, tenderness or abnormalities EYES: normal, PERRLA, Conjunctiva are pink and non-injected EARS: External ears normal, Canals clear OROPHARYNX:no exudate, no erythema, and lips, buccal mucosa, and tongue normal  NECK: supple, no adenopathy,  no JVD LYMPH:  no palpable lymphadenopathy, no hepatosplenomegaly BREAST:not examined LUNGS: clear to auscultation , and palpation HEART: regular rate & rhythm, no murmurs, and no gallops ABDOMEN:abdomen soft, non-tender, normal bowel sounds, and no masses or organomegaly BACK: Back symmetric, no curvature., No CVA tenderness EXTREMITIES:no joint deformities, effusion, or inflammation, no edema  NEURO: alert & oriented x 3 with fluent speech, no focal motor/sensory deficits  PERFORMANCE STATUS: ECOG 1  LABORATORY DATA: Lab Results  Component Value Date   WBC 7.2 12/31/2022   HGB 12.9 12/31/2022   HCT 39.3 12/31/2022   MCV 88.1 12/31/2022   PLT 374 12/31/2022      Chemistry      Component Value Date/Time   NA 134 (L) 12/02/2022 1614   NA 134 09/11/2022 1259   K 4.0 12/02/2022 1614   CL 100 12/02/2022 1614   CO2 26  12/02/2022 1614   BUN 8 01/15/2023 1014   BUN 12 09/11/2022 1259   CREATININE 0.70 01/15/2023 1014   CREATININE 0.98 10/30/2013 1118      Component Value Date/Time   CALCIUM 10.0 12/02/2022 1614   ALKPHOS 83 12/02/2022 1614   AST 12 (L) 12/02/2022 1614   ALT 7 12/02/2022 1614   BILITOT 1.0 12/02/2022 1614   BILITOT 0.8 09/11/2022 1259       RADIOGRAPHIC STUDIES: CT Lumbar Spine Wo Contrast Result Date: 01/12/2023 CLINICAL DATA:  Fall EXAM: CT THORACIC AND LUMBAR SPINE WITHOUT CONTRAST TECHNIQUE: Multidetector CT imaging of the thoracic and lumbar spine was performed without contrast. Multiplanar CT image reconstructions were also generated. RADIATION DOSE REDUCTION: This exam was performed according to the departmental dose-optimization program which includes automated exposure control, adjustment of the mA and/or kV according to patient size and/or use of iterative reconstruction technique. COMPARISON:  Chest CT 11/27/2022 FINDINGS: CT THORACIC SPINE FINDINGS Alignment: Normal Vertebrae: T12 wedge compression fracture with approximately 25% anterior height loss. No retropulsion. No other thoracic spine fracture. Bulky anterior osteophytes at the cervicothoracic junction. Paraspinal and other soft tissues: Left upper lobe subpleural mass incompletely visualized. Disc levels: No spinal canal stenosis. CT LUMBAR SPINE FINDINGS Segmentation: 5 lumbar type vertebrae. Alignment: Normal. Vertebrae: No acute fracture or focal pathologic process. Paraspinal and other soft tissues: Calcific aortic atherosclerosis. Sigmoid diverticulosis. Disc levels: No spinal canal stenosis. IMPRESSION: 1. T12 wedge compression fracture with approximately 25% anterior height loss. No retropulsion. 2. No acute fracture or static subluxation of the lumbar spine. 3. Left upper lobe subpleural mass incompletely visualized. Aortic Atherosclerosis (ICD10-I70.0). Electronically Signed   By: Deatra Robinson M.D.   On: 01/12/2023  21:43   CT Thoracic Spine Wo Contrast Result Date: 01/12/2023 CLINICAL DATA:  Fall EXAM: CT THORACIC AND LUMBAR SPINE WITHOUT CONTRAST TECHNIQUE: Multidetector CT imaging of the thoracic and lumbar spine was performed without contrast. Multiplanar CT image reconstructions were also generated. RADIATION DOSE REDUCTION: This exam was performed according to the departmental dose-optimization program which includes automated exposure control, adjustment of the mA and/or kV according to patient size and/or use of iterative reconstruction technique. COMPARISON:  Chest CT 11/27/2022 FINDINGS: CT THORACIC SPINE FINDINGS Alignment: Normal Vertebrae: T12 wedge compression fracture with approximately 25% anterior height loss. No retropulsion. No other thoracic spine fracture. Bulky anterior osteophytes at the cervicothoracic junction. Paraspinal and other soft tissues: Left upper lobe subpleural mass incompletely visualized. Disc levels: No spinal canal stenosis. CT LUMBAR SPINE FINDINGS Segmentation: 5 lumbar type vertebrae. Alignment: Normal. Vertebrae: No acute fracture or focal pathologic process. Paraspinal and  other soft tissues: Calcific aortic atherosclerosis. Sigmoid diverticulosis. Disc levels: No spinal canal stenosis. IMPRESSION: 1. T12 wedge compression fracture with approximately 25% anterior height loss. No retropulsion. 2. No acute fracture or static subluxation of the lumbar spine. 3. Left upper lobe subpleural mass incompletely visualized. Aortic Atherosclerosis (ICD10-I70.0). Electronically Signed   By: Deatra Robinson M.D.   On: 01/12/2023 21:43   NM PET Image Initial (PI) Skull Base To Thigh Result Date: 01/11/2023 CLINICAL DATA:  Subsequent treatment strategy for LEFT upper lobe subpleural mass concerning for bronchogenic carcinoma. Additional history of recently diagnosed colon cancer. EXAM: NUCLEAR MEDICINE PET SKULL BASE TO THIGH TECHNIQUE: 8.5 mCi F-18 FDG was injected intravenously. Full-ring  PET imaging was performed from the skull base to thigh after the radiotracer. CT data was obtained and used for attenuation correction and anatomic localization. Fasting blood glucose: 94 mg/dl COMPARISON:  None Available. FINDINGS: Mediastinal blood pool activity: SUV max 1.9 Liver activity: SUV max NA NECK: LEFT supraclavicular node measures 2.1 cm short axis (image 51) with SUV max 11.8. Incidental CT findings: None. CHEST: Intensely hypermetabolic rounded soft tissue mass along the pleural surface of the LEFT upper lobe measures 4.9 cm with SUV max 60.6 (image 67). Questionable tumor involvement of the adjacent second rib. Hypermetabolic LEFT upper lobe perihilar nodule is measures 12 mm on image 74. Hypermetabolic AP window / LEFT lower paratracheal node measures 18 mm with SUV max equal 13.1. Incidental CT findings: None. ABDOMEN/PELVIS: Clear abnormal metabolic activity associated with the colon. No evidence of liver metastasis. Adrenal glands are normal. No hypermetabolic abdominopelvic lymph nodes Incidental CT findings: Postcholecystectomy. SKELETON: No focal hypermetabolic activity to suggest skeletal metastasis. Incidental CT findings: None. IMPRESSION: 1. Intensely hypermetabolic LEFT upper lobe pleural mass consistent with bronchogenic carcinoma. 2. Large hypermetabolic LEFT supraclavicular and small LEFT mediastinal lymph node consistent with nodal metastasis. 3. Hypermetabolic LEFT upper lobe perihilar nodule is concerning for pulmonary metastasis. 4. No evidence of colon cancer metastasis. Electronically Signed   By: Genevive Bi M.D.   On: 01/11/2023 10:43   MR Brain W Wo Contrast Result Date: 01/07/2023 CLINICAL DATA:  Small cell lung cancer (SCLC), staging EXAM: MRI HEAD WITHOUT AND WITH CONTRAST TECHNIQUE: Multiplanar, multiecho pulse sequences of the brain and surrounding structures were obtained without and with intravenous contrast. CONTRAST:  7.54mL GADAVIST GADOBUTROL 1 MMOL/ML IV  SOLN COMPARISON:  None Available. FINDINGS: Brain: No acute infarction, hemorrhage, hydrocephalus, or extra-axial collection. Approximally 4 mm enhancing right periatrial lesion (series 14, image 77). Mild surrounding edema on FLAIR. No significant mass effect. Vascular: Major arterial flow voids are maintained at the skull base. Skull and upper cervical spine: Normal marrow signal. Sinuses/Orbits: Clear sinuses.  No acute orbital findings. Other: No mastoid effusions. IMPRESSION: Approximally 4 mm enhancing right periatrial lesion, concerning for a metastasis given the clinical history. Electronically Signed   By: Feliberto Harts M.D.   On: 01/07/2023 14:12   [place] CORE BIOPSY (LYMPH NODES) Result Date: 12/31/2022 INDICATION: 72 year old female with history of lung nodule, cecal adenocarcinoma, and left supraclavicular lymphadenopathy. EXAM: Ultrasound-guided left supraclavicular lymph node biopsy MEDICATIONS: None. ANESTHESIA/SEDATION: Moderate (conscious) sedation was employed during this procedure. A total of Versed 1 mg and Fentanyl 50 mcg was administered intravenously. Moderate Sedation Time: 7 minutes. The patient's level of consciousness and vital signs were monitored continuously by radiology nursing throughout the procedure under my direct supervision. FLUOROSCOPY TIME:  None. COMPLICATIONS: None immediate. PROCEDURE: Informed written consent was obtained from the patient after a  thorough discussion of the procedural risks, benefits and alternatives. All questions were addressed. Maximal Sterile Barrier Technique was utilized including caps, mask, sterile gowns, sterile gloves, sterile drape, hand hygiene and skin antiseptic. A timeout was performed prior to the initiation of the procedure. Preprocedure ultrasound evaluation demonstrated a heterogeneously hypoechoic ovoid mass in the left supraclavicular region measuring proximally 2.3 x 2.8 x 3.3 cm. The procedure was planned. The left neck was prepped  and draped in standard fashion. Subdermal Local anesthesia was provided at the planned needle entry site with 1% lidocaine. A small skin nick was made. Under direct ultrasound visualization, a 17 gauge coaxial needle was introduced into the periphery of the mass. Next, under direct ultrasound visualization, a total of 3, 18 gauge core biopsies were obtained and placed in formalin. The coaxial needle was removed. Postprocedure ultrasound evaluation demonstrated no evidence of surrounding hematoma or other complicating features. A sterile bandage was applied. The patient tolerated procedure well was transferred to the recovery area in good condition. IMPRESSION: Technically successful ultrasound-guided core biopsy of left supraclavicular lymph node. Marliss Coots, MD [facility] Electronically Signed   By: Marliss Coots M.D.   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 The patient also has early stage colon adenocarcinoma diagnosed on colonoscopy but surgical resection is currently on hold until management of her small cell lung cancer.   PLAN: I had a lengthy discussion with the patient and her son today about her current condition, prognosis and treatment options.  I personally and independently reviewed the scan images and showed the images to the patient and her son.    Small Cell Lung Cancer Extensive stage small cell lung cancer with a large mass in the anterior left upper lobe, lymph node involvement in the left supraclavicular and mediastinal areas, and a 4 mm lesion in the right parietal area of the brain. History of  smoking for approximately 50 years. Discussed aggressive nature, survival rates (6 weeks to 3 months without treatment, ~13 months with treatment), and importance of timely treatment. Risks of chemotherapy include alopecia, nausea, vomiting, fatigue, and potential nephrotoxicity and hepatotoxicity. Surgery not an option due to advanced stage. Immune therapy to be held until post-radiation to avoid pneumonitis. - Administer chemotherapy with carboplatin and etoposide for three days every three weeks for four cycles - Administer radiation therapy Monday through Friday - Hold immune therapy until post-radiation - Consider durvalumab (Imfinzi) as consolidation treatment post-chemotherapy and radiation - Provide antiemetics for home use - Schedule chemotherapy to start next week - Provide educational session on chemotherapy before starting treatment  Compression Fracture of T12 Vertebra Compression fracture of T12 vertebra, likely due to a fall. Significant back and rib pain, exacerbated by an ill-fitting back brace. No orthopedic or neurosurgical evaluation yet. - Refer to Dr. Dutch Quint (neurosurgeon) for evaluation and management - Consider a more comfortable back brace  Colon Adenocarcinoma Adenocarcinoma of the colon diagnosed in July, polyp removed in October, followed by colectomy. No evidence of metastasis on recent scans. - Continue monitoring for recurrence or metastasis  Diverticulitis Diverticulitis causing nausea and burning sensation when eating. Current treatment with Augmentin and antiemetics has alleviated symptoms. -  Continue Augmentin - Use antiemetics as needed  General Health Maintenance Depression, anxiety, diabetes, hypertension, hypothyroidism, and a systolic heart murmur. Lost approximately 100 pounds since onset of colon issues. Attempting smoking cessation using a Juul. - Encourage smoking cessation and continue Juul use to reduce cigarette consumption - Monitor blood  pressure and blood glucose levels regularly - Continue current medications for depression, anxiety, diabetes, hypertension, and hypothyroidism - Schedule regular follow-ups with primary care physician for ongoing management of chronic conditions  Follow-up - Follow up with Dr. Dutch Quint (neurosurgeon) for T12 compression fracture - Schedule chemotherapy to start next week - Coordinate with radiation therapy schedule - Provide educational session on chemotherapy before starting treatment - Monitor for side effects of chemotherapy and radiation - Consider second opinion or referral to [facility] or other institutions if desired.    The son is very interested in considering alternative medicine for his mother and has been searching the Internet for other options.  He came with the idea of treatment with carbon 60 that is available in [place] but not patent or FDA approved.  I explained to the patient that I am not familiar with any approval or guideline for this product.  I also explained to him that if he is interested they can always go to [place] seeking this kind of treatment if available.  After the discussion the patient mentions that she is interested in proceeding with the standard treatment here in [place] and she is expected to start the first cycle of her chemotherapy next week. She was advised to call immediately if she has any other concerning symptoms in the interval. The patient voices understanding of current disease status and treatment options and is in agreement with the current care plan.  All questions were answered. The patient knows to call the clinic with any problems, questions or concerns. We can certainly see the patient much sooner if necessary.  Thank you so much for allowing me to participate in the care of Jasmine Long. I will continue to follow up the patient with you and assist in her care.  The total time spent in the appointment was 90 minutes.  Disclaimer: This note was  dictated with voice recognition software. Similar sounding words can inadvertently be transcribed and may not be corrected upon review.   Lajuana Matte January 18, 2023, 1:53 PM

## 2023-01-19 ENCOUNTER — Ambulatory Visit: Payer: Medicare HMO

## 2023-01-19 ENCOUNTER — Encounter: Payer: Self-pay | Admitting: Internal Medicine

## 2023-01-19 NOTE — Progress Notes (Signed)
4:10 I reached out to [facility] 2 to see if her radiation treatment time can be moved to before 9am as there is a pt education class she needs to take. Spoke to technician Spring Mount who offered an 845 appt or an 1145. I asked her to move the pt to the 845 appt.  4:18 I called Dr pools office to let him know about the pt's injury and that the ER wanted her to follow up in 1-2 weeks, however she is in a lot of discomfort from the brace she has to wear and was hoping to see him sooner and change the pain medication she is on, as she no long wants to be on a narcotic. I left a message with the front desk nurse who passed the information on to his NP as Dr Jordan Likes is on leave at this time for the holidays.  4:25 I tried to reach out to the pt at both numbers in her demographics to inform her about the changes to her schedule and the message to dr pool. No answer at her phone. I will ring her again on 12/24.

## 2023-01-19 NOTE — Progress Notes (Addendum)
I met the Jasmine Long face to face today at her consult with Dr. Arbutus Ped. Jasmine Long was accompanied by her son, Jasmine Long. Jasmine Long is recently diagnosed with extensive stage small cell lung cancer. The plan for the Jasmine Long is to start chemotherapy with carboplatin and etoposide every 3 weeks for 4 cycles current with radiation, likely followed by immunotherapy after radiation is complete. I explained to the Jasmine Long that she will need a chemotherapy education class prior to starting her chemotherapy, which will be the week of 12/30 and I will arrange that for her. Jasmine Long was offered a port placement and declined at this time.   Jasmine Long asked if I could reach out to her neurosurgeon, Dr Jordan Likes, to see if she could get an appt with him as the TLSO brace she has to wear r/t the fall she experienced is causing her a great deal of discomfort. I let the Jasmine Long know I would reach out to his office. I provided Jasmine Long and her son with my business card and encouraged them to call me with any concerns.

## 2023-01-20 ENCOUNTER — Other Ambulatory Visit: Payer: Self-pay

## 2023-01-21 ENCOUNTER — Encounter: Payer: Self-pay | Admitting: Internal Medicine

## 2023-01-21 ENCOUNTER — Other Ambulatory Visit: Payer: Medicare HMO

## 2023-01-21 ENCOUNTER — Ambulatory Visit: Payer: Medicare HMO

## 2023-01-21 NOTE — Progress Notes (Signed)
Patient called me twice leaving 2 VMs the morning of 12/24 to inform me that she was not going to be able to make her rad onc treatment at 7am because she is in too much pain. Pt recently sustained a compression fracture of her T12 vertebrae after falling at home on 12/17 and has subsequently been needing to wear a TLSO brace. Pt states the technicians who helped her off the table after her treatment on 12/23 assisted her off the table incorrectly, pulling her to a sitting up straight as opposed to letting her roll to her side and then sit up. Pt reports feeling like she wasn't listened to and her pain is now worse. Pt requested that the technicians who worked with her during that treatment not work with her again. I verbalized understanding of the pts concerns and let her know I would reach out to radiation oncology to share her concerns with them.

## 2023-01-21 NOTE — Progress Notes (Signed)
On 12:24 at 9:54, I reached out to Dr Broadus John nurse, Marcelino Duster, to let her know that the pt wishes not work with the technicians who worked with her at her appt on 12/23 because of the way she was helped off the table as she now has increased amount of back pain. Marcelino Duster took the message and states she will pass it onto the radiation team.

## 2023-01-22 ENCOUNTER — Ambulatory Visit (HOSPITAL_COMMUNITY): Payer: Medicare HMO

## 2023-01-22 ENCOUNTER — Other Ambulatory Visit: Payer: Medicare HMO

## 2023-01-22 ENCOUNTER — Ambulatory Visit: Payer: Medicare HMO

## 2023-01-22 ENCOUNTER — Encounter: Payer: Self-pay | Admitting: Internal Medicine

## 2023-01-22 ENCOUNTER — Telehealth: Payer: Self-pay | Admitting: Radiation Therapy

## 2023-01-22 MED FILL — Fosaprepitant Dimeglumine For IV Infusion 150 MG (Base Eq): INTRAVENOUS | Qty: 5 | Status: AC

## 2023-01-22 NOTE — Telephone Encounter (Signed)
I called the patient to update her on the planning MRI she asked to be rescheduled from 1/27. I moved this appointment to 1/2 with simulation on 1/3. Unfortunately Ms. Jasmine Long does not want to attempt an MRI scan until the week of 1/6, and said she is really not sure if she will be able to do it then.   She is concerned about laying on her back for any extended amount of time and is not willing to take the pain medication prescribed. She does not feel that it offers relief, just makes her "loopy and sound stupid".   I will push things out per her request and notify Dr. Kathrynn Running and Dr. Jordan Likes.   [name] R.T.(R)(T) Radiation Special Procedures Navigator

## 2023-01-22 NOTE — Progress Notes (Signed)
Pt called me at this time to request that Dr Arbutus Ped place a referral for a 2nd opinion about her cancer. I asked her if she had a location in mind and she said [facility]. She's hoping to get into a trial. I asked her if she had looked to see if there were any clinical trials at [facility] and she wasn't sure. I told her Dr Arbutus Ped isn't here today, but that I will discuss her request with him when I see him on Monday. I confirmed with the pt that she still plans on receiving treatment this Monday despite wanting a 2nd opinion, pt did verbalize desire to continue to proceed with chemotherapy.

## 2023-01-22 NOTE — Progress Notes (Signed)
The pt left me a VM this morning letting me know that she was not going to be able to make it to her radiation appt this morning because she is still in pain.  I called the pt at 8:56 to gather more information. Pt states that her back still hurts too much to lie flat on a table. I suggested to the pt that she consider going to an urgent care if her pain is still this bad. Pt hasn't taken any of the oxycodone for her pain as she doesn't like the side effects she experiences.  9:19 I spoke to technician on LIN[facility] 2 to let her know that the pt won't be coming in today for treatment because she is still in too much pain. Technician verbalized unerstanding and states she will have the support team reach out to the pt today. 10:10: I called the pt's mobile number, at which there wasn't an answer, then I called the home number. Pt answered the home number, but was on her mobile phone with Marchelle Folks, from the rad onc support team at the time. The pt states that Marchelle Folks was very helpful and is going to let Dr. Mitzi Hansen know about the pain she is experiencing.

## 2023-01-25 ENCOUNTER — Inpatient Hospital Stay: Payer: Medicare HMO

## 2023-01-25 ENCOUNTER — Ambulatory Visit: Admission: RE | Admit: 2023-01-25 | Payer: Medicare HMO | Source: Ambulatory Visit

## 2023-01-25 ENCOUNTER — Ambulatory Visit: Payer: Medicare HMO | Admitting: Radiation Oncology

## 2023-01-25 ENCOUNTER — Ambulatory Visit
Admission: RE | Admit: 2023-01-25 | Discharge: 2023-01-25 | Disposition: A | Discharge status: Home / Self Care | Payer: Medicare HMO | Source: Ambulatory Visit | Attending: Radiation Oncology

## 2023-01-25 ENCOUNTER — Other Ambulatory Visit: Payer: Self-pay

## 2023-01-25 VITALS — BP 156/59 | HR 87 | Temp 98.3°F | Resp 16 | Wt 170.0 lb

## 2023-01-25 DIAGNOSIS — Z79899 Other long term (current) drug therapy: Secondary | ICD-10-CM | POA: Diagnosis not present

## 2023-01-25 DIAGNOSIS — F17210 Nicotine dependence, cigarettes, uncomplicated: Secondary | ICD-10-CM | POA: Diagnosis not present

## 2023-01-25 DIAGNOSIS — C3412 Malignant neoplasm of upper lobe, left bronchus or lung: Secondary | ICD-10-CM

## 2023-01-25 DIAGNOSIS — C7931 Secondary malignant neoplasm of brain: Secondary | ICD-10-CM | POA: Diagnosis not present

## 2023-01-25 DIAGNOSIS — C3411 Malignant neoplasm of upper lobe, right bronchus or lung: Secondary | ICD-10-CM | POA: Diagnosis not present

## 2023-01-25 DIAGNOSIS — C180 Malignant neoplasm of cecum: Secondary | ICD-10-CM

## 2023-01-25 DIAGNOSIS — Z510 Encounter for antineoplastic radiation therapy: Secondary | ICD-10-CM | POA: Diagnosis not present

## 2023-01-25 DIAGNOSIS — Z5111 Encounter for antineoplastic chemotherapy: Secondary | ICD-10-CM | POA: Diagnosis not present

## 2023-01-25 LAB — CBC WITH DIFFERENTIAL (CANCER CENTER ONLY)
Abs Immature Granulocytes: 0.01 10*3/uL (ref 0.00–0.07)
Basophils Absolute: 0 10*3/uL (ref 0.0–0.1)
Basophils Relative: 0 %
Eosinophils Absolute: 0 10*3/uL (ref 0.0–0.5)
Eosinophils Relative: 1 %
HCT: 36.5 % (ref 36.0–46.0)
Hemoglobin: 12.8 g/dL (ref 12.0–15.0)
Immature Granulocytes: 0 %
Lymphocytes Relative: 30 %
Lymphs Abs: 1.8 10*3/uL (ref 0.7–4.0)
MCH: 30 pg (ref 26.0–34.0)
MCHC: 35.1 g/dL (ref 30.0–36.0)
MCV: 85.7 fL (ref 80.0–100.0)
Monocytes Absolute: 0.5 10*3/uL (ref 0.1–1.0)
Monocytes Relative: 9 %
Neutro Abs: 3.7 10*3/uL (ref 1.7–7.7)
Neutrophils Relative %: 60 %
Platelet Count: 412 10*3/uL — ABNORMAL HIGH (ref 150–400)
RBC: 4.26 MIL/uL (ref 3.87–5.11)
RDW: 14.1 % (ref 11.5–15.5)
WBC Count: 6.1 10*3/uL (ref 4.0–10.5)
nRBC: 0 % (ref 0.0–0.2)

## 2023-01-25 LAB — RAD ONC ARIA SESSION SUMMARY
Course Elapsed Days: 7
Plan Fractions Treated to Date: 2
Plan Prescribed Dose Per Fraction: 2 Gy
Plan Total Fractions Prescribed: 33
Plan Total Prescribed Dose: 66 Gy
Reference Point Dosage Given to Date: 4 Gy
Reference Point Session Dosage Given: 2 Gy
Session Number: 2

## 2023-01-25 LAB — CMP (CANCER CENTER ONLY)
ALT: 5 U/L (ref 0–44)
AST: 9 U/L — ABNORMAL LOW (ref 15–41)
Albumin: 4 g/dL (ref 3.5–5.0)
Alkaline Phosphatase: 85 U/L (ref 38–126)
Anion gap: 7 (ref 5–15)
BUN: 14 mg/dL (ref 8–23)
CO2: 25 mmol/L (ref 22–32)
Calcium: 9.8 mg/dL (ref 8.9–10.3)
Chloride: 105 mmol/L (ref 98–111)
Creatinine: 0.77 mg/dL (ref 0.44–1.00)
GFR, Estimated: >60 mL/min (ref >60)
Glucose, Bld: 109 mg/dL — ABNORMAL HIGH (ref 70–99)
Potassium: 4 mmol/L (ref 3.5–5.1)
Sodium: 137 mmol/L (ref 135–145)
Total Bilirubin: 0.6 mg/dL (ref 0.0–1.2)
Total Protein: 7.2 g/dL (ref 6.5–8.1)

## 2023-01-25 MED ORDER — PALONOSETRON HCL INJECTION 0.25 MG/5ML
0.2500 mg | Freq: Once | INTRAVENOUS | Status: AC
  Administered 2023-01-25: 0.25 mg via INTRAVENOUS
  Filled 2023-01-25: qty 5

## 2023-01-25 MED ORDER — SODIUM CHLORIDE 0.9 % IV SOLN
443.0000 mg | Freq: Once | INTRAVENOUS | Status: AC
  Administered 2023-01-25: 440 mg via INTRAVENOUS
  Filled 2023-01-25: qty 44

## 2023-01-25 MED ORDER — SODIUM CHLORIDE 0.9 % IV SOLN: INTRAVENOUS | Status: DC

## 2023-01-25 MED ORDER — SODIUM CHLORIDE 0.9 % IV SOLN
100.0000 mg/m2 | Freq: Once | INTRAVENOUS | Status: AC
  Administered 2023-01-25: 200 mg via INTRAVENOUS
  Filled 2023-01-25: qty 10

## 2023-01-25 MED ORDER — DEXAMETHASONE SODIUM PHOSPHATE 10 MG/ML IJ SOLN
10.0000 mg | Freq: Once | INTRAMUSCULAR | Status: AC
  Administered 2023-01-25: 10 mg via INTRAVENOUS
  Filled 2023-01-25: qty 1

## 2023-01-25 MED ORDER — SODIUM CHLORIDE 0.9 % IV SOLN
150.0000 mg | Freq: Once | INTRAVENOUS | Status: AC
  Administered 2023-01-25: 150 mg via INTRAVENOUS
  Filled 2023-01-25: qty 150

## 2023-01-25 NOTE — Patient Instructions (Signed)
[facility] - A DEPT OF [facility]  Discharge Instructions: Thank you for choosing [facility] to provide your oncology and hematology care.   If you have a lab appointment with the Cancer Center, please go directly to the Cancer Center and check in at the registration area.   Wear comfortable clothing and clothing appropriate for easy access to any Portacath or PICC line.   We strive to give you quality time with your provider. You may need to reschedule your appointment if you arrive late (15 or more minutes).  Arriving late affects you and other patients whose appointments are after yours.  Also, if you miss three or more appointments without notifying the office, you may be dismissed from the clinic at the provider's discretion.      For prescription refill requests, have your pharmacy contact our office and allow 72 hours for refills to be completed.    Today you received the following chemotherapy and/or immunotherapy agents Carboplatin, Etoposide      To help prevent nausea and vomiting after your treatment, we encourage you to take your nausea medication as directed.  BELOW ARE SYMPTOMS THAT SHOULD BE REPORTED IMMEDIATELY: *FEVER GREATER THAN 100.4 F (38 C) OR HIGHER *CHILLS OR SWEATING *NAUSEA AND VOMITING THAT IS NOT CONTROLLED WITH YOUR NAUSEA MEDICATION *UNUSUAL SHORTNESS OF BREATH *UNUSUAL BRUISING OR BLEEDING *URINARY PROBLEMS (pain or burning when urinating, or frequent urination) *BOWEL PROBLEMS (unusual diarrhea, constipation, pain near the anus) TENDERNESS IN MOUTH AND THROAT WITH OR WITHOUT PRESENCE OF ULCERS (sore throat, sores in mouth, or a toothache) UNUSUAL RASH, SWELLING OR PAIN  UNUSUAL VAGINAL DISCHARGE OR ITCHING   Items with * indicate a potential emergency and should be followed up as soon as possible or go to the Emergency Department if any problems should occur.  Please show the CHEMOTHERAPY ALERT CARD or  IMMUNOTHERAPY ALERT CARD at check-in to the Emergency Department and triage nurse.  Should you have questions after your visit or need to cancel or reschedule your appointment, please contact [facility] - A DEPT OF [facility]  Dept: [phone]  and follow the prompts.  Office hours are 8:00 a.m. to 4:30 p.m. Monday - Friday. Please note that voicemails left after 4:00 p.m. may not be returned until the following business day.  We are closed weekends and major holidays. You have access to a nurse at all times for urgent questions. Please call the main number to the clinic Dept: [phone] and follow the prompts.   For any non-urgent questions, you may also contact your provider using MyChart. We now offer e-Visits for anyone 57 and older to request care online for non-urgent symptoms. For details visit [email or web address].   Also download the MyChart app! Go to the app store, search "MyChart", open the app, select [place], and log in with your MyChart username and password.  Carboplatin Injection What is this medication? CARBOPLATIN (KAR boe pla tin) treats some types of cancer. It works by slowing down the growth of cancer cells. This medicine may be used for other purposes; ask your health care provider or pharmacist if you have questions. COMMON BRAND NAME(S): Paraplatin What should I tell my care team before I take this medication? They need to know if you have any of these conditions: Blood disorders Hearing problems Kidney disease Recent or ongoing radiation therapy An unusual or allergic reaction to carboplatin, cisplatin,  other medications, foods, dyes, or preservatives Pregnant or trying to get pregnant Breast-feeding How should I use this medication? This medication is injected into a vein. It is given by your care team in a hospital or clinic setting. Talk to your care team about the use of this medication in children. Special care may  be needed. Overdosage: If you think you have taken too much of this medicine contact a poison control center or emergency room at once. NOTE: This medicine is only for you. Do not share this medicine with others. What if I miss a dose? Keep appointments for follow-up doses. It is important not to miss your dose. Call your care team if you are unable to keep an appointment. What may interact with this medication? Medications for seizures Some antibiotics, such as amikacin, gentamicin, neomycin, streptomycin, tobramycin Vaccines This list may not describe all possible interactions. Give your health care provider a list of all the medicines, herbs, non-prescription drugs, or dietary supplements you use. Also tell them if you smoke, drink alcohol, or use illegal drugs. Some items may interact with your medicine. What should I watch for while using this medication? Your condition will be monitored carefully while you are receiving this medication. You may need blood work while taking this medication. This medication may make you feel generally unwell. This is not uncommon, as chemotherapy can affect healthy cells as well as cancer cells. Report any side effects. Continue your course of treatment even though you feel ill unless your care team tells you to stop. In some cases, you may be given additional medications to help with side effects. Follow all directions for their use. This medication may increase your risk of getting an infection. Call your care team for advice if you get a fever, chills, sore throat, or other symptoms of a cold or flu. Do not treat yourself. Try to avoid being around people who are sick. Avoid taking medications that contain aspirin, acetaminophen, ibuprofen, naproxen, or ketoprofen unless instructed by your care team. These medications may hide a fever. Be careful brushing or flossing your teeth or using a toothpick because you may get an infection or bleed more easily. If you  have any dental work done, tell your dentist you are receiving this medication. Talk to your care team if you wish to become pregnant or think you might be pregnant. This medication can cause serious birth defects. Talk to your care team about effective forms of contraception. Do not breast-feed while taking this medication. What side effects may I notice from receiving this medication? Side effects that you should report to your care team as soon as possible: Allergic reactions--skin rash, itching, hives, swelling of the face, lips, tongue, or throat Infection--fever, chills, cough, sore throat, wounds that don't heal, pain or trouble when passing urine, general feeling of discomfort or being unwell Low red blood cell level--unusual weakness or fatigue, dizziness, headache, trouble breathing Pain, tingling, or numbness in the hands or feet, muscle weakness, change in vision, confusion or trouble speaking, loss of balance or coordination, trouble walking, seizures Unusual bruising or bleeding Side effects that usually do not require medical attention (report to your care team if they continue or are bothersome): Hair loss Nausea Unusual weakness or fatigue Vomiting This list may not describe all possible side effects. Call your doctor for medical advice about side effects. You may report side effects to FDA at 1-800-FDA-1088. Where should I keep my medication? This medication is given in a hospital  or clinic. It will not be stored at home. NOTE: This sheet is a summary. It may not cover all possible information. If you have questions about this medicine, talk to your doctor, pharmacist, or health care provider.  2024 Elsevier/Gold Standard (2021-05-06 00:00:00) Etoposide Injection What is this medication? ETOPOSIDE (e toe POE side) treats some types of cancer. It works by slowing down the growth of cancer cells. This medicine may be used for other purposes; ask your health care provider or  pharmacist if you have questions. COMMON BRAND NAME(S): Etopophos, Toposar, VePesid What should I tell my care team before I take this medication? They need to know if you have any of these conditions: Infection Kidney disease Liver disease Low blood counts, such as low white cell, platelet, red cell counts An unusual or allergic reaction to etoposide, other medications, foods, dyes, or preservatives If you or your partner are pregnant or trying to get pregnant Breastfeeding How should I use this medication? This medication is injected into a vein. It is given by your care team in a hospital or clinic setting. Talk to your care team about the use of this medication in children. Special care may be needed. Overdosage: If you think you have taken too much of this medicine contact a poison control center or emergency room at once. NOTE: This medicine is only for you. Do not share this medicine with others. What if I miss a dose? Keep appointments for follow-up doses. It is important not to miss your dose. Call your care team if you are unable to keep an appointment. What may interact with this medication? Warfarin This list may not describe all possible interactions. Give your health care provider a list of all the medicines, herbs, non-prescription drugs, or dietary supplements you use. Also tell them if you smoke, drink alcohol, or use illegal drugs. Some items may interact with your medicine. What should I watch for while using this medication? Your condition will be monitored carefully while you are receiving this medication. This medication may make you feel generally unwell. This is not uncommon as chemotherapy can affect healthy cells as well as cancer cells. Report any side effects. Continue your course of treatment even though you feel ill unless your care team tells you to stop. This medication can cause serious side effects. To reduce the risk, your care team may give you other  medications to take before receiving this one. Be sure to follow the directions from your care team. This medication may increase your risk of getting an infection. Call your care team for advice if you get a fever, chills, sore throat, or other symptoms of a cold or flu. Do not treat yourself. Try to avoid being around people who are sick. This medication may increase your risk to bruise or bleed. Call your care team if you notice any unusual bleeding. Talk to your care team about your risk of cancer. You may be more at risk for certain types of cancers if you take this medication. Talk to your care team if you may be pregnant. Serious birth defects can occur if you take this medication during pregnancy and for 6 months after the last dose. You will need a negative pregnancy test before starting this medication. Contraception is recommended while taking this medication and for 6 months after the last dose. Your care team can help you find the option that works for you. If your partner can get pregnant, use a condom during sex while  taking this medication and for 4 months after the last dose. Do not breastfeed while taking this medication. This medication may cause infertility. Talk to your care team if you are concerned about your fertility. What side effects may I notice from receiving this medication? Side effects that you should report to your care team as soon as possible: Allergic reactions--skin rash, itching, hives, swelling of the face, lips, tongue, or throat Infection--fever, chills, cough, sore throat, wounds that don't heal, pain or trouble when passing urine, general feeling of discomfort or being unwell Low red blood cell level--unusual weakness or fatigue, dizziness, headache, trouble breathing Unusual bruising or bleeding Side effects that usually do not require medical attention (report to your care team if they continue or are bothersome): Diarrhea Fatigue Hair loss Loss of  appetite Nausea Vomiting This list may not describe all possible side effects. Call your doctor for medical advice about side effects. You may report side effects to FDA at 1-800-FDA-1088. Where should I keep my medication? This medication is given in a hospital or clinic. It will not be stored at home. NOTE: This sheet is a summary. It may not cover all possible information. If you have questions about this medicine, talk to your doctor, pharmacist, or health care provider.  2024 Elsevier/Gold Standard (2021-06-05 00:00:00)

## 2023-01-25 NOTE — Progress Notes (Signed)
2:00 I placed an order for the pt to have a port placement per her request. Order sent to Dr Arbutus Ped for [facility].

## 2023-01-25 NOTE — Progress Notes (Signed)
9:08 pt called me back and said that Dr Dalphine Handing office called her and an appt for 1/9 at 11:30 was made. Pt states she will need to reschedule her radiation therapy appt that day, which is currently scheduled for 10:30. I let the pt know that she can let the radiation team know today at her treatment that her appt on 1/9 will need to be changed. Pt verbalized understanding.

## 2023-01-25 NOTE — Progress Notes (Signed)
I called the pt this morning after receiving a VM from 12/29 that the pt would like me to make her an appt with Dr Jordan Likes, her neurosurgeon, to address the brace she has to wear. She also mentioned new numbness in both thighs, R greater than left and down her legs, as well as worsening tingling in the tops of her feet. Pt has preexisting numbness in her feet from a previous injury, but it has increased since she was repositioned incorrectly after radiation therapy. I encouraged the pt to go to an ER for her new neurological symptoms so she can be evaluated, but pt declined. Pt states she is getting her chemo and radiation today "come hell or high water". I verbalized understanding. At the time of our call, Dr Dalphine Handing office was not yet open, and I told her I would call them after 9am.

## 2023-01-25 NOTE — Progress Notes (Signed)
At approximately 12:00, I met with pt in radiation oncology. I let her know I had spoken to Dr Arbutus Ped about her desire for a 2nd opinion referral and the possibility of a clinical trial. I let her know that Dr Arbutus Ped is happen to make a referral to [facility], however if there is a clinical trial, her getting chemotherapy may make her ineligible for accepted into a clinical trial. Pt verbalized understanding. She states "I just want a second opinion. 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

## 2023-01-26 ENCOUNTER — Other Ambulatory Visit: Payer: Self-pay

## 2023-01-26 ENCOUNTER — Inpatient Hospital Stay: Payer: Medicare HMO

## 2023-01-26 ENCOUNTER — Ambulatory Visit
Admission: RE | Admit: 2023-01-26 | Discharge: 2023-01-26 | Discharge status: Home / Self Care | Payer: Medicare HMO | Source: Ambulatory Visit | Attending: Radiation Oncology

## 2023-01-26 ENCOUNTER — Ambulatory Visit: Payer: Medicare HMO | Admitting: Radiation Oncology

## 2023-01-26 VITALS — BP 160/49 | HR 76 | Temp 98.2°F | Resp 16

## 2023-01-26 DIAGNOSIS — Z5111 Encounter for antineoplastic chemotherapy: Secondary | ICD-10-CM | POA: Diagnosis not present

## 2023-01-26 DIAGNOSIS — C3412 Malignant neoplasm of upper lobe, left bronchus or lung: Secondary | ICD-10-CM | POA: Diagnosis not present

## 2023-01-26 DIAGNOSIS — Z79899 Other long term (current) drug therapy: Secondary | ICD-10-CM | POA: Diagnosis not present

## 2023-01-26 DIAGNOSIS — C7931 Secondary malignant neoplasm of brain: Secondary | ICD-10-CM | POA: Diagnosis not present

## 2023-01-26 DIAGNOSIS — F17210 Nicotine dependence, cigarettes, uncomplicated: Secondary | ICD-10-CM | POA: Diagnosis not present

## 2023-01-26 DIAGNOSIS — Z510 Encounter for antineoplastic radiation therapy: Secondary | ICD-10-CM | POA: Diagnosis not present

## 2023-01-26 DIAGNOSIS — C3411 Malignant neoplasm of upper lobe, right bronchus or lung: Secondary | ICD-10-CM | POA: Diagnosis not present

## 2023-01-26 LAB — RAD ONC ARIA SESSION SUMMARY
Course Elapsed Days: 8
Plan Fractions Treated to Date: 3
Plan Prescribed Dose Per Fraction: 2 Gy
Plan Total Fractions Prescribed: 33
Plan Total Prescribed Dose: 66 Gy
Reference Point Dosage Given to Date: 6 Gy
Reference Point Session Dosage Given: 2 Gy
Session Number: 3

## 2023-01-26 MED ORDER — DEXAMETHASONE SODIUM PHOSPHATE 10 MG/ML IJ SOLN
10.0000 mg | Freq: Once | INTRAMUSCULAR | Status: AC
  Administered 2023-01-26: 10 mg via INTRAVENOUS
  Filled 2023-01-26: qty 1

## 2023-01-26 MED ORDER — SODIUM CHLORIDE 0.9 % IV SOLN: INTRAVENOUS | Status: DC

## 2023-01-26 MED ORDER — SODIUM CHLORIDE 0.9 % IV SOLN
100.0000 mg/m2 | Freq: Once | INTRAVENOUS | Status: AC
  Administered 2023-01-26: 200 mg via INTRAVENOUS
  Filled 2023-01-26: qty 10

## 2023-01-28 ENCOUNTER — Other Ambulatory Visit: Payer: Self-pay

## 2023-01-28 ENCOUNTER — Inpatient Hospital Stay: Payer: Medicare HMO

## 2023-01-28 ENCOUNTER — Ambulatory Visit (HOSPITAL_COMMUNITY): Payer: Medicare HMO

## 2023-01-28 ENCOUNTER — Ambulatory Visit
Admission: RE | Admit: 2023-01-28 | Discharge: 2023-01-28 | Disposition: A | Discharge status: Home / Self Care | Payer: Medicare HMO | Source: Ambulatory Visit | Attending: Radiation Oncology | Admitting: Radiation Oncology

## 2023-01-28 VITALS — BP 139/55 | HR 87 | Temp 98.0°F | Resp 16

## 2023-01-28 DIAGNOSIS — Z1211 Encounter for screening for malignant neoplasm of colon: Secondary | ICD-10-CM | POA: Insufficient documentation

## 2023-01-28 DIAGNOSIS — F17210 Nicotine dependence, cigarettes, uncomplicated: Secondary | ICD-10-CM | POA: Insufficient documentation

## 2023-01-28 DIAGNOSIS — C3412 Malignant neoplasm of upper lobe, left bronchus or lung: Secondary | ICD-10-CM | POA: Insufficient documentation

## 2023-01-28 DIAGNOSIS — C7931 Secondary malignant neoplasm of brain: Secondary | ICD-10-CM | POA: Diagnosis not present

## 2023-01-28 DIAGNOSIS — Z5111 Encounter for antineoplastic chemotherapy: Secondary | ICD-10-CM | POA: Insufficient documentation

## 2023-01-28 DIAGNOSIS — Z79899 Other long term (current) drug therapy: Secondary | ICD-10-CM | POA: Insufficient documentation

## 2023-01-28 DIAGNOSIS — Z510 Encounter for antineoplastic radiation therapy: Secondary | ICD-10-CM | POA: Diagnosis not present

## 2023-01-28 LAB — RAD ONC ARIA SESSION SUMMARY
Course Elapsed Days: 10
Plan Fractions Treated to Date: 4
Plan Prescribed Dose Per Fraction: 2 Gy
Plan Total Fractions Prescribed: 33
Plan Total Prescribed Dose: 66 Gy
Reference Point Dosage Given to Date: 8 Gy
Reference Point Session Dosage Given: 2 Gy
Session Number: 4

## 2023-01-28 MED ORDER — SODIUM CHLORIDE 0.9 % IV SOLN: INTRAVENOUS | Status: DC

## 2023-01-28 MED ORDER — DEXAMETHASONE SODIUM PHOSPHATE 10 MG/ML IJ SOLN
10.0000 mg | Freq: Once | INTRAMUSCULAR | Status: AC
  Administered 2023-01-28: 10 mg via INTRAVENOUS
  Filled 2023-01-28: qty 1

## 2023-01-28 MED ORDER — ETOPOSIDE CHEMO INJECTION 1 GM/50ML
100.0000 mg/m2 | Freq: Once | INTRAVENOUS | Status: AC
  Administered 2023-01-28: 200 mg via INTRAVENOUS
  Filled 2023-01-28: qty 10

## 2023-01-28 NOTE — Patient Instructions (Signed)
 [facility] - A DEPT OF [facility]  Discharge Instructions: Thank you for choosing [facility] to provide your oncology and hematology care.   If you have a lab appointment with the Cancer Center, please go directly to the Cancer Center and check in at the registration area.   Wear comfortable clothing and clothing appropriate for easy access to any Portacath or PICC line.   We strive to give you quality time with your provider. You may need to reschedule your appointment if you arrive late (15 or more minutes).  Arriving late affects you and other patients whose appointments are after yours.  Also, if you miss three or more appointments without notifying the office, you may be dismissed from the clinic at the provider's discretion.      For prescription refill requests, have your pharmacy contact our office and allow 72 hours for refills to be completed.    Today you received the following chemotherapy and/or immunotherapy agents: Etoposide      To help prevent nausea and vomiting after your treatment, we encourage you to take your nausea medication as directed.  BELOW ARE SYMPTOMS THAT SHOULD BE REPORTED IMMEDIATELY: *FEVER GREATER THAN 100.4 F (38 C) OR HIGHER *CHILLS OR SWEATING *NAUSEA AND VOMITING THAT IS NOT CONTROLLED WITH YOUR NAUSEA MEDICATION *UNUSUAL SHORTNESS OF BREATH *UNUSUAL BRUISING OR BLEEDING *URINARY PROBLEMS (pain or burning when urinating, or frequent urination) *BOWEL PROBLEMS (unusual diarrhea, constipation, pain near the anus) TENDERNESS IN MOUTH AND THROAT WITH OR WITHOUT PRESENCE OF ULCERS (sore throat, sores in mouth, or a toothache) UNUSUAL RASH, SWELLING OR PAIN  UNUSUAL VAGINAL DISCHARGE OR ITCHING   Items with * indicate a potential emergency and should be followed up as soon as possible or go to the Emergency Department if any problems should occur.  Please show the CHEMOTHERAPY ALERT CARD or IMMUNOTHERAPY  ALERT CARD at check-in to the Emergency Department and triage nurse.  Should you have questions after your visit or need to cancel or reschedule your appointment, please contact [facility] - A DEPT OF [facility]  Dept: [phone]  and follow the prompts.  Office hours are 8:00 a.m. to 4:30 p.m. Monday - Friday. Please note that voicemails left after 4:00 p.m. may not be returned until the following business day.  We are closed weekends and major holidays. You have access to a nurse at all times for urgent questions. Please call the main number to the clinic Dept: [phone] and follow the prompts.   For any non-urgent questions, you may also contact your provider using MyChart. We now offer e-Visits for anyone 35 and older to request care online for non-urgent symptoms. For details visit [email or web address].   Also download the MyChart app! Go to the app store, search MyChart, open the app, select [place], and log in with your MyChart username and password.

## 2023-01-29 ENCOUNTER — Ambulatory Visit
Admission: RE | Admit: 2023-01-29 | Discharge: 2023-01-29 | Disposition: A | Discharge status: Home / Self Care | Payer: Medicare HMO | Source: Ambulatory Visit | Attending: Radiation Oncology

## 2023-01-29 ENCOUNTER — Ambulatory Visit: Payer: Medicare HMO

## 2023-01-29 ENCOUNTER — Ambulatory Visit: Payer: Medicare HMO | Admitting: Radiation Oncology

## 2023-01-29 ENCOUNTER — Other Ambulatory Visit: Payer: Self-pay | Admitting: Emergency Medicine

## 2023-01-29 ENCOUNTER — Other Ambulatory Visit: Payer: Self-pay

## 2023-01-29 DIAGNOSIS — C3412 Malignant neoplasm of upper lobe, left bronchus or lung: Secondary | ICD-10-CM | POA: Diagnosis not present

## 2023-01-29 DIAGNOSIS — Z1211 Encounter for screening for malignant neoplasm of colon: Secondary | ICD-10-CM | POA: Diagnosis not present

## 2023-01-29 DIAGNOSIS — F4323 Adjustment disorder with mixed anxiety and depressed mood: Secondary | ICD-10-CM

## 2023-01-29 DIAGNOSIS — C7931 Secondary malignant neoplasm of brain: Secondary | ICD-10-CM | POA: Diagnosis not present

## 2023-01-29 DIAGNOSIS — Z510 Encounter for antineoplastic radiation therapy: Secondary | ICD-10-CM | POA: Diagnosis not present

## 2023-01-29 DIAGNOSIS — F418 Other specified anxiety disorders: Secondary | ICD-10-CM

## 2023-01-29 LAB — RAD ONC ARIA SESSION SUMMARY
Course Elapsed Days: 11
Plan Fractions Treated to Date: 5
Plan Prescribed Dose Per Fraction: 2 Gy
Plan Total Fractions Prescribed: 33
Plan Total Prescribed Dose: 66 Gy
Reference Point Dosage Given to Date: 10 Gy
Reference Point Session Dosage Given: 2 Gy
Session Number: 5

## 2023-01-29 MED ORDER — SONAFINE EX EMUL
1.0000 | Freq: Two times a day (BID) | CUTANEOUS | Status: DC
  Administered 2023-01-29: 1 via TOPICAL

## 2023-01-29 NOTE — Progress Notes (Signed)
 I spoke with the pt today who inquired about the status of her second opinion referral at [facility]. I called [facility] and spoke to Jasmine Long. Jasmine Long states that she did not see the referral paperwork and confirmed the fax number for me [phone]. Paperwork faxed again to [facility] at 9:23, fax  receipt successful per fax machine sent log.  Pt also expressed the desire to move her brain MRI appt until after she sees Dr Jasmine Long on 1/9. I called Jasmine Long at 2:05pm to inform her of pts request and ask that she call the pt to confirm.  At 2:09pm I reached out to [facility] again. Receipt of the pts referral paperwork was confirmed.  I called pt back at 2:57pm to let her know [facility] has her referral paperwork and they will call within a week or so to make her consult appt. Pt verbalized understanding.

## 2023-02-01 ENCOUNTER — Ambulatory Visit: Payer: Medicare HMO

## 2023-02-01 ENCOUNTER — Ambulatory Visit: Payer: Medicare HMO | Admitting: Radiation Oncology

## 2023-02-01 NOTE — Progress Notes (Signed)
 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   PRIOR THERAPY: None  CURRENT THERAPY: 1) of systemic chemotherapy with carboplatin  for an AUC 5 and etoposide  100 mg/m on days 1, 2, and 3 IV every 3 weeks.  Imfinzi  will be added once she completes palliative radiation. 2) Palliative radiation to the lung under the care of Dr. Patrcia.  3) scheduled for SRS to the brain on 02/18/23  INTERVAL HISTORY: Jasmine Long 73 y.o. female returns to the clinic today for a follow-up visit.  The patient was recently diagnosed with extensive stage small cell lung cancer.  She is currently undergoing palliative radiation to the lung. She does have metastatic disease to the brain and she is expected to go [facility] on 02/18/2023 under the care of Dr. Patrcia. She is expected to have her brain MRI SRS protocol on Saturday but she is worried she won't be able to make it with the possible winter weather expected.  The patient is interested in a second opinion and she is expected to see [facility] in the future but she has not received an appointment yet.   The patient is expected to have a Port-A-Cath on 02/08/2023.  She does not have a prescription for Emla  cream.   She underwent her first cycle of chemotherapy on 01/25/2023.  She states that she tolerated this great without any adverse side effects of treatment. Dr. Sherrod is planning on adding immunotherapy once she completes palliative radiation to the  lung.   Overall she is feeling fairly well today except she is expected to see her family doctor later this afternoon for possible sinus infection and UTI. The patient states that she is prone to recurrent sinus infections and urinary tract infections.  She purchased cranberry juice.  She also states she has a history of diverticulitis and mentions she is having some suprapubic discomfort and is wondering if that is related to that. She denies changes in her bowel habits except for mild constipation. Denies blood in the stool. Denies tenderness to palpation. Denies fevers, chills, or appetite change. She reports she is eating pretty good.   He denies any breathing changes since she was last seen.  She denies any significant dyspnea on exertion.  She reports some cough secondary to postnasal drainage.  Denies any hemoptysis.  She uses Salonpas patches for back pain.  She does not like pain medication because it affects her mentation.  She denies any nausea or vomiting from treatment.  Denies any headache or visual changes.  She is here today for a 1 week follow-up visit and to manage any adverse side effects of treatment.   MEDICAL HISTORY: Past Medical History:  Diagnosis Date   Allergy    Anxiety    Depression    Diabetes mellitus without complication (HCC)    Heart murmur    Hypertension    Thyroid  disease    Ulcer     ALLERGIES:  has no known allergies.  MEDICATIONS:  Current Outpatient Medications  Medication  Sig Dispense Refill   ALPRAZolam  (XANAX ) 0.5 MG tablet TAKE 1 TABLET BY MOUTH ONCE DAILY AS NEEDED FOR ANXIETY 10 tablet 0   atorvastatin  (LIPITOR) 20 MG tablet Take 1 tablet by mouth once daily 90 tablet 1   buPROPion  (WELLBUTRIN  XL) 300 MG 24 hr tablet Take 1 tablet by mouth once daily 90 tablet 1   busPIRone  (BUSPAR ) 7.5 MG tablet Take 1 tablet (7.5 mg total) by mouth 2 (two) times daily. 60 tablet 1   gabapentin (NEURONTIN) 300 MG capsule Take 300 mg by mouth as needed  (numbness).     levothyroxine  (SYNTHROID ) 125 MCG tablet TAKE 1 TABLET BY MOUTH ONCE DAILY BEFORE BREAKFAST 90 tablet 0   lidocaine  (LIDODERM ) 5 % Place 1 patch onto the skin daily. Remove & Discard patch within 12 hours or as directed by MD 30 patch 0   lidocaine -prilocaine  (EMLA ) cream Apply 1 Application topically as needed. 30 g 2   metFORMIN  (GLUCOPHAGE ) 1000 MG tablet TAKE 1 TABLET BY MOUTH TWICE DAILY WITH A MEAL 180 tablet 1   metoprolol  succinate (TOPROL -XL) 25 MG 24 hr tablet Take 1 tablet by mouth once daily 90 tablet 3   nicotine  (NICODERM CQ ) 21 mg/24hr patch Place 1 patch (21 mg total) onto the skin daily. 28 patch 3   ondansetron  (ZOFRAN ) 8 MG tablet Take 1 tablet (8 mg total) by mouth every 8 (eight) hours as needed for nausea or vomiting. 30 tablet 1   ondansetron  (ZOFRAN ) 8 MG tablet Take 1 tablet (8 mg total) by mouth every 8 (eight) hours as needed for nausea or vomiting. Start on third day after chemotherapy. 30 tablet 1   pantoprazole  (PROTONIX ) 40 MG tablet Take 1 tablet twice a day for 2 weeks and then daily after that 90 tablet 3   prochlorperazine  (COMPAZINE ) 10 MG tablet Take 1 tablet (10 mg total) by mouth every 6 (six) hours as needed for nausea or vomiting (Nausea or vomiting). 30 tablet 1   valsartan -hydrochlorothiazide  (DIOVAN -HCT) 160-12.5 MG tablet Take 1 tablet by mouth once daily 90 tablet 3   escitalopram  (LEXAPRO ) 10 MG tablet Take 1 tablet (10 mg total) by mouth daily. 90 tablet 3   No current facility-administered medications for this visit.    SURGICAL HISTORY:  Past Surgical History:  Procedure Laterality Date   BREAST SURGERY     CESAREAN SECTION     CHOLECYSTECTOMY     COLONOSCOPY WITH PROPOFOL  N/A 10/30/2022   Procedure: COLONOSCOPY WITH PROPOFOL ;  Surgeon: Rollin Dover, MD;  Location: [facility];  Service: Gastroenterology;  Laterality: N/A;   ENDOSCOPIC MUCOSAL RESECTION  10/30/2022   Procedure: ENDOSCOPIC MUCOSAL RESECTION;  Surgeon: Rollin Dover, MD;  Location: [facility];  Service: Gastroenterology;;   HEMOSTASIS CLIP PLACEMENT  10/30/2022   Procedure: HEMOSTASIS CLIP PLACEMENT;  Surgeon: Rollin Dover, MD;  Location: [facility];  Service: Gastroenterology;;   NASAL SEPTUM SURGERY     POLYPECTOMY  10/30/2022   Procedure: POLYPECTOMY;  Surgeon: Rollin Dover, MD;  Location: [facility];  Service: Gastroenterology;;   ROBLEY LIFTING INJECTION  10/30/2022   Procedure: SUBMUCOSAL LIFTING INJECTION;  Surgeon: Rollin Dover, MD;  Location: [facility];  Service: Gastroenterology;;    REVIEW OF SYSTEMS:   Review of Systems  Constitutional: Positive for stable fatigue. Negative for appetite change, chills, fatigue, fever and unexpected weight change.  HENT: Positive for nasal congestion. Negative for mouth sores, nosebleeds, sore throat and trouble swallowing.   Eyes: Negative for eye problems and icterus.  Respiratory: Positive for cough from sinus drainage. Negative for hemoptysis, shortness of breath and wheezing.   Cardiovascular: Negative for chest pain and leg swelling.  Gastrointestinal: Positive for mild constipation. Negative for diarrhea, nausea and vomiting.  Genitourinary: Positive for mild dysuria. Negative for bladder incontinence, difficulty urinating,  frequency and hematuria.   Musculoskeletal: Stable back pain. Negative for gait problem, neck pain and neck stiffness.  Skin: Negative for itching and rash.  Neurological: Negative for dizziness, extremity weakness, gait problem, headaches, light-headedness and seizures.  Hematological: Negative for adenopathy. Does not bruise/bleed easily.  Psychiatric/Behavioral: Negative for confusion, depression and sleep disturbance. The patient is not nervous/anxious.     PHYSICAL EXAMINATION:  Blood pressure (!) 145/45, pulse 92, temperature 97.6 F (36.4 C), temperature source Temporal, resp. rate 16, weight 165 lb 3.2 oz (74.9 kg), SpO2 100%.  ECOG PERFORMANCE  STATUS: 2  Physical Exam  Constitutional: Oriented to person, place, and time and chronically ill appearing female, and in no distress.  HENT:  Head: Normocephalic and atraumatic.  Mouth/Throat: Oropharynx is clear and moist. No oropharyngeal exudate.  Eyes: Conjunctivae are normal. Right eye exhibits no discharge. Left eye exhibits no discharge. No scleral icterus.  Neck: Normal range of motion. Neck supple.  Cardiovascular: Normal rate, regular rhythm, murmur noted and intact distal pulses.   Pulmonary/Chest: Effort normal and breath sounds normal. No respiratory distress. No wheezes. No rales.  Abdominal: Soft. Bowel sounds are normal. Exhibits no distension and no mass. There is no tenderness.  Musculoskeletal: Examined in chair. Exhibits no edema.  Lymphadenopathy:    No cervical adenopathy.  Neurological: Alert and oriented to person, place, and time. Exhibits muscle wasting. Uses a cane for ambulation. Gait normal. Coordination normal.  Skin: Skin is warm and dry. No rash noted. Not diaphoretic. No erythema. No pallor.  Psychiatric: Mood, memory and judgment normal.  Vitals reviewed.  LABORATORY DATA: Lab Results  Component Value Date   WBC 2.4 (L) 02/04/2023   HGB 11.8 (L) 02/04/2023   HCT 35.6 (L) 02/04/2023   MCV 88.3 02/04/2023   PLT 178 02/04/2023      Chemistry      Component Value Date/Time   NA 137 02/04/2023 0844   NA 134 09/11/2022 1259   K 4.3 02/04/2023 0844   CL 105 02/04/2023 0844   CO2 25 02/04/2023 0844   BUN 18 02/04/2023 0844   BUN 12 09/11/2022 1259   CREATININE 0.84 02/04/2023 0844   CREATININE 0.98 10/30/2013 1118      Component Value Date/Time   CALCIUM  9.6 02/04/2023 0844   ALKPHOS 95 02/04/2023 0844   AST 8 (L) 02/04/2023 0844   ALT <5 02/04/2023 0844   BILITOT 0.5 02/04/2023 0844       RADIOGRAPHIC STUDIES:  CT Lumbar Spine Wo Contrast Result Date: 01/12/2023 CLINICAL DATA:  Fall EXAM: CT THORACIC AND LUMBAR SPINE WITHOUT  CONTRAST TECHNIQUE: Multidetector CT imaging of the thoracic and lumbar spine was performed without contrast. Multiplanar CT image reconstructions were also generated. RADIATION DOSE REDUCTION: This exam was performed according to the departmental dose-optimization program which includes automated exposure control, adjustment of the mA and/or kV according to patient size and/or use of iterative reconstruction technique. COMPARISON:  Chest CT 11/27/2022 FINDINGS: CT THORACIC SPINE FINDINGS Alignment: Normal Vertebrae: T12 wedge compression fracture with approximately 25% anterior height loss. No retropulsion. No other thoracic spine fracture. Bulky anterior osteophytes at the cervicothoracic junction. Paraspinal and other soft tissues: Left upper lobe subpleural mass incompletely visualized.  Disc levels: No spinal canal stenosis. CT LUMBAR SPINE FINDINGS Segmentation: 5 lumbar type vertebrae. Alignment: Normal. Vertebrae: No acute fracture or focal pathologic process. Paraspinal and other soft tissues: Calcific aortic atherosclerosis. Sigmoid diverticulosis. Disc levels: No spinal canal stenosis. IMPRESSION: 1. T12 wedge compression fracture with approximately 25% anterior height loss. No retropulsion. 2. No acute fracture or static subluxation of the lumbar spine. 3. Left upper lobe subpleural mass incompletely visualized. Aortic Atherosclerosis (ICD10-I70.0). Electronically Signed   By: Franky Stanford M.D.   On: 01/12/2023 21:43   CT Thoracic Spine Wo Contrast Result Date: 01/12/2023 CLINICAL DATA:  Fall EXAM: CT THORACIC AND LUMBAR SPINE WITHOUT CONTRAST TECHNIQUE: Multidetector CT imaging of the thoracic and lumbar spine was performed without contrast. Multiplanar CT image reconstructions were also generated. RADIATION DOSE REDUCTION: This exam was performed according to the departmental dose-optimization program which includes automated exposure control, adjustment of the mA and/or kV according to patient  size and/or use of iterative reconstruction technique. COMPARISON:  Chest CT 11/27/2022 FINDINGS: CT THORACIC SPINE FINDINGS Alignment: Normal Vertebrae: T12 wedge compression fracture with approximately 25% anterior height loss. No retropulsion. No other thoracic spine fracture. Bulky anterior osteophytes at the cervicothoracic junction. Paraspinal and other soft tissues: Left upper lobe subpleural mass incompletely visualized. Disc levels: No spinal canal stenosis. CT LUMBAR SPINE FINDINGS Segmentation: 5 lumbar type vertebrae. Alignment: Normal. Vertebrae: No acute fracture or focal pathologic process. Paraspinal and other soft tissues: Calcific aortic atherosclerosis. Sigmoid diverticulosis. Disc levels: No spinal canal stenosis. IMPRESSION: 1. T12 wedge compression fracture with approximately 25% anterior height loss. No retropulsion. 2. No acute fracture or static subluxation of the lumbar spine. 3. Left upper lobe subpleural mass incompletely visualized. Aortic Atherosclerosis (ICD10-I70.0). Electronically Signed   By: Franky Stanford M.D.   On: 01/12/2023 21:43   NM PET Image Initial (PI) Skull Base To Thigh Result Date: 01/11/2023 CLINICAL DATA:  Subsequent treatment strategy for LEFT upper lobe subpleural mass concerning for bronchogenic carcinoma. Additional history of recently diagnosed colon cancer. EXAM: NUCLEAR MEDICINE PET SKULL BASE TO THIGH TECHNIQUE: 8.5 mCi F-18 FDG was injected intravenously. Full-ring PET imaging was performed from the skull base to thigh after the radiotracer. CT data was obtained and used for attenuation correction and anatomic localization. Fasting blood glucose: 94 mg/dl COMPARISON:  None Available. FINDINGS: Mediastinal blood pool activity: SUV max 1.9 Liver activity: SUV max NA NECK: LEFT supraclavicular node measures 2.1 cm short axis (image 51) with SUV max 11.8. Incidental CT findings: None. CHEST: Intensely hypermetabolic rounded soft tissue mass along the pleural  surface of the LEFT upper lobe measures 4.9 cm with SUV max 60.6 (image 67). Questionable tumor involvement of the adjacent second rib. Hypermetabolic LEFT upper lobe perihilar nodule is measures 12 mm on image 74. Hypermetabolic AP window / LEFT lower paratracheal node measures 18 mm with SUV max equal 13.1. Incidental CT findings: None. ABDOMEN/PELVIS: Clear abnormal metabolic activity associated with the colon. No evidence of liver metastasis. Adrenal glands are normal. No hypermetabolic abdominopelvic lymph nodes Incidental CT findings: Postcholecystectomy. SKELETON: No focal hypermetabolic activity to suggest skeletal metastasis. Incidental CT findings: None. IMPRESSION: 1. Intensely hypermetabolic LEFT upper lobe pleural mass consistent with bronchogenic carcinoma. 2. Large hypermetabolic LEFT supraclavicular and small LEFT mediastinal lymph node consistent with nodal metastasis. 3. Hypermetabolic LEFT upper lobe perihilar nodule is concerning for pulmonary metastasis. 4. No evidence of colon cancer metastasis. Electronically Signed   By: Jackquline Boxer M.D.   On: 01/11/2023 10:43   MR  Brain W Wo Contrast Result Date: 01/07/2023 CLINICAL DATA:  Small cell lung cancer (SCLC), staging EXAM: MRI HEAD WITHOUT AND WITH CONTRAST TECHNIQUE: Multiplanar, multiecho pulse sequences of the brain and surrounding structures were obtained without and with intravenous contrast. CONTRAST:  7.5mL GADAVIST  GADOBUTROL  1 MMOL/ML IV SOLN COMPARISON:  None Available. FINDINGS: Brain: No acute infarction, hemorrhage, hydrocephalus, or extra-axial collection. Approximally 4 mm enhancing right periatrial lesion (series 14, image 77). Mild surrounding edema on FLAIR. No significant mass effect. Vascular: Major arterial flow voids are maintained at the skull base. Skull and upper cervical spine: Normal marrow signal. Sinuses/Orbits: Clear sinuses.  No acute orbital findings. Other: No mastoid effusions. IMPRESSION: Approximally 4  mm enhancing right periatrial lesion, concerning for a metastasis given the clinical history. Electronically Signed   By: Gilmore GORMAN Molt M.D.   On: 01/07/2023 14:12     ASSESSMENT/PLAN:  This is a very pleasant 73 year old Caucasian female with extensive stage (T2b, N3, M1 B) small cell lung cancer.  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  She was diagnosed in December 2024 -The patient also has early stage IV colon adenocarcinoma that was diagnosed on colonoscopy but surgical resection is currently on hold until management of her small cell lung cancer.  She is currently undergoing palliative radiation to the lung and expected to undergo SRS to the metastatic brain lesion on 02/18/2023 under the care of Dr. Patrcia.  She is currently undergoing palliative systemic chemotherapy with carboplatin  for an AUC of 5 on day 1, etoposide  100 mg/m on days 1, 2, and 3 IV every 3 weeks.  Dr. Sherrod will add Imfinzi  1500 mg on day 1 once she completes her palliative radiation.  Labs were reviewed.  Her white blood cell count is 2.4 and her ANC is 1.1.    I reviewed neutropenic precautions with the patient.  She is expected to have her labs rechecked prior to her Port-A-Cath insertion on Monday.  The patient would be considered for G-CSF injections if her ANC were less than 0.6.  The patient is expected to see her family doctor later today because she struggles with recurrent sinus infections and UTI.  She is anticipating receiving antibiotics which is a good idea in the setting of symptoms of recurrent infection and neutropenia.  Of course, I caution the patient should she develop any new or worsening symptoms to ensure she has brought medical evaluation.     She is expected to have a Port-A-Cath placed on Monday.  I reviewed how to use the Emla  cream and sent a  prescription to her pharmacy.  We will change her future lab appointments after the 13th to lab and flush.  She will continue to follow with Dr. Patrcia for palliative radiation to the lung.  She is followed by Dr. Malcolm from neurosurgery regarding her T12 fracture.  Will see her back for follow-up visit in 2 weeks before undergoing cycle #2.  Adenocarcinoma of the colon diagnosed in July, polyp removed in October, followed by colectomy. No evidence of metastasis on recent scans. - Continue monitoring for recurrence or metastasis  She is expected to have second opinion at [facility].   She is worried about missing her brain MRI on Saturday due to the possible winter weather.  I gave her the number to radiology scheduling.  If she is not able  to make her scan, she was advised to call and then reach out to radiation oncology so they could reschedule her brain MRI with SRS protocol.  The patient was advised to call immediately if she has any concerning symptoms in the interval. The patient voices understanding of current disease status and treatment options and is in agreement with the current care plan. All questions were answered. The patient knows to call the clinic with any problems, questions or concerns. We can certainly see the patient much sooner if necessary         No orders of the defined types were placed in this encounter.    The total time spent in the appointment was 20-29 minutes  Jasmine Hammen L Lynard Postlewait, PA-C 02/04/23

## 2023-02-02 ENCOUNTER — Other Ambulatory Visit: Payer: Self-pay

## 2023-02-02 ENCOUNTER — Ambulatory Visit
Admission: RE | Admit: 2023-02-02 | Discharge: 2023-02-02 | Disposition: A | Discharge status: Home / Self Care | Payer: Medicare HMO | Source: Ambulatory Visit | Attending: Radiation Oncology | Admitting: Radiation Oncology

## 2023-02-02 ENCOUNTER — Ambulatory Visit: Payer: Medicare HMO | Admitting: Radiation Oncology

## 2023-02-02 ENCOUNTER — Ambulatory Visit (HOSPITAL_COMMUNITY): Payer: Medicare HMO

## 2023-02-02 DIAGNOSIS — C3412 Malignant neoplasm of upper lobe, left bronchus or lung: Secondary | ICD-10-CM | POA: Diagnosis not present

## 2023-02-02 DIAGNOSIS — C7931 Secondary malignant neoplasm of brain: Secondary | ICD-10-CM | POA: Diagnosis not present

## 2023-02-02 DIAGNOSIS — Z1211 Encounter for screening for malignant neoplasm of colon: Secondary | ICD-10-CM | POA: Diagnosis not present

## 2023-02-02 DIAGNOSIS — Z510 Encounter for antineoplastic radiation therapy: Secondary | ICD-10-CM | POA: Diagnosis not present

## 2023-02-02 LAB — RAD ONC ARIA SESSION SUMMARY
Course Elapsed Days: 15
Plan Fractions Treated to Date: 6
Plan Prescribed Dose Per Fraction: 2 Gy
Plan Total Fractions Prescribed: 33
Plan Total Prescribed Dose: 66 Gy
Reference Point Dosage Given to Date: 12 Gy
Reference Point Session Dosage Given: 2 Gy
Session Number: 6

## 2023-02-03 ENCOUNTER — Ambulatory Visit
Admission: RE | Admit: 2023-02-03 | Discharge: 2023-02-03 | Disposition: A | Discharge status: Home / Self Care | Payer: Medicare HMO | Source: Ambulatory Visit | Attending: Radiation Oncology | Admitting: Radiation Oncology

## 2023-02-03 ENCOUNTER — Ambulatory Visit: Payer: Medicare HMO | Admitting: Radiation Oncology

## 2023-02-03 ENCOUNTER — Other Ambulatory Visit: Payer: Self-pay

## 2023-02-03 ENCOUNTER — Ambulatory Visit: Payer: Medicare HMO

## 2023-02-03 DIAGNOSIS — Z1211 Encounter for screening for malignant neoplasm of colon: Secondary | ICD-10-CM | POA: Diagnosis not present

## 2023-02-03 DIAGNOSIS — C7931 Secondary malignant neoplasm of brain: Secondary | ICD-10-CM | POA: Diagnosis not present

## 2023-02-03 DIAGNOSIS — Z510 Encounter for antineoplastic radiation therapy: Secondary | ICD-10-CM | POA: Diagnosis not present

## 2023-02-03 DIAGNOSIS — C3412 Malignant neoplasm of upper lobe, left bronchus or lung: Secondary | ICD-10-CM | POA: Diagnosis not present

## 2023-02-03 LAB — RAD ONC ARIA SESSION SUMMARY
Course Elapsed Days: 16
Plan Fractions Treated to Date: 7
Plan Prescribed Dose Per Fraction: 2 Gy
Plan Total Fractions Prescribed: 33
Plan Total Prescribed Dose: 66 Gy
Reference Point Dosage Given to Date: 14 Gy
Reference Point Session Dosage Given: 2 Gy
Session Number: 7

## 2023-02-03 NOTE — Progress Notes (Signed)
 Pt calendar for Jan-Mar 2025 printed and provided to radiation technicians to give to patient once she has completed her radiation treatment for that day.

## 2023-02-04 ENCOUNTER — Ambulatory Visit
Admission: RE | Admit: 2023-02-04 | Discharge: 2023-02-04 | Disposition: A | Discharge status: Home / Self Care | Payer: Medicare HMO | Source: Ambulatory Visit | Attending: Radiation Oncology | Admitting: Radiation Oncology

## 2023-02-04 ENCOUNTER — Inpatient Hospital Stay: Payer: Medicare HMO | Admitting: Physician Assistant

## 2023-02-04 ENCOUNTER — Inpatient Hospital Stay: Payer: Medicare HMO

## 2023-02-04 ENCOUNTER — Ambulatory Visit (INDEPENDENT_AMBULATORY_CARE_PROVIDER_SITE_OTHER): Payer: Medicare HMO | Admitting: Emergency Medicine

## 2023-02-04 ENCOUNTER — Other Ambulatory Visit: Payer: Self-pay

## 2023-02-04 ENCOUNTER — Encounter: Payer: Self-pay | Admitting: Emergency Medicine

## 2023-02-04 VITALS — BP 142/58 | HR 100 | Temp 97.8°F | Ht 69.0 in | Wt 165.0 lb

## 2023-02-04 VITALS — BP 145/45 | HR 92 | Temp 97.6°F | Resp 16 | Wt 165.2 lb

## 2023-02-04 DIAGNOSIS — Z7984 Long term (current) use of oral hypoglycemic drugs: Secondary | ICD-10-CM

## 2023-02-04 DIAGNOSIS — E1169 Type 2 diabetes mellitus with other specified complication: Secondary | ICD-10-CM | POA: Diagnosis not present

## 2023-02-04 DIAGNOSIS — Z510 Encounter for antineoplastic radiation therapy: Secondary | ICD-10-CM | POA: Diagnosis not present

## 2023-02-04 DIAGNOSIS — C7931 Secondary malignant neoplasm of brain: Secondary | ICD-10-CM | POA: Diagnosis not present

## 2023-02-04 DIAGNOSIS — R300 Dysuria: Secondary | ICD-10-CM

## 2023-02-04 DIAGNOSIS — F17200 Nicotine dependence, unspecified, uncomplicated: Secondary | ICD-10-CM

## 2023-02-04 DIAGNOSIS — C3412 Malignant neoplasm of upper lobe, left bronchus or lung: Secondary | ICD-10-CM | POA: Diagnosis not present

## 2023-02-04 DIAGNOSIS — S22080A Wedge compression fracture of T11-T12 vertebra, initial encounter for closed fracture: Secondary | ICD-10-CM | POA: Diagnosis not present

## 2023-02-04 DIAGNOSIS — C180 Malignant neoplasm of cecum: Secondary | ICD-10-CM

## 2023-02-04 DIAGNOSIS — Z1211 Encounter for screening for malignant neoplasm of colon: Secondary | ICD-10-CM | POA: Diagnosis not present

## 2023-02-04 DIAGNOSIS — K5730 Diverticulosis of large intestine without perforation or abscess without bleeding: Secondary | ICD-10-CM

## 2023-02-04 DIAGNOSIS — M47816 Spondylosis without myelopathy or radiculopathy, lumbar region: Secondary | ICD-10-CM | POA: Diagnosis not present

## 2023-02-04 DIAGNOSIS — E039 Hypothyroidism, unspecified: Secondary | ICD-10-CM | POA: Diagnosis not present

## 2023-02-04 DIAGNOSIS — I10 Essential (primary) hypertension: Secondary | ICD-10-CM | POA: Diagnosis not present

## 2023-02-04 DIAGNOSIS — F419 Anxiety disorder, unspecified: Secondary | ICD-10-CM

## 2023-02-04 DIAGNOSIS — E785 Hyperlipidemia, unspecified: Secondary | ICD-10-CM

## 2023-02-04 LAB — URINALYSIS, ROUTINE W REFLEX MICROSCOPIC
Bilirubin Urine: NEGATIVE
Ketones, ur: NEGATIVE
Nitrite: POSITIVE — AB
Specific Gravity, Urine: 1.025 (ref 1.000–1.030)
Urine Glucose: NEGATIVE
Urobilinogen, UA: 0.2 (ref 0.0–1.0)
pH: 6 (ref 5.0–8.0)

## 2023-02-04 LAB — CMP (CANCER CENTER ONLY)
ALT: <5 U/L (ref 0–44)
AST: 8 U/L — ABNORMAL LOW (ref 15–41)
Albumin: 4.1 g/dL (ref 3.5–5.0)
Alkaline Phosphatase: 95 U/L (ref 38–126)
Anion gap: 7 (ref 5–15)
BUN: 18 mg/dL (ref 8–23)
CO2: 25 mmol/L (ref 22–32)
Calcium: 9.6 mg/dL (ref 8.9–10.3)
Chloride: 105 mmol/L (ref 98–111)
Creatinine: 0.84 mg/dL (ref 0.44–1.00)
GFR, Estimated: >60 mL/min (ref >60)
Glucose, Bld: 95 mg/dL (ref 70–99)
Potassium: 4.3 mmol/L (ref 3.5–5.1)
Sodium: 137 mmol/L (ref 135–145)
Total Bilirubin: 0.5 mg/dL (ref 0.0–1.2)
Total Protein: 7 g/dL (ref 6.5–8.1)

## 2023-02-04 LAB — CBC WITH DIFFERENTIAL (CANCER CENTER ONLY)
Abs Immature Granulocytes: 0.01 10*3/uL (ref 0.00–0.07)
Basophils Absolute: 0 10*3/uL (ref 0.0–0.1)
Basophils Relative: 1 %
Eosinophils Absolute: 0 10*3/uL (ref 0.0–0.5)
Eosinophils Relative: 0 %
HCT: 35.6 % — ABNORMAL LOW (ref 36.0–46.0)
Hemoglobin: 11.8 g/dL — ABNORMAL LOW (ref 12.0–15.0)
Immature Granulocytes: 0 %
Lymphocytes Relative: 52 %
Lymphs Abs: 1.2 10*3/uL (ref 0.7–4.0)
MCH: 29.3 pg (ref 26.0–34.0)
MCHC: 33.1 g/dL (ref 30.0–36.0)
MCV: 88.3 fL (ref 80.0–100.0)
Monocytes Absolute: 0.1 10*3/uL (ref 0.1–1.0)
Monocytes Relative: 2 %
Neutro Abs: 1.1 10*3/uL — ABNORMAL LOW (ref 1.7–7.7)
Neutrophils Relative %: 45 %
Platelet Count: 178 10*3/uL (ref 150–400)
RBC: 4.03 MIL/uL (ref 3.87–5.11)
RDW: 13.2 % (ref 11.5–15.5)
WBC Count: 2.4 10*3/uL — ABNORMAL LOW (ref 4.0–10.5)
nRBC: 0 % (ref 0.0–0.2)

## 2023-02-04 LAB — RAD ONC ARIA SESSION SUMMARY
Course Elapsed Days: 17
Plan Fractions Treated to Date: 8
Plan Prescribed Dose Per Fraction: 2 Gy
Plan Total Fractions Prescribed: 33
Plan Total Prescribed Dose: 66 Gy
Reference Point Dosage Given to Date: 16 Gy
Reference Point Session Dosage Given: 2 Gy
Session Number: 8

## 2023-02-04 MED ORDER — LIDOCAINE-PRILOCAINE 2.5-2.5 % EX CREA: 1.0000 | TOPICAL_CREAM | CUTANEOUS | 2 refills | Status: DC | PRN

## 2023-02-04 MED ORDER — AMOXICILLIN-POT CLAVULANATE 875-125 MG PO TABS: 1.0000 | ORAL_TABLET | Freq: Two times a day (BID) | ORAL | 0 refills | Status: AC

## 2023-02-04 NOTE — Progress Notes (Signed)
 Jasmine Jasmine 73 y.o.   Chief Complaint  Patient presents with   Follow-up    Patient here for f/u, Diverticulitis is flaring up, possible sinus issue, possible UTI    HISTORY OF PRESENT ILLNESS: This is a 73 y.o. female complaining of possible UTI.  Has some dysuria.  Concerned about diverticulitis.  Has chronic sinus problems Recently diagnosed with metastatic lung cancer.  Just started chemotherapy and radiation Also has adenocarcinoma of colon No other complaints or medical concerns today.  Most recent oncologist office visit assessment and plan as follows: Lanny Callander, MD [address] 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    PRIOR THERAPY: None   CURRENT THERAPY: 1) of systemic chemotherapy with carboplatin  for an AUC 5 and etoposide  100 mg/m on days 1, 2, and 3 IV every 3 weeks.  Imfinzi  will be added once she completes palliative radiation. 2) Palliative radiation to the lung under the care of Dr. Patrcia.  3) scheduled for SRS to the brain on 02/18/23  HPI   Prior to Admission medications   Medication Sig Start Date End Date Taking? Authorizing Provider  ALPRAZolam  (XANAX ) 0.5 MG tablet TAKE 1 TABLET BY MOUTH ONCE DAILY AS NEEDED FOR ANXIETY 01/29/23   Henson, Vickie L, NP-C  atorvastatin  (LIPITOR) 20 MG tablet Take 1 tablet by mouth once daily 08/18/22   Nguyet Mercer Jose, MD  buPROPion  (WELLBUTRIN  XL) 300 MG 24 hr tablet Take 1 tablet by mouth once daily 08/18/22   Aldous Housel Jose, MD  busPIRone  (BUSPAR ) 7.5 MG tablet Take 1  tablet (7.5 mg total) by mouth 2 (two) times daily. 07/16/22   [name], MD  escitalopram  (LEXAPRO ) 10 MG tablet Take 1 tablet (10 mg total) by mouth daily. 12/25/21 12/20/22  [name], MD  gabapentin (NEURONTIN) 300 MG capsule Take 300 mg by mouth as needed (numbness). 08/14/22   [provider]  levothyroxine  (SYNTHROID ) 125 MCG tablet TAKE 1 TABLET BY MOUTH ONCE DAILY BEFORE BREAKFAST 04/14/22   [name], MD  lidocaine  (LIDODERM ) 5 % Place 1 patch onto the skin daily. Remove & Discard patch within 12 hours or as directed by MD 01/12/23   Franklyn Sid SAILOR, MD  lidocaine -prilocaine  (EMLA ) cream Apply 1 Application topically as needed. 02/04/23   Heilingoetter, Cassandra L, PA-C  metFORMIN  (GLUCOPHAGE ) 1000 MG tablet TAKE 1 TABLET BY MOUTH TWICE DAILY WITH A MEAL 08/18/22   Siomara Burkel Jose, MD  metoprolol  succinate (TOPROL -XL) 25 MG 24 hr tablet Take 1 tablet by mouth once daily 05/27/22   Favian Kittleson Jose, MD  nicotine  (NICODERM CQ ) 21 mg/24hr patch Place 1 patch (21 mg total) onto the skin daily. 11/26/22   [name], MD  ondansetron  (ZOFRAN ) 8 MG tablet Take 1 tablet (8 mg total) by mouth every 8 (eight) hours as needed for nausea or vomiting. 01/15/23   Bruning, Ashlyn, PA-C  ondansetron  (ZOFRAN ) 8 MG tablet Take 1 tablet (8 mg total) by mouth every 8 (eight) hours as needed for nausea or vomiting. Start on third day after chemotherapy. 01/18/23  Sherrod Sherrod, MD  pantoprazole  (PROTONIX ) 40 MG tablet Take 1 tablet twice a day for 2 weeks and then daily after that 08/08/22   Jenesis Suchy Jose, MD  prochlorperazine  (COMPAZINE ) 10 MG tablet Take 1 tablet (10 mg total) by mouth every 6 (six) hours as needed for nausea or vomiting (Nausea or vomiting). 01/18/23   Sherrod Sherrod, MD  valsartan -hydrochlorothiazide  (DIOVAN -HCT) 160-12.5 MG tablet Take 1 tablet by mouth once daily 05/27/22   [name], MD    No Known  Allergies  Patient Active Problem List   Diagnosis Date Noted   Screening for colon cancer 12/09/2022   Malignant neoplasm of ascending colon (HCC) 12/09/2022   Small cell lung cancer (HCC) 12/09/2022   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   Weight loss 05/13/2022   Change in multiple pigmented skin lesions 04/22/2022   Paresthesia of both lower extremities 04/22/2022   Abnormal MRI, lumbar spine 04/22/2022   Vitreous floaters of right eye 04/07/2022   Hematoma of scalp 12/25/2021   Smokers' cough (HCC) 12/26/2020   Chronic bilateral low back pain with left-sided sciatica 12/26/2020   Chronic sinus complaints 12/26/2020   Chronic anxiety 06/28/2019   History of peptic ulcer 04/30/2017   Hyperlipidemia 12/11/2015   Insomnia 12/11/2015   Panic attack as reaction to stress 12/11/2015   Current smoker 12/11/2015   Chronic sinusitis 12/11/2015   Dyslipidemia associated with type 2 diabetes mellitus (HCC) 05/15/2015   Hypothyroid 04/29/2011   BMI 40.0-44.9, adult (HCC) 04/29/2011   Depression with anxiety 04/29/2011   GERD (gastroesophageal reflux disease) 04/29/2011   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   Nicotine  addiction 04/29/2011   Essential hypertension 04/29/2011   Heart murmur 04/29/2011    Past Medical History:  Diagnosis Date   Allergy    Anxiety    Depression    Diabetes mellitus without complication (HCC)    Heart murmur    Hypertension    Thyroid  disease    Ulcer     Past Surgical History:  Procedure Laterality Date   BREAST SURGERY     CESAREAN SECTION     CHOLECYSTECTOMY     COLONOSCOPY WITH PROPOFOL  N/A 10/30/2022   Procedure: COLONOSCOPY WITH PROPOFOL ;  Surgeon: Rollin Dover, MD;  Location: [facility];  Service: Gastroenterology;  Laterality: N/A;   ENDOSCOPIC MUCOSAL RESECTION  10/30/2022   Procedure: ENDOSCOPIC MUCOSAL RESECTION;  Surgeon: Rollin Dover, MD;  Location: [facility];  Service: Gastroenterology;;   HEMOSTASIS CLIP PLACEMENT   10/30/2022   Procedure: HEMOSTASIS CLIP PLACEMENT;  Surgeon: Rollin Dover, MD;  Location: [facility];  Service: Gastroenterology;;   NASAL SEPTUM SURGERY     POLYPECTOMY  10/30/2022   Procedure: POLYPECTOMY;  Surgeon: Rollin Dover, MD;  Location: [facility];  Service: Gastroenterology;;   ROBLEY LIFTING INJECTION  10/30/2022   Procedure: SUBMUCOSAL LIFTING INJECTION;  Surgeon: Rollin Dover, MD;  Location: [facility];  Service: Gastroenterology;;    Social History   Socioeconomic History   Marital status: Divorced    Spouse name: Not on file   Number of children: 2   Years of education: Not on file   Highest education level: Bachelor's degree (e.g., BA, AB, BS)  Occupational History   Occupation: Unemployed  Tobacco Use   Smoking status: Every Day    Current packs/day: 0.00    Average packs/day: 1.5 packs/day for 40.0 years (60.0 ttl pk-yrs)    Types: Cigarettes    Start date: 01/20/1972    Last attempt to quit: 01/20/2012  Years since quitting: 11.0   Smokeless tobacco: Never   Tobacco comments:    Pt doing E-cigarette as of 01/20/12  Vaping Use   Vaping status: Former  Substance and Sexual Activity   Alcohol use: No   Drug use: No   Sexual activity: Never  Other Topics Concern   Not on file  Social History Narrative   Patient on worker's compensation for an injury sustained on the job 5 years ago.      Right handed and Left Handed       Lives in a two story home       One son deceased.    Social Drivers of Corporate Investment Banker Strain: Low Risk  (01/09/2023)   Overall Financial Resource Strain (CARDIA)    Difficulty of Paying Living Expenses: Not hard at all  Food Insecurity: No Food Insecurity (01/12/2023)   Hunger Vital Sign    Worried About Running Out of Food in the Last Year: Never true    Ran Out of Food in the Last Year: Never true  Transportation Needs: No Transportation Needs (01/12/2023)   PRAPARE - Scientist, Research (physical Sciences) (Medical): No    Lack of Transportation (Non-Medical): No  Physical Activity: Inactive (01/09/2023)   Exercise Vital Sign    Days of Exercise per Week: 0 days    Minutes of Exercise per Session: 0 min  Stress: Stress Concern Present (01/09/2023)   [employer] of Occupational Health - Occupational Stress Questionnaire    Feeling of Stress : Very much  Social Connections: Socially Isolated (01/09/2023)   Social Connection and Isolation Panel [NHANES]    Frequency of Communication with Friends and Family: Twice a week    Frequency of Social Gatherings with Friends and Family: Never    Attends Religious Services: Never    Database Administrator or Organizations: No    Attends Banker Meetings: Never    Marital Status: Divorced  Catering Manager Violence: Not At Risk (01/12/2023)   Humiliation, Afraid, Rape, and Kick questionnaire    Fear of Current or Ex-Partner: No    Emotionally Abused: No    Physically Abused: No    Sexually Abused: No    Family History  Problem Relation Age of Onset   Lung cancer Mother 70   Dementia Father    Atrial fibrillation Father    Cancer Paternal Aunt        breast cancer   Colon cancer Paternal Aunt    Colon cancer Cousin      Review of Systems  Constitutional: Negative.  Negative for chills and fever.  HENT: Negative.  Negative for congestion and sore throat.   Respiratory: Negative.  Negative for cough and shortness of breath.   Cardiovascular:  Negative for chest pain and palpitations.  Gastrointestinal:  Negative for abdominal pain, diarrhea, nausea and vomiting.  Genitourinary:  Positive for dysuria.  Skin:  Negative for rash.  Neurological:  Negative for dizziness and headaches.  All other systems reviewed and are negative.   Today's Vitals   02/04/23 1321  BP: (!) 142/58  Pulse: 100  Temp: 97.8 F (36.6 C)  TempSrc: Oral  SpO2: 96%  Weight: 165 lb (74.8 kg)  Height: 5' 9 (1.753 m)   Body  mass index is 24.37 kg/m.   Physical Exam Vitals reviewed.  Constitutional:      Appearance: Normal appearance.  HENT:     Head: Normocephalic.  Eyes:  Extraocular Movements: Extraocular movements intact.  Cardiovascular:     Rate and Rhythm: Normal rate and regular rhythm.     Heart sounds: Murmur heard.  Pulmonary:     Effort: Pulmonary effort is normal.     Breath sounds: Normal breath sounds.  Abdominal:     Palpations: Abdomen is soft.     Tenderness: There is no abdominal tenderness. There is no right CVA tenderness or left CVA tenderness.  Skin:    General: Skin is dry.     Capillary Refill: Capillary refill takes less than 2 seconds.  Neurological:     Mental Status: She is alert and oriented to person, place, and time.  Psychiatric:        Mood and Affect: Mood normal.        Behavior: Behavior normal.    Results for orders placed or performed in visit on 02/04/23 (from the past 48 hours)  Urinalysis, Routine w reflex microscopic     Status: Abnormal   Collection Time: 02/04/23  2:12 PM  Result Value Ref Range   Color, Urine YELLOW Yellow;Lt. Yellow;Straw;Dark Yellow;Amber;Green;Red;Brown   APPearance Cloudy (A) Clear;Turbid;Slightly Cloudy;Cloudy   Specific Gravity, Urine 1.025 1.000 - 1.030   pH 6.0 5.0 - 8.0   Total Protein, Urine TRACE (A) Negative   Urine Glucose NEGATIVE Negative   Ketones, ur NEGATIVE Negative   Bilirubin Urine NEGATIVE Negative   Hgb urine dipstick TRACE-INTACT (A) Negative   Urobilinogen, UA 0.2 0.0 - 1.0   Leukocytes,Ua SMALL (A) Negative   Nitrite POSITIVE (A) Negative   WBC, UA 21-50/hpf (A) 0-2/hpf   RBC / HPF 0-2/hpf 0-2/hpf   Mucus, UA Presence of (A) None   Squamous Epithelial / HPF Rare(0-4/hpf) Rare(0-4/hpf)   Bacteria, UA Many(>50/hpf) (A) None     ASSESSMENT & PLAN: A total of 47 minutes was spent with the patient and counseling/coordination of care regarding preparing for this visit, review of most recent office  visit notes, review of most recent oncologist office visit note, review of multiple chronic medical conditions and their management, diagnosis of urinary tract infection and need to start antibiotics, review of all medications, review of most recent bloodwork results, review of health maintenance items, education on nutrition, prognosis, documentation, and need for follow up.   Problem List Items Addressed This Visit       Cardiovascular and Mediastinum   Essential hypertension   Well-controlled hyper tension Continue valsartan  HCT 160-12.5 mg daily Also on metoprolol  succinate 25 mg daily          Respiratory   Small cell lung cancer (HCC)   -This was discovered during her staging CT scan for newly diagnosed colon cancer.  It showed a 4.9 x 2.8 cm subpleural mass in the left upper lobe, and a new supraclavicular and mediastinal adenopathy -Biopsy of the left supraclavicular lymph node showed high-grade neuroendocrine tumor, favor small cell cancer, TTF-1 positive, consistent with lung primary. -PET scan showed hypermetabolic mediastinal and left supraclavicular nodal metastasis in addition to the large left upper lobe primary tumor invading the chest wall.  No other distant metastasis on the PET scan. -Unfortunately her brain MRI showed a 4 mm lesion in the right periatrial lesion, highly concerning for metastasis. -We discussed the chance of cure is minimal due to her significant local regional nodal metastasis and a small brain metastasis, however given her very limited brain metastasis, I recommend concurrent chemoradiation to her thorax, and brain radiation.  -I recommend chemotherapy with  carboplatin , etoposide  and atezolizumab every 3 weeks for 4 cycles, followed by maintenance atezolizumab. Started chemotherapy and radiation.  Tolerating treatment well.      Relevant Medications   amoxicillin -clavulanate (AUGMENTIN ) 875-125 MG tablet     Digestive   Diverticula of colon   No  signs of diverticulitis today.      Adenocarcinoma of cecum (HCC)   Treatment on hold given metastatic lung cancer diagnosis and need for treatment      Relevant Medications   amoxicillin -clavulanate (AUGMENTIN ) 875-125 MG tablet     Endocrine   Hypothyroid   Clinically euthyroid Continue levothyroxine  125 mcg daily      Dyslipidemia associated with type 2 diabetes mellitus (HCC)   Chronic stable conditions Continue metformin  1000 mg twice a day and atorvastatin  20 mg daily          Other   Current smoker   Continues to smoke.  Using nicotine  patches.      Chronic anxiety   Clinically stable.  Continues daily Lexapro  10 mg and BuSpar  7.5 mg twice a day.  Alprazolam  as needed.      Dysuria - Primary   Positive urinalysis.  Urine culture pending. Recommend to start Augmentin  875 mg twice a day for 7 days No clinical findings of pyelonephritis.  Advised to stay well-hydrated.  ED precautions given.  Advised to contact the office if no better or worse during the next several days      Relevant Medications   amoxicillin -clavulanate (AUGMENTIN ) 875-125 MG tablet   Other Relevant Orders   Urine Culture   Urinalysis   Patient Instructions  Health Maintenance After Age 66 After age 34, you are at a higher risk for certain Jasmine-term diseases and infections as well as injuries from falls. Falls are a major cause of broken bones and head injuries in people who are older than age 79. Getting regular preventive care can help to keep you healthy and well. Preventive care includes getting regular testing and making lifestyle changes as recommended by your health care provider. Talk with your health care provider about: Which screenings and tests you should have. A screening is a test that checks for a disease when you have no symptoms. A diet and exercise plan that is right for you. What should I know about screenings and tests to prevent falls? Screening and testing are the best  ways to find a health problem early. Early diagnosis and treatment give you the best chance of managing medical conditions that are common after age 79. Certain conditions and lifestyle choices may make you more likely to have a fall. Your health care provider may recommend: Regular vision checks. Poor vision and conditions such as cataracts can make you more likely to have a fall. If you wear glasses, make sure to get your prescription updated if your vision changes. Medicine review. Work with your health care provider to regularly review all of the medicines you are taking, including over-the-counter medicines. Ask your health care provider about any side effects that may make you more likely to have a fall. Tell your health care provider if any medicines that you take make you feel dizzy or sleepy. Strength and balance checks. Your health care provider may recommend certain tests to check your strength and balance while standing, walking, or changing positions. Foot health exam. Foot pain and numbness, as well as not wearing proper footwear, can make you more likely to have a fall. Screenings, including: Osteoporosis screening. Osteoporosis is a  condition that causes the bones to get weaker and break more easily. Blood pressure screening. Blood pressure changes and medicines to control blood pressure can make you feel dizzy. Depression screening. You may be more likely to have a fall if you have a fear of falling, feel depressed, or feel unable to do activities that you used to do. Alcohol use screening. Using too much alcohol can affect your balance and may make you more likely to have a fall. Follow these instructions at home: Lifestyle Do not drink alcohol if: Your health care provider tells you not to drink. If you drink alcohol: Limit how much you have to: 0-1 drink a day for women. 0-2 drinks a day for men. Know how much alcohol is in your drink. In the [place] one drink equals one 12 oz  bottle of beer (355 mL), one 5 oz glass of wine (148 mL), or one 1 oz glass of hard liquor (44 mL). Do not use any products that contain nicotine  or tobacco. These products include cigarettes, chewing tobacco, and vaping devices, such as e-cigarettes. If you need help quitting, ask your health care provider. Activity  Follow a regular exercise program to stay fit. This will help you maintain your balance. Ask your health care provider what types of exercise are appropriate for you. If you need a cane or walker, use it as recommended by your health care provider. Wear supportive shoes that have nonskid soles. Safety  Remove any tripping hazards, such as rugs, cords, and clutter. Install safety equipment such as grab bars in bathrooms and safety rails on stairs. Keep rooms and walkways well-lit. General instructions Talk with your health care provider about your risks for falling. Tell your health care provider if: You fall. Be sure to tell your health care provider about all falls, even ones that seem minor. You feel dizzy, tiredness (fatigue), or off-balance. Take over-the-counter and prescription medicines only as told by your health care provider. These include supplements. Eat a healthy diet and maintain a healthy weight. A healthy diet includes low-fat dairy products, low-fat (lean) meats, and fiber from whole grains, beans, and lots of fruits and vegetables. Stay current with your vaccines. Schedule regular health, dental, and eye exams. Summary Having a healthy lifestyle and getting preventive care can help to protect your health and wellness after age 20. Screening and testing are the best way to find a health problem early and help you avoid having a fall. Early diagnosis and treatment give you the best chance for managing medical conditions that are more common for people who are older than age 39. Falls are a major cause of broken bones and head injuries in people who are older than  age 22. Take precautions to prevent a fall at home. Work with your health care provider to learn what changes you can make to improve your health and wellness and to prevent falls. This information is not intended to replace advice given to you by your health care provider. Make sure you discuss any questions you have with your health care provider. Document Revised: 06/03/2020 Document Reviewed: 06/03/2020 Elsevier Patient Education  2024 [employer]     Emil Schaumann, MD [facility]

## 2023-02-04 NOTE — Patient Instructions (Signed)
 Health Maintenance After Age 71 After age 27, you are at a higher risk for certain long-term diseases and infections as well as injuries from falls. Falls are a major cause of broken bones and head injuries in people who are older than age 73. Getting regular preventive care can help to keep you healthy and well. Preventive care includes getting regular testing and making lifestyle changes as recommended by your health care provider. Talk with your health care provider about: Which screenings and tests you should have. A screening is a test that checks for a disease when you have no symptoms. A diet and exercise plan that is right for you. What should I know about screenings and tests to prevent falls? Screening and testing are the best ways to find a health problem early. Early diagnosis and treatment give you the best chance of managing medical conditions that are common after age 90. Certain conditions and lifestyle choices may make you more likely to have a fall. Your health care provider may recommend: Regular vision checks. Poor vision and conditions such as cataracts can make you more likely to have a fall. If you wear glasses, make sure to get your prescription updated if your vision changes. Medicine review. Work with your health care provider to regularly review all of the medicines you are taking, including over-the-counter medicines. Ask your health care provider about any side effects that may make you more likely to have a fall. Tell your health care provider if any medicines that you take make you feel dizzy or sleepy. Strength and balance checks. Your health care provider may recommend certain tests to check your strength and balance while standing, walking, or changing positions. Foot health exam. Foot pain and numbness, as well as not wearing proper footwear, can make you more likely to have a fall. Screenings, including: Osteoporosis screening. Osteoporosis is a condition that causes  the bones to get weaker and break more easily. Blood pressure screening. Blood pressure changes and medicines to control blood pressure can make you feel dizzy. Depression screening. You may be more likely to have a fall if you have a fear of falling, feel depressed, or feel unable to do activities that you used to do. Alcohol  use screening. Using too much alcohol  can affect your balance and may make you more likely to have a fall. Follow these instructions at home: Lifestyle Do not drink alcohol  if: Your health care provider tells you not to drink. If you drink alcohol : Limit how much you have to: 0-1 drink a day for women. 0-2 drinks a day for men. Know how much alcohol  is in your drink. In the [place] one drink equals one 12 oz bottle of beer (355 mL), one 5 oz glass of wine (148 mL), or one 1 oz glass of hard liquor (44 mL). Do not use any products that contain nicotine or tobacco. These products include cigarettes, chewing tobacco, and vaping devices, such as e-cigarettes. If you need help quitting, ask your health care provider. Activity  Follow a regular exercise program to stay fit. This will help you maintain your balance. Ask your health care provider what types of exercise are appropriate for you. If you need a cane or walker, use it as recommended by your health care provider. Wear supportive shoes that have nonskid soles. Safety  Remove any tripping hazards, such as rugs, cords, and clutter. Install safety equipment such as grab bars in bathrooms and safety rails on stairs. Keep rooms and walkways  well-lit. General instructions Talk with your health care provider about your risks for falling. Tell your health care provider if: You fall. Be sure to tell your health care provider about all falls, even ones that seem minor. You feel dizzy, tiredness (fatigue), or off-balance. Take over-the-counter and prescription medicines only as told by your health care provider. These include  supplements. Eat a healthy diet and maintain a healthy weight. A healthy diet includes low-fat dairy products, low-fat (lean) meats, and fiber from whole grains, beans, and lots of fruits and vegetables. Stay current with your vaccines. Schedule regular health, dental, and eye exams. Summary Having a healthy lifestyle and getting preventive care can help to protect your health and wellness after age 15. Screening and testing are the best way to find a health problem early and help you avoid having a fall. Early diagnosis and treatment give you the best chance for managing medical conditions that are more common for people who are older than age 42. Falls are a major cause of broken bones and head injuries in people who are older than age 64. Take precautions to prevent a fall at home. Work with your health care provider to learn what changes you can make to improve your health and wellness and to prevent falls. This information is not intended to replace advice given to you by your health care provider. Make sure you discuss any questions you have with your health care provider. Document Revised: 06/03/2020 Document Reviewed: 06/03/2020 Elsevier Patient Education  2024 [employer].

## 2023-02-05 ENCOUNTER — Ambulatory Visit
Admission: RE | Admit: 2023-02-05 | Discharge: 2023-02-05 | Disposition: A | Discharge status: Home / Self Care | Payer: Medicare HMO | Source: Ambulatory Visit | Attending: Radiation Oncology

## 2023-02-05 ENCOUNTER — Other Ambulatory Visit: Payer: Self-pay

## 2023-02-05 DIAGNOSIS — Z510 Encounter for antineoplastic radiation therapy: Secondary | ICD-10-CM | POA: Diagnosis not present

## 2023-02-05 DIAGNOSIS — C7931 Secondary malignant neoplasm of brain: Secondary | ICD-10-CM | POA: Diagnosis not present

## 2023-02-05 DIAGNOSIS — Z1211 Encounter for screening for malignant neoplasm of colon: Secondary | ICD-10-CM | POA: Diagnosis not present

## 2023-02-05 DIAGNOSIS — C3412 Malignant neoplasm of upper lobe, left bronchus or lung: Secondary | ICD-10-CM | POA: Diagnosis not present

## 2023-02-05 LAB — RAD ONC ARIA SESSION SUMMARY
Course Elapsed Days: 18
Plan Fractions Treated to Date: 9
Plan Prescribed Dose Per Fraction: 2 Gy
Plan Total Fractions Prescribed: 33
Plan Total Prescribed Dose: 66 Gy
Reference Point Dosage Given to Date: 18 Gy
Reference Point Session Dosage Given: 2 Gy
Session Number: 9

## 2023-02-06 ENCOUNTER — Other Ambulatory Visit (HOSPITAL_COMMUNITY): Payer: Self-pay | Admitting: Student

## 2023-02-06 ENCOUNTER — Ambulatory Visit (HOSPITAL_COMMUNITY): Payer: Medicare HMO

## 2023-02-06 DIAGNOSIS — R300 Dysuria: Secondary | ICD-10-CM | POA: Insufficient documentation

## 2023-02-06 NOTE — Assessment & Plan Note (Signed)
 Well-controlled hyper tension Continue valsartan HCT 160-12.5 mg daily Also on metoprolol succinate 25 mg daily

## 2023-02-06 NOTE — Assessment & Plan Note (Signed)
 Chronic stable conditions Continue metformin 1000 mg twice a day and atorvastatin 20 mg daily

## 2023-02-06 NOTE — Assessment & Plan Note (Signed)
Clinically euthyroid.  Continue levothyroxine 125 mcg daily.

## 2023-02-06 NOTE — Assessment & Plan Note (Addendum)
 Treatment on hold given metastatic lung cancer diagnosis and need for treatment

## 2023-02-06 NOTE — Assessment & Plan Note (Signed)
-  This was discovered during her staging CT scan for newly diagnosed colon cancer.  It showed a 4.9 x 2.8 cm subpleural mass in the left upper lobe, and a new supraclavicular and mediastinal adenopathy -Biopsy of the left supraclavicular lymph node showed high-grade neuroendocrine tumor, favor small cell cancer, TTF-1 positive, consistent with lung primary. -PET scan showed hypermetabolic mediastinal and left supraclavicular nodal metastasis in addition to the large left upper lobe primary tumor invading the chest wall.  No other distant metastasis on the PET scan. -Unfortunately her brain MRI showed a 4 mm lesion in the right periatrial lesion, highly concerning for metastasis. -We discussed the chance of cure is minimal due to her significant local regional nodal metastasis and a small brain metastasis, however given her very limited brain metastasis, I recommend concurrent chemoradiation to her thorax, and brain radiation.  -I recommend chemotherapy with carboplatin, etoposide and atezolizumab every 3 weeks for 4 cycles, followed by maintenance atezolizumab. Started chemotherapy and radiation.  Tolerating treatment well.

## 2023-02-06 NOTE — Assessment & Plan Note (Signed)
 Clinically stable. Continues daily Lexapro 10 mg and BuSpar 7.5 mg twice a day. Alprazolam as needed.

## 2023-02-06 NOTE — Assessment & Plan Note (Signed)
 No signs of diverticulitis today.

## 2023-02-06 NOTE — Assessment & Plan Note (Addendum)
 Positive urinalysis.  Urine culture pending. Recommend to start Augmentin  875 mg twice a day for 7 days No clinical findings of pyelonephritis.  Advised to stay well-hydrated.  ED precautions given.  Advised to contact the office if no better or worse during the next several days

## 2023-02-06 NOTE — Assessment & Plan Note (Signed)
 Continues to smoke.  Using nicotine patches.

## 2023-02-07 LAB — URINE CULTURE

## 2023-02-08 ENCOUNTER — Ambulatory Visit: Payer: Medicare HMO | Admitting: Radiation Oncology

## 2023-02-08 ENCOUNTER — Other Ambulatory Visit (HOSPITAL_COMMUNITY): Payer: Self-pay | Admitting: Student

## 2023-02-08 ENCOUNTER — Inpatient Hospital Stay: Payer: Medicare HMO

## 2023-02-08 ENCOUNTER — Telehealth: Payer: Self-pay | Admitting: Radiation Therapy

## 2023-02-08 ENCOUNTER — Ambulatory Visit (HOSPITAL_COMMUNITY)
Admission: RE | Admit: 2023-02-08 | Discharge: 2023-02-08 | Disposition: A | Discharge status: Home / Self Care | Payer: Medicare HMO | Source: Ambulatory Visit | Attending: Internal Medicine | Admitting: Internal Medicine

## 2023-02-08 ENCOUNTER — Ambulatory Visit: Payer: Medicare HMO

## 2023-02-08 DIAGNOSIS — S22080A Wedge compression fracture of T11-T12 vertebra, initial encounter for closed fracture: Secondary | ICD-10-CM

## 2023-02-08 NOTE — Telephone Encounter (Signed)
 I received a vm from [name] stating that she has been having diarrhea all weekend, she feels this is coming from the antibiotic given by Dr. Emil for her sinus infection and diverticulitis. She does not want to come in for her appointments today due to this and has asked for a return call to reschedule.   I will share this with Duwaine Salmon RN and the RT support team.   Jasmine Long R.T.(R)(T)

## 2023-02-08 NOTE — Telephone Encounter (Signed)
 I left a detailed message for Ms. Hutt requesting a call back about the rescheduled brain MRI and SRS simulation appointments.   [name] R.T.(R)(T) Radiation Special Procedures Navigator

## 2023-02-08 NOTE — Telephone Encounter (Signed)
 Copied from CRM [id number]. Topic: Clinical - Prescription Issue >> Feb 05, 2023  1:23 PM Macario HERO wrote: Reason for CRM: Patient called and would like to leave a message for Dr. Emil, just picked up her medication of ALPRAZolam  (XANAX ) 0.5 MG tablet [[id number]] she usually gets 30 tablets and only 10 was prescribed by Orie Mackintosh. Patient stated she is not pleased with the change from [facility]. She will run out and will need her 30 day supply.

## 2023-02-09 ENCOUNTER — Telehealth: Payer: Self-pay | Admitting: Radiation Therapy

## 2023-02-09 ENCOUNTER — Encounter: Payer: Self-pay | Admitting: Internal Medicine

## 2023-02-09 ENCOUNTER — Ambulatory Visit: Payer: Medicare HMO | Admitting: Radiation Oncology

## 2023-02-09 ENCOUNTER — Ambulatory Visit: Payer: Medicare HMO

## 2023-02-09 ENCOUNTER — Other Ambulatory Visit: Payer: Self-pay | Admitting: Emergency Medicine

## 2023-02-09 ENCOUNTER — Ambulatory Visit: Payer: Self-pay | Admitting: Emergency Medicine

## 2023-02-09 DIAGNOSIS — F4323 Adjustment disorder with mixed anxiety and depressed mood: Secondary | ICD-10-CM

## 2023-02-09 DIAGNOSIS — F418 Other specified anxiety disorders: Secondary | ICD-10-CM

## 2023-02-09 MED ORDER — ALPRAZOLAM 0.5 MG PO TABS: 0.5000 mg | ORAL_TABLET | Freq: Two times a day (BID) | ORAL | 3 refills | Status: DC | PRN

## 2023-02-09 NOTE — Telephone Encounter (Signed)
 Most likely from augmenting medication.  Thanks.

## 2023-02-09 NOTE — Telephone Encounter (Signed)
Patient was notified and had no further questions.
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
## 2023-02-09 NOTE — Telephone Encounter (Signed)
 I left another vm for the patient about her brain MRI and simulation appointments on her home line. Her cell is not allowing messages to be left and her son's voicemail box is full.   [name] R.T(R)(T) Radiation Special Procedures Navigator

## 2023-02-09 NOTE — Progress Notes (Signed)
 I received word from Devere Perch, RN that the pt was have issues with diarrhea and wasn't comfortable coming in for her port placement and radiation today. I reached out to the pt and her son to find out when would be a good day/time to reschedule her port placement. No answer at any of the numbers provided. I reached out to IR schedulers at [facility] and [facility]. Soonest available appt was 1/27 at [facility] with a 12pm arrival. I asked schedulers to put the pt there and I will reach out to the pt again at a later time to let her know her new appt.

## 2023-02-09 NOTE — Telephone Encounter (Signed)
 Copied from CRM [id number]. Topic: Clinical - Red Word Triage >> Feb 09, 2023  8:36 AM Jasmine Long wrote: Red Word that prompted transfer to Nurse Triage: Patient called in stating she's Nausea & has diarrhea would like to know if it's a part of diverticulitis or a bug   Chief Complaint: Nausea and Diarrhea Symptoms: mucus in diarrhea, nausea, dry heaves Frequency: intermittent Pertinent Negatives: Patient denies fever or pain Disposition: [] ED /[] Urgent Care (no appt availability in office) / [] Appointment(In office/virtual)/ []  [facility]/ [] Home Care/ [x] Refused Recommended Disposition /[] [facility]/ []  Follow-up with PCP Additional Notes: Pt started on Augmentin  4 days ago. Started having diarrhea with mucus 2 days ago. Pt says that she also feels nauseous, and has been taking Compazine . Pt concerned if symptoms are from antibiotics interracting with medications, or if she possibly developed a stomach bug. Pt refusing to schedule at this time. Prefers to speak with physician. This RN will route to clinical pool [facility] for follow-up.   Reason for Disposition  MODERATE diarrhea (e.g., 4-6 times / day more than normal)  Answer Assessment - Initial Assessment Questions 1. ANTIBIOTIC: What antibiotic are you taking? How many times per day?     Augmentin   2. ANTIBIOTIC ONSET: When was the antibiotic started?     Started on Friday, 4 days ago  3. DIARRHEA SEVERITY: How bad is the diarrhea? How many more stools have you had in the past 24 hours than normal?    - NO DIARRHEA (SCALE 0)   - MILD (SCALE 1-3): Few loose or mushy BMs; increase of 1-3 stools over normal daily number of stools; mild increase in ostomy output.   -  MODERATE (SCALE 4-7): Increase of 4-6 stools daily over normal; moderate increase in ostomy output. * SEVERE (SCALE 8-10; OR 'WORST POSSIBLE'): Increase of 7 or more stools daily over normal; moderate increase in ostomy output; incontinence.      Mild diarrhea, has mucus coming out as well  4. ONSET: When did the diarrhea begin?      Sunday, 2 days after abx started, BM started to get normal yesterday, but returned again this morning   5. BM CONSISTENCY: How loose or watery is the diarrhea?      Watery and mucus  6. VOMITING: Are you also vomiting? If Yes, ask: How many times in the past 24 hours?      No vomiting  7. ABDOMEN PAIN: Are you having any abdomen pain? If Yes, ask: What does it feel like? (e.g., crampy, dull, intermittent, constant)      No pain; Just mild pain 2 days ago  8. ABDOMEN PAIN SEVERITY: If present, ask: How bad is the pain?  (e.g., Scale 1-10; mild, moderate, or severe)   - MILD (1-3): doesn't interfere with normal activities, abdomen soft and not tender to touch    - MODERATE (4-7): interferes with normal activities or awakens from sleep, abdomen tender to touch    - SEVERE (8-10): excruciating pain, doubled over, unable to do any normal activities       1  9. ORAL INTAKE: If vomiting, Have you been able to drink liquids? How much liquids have you had in the past 24 hours?     Has been drinking good  10. HYDRATION: Any signs of dehydration? (e.g., dry mouth [not just dry lips], too weak to stand, dizziness, new weight loss) When did you last urinate?       Not dehydrated  11. EXPOSURE: Have you traveled to a foreign country recently? Have you been exposed to anyone with diarrhea? Could you have eaten any food that was spoiled?       Has not been around anyone with diarrhea. Did not eat spoiled food  12. OTHER SYMPTOMS: Do you have any other symptoms? (e.g., fever, blood in stool)       No other symptoms  13. PREGNANCY: Is there any chance you are pregnant? When was your last menstrual period?       N/A  Protocols used: Diarrhea on Antibiotics-A-AH

## 2023-02-10 ENCOUNTER — Ambulatory Visit: Payer: Medicare HMO

## 2023-02-10 ENCOUNTER — Telehealth: Payer: Self-pay | Admitting: Radiation Therapy

## 2023-02-10 NOTE — Progress Notes (Signed)
 I called the pt at 10:13 and 10:14 to check on her and arrange her port schedule. Pt did not answer her cell phone and I was unable to leave a message. Pt did not answer her home phone, no answer but I did leave a VM.  At 10:15, I also tried the pt's son, Audrene Blessing. Someone did pick up when I called his mobile number, however no one answered on the other end when I said hello several times.  I will try again at a later time.

## 2023-02-10 NOTE — Telephone Encounter (Signed)
 I called to check in with Ms. Jasmine Long about the brain MRI and simulation appointments scheduled. Her cell does not allow a message to be left. I left a detailed message including my contact information and a request to call back on her home line, and attempted to call her son, but there was no answer and his vm box is full so I was unable to leave a message for him.   Axel Bohr R.T.(R)(T) Radiation Special Procedures Navigator

## 2023-02-11 ENCOUNTER — Ambulatory Visit: Payer: Medicare HMO

## 2023-02-11 ENCOUNTER — Telehealth: Payer: Self-pay

## 2023-02-11 ENCOUNTER — Telehealth: Payer: Self-pay | Admitting: Emergency Medicine

## 2023-02-11 ENCOUNTER — Telehealth: Payer: Self-pay | Admitting: Radiation Therapy

## 2023-02-11 NOTE — Telephone Encounter (Signed)
Copied from CRM [id number]. Topic: Clinical - Medication Question >> Feb 11, 2023 11:31 AM Leavy Cella D wrote: Reason for CRM: Darl Pikes from [facility] called in regards to patient experiencing diarrhea due to taking amoxicillin-clavulanate (AUGMENTIN) 875-125 MG tablet. Darl Pikes would like to know if patient could take Imodium to help with the diarrhea.Please give Darl Pikes a call back [phone]

## 2023-02-11 NOTE — Telephone Encounter (Signed)
I spoke with Jasmine Long about her upcoming brain MRI on 1/18 and SIM on 1/20. We are hopeful that she will be able to make those appts. Her last date for taking Augmentin is Friday 1/17. She has been struggling with nausea and diarrhea while taking this med.   [name] R.T.(R)(T) Radiation Special Procedures Navigator

## 2023-02-12 ENCOUNTER — Ambulatory Visit: Payer: Medicare HMO | Admitting: Radiation Oncology

## 2023-02-12 ENCOUNTER — Ambulatory Visit: Payer: Medicare HMO

## 2023-02-12 NOTE — Telephone Encounter (Signed)
Spoke with patient and made her aware it was okay to take imodium. Pt states she has been and is feeling a little better still having mucus in her diarrhea but it has calm done, she has stopped vomiting and just feeling nausea now. She was advise to call our office to schedule an appointment if she is feeling worse or not better by Monday. She understood

## 2023-02-12 NOTE — Progress Notes (Deleted)
[facility] OFFICE PROGRESS NOTE  Jasmine Quint, MD [address] 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   PRIOR THERAPY: None  CURRENT THERAPY:  1) of systemic chemotherapy with carboplatin for an AUC 5 and etoposide 100 mg/m on days 1, 2, and 3 IV every 3 weeks.  Imfinzi will be added once she completes palliative radiation. 2) Palliative radiation to the lung under the care of J[facility].  3) scheduled for SRS to the brain on 02/18/23 ***Cancelled ***  INTERVAL HISTORY: J[facility] Long 73 y.o. female returns to the clinic today for a follow-up visit.  The patient was recently diagnosed with extensive stage small cell lung cancer.  She is currently undergoing palliative radiation to the lung. She does have metastatic disease to the brain and she is expected to go [facility] on 02/18/2023 under the care of [facility]. She is expected to have her brain MRI SRS protocol on Saturday but she is worried she won't be able to make it with the possible winter weather expected.  The patient is interested in a second opinion and she is expected to see [facility] in the future but ***  She tolerated her first cycle of treatment well without any concerning adverse side effects but has concerns with recurrent sinus infections and urinary tract infections.  She saw her PCP who prescribed Augmentin for positive urinalysis.  The patient did develop some diarrhea in the interval possibly secondary to antibiotic use which has since ***.  Patient ensured to  ***treat?  Nausea?  Due to the diarrhea, she was not able to have her Port-A-Cath placement which has been rescheduled for***   Patient concerned about pursuing SRS?  [facility] is planning on adding immunotherapy once she completes palliative radiation to the lung.   He denies any breathing changes since she was last seen.  She denies any significant dyspnea on exertion.  She reports some cough secondary to postnasal drainage.  Denies any hemoptysis.  She uses Salonpas patches for back pain.  She does not like pain medication because it affects her mentation. enies any headache or visual changes.  She denies any fever, chills, or night sweats.  Appetite?  Abdominal pain?  Dysuria?.  Suprapubic discomfort?  Blood in the stool?  He is here today for evaluation repeat blood work before undergoing cycle #2.  MEDICAL HISTORY: Past Medical History:  Diagnosis Date   Allergy    Anxiety    Depression    Diabetes mellitus without complication (HCC)    Heart murmur    Hypertension    Thyroid disease    Ulcer     ALLERGIES:  has no known allergies.  MEDICATIONS:  Current Outpatient Medications  Medication Sig Dispense Refill   ALPRAZolam (XANAX) 0.5 MG tablet Take 1 tablet (0.5 mg total) by mouth 2 (two) times daily as needed for anxiety. 30 tablet 3   atorvastatin (LIPITOR) 20 MG tablet Take 1 tablet by mouth once daily 90 tablet 1   buPROPion (WELLBUTRIN XL) 300 MG 24 hr tablet Take 1 tablet by mouth  once daily 90 tablet 1   busPIRone (BUSPAR) 7.5 MG tablet Take 1 tablet (7.5 mg total) by mouth 2 (two) times daily. 60 tablet 1   escitalopram (LEXAPRO) 10 MG tablet Take 1 tablet (10 mg total) by mouth daily. 90 tablet 3   gabapentin (NEURONTIN) 300 MG capsule Take 300 mg by mouth as needed (numbness).     levothyroxine (SYNTHROID) 125 MCG tablet TAKE 1 TABLET BY MOUTH ONCE DAILY BEFORE BREAKFAST 90 tablet 0   lidocaine (LIDODERM) 5 % Place 1 patch onto the skin daily. Remove & Discard  patch within 12 hours or as directed by MD 30 patch 0   lidocaine-prilocaine (EMLA) cream Apply 1 Application topically as needed. 30 g 2   metFORMIN (GLUCOPHAGE) 1000 MG tablet TAKE 1 TABLET BY MOUTH TWICE DAILY WITH A MEAL 180 tablet 1   metoprolol succinate (TOPROL-XL) 25 MG 24 hr tablet Take 1 tablet by mouth once daily 90 tablet 3   nicotine (NICODERM CQ) 21 mg/24hr patch Place 1 patch (21 mg total) onto the skin daily. 28 patch 3   ondansetron (ZOFRAN) 8 MG tablet Take 1 tablet (8 mg total) by mouth every 8 (eight) hours as needed for nausea or vomiting. 30 tablet 1   ondansetron (ZOFRAN) 8 MG tablet Take 1 tablet (8 mg total) by mouth every 8 (eight) hours as needed for nausea or vomiting. Start on third day after chemotherapy. 30 tablet 1   pantoprazole (PROTONIX) 40 MG tablet Take 1 tablet twice a day for 2 weeks and then daily after that 90 tablet 3   prochlorperazine (COMPAZINE) 10 MG tablet Take 1 tablet (10 mg total) by mouth every 6 (six) hours as needed for nausea or vomiting (Nausea or vomiting). 30 tablet 1   valsartan-hydrochlorothiazide (DIOVAN-HCT) 160-12.5 MG tablet Take 1 tablet by mouth once daily 90 tablet 3   No current facility-administered medications for this visit.    SURGICAL HISTORY:  Past Surgical History:  Procedure Laterality Date   BREAST SURGERY     CESAREAN SECTION     CHOLECYSTECTOMY     COLONOSCOPY WITH PROPOFOL N/A 10/30/2022   Procedure: COLONOSCOPY WITH PROPOFOL;  Surgeon: Jeani Hawking, MD;  Location: [facility];  Service: Gastroenterology;  Laterality: N/A;   ENDOSCOPIC MUCOSAL RESECTION  10/30/2022   Procedure: ENDOSCOPIC MUCOSAL RESECTION;  Surgeon: Jeani Hawking, MD;  Location: [facility];  Service: Gastroenterology;;   HEMOSTASIS CLIP PLACEMENT  10/30/2022   Procedure: HEMOSTASIS CLIP PLACEMENT;  Surgeon: Jeani Hawking, MD;  Location: [facility];  Service: Gastroenterology;;   NASAL SEPTUM SURGERY     POLYPECTOMY  10/30/2022   Procedure:  POLYPECTOMY;  Surgeon: Jeani Hawking, MD;  Location: [facility];  Service: Gastroenterology;;   Sunnie Nielsen LIFTING INJECTION  10/30/2022   Procedure: SUBMUCOSAL LIFTING INJECTION;  Surgeon: Jeani Hawking, MD;  Location: [facility];  Service: Gastroenterology;;    REVIEW OF SYSTEMS:   Review of Systems  Constitutional: Negative for appetite change, chills, fatigue, fever and unexpected weight change.  HENT:   Negative for mouth sores, nosebleeds, sore throat and trouble swallowing.   Eyes: Negative for eye problems and icterus.  Respiratory: Negative for cough, hemoptysis, shortness of breath and wheezing.   Cardiovascular: Negative for chest pain and leg swelling.  Gastrointestinal: Negative for abdominal pain, constipation, diarrhea, nausea and vomiting.  Genitourinary: Negative for bladder incontinence, difficulty urinating, dysuria, frequency and hematuria.   Musculoskeletal: Negative for back pain, gait problem, neck pain and neck stiffness.  Skin: Negative for  itching and rash.  Neurological: Negative for dizziness, extremity weakness, gait problem, headaches, light-headedness and seizures.  Hematological: Negative for adenopathy. Does not bruise/bleed easily.  Psychiatric/Behavioral: Negative for confusion, depression and sleep disturbance. The patient is not nervous/anxious.     PHYSICAL EXAMINATION:  There were no vitals taken for this visit.  ECOG PERFORMANCE STATUS: {CHL ONC ECOG [id number]  Physical Exam  Constitutional: Oriented to person, place, and time and well-developed, well-nourished, and in no distress. No distress.  HENT:  Head: Normocephalic and atraumatic.  Mouth/Throat: Oropharynx is clear and moist. No oropharyngeal exudate.  Eyes: Conjunctivae are normal. Right eye exhibits no discharge. Left eye exhibits no discharge. No scleral icterus.  Neck: Normal range of motion. Neck supple.  Cardiovascular: Normal rate, regular rhythm, normal heart sounds and  intact distal pulses.   Pulmonary/Chest: Effort normal and breath sounds normal. No respiratory distress. No wheezes. No rales.  Abdominal: Soft. Bowel sounds are normal. Exhibits no distension and no mass. There is no tenderness.  Musculoskeletal: Normal range of motion. Exhibits no edema.  Lymphadenopathy:    No cervical adenopathy.  Neurological: Alert and oriented to person, place, and time. Exhibits normal muscle tone. Gait normal. Coordination normal.  Skin: Skin is warm and dry. No rash noted. Not diaphoretic. No erythema. No pallor.  Psychiatric: Mood, memory and judgment normal.  Vitals reviewed.  LABORATORY DATA: Lab Results  Component Value Date   WBC 2.4 (L) 02/04/2023   HGB 11.8 (L) 02/04/2023   HCT 35.6 (L) 02/04/2023   MCV 88.3 02/04/2023   PLT 178 02/04/2023      Chemistry      Component Value Date/Time   NA 137 02/04/2023 0844   NA 134 09/11/2022 1259   K 4.3 02/04/2023 0844   CL 105 02/04/2023 0844   CO2 25 02/04/2023 0844   BUN 18 02/04/2023 0844   BUN 12 09/11/2022 1259   CREATININE 0.84 02/04/2023 0844   CREATININE 0.98 10/30/2013 1118      Component Value Date/Time   CALCIUM 9.6 02/04/2023 0844   ALKPHOS 95 02/04/2023 0844   AST 8 (L) 02/04/2023 0844   ALT <5 02/04/2023 0844   BILITOT 0.5 02/04/2023 0844       RADIOGRAPHIC STUDIES:  No results found.   ASSESSMENT/PLAN:  This is a very pleasant 73 year old Caucasian female with extensive stage (T2b, N3, M1 B) small cell lung cancer.  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  She was diagnosed in December 2024 -The patient also has early stage IV colon adenocarcinoma that was diagnosed on colonoscopy but surgical resection is currently on hold until management of her small cell lung cancer.   She is currently undergoing palliative radiation to the lung  and expected to undergo SRS to the metastatic brain lesion on 02/18/2023 under the care of [facility].   She is currently undergoing palliative systemic chemotherapy with carboplatin for an AUC of 5 on day 1, etoposide 100 mg/m on days 1, 2, and 3 IV every 3 weeks.  [facility] will add Imfinzi 1500 mg on day 1 once she completes her palliative radiation.   Labs were reviewed.  Her white blood cell count is 2.4 and her ANC is 1.1.     I reviewed neutropenic precautions with the patient.  She is expected to have her labs rechecked prior to her Port-A-Cath  insertion on Monday.  The patient would be considered for G-CSF injections if her ANC were less than 0.6.  The patient is expected to see her family doctor later today because she struggles with recurrent sinus infections and UTI.  She is anticipating receiving antibiotics which is a good idea in the setting of symptoms of recurrent infection and neutropenia.  Of course, I caution the patient should she develop any new or worsening symptoms to ensure she has brought medical evaluation.       She is expected to have a Port-A-Cath placed on ***.     She will continue to follow with [facility] for palliative radiation to the lung.  She is followed by Dr. Dutch Long from neurosurgery regarding her T12 fracture.   Will see her back for follow-up visit in 3 weeks before undergoing cycle #3.  I will arrange for restaging CT scan of the chest, abdomen, and pelvis prior to starting her next cycle of treatment.   Adenocarcinoma of the colon diagnosed in July, polyp removed in October, followed by colectomy. No evidence of metastasis on recent scans. - Continue monitoring for recurrence or metastasis   She is expected to have second opinion at [facility] on ***.    ***Make sure she had her SRS  Diarrhea ***  The patient was advised to call immediately if she has any concerning symptoms in the interval. The patient voices understanding of  current disease status and treatment options and is in agreement with the current care plan. All questions were answered. The patient knows to call the clinic with any problems, questions or concerns. We can certainly see the patient much sooner if necessary    No orders of the defined types were placed in this encounter.    I spent {CHL ONC TIME VISIT - NGEXB:[id number]} counseling the patient face to face. The total time spent in the appointment was {CHL ONC TIME VISIT - UUVOZ:[id number]}.  Babe Clenney L Amberlin Utke, PA-C 02/12/23

## 2023-02-13 ENCOUNTER — Ambulatory Visit: Payer: Medicare HMO

## 2023-02-13 ENCOUNTER — Ambulatory Visit (HOSPITAL_COMMUNITY): Payer: Medicare HMO

## 2023-02-15 ENCOUNTER — Encounter: Payer: Self-pay | Admitting: Emergency Medicine

## 2023-02-15 ENCOUNTER — Inpatient Hospital Stay: Payer: Medicare HMO

## 2023-02-15 ENCOUNTER — Ambulatory Visit (INDEPENDENT_AMBULATORY_CARE_PROVIDER_SITE_OTHER): Payer: Medicare HMO | Admitting: Emergency Medicine

## 2023-02-15 ENCOUNTER — Ambulatory Visit: Admission: RE | Admit: 2023-02-15 | Payer: Medicare HMO | Source: Ambulatory Visit | Admitting: Radiation Oncology

## 2023-02-15 ENCOUNTER — Ambulatory Visit: Payer: Self-pay | Admitting: Emergency Medicine

## 2023-02-15 ENCOUNTER — Ambulatory Visit: Payer: Medicare HMO

## 2023-02-15 ENCOUNTER — Inpatient Hospital Stay: Payer: Medicare HMO | Admitting: Physician Assistant

## 2023-02-15 ENCOUNTER — Other Ambulatory Visit: Payer: Medicare HMO

## 2023-02-15 VITALS — BP 132/54 | HR 110 | Temp 98.5°F | Ht 69.0 in | Wt 161.0 lb

## 2023-02-15 DIAGNOSIS — C3412 Malignant neoplasm of upper lobe, left bronchus or lung: Secondary | ICD-10-CM

## 2023-02-15 DIAGNOSIS — I10 Essential (primary) hypertension: Secondary | ICD-10-CM

## 2023-02-15 DIAGNOSIS — F418 Other specified anxiety disorders: Secondary | ICD-10-CM

## 2023-02-15 DIAGNOSIS — E785 Hyperlipidemia, unspecified: Secondary | ICD-10-CM

## 2023-02-15 DIAGNOSIS — C180 Malignant neoplasm of cecum: Secondary | ICD-10-CM | POA: Diagnosis not present

## 2023-02-15 DIAGNOSIS — K521 Toxic gastroenteritis and colitis: Secondary | ICD-10-CM | POA: Insufficient documentation

## 2023-02-15 DIAGNOSIS — T360X5A Adverse effect of penicillins, initial encounter: Secondary | ICD-10-CM | POA: Diagnosis not present

## 2023-02-15 DIAGNOSIS — E1169 Type 2 diabetes mellitus with other specified complication: Secondary | ICD-10-CM

## 2023-02-15 DIAGNOSIS — Z7984 Long term (current) use of oral hypoglycemic drugs: Secondary | ICD-10-CM | POA: Diagnosis not present

## 2023-02-15 DIAGNOSIS — R197 Diarrhea, unspecified: Secondary | ICD-10-CM | POA: Diagnosis not present

## 2023-02-15 NOTE — Assessment & Plan Note (Signed)
-  This was discovered during her staging CT scan for newly diagnosed colon cancer.  It showed a 4.9 x 2.8 cm subpleural mass in the left upper lobe, and a new supraclavicular and mediastinal adenopathy -Biopsy of the left supraclavicular lymph node showed high-grade neuroendocrine tumor, favor small cell cancer, TTF-1 positive, consistent with lung primary. -PET scan showed hypermetabolic mediastinal and left supraclavicular nodal metastasis in addition to the large left upper lobe primary tumor invading the chest wall.  No other distant metastasis on the PET scan. -Unfortunately her brain MRI showed a 4 mm lesion in the right periatrial lesion, highly concerning for metastasis. -We discussed the chance of cure is minimal due to her significant local regional nodal metastasis and a small brain metastasis, however given her very limited brain metastasis, I recommend concurrent chemoradiation to her thorax, and brain radiation.  -I recommend chemotherapy with carboplatin, etoposide and atezolizumab every 3 weeks for 4 cycles, followed by maintenance atezolizumab. Started chemotherapy and radiation.  Tolerating treatment well.

## 2023-02-15 NOTE — Assessment & Plan Note (Signed)
Chronic but stable Continues BuSpar 7.5 mg twice a day and Lexapro 10 mg daily Uses alprazolam as needed

## 2023-02-15 NOTE — Assessment & Plan Note (Signed)
Most likely related to recent Augmentin use. Afebrile.  Benign physical examination. Tolerating fluids and certain solids well. No signs of C. difficile diarrhea Advised to stay well-hydrated and stick to [employer] May continue using Imodium as needed

## 2023-02-15 NOTE — Assessment & Plan Note (Signed)
Well-controlled hyper tension Continue valsartan HCT 160-12.5 mg daily Also on metoprolol succinate 25 mg daily   BP Readings from Last 3 Encounters:  02/15/23 (!) 132/54  02/04/23 (!) 142/58  02/04/23 (!) 145/45

## 2023-02-15 NOTE — Telephone Encounter (Signed)
Copied from CRM [id number]. Topic: Clinical - Red Word Triage >> Feb 15, 2023  8:13 AM Almira Coaster wrote: Red Word that prompted transfer to Nurse Triage: Patient is calling with symptoms of Nausea & mucus diarrhea, she's been experiencing this since last week, got better over the weekend but today at 3am all symptoms came back.  Chief Complaint: diarrhea Symptoms: watery-mucous diarrhea, abd discomfort, nausea Frequency: last week stopped and started back today -  Pertinent Negatives: Patient denies vomiting Disposition: [] ED /[] Urgent Care (no appt availability in office) / [x] Appointment(In office/virtual)/ []  [facility]/ [] Home Care/ [] Refused Recommended Disposition /[] [facility]/ []  Follow-up with PCP Additional Notes: PCP informed pt if this returned/continued after Abx use to make another appointment to be seen  Reason for Disposition  [1] MODERATE diarrhea (e.g., 4-6 times / day more than normal) AND [2] age > 70 years  Answer Assessment - Initial Assessment Questions 1. DIARRHEA SEVERITY: "How bad is the diarrhea?" "How many more stools have you had in the past 24 hours than normal?"    - NO DIARRHEA (SCALE 0)   - MILD (SCALE 1-3): Few loose or mushy BMs; increase of 1-3 stools over normal daily number of stools; mild increase in ostomy output.   -  MODERATE (SCALE 4-7): Increase of 4-6 stools daily over normal; moderate increase in ostomy output.   -  SEVERE (SCALE 8-10; OR "WORST POSSIBLE"): Increase of 7 or more stools daily over normal; moderate increase in ostomy output; incontinence.     Mild 2. ONSET: "When did the diarrhea begin?"      today 3. BM CONSISTENCY: "How loose or watery is the diarrhea?"      Mucous/watery 4. VOMITING: "Are you also vomiting?" If Yes, ask: "How many times in the past 24 hours?"      no 5. ABDOMEN PAIN: "Are you having any abdomen pain?" If Yes, ask: "What does it feel like?" (e.g., crampy, dull, intermittent, constant)       1/10 abd pain 6. ABDOMEN PAIN SEVERITY: If present, ask: "How bad is the pain?"  (e.g., Scale 1-10; mild, moderate, or severe)   - MILD (1-3): doesn't interfere with normal activities, abdomen soft and not tender to touch    - MODERATE (4-7): interferes with normal activities or awakens from sleep, abdomen tender to touch    - SEVERE (8-10): excruciating pain, doubled over, unable to do any normal activities       mild 7. ORAL INTAKE: If vomiting, "Have you been able to drink liquids?" "How much liquids have you had in the past 24 hours?"     N/a 8. HYDRATION: "Any signs of dehydration?" (e.g., dry mouth [not just dry lips], too weak to stand, dizziness, new weight loss) "When did you last urinate?"     N/a 9. EXPOSURE: "Have you traveled to a foreign country recently?" "Have you been exposed to anyone with diarrhea?" "Could you have eaten any food that was spoiled?"     N/a 10. ANTIBIOTIC USE: "Are you taking antibiotics now or have you taken antibiotics in the past 2 months?"       Yes  -Augmentin 11. OTHER SYMPTOMS: "Do you have any other symptoms?" (e.g., fever, blood in stool)       Mucus diarrhea 12. PREGNANCY: "Is there any chance you are pregnant?" "When was your last menstrual period?"       N/a  Protocols used: [facility]

## 2023-02-15 NOTE — Assessment & Plan Note (Signed)
 Chronic stable conditions Continue metformin 1000 mg twice a day and atorvastatin 20 mg daily

## 2023-02-15 NOTE — Assessment & Plan Note (Signed)
Treatment on hold given metastatic lung cancer diagnosis and need for treatment

## 2023-02-15 NOTE — Patient Instructions (Signed)
Diarrhea, Adult Diarrhea is when you pass loose and sometimes watery poop (stool) often. Diarrhea can make you feel weak and cause you to lose water in your body (get dehydrated). Losing water in your body can cause you to: Feel tired and thirsty. Have a dry mouth. Go pee (urinate) less often. Diarrhea often lasts 2-3 days. It can last longer if it is a sign of something more serious. Be sure to treat your diarrhea as told by your doctor. Follow these instructions at home: Eating and drinking     Follow these instructions as told by your doctor: Take an ORS (oral rehydration solution). This is a drink that helps you replace fluids and minerals your body lost. It is sold at pharmacies and stores. Drink enough fluid to keep your pee (urine) pale yellow. Drink fluids such as: Water. You can also get fluids by sucking on ice chips. Diluted fruit juice. Low-calorie sports drinks. Milk. Avoid drinking fluids that have a lot of sugar or caffeine in them. These include soda, energy drinks, and regular sports drinks. Avoid alcohol. Eat bland, easy-to-digest foods in small amounts as you are able. These foods include: Bananas. Applesauce. Rice. Low-fat (lean) meats. Toast. Crackers. Avoid spicy or fatty foods.  Medicines Take over-the-counter and prescription medicines only as told by your doctor. If you were prescribed antibiotics, take them as told by your doctor. Do not stop taking them even if you start to feel better. General instructions  Wash your hands often using soap and water for 20 seconds. If soap and water are not available, use hand sanitizer. Others in your home should wash their hands as well. Wash your hands: After using the toilet or changing a diaper. Before preparing, cooking, or serving food. While caring for a sick person. While visiting someone in a hospital. Rest at home while you get better. Take a warm bath to help with any burning or pain from having  diarrhea. Watch your condition for any changes. Contact a doctor if: You have a fever. Your diarrhea gets worse. You have new symptoms. You vomit every time you eat or drink. You feel light-headed, dizzy, or you have a headache. You have muscle cramps. You have signs of losing too much water in your body, such as: Dark pee, very little pee, or no pee. Cracked lips. Dry mouth. Sunken eyes. Sleepiness. Weakness. You have bloody or black poop or poop that looks like tar. You have very bad pain, cramping, or bloating in your belly (abdomen). Your skin feels cold and clammy. You feel confused. Get help right away if: You have chest pain. Your heart is beating very quickly. You have trouble breathing or you are breathing very quickly. You feel very weak or you faint. These symptoms may be an emergency. Get help right away. Call 911. Do not wait to see if the symptoms will go away. Do not drive yourself to the hospital. This information is not intended to replace advice given to you by your health care provider. Make sure you discuss any questions you have with your health care provider. Document Revised: 07/01/2021 Document Reviewed: 07/01/2021 Elsevier Patient Education  2024 [employer].

## 2023-02-15 NOTE — Progress Notes (Signed)
Jasmine Long 73 y.o.   Chief Complaint  Patient presents with   Diarrhea    Patient has been experiencing diarrhea since last OV. Having nausea, she states imodium did not help. Pt needing more Xanax last refill was only given 10 tablet     HISTORY OF PRESENT ILLNESS: This is a 73 y.o. female complaining of watery diarrhea for the last several days.  Started after last office visit when she was found to have E. coli UTI and was started on Augmentin.  Finished Augmentin about 3 days ago.  Has occasional nausea but no vomiting.  Noticed a streak of blood yesterday in the stools. Able to eat and drink.  Denies fever or chills.  Denies abdominal pain or any other associated symptoms. No other complaints or medical concerns today Presently getting chemotherapy and radiation for metastatic lung cancer. Refill of Xanax was sent on 02/09/2023.  Not picked up yet.  Diarrhea  Pertinent negatives include no abdominal pain, chills, coughing, fever, headaches or vomiting.     Prior to Admission medications   Medication Sig Start Date End Date Taking? Authorizing Provider  ALPRAZolam Prudy Feeler) 0.5 MG tablet Take 1 tablet (0.5 mg total) by mouth 2 (two) times daily as needed for anxiety. 02/09/23  Yes Termaine Roupp, Eilleen Kempf, MD  atorvastatin (LIPITOR) 20 MG tablet Take 1 tablet by mouth once daily 08/18/22  Yes Ky Moskowitz, Eilleen Kempf, MD  buPROPion (WELLBUTRIN XL) 300 MG 24 hr tablet Take 1 tablet by mouth once daily 08/18/22  Yes Zivah Mayr, Eilleen Kempf, MD  busPIRone (BUSPAR) 7.5 MG tablet Take 1 tablet (7.5 mg total) by mouth 2 (two) times daily. 07/16/22  Yes Miquel Lamson, Eilleen Kempf, MD  gabapentin (NEURONTIN) 300 MG capsule Take 300 mg by mouth as needed (numbness). 08/14/22  Yes [provider]  levothyroxine (SYNTHROID) 125 MCG tablet TAKE 1 TABLET BY MOUTH ONCE DAILY BEFORE BREAKFAST 04/14/22  Yes Erin Obando, Eilleen Kempf, MD  lidocaine (LIDODERM) 5 % Place 1 patch onto the skin daily. Remove & Discard  patch within 12 hours or as directed by MD 01/12/23  Yes Loetta Rough, MD  lidocaine-prilocaine (EMLA) cream Apply 1 Application topically as needed. 02/04/23  Yes Heilingoetter, Cassandra L, PA-C  metFORMIN (GLUCOPHAGE) 1000 MG tablet TAKE 1 TABLET BY MOUTH TWICE DAILY WITH A MEAL 08/18/22  Yes Zanden Colver, Eilleen Kempf, MD  metoprolol succinate (TOPROL-XL) 25 MG 24 hr tablet Take 1 tablet by mouth once daily 05/27/22  Yes Deaven Barron, Mallard, MD  nicotine (NICODERM CQ) 21 mg/24hr patch Place 1 patch (21 mg total) onto the skin daily. 11/26/22  Yes Joandy Burget, Eilleen Kempf, MD  ondansetron (ZOFRAN) 8 MG tablet Take 1 tablet (8 mg total) by mouth every 8 (eight) hours as needed for nausea or vomiting. 01/15/23  Yes Bruning, Ashlyn, PA-C  ondansetron (ZOFRAN) 8 MG tablet Take 1 tablet (8 mg total) by mouth every 8 (eight) hours as needed for nausea or vomiting. Start on third day after chemotherapy. 01/18/23  Yes Si Gaul, MD  pantoprazole (PROTONIX) 40 MG tablet Take 1 tablet twice a day for 2 weeks and then daily after that 08/08/22  Yes Leinaala Catanese, Eilleen Kempf, MD  prochlorperazine (COMPAZINE) 10 MG tablet Take 1 tablet (10 mg total) by mouth every 6 (six) hours as needed for nausea or vomiting (Nausea or vomiting). 01/18/23  Yes Si Gaul, MD  valsartan-hydrochlorothiazide (DIOVAN-HCT) 160-12.5 MG tablet Take 1 tablet by mouth once daily 05/27/22  Yes Franny Selvage, Eilleen Kempf, MD  escitalopram (  LEXAPRO) 10 MG tablet Take 1 tablet (10 mg total) by mouth daily. 12/25/21 12/20/22  [name], MD    No Known Allergies  Patient Active Problem List   Diagnosis Date Noted   Dysuria 02/06/2023   Screening for colon cancer 12/09/2022   Malignant neoplasm of ascending colon (HCC) 12/09/2022   Small cell lung cancer (HCC) 12/09/2022   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   Weight loss 05/13/2022   Change in multiple pigmented skin lesions 04/22/2022   Paresthesia of both lower  extremities 04/22/2022   Abnormal MRI, lumbar spine 04/22/2022   Vitreous floaters of right eye 04/07/2022   Hematoma of scalp 12/25/2021   Smokers' cough (HCC) 12/26/2020   Chronic bilateral low back pain with left-sided sciatica 12/26/2020   Chronic sinus complaints 12/26/2020   Chronic anxiety 06/28/2019   History of peptic ulcer 04/30/2017   Hyperlipidemia 12/11/2015   Insomnia 12/11/2015   Panic attack as reaction to stress 12/11/2015   Current smoker 12/11/2015   Chronic sinusitis 12/11/2015   Dyslipidemia associated with type 2 diabetes mellitus (HCC) 05/15/2015   Hypothyroid 04/29/2011   BMI 40.0-44.9, adult (HCC) 04/29/2011   Depression with anxiety 04/29/2011   GERD (gastroesophageal reflux disease) 04/29/2011   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   Nicotine addiction 04/29/2011   Essential hypertension 04/29/2011   Heart murmur 04/29/2011    Past Medical History:  Diagnosis Date   Allergy    Anxiety    Depression    Diabetes mellitus without complication (HCC)    Heart murmur    Hypertension    Thyroid disease    Ulcer     Past Surgical History:  Procedure Laterality Date   BREAST SURGERY     CESAREAN SECTION     CHOLECYSTECTOMY     COLONOSCOPY WITH PROPOFOL N/A 10/30/2022   Procedure: COLONOSCOPY WITH PROPOFOL;  Surgeon: Jeani Hawking, MD;  Location: [facility];  Service: Gastroenterology;  Laterality: N/A;   ENDOSCOPIC MUCOSAL RESECTION  10/30/2022   Procedure: ENDOSCOPIC MUCOSAL RESECTION;  Surgeon: Jeani Hawking, MD;  Location: [facility];  Service: Gastroenterology;;   HEMOSTASIS CLIP PLACEMENT  10/30/2022   Procedure: HEMOSTASIS CLIP PLACEMENT;  Surgeon: Jeani Hawking, MD;  Location: [facility];  Service: Gastroenterology;;   NASAL SEPTUM SURGERY     POLYPECTOMY  10/30/2022   Procedure: POLYPECTOMY;  Surgeon: Jeani Hawking, MD;  Location: [facility];  Service: Gastroenterology;;   Sunnie Nielsen LIFTING INJECTION  10/30/2022   Procedure: SUBMUCOSAL  LIFTING INJECTION;  Surgeon: Jeani Hawking, MD;  Location: [facility];  Service: Gastroenterology;;    Social History   Socioeconomic History   Marital status: Divorced    Spouse name: Not on file   Number of children: 2   Years of education: Not on file   Highest education level: Bachelor's degree (e.g., BA, AB, BS)  Occupational History   Occupation: Unemployed  Tobacco Use   Smoking status: Every Day    Current packs/day: 0.00    Average packs/day: 1.5 packs/day for 40.0 years (60.0 ttl pk-yrs)    Types: Cigarettes    Start date: 01/20/1972    Last attempt to quit: 01/20/2012    Years since quitting: 11.0   Smokeless tobacco: Never   Tobacco comments:    Pt doing E-cigarette as of 01/20/12  Vaping Use   Vaping status: Former  Substance and Sexual Activity   Alcohol use: No   Drug use: No   Sexual activity: Never  Other Topics Concern   Not  on file  Social History Narrative   Patient on worker's compensation for an injury sustained on the job 5 years ago.      Right handed and Left Handed       Lives in a two story home       One son deceased.    Social Drivers of Corporate investment banker Strain: Low Risk  (01/09/2023)   Overall Financial Resource Strain (CARDIA)    Difficulty of Paying Living Expenses: Not hard at all  Food Insecurity: No Food Insecurity (01/12/2023)   Hunger Vital Sign    Worried About Running Out of Food in the Last Year: Never true    Ran Out of Food in the Last Year: Never true  Transportation Needs: No Transportation Needs (01/12/2023)   PRAPARE - Administrator, Civil Service (Medical): No    Lack of Transportation (Non-Medical): No  Physical Activity: Inactive (01/09/2023)   Exercise Vital Sign    Days of Exercise per Week: 0 days    Minutes of Exercise per Session: 0 min  Stress: Stress Concern Present (01/09/2023)   [employer] of Occupational Health - Occupational Stress Questionnaire    Feeling of Stress  : Very much  Social Connections: Socially Isolated (01/09/2023)   Social Connection and Isolation Panel [NHANES]    Frequency of Communication with Friends and Family: Twice a week    Frequency of Social Gatherings with Friends and Family: Never    Attends Religious Services: Never    Database administrator or Organizations: No    Attends Banker Meetings: Never    Marital Status: Divorced  Catering manager Violence: Not At Risk (01/12/2023)   Humiliation, Afraid, Rape, and Kick questionnaire    Fear of Current or Ex-Partner: No    Emotionally Abused: No    Physically Abused: No    Sexually Abused: No    Family History  Problem Relation Age of Onset   Lung cancer Mother 20   Dementia Father    Atrial fibrillation Father    Cancer Paternal Aunt        breast cancer   Colon cancer Paternal Aunt    Colon cancer Cousin      Review of Systems  Constitutional: Negative.  Negative for chills and fever.  HENT: Negative.  Negative for congestion and sore throat.   Respiratory: Negative.  Negative for cough and shortness of breath.   Gastrointestinal:  Positive for diarrhea. Negative for abdominal pain, nausea and vomiting.  Genitourinary: Negative.  Negative for dysuria and hematuria.  Skin: Negative.  Negative for rash.  Neurological: Negative.  Negative for dizziness and headaches.  All other systems reviewed and are negative.   Vitals:   02/15/23 1523  BP: (!) 132/54  Pulse: (!) 110  Temp: 98.5 F (36.9 C)  SpO2: 98%    Physical Exam Vitals reviewed.  Constitutional:      Appearance: Normal appearance.  HENT:     Head: Normocephalic.     Mouth/Throat:     Mouth: Mucous membranes are moist.     Pharynx: Oropharynx is clear.  Eyes:     Extraocular Movements: Extraocular movements intact.  Cardiovascular:     Rate and Rhythm: Normal rate and regular rhythm.     Pulses: Normal pulses.     Heart sounds: Normal heart sounds.  Pulmonary:     Effort:  Pulmonary effort is normal.     Breath sounds: Normal breath sounds.  Abdominal:     Palpations: Abdomen is soft.     Tenderness: There is no abdominal tenderness.  Musculoskeletal:     Cervical back: No tenderness.  Lymphadenopathy:     Cervical: No cervical adenopathy.  Skin:    General: Skin is warm and dry.  Neurological:     Mental Status: She is alert and oriented to person, place, and time.  Psychiatric:        Mood and Affect: Mood normal.        Behavior: Behavior normal.      ASSESSMENT & PLAN: A total of 44 minutes was spent with the patient and counseling/coordination of care regarding preparing for this visit, review of most recent office visit notes, review of multiple chronic medical conditions and their management, treatment of acute diarrhea, review of all medications, review of most recent bloodwork results, review of health maintenance items, education on nutrition, prognosis, documentation, and need for follow up.   Problem List Items Addressed This Visit       Cardiovascular and Mediastinum   Essential hypertension   Well-controlled hyper tension Continue valsartan HCT 160-12.5 mg daily Also on metoprolol succinate 25 mg daily   BP Readings from Last 3 Encounters:  02/15/23 (!) 132/54  02/04/23 (!) 142/58  02/04/23 (!) 145/45           Respiratory   Small cell lung cancer (HCC)   -This was discovered during her staging CT scan for newly diagnosed colon cancer.  It showed a 4.9 x 2.8 cm subpleural mass in the left upper lobe, and a new supraclavicular and mediastinal adenopathy -Biopsy of the left supraclavicular lymph node showed high-grade neuroendocrine tumor, favor small cell cancer, TTF-1 positive, consistent with lung primary. -PET scan showed hypermetabolic mediastinal and left supraclavicular nodal metastasis in addition to the large left upper lobe primary tumor invading the chest wall.  No other distant metastasis on the PET  scan. -Unfortunately her brain MRI showed a 4 mm lesion in the right periatrial lesion, highly concerning for metastasis. -We discussed the chance of cure is minimal due to her significant local regional nodal metastasis and a small brain metastasis, however given her very limited brain metastasis, I recommend concurrent chemoradiation to her thorax, and brain radiation.  -I recommend chemotherapy with carboplatin, etoposide and atezolizumab every 3 weeks for 4 cycles, followed by maintenance atezolizumab. Started chemotherapy and radiation.  Tolerating treatment well.        Digestive   Adenocarcinoma of cecum (HCC)   Treatment on hold given metastatic lung cancer diagnosis and need for treatment       Diarrhea due to drug - Primary   Most likely related to recent Augmentin use. Afebrile.  Benign physical examination. Tolerating fluids and certain solids well. No signs of C. difficile diarrhea Advised to stay well-hydrated and stick to [employer] May continue using Imodium as needed        Endocrine   Dyslipidemia associated with type 2 diabetes mellitus (HCC)   Chronic stable conditions Continue metformin 1000 mg twice a day and atorvastatin 20 mg daily          Other   Depression with anxiety   Chronic but stable Continues BuSpar 7.5 mg twice a day and Lexapro 10 mg daily Uses alprazolam as needed      Patient Instructions  Diarrhea, Adult Diarrhea is when you pass loose and sometimes watery poop (stool) often. Diarrhea can make you feel weak and cause you to  lose water in your body (get dehydrated). Losing water in your body can cause you to: Feel tired and thirsty. Have a dry mouth. Go pee (urinate) less often. Diarrhea often lasts 2-3 days. It can last longer if it is a sign of something more serious. Be sure to treat your diarrhea as told by your doctor. Follow these instructions at home: Eating and drinking     Follow these instructions as told by your  doctor: Take an ORS (oral rehydration solution). This is a drink that helps you replace fluids and minerals your body lost. It is sold at pharmacies and stores. Drink enough fluid to keep your pee (urine) pale yellow. Drink fluids such as: Water. You can also get fluids by sucking on ice chips. Diluted fruit juice. Low-calorie sports drinks. Milk. Avoid drinking fluids that have a lot of sugar or caffeine in them. These include soda, energy drinks, and regular sports drinks. Avoid alcohol. Eat bland, easy-to-digest foods in small amounts as you are able. These foods include: Bananas. Applesauce. Rice. Low-fat (lean) meats. Toast. Crackers. Avoid spicy or fatty foods.  Medicines Take over-the-counter and prescription medicines only as told by your doctor. If you were prescribed antibiotics, take them as told by your doctor. Do not stop taking them even if you start to feel better. General instructions  Wash your hands often using soap and water for 20 seconds. If soap and water are not available, use hand sanitizer. Others in your home should wash their hands as well. Wash your hands: After using the toilet or changing a diaper. Before preparing, cooking, or serving food. While caring for a sick person. While visiting someone in a hospital. Rest at home while you get better. Take a warm bath to help with any burning or pain from having diarrhea. Watch your condition for any changes. Contact a doctor if: You have a fever. Your diarrhea gets worse. You have new symptoms. You vomit every time you eat or drink. You feel light-headed, dizzy, or you have a headache. You have muscle cramps. You have signs of losing too much water in your body, such as: Dark pee, very little pee, or no pee. Cracked lips. Dry mouth. Sunken eyes. Sleepiness. Weakness. You have bloody or black poop or poop that looks like tar. You have very bad pain, cramping, or bloating in your belly  (abdomen). Your skin feels cold and clammy. You feel confused. Get help right away if: You have chest pain. Your heart is beating very quickly. You have trouble breathing or you are breathing very quickly. You feel very weak or you faint. These symptoms may be an emergency. Get help right away. Call 911. Do not wait to see if the symptoms will go away. Do not drive yourself to the hospital. This information is not intended to replace advice given to you by your health care provider. Make sure you discuss any questions you have with your health care provider. Document Revised: 07/01/2021 Document Reviewed: 07/01/2021 Elsevier Patient Education  2024 Else[employer]    Edwina Barth, MD [facility]

## 2023-02-16 ENCOUNTER — Ambulatory Visit: Payer: Medicare HMO | Admitting: Radiation Oncology

## 2023-02-16 ENCOUNTER — Inpatient Hospital Stay: Payer: Medicare HMO

## 2023-02-16 ENCOUNTER — Ambulatory Visit: Payer: Medicare HMO

## 2023-02-16 DIAGNOSIS — C3412 Malignant neoplasm of upper lobe, left bronchus or lung: Secondary | ICD-10-CM | POA: Diagnosis not present

## 2023-02-16 DIAGNOSIS — C770 Secondary and unspecified malignant neoplasm of lymph nodes of head, face and neck: Secondary | ICD-10-CM | POA: Diagnosis not present

## 2023-02-16 DIAGNOSIS — D122 Benign neoplasm of ascending colon: Secondary | ICD-10-CM | POA: Diagnosis not present

## 2023-02-16 DIAGNOSIS — C180 Malignant neoplasm of cecum: Secondary | ICD-10-CM | POA: Diagnosis not present

## 2023-02-16 MED FILL — Fosaprepitant Dimeglumine For IV Infusion 150 MG (Base Eq): INTRAVENOUS | Qty: 5 | Status: AC

## 2023-02-17 ENCOUNTER — Ambulatory Visit: Payer: Medicare HMO

## 2023-02-17 ENCOUNTER — Inpatient Hospital Stay: Payer: Medicare HMO

## 2023-02-17 ENCOUNTER — Telehealth: Payer: Self-pay

## 2023-02-17 ENCOUNTER — Inpatient Hospital Stay: Payer: Medicare HMO | Admitting: Internal Medicine

## 2023-02-17 ENCOUNTER — Ambulatory Visit: Payer: Medicare HMO | Admitting: Radiation Oncology

## 2023-02-17 ENCOUNTER — Other Ambulatory Visit: Payer: Self-pay | Admitting: Physician Assistant

## 2023-02-17 DIAGNOSIS — C3412 Malignant neoplasm of upper lobe, left bronchus or lung: Secondary | ICD-10-CM

## 2023-02-17 NOTE — Progress Notes (Signed)
[facility] OFFICE PROGRESS NOTE  [name], MD [address] 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   PRIOR THERAPY: None  CURRENT THERAPY:  1) of systemic chemotherapy with carboplatin for an AUC 5 and etoposide 100 mg/m on days 1, 2, and 3 IV every 3 weeks.  Imfinzi will be added once she completes palliative radiation. 2) Palliative radiation to the lung under the care of Dr. Kathrynn Running.  3) scheduled for SRS to the brain in the near future.   INTERVAL HISTORY: Jasmine Long 73 y.o. female returns to the clinic today for a follow-up visit.  The patient was recently diagnosed with extensive stage small cell lung cancer.  She is currently undergoing palliative radiation to the lung. She does have metastatic disease to the brain and had her SRS simulation earlier today under the care of Dr. Kathrynn Running. The patient is interested in a second opinion and she is expected to see [facility] in the future but 02/26/23.  She tolerated her first cycle of treatment well without any concerning adverse side effects but has concerns with recurrent sinus infections and urinary tract infections. She saw her PCP who prescribed Augmentin for positive urinalysis. The UTI resolved. She states she has seen ENTs since she was a child and they told her previously that there is nothing else they are able to do regarding her recurrent sinus infections. She states she has had procedures involving balloons, deviated septum procedures, etc. She states she has  learned to live with it. She uses saline spray.   Otherwise she is feeling fairly well today.  She is starting to lose her hair.  Her Port-A-Cath procedure had been rescheduled for 03/05/2023.  Dr. Arbutus Ped is planning on adding immunotherapy once she completes palliative radiation to the lung.   Today she denies any fever, chills, or night sweats. She denies any significant dyspnea on exertion.  She states her breathing is "fine".  She reports some cough secondary to postnasal drainage.  Denies any hemoptysis.  No longer having significant back pain and no longer is using Salonpas patches.  She denies any headache or visual changes.  She reports her appetite is good. He is here today for evaluation repeat blood work before undergoing cycle #2.  MEDICAL HISTORY: Past Medical History:  Diagnosis Date   Allergy    Anxiety    Depression    Diabetes mellitus without complication (HCC)    Heart murmur    Hypertension    Thyroid disease    Ulcer     ALLERGIES:  has no known allergies.  MEDICATIONS:  Current Outpatient Medications  Medication Sig Dispense Refill   ALPRAZolam (XANAX) 0.5 MG tablet Take 1 tablet (0.5 mg total) by mouth 2 (two) times daily as needed for anxiety. 30 tablet 3   atorvastatin (LIPITOR) 20 MG tablet Take 1 tablet by mouth once daily 90 tablet 1   buPROPion (WELLBUTRIN XL) 300 MG 24 hr tablet Take 1 tablet by mouth once daily 90 tablet 1   busPIRone (BUSPAR) 7.5 MG tablet Take 1 tablet (  7.5 mg total) by mouth 2 (two) times daily. 60 tablet 1   escitalopram (LEXAPRO) 10 MG tablet Take 1 tablet (10 mg total) by mouth daily. 90 tablet 3   gabapentin (NEURONTIN) 300 MG capsule Take 300 mg by mouth as needed (numbness).     levothyroxine (SYNTHROID) 125 MCG tablet TAKE 1 TABLET BY MOUTH ONCE DAILY BEFORE BREAKFAST 90 tablet 0   lidocaine (LIDODERM) 5 % Place 1 patch onto the skin daily. Remove & Discard patch within 12 hours or as directed by MD 30 patch 0    lidocaine-prilocaine (EMLA) cream Apply 1 Application topically as needed. 30 g 2   metFORMIN (GLUCOPHAGE) 1000 MG tablet TAKE 1 TABLET BY MOUTH TWICE DAILY WITH A MEAL 180 tablet 1   metoprolol succinate (TOPROL-XL) 25 MG 24 hr tablet Take 1 tablet by mouth once daily 90 tablet 3   nicotine (NICODERM CQ) 21 mg/24hr patch Place 1 patch (21 mg total) onto the skin daily. 28 patch 3   ondansetron (ZOFRAN) 8 MG tablet Take 1 tablet (8 mg total) by mouth every 8 (eight) hours as needed for nausea or vomiting. 30 tablet 1   ondansetron (ZOFRAN) 8 MG tablet Take 1 tablet (8 mg total) by mouth every 8 (eight) hours as needed for nausea or vomiting. Start on third day after chemotherapy. 30 tablet 1   pantoprazole (PROTONIX) 40 MG tablet Take 1 tablet twice a day for 2 weeks and then daily after that 90 tablet 3   prochlorperazine (COMPAZINE) 10 MG tablet Take 1 tablet (10 mg total) by mouth every 6 (six) hours as needed for nausea or vomiting (Nausea or vomiting). 30 tablet 1   valsartan-hydrochlorothiazide (DIOVAN-HCT) 160-12.5 MG tablet Take 1 tablet by mouth once daily 90 tablet 3   No current facility-administered medications for this visit.    SURGICAL HISTORY:  Past Surgical History:  Procedure Laterality Date   BREAST SURGERY     CESAREAN SECTION     CHOLECYSTECTOMY     COLONOSCOPY WITH PROPOFOL N/A 10/30/2022   Procedure: COLONOSCOPY WITH PROPOFOL;  Surgeon: Jeani Hawking, MD;  Location: [facility];  Service: Gastroenterology;  Laterality: N/A;   ENDOSCOPIC MUCOSAL RESECTION  10/30/2022   Procedure: ENDOSCOPIC MUCOSAL RESECTION;  Surgeon: Jeani Hawking, MD;  Location: [facility];  Service: Gastroenterology;;   HEMOSTASIS CLIP PLACEMENT  10/30/2022   Procedure: HEMOSTASIS CLIP PLACEMENT;  Surgeon: Jeani Hawking, MD;  Location: [facility];  Service: Gastroenterology;;   NASAL SEPTUM SURGERY     POLYPECTOMY  10/30/2022   Procedure: POLYPECTOMY;  Surgeon: Jeani Hawking, MD;  Location: [facility];  Service: Gastroenterology;;   Sunnie Nielsen LIFTING INJECTION  10/30/2022   Procedure: SUBMUCOSAL LIFTING INJECTION;  Surgeon: Jeani Hawking, MD;  Location: [facility];  Service: Gastroenterology;;    REVIEW OF SYSTEMS:   Constitutional: Positive for stable fatigue. Negative for appetite change, chills, fatigue, fever and unexpected weight change.  HENT: Positive for nasal congestion. Negative for mouth sores, nosebleeds, sore throat and trouble swallowing.   Eyes: Negative for eye problems and icterus.  Respiratory: Positive for cough from sinus drainage. Negative for hemoptysis, shortness of breath and wheezing.   Cardiovascular: Negative for chest pain and leg swelling.  Gastrointestinal: Negative for diarrhea (resolved), diarrhea, nausea and vomiting.  Genitourinary: Negative for bladder incontinence, difficulty urinating,  frequency and hematuria.   Musculoskeletal: Negative for gait problem, neck pain and neck stiffness.  Skin: Negative for itching and rash.  Neurological: Negative for dizziness, extremity weakness, gait  problem, headaches, light-headedness and seizures.  Hematological: Negative for adenopathy. Does not bruise/bleed easily.  Psychiatric/Behavioral: Negative for confusion, depression and sleep disturbance. The patient is not nervous/anxious.     PHYSICAL EXAMINATION:  There were no vitals taken for this visit.  ECOG PERFORMANCE STATUS: 2  Physical Exam  Constitutional: Oriented to person, place, and time and chronically ill appearing female, and in no distress.  HENT:  Head: Normocephalic and atraumatic.  Mouth/Throat: Oropharynx is clear and moist. No oropharyngeal exudate.  Eyes: Conjunctivae are normal. Right eye exhibits no discharge. Left eye exhibits no discharge. No scleral icterus.  Neck: Normal range of motion. Neck supple.  Cardiovascular: Normal rate, regular rhythm, murmur noted and intact distal pulses.   Pulmonary/Chest: Effort normal and  breath sounds normal. No respiratory distress. No wheezes. No rales.  Abdominal: Soft. Bowel sounds are normal. Exhibits no distension and no mass. There is no tenderness.  Musculoskeletal: Examined in chair. Exhibits no edema.  Lymphadenopathy:    No cervical adenopathy.  Neurological: Alert and oriented to person, place, and time. Exhibits muscle wasting. Examined in the wheelchair.  Skin: Skin is warm and dry. No rash noted. Not diaphoretic. No erythema. No pallor.  Psychiatric: Mood, memory and judgment normal.  Vitals reviewed.  LABORATORY DATA: Lab Results  Component Value Date   WBC 2.4 (L) 02/04/2023   HGB 11.8 (L) 02/04/2023   HCT 35.6 (L) 02/04/2023   MCV 88.3 02/04/2023   PLT 178 02/04/2023      Chemistry      Component Value Date/Time   NA 137 02/04/2023 0844   NA 134 09/11/2022 1259   K 4.3 02/04/2023 0844   CL 105 02/04/2023 0844   CO2 25 02/04/2023 0844   BUN 18 02/04/2023 0844   BUN 12 09/11/2022 1259   CREATININE 0.84 02/04/2023 0844   CREATININE 0.98 10/30/2013 1118      Component Value Date/Time   CALCIUM 9.6 02/04/2023 0844   ALKPHOS 95 02/04/2023 0844   AST 8 (L) 02/04/2023 0844   ALT <5 02/04/2023 0844   BILITOT 0.5 02/04/2023 0844       RADIOGRAPHIC STUDIES:  No results found.   ASSESSMENT/PLAN:  This is a very pleasant 73 year old Caucasian female with extensive stage (T2b, N3, M1 B) small cell lung cancer.  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  She was diagnosed in December 2024 -The patient also has early stage IV colon adenocarcinoma that was diagnosed on colonoscopy but surgical resection is currently on hold until management of her small cell lung cancer.   She is currently undergoing palliative radiation to the lung and expected to undergo SRS to the metastatic brain lesion on 02/18/2023  under the care of Dr. Kathrynn Running.   She is currently undergoing palliative systemic chemotherapy with carboplatin for an AUC of 5 on day 1, etoposide 100 mg/m on days 1, 2, and 3 IV every 3 weeks.  Dr. Arbutus Ped will add Imfinzi 1500 mg on day 1 once she completes her palliative radiation.   Labs were reviewed.  She will proceed with cycle #2 today as scheduled.     She is expected to have a Port-A-Cath placed on 03/05/23.     She will continue to follow with Dr. Kathrynn Running for palliative radiation to the lung.  She is followed by Dr. Dutch Quint from neurosurgery regarding her T12  fracture.   Will see her back for follow-up visit in 3 weeks before undergoing cycle #3.  I will arrange for restaging CT scan of the chest, abdomen, and pelvis prior to starting her next cycle of treatment.   Adenocarcinoma of the colon diagnosed in July, polyp removed in October, followed by colectomy. No evidence of metastasis on recent scans. - Continue monitoring for recurrence or metastasis   She is expected to have second opinion at [facility] on 02/26/23   The patient was advised to call immediately if she has any concerning symptoms in the interval. The patient voices understanding of current disease status and treatment options and is in agreement with the current care plan. All questions were answered. The patient knows to call the clinic with any problems, questions or concerns. We can certainly see the patient much sooner if necessary   No orders of the defined types were placed in this encounter.    The total time spent in the appointment was 20-29 minutes  Perl Kerney L Shelbia Scinto, PA-C 02/17/23

## 2023-02-17 NOTE — Telephone Encounter (Signed)
Spoke with patients son about missed appt for today and treatment for the next few days.  Informed son that her treatment will be deferred and someone from scheduling will call with new appts. Patients son verbalized understanding.

## 2023-02-17 NOTE — Telephone Encounter (Signed)
RN returned call to Jasmine Long called this morning stating that she will not be in for treatment today due to continued diarrhea.  She has requested a call from nursing to see if there is something more, she can take than Imodium or if she should adjust her dosing for better management/control.  Jasmine Long reports she has issues with her diverticulitis and that it's been causing mucus diarrhea and she has been taking one Imodium in am and again in the evening.  She was told for diarrhea she can take up to 8 tablets in 24 hour period.  She reports seeing her PCP last week who had prescribed her Augmentin which she has completed. Reports low-grade fever of 99.1 and notice some streaks of blood in the rectal discharge.  Denies pain, nausea, or vomiting at this time.  She mentioned that her PCP has instructed her to call back if she has large amount of bleeding.  RN instructed if notice large amount of bleeding to go to ED.  Jasmine Long states she's embarrassed to come out of home for fear of accidental incontinence and doesn't feel comfortable laying of table for treatment.  RN advised her to eat foods low in fiber such as oatmeal, white rice, bananas, and applesauce these will help to bind stools.  Get some incontinent briefs to anticipate any incontinent issues during treatment. To hydrate with water, Gatorade has electrolytes needed that may have been loss with diarrhea. Jasmine Long was advised that missing too many days of treatment was not a good idea and that its important to stay on track.  She stated understanding and will try to make it in for treatment tomorrow.  Will inform the care team.

## 2023-02-18 ENCOUNTER — Ambulatory Visit: Payer: Medicare HMO | Admitting: Radiation Oncology

## 2023-02-18 ENCOUNTER — Inpatient Hospital Stay: Payer: Medicare HMO

## 2023-02-18 ENCOUNTER — Telehealth: Payer: Self-pay

## 2023-02-18 ENCOUNTER — Encounter: Payer: Self-pay | Admitting: Radiology

## 2023-02-18 ENCOUNTER — Ambulatory Visit: Payer: Self-pay | Admitting: Emergency Medicine

## 2023-02-18 ENCOUNTER — Ambulatory Visit: Payer: Medicare HMO

## 2023-02-18 NOTE — Telephone Encounter (Signed)
Loose bowel movements with mucus is a result of multiple things, not just one. She just finished taking Augmentin which will give you diarrhea.  She is also on chemotherapy and radiation which will alter bowel movements.  Recommend to continue Imodium as needed and pay particular attention to hydration.  Diet modification is important too. BRAT diet with bananas, rice, applesauce, and toast.  Avoid meat and dairy products.  Build up diet slowly as tolerated.

## 2023-02-18 NOTE — Telephone Encounter (Signed)
Per pt ask PCP "Advise if this is normal for diverticulitis? And should she keep taking the immodium? Why does she keep getting this diverticulitis?"  Chief Complaint: mucus stool Symptoms: mucus movements Frequency: ongoing for weeks. Worse since 0100 Pertinent Negatives: Patient denies blood in stool, BM aside from mucus, fever,   Disposition: [] ED /[] Urgent Care (no appt availability in office) / [x] Appointment(In office/virtual)/ []  [facility]/ [] Home Care/ [x] Refused Recommended Disposition /[]  Mobile Bus/ []  Follow-up with PCP  Additional Notes: Pt states that this issue has been ongoing for awhile and PCP is aware and she has been seen by the PCP for this. Pt states that she hasn't had a "real bowel movement" since Monday, and states that she doesn't know if the mucus is d/t diverticulitis or constipation. Pt offered appt with pcp today but states that she can't d/t transportation issues. Pt would just like PCP to reach out with answers to her questions listed above. Pt states that she has reached out to the cancer center and they told her she needs to see her pcp and that she needs to cont her chemo and radiation therapy.   Copied from CRM [id number]. Topic: Clinical - Red Word Triage >> Feb 18, 2023  9:24 AM Steele Sizer wrote: Red Word that prompted transfer to Nurse Triage: Pt stated that she has diverticulitis, and says she's having a lot of mucus diarrhea, and nausea. Pt has had these symptoms for about two weeks Reason for Disposition . [1] Mucus or pus in stool AND [2] present > 2 days AND [3] diarrhea is more than mild  Answer Assessment - Initial Assessment Questions 1. DIARRHEA SEVERITY: "How bad is the diarrhea?" "How many more stools have you had in the past 24 hours than normal?"    - NO DIARRHEA (SCALE 0)   - MILD (SCALE 1-3): Few loose or mushy BMs; increase of 1-3 stools over normal daily number of stools; mild increase in ostomy output.   -  MODERATE  (SCALE 4-7): Increase of 4-6 stools daily over normal; moderate increase in ostomy output.   -  SEVERE (SCALE 8-10; OR "WORST POSSIBLE"): Increase of 7 or more stools daily over normal; moderate increase in ostomy output; incontinence.     Last BM was approx 4 days ago, pt states that since then she has been experiencing mucus only evacuations, pt states that she isn't getting any stool in her BMs 2. ONSET: "When did the diarrhea begin?"      Mucus movements started today at about 0100 3. BM CONSISTENCY: "How loose or watery is the diarrhea?"      mucus 4. VOMITING: "Are you also vomiting?" If Yes, ask: "How many times in the past 24 hours?"      denies 5. ABDOMEN PAIN: "Are you having any abdomen pain?" If Yes, ask: "What does it feel like?" (e.g., crampy, dull, intermittent, constant)      Yes feels like I'm constipated 6. ABDOMEN PAIN SEVERITY: If present, ask: "How bad is the pain?"  (e.g., Scale 1-10; mild, moderate, or severe)   - MILD (1-3): doesn't interfere with normal activities, abdomen soft and not tender to touch    - MODERATE (4-7): interferes with normal activities or awakens from sleep, abdomen tender to touch    - SEVERE (8-10): excruciating pain, doubled over, unable to do any normal activities       2 7. ORAL INTAKE: If vomiting, "Have you been able to drink liquids?" "How  much liquids have you had in the past 24 hours?"     Yes drinking liquids 8. HYDRATION: "Any signs of dehydration?" (e.g., dry mouth [not just dry lips], too weak to stand, dizziness, new weight loss) "When did you last urinate?"     denies 9. EXPOSURE: "Have you traveled to a foreign country recently?" "Have you been exposed to anyone with diarrhea?" "Could you have eaten any food that was spoiled?"     denies 10. ANTIBIOTIC USE: "Are you taking antibiotics now or have you taken antibiotics in the past 2 months?"       Yes, Augmentin last week 11. OTHER SYMPTOMS: "Do you have any other symptoms?" (e.g.,  fever, blood in stool)       denies  Protocols used: Diarrhea-A-AH

## 2023-02-18 NOTE — Telephone Encounter (Signed)
Called Ms. Dauphinais twice and finally her son answers irritated when I asked if Jasmine Long wishes to continue radiation treatment.  He didn't put her on phone said that she not feeling well (diarrhea) and would like to continue treatment but will not be coming in today.  Again, it was explained to him that missing 9 consecutive appointments aren't good. RN asked what's going on he explain she was still having diarrhea.  RN have addressed treatment options she could do to alleviate the problem yesterday when I spoke with Ms. Clelia Croft.  RN told him that it may be a good idea to have her come in to see medical oncology doctor or our symptom management office.  Looks like medonc appointment have been cancel this week too.  Verbalized understanding and ended call.  Will inform team of patient wishes.

## 2023-02-19 ENCOUNTER — Ambulatory Visit: Payer: Medicare HMO

## 2023-02-19 ENCOUNTER — Inpatient Hospital Stay: Payer: Medicare HMO

## 2023-02-20 ENCOUNTER — Ambulatory Visit (HOSPITAL_COMMUNITY)
Admission: RE | Admit: 2023-02-20 | Discharge: 2023-02-20 | Disposition: A | Discharge status: Home / Self Care | Payer: Medicare HMO | Source: Ambulatory Visit | Attending: Radiation Oncology | Admitting: Radiation Oncology

## 2023-02-20 DIAGNOSIS — C7931 Secondary malignant neoplasm of brain: Secondary | ICD-10-CM | POA: Diagnosis not present

## 2023-02-20 DIAGNOSIS — G939 Disorder of brain, unspecified: Secondary | ICD-10-CM | POA: Diagnosis not present

## 2023-02-20 MED ORDER — GADOBUTROL 1 MMOL/ML IV SOLN
7.0000 mL | Freq: Once | INTRAVENOUS | Status: AC | PRN
  Administered 2023-02-20: 7 mL via INTRAVENOUS

## 2023-02-21 ENCOUNTER — Other Ambulatory Visit: Payer: Self-pay | Admitting: Radiology

## 2023-02-22 ENCOUNTER — Other Ambulatory Visit: Payer: Self-pay

## 2023-02-22 ENCOUNTER — Other Ambulatory Visit: Payer: Medicare HMO

## 2023-02-22 ENCOUNTER — Ambulatory Visit
Admission: RE | Admit: 2023-02-22 | Discharge: 2023-02-22 | Disposition: A | Discharge status: Home / Self Care | Payer: Medicare HMO | Source: Ambulatory Visit | Attending: Radiation Oncology | Admitting: Radiation Oncology

## 2023-02-22 ENCOUNTER — Inpatient Hospital Stay: Payer: Medicare HMO

## 2023-02-22 ENCOUNTER — Ambulatory Visit (HOSPITAL_COMMUNITY)
Admission: RE | Admit: 2023-02-22 | Discharge: 2023-02-22 | Disposition: A | Discharge status: Home / Self Care | Payer: Medicare HMO | Source: Ambulatory Visit | Attending: Internal Medicine | Admitting: Internal Medicine

## 2023-02-22 DIAGNOSIS — C7931 Secondary malignant neoplasm of brain: Secondary | ICD-10-CM | POA: Diagnosis not present

## 2023-02-22 DIAGNOSIS — Z510 Encounter for antineoplastic radiation therapy: Secondary | ICD-10-CM | POA: Diagnosis not present

## 2023-02-22 DIAGNOSIS — Z1211 Encounter for screening for malignant neoplasm of colon: Secondary | ICD-10-CM | POA: Diagnosis not present

## 2023-02-22 DIAGNOSIS — C3412 Malignant neoplasm of upper lobe, left bronchus or lung: Secondary | ICD-10-CM | POA: Diagnosis not present

## 2023-02-22 LAB — RAD ONC ARIA SESSION SUMMARY
Course Elapsed Days: 35
Plan Fractions Treated to Date: 10
Plan Prescribed Dose Per Fraction: 2 Gy
Plan Total Fractions Prescribed: 33
Plan Total Prescribed Dose: 66 Gy
Reference Point Dosage Given to Date: 20 Gy
Reference Point Session Dosage Given: 2 Gy
Session Number: 10

## 2023-02-22 MED FILL — Fosaprepitant Dimeglumine For IV Infusion 150 MG (Base Eq): INTRAVENOUS | Qty: 5 | Status: AC

## 2023-02-22 NOTE — Progress Notes (Signed)
The pt called me this morning concerned that she was not aware that she had a port appt this afternoon and didn't find out until Friday afternoon and she didn't appreciate appts being scheduled without telling her. I was unaware that the appt had been scheduled, but apologized and canceled the appt. The pt asked that the appt be made for a Monday or Friday morning after her radiation appt at [facility]. I offered to schedule the appt at [facility], but pt declined.  Pt did say that her diarrhea had resolved and she was able to get the brain MRI needed for her SRS treatment. We reviewed her appts for Tuesday 1/28. Pt wasn't aware that her infusions had been scheduled for 1/28 and was hoping to reschedule them again to "give her veins a break" I let the pt know it is important that we try to stay on track for her treatment. Pt verbalized understanding.  I was able to arrange a port placement for Friday 2/7 at [facility] with a check in time of 11:30. I called the pt back to give her the details of her port placement. Pt is aware of date/time and instructions for preparation of the procedure was reviewed. Pt verbalized appreciation and I encouraged her to call me with any questions.

## 2023-02-22 NOTE — Telephone Encounter (Signed)
Thank you :)

## 2023-02-23 ENCOUNTER — Ambulatory Visit
Admission: RE | Admit: 2023-02-23 | Discharge: 2023-02-23 | Discharge status: Home / Self Care | Payer: Medicare HMO | Source: Ambulatory Visit | Attending: Radiation Oncology | Admitting: Radiation Oncology

## 2023-02-23 ENCOUNTER — Ambulatory Visit: Payer: Medicare HMO | Admitting: Radiation Oncology

## 2023-02-23 ENCOUNTER — Inpatient Hospital Stay: Payer: Medicare HMO | Admitting: Physician Assistant

## 2023-02-23 ENCOUNTER — Inpatient Hospital Stay: Payer: Medicare HMO

## 2023-02-23 ENCOUNTER — Other Ambulatory Visit: Payer: Self-pay

## 2023-02-23 ENCOUNTER — Other Ambulatory Visit: Payer: Self-pay | Admitting: Radiation Therapy

## 2023-02-23 ENCOUNTER — Ambulatory Visit
Admission: RE | Admit: 2023-02-23 | Discharge: 2023-02-23 | Disposition: A | Discharge status: Home / Self Care | Payer: Medicare HMO | Source: Ambulatory Visit | Attending: Radiation Oncology

## 2023-02-23 VITALS — BP 165/65 | HR 92 | Temp 96.8°F | Resp 20 | Ht 69.0 in | Wt 164.4 lb

## 2023-02-23 VITALS — BP 144/65 | HR 81 | Temp 97.4°F | Resp 15 | Wt 164.0 lb

## 2023-02-23 DIAGNOSIS — C180 Malignant neoplasm of cecum: Secondary | ICD-10-CM

## 2023-02-23 DIAGNOSIS — Z510 Encounter for antineoplastic radiation therapy: Secondary | ICD-10-CM | POA: Diagnosis not present

## 2023-02-23 DIAGNOSIS — Z1211 Encounter for screening for malignant neoplasm of colon: Secondary | ICD-10-CM | POA: Diagnosis not present

## 2023-02-23 DIAGNOSIS — C3412 Malignant neoplasm of upper lobe, left bronchus or lung: Secondary | ICD-10-CM

## 2023-02-23 DIAGNOSIS — C7931 Secondary malignant neoplasm of brain: Secondary | ICD-10-CM

## 2023-02-23 DIAGNOSIS — Z5111 Encounter for antineoplastic chemotherapy: Secondary | ICD-10-CM | POA: Diagnosis not present

## 2023-02-23 LAB — CMP (CANCER CENTER ONLY)
ALT: 7 U/L (ref 0–44)
AST: 12 U/L — ABNORMAL LOW (ref 15–41)
Albumin: 3.7 g/dL (ref 3.5–5.0)
Alkaline Phosphatase: 85 U/L (ref 38–126)
Anion gap: 6 (ref 5–15)
BUN: 14 mg/dL (ref 8–23)
CO2: 27 mmol/L (ref 22–32)
Calcium: 9.2 mg/dL (ref 8.9–10.3)
Chloride: 105 mmol/L (ref 98–111)
Creatinine: 0.88 mg/dL (ref 0.44–1.00)
GFR, Estimated: >60 mL/min (ref >60)
Glucose, Bld: 82 mg/dL (ref 70–99)
Potassium: 4.2 mmol/L (ref 3.5–5.1)
Sodium: 138 mmol/L (ref 135–145)
Total Bilirubin: 0.4 mg/dL (ref 0.0–1.2)
Total Protein: 6.8 g/dL (ref 6.5–8.1)

## 2023-02-23 LAB — RAD ONC ARIA SESSION SUMMARY
Course Elapsed Days: 36
Plan Fractions Treated to Date: 11
Plan Prescribed Dose Per Fraction: 2 Gy
Plan Total Fractions Prescribed: 33
Plan Total Prescribed Dose: 66 Gy
Reference Point Dosage Given to Date: 22 Gy
Reference Point Session Dosage Given: 2 Gy
Session Number: 11

## 2023-02-23 LAB — CBC WITH DIFFERENTIAL (CANCER CENTER ONLY)
Abs Immature Granulocytes: 0.02 10*3/uL (ref 0.00–0.07)
Basophils Absolute: 0 10*3/uL (ref 0.0–0.1)
Basophils Relative: 0 %
Eosinophils Absolute: 0 10*3/uL (ref 0.0–0.5)
Eosinophils Relative: 0 %
HCT: 32.5 % — ABNORMAL LOW (ref 36.0–46.0)
Hemoglobin: 10.7 g/dL — ABNORMAL LOW (ref 12.0–15.0)
Immature Granulocytes: 0 %
Lymphocytes Relative: 27 %
Lymphs Abs: 1.3 10*3/uL (ref 0.7–4.0)
MCH: 29.4 pg (ref 26.0–34.0)
MCHC: 32.9 g/dL (ref 30.0–36.0)
MCV: 89.3 fL (ref 80.0–100.0)
Monocytes Absolute: 0.6 10*3/uL (ref 0.1–1.0)
Monocytes Relative: 12 %
Neutro Abs: 2.9 10*3/uL (ref 1.7–7.7)
Neutrophils Relative %: 61 %
Platelet Count: 475 10*3/uL — ABNORMAL HIGH (ref 150–400)
RBC: 3.64 MIL/uL — ABNORMAL LOW (ref 3.87–5.11)
RDW: 15.4 % (ref 11.5–15.5)
WBC Count: 4.8 10*3/uL (ref 4.0–10.5)
nRBC: 0 % (ref 0.0–0.2)

## 2023-02-23 MED ORDER — DEXAMETHASONE SODIUM PHOSPHATE 10 MG/ML IJ SOLN
10.0000 mg | Freq: Once | INTRAMUSCULAR | Status: AC
  Administered 2023-02-23: 10 mg via INTRAVENOUS
  Filled 2023-02-23: qty 1

## 2023-02-23 MED ORDER — SODIUM CHLORIDE 0.9% FLUSH
10.0000 mL | INTRAVENOUS | Status: DC | PRN
  Administered 2023-02-23: 10 mL via INTRAVENOUS

## 2023-02-23 MED ORDER — SODIUM CHLORIDE 0.9 % IV SOLN: INTRAVENOUS | Status: DC

## 2023-02-23 MED ORDER — SODIUM CHLORIDE 0.9 % IV SOLN
443.0000 mg | Freq: Once | INTRAVENOUS | Status: AC
  Administered 2023-02-23: 440 mg via INTRAVENOUS
  Filled 2023-02-23: qty 44

## 2023-02-23 MED ORDER — SODIUM CHLORIDE 0.9 % IV SOLN
100.0000 mg/m2 | Freq: Once | INTRAVENOUS | Status: AC
  Administered 2023-02-23: 200 mg via INTRAVENOUS
  Filled 2023-02-23: qty 10

## 2023-02-23 MED ORDER — SODIUM CHLORIDE 0.9 % IV SOLN
150.0000 mg | Freq: Once | INTRAVENOUS | Status: AC
  Administered 2023-02-23: 150 mg via INTRAVENOUS
  Filled 2023-02-23: qty 5
  Filled 2023-02-23: qty 150
  Filled 2023-02-23: qty 5

## 2023-02-23 MED ORDER — PALONOSETRON HCL INJECTION 0.25 MG/5ML
0.2500 mg | Freq: Once | INTRAVENOUS | Status: AC
  Administered 2023-02-23: 0.25 mg via INTRAVENOUS
  Filled 2023-02-23: qty 5

## 2023-02-23 NOTE — Progress Notes (Addendum)
Has armband been applied?  Yes.    Does patient have an allergy to IV contrast dye?: No.   Has patient ever received premedication for IV contrast dye?: No.   Does patient take metformin?: Yes.    If patient does take metformin when was the last dose: February 21, 2023  Date of lab work: February 04, 2023 BUN: 18 CR: 0.84 eGfr: >60  IV site: antecubital right, condition patent and no redness (Removed/Replaced, due to NO back flow/ infiltration)  NEW IV SITE: RT- Wrist (Successful) by Alba Cory LPN  Has IV site been added to flowsheet?  Yes.    BP (!) 165/65 (BP Location: Left Arm, Patient Position: Sitting, Cuff Size: Normal)   Pulse 92   Temp (!) 96.8 F (36 C)   Resp 20   Ht 5\' 9"  (1.753 m)   Wt 164 lb 6.4 oz (74.6 kg)   SpO2 100%   BMI 24.28 kg/m   This concludes the interaction.  Ruel Favors, LPN

## 2023-02-23 NOTE — Patient Instructions (Signed)
[facility] - A DEPT OF [facility]  Discharge Instructions: Thank you for choosing [facility] to provide your oncology and hematology care.   If you have a lab appointment with the Cancer Center, please go directly to the Cancer Center and check in at the registration area.   Wear comfortable clothing and clothing appropriate for easy access to any Portacath or PICC line.   We strive to give you quality time with your provider. You may need to reschedule your appointment if you arrive late (15 or more minutes).  Arriving late affects you and other patients whose appointments are after yours.  Also, if you miss three or more appointments without notifying the office, you may be dismissed from the clinic at the provider's discretion.      For prescription refill requests, have your pharmacy contact our office and allow 72 hours for refills to be completed.    Today you received the following chemotherapy and/or immunotherapy agents: Carboplatin/Etoposide      To help prevent nausea and vomiting after your treatment, we encourage you to take your nausea medication as directed.  BELOW ARE SYMPTOMS THAT SHOULD BE REPORTED IMMEDIATELY: *FEVER GREATER THAN 100.4 F (38 C) OR HIGHER *CHILLS OR SWEATING *NAUSEA AND VOMITING THAT IS NOT CONTROLLED WITH YOUR NAUSEA MEDICATION *UNUSUAL SHORTNESS OF BREATH *UNUSUAL BRUISING OR BLEEDING *URINARY PROBLEMS (pain or burning when urinating, or frequent urination) *BOWEL PROBLEMS (unusual diarrhea, constipation, pain near the anus) TENDERNESS IN MOUTH AND THROAT WITH OR WITHOUT PRESENCE OF ULCERS (sore throat, sores in mouth, or a toothache) UNUSUAL RASH, SWELLING OR PAIN  UNUSUAL VAGINAL DISCHARGE OR ITCHING   Items with * indicate a potential emergency and should be followed up as soon as possible or go to the Emergency Department if any problems should occur.  Please show the CHEMOTHERAPY ALERT CARD or  IMMUNOTHERAPY ALERT CARD at check-in to the Emergency Department and triage nurse.  Should you have questions after your visit or need to cancel or reschedule your appointment, please contact [facility] - A DEPT OF [facility]  Dept: [phone]  and follow the prompts.  Office hours are 8:00 a.m. to 4:30 p.m. Monday - Friday. Please note that voicemails left after 4:00 p.m. may not be returned until the following business day.  We are closed weekends and major holidays. You have access to a nurse at all times for urgent questions. Please call the main number to the clinic Dept: [phone] and follow the prompts.   For any non-urgent questions, you may also contact your provider using MyChart. We now offer e-Visits for anyone 14 and older to request care online for non-urgent symptoms. For details visit [email or web address].   Also download the MyChart app! Go to the app store, search "MyChart", open the app, select [facility], and log in with your MyChart username and password.

## 2023-02-23 NOTE — Progress Notes (Signed)
  Radiation Oncology         [phone] ________________________________  Name: Jasmine Long MRN: [id number]  Date: 02/23/2023  DOB: [birth year]  SIMULATION AND TREATMENT PLANNING NOTE    ICD-10-CM   1. 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   NARRATIVE:  The patient was brought to the CT Simulation planning suite.  Identity was confirmed.  All relevant records and images related to the planned course of therapy were reviewed.  The patient freely provided informed written consent to proceed with treatment after reviewing the details related to the planned course of therapy. The consent form was witnessed and verified by the simulation staff. Intravenous access was established for contrast administration. Then, the patient was set-up in a stable reproducible supine position for radiation therapy.  A relocatable thermoplastic stereotactic head frame was fabricated for precise immobilization.  CT images were obtained.  Surface markings were placed.  The CT images were loaded into the planning software and fused with the patient's targeting MRI scan.  Then the target and avoidance structures were contoured.  Treatment planning then occurred.  The radiation prescription was entered and confirmed.  I have requested 3D planning  I have requested a DVH of the following structures: Brain stem, brain, left eye, right eye, lenses, optic chiasm, target volumes, uninvolved brain, and normal tissue.    SPECIAL TREATMENT PROCEDURE:  The planned course of therapy using radiation constitutes a special treatment procedure. Special care is required in the management of this patient for the following reasons. This treatment constitutes a Special Treatment Procedure for the following reason: High dose per fraction requiring special monitoring for increased toxicities of treatment including daily imaging.   The special nature of the planned course of radiotherapy will require increased physician supervision and oversight to ensure patient's safety with optimal treatment outcomes.  This requires extended time and effort.  PLAN:  The patient will receive 20 Gy in 1 fraction.  ________________________________  Artist Pais Kathrynn Running, M.D.

## 2023-02-24 ENCOUNTER — Other Ambulatory Visit: Payer: Self-pay

## 2023-02-24 ENCOUNTER — Ambulatory Visit
Admission: RE | Admit: 2023-02-24 | Discharge: 2023-02-24 | Disposition: A | Discharge status: Home / Self Care | Payer: Medicare HMO | Source: Ambulatory Visit | Attending: Radiation Oncology

## 2023-02-24 ENCOUNTER — Inpatient Hospital Stay: Payer: Medicare HMO

## 2023-02-24 VITALS — BP 149/60 | HR 83 | Temp 98.0°F | Resp 18

## 2023-02-24 DIAGNOSIS — Z510 Encounter for antineoplastic radiation therapy: Secondary | ICD-10-CM | POA: Diagnosis not present

## 2023-02-24 DIAGNOSIS — C3412 Malignant neoplasm of upper lobe, left bronchus or lung: Secondary | ICD-10-CM

## 2023-02-24 DIAGNOSIS — Z1211 Encounter for screening for malignant neoplasm of colon: Secondary | ICD-10-CM | POA: Diagnosis not present

## 2023-02-24 DIAGNOSIS — C7931 Secondary malignant neoplasm of brain: Secondary | ICD-10-CM | POA: Diagnosis not present

## 2023-02-24 LAB — RAD ONC ARIA SESSION SUMMARY
Course Elapsed Days: 37
Plan Fractions Treated to Date: 12
Plan Prescribed Dose Per Fraction: 2 Gy
Plan Total Fractions Prescribed: 33
Plan Total Prescribed Dose: 66 Gy
Reference Point Dosage Given to Date: 24 Gy
Reference Point Session Dosage Given: 2 Gy
Session Number: 12

## 2023-02-24 MED ORDER — SODIUM CHLORIDE 0.9 % IV SOLN
100.0000 mg/m2 | Freq: Once | INTRAVENOUS | Status: AC
  Administered 2023-02-24: 200 mg via INTRAVENOUS
  Filled 2023-02-24: qty 10

## 2023-02-24 MED ORDER — SODIUM CHLORIDE 0.9 % IV SOLN: INTRAVENOUS | Status: DC

## 2023-02-24 MED ORDER — DEXAMETHASONE SODIUM PHOSPHATE 10 MG/ML IJ SOLN
10.0000 mg | Freq: Once | INTRAMUSCULAR | Status: AC
Start: 2023-02-24 — End: 2023-02-24
  Administered 2023-02-24: 10 mg via INTRAVENOUS
  Filled 2023-02-24: qty 1

## 2023-02-24 NOTE — Patient Instructions (Signed)
[facility] - A DEPT OF [facility]  Discharge Instructions: Thank you for choosing [facility] to provide your oncology and hematology care.   If you have a lab appointment with the Cancer Center, please go directly to the Cancer Center and check in at the registration area.   Wear comfortable clothing and clothing appropriate for easy access to any Portacath or PICC line.   We strive to give you quality time with your provider. You may need to reschedule your appointment if you arrive late (15 or more minutes).  Arriving late affects you and other patients whose appointments are after yours.  Also, if you miss three or more appointments without notifying the office, you may be dismissed from the clinic at the provider's discretion.      For prescription refill requests, have your pharmacy contact our office and allow 72 hours for refills to be completed.    Today you received the following chemotherapy and/or immunotherapy agents: Etoposide      To help prevent nausea and vomiting after your treatment, we encourage you to take your nausea medication as directed.  BELOW ARE SYMPTOMS THAT SHOULD BE REPORTED IMMEDIATELY: *FEVER GREATER THAN 100.4 F (38 C) OR HIGHER *CHILLS OR SWEATING *NAUSEA AND VOMITING THAT IS NOT CONTROLLED WITH YOUR NAUSEA MEDICATION *UNUSUAL SHORTNESS OF BREATH *UNUSUAL BRUISING OR BLEEDING *URINARY PROBLEMS (pain or burning when urinating, or frequent urination) *BOWEL PROBLEMS (unusual diarrhea, constipation, pain near the anus) TENDERNESS IN MOUTH AND THROAT WITH OR WITHOUT PRESENCE OF ULCERS (sore throat, sores in mouth, or a toothache) UNUSUAL RASH, SWELLING OR PAIN  UNUSUAL VAGINAL DISCHARGE OR ITCHING   Items with * indicate a potential emergency and should be followed up as soon as possible or go to the Emergency Department if any problems should occur.  Please show the CHEMOTHERAPY ALERT CARD or IMMUNOTHERAPY  ALERT CARD at check-in to the Emergency Department and triage nurse.  Should you have questions after your visit or need to cancel or reschedule your appointment, please contact [facility] - A DE[facility]  Dept: [phone]  and follow the prompts.  Office hours are 8:00 a.m. to 4:30 p.m. Monday - Friday. Please note that voicemails left after 4:00 p.m. may not be returned until the following business day.  We are closed weekends and major holidays. You have access to a nurse at all times for urgent questions. Please call the main number to the clinic Dept: [phone] and follow the prompts.   For any non-urgent questions, you may also contact your provider using MyChart. We now offer e-Visits for anyone 10 and older to request care online for non-urgent symptoms. For details visit [email or web address].   Also download the MyChart app! Go to the app store, search "MyChart", open the app, select [place], and log in with your MyChart username and password.

## 2023-02-25 ENCOUNTER — Telehealth: Payer: Self-pay

## 2023-02-25 ENCOUNTER — Other Ambulatory Visit: Payer: Self-pay

## 2023-02-25 ENCOUNTER — Institutional Professional Consult (permissible substitution): Payer: Medicare HMO | Admitting: Emergency Medicine

## 2023-02-25 ENCOUNTER — Inpatient Hospital Stay: Payer: Medicare HMO

## 2023-02-25 ENCOUNTER — Encounter: Payer: Self-pay | Admitting: Emergency Medicine

## 2023-02-25 ENCOUNTER — Ambulatory Visit
Admission: RE | Admit: 2023-02-25 | Discharge: 2023-02-25 | Disposition: A | Discharge status: Home / Self Care | Payer: Medicare HMO | Source: Ambulatory Visit | Attending: Radiation Oncology | Admitting: Radiation Oncology

## 2023-02-25 VITALS — BP 167/55 | HR 81 | Temp 98.0°F | Resp 18

## 2023-02-25 DIAGNOSIS — C7931 Secondary malignant neoplasm of brain: Secondary | ICD-10-CM | POA: Diagnosis not present

## 2023-02-25 DIAGNOSIS — C3412 Malignant neoplasm of upper lobe, left bronchus or lung: Secondary | ICD-10-CM

## 2023-02-25 DIAGNOSIS — Z1211 Encounter for screening for malignant neoplasm of colon: Secondary | ICD-10-CM | POA: Diagnosis not present

## 2023-02-25 DIAGNOSIS — Z510 Encounter for antineoplastic radiation therapy: Secondary | ICD-10-CM | POA: Diagnosis not present

## 2023-02-25 LAB — RAD ONC ARIA SESSION SUMMARY
Course Elapsed Days: 38
Plan Fractions Treated to Date: 13
Plan Prescribed Dose Per Fraction: 2 Gy
Plan Total Fractions Prescribed: 33
Plan Total Prescribed Dose: 66 Gy
Reference Point Dosage Given to Date: 26 Gy
Reference Point Session Dosage Given: 2 Gy
Session Number: 13

## 2023-02-25 MED ORDER — SODIUM CHLORIDE 0.9 % IV SOLN: INTRAVENOUS | Status: DC

## 2023-02-25 MED ORDER — DEXAMETHASONE SODIUM PHOSPHATE 10 MG/ML IJ SOLN
10.0000 mg | Freq: Once | INTRAMUSCULAR | Status: AC
Start: 2023-02-25 — End: 2023-02-25
  Administered 2023-02-25: 10 mg via INTRAVENOUS
  Filled 2023-02-25: qty 1

## 2023-02-25 MED ORDER — SODIUM CHLORIDE 0.9 % IV SOLN
100.0000 mg/m2 | Freq: Once | INTRAVENOUS | Status: AC
  Administered 2023-02-25: 200 mg via INTRAVENOUS
  Filled 2023-02-25: qty 10

## 2023-02-25 NOTE — Patient Instructions (Signed)
[facility] - A DEPT OF [facility]  Discharge Instructions: Thank you for choosing [facility] to provide your oncology and hematology care.   If you have a lab appointment with the Cancer Center, please go directly to the Cancer Center and check in at the registration area.   Wear comfortable clothing and clothing appropriate for easy access to any Portacath or PICC line.   We strive to give you quality time with your provider. You may need to reschedule your appointment if you arrive late (15 or more minutes).  Arriving late affects you and other patients whose appointments are after yours.  Also, if you miss three or more appointments without notifying the office, you may be dismissed from the clinic at the provider's discretion.      For prescription refill requests, have your pharmacy contact our office and allow 72 hours for refills to be completed.    Today you received the following chemotherapy and/or immunotherapy agents: Etoposide      To help prevent nausea and vomiting after your treatment, we encourage you to take your nausea medication as directed.  BELOW ARE SYMPTOMS THAT SHOULD BE REPORTED IMMEDIATELY: *FEVER GREATER THAN 100.4 F (38 C) OR HIGHER *CHILLS OR SWEATING *NAUSEA AND VOMITING THAT IS NOT CONTROLLED WITH YOUR NAUSEA MEDICATION *UNUSUAL SHORTNESS OF BREATH *UNUSUAL BRUISING OR BLEEDING *URINARY PROBLEMS (pain or burning when urinating, or frequent urination) *BOWEL PROBLEMS (unusual diarrhea, constipation, pain near the anus) TENDERNESS IN MOUTH AND THROAT WITH OR WITHOUT PRESENCE OF ULCERS (sore throat, sores in mouth, or a toothache) UNUSUAL RASH, SWELLING OR PAIN  UNUSUAL VAGINAL DISCHARGE OR ITCHING   Items with * indicate a potential emergency and should be followed up as soon as possible or go to the Emergency Department if any problems should occur.  Please show the CHEMOTHERAPY ALERT CARD or IMMUNOTHERAPY  ALERT CARD at check-in to the Emergency Department and triage nurse.  Should you have questions after your visit or need to cancel or reschedule your appointment, please contact [facility] - A DE[facility]  Dept: [phone]  and follow the prompts.  Office hours are 8:00 a.m. to 4:30 p.m. Monday - Friday. Please note that voicemails left after 4:00 p.m. may not be returned until the following business day.  We are closed weekends and major holidays. You have access to a nurse at all times for urgent questions. Please call the main number to the clinic Dept: [phone] and follow the prompts.   For any non-urgent questions, you may also contact your provider using MyChart. We now offer e-Visits for anyone 10 and older to request care online for non-urgent symptoms. For details visit [email or web address].   Also download the MyChart app! Go to the app store, search "MyChart", open the app, select [place], and log in with your MyChart username and password.

## 2023-02-25 NOTE — Progress Notes (Signed)
Returned pt phone call at this time to discuss her request for how to obtain a wig and the details of her port placement on 2/7. I let the pt know I faxed her information and a Rx for a wig to a business called [employer] which specializes in equipment for cancer patients. I told her they should be calling her to obtain her insurance information and go from there. Pt verbalized understanding.  Pts main concern about her port placement is that she is afraid to go so many hours without being able to drink because the medications she takes give her dry mouth. I reached out to the [facility] IR scheduler, Denita Lung, via [place] at the time of my call with the pt. Tiffany states that the pt can have clear liquids until 7am. I let the pt know this. I also asked Tiffany if it would be okay for her to swish and spit water to keep her mouth moist. Tiffany responded that it would be fine as long as she isn't drinking the water. Pt assured me that she would not drink the water and that she just needs to keep her mouth moist.  Pt had no other concerns or issues to discuss at this time.

## 2023-02-25 NOTE — Telephone Encounter (Signed)
RN called patient to inform her of that appointment with Dr. Delton Coombes had been canceled today due to the time being same as her radiation treatment.  No answer wasn't able to leave message.

## 2023-02-25 NOTE — Telephone Encounter (Signed)
Please just cancel her appointment with Dr Delton Coombes from today and have her rescheduled. Do not mark as a no show

## 2023-02-26 ENCOUNTER — Ambulatory Visit: Payer: Medicare HMO | Admitting: Radiation Oncology

## 2023-02-26 ENCOUNTER — Ambulatory Visit
Admission: RE | Admit: 2023-02-26 | Discharge: 2023-02-26 | Disposition: A | Discharge status: Home / Self Care | Payer: Medicare HMO | Source: Ambulatory Visit | Attending: Radiation Oncology

## 2023-02-26 ENCOUNTER — Ambulatory Visit
Admission: RE | Admit: 2023-02-26 | Discharge: 2023-02-26 | Disposition: A | Discharge status: Home / Self Care | Payer: Medicare HMO | Source: Ambulatory Visit | Attending: Radiation Oncology | Admitting: Radiation Oncology

## 2023-02-26 ENCOUNTER — Other Ambulatory Visit: Payer: Self-pay

## 2023-02-26 DIAGNOSIS — C7931 Secondary malignant neoplasm of brain: Secondary | ICD-10-CM | POA: Diagnosis not present

## 2023-02-26 DIAGNOSIS — Z1211 Encounter for screening for malignant neoplasm of colon: Secondary | ICD-10-CM | POA: Diagnosis not present

## 2023-02-26 DIAGNOSIS — C3412 Malignant neoplasm of upper lobe, left bronchus or lung: Secondary | ICD-10-CM | POA: Diagnosis not present

## 2023-02-26 DIAGNOSIS — Z510 Encounter for antineoplastic radiation therapy: Secondary | ICD-10-CM | POA: Diagnosis not present

## 2023-02-26 LAB — RAD ONC ARIA SESSION SUMMARY
Course Elapsed Days: 39
Plan Fractions Treated to Date: 14
Plan Prescribed Dose Per Fraction: 2 Gy
Plan Total Fractions Prescribed: 33
Plan Total Prescribed Dose: 66 Gy
Reference Point Dosage Given to Date: 28 Gy
Reference Point Session Dosage Given: 2 Gy
Session Number: 14

## 2023-03-01 ENCOUNTER — Telehealth: Payer: Self-pay | Admitting: Radiation Therapy

## 2023-03-01 ENCOUNTER — Inpatient Hospital Stay: Payer: Medicare HMO

## 2023-03-01 ENCOUNTER — Other Ambulatory Visit: Payer: Self-pay

## 2023-03-01 ENCOUNTER — Ambulatory Visit: Payer: Medicare HMO | Attending: Cardiovascular Disease | Admitting: Cardiovascular Disease

## 2023-03-01 ENCOUNTER — Ambulatory Visit
Admission: RE | Admit: 2023-03-01 | Discharge: 2023-03-01 | Disposition: A | Discharge status: Home / Self Care | Payer: Medicare HMO | Source: Ambulatory Visit | Attending: Radiation Oncology | Admitting: Radiation Oncology

## 2023-03-01 ENCOUNTER — Other Ambulatory Visit: Payer: Medicare HMO

## 2023-03-01 DIAGNOSIS — Z510 Encounter for antineoplastic radiation therapy: Secondary | ICD-10-CM | POA: Insufficient documentation

## 2023-03-01 DIAGNOSIS — C3412 Malignant neoplasm of upper lobe, left bronchus or lung: Secondary | ICD-10-CM | POA: Insufficient documentation

## 2023-03-01 DIAGNOSIS — Z79899 Other long term (current) drug therapy: Secondary | ICD-10-CM | POA: Diagnosis not present

## 2023-03-01 DIAGNOSIS — C7931 Secondary malignant neoplasm of brain: Secondary | ICD-10-CM | POA: Insufficient documentation

## 2023-03-01 DIAGNOSIS — Z5111 Encounter for antineoplastic chemotherapy: Secondary | ICD-10-CM | POA: Diagnosis not present

## 2023-03-01 DIAGNOSIS — F17210 Nicotine dependence, cigarettes, uncomplicated: Secondary | ICD-10-CM | POA: Diagnosis not present

## 2023-03-01 LAB — RAD ONC ARIA SESSION SUMMARY
Course Elapsed Days: 42
Plan Fractions Treated to Date: 15
Plan Prescribed Dose Per Fraction: 2 Gy
Plan Total Fractions Prescribed: 33
Plan Total Prescribed Dose: 66 Gy
Reference Point Dosage Given to Date: 30 Gy
Reference Point Session Dosage Given: 2 Gy
Session Number: 15

## 2023-03-01 NOTE — Telephone Encounter (Addendum)
Jasmine Long called to inform us that she has not been receiving her appt change messages from MyChart, they have gone to her spam folder, so she was not aware of the lab appt scheduled to happen before her radiation today. She was unable to stay after radiation to have labs drawn due to having to get back home by a certain time for a neighbor to be able to assist her out of the car and back into her home.   Unfortunately, her son was admitted over the weekend with "caregiver fatigue". She does not know when he will be discharged.  She has a friend that will sometimes help her, but she currently has COVID. I have advised [place] to stay away from her if possible to avoid catching COVID herself.   Jasmine Long is concerned about her port placement on Friday 2/7, because she will not be able to drive herself and the neighbor that helps her is leaving to go out of town that morning. She is not even sure she will be able to attend her radiation appt that day for the same reason. I have shared this information with Jasmine Mulling, RN, in case the port appt will need to be rescheduled.   Jasmine Long R.T.(R)(T) Radiation Special Procedures Lead

## 2023-03-02 ENCOUNTER — Other Ambulatory Visit: Payer: Self-pay

## 2023-03-02 ENCOUNTER — Ambulatory Visit
Admission: RE | Admit: 2023-03-02 | Discharge: 2023-03-02 | Disposition: A | Discharge status: Home / Self Care | Payer: Medicare HMO | Source: Ambulatory Visit | Attending: Radiation Oncology | Admitting: Radiation Oncology

## 2023-03-02 DIAGNOSIS — F17210 Nicotine dependence, cigarettes, uncomplicated: Secondary | ICD-10-CM | POA: Diagnosis not present

## 2023-03-02 DIAGNOSIS — C7931 Secondary malignant neoplasm of brain: Secondary | ICD-10-CM | POA: Diagnosis not present

## 2023-03-02 DIAGNOSIS — C3412 Malignant neoplasm of upper lobe, left bronchus or lung: Secondary | ICD-10-CM | POA: Diagnosis not present

## 2023-03-02 DIAGNOSIS — Z510 Encounter for antineoplastic radiation therapy: Secondary | ICD-10-CM | POA: Diagnosis not present

## 2023-03-02 DIAGNOSIS — Z5111 Encounter for antineoplastic chemotherapy: Secondary | ICD-10-CM | POA: Diagnosis not present

## 2023-03-02 DIAGNOSIS — Z79899 Other long term (current) drug therapy: Secondary | ICD-10-CM | POA: Diagnosis not present

## 2023-03-02 LAB — RAD ONC ARIA SESSION SUMMARY
Course Elapsed Days: 43
Plan Fractions Treated to Date: 16
Plan Prescribed Dose Per Fraction: 2 Gy
Plan Total Fractions Prescribed: 33
Plan Total Prescribed Dose: 66 Gy
Reference Point Dosage Given to Date: 32 Gy
Reference Point Session Dosage Given: 2 Gy
Session Number: 16

## 2023-03-03 ENCOUNTER — Telehealth: Payer: Self-pay | Admitting: Radiation Therapy

## 2023-03-03 ENCOUNTER — Encounter: Payer: Self-pay | Admitting: Internal Medicine

## 2023-03-03 ENCOUNTER — Other Ambulatory Visit: Payer: Self-pay

## 2023-03-03 ENCOUNTER — Ambulatory Visit
Admission: RE | Admit: 2023-03-03 | Discharge: 2023-03-03 | Disposition: A | Discharge status: Home / Self Care | Payer: Medicare HMO | Source: Ambulatory Visit | Attending: Radiation Oncology | Admitting: Radiation Oncology

## 2023-03-03 DIAGNOSIS — Z5111 Encounter for antineoplastic chemotherapy: Secondary | ICD-10-CM | POA: Diagnosis not present

## 2023-03-03 DIAGNOSIS — Z510 Encounter for antineoplastic radiation therapy: Secondary | ICD-10-CM | POA: Diagnosis not present

## 2023-03-03 DIAGNOSIS — C3412 Malignant neoplasm of upper lobe, left bronchus or lung: Secondary | ICD-10-CM | POA: Diagnosis not present

## 2023-03-03 DIAGNOSIS — C7931 Secondary malignant neoplasm of brain: Secondary | ICD-10-CM | POA: Diagnosis not present

## 2023-03-03 DIAGNOSIS — F17210 Nicotine dependence, cigarettes, uncomplicated: Secondary | ICD-10-CM | POA: Diagnosis not present

## 2023-03-03 DIAGNOSIS — Z79899 Other long term (current) drug therapy: Secondary | ICD-10-CM | POA: Diagnosis not present

## 2023-03-03 LAB — RAD ONC ARIA SESSION SUMMARY
Course Elapsed Days: 44
Plan Fractions Treated to Date: 17
Plan Prescribed Dose Per Fraction: 2 Gy
Plan Total Fractions Prescribed: 33
Plan Total Prescribed Dose: 66 Gy
Reference Point Dosage Given to Date: 34 Gy
Reference Point Session Dosage Given: 2 Gy
Session Number: 17

## 2023-03-03 NOTE — Progress Notes (Signed)
 Follow up call to patient to check on her well-being and assess navigation needs. Unable to leave VM. Will try again at a later time.

## 2023-03-03 NOTE — Progress Notes (Signed)
 Called pt to reschedule pt's port appt on 2/7 if she hasn't established a ride yet, pt did not answer her mobile phone number and I was unable to leave a VM. I also called pts home phone, no answer but I left a VM requesting a call back.

## 2023-03-03 NOTE — Telephone Encounter (Signed)
 I called to talk to Jasmine Long about her brain treatment tomorrow morning. She did not answer, I was not able to leave a message on her cell, but left a message on her home line reminding her to check in at 9:15 for the brain to be treated first and then we will treat her chest afterwards.   Devere Perch R.T.(R)(T) Radiation Special Procedures Lead

## 2023-03-04 ENCOUNTER — Other Ambulatory Visit: Payer: Self-pay

## 2023-03-04 ENCOUNTER — Other Ambulatory Visit: Payer: Self-pay | Admitting: Student

## 2023-03-04 ENCOUNTER — Ambulatory Visit: Admission: RE | Admit: 2023-03-04 | Payer: Medicare HMO | Source: Ambulatory Visit

## 2023-03-04 ENCOUNTER — Ambulatory Visit
Admission: RE | Admit: 2023-03-04 | Discharge: 2023-03-04 | Disposition: A | Discharge status: Home / Self Care | Payer: Medicare HMO | Source: Ambulatory Visit | Attending: Radiation Oncology | Admitting: Radiation Oncology

## 2023-03-04 VITALS — BP 153/65 | HR 96 | Temp 97.3°F | Resp 20

## 2023-03-04 DIAGNOSIS — C7931 Secondary malignant neoplasm of brain: Secondary | ICD-10-CM

## 2023-03-04 DIAGNOSIS — C3412 Malignant neoplasm of upper lobe, left bronchus or lung: Secondary | ICD-10-CM | POA: Diagnosis not present

## 2023-03-04 DIAGNOSIS — Z510 Encounter for antineoplastic radiation therapy: Secondary | ICD-10-CM | POA: Diagnosis not present

## 2023-03-04 DIAGNOSIS — F17210 Nicotine dependence, cigarettes, uncomplicated: Secondary | ICD-10-CM | POA: Diagnosis not present

## 2023-03-04 DIAGNOSIS — Z5111 Encounter for antineoplastic chemotherapy: Secondary | ICD-10-CM | POA: Diagnosis not present

## 2023-03-04 DIAGNOSIS — Z79899 Other long term (current) drug therapy: Secondary | ICD-10-CM | POA: Diagnosis not present

## 2023-03-04 LAB — RAD ONC ARIA SESSION SUMMARY
Course Elapsed Days: 45
Plan Fractions Treated to Date: 1
Plan Prescribed Dose Per Fraction: 20 Gy
Plan Total Fractions Prescribed: 1
Plan Total Prescribed Dose: 20 Gy
Reference Point Dosage Given to Date: 20 Gy
Reference Point Session Dosage Given: 20 Gy
Session Number: 18

## 2023-03-04 NOTE — Progress Notes (Signed)
  Radiation Oncology         [phone] ________________________________  Stereotactic Treatment Procedure Note  Name: Jasmine Long MRN: [id number]  Date: 03/04/2023  DOB: 1950/04/16  SPECIAL TREATMENT PROCEDURE    ICD-10-CM   1. 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  It showed 3-dimensional radiation distributions overlaid onto the planning CT/MRI image set.  The DVH's for the target structures as well as the organs at risk were reviewed. The documentation of the 3D plan and dosimetry are filed in the radiation oncology EMR.  NARRATIVE:  Jasmine Long was brought to the TrueBeam stereotactic radiation treatment machine and placed supine on the CT couch. The head frame was applied, and the patient was set up for stereotactic radiosurgery.  Neurosurgery was present for the set-up and delivery  SIMULATION VERIFICATION:  In the couch zero-angle position, the patient underwent Exactrac imaging using the Brainlab system with orthogonal KV images.  These were carefully aligned and repeated to confirm treatment position for each of the isocenters.  The Exactrac snap film verification was repeated at each couch angle.  PROCEDURE: Jasmine Long received stereotactic radiosurgery to the following targets: Right parietal 4 mm target was treated using 4 Dynamic Conformal Arcs to a prescription dose of 20 Gy.  ExacTrac registration was performed for each couch angle.  The 100% isodose line was prescribed.  6 MV X-rays were delivered in the flattening filter free beam mode.  STEREOTACTIC TREATMENT MANAGEMENT:  Following delivery, the patient was transported to nursing in stable condition and monitored for possible acute effects.  Vital signs were recorded BP (!) 153/65 (BP Location: Right Arm, Patient Position: Sitting, Cuff Size: Normal)   Pulse 96   Temp (!)  97.3 F (36.3 C)   Resp 20   SpO2 100% . The patient tolerated treatment without significant acute effects, and was discharged to home in stable condition.    PLAN: Follow-up in one month.  ________________________________  [name], M.D.

## 2023-03-04 NOTE — Progress Notes (Signed)
 Patient to nursing for observation SRS Brain 1/1.  Denies headache, fatigue, ringing in the ears, visually changes, diarrhea, and no skin irritation.  Report some nausea no vomiting at this time.  Gait uses a cane for mobility.  No Decadron  orders.  Vitals:  97.3-96-20-153/65 O2 sat 100% RA.  Advised not to do anything strenuous for next 24 hours and to call [phone], if any changes develop.

## 2023-03-04 NOTE — Op Note (Signed)
  Name: Jasmine Long  MRN: [id number]  Date: 03/04/2023   DOB: 1950/04/19  Stereotactic Radiosurgery Operative Note  PRE-OPERATIVE DIAGNOSIS:  Solitary Brain Metastasis  POST-OPERATIVE DIAGNOSIS:  Solitary Brain Metastasis  PROCEDURE:  Stereotactic Radiosurgery  SURGEON:  Victory DELENA Gunnels, MD  NARRATIVE: The patient underwent a radiation treatment planning session in the radiation oncology simulation suite under the care of the radiation oncology physician and physicist.  I participated closely in the radiation treatment planning afterwards. The patient underwent planning CT which was fused to 3T high resolution MRI with 1 mm axial slices.  These images were fused on the planning system.  We contoured the gross target volumes and subsequently expanded this to yield the Planning Target Volume. I actively participated in the planning process.  I helped to define and review the target contours and also the contours of the optic pathway, eyes, brainstem and selected nearby organs at risk.  All the dose constraints for critical structures were reviewed and compared to AAPM Task Group 101.  The prescription dose conformity was reviewed.  I approved the plan electronically.    Accordingly, Jenkins MARLA Gentry was brought to the TrueBeam stereotactic radiation treatment linac and placed in the custom immobilization mask.  The patient was aligned according to the IR fiducial markers with BrainLab Exactrac, then orthogonal x-rays were used in ExacTrac with the 6DOF robotic table and the shifts were made to align the patient  Jenkins MARLA Gentry received stereotactic radiosurgery uneventfully.    The detailed description of the procedure is recorded in the radiation oncology procedure note.  I was present for the duration of the procedure.  DISPOSITION:  Following delivery, the patient was transported to nursing in stable condition and monitored for possible acute effects to be discharged to home in stable condition with follow-up in  one month.  Victory DELENA Gunnels, MD 03/04/2023 11:06 AM

## 2023-03-05 ENCOUNTER — Ambulatory Visit: Payer: Medicare HMO

## 2023-03-05 ENCOUNTER — Other Ambulatory Visit (HOSPITAL_COMMUNITY): Payer: Medicare HMO

## 2023-03-05 ENCOUNTER — Ambulatory Visit (HOSPITAL_COMMUNITY): Payer: Medicare HMO

## 2023-03-05 NOTE — Progress Notes (Signed)
 Pt left a VM on 2/6 at 4:18pm requesting a call back and to let me know that she won't be able to get to her port placement tomorrow. I called the pt back this morning and Pts son is currently not well, her best friend has COVID and the only other person locally who can help her is going out of town this morning so she won't be able to make it to the port placement or radiation. Pt states she has neighbors coming back from [place] this weekend who may be able to assist her. I let the pt know I won't be rescheduling the port until she's secured the assistance required, meaning a ride to and from the procedure and someone who can stay with her to monitor her. Pt is unsure when her son Harden will be available again. I asked the pt to keep us  informed of her situation and we will do our best to accommodate her. Pt will know better over the weekend whether or not the neighbors from [place] will be able to help her and will leave me or Devere Perch a VM to let us  know if she's able to come to her radiation and lab appts on 2/10.  I verbalized understanding and encouraged the pt to keep me informed of her situation.  I have emailed [employer], presenter, broadcasting, requesting her reach out to the pt.

## 2023-03-08 ENCOUNTER — Ambulatory Visit: Payer: Medicare HMO | Admitting: Internal Medicine

## 2023-03-08 ENCOUNTER — Inpatient Hospital Stay: Payer: Medicare HMO

## 2023-03-08 ENCOUNTER — Other Ambulatory Visit: Payer: Self-pay

## 2023-03-08 ENCOUNTER — Other Ambulatory Visit: Payer: Medicare HMO

## 2023-03-08 ENCOUNTER — Ambulatory Visit: Payer: Medicare HMO

## 2023-03-08 ENCOUNTER — Ambulatory Visit
Admission: RE | Admit: 2023-03-08 | Discharge: 2023-03-08 | Disposition: A | Discharge status: Home / Self Care | Payer: Medicare HMO | Source: Ambulatory Visit | Attending: Radiation Oncology

## 2023-03-08 DIAGNOSIS — Z79899 Other long term (current) drug therapy: Secondary | ICD-10-CM | POA: Insufficient documentation

## 2023-03-08 DIAGNOSIS — C3412 Malignant neoplasm of upper lobe, left bronchus or lung: Secondary | ICD-10-CM | POA: Insufficient documentation

## 2023-03-08 DIAGNOSIS — C7931 Secondary malignant neoplasm of brain: Secondary | ICD-10-CM | POA: Insufficient documentation

## 2023-03-08 DIAGNOSIS — Z5111 Encounter for antineoplastic chemotherapy: Secondary | ICD-10-CM | POA: Insufficient documentation

## 2023-03-08 DIAGNOSIS — F17210 Nicotine dependence, cigarettes, uncomplicated: Secondary | ICD-10-CM | POA: Insufficient documentation

## 2023-03-08 DIAGNOSIS — Z510 Encounter for antineoplastic radiation therapy: Secondary | ICD-10-CM | POA: Diagnosis not present

## 2023-03-08 LAB — RAD ONC ARIA SESSION SUMMARY
Course Elapsed Days: 49
Plan Fractions Treated to Date: 19
Plan Prescribed Dose Per Fraction: 2 Gy
Plan Total Fractions Prescribed: 33
Plan Total Prescribed Dose: 66 Gy
Reference Point Dosage Given to Date: 38 Gy
Reference Point Session Dosage Given: 2 Gy
Session Number: 19

## 2023-03-09 ENCOUNTER — Ambulatory Visit
Admission: RE | Admit: 2023-03-09 | Discharge: 2023-03-09 | Disposition: A | Discharge status: Home / Self Care | Payer: Medicare HMO | Source: Ambulatory Visit | Attending: Radiation Oncology | Admitting: Radiation Oncology

## 2023-03-09 ENCOUNTER — Ambulatory Visit: Payer: Medicare HMO

## 2023-03-09 ENCOUNTER — Other Ambulatory Visit: Payer: Self-pay

## 2023-03-09 DIAGNOSIS — Z5111 Encounter for antineoplastic chemotherapy: Secondary | ICD-10-CM | POA: Diagnosis not present

## 2023-03-09 DIAGNOSIS — F17210 Nicotine dependence, cigarettes, uncomplicated: Secondary | ICD-10-CM | POA: Diagnosis not present

## 2023-03-09 DIAGNOSIS — Z79899 Other long term (current) drug therapy: Secondary | ICD-10-CM | POA: Diagnosis not present

## 2023-03-09 DIAGNOSIS — C3412 Malignant neoplasm of upper lobe, left bronchus or lung: Secondary | ICD-10-CM | POA: Diagnosis not present

## 2023-03-09 DIAGNOSIS — C7931 Secondary malignant neoplasm of brain: Secondary | ICD-10-CM | POA: Diagnosis not present

## 2023-03-09 DIAGNOSIS — Z510 Encounter for antineoplastic radiation therapy: Secondary | ICD-10-CM | POA: Diagnosis not present

## 2023-03-09 LAB — RAD ONC ARIA SESSION SUMMARY
Course Elapsed Days: 50
Plan Fractions Treated to Date: 20
Plan Prescribed Dose Per Fraction: 2 Gy
Plan Total Fractions Prescribed: 33
Plan Total Prescribed Dose: 66 Gy
Reference Point Dosage Given to Date: 40 Gy
Reference Point Session Dosage Given: 2 Gy
Session Number: 20

## 2023-03-10 ENCOUNTER — Telehealth: Payer: Self-pay | Admitting: Radiation Therapy

## 2023-03-10 ENCOUNTER — Encounter: Payer: Self-pay | Admitting: Emergency Medicine

## 2023-03-10 ENCOUNTER — Ambulatory Visit: Payer: Medicare HMO

## 2023-03-10 NOTE — Telephone Encounter (Signed)
I received a voicemail from [name], informing us that there are a few pine trees down and covering the road in her neighborhood. She is hoping that these will be cleared in time for her to make it to treatment today.  I passed this along to her treatment therapist team.   Jasmine Long R.T.(R)(T) Radiation Special Procedures Lead

## 2023-03-10 NOTE — Telephone Encounter (Signed)
Thank you :)

## 2023-03-11 ENCOUNTER — Ambulatory Visit: Payer: Medicare HMO

## 2023-03-11 ENCOUNTER — Other Ambulatory Visit: Payer: Self-pay

## 2023-03-11 ENCOUNTER — Ambulatory Visit
Admission: RE | Admit: 2023-03-11 | Discharge: 2023-03-11 | Disposition: A | Discharge status: Home / Self Care | Payer: Medicare HMO | Source: Ambulatory Visit | Attending: Radiation Oncology

## 2023-03-11 DIAGNOSIS — Z510 Encounter for antineoplastic radiation therapy: Secondary | ICD-10-CM | POA: Diagnosis not present

## 2023-03-11 DIAGNOSIS — Z79899 Other long term (current) drug therapy: Secondary | ICD-10-CM | POA: Diagnosis not present

## 2023-03-11 DIAGNOSIS — C3412 Malignant neoplasm of upper lobe, left bronchus or lung: Secondary | ICD-10-CM | POA: Diagnosis not present

## 2023-03-11 DIAGNOSIS — C7931 Secondary malignant neoplasm of brain: Secondary | ICD-10-CM | POA: Diagnosis not present

## 2023-03-11 DIAGNOSIS — F17210 Nicotine dependence, cigarettes, uncomplicated: Secondary | ICD-10-CM | POA: Diagnosis not present

## 2023-03-11 DIAGNOSIS — Z5111 Encounter for antineoplastic chemotherapy: Secondary | ICD-10-CM | POA: Diagnosis not present

## 2023-03-11 LAB — RAD ONC ARIA SESSION SUMMARY
Course Elapsed Days: 52
Plan Fractions Treated to Date: 21
Plan Prescribed Dose Per Fraction: 2 Gy
Plan Total Fractions Prescribed: 33
Plan Total Prescribed Dose: 66 Gy
Reference Point Dosage Given to Date: 42 Gy
Reference Point Session Dosage Given: 2 Gy
Session Number: 21

## 2023-03-12 ENCOUNTER — Ambulatory Visit
Admission: RE | Admit: 2023-03-12 | Discharge: 2023-03-12 | Disposition: A | Discharge status: Home / Self Care | Payer: Medicare HMO | Source: Ambulatory Visit | Attending: Radiation Oncology | Admitting: Radiation Oncology

## 2023-03-12 ENCOUNTER — Other Ambulatory Visit: Payer: Self-pay

## 2023-03-12 ENCOUNTER — Ambulatory Visit: Payer: Medicare HMO

## 2023-03-12 DIAGNOSIS — Z5111 Encounter for antineoplastic chemotherapy: Secondary | ICD-10-CM | POA: Diagnosis not present

## 2023-03-12 DIAGNOSIS — Z79899 Other long term (current) drug therapy: Secondary | ICD-10-CM | POA: Diagnosis not present

## 2023-03-12 DIAGNOSIS — Z510 Encounter for antineoplastic radiation therapy: Secondary | ICD-10-CM | POA: Diagnosis not present

## 2023-03-12 DIAGNOSIS — F17210 Nicotine dependence, cigarettes, uncomplicated: Secondary | ICD-10-CM | POA: Diagnosis not present

## 2023-03-12 DIAGNOSIS — C7931 Secondary malignant neoplasm of brain: Secondary | ICD-10-CM | POA: Diagnosis not present

## 2023-03-12 DIAGNOSIS — C3412 Malignant neoplasm of upper lobe, left bronchus or lung: Secondary | ICD-10-CM | POA: Diagnosis not present

## 2023-03-12 LAB — RAD ONC ARIA SESSION SUMMARY
Course Elapsed Days: 53
Plan Fractions Treated to Date: 22
Plan Prescribed Dose Per Fraction: 2 Gy
Plan Total Fractions Prescribed: 33
Plan Total Prescribed Dose: 66 Gy
Reference Point Dosage Given to Date: 44 Gy
Reference Point Session Dosage Given: 2 Gy
Session Number: 22

## 2023-03-15 ENCOUNTER — Ambulatory Visit: Payer: Medicare HMO

## 2023-03-15 ENCOUNTER — Other Ambulatory Visit: Payer: Medicare HMO

## 2023-03-15 ENCOUNTER — Ambulatory Visit
Admission: RE | Admit: 2023-03-15 | Discharge: 2023-03-15 | Disposition: A | Discharge status: Home / Self Care | Payer: Medicare HMO | Source: Ambulatory Visit | Attending: Radiation Oncology

## 2023-03-15 ENCOUNTER — Telehealth: Payer: Self-pay | Admitting: Emergency Medicine

## 2023-03-15 ENCOUNTER — Telehealth: Payer: Self-pay

## 2023-03-15 ENCOUNTER — Other Ambulatory Visit: Payer: Self-pay

## 2023-03-15 DIAGNOSIS — C7931 Secondary malignant neoplasm of brain: Secondary | ICD-10-CM | POA: Diagnosis not present

## 2023-03-15 DIAGNOSIS — Z5111 Encounter for antineoplastic chemotherapy: Secondary | ICD-10-CM | POA: Diagnosis not present

## 2023-03-15 DIAGNOSIS — C3412 Malignant neoplasm of upper lobe, left bronchus or lung: Secondary | ICD-10-CM | POA: Diagnosis not present

## 2023-03-15 DIAGNOSIS — F17210 Nicotine dependence, cigarettes, uncomplicated: Secondary | ICD-10-CM | POA: Diagnosis not present

## 2023-03-15 DIAGNOSIS — Z79899 Other long term (current) drug therapy: Secondary | ICD-10-CM | POA: Diagnosis not present

## 2023-03-15 DIAGNOSIS — Z510 Encounter for antineoplastic radiation therapy: Secondary | ICD-10-CM | POA: Diagnosis not present

## 2023-03-15 LAB — RAD ONC ARIA SESSION SUMMARY
Course Elapsed Days: 56
Plan Fractions Treated to Date: 23
Plan Prescribed Dose Per Fraction: 2 Gy
Plan Total Fractions Prescribed: 33
Plan Total Prescribed Dose: 66 Gy
Reference Point Dosage Given to Date: 46 Gy
Reference Point Session Dosage Given: 2 Gy
Session Number: 23

## 2023-03-15 MED FILL — Fosaprepitant Dimeglumine For IV Infusion 150 MG (Base Eq): INTRAVENOUS | Qty: 5 | Status: AC

## 2023-03-15 NOTE — Telephone Encounter (Signed)
 Copied from CRM [id number]. Topic: Clinical - Medical Advice >> Mar 15, 2023  8:34 AM Almira Coaster wrote: Reason for CRM: Patient is calling regarding sinus infection symptoms she's experiencing, She would like Dr.Sagardia to send a prescription for antibiotics, advised patient that an appointment is required to diagnosed; however, patient is going through radiation for cancer treatment and is booked up this week and won't be able to schedule. She states Dr.Sagardia is aware and usually sends the prescription without an appointment.

## 2023-03-15 NOTE — Telephone Encounter (Signed)
 Spoke to her son about coming in at 69 on Wednesday 2/19 due to possible inclement weather starting at 1100.  Son will relay message to her as she is currently getting radiation. Lorayne Marek, RN

## 2023-03-16 ENCOUNTER — Inpatient Hospital Stay: Payer: Medicare HMO

## 2023-03-16 ENCOUNTER — Encounter: Payer: Self-pay | Admitting: Emergency Medicine

## 2023-03-16 ENCOUNTER — Ambulatory Visit (INDEPENDENT_AMBULATORY_CARE_PROVIDER_SITE_OTHER): Payer: Medicare HMO | Admitting: Emergency Medicine

## 2023-03-16 ENCOUNTER — Ambulatory Visit: Payer: Medicare HMO

## 2023-03-16 ENCOUNTER — Inpatient Hospital Stay: Payer: Medicare HMO | Admitting: Internal Medicine

## 2023-03-16 ENCOUNTER — Telehealth: Payer: Self-pay

## 2023-03-16 VITALS — BP 132/62 | HR 98 | Temp 98.4°F | Ht 69.0 in | Wt 167.0 lb

## 2023-03-16 DIAGNOSIS — Z7984 Long term (current) use of oral hypoglycemic drugs: Secondary | ICD-10-CM | POA: Diagnosis not present

## 2023-03-16 DIAGNOSIS — R300 Dysuria: Secondary | ICD-10-CM | POA: Diagnosis not present

## 2023-03-16 DIAGNOSIS — E785 Hyperlipidemia, unspecified: Secondary | ICD-10-CM | POA: Diagnosis not present

## 2023-03-16 DIAGNOSIS — C180 Malignant neoplasm of cecum: Secondary | ICD-10-CM

## 2023-03-16 DIAGNOSIS — F419 Anxiety disorder, unspecified: Secondary | ICD-10-CM

## 2023-03-16 DIAGNOSIS — J0101 Acute recurrent maxillary sinusitis: Secondary | ICD-10-CM | POA: Diagnosis not present

## 2023-03-16 DIAGNOSIS — C3412 Malignant neoplasm of upper lobe, left bronchus or lung: Secondary | ICD-10-CM | POA: Diagnosis not present

## 2023-03-16 DIAGNOSIS — E1169 Type 2 diabetes mellitus with other specified complication: Secondary | ICD-10-CM | POA: Diagnosis not present

## 2023-03-16 DIAGNOSIS — I10 Essential (primary) hypertension: Secondary | ICD-10-CM

## 2023-03-16 MED ORDER — DOXYCYCLINE HYCLATE 100 MG PO TABS: 100.0000 mg | ORAL_TABLET | Freq: Two times a day (BID) | ORAL | 0 refills | Status: DC

## 2023-03-16 MED ORDER — DOXYCYCLINE HYCLATE 100 MG PO TABS: 100.0000 mg | ORAL_TABLET | Freq: Two times a day (BID) | ORAL | 0 refills | Status: AC

## 2023-03-16 NOTE — Assessment & Plan Note (Signed)
 Clinically stable without complications Recurrent problem.  Responds well to doxycycline 100 mg twice a day New prescription sent today to pharmacy requested

## 2023-03-16 NOTE — Assessment & Plan Note (Signed)
 Chronic stable conditions Continue metformin 1000 mg twice a day and atorvastatin 20 mg daily

## 2023-03-16 NOTE — Assessment & Plan Note (Signed)
 Oncology note as follows: -This was discovered during her staging CT scan for newly diagnosed colon cancer.  It showed a 4.9 x 2.8 cm subpleural mass in the left upper lobe, and a new supraclavicular and mediastinal adenopathy -Biopsy of the left supraclavicular lymph node showed high-grade neuroendocrine tumor, favor small cell cancer, TTF-1 positive, consistent with lung primary. -PET scan showed hypermetabolic mediastinal and left supraclavicular nodal metastasis in addition to the large left upper lobe primary tumor invading the chest wall.  No other distant metastasis on the PET scan. -Unfortunately her brain MRI showed a 4 mm lesion in the right periatrial lesion, highly concerning for metastasis. -We discussed the chance of cure is minimal due to her significant local regional nodal metastasis and a small brain metastasis, however given her very limited brain metastasis, I recommend concurrent chemoradiation to her thorax, and brain radiation.  -I recommend chemotherapy with carboplatin, etoposide and atezolizumab every 3 weeks for 4 cycles, followed by maintenance atezolizumab. Started chemotherapy and radiation.  Tolerating treatment well.

## 2023-03-16 NOTE — Telephone Encounter (Signed)
 PT is seeing her PCP today . She cancelled all her appts for today and tomorrow.

## 2023-03-16 NOTE — Progress Notes (Signed)
 Jasmine Long 73 y.o.   Chief Complaint  Patient presents with   Cough    Patient states it started Friday, cough, headache, and a lot of drainage. No fever, no sore throat, no body aches. Pt also mentioned she has a bug bit her right arm that caused a lump     HISTORY OF PRESENT ILLNESS: This is a 73 y.o. female complaining of possible sinus infection that started several days ago Has history of chronic sinus infections.  Doxycycline helps Also noticed a little bump on her right forearm which is tender.  Possible bug bite. History of metastatic lung cancer.  Getting chemotherapy and radiation with good results.  No complications so far. No other complaints or medical concerns today.  Cough Pertinent negatives include no chest pain, chills, fever, headaches or rash.     Prior to Admission medications   Medication Sig Start Date End Date Taking? Authorizing Provider  ALPRAZolam Prudy Feeler) 0.5 MG tablet Take 1 tablet (0.5 mg total) by mouth 2 (two) times daily as needed for anxiety. 02/09/23  Yes Jaquita Bessire, Eilleen Kempf, MD  atorvastatin (LIPITOR) 20 MG tablet Take 1 tablet by mouth once daily 08/18/22  Yes Jocelynn Gioffre, Eilleen Kempf, MD  buPROPion (WELLBUTRIN XL) 300 MG 24 hr tablet Take 1 tablet by mouth once daily 08/18/22  Yes Cree Napoli, Eilleen Kempf, MD  busPIRone (BUSPAR) 7.5 MG tablet Take 1 tablet (7.5 mg total) by mouth 2 (two) times daily. 07/16/22  Yes Colie Josten, Eilleen Kempf, MD  gabapentin (NEURONTIN) 300 MG capsule Take 300 mg by mouth as needed (numbness). 08/14/22  Yes [provider]  levothyroxine (SYNTHROID) 125 MCG tablet TAKE 1 TABLET BY MOUTH ONCE DAILY BEFORE BREAKFAST 04/14/22  Yes Virgen Belland, Eilleen Kempf, MD  lidocaine (LIDODERM) 5 % Place 1 patch onto the skin daily. Remove & Discard patch within 12 hours or as directed by MD 01/12/23  Yes Loetta Rough, MD  lidocaine-prilocaine (EMLA) cream Apply 1 Application topically as needed. 02/04/23  Yes Heilingoetter, Cassandra L, PA-C   metFORMIN (GLUCOPHAGE) 1000 MG tablet TAKE 1 TABLET BY MOUTH TWICE DAILY WITH A MEAL 08/18/22  Yes Leonardo Plaia, Eilleen Kempf, MD  metoprolol succinate (TOPROL-XL) 25 MG 24 hr tablet Take 1 tablet by mouth once daily 05/27/22  Yes Janthony Holleman, [place], MD  nicotine (NICODERM CQ) 21 mg/24hr patch Place 1 patch (21 mg total) onto the skin daily. 11/26/22  Yes Brycelyn Gambino, Eilleen Kempf, MD  ondansetron (ZOFRAN) 8 MG tablet Take 1 tablet (8 mg total) by mouth every 8 (eight) hours as needed for nausea or vomiting. 01/15/23  Yes Bruning, Ashlyn, PA-C  pantoprazole (PROTONIX) 40 MG tablet Take 1 tablet twice a day for 2 weeks and then daily after that 08/08/22  Yes Esteen Delpriore, Eilleen Kempf, MD  prochlorperazine (COMPAZINE) 10 MG tablet Take 1 tablet (10 mg total) by mouth every 6 (six) hours as needed for nausea or vomiting (Nausea or vomiting). 01/18/23  Yes Si Gaul, MD  valsartan-hydrochlorothiazide (DIOVAN-HCT) 160-12.5 MG tablet Take 1 tablet by mouth once daily 05/27/22  Yes Nigeria Lasseter, Eilleen Kempf, MD  doxycycline (VIBRA-TABS) 100 MG tablet Take 1 tablet (100 mg total) by mouth 2 (two) times daily for 7 days. 03/16/23 03/23/23  [name], MD  escitalopram (LEXAPRO) 10 MG tablet Take 1 tablet (10 mg total) by mouth daily. 12/25/21 12/20/22  [name], MD  ondansetron (ZOFRAN) 8 MG tablet Take 1 tablet (8 mg total) by mouth every 8 (eight) hours as needed for nausea  or vomiting. Start on third day after chemotherapy. 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   Weight loss 05/13/2022   Change  in multiple pigmented skin lesions 04/22/2022   Paresthesia of both lower extremities 04/22/2022   Abnormal MRI, lumbar spine 04/22/2022   Vitreous floaters of right eye 04/07/2022   Hematoma of scalp 12/25/2021   Smokers' cough (HCC) 12/26/2020   Chronic bilateral low back pain with left-sided sciatica 12/26/2020   Chronic sinus complaints 12/26/2020   Chronic anxiety 06/28/2019   History of peptic ulcer 04/30/2017   Hyperlipidemia 12/11/2015   Insomnia 12/11/2015   Panic attack as reaction to stress 12/11/2015   Current smoker 12/11/2015   Chronic sinusitis 12/11/2015   Dyslipidemia associated with type 2 diabetes mellitus (HCC) 05/15/2015   Hypothyroid 04/29/2011   BMI 40.0-44.9, adult (HCC) 04/29/2011   Depression with anxiety 04/29/2011   GERD (gastroesophageal reflux disease) 04/29/2011   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   Nicotine addiction 04/29/2011   Essential hypertension 04/29/2011   Heart murmur 04/29/2011    Past Medical History:  Diagnosis Date   Allergy    Anxiety    Depression    Diabetes mellitus without complication (HCC)    Heart murmur    Hypertension    Thyroid disease    Ulcer     Past Surgical History:  Procedure Laterality Date   BREAST SURGERY     CESAREAN SECTION     CHOLECYSTECTOMY     COLONOSCOPY WITH PROPOFOL N/A 10/30/2022   Procedure: COLONOSCOPY WITH PROPOFOL;  Surgeon: Jeani Hawking, MD;  Location: [facility];  Service: Gastroenterology;  Laterality: N/A;   ENDOSCOPIC MUCOSAL RESECTION  10/30/2022   Procedure: ENDOSCOPIC MUCOSAL RESECTION;  Surgeon: Jeani Hawking, MD;  Location: [facility];  Service: Gastroenterology;;   HEMOSTASIS CLIP PLACEMENT  10/30/2022   Procedure: HEMOSTASIS CLIP PLACEMENT;  Surgeon: Jeani Hawking, MD;  Location: [facility];  Service: Gastroenterology;;   NASAL SEPTUM SURGERY     POLYPECTOMY  10/30/2022   Procedure: POLYPECTOMY;  Surgeon: Jeani Hawking, MD;  Location: [facility];  Service:  Gastroenterology;;   Sunnie Nielsen LIFTING INJECTION  10/30/2022   Procedure: SUBMUCOSAL LIFTING INJECTION;  Surgeon: Jeani Hawking, MD;  Location: [facility];  Service: Gastroenterology;;    Social History   Socioeconomic History   Marital status: Divorced    Spouse name: Not on file   Number of children: 2   Years of education: Not on file   Highest education level: Bachelor's degree (e.g., BA, AB, BS)  Occupational History   Occupation: Unemployed  Tobacco Use   Smoking status: Every Day    Current packs/day: 0.00    Average packs/day: 1.5 packs/day for 40.0 years (60.0 ttl pk-yrs)    Types: Cigarettes    Start date: 01/20/1972    Last attempt to quit: 01/20/2012    Years since quitting: 11.1   Smokeless tobacco: Never   Tobacco comments:    Pt doing E-cigarette as of 01/20/12  Vaping Use   Vaping status: Former  Substance and Sexual Activity   Alcohol use: No  Drug use: No   Sexual activity: Never  Other Topics Concern   Not on file  Social History Narrative   Patient on worker's compensation for an injury sustained on the job 5 years ago.      Right handed and Left Handed       Lives in a two story home       One son deceased.    Social Drivers of Corporate investment banker Strain: Low Risk  (01/09/2023)   Overall Financial Resource Strain (CARDIA)    Difficulty of Paying Living Expenses: Not hard at all  Food Insecurity: No Food Insecurity (01/12/2023)   Hunger Vital Sign    Worried About Running Out of Food in the Last Year: Never true    Ran Out of Food in the Last Year: Never true  Transportation Needs: No Transportation Needs (01/12/2023)   PRAPARE - Administrator, Civil Service (Medical): No    Lack of Transportation (Non-Medical): No  Physical Activity: Inactive (01/09/2023)   Exercise Vital Sign    Days of Exercise per Week: 0 days    Minutes of Exercise per Session: 0 min  Stress: Stress Concern Present (01/09/2023)   [employer] of Occupational Health - Occupational Stress Questionnaire    Feeling of Stress : Very much  Social Connections: Socially Isolated (01/09/2023)   Social Connection and Isolation Panel [NHANES]    Frequency of Communication with Friends and Family: Twice a week    Frequency of Social Gatherings with Friends and Family: Never    Attends Religious Services: Never    Database administrator or Organizations: No    Attends Banker Meetings: Never    Marital Status: Divorced  Catering manager Violence: Not At Risk (01/12/2023)   Humiliation, Afraid, Rape, and Kick questionnaire    Fear of Current or Ex-Partner: No    Emotionally Abused: No    Physically Abused: No    Sexually Abused: No    Family History  Problem Relation Age of Onset   Lung cancer Mother 31   Dementia Father    Atrial fibrillation Father    Cancer Paternal Aunt        breast cancer   Colon cancer Paternal Aunt    Colon cancer Cousin      Review of Systems  Constitutional: Negative.  Negative for chills and fever.  HENT:  Positive for congestion and sinus pain.   Respiratory:  Positive for cough.   Cardiovascular: Negative.  Negative for chest pain and palpitations.  Gastrointestinal:  Negative for abdominal pain, diarrhea, nausea and vomiting.  Genitourinary:  Positive for dysuria. Negative for hematuria.  Skin: Negative.  Negative for rash.  Neurological: Negative.  Negative for dizziness and headaches.  All other systems reviewed and are negative.   Vitals:   03/16/23 1502  BP: 132/62  Pulse: 98  Temp: 98.4 F (36.9 C)  SpO2: 98%    Physical Exam Vitals reviewed.  Constitutional:      Appearance: Normal appearance.  HENT:     Head: Normocephalic.     Nose: Congestion present.     Mouth/Throat:     Mouth: Mucous membranes are moist.     Pharynx: Oropharynx is clear.  Eyes:     Extraocular Movements: Extraocular movements intact.     Pupils: Pupils are equal, round, and  reactive to light.  Cardiovascular:     Rate and Rhythm: Normal rate and regular rhythm.  Pulses: Normal pulses.     Heart sounds: Normal heart sounds.  Pulmonary:     Effort: Pulmonary effort is normal.     Breath sounds: Normal breath sounds.  Musculoskeletal:     Cervical back: No tenderness.  Lymphadenopathy:     Cervical: No cervical adenopathy.  Skin:    General: Skin is warm and dry.  Neurological:     Mental Status: She is alert and oriented to person, place, and time.  Psychiatric:        Mood and Affect: Mood normal.        Behavior: Behavior normal.      ASSESSMENT & PLAN: A total of 44 minutes was spent with the patient and counseling/coordination of care regarding preparing for this visit, review of most recent office visit notes, review of multiple chronic medical conditions and their management, acute sinus infection and need for antibiotic, review of all medications, review of most recent bloodwork results, review of health maintenance items, education on nutrition, prognosis, documentation, and need for follow up.   Problem List Items Addressed This Visit       Cardiovascular and Mediastinum   Essential hypertension   Well-controlled hyper tension Continue valsartan HCT 160-12.5 mg daily Also on metoprolol succinate 25 mg daily BP Readings from Last 3 Encounters:  03/16/23 132/62  03/04/23 (!) 153/65  02/25/23 (!) 167/55           Respiratory   Acute recurrent maxillary sinusitis - Primary   Clinically stable without complications Recurrent problem.  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  It showed a 4.9 x 2.8 cm subpleural mass in the left upper lobe, and a new supraclavicular  and mediastinal adenopathy -Biopsy of the left supraclavicular lymph node showed high-grade neuroendocrine tumor, favor small cell cancer, TTF-1 positive, consistent with lung primary. -PET scan showed hypermetabolic mediastinal and left supraclavicular nodal metastasis in addition to the large left upper lobe primary tumor invading the chest wall.  No other distant metastasis on the PET scan. -Unfortunately her brain MRI showed a 4 mm lesion in the right periatrial lesion, highly concerning for metastasis. -We discussed the chance of cure is minimal due to her significant local regional nodal metastasis and a small brain metastasis, however given her very limited brain metastasis, I recommend concurrent chemoradiation to her thorax, and brain radiation.  -I recommend chemotherapy with carboplatin, etoposide and atezolizumab every 3 weeks for 4 cycles, followed by maintenance atezolizumab. Started chemotherapy and radiation.  Tolerating treatment well.      Relevant Medications   doxycycline (VIBRA-TABS) 100 MG tablet     Digestive   Adenocarcinoma of cecum (HCC)   Treatment on hold given metastatic lung cancer diagnosis and need for treatment       Relevant Medications   doxycycline (VIBRA-TABS) 100 MG tablet     Endocrine   Dyslipidemia associated with type 2 diabetes mellitus (HCC)   Chronic stable conditions Continue metformin 1000 mg twice a day and atorvastatin 20 mg daily          Other   Chronic anxiety   Clinically stable. Continues daily Lexapro 10 mg and BuSpar 7.5 mg twice a day. Alprazolam as needed.  Dysuria   Relevant Orders   Urine Culture   Urinalysis   Patient Instructions  Sinus Infection, Adult A sinus infection is soreness and swelling (inflammation) of your sinuses. Sinuses are hollow spaces in the bones around your face. They are located: Around your eyes. In the middle of your forehead. Behind your nose. In your cheekbones. Your sinuses and  nasal passages are lined with a fluid called mucus. Mucus drains out of your sinuses. Swelling can trap mucus in your sinuses. This lets germs (bacteria, virus, or fungus) grow, which leads to infection. Most of the time, this condition is caused by a virus. What are the causes? Allergies. Asthma. Germs. Things that block your nose or sinuses. Growths in the nose (nasal polyps). Chemicals or irritants in the air. A fungus. This is rare. What increases the risk? Having a weak body defense system (immune system). Doing a lot of swimming or diving. Using nasal sprays too much. Smoking. What are the signs or symptoms? The main symptoms of this condition are pain and a feeling of pressure around the sinuses. Other symptoms include: Stuffy nose (congestion). This may make it hard to breathe through your nose. Runny nose (drainage). Soreness, swelling, and warmth in the sinuses. A cough that may get worse at night. Being unable to smell and taste. Mucus that collects in the throat or the back of the nose (postnasal drip). This may cause a sore throat or bad breath. Being very tired (fatigued). A fever. How is this diagnosed? Your symptoms. Your medical history. A physical exam. Tests to find out if your condition is short-term (acute) or long-term (chronic). Your doctor may: Check your nose for growths (polyps). Check your sinuses using a tool that has a light on one end (endoscope). Check for allergies or germs. Do imaging tests, such as an MRI or CT scan. How is this treated? Treatment for this condition depends on the cause and whether it is short-term or long-term. If caused by a virus, your symptoms should go away on their own within 10 days. You may be given medicines to relieve symptoms. They include: Medicines that shrink swollen tissue in the nose. A spray that treats swelling of the nostrils. Rinses that help get rid of thick mucus in your nose (nasal saline  washes). Medicines that treat allergies (antihistamines). Over-the-counter pain relievers. If caused by bacteria, your doctor may wait to see if you will get better without treatment. You may be given antibiotic medicine if you have: A very bad infection. A weak body defense system. If caused by growths in the nose, surgery may be needed. Follow these instructions at home: Medicines Take, use, or apply over-the-counter and prescription medicines only as told by your doctor. These may include nasal sprays. If you were prescribed an antibiotic medicine, take it as told by your doctor. Do not stop taking it even if you start to feel better. Hydrate and humidify  Drink enough water to keep your pee (urine) pale yellow. Use a cool mist humidifier to keep the humidity level in your home above 50%. Breathe in steam for 10-15 minutes, 3-4 times a day, or as told by your doctor. You can do this in the bathroom while a hot shower is running. Try not to spend time in cool or dry air. Rest Rest as much as you can. Sleep with your head raised (elevated). Make sure you get enough sleep each night. General instructions  Put a warm, moist washcloth on your face 3-4  times a day, or as often as told by your doctor. Use nasal saline washes as often as told by your doctor. Wash your hands often with soap and water. If you cannot use soap and water, use hand sanitizer. Do not smoke. Avoid being around people who are smoking (secondhand smoke). Keep all follow-up visits. Contact a doctor if: You have a fever. Your symptoms get worse. Your symptoms do not get better within 10 days. Get help right away if: You have a very bad headache. You cannot stop vomiting. You have very bad pain or swelling around your face or eyes. You have trouble seeing. You feel confused. Your neck is stiff. You have trouble breathing. These symptoms may be an emergency. Get help right away. Call 911. Do not wait to see if  the symptoms will go away. Do not drive yourself to the hospital. Summary A sinus infection is swelling of your sinuses. Sinuses are hollow spaces in the bones around your face. This condition is caused by tissues in your nose that become inflamed or swollen. This traps germs. These can lead to infection. If you were prescribed an antibiotic medicine, take it as told by your doctor. Do not stop taking it even if you start to feel better. Keep all follow-up visits. This information is not intended to replace advice given to you by your health care provider. Make sure you discuss any questions you have with your health care provider. Document Revised: 12/17/2020 Document Reviewed: 12/17/2020 Elsevier Patient Education  2024 Elsevier Inc.     Edwina Barth, MD [facility]

## 2023-03-16 NOTE — Telephone Encounter (Addendum)
 Pt has a sinus infection and a low grade fever. She is unable to come in on 03/16/2023 to her appointments and needs to be rescheduled. Pt also has questions about her CT scan with contrast. She says that if you would like to leave a message on her phone, please contact her through her home number. Her cell phone does not have voicemail set up.

## 2023-03-16 NOTE — Progress Notes (Signed)
 The pt called me at 6:07am and left a VM which stated that she has a "severe sinus infection" and has a "low grade temp, 99 something" She is going to try to schedule a same day with her PCP to get an antibiotic and will call me later.  8:52 I called the pt back and saw that she was able to get an appt at 3pm. I suggested urgent care but pt states she'd rather go to a doctor who knows her, and was able to specify her temperature being 99.8.  During this conversation, pt received a call on her home phone from Dr Pollie Friar desk RN, Vincent Peyer, who is also now aware of the pts illness. Waiting for next steps from oncology team at this time.  Pt asked if the CT she needs to get for Cassie can be done at the same time Dr Kathrynn Running needs one. I told her I would discuss this with Dr Arbutus Ped today and call her back later.

## 2023-03-16 NOTE — Assessment & Plan Note (Signed)
 Well-controlled hyper tension Continue valsartan HCT 160-12.5 mg daily Also on metoprolol succinate 25 mg daily BP Readings from Last 3 Encounters:  03/16/23 132/62  03/04/23 (!) 153/65  02/25/23 (!) 167/55

## 2023-03-16 NOTE — Patient Instructions (Signed)

## 2023-03-16 NOTE — Assessment & Plan Note (Signed)
 Treatment on hold given metastatic lung cancer diagnosis and need for treatment

## 2023-03-16 NOTE — Assessment & Plan Note (Signed)
 Clinically stable. Continues daily Lexapro 10 mg and BuSpar 7.5 mg twice a day. Alprazolam as needed.

## 2023-03-17 ENCOUNTER — Telehealth: Payer: Self-pay | Admitting: Radiation Oncology

## 2023-03-17 ENCOUNTER — Inpatient Hospital Stay: Payer: Medicare HMO

## 2023-03-17 ENCOUNTER — Ambulatory Visit: Payer: Medicare HMO

## 2023-03-17 NOTE — Telephone Encounter (Signed)
 2/19 Received secure chat from Alejandro Mulling, RN.  Patient would like to cancel her treatment appointment for today.  Email sent to Support RTT and copied L4 machine, so they are aware.

## 2023-03-18 ENCOUNTER — Ambulatory Visit: Payer: Medicare HMO

## 2023-03-19 ENCOUNTER — Encounter: Payer: Self-pay | Admitting: Internal Medicine

## 2023-03-19 ENCOUNTER — Ambulatory Visit: Payer: Medicare HMO

## 2023-03-22 ENCOUNTER — Ambulatory Visit
Admission: RE | Admit: 2023-03-22 | Discharge: 2023-03-22 | Disposition: A | Discharge status: Home / Self Care | Payer: Medicare HMO | Source: Ambulatory Visit | Attending: Radiation Oncology

## 2023-03-22 ENCOUNTER — Other Ambulatory Visit: Payer: Self-pay

## 2023-03-22 ENCOUNTER — Ambulatory Visit: Payer: Medicare HMO

## 2023-03-22 ENCOUNTER — Other Ambulatory Visit: Payer: Medicare HMO

## 2023-03-22 DIAGNOSIS — C7931 Secondary malignant neoplasm of brain: Secondary | ICD-10-CM | POA: Diagnosis not present

## 2023-03-22 DIAGNOSIS — C3412 Malignant neoplasm of upper lobe, left bronchus or lung: Secondary | ICD-10-CM | POA: Diagnosis not present

## 2023-03-22 DIAGNOSIS — F17210 Nicotine dependence, cigarettes, uncomplicated: Secondary | ICD-10-CM | POA: Diagnosis not present

## 2023-03-22 DIAGNOSIS — Z79899 Other long term (current) drug therapy: Secondary | ICD-10-CM | POA: Diagnosis not present

## 2023-03-22 DIAGNOSIS — Z510 Encounter for antineoplastic radiation therapy: Secondary | ICD-10-CM | POA: Diagnosis not present

## 2023-03-22 DIAGNOSIS — Z5111 Encounter for antineoplastic chemotherapy: Secondary | ICD-10-CM | POA: Diagnosis not present

## 2023-03-22 LAB — RAD ONC ARIA SESSION SUMMARY
Course Elapsed Days: 63
Plan Fractions Treated to Date: 24
Plan Prescribed Dose Per Fraction: 2 Gy
Plan Total Fractions Prescribed: 33
Plan Total Prescribed Dose: 66 Gy
Reference Point Dosage Given to Date: 48 Gy
Reference Point Session Dosage Given: 2 Gy
Session Number: 24

## 2023-03-22 MED FILL — Fosaprepitant Dimeglumine For IV Infusion 150 MG (Base Eq): INTRAVENOUS | Qty: 5 | Status: AC

## 2023-03-23 ENCOUNTER — Ambulatory Visit
Admission: RE | Admit: 2023-03-23 | Discharge: 2023-03-23 | Disposition: A | Discharge status: Home / Self Care | Payer: Medicare HMO | Source: Ambulatory Visit | Attending: Radiation Oncology

## 2023-03-23 ENCOUNTER — Inpatient Hospital Stay (HOSPITAL_BASED_OUTPATIENT_CLINIC_OR_DEPARTMENT_OTHER): Payer: Medicare HMO | Admitting: Internal Medicine

## 2023-03-23 ENCOUNTER — Inpatient Hospital Stay: Payer: Medicare HMO

## 2023-03-23 ENCOUNTER — Other Ambulatory Visit: Payer: Self-pay

## 2023-03-23 ENCOUNTER — Encounter: Payer: Self-pay | Admitting: Internal Medicine

## 2023-03-23 ENCOUNTER — Ambulatory Visit: Payer: Medicare HMO

## 2023-03-23 VITALS — BP 150/63 | HR 84 | Resp 20

## 2023-03-23 VITALS — BP 144/61 | HR 89 | Temp 97.8°F | Resp 18 | Wt 169.9 lb

## 2023-03-23 DIAGNOSIS — C7931 Secondary malignant neoplasm of brain: Secondary | ICD-10-CM | POA: Diagnosis not present

## 2023-03-23 DIAGNOSIS — Z79899 Other long term (current) drug therapy: Secondary | ICD-10-CM | POA: Diagnosis not present

## 2023-03-23 DIAGNOSIS — Z5111 Encounter for antineoplastic chemotherapy: Secondary | ICD-10-CM | POA: Diagnosis not present

## 2023-03-23 DIAGNOSIS — Z510 Encounter for antineoplastic radiation therapy: Secondary | ICD-10-CM | POA: Diagnosis not present

## 2023-03-23 DIAGNOSIS — C3412 Malignant neoplasm of upper lobe, left bronchus or lung: Secondary | ICD-10-CM

## 2023-03-23 DIAGNOSIS — F17210 Nicotine dependence, cigarettes, uncomplicated: Secondary | ICD-10-CM | POA: Diagnosis not present

## 2023-03-23 LAB — RAD ONC ARIA SESSION SUMMARY
Course Elapsed Days: 64
Plan Fractions Treated to Date: 25
Plan Prescribed Dose Per Fraction: 2 Gy
Plan Total Fractions Prescribed: 33
Plan Total Prescribed Dose: 66 Gy
Reference Point Dosage Given to Date: 50 Gy
Reference Point Session Dosage Given: 2 Gy
Session Number: 25

## 2023-03-23 LAB — CBC WITH DIFFERENTIAL (CANCER CENTER ONLY)
Abs Immature Granulocytes: 0.02 10*3/uL (ref 0.00–0.07)
Basophils Absolute: 0 10*3/uL (ref 0.0–0.1)
Basophils Relative: 0 %
Eosinophils Absolute: 0.1 10*3/uL (ref 0.0–0.5)
Eosinophils Relative: 1 %
HCT: 31.6 % — ABNORMAL LOW (ref 36.0–46.0)
Hemoglobin: 10.4 g/dL — ABNORMAL LOW (ref 12.0–15.0)
Immature Granulocytes: 0 %
Lymphocytes Relative: 17 %
Lymphs Abs: 0.9 10*3/uL (ref 0.7–4.0)
MCH: 29.6 pg (ref 26.0–34.0)
MCHC: 32.9 g/dL (ref 30.0–36.0)
MCV: 90 fL (ref 80.0–100.0)
Monocytes Absolute: 0.6 10*3/uL (ref 0.1–1.0)
Monocytes Relative: 12 %
Neutro Abs: 3.5 10*3/uL (ref 1.7–7.7)
Neutrophils Relative %: 70 %
Platelet Count: 465 10*3/uL — ABNORMAL HIGH (ref 150–400)
RBC: 3.51 MIL/uL — ABNORMAL LOW (ref 3.87–5.11)
RDW: 17.7 % — ABNORMAL HIGH (ref 11.5–15.5)
WBC Count: 5.1 10*3/uL (ref 4.0–10.5)
nRBC: 0 % (ref 0.0–0.2)

## 2023-03-23 LAB — CMP (CANCER CENTER ONLY)
ALT: 9 U/L (ref 0–44)
AST: 10 U/L — ABNORMAL LOW (ref 15–41)
Albumin: 3.9 g/dL (ref 3.5–5.0)
Alkaline Phosphatase: 88 U/L (ref 38–126)
Anion gap: 7 (ref 5–15)
BUN: 21 mg/dL (ref 8–23)
CO2: 26 mmol/L (ref 22–32)
Calcium: 9.6 mg/dL (ref 8.9–10.3)
Chloride: 105 mmol/L (ref 98–111)
Creatinine: 0.88 mg/dL (ref 0.44–1.00)
GFR, Estimated: >60 mL/min (ref >60)
Glucose, Bld: 92 mg/dL (ref 70–99)
Potassium: 4.5 mmol/L (ref 3.5–5.1)
Sodium: 138 mmol/L (ref 135–145)
Total Bilirubin: 0.4 mg/dL (ref 0.0–1.2)
Total Protein: 7.2 g/dL (ref 6.5–8.1)

## 2023-03-23 MED ORDER — SODIUM CHLORIDE 0.9 % IV SOLN
150.0000 mg | Freq: Once | INTRAVENOUS | Status: AC
  Administered 2023-03-23: 150 mg via INTRAVENOUS
  Filled 2023-03-23: qty 150
  Filled 2023-03-23: qty 5

## 2023-03-23 MED ORDER — SODIUM CHLORIDE 0.9 % IV SOLN
443.0000 mg | Freq: Once | INTRAVENOUS | Status: AC
  Administered 2023-03-23: 440 mg via INTRAVENOUS
  Filled 2023-03-23: qty 44

## 2023-03-23 MED ORDER — DEXAMETHASONE SODIUM PHOSPHATE 10 MG/ML IJ SOLN
10.0000 mg | Freq: Once | INTRAMUSCULAR | Status: AC
  Administered 2023-03-23: 10 mg via INTRAVENOUS
  Filled 2023-03-23: qty 1

## 2023-03-23 MED ORDER — SODIUM CHLORIDE 0.9 % IV SOLN: INTRAVENOUS | Status: DC

## 2023-03-23 MED ORDER — PALONOSETRON HCL INJECTION 0.25 MG/5ML
0.2500 mg | Freq: Once | INTRAVENOUS | Status: AC
  Administered 2023-03-23: 0.25 mg via INTRAVENOUS
  Filled 2023-03-23: qty 5

## 2023-03-23 MED ORDER — SODIUM CHLORIDE 0.9 % IV SOLN
100.0000 mg/m2 | Freq: Once | INTRAVENOUS | Status: AC
  Administered 2023-03-23: 200 mg via INTRAVENOUS
  Filled 2023-03-23: qty 10

## 2023-03-23 NOTE — Patient Instructions (Signed)
 [facility] - A DEPT OF [facility]  Discharge Instructions: Thank you for choosing [facility] to provide your oncology and hematology care.   If you have a lab appointment with the Cancer Center, please go directly to the Cancer Center and check in at the registration area.   Wear comfortable clothing and clothing appropriate for easy access to any Portacath or PICC line.   We strive to give you quality time with your provider. You may need to reschedule your appointment if you arrive late (15 or more minutes).  Arriving late affects you and other patients whose appointments are after yours.  Also, if you miss three or more appointments without notifying the office, you may be dismissed from the clinic at the provider's discretion.      For prescription refill requests, have your pharmacy contact our office and allow 72 hours for refills to be completed.    Today you received the following chemotherapy and/or immunotherapy agents: Etoposide and Carboplatin      To help prevent nausea and vomiting after your treatment, we encourage you to take your nausea medication as directed.  BELOW ARE SYMPTOMS THAT SHOULD BE REPORTED IMMEDIATELY: *FEVER GREATER THAN 100.4 F (38 C) OR HIGHER *CHILLS OR SWEATING *NAUSEA AND VOMITING THAT IS NOT CONTROLLED WITH YOUR NAUSEA MEDICATION *UNUSUAL SHORTNESS OF BREATH *UNUSUAL BRUISING OR BLEEDING *URINARY PROBLEMS (pain or burning when urinating, or frequent urination) *BOWEL PROBLEMS (unusual diarrhea, constipation, pain near the anus) TENDERNESS IN MOUTH AND THROAT WITH OR WITHOUT PRESENCE OF ULCERS (sore throat, sores in mouth, or a toothache) UNUSUAL RASH, SWELLING OR PAIN  UNUSUAL VAGINAL DISCHARGE OR ITCHING   Items with * indicate a potential emergency and should be followed up as soon as possible or go to the Emergency Department if any problems should occur.  Please show the CHEMOTHERAPY ALERT CARD  or IMMUNOTHERAPY ALERT CARD at check-in to the Emergency Department and triage nurse.  Should you have questions after your visit or need to cancel or reschedule your appointment, please contact [facility] - A DEPT OF [facility]  Dept: [phone]  and follow the prompts.  Office hours are 8:00 a.m. to 4:30 p.m. Monday - Friday. Please note that voicemails left after 4:00 p.m. may not be returned until the following business day.  We are closed weekends and major holidays. You have access to a nurse at all times for urgent questions. Please call the main number to the clinic Dept: [phone] and follow the prompts.   For any non-urgent questions, you may also contact your provider using MyChart. We now offer e-Visits for anyone 3 and older to request care online for non-urgent symptoms. For details visit [email or web address].   Also download the MyChart app! Go to the app store, search "MyChart", open the app, select [place], and log in with your MyChart username and password.

## 2023-03-23 NOTE — Progress Notes (Signed)
 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    PRIOR THERAPY: SRS to solitary brain metastasis under the care of Dr. Kathrynn Running   CURRENT THERAPY:  1) of systemic chemotherapy with carboplatin for an AUC 5 and etoposide 100 mg/m on days 1, 2, and 3 IV every 3 weeks.  Imfinzi will be added once she completes palliative radiation.  Status post 2 cycles.  This is concurrent with radiotherapy expected to be completed on April 05, 2023.   INTERVAL HISTORY: Jasmine Long 73 y.o. female returns to the clinic today for follow-up visit.Discussed the use of AI scribe software for clinical note transcription with the patient, who gave verbal consent to proceed.  History of Present Illness   Jasmine Long is a 73 year old female with extensive stage small cell lung cancer who presents for chemotherapy cycle three.  She was diagnosed with extensive stage small cell lung cancer in November, with involvement of left lung, mediastinal lymph nodes, and metastasis to the brain. She has been receiving treatment with carboplatin and etoposide chemotherapy, along with concurrent radiation therapy. She has completed two cycles of chemotherapy and is starting her third cycle today.  For the brain metastasis, she underwent stereotactic radiosurgery [facility]) and the tumor is reportedly shrinking. Her last MRI was favorable, and she will continue to have MRIs  every few months to monitor the situation.  She experiences hair loss as a side effect of the chemotherapy but is not concerned about it. No nausea, vomiting, or diarrhea. She mentions a past episode of diverticulitis, during which she lost 10-12 pounds, but she is regaining the weight.       MEDICAL HISTORY: Past Medical History:  Diagnosis Date   Allergy    Anxiety    Depression    Diabetes mellitus without complication (HCC)    Heart murmur    Hypertension    Thyroid disease    Ulcer     ALLERGIES:  has no known allergies.  MEDICATIONS:  Current Outpatient Medications  Medication Sig Dispense Refill   ALPRAZolam (XANAX) 0.5 MG tablet Take 1 tablet (0.5 mg total) by mouth 2 (two) times daily as needed for anxiety. 30 tablet 3   atorvastatin (LIPITOR) 20 MG tablet Take 1 tablet by mouth once daily 90 tablet 1   buPROPion (WELLBUTRIN XL) 300 MG 24 hr tablet Take 1 tablet by mouth once daily 90 tablet 1   busPIRone (BUSPAR) 7.5 MG tablet Take 1 tablet (7.5 mg total) by mouth 2 (two) times daily. 60 tablet 1   doxycycline (VIBRA-TABS) 100 MG tablet Take 1 tablet (100 mg total) by mouth 2 (two) times daily for 7 days. 14 tablet 0   escitalopram (LEXAPRO) 10 MG tablet Take 1 tablet (10 mg total) by mouth daily. 90 tablet 3   gabapentin (NEURONTIN) 300 MG capsule Take 300 mg by mouth as needed (numbness).     levothyroxine (SYNTHROID) 125 MCG tablet TAKE  1 TABLET BY MOUTH ONCE DAILY BEFORE BREAKFAST 90 tablet 0   lidocaine (LIDODERM) 5 % Place 1 patch onto the skin daily. Remove & Discard patch within 12 hours or as directed by MD 30 patch 0   lidocaine-prilocaine (EMLA) cream Apply 1 Application topically as needed. 30 g 2   metFORMIN (GLUCOPHAGE) 1000 MG tablet TAKE 1 TABLET BY MOUTH TWICE DAILY WITH A MEAL 180 tablet 1   metoprolol succinate (TOPROL-XL) 25 MG 24 hr tablet Take 1 tablet by mouth once daily 90 tablet 3   nicotine (NICODERM CQ) 21 mg/24hr patch Place 1 patch (21 mg  total) onto the skin daily. 28 patch 3   ondansetron (ZOFRAN) 8 MG tablet Take 1 tablet (8 mg total) by mouth every 8 (eight) hours as needed for nausea or vomiting. 30 tablet 1   pantoprazole (PROTONIX) 40 MG tablet Take 1 tablet twice a day for 2 weeks and then daily after that 90 tablet 3   prochlorperazine (COMPAZINE) 10 MG tablet Take 1 tablet (10 mg total) by mouth every 6 (six) hours as needed for nausea or vomiting (Nausea or vomiting). 30 tablet 1   valsartan-hydrochlorothiazide (DIOVAN-HCT) 160-12.5 MG tablet Take 1 tablet by mouth once daily 90 tablet 3   No current facility-administered medications for this visit.    SURGICAL HISTORY:  Past Surgical History:  Procedure Laterality Date   BREAST SURGERY     CESAREAN SECTION     CHOLECYSTECTOMY     COLONOSCOPY WITH PROPOFOL N/A 10/30/2022   Procedure: COLONOSCOPY WITH PROPOFOL;  Surgeon: Jeani Hawking, MD;  Location: [facility];  Service: Gastroenterology;  Laterality: N/A;   ENDOSCOPIC MUCOSAL RESECTION  10/30/2022   Procedure: ENDOSCOPIC MUCOSAL RESECTION;  Surgeon: Jeani Hawking, MD;  Location: [facility];  Service: Gastroenterology;;   HEMOSTASIS CLIP PLACEMENT  10/30/2022   Procedure: HEMOSTASIS CLIP PLACEMENT;  Surgeon: Jeani Hawking, MD;  Location: [facility];  Service: Gastroenterology;;   NASAL SEPTUM SURGERY     POLYPECTOMY  10/30/2022   Procedure: POLYPECTOMY;  Surgeon: Jeani Hawking, MD;  Location: [facility];  Service: Gastroenterology;;   Sunnie Nielsen LIFTING INJECTION  10/30/2022   Procedure: SUBMUCOSAL LIFTING INJECTION;  Surgeon: Jeani Hawking, MD;  Location: [facility];  Service: Gastroenterology;;    REVIEW OF SYSTEMS:  Constitutional: positive for fatigue Eyes: negative Ears, nose, mouth, throat, and face: negative Respiratory: negative Cardiovascular: negative Gastrointestinal: negative Genitourinary:negative Integument/breast: negative Hematologic/lymphatic:  negative Musculoskeletal:negative Neurological: negative Behavioral/Psych: negative Endocrine: negative Allergic/Immunologic: negative   PHYSICAL EXAMINATION: General appearance: alert, cooperative, fatigued, and no distress Head: Normocephalic, without obvious abnormality, atraumatic Neck: no adenopathy, no JVD, supple, symmetrical, trachea midline, and thyroid not enlarged, symmetric, no tenderness/mass/nodules Lymph nodes: Cervical, supraclavicular, and axillary nodes normal. Resp: clear to auscultation bilaterally Back: symmetric, no curvature. ROM normal. No CVA tenderness. Cardio: regular rate and rhythm, S1, S2 normal, no murmur, click, rub or gallop GI: soft, non-tender; bowel sounds normal; no masses,  no organomegaly Extremities: extremities normal, atraumatic, no cyanosis or edema Neurologic: Alert and oriented X 3, normal strength and tone. Normal symmetric reflexes. Normal coordination and gait  ECOG PERFORMANCE STATUS: 1 - Symptomatic but completely ambulatory  Blood pressure (!) 144/61, pulse 89, temperature 97.8 F (36.6 C), temperature source Tympanic, resp. rate 18, weight 169 lb 14.4 oz (77.1 kg), SpO2 100%.  LABORATORY DATA: Lab Results  Component Value Date   WBC 4.8 02/23/2023   HGB 10.7 (L) 02/23/2023   HCT 32.5 (L) 02/23/2023   MCV 89.3 02/23/2023  PLT 475 (H) 02/23/2023      Chemistry      Component Value Date/Time   NA 138 02/23/2023 1150   NA 134 09/11/2022 1259   K 4.2 02/23/2023 1150   CL 105 02/23/2023 1150   CO2 27 02/23/2023 1150   BUN 14 02/23/2023 1150   BUN 12 09/11/2022 1259   CREATININE 0.88 02/23/2023 1150   CREATININE 0.98 10/30/2013 1118      Component Value Date/Time   CALCIUM 9.2 02/23/2023 1150   ALKPHOS 85 02/23/2023 1150   AST 12 (L) 02/23/2023 1150   ALT 7 02/23/2023 1150   BILITOT 0.4 02/23/2023 1150       RADIOGRAPHIC STUDIES: No results found.  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  She is currently undergoing systemic chemotherapy with carboplatin for AUC of 5 on day 1 and etoposide 100 Mg/M2 on days 1, 2 and 3 status post 2 cycles.  This is concurrent with radiotherapy treated as limited stage disease because of the limited volume of her disease in the left lung.  Immunotherapy with Imfinzi will be added after completion of the radiotherapy. She has been tolerating this treatment fairly well except for the alopecia.     Extensive Stage Small Cell Lung Cancer Diagnosed in November with extensive stage small cell lung cancer involving the left lung, mediastinal lymph nodes, and metastases including a solitary brain metastasis. Currently on cycle 3 of chemotherapy with carboplatin and etoposide, and concurrent radiation therapy. Reports alopecia as the only side effect. No nausea, vomiting, or diarrhea. Weight loss noted due to previous diverticulitis, but weight is being regained. Recent imaging suggests tumor shrinkage; full scan to be performed after cycle 4. Immune therapy delayed due to potential lung inflammation from concurrent radiation, to be considered post-radiation. Discussed the importance of immune therapy for improving survival and quality of life, but outcomes are uncertain. - Administer cycle 3 of chemotherapy today - Schedule cycle 4 of chemotherapy in three weeks - Perform full scan after cycle 4 - Discuss initiation of immune therapy post-radiation  Brain Metastasis Solitary brain metastasis treated with stereotactic radiosurgery [facility]) by Dr. Kathrynn Running. Recent MRI indicates tumor shrinkage or resolution. Follow-up MRIs will be conducted every few months to monitor status. - Schedule MRI every few months to  monitor brain metastasis  General Health Maintenance Primary care physician, Dr. Jack Quarto, advised maintaining a weight of 170 lbs. - Maintain weight around 170 lbs as advised by primary care physician  Follow-up - Follow-up in three weeks for cycle 4 of chemotherapy - Schedule MRI scans every few months to monitor brain metastasis.    The patient voices understanding of current disease status and treatment options and is in agreement with the current care plan.  All questions were answered. The patient knows to call the clinic with any problems, questions or concerns. We can certainly see the patient much sooner if necessary.  The total time spent in the appointment was 30 minutes.  Disclaimer: This note was dictated with voice recognition software. Similar sounding words can inadvertently be transcribed and may not be corrected upon review.

## 2023-03-24 ENCOUNTER — Inpatient Hospital Stay: Payer: Medicare HMO

## 2023-03-24 ENCOUNTER — Ambulatory Visit: Payer: Medicare HMO

## 2023-03-24 ENCOUNTER — Other Ambulatory Visit: Payer: Self-pay | Admitting: Emergency Medicine

## 2023-03-24 ENCOUNTER — Other Ambulatory Visit: Payer: Self-pay

## 2023-03-24 ENCOUNTER — Ambulatory Visit
Admission: RE | Admit: 2023-03-24 | Discharge: 2023-03-24 | Disposition: A | Discharge status: Home / Self Care | Payer: Medicare HMO | Source: Ambulatory Visit | Attending: Radiation Oncology | Admitting: Radiation Oncology

## 2023-03-24 VITALS — BP 148/56 | HR 83 | Resp 20

## 2023-03-24 DIAGNOSIS — C7931 Secondary malignant neoplasm of brain: Secondary | ICD-10-CM | POA: Diagnosis not present

## 2023-03-24 DIAGNOSIS — C3412 Malignant neoplasm of upper lobe, left bronchus or lung: Secondary | ICD-10-CM | POA: Diagnosis not present

## 2023-03-24 DIAGNOSIS — Z5111 Encounter for antineoplastic chemotherapy: Secondary | ICD-10-CM | POA: Diagnosis not present

## 2023-03-24 DIAGNOSIS — Z510 Encounter for antineoplastic radiation therapy: Secondary | ICD-10-CM | POA: Diagnosis not present

## 2023-03-24 DIAGNOSIS — F418 Other specified anxiety disorders: Secondary | ICD-10-CM

## 2023-03-24 DIAGNOSIS — F17210 Nicotine dependence, cigarettes, uncomplicated: Secondary | ICD-10-CM | POA: Diagnosis not present

## 2023-03-24 DIAGNOSIS — Z79899 Other long term (current) drug therapy: Secondary | ICD-10-CM | POA: Diagnosis not present

## 2023-03-24 LAB — RAD ONC ARIA SESSION SUMMARY
Course Elapsed Days: 65
Plan Fractions Treated to Date: 26
Plan Prescribed Dose Per Fraction: 2 Gy
Plan Total Fractions Prescribed: 33
Plan Total Prescribed Dose: 66 Gy
Reference Point Dosage Given to Date: 52 Gy
Reference Point Session Dosage Given: 2 Gy
Session Number: 26

## 2023-03-24 MED ORDER — SODIUM CHLORIDE 0.9 % IV SOLN: INTRAVENOUS | Status: DC

## 2023-03-24 MED ORDER — DEXAMETHASONE SODIUM PHOSPHATE 10 MG/ML IJ SOLN
10.0000 mg | Freq: Once | INTRAMUSCULAR | Status: AC
  Administered 2023-03-24: 10 mg via INTRAVENOUS
  Filled 2023-03-24: qty 1

## 2023-03-24 MED ORDER — SODIUM CHLORIDE 0.9 % IV SOLN
100.0000 mg/m2 | Freq: Once | INTRAVENOUS | Status: AC
  Administered 2023-03-24: 200 mg via INTRAVENOUS
  Filled 2023-03-24: qty 10

## 2023-03-24 NOTE — Patient Instructions (Signed)
 [facility]  Discharge Instructions: Thank you for choosing [facility] to provide your oncology and hematology care.   If you have a lab appointment with the Cancer Center, please go directly to the Cancer Center and check in at the registration area.   Wear comfortable clothing and clothing appropriate for easy access to any Portacath or PICC line.   We strive to give you quality time with your provider. You may need to reschedule your appointment if you arrive late (15 or more minutes).  Arriving late affects you and other patients whose appointments are after yours.  Also, if you miss three or more appointments without notifying the office, you may be dismissed from the clinic at the provider's discretion.      For prescription refill requests, have your pharmacy contact our office and allow 72 hours for refills to be completed.    Today you received the following chemotherapy and/or immunotherapy agents: Etoposide      To help prevent nausea and vomiting after your treatment, we encourage you to take your nausea medication as directed.  BELOW ARE SYMPTOMS THAT SHOULD BE REPORTED IMMEDIATELY: *FEVER GREATER THAN 100.4 F (38 C) OR HIGHER *CHILLS OR SWEATING *NAUSEA AND VOMITING THAT IS NOT CONTROLLED WITH YOUR NAUSEA MEDICATION *UNUSUAL SHORTNESS OF BREATH *UNUSUAL BRUISING OR BLEEDING *URINARY PROBLEMS (pain or burning when urinating, or frequent urination) *BOWEL PROBLEMS (unusual diarrhea, constipation, pain near the anus) TENDERNESS IN MOUTH AND THROAT WITH OR WITHOUT PRESENCE OF ULCERS (sore throat, sores in mouth, or a toothache) UNUSUAL RASH, SWELLING OR PAIN  UNUSUAL VAGINAL DISCHARGE OR ITCHING   Items with * indicate a potential emergency and should be followed up as soon as possible or go to the Emergency Department if any problems should occur.  Please show the CHEMOTHERAPY ALERT CARD or IMMUNOTHERAPY  ALERT CARD at check-in to the Emergency Department and triage nurse.  Should you have questions after your visit or need to cancel or reschedule your appointment, please contact [facility]  Dept: [phone]  and follow the prompts.  Office hours are 8:00 a.m. to 4:30 p.m. Monday - Friday. Please note that voicemails left after 4:00 p.m. may not be returned until the following business day.  We are closed weekends and major holidays. You have access to a nurse at all times for urgent questions. Please call the main number to the clinic Dept: [phone] and follow the prompts.   For any non-urgent questions, you may also contact your provider using MyChart. We now offer e-Visits for anyone 10 and older to request care online for non-urgent symptoms. For details visit [email or web address].   Also download the MyChart app! Go to the app store, search "MyChart", open the app, select [place], and log in with your MyChart username and password.

## 2023-03-25 ENCOUNTER — Ambulatory Visit: Payer: Medicare HMO

## 2023-03-25 ENCOUNTER — Ambulatory Visit
Admission: RE | Admit: 2023-03-25 | Discharge: 2023-03-25 | Disposition: A | Discharge status: Home / Self Care | Payer: Medicare HMO | Source: Ambulatory Visit | Attending: Radiation Oncology | Admitting: Radiation Oncology

## 2023-03-25 ENCOUNTER — Other Ambulatory Visit: Payer: Self-pay

## 2023-03-25 ENCOUNTER — Inpatient Hospital Stay: Payer: Medicare HMO

## 2023-03-25 VITALS — BP 156/61 | HR 80 | Temp 97.8°F | Resp 20

## 2023-03-25 DIAGNOSIS — Z510 Encounter for antineoplastic radiation therapy: Secondary | ICD-10-CM | POA: Diagnosis not present

## 2023-03-25 DIAGNOSIS — C3412 Malignant neoplasm of upper lobe, left bronchus or lung: Secondary | ICD-10-CM | POA: Diagnosis not present

## 2023-03-25 DIAGNOSIS — C7931 Secondary malignant neoplasm of brain: Secondary | ICD-10-CM | POA: Diagnosis not present

## 2023-03-25 DIAGNOSIS — Z79899 Other long term (current) drug therapy: Secondary | ICD-10-CM | POA: Diagnosis not present

## 2023-03-25 DIAGNOSIS — Z5111 Encounter for antineoplastic chemotherapy: Secondary | ICD-10-CM | POA: Diagnosis not present

## 2023-03-25 DIAGNOSIS — F17210 Nicotine dependence, cigarettes, uncomplicated: Secondary | ICD-10-CM | POA: Diagnosis not present

## 2023-03-25 LAB — RAD ONC ARIA SESSION SUMMARY
Course Elapsed Days: 66
Plan Fractions Treated to Date: 27
Plan Prescribed Dose Per Fraction: 2 Gy
Plan Total Fractions Prescribed: 33
Plan Total Prescribed Dose: 66 Gy
Reference Point Dosage Given to Date: 54 Gy
Reference Point Session Dosage Given: 2 Gy
Session Number: 27

## 2023-03-25 MED ORDER — SODIUM CHLORIDE 0.9 % IV SOLN
100.0000 mg/m2 | Freq: Once | INTRAVENOUS | Status: AC
  Administered 2023-03-25: 200 mg via INTRAVENOUS
  Filled 2023-03-25: qty 10

## 2023-03-25 MED ORDER — SODIUM CHLORIDE 0.9 % IV SOLN: INTRAVENOUS | Status: DC

## 2023-03-25 MED ORDER — DEXAMETHASONE SODIUM PHOSPHATE 10 MG/ML IJ SOLN
10.0000 mg | Freq: Once | INTRAMUSCULAR | Status: AC
  Administered 2023-03-25: 10 mg via INTRAVENOUS
  Filled 2023-03-25: qty 1

## 2023-03-25 NOTE — Patient Instructions (Addendum)
 [facility]  Discharge Instructions: Thank you for choosing [facility] to provide your oncology and hematology care.   If you have a lab appointment with the Cancer Center, please go directly to the Cancer Center and check in at the registration area.   Wear comfortable clothing and clothing appropriate for easy access to any Portacath or PICC line.   We strive to give you quality time with your provider. You may need to reschedule your appointment if you arrive late (15 or more minutes).  Arriving late affects you and other patients whose appointments are after yours.  Also, if you miss three or more appointments without notifying the office, you may be dismissed from the clinic at the provider's discretion.      For prescription refill requests, have your pharmacy contact our office and allow 72 hours for refills to be completed.    Today you received the following chemotherapy and/or immunotherapy agents: Vepesid      To help prevent nausea and vomiting after your treatment, we encourage you to take your nausea medication as directed.  BELOW ARE SYMPTOMS THAT SHOULD BE REPORTED IMMEDIATELY: *FEVER GREATER THAN 100.4 F (38 C) OR HIGHER *CHILLS OR SWEATING *NAUSEA AND VOMITING THAT IS NOT CONTROLLED WITH YOUR NAUSEA MEDICATION *UNUSUAL SHORTNESS OF BREATH *UNUSUAL BRUISING OR BLEEDING *URINARY PROBLEMS (pain or burning when urinating, or frequent urination) *BOWEL PROBLEMS (unusual diarrhea, constipation, pain near the anus) TENDERNESS IN MOUTH AND THROAT WITH OR WITHOUT PRESENCE OF ULCERS (sore throat, sores in mouth, or a toothache) UNUSUAL RASH, SWELLING OR PAIN  UNUSUAL VAGINAL DISCHARGE OR ITCHING   Items with * indicate a potential emergency and should be followed up as soon as possible or go to the Emergency Department if any problems should occur.  Please show the CHEMOTHERAPY ALERT CARD or IMMUNOTHERAPY  ALERT CARD at check-in to the Emergency Department and triage nurse.  Should you have questions after your visit or need to cancel or reschedule your appointment, please contact [facility]  Dept: [phone]  and follow the prompts.  Office hours are 8:00 a.m. to 4:30 p.m. Monday - Friday. Please note that voicemails left after 4:00 p.m. may not be returned until the following business day.  We are closed weekends and major holidays. You have access to a nurse at all times for urgent questions. Please call the main number to the clinic Dept: [phone] and follow the prompts.   For any non-urgent questions, you may also contact your provider using MyChart. We now offer e-Visits for anyone 46 and older to request care online for non-urgent symptoms. For details visit [email or web address].   Also download the MyChart app! Go to the app store, search "MyChart", open the app, select [place], and log in with your MyChart username and password.

## 2023-03-26 ENCOUNTER — Other Ambulatory Visit: Payer: Self-pay

## 2023-03-26 ENCOUNTER — Ambulatory Visit
Admission: RE | Admit: 2023-03-26 | Discharge: 2023-03-26 | Disposition: A | Discharge status: Home / Self Care | Payer: Medicare HMO | Source: Ambulatory Visit | Attending: Radiation Oncology | Admitting: Radiation Oncology

## 2023-03-26 DIAGNOSIS — F17210 Nicotine dependence, cigarettes, uncomplicated: Secondary | ICD-10-CM | POA: Diagnosis not present

## 2023-03-26 DIAGNOSIS — Z510 Encounter for antineoplastic radiation therapy: Secondary | ICD-10-CM | POA: Diagnosis not present

## 2023-03-26 DIAGNOSIS — C3412 Malignant neoplasm of upper lobe, left bronchus or lung: Secondary | ICD-10-CM | POA: Diagnosis not present

## 2023-03-26 DIAGNOSIS — C7931 Secondary malignant neoplasm of brain: Secondary | ICD-10-CM | POA: Diagnosis not present

## 2023-03-26 DIAGNOSIS — Z5111 Encounter for antineoplastic chemotherapy: Secondary | ICD-10-CM | POA: Diagnosis not present

## 2023-03-26 DIAGNOSIS — Z79899 Other long term (current) drug therapy: Secondary | ICD-10-CM | POA: Diagnosis not present

## 2023-03-26 LAB — RAD ONC ARIA SESSION SUMMARY
Course Elapsed Days: 67
Plan Fractions Treated to Date: 28
Plan Prescribed Dose Per Fraction: 2 Gy
Plan Total Fractions Prescribed: 33
Plan Total Prescribed Dose: 66 Gy
Reference Point Dosage Given to Date: 56 Gy
Reference Point Session Dosage Given: 2 Gy
Session Number: 28

## 2023-03-29 ENCOUNTER — Ambulatory Visit: Payer: Medicare HMO

## 2023-03-29 ENCOUNTER — Other Ambulatory Visit: Payer: Self-pay

## 2023-03-29 ENCOUNTER — Inpatient Hospital Stay: Payer: Medicare HMO

## 2023-03-29 ENCOUNTER — Other Ambulatory Visit: Payer: Medicare HMO

## 2023-03-29 ENCOUNTER — Ambulatory Visit
Admission: RE | Admit: 2023-03-29 | Discharge: 2023-03-29 | Disposition: A | Discharge status: Home / Self Care | Payer: Medicare HMO | Source: Ambulatory Visit | Attending: Radiation Oncology | Admitting: Radiation Oncology

## 2023-03-29 ENCOUNTER — Ambulatory Visit: Payer: Medicare HMO | Admitting: Internal Medicine

## 2023-03-29 DIAGNOSIS — F17210 Nicotine dependence, cigarettes, uncomplicated: Secondary | ICD-10-CM | POA: Diagnosis not present

## 2023-03-29 DIAGNOSIS — Z5111 Encounter for antineoplastic chemotherapy: Secondary | ICD-10-CM | POA: Insufficient documentation

## 2023-03-29 DIAGNOSIS — C7931 Secondary malignant neoplasm of brain: Secondary | ICD-10-CM | POA: Diagnosis not present

## 2023-03-29 DIAGNOSIS — Z79899 Other long term (current) drug therapy: Secondary | ICD-10-CM | POA: Diagnosis not present

## 2023-03-29 DIAGNOSIS — C3412 Malignant neoplasm of upper lobe, left bronchus or lung: Secondary | ICD-10-CM | POA: Insufficient documentation

## 2023-03-29 DIAGNOSIS — Z510 Encounter for antineoplastic radiation therapy: Secondary | ICD-10-CM | POA: Diagnosis not present

## 2023-03-29 LAB — RAD ONC ARIA SESSION SUMMARY
Course Elapsed Days: 70
Plan Fractions Treated to Date: 29
Plan Prescribed Dose Per Fraction: 2 Gy
Plan Total Fractions Prescribed: 33
Plan Total Prescribed Dose: 66 Gy
Reference Point Dosage Given to Date: 58 Gy
Reference Point Session Dosage Given: 2 Gy
Session Number: 29

## 2023-03-29 NOTE — Progress Notes (Signed)
 Pt called me this morning to let me know that she had an episode of bright red blood present in her stool. Pt had not had a BM in "4-5" days which is "normal" for her. She had to manually dislodge some of the stool for it to pass and there was some blood present with it. Pt called EMS who told her it was most likely hemorrhoids from straining. They did not take her to the ED for assessment. Pt states she has not had any stool pass, nor any signed of bleeding anywhere else. Pt denies abdominal pain or fever. Pt states her neighbor has given her some miralax to use as needed. Pt asked if she should reach out to her PCM as she is afraid she may have diverticulitis. I encouraged her to do so ASAP.  Dr Arbutus Ped notified of incident.

## 2023-03-30 ENCOUNTER — Ambulatory Visit: Admitting: Family Medicine

## 2023-03-30 ENCOUNTER — Ambulatory Visit: Payer: Medicare HMO

## 2023-03-30 ENCOUNTER — Inpatient Hospital Stay: Attending: Hematology

## 2023-03-30 ENCOUNTER — Ambulatory Visit: Payer: Self-pay | Admitting: Emergency Medicine

## 2023-03-30 DIAGNOSIS — C3400 Malignant neoplasm of unspecified main bronchus: Secondary | ICD-10-CM | POA: Insufficient documentation

## 2023-03-30 DIAGNOSIS — C3412 Malignant neoplasm of upper lobe, left bronchus or lung: Secondary | ICD-10-CM | POA: Insufficient documentation

## 2023-03-30 DIAGNOSIS — Z79899 Other long term (current) drug therapy: Secondary | ICD-10-CM | POA: Insufficient documentation

## 2023-03-30 DIAGNOSIS — F17210 Nicotine dependence, cigarettes, uncomplicated: Secondary | ICD-10-CM | POA: Insufficient documentation

## 2023-03-30 DIAGNOSIS — Z5111 Encounter for antineoplastic chemotherapy: Secondary | ICD-10-CM | POA: Insufficient documentation

## 2023-03-30 DIAGNOSIS — C7931 Secondary malignant neoplasm of brain: Secondary | ICD-10-CM | POA: Insufficient documentation

## 2023-03-30 NOTE — Telephone Encounter (Signed)
 Chief Complaint: GI symptoms Symptoms: Dizzy, nausea, diarrhea, chills Frequency: Intermittent Pertinent Negatives: Patient denies fever, abdomen pain Disposition: [] ED /[] Urgent Care (no appt availability in office) / [x] Appointment(In office/virtual)/ []  [place] [facility]/ [] Home Care/ [] Refused Recommended Disposition /[] [place] [facility]/ []  Follow-up with PCP Additional Notes: Pt states she started having diarrhea and vomiting last night. Pt states today she just has nausea and diarrhea. Pt states the last time she had these symptoms Dr. Alvy Bimler prescribed her an antibiotic. Pt states she did have blood in her stool over the weekend and EMS was called. Pt states she was constipated and after she had a large bm she has not seen any blood in her stool. Pt denies a fever but does have chills. Pt states she is drinking a lot of fluids and denies any signs of dehydration. Pt scheduled for a virtual appt today. This RN educated pt on home care, new-worsening symptoms, when to call back/seek emergent care. Pt verbalized understanding and agrees to plan.    Pt states she had bad diarrhea last night. Pt states she was throwing up last night. Pt states today she just has nausea. Pt states she had diarrhea right before this phone call. Pt states last time this happened Dr. Alvy Bimler prescribed her an antibiotic. Pt states    Copied from CRM [id number]. Topic: Clinical - Red Word Triage >> Mar 30, 2023 10:41 AM Jasmine Long wrote: Red Word that prompted transfer to Nurse Triage: patient is throwing up dizzy and has diarrhea she is not feeling well Reason for Disposition  [1] MODERATE diarrhea (e.g., 4-6 times / day more than normal) AND [2] present > 48 hours (2 days)  Answer Assessment - Initial Assessment Questions 1. DIARRHEA SEVERITY: "How bad is the diarrhea?" "How many more stools have you had in the past 24 hours than normal?"    - NO DIARRHEA (SCALE 0)   - MILD (SCALE 1-3): Few loose or  mushy BMs; increase of 1-3 stools over normal daily number of stools; mild increase in ostomy output.   -  MODERATE (SCALE 4-7): Increase of 4-6 stools daily over normal; moderate increase in ostomy output.   -  SEVERE (SCALE 8-10; OR "WORST POSSIBLE"): Increase of 7 or more stools daily over normal; moderate increase in ostomy output; incontinence.     4x last night, x2 today 2. ONSET: "When did the diarrhea begin?"      Last night 5-6 PM 3. VOMITING: "Are you also vomiting?" If Yes, ask: "How many times in the past 24 hours?"      Last night 5-6 PM, nothing came up just saliva 4. ABDOMEN PAIN: "Are you having any abdomen pain?" If Yes, ask: "What does it feel like?" (e.g., crampy, dull, intermittent, constant)      Denies 5. ORAL INTAKE: If vomiting, "Have you been able to drink liquids?" "How much liquids have you had in the past 24 hours?"     Yes, drinking Ginger Ale 6. HYDRATION: "Any signs of dehydration?" (e.g., dry mouth [not just dry lips], too weak to stand, dizziness, new weight loss) "When did you last urinate?"     Denies dehydration signs 7. ANTIBIOTIC USE: "Are you taking antibiotics now or have you taken antibiotics in the past 2 months?"       Doxycycline about 1-1.5 week ago for sinus infection 11. OTHER SYMPTOMS: "Do you have any other symptoms?" (e.g., fever, blood in stool)       Yes on Saturday,  was constipated; EMS came, she then was able to have a bm; no blood since then  Protocols used: [facility]

## 2023-03-31 ENCOUNTER — Other Ambulatory Visit: Payer: Self-pay

## 2023-03-31 ENCOUNTER — Ambulatory Visit
Admission: RE | Admit: 2023-03-31 | Discharge: 2023-03-31 | Disposition: A | Discharge status: Home / Self Care | Payer: Medicare HMO | Source: Ambulatory Visit | Attending: Radiation Oncology

## 2023-03-31 ENCOUNTER — Ambulatory Visit: Payer: Medicare HMO

## 2023-03-31 DIAGNOSIS — Z5111 Encounter for antineoplastic chemotherapy: Secondary | ICD-10-CM | POA: Diagnosis not present

## 2023-03-31 DIAGNOSIS — Z79899 Other long term (current) drug therapy: Secondary | ICD-10-CM | POA: Diagnosis not present

## 2023-03-31 DIAGNOSIS — C7931 Secondary malignant neoplasm of brain: Secondary | ICD-10-CM | POA: Diagnosis not present

## 2023-03-31 DIAGNOSIS — C3412 Malignant neoplasm of upper lobe, left bronchus or lung: Secondary | ICD-10-CM | POA: Diagnosis not present

## 2023-03-31 DIAGNOSIS — Z510 Encounter for antineoplastic radiation therapy: Secondary | ICD-10-CM | POA: Diagnosis not present

## 2023-03-31 DIAGNOSIS — F17210 Nicotine dependence, cigarettes, uncomplicated: Secondary | ICD-10-CM | POA: Diagnosis not present

## 2023-03-31 LAB — RAD ONC ARIA SESSION SUMMARY
Course Elapsed Days: 72
Plan Fractions Treated to Date: 30
Plan Prescribed Dose Per Fraction: 2 Gy
Plan Total Fractions Prescribed: 33
Plan Total Prescribed Dose: 66 Gy
Reference Point Dosage Given to Date: 60 Gy
Reference Point Session Dosage Given: 2 Gy
Session Number: 30

## 2023-04-01 ENCOUNTER — Ambulatory Visit: Payer: Medicare HMO

## 2023-04-02 ENCOUNTER — Ambulatory Visit

## 2023-04-02 ENCOUNTER — Ambulatory Visit: Payer: Medicare HMO

## 2023-04-02 ENCOUNTER — Ambulatory Visit: Admission: RE | Admit: 2023-04-02 | Payer: Medicare HMO | Source: Ambulatory Visit

## 2023-04-03 ENCOUNTER — Other Ambulatory Visit (HOSPITAL_COMMUNITY): Payer: Medicare HMO

## 2023-04-05 ENCOUNTER — Ambulatory Visit
Admission: RE | Admit: 2023-04-05 | Discharge: 2023-04-05 | Disposition: A | Discharge status: Home / Self Care | Source: Ambulatory Visit | Attending: Radiation Oncology | Admitting: Radiation Oncology

## 2023-04-05 ENCOUNTER — Ambulatory Visit: Payer: Medicare HMO

## 2023-04-05 ENCOUNTER — Ambulatory Visit

## 2023-04-05 ENCOUNTER — Other Ambulatory Visit: Payer: Self-pay

## 2023-04-05 DIAGNOSIS — F17210 Nicotine dependence, cigarettes, uncomplicated: Secondary | ICD-10-CM | POA: Diagnosis not present

## 2023-04-05 DIAGNOSIS — C3412 Malignant neoplasm of upper lobe, left bronchus or lung: Secondary | ICD-10-CM | POA: Diagnosis not present

## 2023-04-05 DIAGNOSIS — Z79899 Other long term (current) drug therapy: Secondary | ICD-10-CM | POA: Diagnosis not present

## 2023-04-05 DIAGNOSIS — C7931 Secondary malignant neoplasm of brain: Secondary | ICD-10-CM | POA: Diagnosis not present

## 2023-04-05 DIAGNOSIS — Z510 Encounter for antineoplastic radiation therapy: Secondary | ICD-10-CM | POA: Diagnosis not present

## 2023-04-05 DIAGNOSIS — Z5111 Encounter for antineoplastic chemotherapy: Secondary | ICD-10-CM | POA: Diagnosis not present

## 2023-04-05 LAB — RAD ONC ARIA SESSION SUMMARY
Course Elapsed Days: 77
Plan Fractions Treated to Date: 31
Plan Prescribed Dose Per Fraction: 2 Gy
Plan Total Fractions Prescribed: 33
Plan Total Prescribed Dose: 66 Gy
Reference Point Dosage Given to Date: 62 Gy
Reference Point Session Dosage Given: 2 Gy
Session Number: 31

## 2023-04-06 ENCOUNTER — Other Ambulatory Visit: Payer: Self-pay

## 2023-04-06 ENCOUNTER — Ambulatory Visit: Payer: Medicare HMO

## 2023-04-06 ENCOUNTER — Ambulatory Visit: Payer: Medicare HMO | Admitting: Internal Medicine

## 2023-04-06 ENCOUNTER — Ambulatory Visit

## 2023-04-06 ENCOUNTER — Other Ambulatory Visit: Payer: Medicare HMO

## 2023-04-06 ENCOUNTER — Ambulatory Visit
Admission: RE | Admit: 2023-04-06 | Discharge: 2023-04-06 | Disposition: A | Discharge status: Home / Self Care | Source: Ambulatory Visit | Attending: Radiation Oncology | Admitting: Radiation Oncology

## 2023-04-06 DIAGNOSIS — C3412 Malignant neoplasm of upper lobe, left bronchus or lung: Secondary | ICD-10-CM | POA: Diagnosis not present

## 2023-04-06 DIAGNOSIS — Z510 Encounter for antineoplastic radiation therapy: Secondary | ICD-10-CM | POA: Diagnosis not present

## 2023-04-06 DIAGNOSIS — C7931 Secondary malignant neoplasm of brain: Secondary | ICD-10-CM | POA: Diagnosis not present

## 2023-04-06 DIAGNOSIS — F17210 Nicotine dependence, cigarettes, uncomplicated: Secondary | ICD-10-CM | POA: Diagnosis not present

## 2023-04-06 DIAGNOSIS — Z79899 Other long term (current) drug therapy: Secondary | ICD-10-CM | POA: Diagnosis not present

## 2023-04-06 DIAGNOSIS — Z5111 Encounter for antineoplastic chemotherapy: Secondary | ICD-10-CM | POA: Diagnosis not present

## 2023-04-06 LAB — RAD ONC ARIA SESSION SUMMARY
Course Elapsed Days: 78
Plan Fractions Treated to Date: 32
Plan Prescribed Dose Per Fraction: 2 Gy
Plan Total Fractions Prescribed: 33
Plan Total Prescribed Dose: 66 Gy
Reference Point Dosage Given to Date: 64 Gy
Reference Point Session Dosage Given: 2 Gy
Session Number: 32

## 2023-04-07 ENCOUNTER — Ambulatory Visit: Payer: Medicare HMO

## 2023-04-07 ENCOUNTER — Other Ambulatory Visit: Payer: Self-pay

## 2023-04-07 ENCOUNTER — Ambulatory Visit
Admission: RE | Admit: 2023-04-07 | Discharge: 2023-04-07 | Disposition: A | Discharge status: Home / Self Care | Source: Ambulatory Visit | Attending: Radiation Oncology | Admitting: Radiation Oncology

## 2023-04-07 ENCOUNTER — Encounter: Payer: Medicare HMO | Admitting: Internal Medicine

## 2023-04-07 DIAGNOSIS — Z79899 Other long term (current) drug therapy: Secondary | ICD-10-CM | POA: Diagnosis not present

## 2023-04-07 DIAGNOSIS — F17210 Nicotine dependence, cigarettes, uncomplicated: Secondary | ICD-10-CM | POA: Diagnosis not present

## 2023-04-07 DIAGNOSIS — Z510 Encounter for antineoplastic radiation therapy: Secondary | ICD-10-CM | POA: Diagnosis not present

## 2023-04-07 DIAGNOSIS — C7931 Secondary malignant neoplasm of brain: Secondary | ICD-10-CM | POA: Diagnosis not present

## 2023-04-07 DIAGNOSIS — C3412 Malignant neoplasm of upper lobe, left bronchus or lung: Secondary | ICD-10-CM | POA: Diagnosis not present

## 2023-04-07 DIAGNOSIS — Z5111 Encounter for antineoplastic chemotherapy: Secondary | ICD-10-CM | POA: Diagnosis not present

## 2023-04-07 DIAGNOSIS — C3430 Malignant neoplasm of lower lobe, unspecified bronchus or lung: Secondary | ICD-10-CM

## 2023-04-07 LAB — RAD ONC ARIA SESSION SUMMARY
Course Elapsed Days: 79
Plan Fractions Treated to Date: 33
Plan Prescribed Dose Per Fraction: 2 Gy
Plan Total Fractions Prescribed: 33
Plan Total Prescribed Dose: 66 Gy
Reference Point Dosage Given to Date: 66 Gy
Reference Point Session Dosage Given: 2 Gy
Session Number: 33

## 2023-04-08 ENCOUNTER — Ambulatory Visit: Payer: Medicare HMO

## 2023-04-08 NOTE — Radiation Completion Notes (Addendum)
  Radiation Oncology         [phone] ________________________________  Name: Jasmine Long MRN: [id number]  Date: 04/07/2023  DOB: Jan 20, 1951  Referring Physician: Edwina Barth, M.D. Date of Service: 2023-04-08 Radiation Oncologist: Margaretmary Bayley, M.D. [facility]     RADIATION ONCOLOGY END OF TREATMENT NOTE     Diagnosis: 73 yo woman with Small Cell Left Upper Lung cancer with a solitary 4 mm right parietal brain metastasis.   Intent: Palliative     ==========DELIVERED PLANS==========  First Treatment Date: 2023-01-18 Last Treatment Date: 2023-04-07   Plan Name: Lung_L Site: Bronchus, Left Technique: 3D Mode: Photon Dose Per Fraction: 2 Gy Prescribed Dose (Delivered / Prescribed): 66 Gy / 66 Gy Prescribed Fxs (Delivered / Prescribed): 33 / 33   Plan Name: Brain_SRS Site: Brain Technique: SBRT/SRT-3D Mode: Photon Dose Per Fraction: 20 Gy Prescribed Dose (Delivered / Prescribed): 20 Gy / 20 Gy Prescribed Fxs (Delivered / Prescribed): 1 / 1     ==========ON TREATMENT VISIT DATES========== 2023-01-29, 2023-02-04, 2023-02-26, 2023-03-04, 2023-03-08, 2023-03-12, 2023-03-26, 2023-04-05   See weekly On Treatment Notes in Epic for details in the Media tab (listed as Progress notes on the On Treatment Visit Dates listed above).  She tolerated the daily radiation treatments and SRS brain treatment well with only modest fatigue.  The patient will receive a call in about one month from the radiation oncology department. She will continue follow up with her medical oncologist, Dr. Shirline Frees, as well.  ------------------------------------------------   Margaretmary Dys, MD [facility]  Radiation Oncology Direct Dial: [phone]  Fax: [phone] [email or web address]  Skype  LinkedIn

## 2023-04-09 ENCOUNTER — Institutional Professional Consult (permissible substitution): Payer: Medicare HMO | Admitting: Emergency Medicine

## 2023-04-12 NOTE — Progress Notes (Signed)
 Pt called to confirm the dates and times of her upcoming treatment. Pt gets confused because she feels like she gets reminder calls and messages from MyChart telling her different times. I gave her the times I see in her EMR for her treatments this W/Th/F. Pt confirmed understanding of appt dates and times given.

## 2023-04-13 MED FILL — Fosaprepitant Dimeglumine For IV Infusion 150 MG (Base Eq): INTRAVENOUS | Qty: 5 | Status: AC

## 2023-04-14 ENCOUNTER — Inpatient Hospital Stay: Payer: Medicare HMO | Admitting: Internal Medicine

## 2023-04-14 ENCOUNTER — Inpatient Hospital Stay: Payer: Medicare HMO

## 2023-04-14 NOTE — Progress Notes (Signed)
 Pt called me at 6:34 this morning and left me a VM stating she wasn't able to come in today because she had been "throwing up all night"  I called the pt at 8:56 to get more detail. Pt states that she was "really cold all day yesterday (3/18)", claims her highest temperature was "99.2 or 3". Last night she "started sneezing a lot" Pt started vomiting last night and that "It's just mucus from the drainage" pt states as soon as she lies down she starts coughing. Pt states her neighbor is going to get her some Mucinex. I asked if pt had considered testing for Covid, pt declined, stating "I know it's not Covid, it's just a really bad cold". I told the pt if her symptoms worsen, if she starts having increased SOB above her baseline, or develops a fever, she needs to go to the ER. I let her know that I did speak with Dr Arbutus Ped about her symptoms and he let me know that we will have to cancel her the rest of the week because it is a 3 day regimen.  Pt seemed surprised and thought if she felt better she could just come in on Friday. I explained that the regimen is 3 days and she can't just receive just 1 of the 3 days of treatment. Pt verbalized understanding.  I also let the pt know that because we have to cancel her appts this week, we could not reschedule her last cycle again until 3/31 due to infusion room availability. Pt states she still wants to get her second opinion at [facility]. I checked the pts chart and let her know there is an appt for her at [facility] on 3/24 at 10:00am. Pt states she will call me later to get the times for her reschedule infusion appts.

## 2023-04-15 ENCOUNTER — Inpatient Hospital Stay: Payer: Medicare HMO

## 2023-04-15 NOTE — Progress Notes (Signed)
 Pt called just after 4pm today because she is concerned about swelling in her LLE from her knee to her foot, mostly in her calf. She states it started last night. Pt states the area is tender to the touch, but does not pit. I told her I would like Dr Arbutus Ped know and get back to her.  After discussing the pt's symptoms with Dr Arbutus Ped, the recommendation was for her to make a follow up appt with him or with her [facility] for evaluation. If the pt is very concerned, she can report to an urgent care or ED. Pt states she would like to see Dr Arbutus Ped. I offered the pt the 8:15am on Monday 3/24. Pt accepted offer. Message sent to scheduler. I encouraged the pt to call with any health concerns to reach out to Dr [facility] nurse in the future.  Pt verbalized understanding.

## 2023-04-16 ENCOUNTER — Ambulatory Visit: Payer: Medicare HMO

## 2023-04-16 ENCOUNTER — Ambulatory Visit: Payer: Self-pay

## 2023-04-16 NOTE — Telephone Encounter (Signed)
 Copied from CRM [id number]. Topic: Clinical - Red Word Triage >> Apr 16, 2023  8:46 AM Jasmine Long H wrote: Jasmine Long that prompted transfer to Nurse Triage: Left leg from knee down is swelling and foot swelling as well little pain last night, just finished radiation.   Chief Complaint: Left lower leg swelling, Sinus issues, and Urinary Tract issues Symptoms: left lower leg swelling, nasal drainage,  Frequency: a couple of days Pertinent Negatives: Patient denies chest pain, difficulty breathing, fever, nausea, vomiting, diarrhea Disposition: [] ED /[] Urgent Care (no appt availability in office) / [] Appointment(In office/virtual)/ []  [facility]/ [] Home Care/ [x] Refused Recommended Disposition /[] [place] [facility]/ []  Follow-up with PCP Additional Notes: Patient called and advised that her left lower leg from the knee down has been swollen for the past few days. She said that it only looks like it has slightly gone down this morning.  She denies pain at this time but states that it was hurting some last night.  She states that she finished radiation last Thursday and has three more times to take chemotherapy. Patient also thinks she has a sinus infection and a possible urinary tract infection. She denies chest pain, difficulty breathing, nausea, vomiting, or diarrhea. Patient states that she only sees her PCP Dr Edwina Barth.  She states that she does not want to go to an urgent care at this time either. Patient doesn't want to go to an urgent care or see another doctor for her urinary symptoms and sinus issues because Dr Edwina Barth her PCP is familiar with her and she is comfortable with him and he gives her certain antibiotics that work for her. She is advised that if anything gets worse to go to the Emergency Room.  Patient verbalized understanding of this. She is encouraged multiple times that the recommendation at this time based on her having swelling only on one side (her  left lower leg from the knee down) is for her to be seen in the next 4 hours by a provider.  Her PCP doesn't have an opening today in the next 4 hours and neither does another provider at her PCP office.  Patient states that she sees her oncologist, Dr Arbutus Ped, Monday and will let him look at her left lower leg and then she will call us to get an appointment for her sinus issues and urinary issues.  She is advised that she can also go to an urgent care for her urinary complaints and her sinus complaints but she didn't want to do that.  Patient is advised again to call us back if something changes and go to the Emergency Room if anything worsens.  She verbalized understanding of this.    Reason for Disposition  [1] Thigh, calf, or ankle swelling AND [2] only 1 side  Answer Assessment - Initial Assessment Questions 1. ONSET: "When did the swelling start?" (e.g., minutes, hours, days)     A couple of days ago 2. LOCATION: "What part of the leg is swollen?"  "Are both legs swollen or just one leg?"     Left lower leg from the knee down 3. SEVERITY: "How bad is the swelling?" (e.g., localized; mild, moderate, severe)   - Localized: Small area of swelling localized to one leg.   - MILD pedal edema: Swelling limited to foot and ankle, pitting edema < 1/4 inch (6 mm) deep, rest and elevation eliminate most or all swelling.   - MODERATE edema: Swelling of lower leg to knee,  pitting edema > 1/4 inch (6 mm) deep, rest and elevation only partially reduce swelling.   - SEVERE edema: Swelling extends above knee, facial or hand swelling present.      Patient states swelling went down slightly this morning 4. REDNESS: "Does the swelling look red or infected?"     No 5. PAIN: "Is the swelling painful to touch?" If Yes, ask: "How painful is it?"   (Scale 1-10; mild, moderate or severe)     A little bit last night and no pain at this time 6. FEVER: "Do you have a fever?" If Yes, ask: "What is it, how was it  measured, and when did it start?"      No 7. CAUSE: "What do you think is causing the leg swelling?"     unsure 8. MEDICAL HISTORY: "Do you have a history of blood clots (e.g., DVT), cancer, heart failure, kidney disease, or liver failure?"     Hx of lung cancer 9. RECURRENT SYMPTOM: "Have you had leg swelling before?" If Yes, ask: "When was the last time?" "What happened that time?"     No 10. OTHER SYMPTOMS: "Do you have any other symptoms?" (e.g., chest pain, difficulty breathing)       Sinus symptoms--nasal drainage and coughing up clear phlegm x 4-5 days. Urinary symptoms--burning with urination x 1 day  Protocols used: Leg Swelling and Edema-A-AH

## 2023-04-19 ENCOUNTER — Ambulatory Visit (INDEPENDENT_AMBULATORY_CARE_PROVIDER_SITE_OTHER)
Admission: RE | Admit: 2023-04-19 | Discharge: 2023-04-19 | Disposition: A | Discharge status: Home / Self Care | Source: Ambulatory Visit | Attending: Internal Medicine | Admitting: Internal Medicine

## 2023-04-19 ENCOUNTER — Inpatient Hospital Stay: Admitting: Internal Medicine

## 2023-04-19 VITALS — BP 155/63 | HR 93 | Temp 97.8°F | Resp 17 | Wt 170.7 lb

## 2023-04-19 DIAGNOSIS — C7931 Secondary malignant neoplasm of brain: Secondary | ICD-10-CM | POA: Diagnosis not present

## 2023-04-19 DIAGNOSIS — C3400 Malignant neoplasm of unspecified main bronchus: Secondary | ICD-10-CM

## 2023-04-19 DIAGNOSIS — Z79899 Other long term (current) drug therapy: Secondary | ICD-10-CM | POA: Diagnosis not present

## 2023-04-19 DIAGNOSIS — M7989 Other specified soft tissue disorders: Secondary | ICD-10-CM

## 2023-04-19 DIAGNOSIS — F17210 Nicotine dependence, cigarettes, uncomplicated: Secondary | ICD-10-CM | POA: Diagnosis not present

## 2023-04-19 DIAGNOSIS — Z5111 Encounter for antineoplastic chemotherapy: Secondary | ICD-10-CM | POA: Diagnosis not present

## 2023-04-19 DIAGNOSIS — C3412 Malignant neoplasm of upper lobe, left bronchus or lung: Secondary | ICD-10-CM | POA: Diagnosis not present

## 2023-04-19 NOTE — Progress Notes (Addendum)
  Radiation Oncology         [phone] ________________________________  Name: Jasmine Long MRN: [id number]  Date of Service: 04/19/2023  DOB: Dec 12, 1950  Post Treatment Telephone Note  Diagnosis:  C34.12 Malignant neoplasm of upper lobe, left bronchus or lung; C79.31 Secondary malignant neoplasm of brain (as documented in provider EOT note)  The patient was not available for call today. Voicemail left.  The patient has scheduled follow up with her medical oncologist Dr. Mosetta Putt for ongoing care, and was encouraged to call if she develops concerns or questions regarding radiation.    Ruel Favors, LPN

## 2023-04-19 NOTE — Progress Notes (Signed)
 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    PRIOR THERAPY: SRS to solitary brain metastasis under the care of Dr. Kathrynn Running   CURRENT THERAPY:  1) of systemic chemotherapy with carboplatin for an AUC 5 and etoposide 100 mg/m on days 1, 2, and 3 IV every 3 weeks.  Imfinzi will be added once she completes palliative radiation.  Status post 3 cycles.  This is concurrent with radiotherapy completed on April 05, 2023.   INTERVAL HISTORY: Jasmine Long 73 y.o. female returns to the clinic today for follow-up visit.Discussed the use of AI scribe software for clinical note transcription with the patient, who gave verbal consent to proceed.  History of Present Illness   Jasmine Long is a 73 year old female with extensive stage small cell lung cancer who presents for re-evaluation.  She was diagnosed with extensive stage small cell lung cancer in December 2024 and has been undergoing treatment with systemic chemotherapy, specifically carboplatin and etoposide, concurrent with radiation therapy. She completed three cycles of chemotherapy, but her treatment schedule was disrupted due to illness. She is scheduled to resume treatment on April 26, 2023.  She experienced a sinus infection and vomiting mucus, which led to a delay in her chemotherapy sessions. She also reports symptoms of a urinary tract infection and plans to consult her family  doctor for these issues.  She mentions swelling in her left foot, which she describes as less severe than before, but still noticeable. She experiences pain in her calf when pressure is applied and has concerns about a possible blood clot, although she is unsure.  She notes a history of a compressed fracture from a fall, which she associates with her current back issues. She reports back pain and numbness, possibly related to the previous compression fracture and degenerative changes.  She expresses concern about hair loss due to chemotherapy, noting that her hair is growing back but is upset about the texture changes. She humorously mentions being told that her hair would grow back curly, which she disputes as she naturally has curly hair.       MEDICAL HISTORY: Past Medical History:  Diagnosis Date   Allergy    Anxiety    Depression    Diabetes mellitus without complication (HCC)    Heart murmur    Hypertension    Thyroid disease    Ulcer     ALLERGIES:  has no known allergies.  MEDICATIONS:  Current Outpatient Medications  Medication Sig Dispense Refill   ALPRAZolam (XANAX) 0.5 MG tablet Take 1 tablet (0.5 mg total) by mouth 2 (two) times daily as needed for anxiety. 30 tablet 3   atorvastatin (LIPITOR) 20 MG tablet Take 1 tablet by mouth once daily 90 tablet 1   buPROPion (WELLBUTRIN XL) 300 MG 24 hr tablet Take 1 tablet by mouth once daily 90 tablet 1   busPIRone (BUSPAR) 7.5 MG tablet Take 1 tablet (7.5 mg total) by  mouth 2 (two) times daily. 60 tablet 1   escitalopram (LEXAPRO) 10 MG tablet Take 1 tablet (10 mg total) by mouth daily. 90 tablet 3   gabapentin (NEURONTIN) 300 MG capsule Take 300 mg by mouth as needed (numbness).     levothyroxine (SYNTHROID) 125 MCG tablet TAKE 1 TABLET BY MOUTH ONCE DAILY BEFORE BREAKFAST 90 tablet 0   lidocaine (LIDODERM) 5 % Place 1 patch onto the skin daily. Remove & Discard patch within 12 hours or as directed by MD 30 patch 0    lidocaine-prilocaine (EMLA) cream Apply 1 Application topically as needed. 30 g 2   metFORMIN (GLUCOPHAGE) 1000 MG tablet TAKE 1 TABLET BY MOUTH TWICE DAILY WITH A MEAL 180 tablet 1   metoprolol succinate (TOPROL-XL) 25 MG 24 hr tablet Take 1 tablet by mouth once daily 90 tablet 3   nicotine (NICODERM CQ) 21 mg/24hr patch Place 1 patch (21 mg total) onto the skin daily. 28 patch 3   ondansetron (ZOFRAN) 8 MG tablet Take 1 tablet (8 mg total) by mouth every 8 (eight) hours as needed for nausea or vomiting. 30 tablet 1   pantoprazole (PROTONIX) 40 MG tablet Take 1 tablet twice a day for 2 weeks and then daily after that 90 tablet 3   prochlorperazine (COMPAZINE) 10 MG tablet Take 1 tablet (10 mg total) by mouth every 6 (six) hours as needed for nausea or vomiting (Nausea or vomiting). 30 tablet 1   valsartan-hydrochlorothiazide (DIOVAN-HCT) 160-12.5 MG tablet Take 1 tablet by mouth once daily 90 tablet 3   No current facility-administered medications for this visit.    SURGICAL HISTORY:  Past Surgical History:  Procedure Laterality Date   BREAST SURGERY     CESAREAN SECTION     CHOLECYSTECTOMY     COLONOSCOPY WITH PROPOFOL N/A 10/30/2022   Procedure: COLONOSCOPY WITH PROPOFOL;  Surgeon: Jeani Hawking, MD;  Location: [facility];  Service: Gastroenterology;  Laterality: N/A;   ENDOSCOPIC MUCOSAL RESECTION  10/30/2022   Procedure: ENDOSCOPIC MUCOSAL RESECTION;  Surgeon: Jeani Hawking, MD;  Location: [facility];  Service: Gastroenterology;;   HEMOSTASIS CLIP PLACEMENT  10/30/2022   Procedure: HEMOSTASIS CLIP PLACEMENT;  Surgeon: Jeani Hawking, MD;  Location: [facility];  Service: Gastroenterology;;   NASAL SEPTUM SURGERY     POLYPECTOMY  10/30/2022   Procedure: POLYPECTOMY;  Surgeon: Jeani Hawking, MD;  Location: [facility];  Service: Gastroenterology;;   Sunnie Nielsen LIFTING INJECTION  10/30/2022   Procedure: SUBMUCOSAL LIFTING INJECTION;  Surgeon: Jeani Hawking, MD;  Location: [facility];   Service: Gastroenterology;;    REVIEW OF SYSTEMS:  Constitutional: negative Eyes: negative Ears, nose, mouth, throat, and face: negative Respiratory: negative Cardiovascular: negative Gastrointestinal: negative Genitourinary:negative Integument/breast: negative Hematologic/lymphatic: negative Musculoskeletal:negative Neurological: negative Behavioral/Psych: negative Endocrine: negative Allergic/Immunologic: negative   PHYSICAL EXAMINATION: General appearance: alert, cooperative, fatigued, and no distress Head: Normocephalic, without obvious abnormality, atraumatic Neck: no adenopathy, no JVD, supple, symmetrical, trachea midline, and thyroid not enlarged, symmetric, no tenderness/mass/nodules Lymph nodes: Cervical, supraclavicular, and axillary nodes normal. Resp: clear to auscultation bilaterally Back: symmetric, no curvature. ROM normal. No CVA tenderness. Cardio: regular rate and rhythm, S1, S2 normal, no murmur, click, rub or gallop GI: soft, non-tender; bowel sounds normal; no masses,  no organomegaly Extremities: edema trace edema left lower extremity Neurologic: Alert and oriented X 3, normal strength and tone. Normal symmetric reflexes. Normal coordination and gait  ECOG PERFORMANCE STATUS: 1 - Symptomatic but completely ambulatory  Blood pressure (!) 155/63, pulse 93, temperature 97.8  F (36.6 C), temperature source Temporal, resp. rate 17, weight 170 lb 11.2 oz (77.4 kg), SpO2 100%.  LABORATORY DATA: Lab Results  Component Value Date   WBC 5.1 03/23/2023   HGB 10.4 (L) 03/23/2023   HCT 31.6 (L) 03/23/2023   MCV 90.0 03/23/2023   PLT 465 (H) 03/23/2023      Chemistry      Component Value Date/Time   NA 138 03/23/2023 1236   NA 134 09/11/2022 1259   K 4.5 03/23/2023 1236   CL 105 03/23/2023 1236   CO2 26 03/23/2023 1236   BUN 21 03/23/2023 1236   BUN 12 09/11/2022 1259   CREATININE 0.88 03/23/2023 1236   CREATININE 0.98 10/30/2013 1118      Component  Value Date/Time   CALCIUM 9.6 03/23/2023 1236   ALKPHOS 88 03/23/2023 1236   AST 10 (L) 03/23/2023 1236   ALT 9 03/23/2023 1236   BILITOT 0.4 03/23/2023 1236       RADIOGRAPHIC STUDIES: No results found.  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  She is currently undergoing systemic chemotherapy with carboplatin for AUC of 5 on day 1 and etoposide 100 Mg/M2 on days 1, 2 and 3 status post 3 cycles.  This is concurrent with radiotherapy treated as limited stage disease because of the limited volume of her disease in the left lung.  Immunotherapy with Imfinzi will be added after completion of the radiotherapy.    Extensive stage small cell lung cancer Diagnosed in December 2024. Undergoing systemic chemotherapy with carboplatin and etoposide, concurrent with radiation. Treatment was disrupted due to illness, risking cancer progression. The cancer is aggressive, and completing the treatment regimen is crucial for control. She has tolerated the treatment well with minimal side effects, except for alopecia. Emphasized the importance of adhering to the treatment schedule to prevent rapid progression. - Continue chemotherapy regimen with carboplatin and etoposide - Schedule next chemotherapy cycle for April 26, 2023 - Plan full scan post-treatment to evaluate cancer status - Discuss potential initiation of immunotherapy post-scan  Left lower extremity swelling Reports swelling in the left foot, with tenderness in the calf. Differential diagnosis includes possible deep vein thrombosis (DVT), although clinical suspicion is low. Swelling may also be related to previous back issues or other non-thrombotic causes. A Doppler  ultrasound was offered to rule out DVT, acknowledging the low likelihood of a clot based on clinical evaluation. - Order Doppler ultrasound of the left lower extremity to rule out DVT - If Doppler is positive for clot, initiate anticoagulation therapy  Back pain and numbness Reports back pain and numbness, possibly related to a past compression fracture and degenerative changes. These symptoms may contribute to lower extremity symptoms. - Encourage follow-up with appropriate specialist for back pain management  Sinus infection Reports symptoms consistent with a sinus infection, including mucus production and recent illness that disrupted chemotherapy schedule. Plans to consult with primary care physician for management. - Encourage follow-up with primary care physician for sinus infection management  Urinary tract infection (UTI) Reports symptoms suggestive of a UTI. Plans to consult with primary care physician for management. - Encourage follow-up with primary care physician for UTI management   The patient was advised to call immediately if she  has any concerning symptoms in the interval. The patient voices understanding of current disease status and treatment options and is in agreement with the current care plan.  All questions were answered. The patient knows to call the clinic with any problems, questions or concerns. We can certainly see the patient much sooner if necessary.  The total time spent in the appointment was 30 minutes.  Disclaimer: This note was dictated with voice recognition software. Similar sounding words can inadvertently be transcribed and may not be corrected upon review.

## 2023-04-19 NOTE — Patient Instructions (Signed)
 Pt informed of appt today for [place] .

## 2023-04-20 ENCOUNTER — Telehealth: Payer: Self-pay | Admitting: Medical Oncology

## 2023-04-20 NOTE — Telephone Encounter (Signed)
 LVM that her [place] is negative for a DVT.

## 2023-04-25 NOTE — Progress Notes (Unsigned)
 [facility] OFFICE PROGRESS NOTE  [name], MD [address] 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   PRIOR THERAPY: SRS to solitary brain metastasis under the care of Dr. Kathrynn Running   CURRENT THERAPY: 1) of systemic chemotherapy with carboplatin for an AUC 5 and etoposide 100 mg/m on days 1, 2, and 3 IV every 3 weeks.  Imfinzi will be added once she completes palliative radiation.  Status post 3 cycles.  This is concurrent with radiotherapy completed on April 05, 2023.   INTERVAL HISTORY: ARBUTUS NELLIGAN 73 y.o. female returns to the clinic today for follow-up visit.  The patient was last seen in clinic on 04/19/2023 by Dr. Arbutus Ped.  The patient is currently being treated for small cell lung cancer.  She is status post 3 cycles of treatment.  Her treatment has been interrupted several times due to illnesses such as sinus infections and urinary tract infections.  Last week when she saw Dr. Arbutus Ped she was endorsing leg swelling and pain for which she underwent a Doppler ultrasound which was negative for DVT.  Since last being seen she denies any fever, chills, or night sweats.  Appetite and weight loss?  Breathing?  Shortness of breath?  Cough?  She denies any chest pain or hemoptysis.  Denies any nausea, vomiting, diarrhea, or constipation.  She denies any headache or visual changes.   She denies any rashes or skin changes.  She is here today for evaluation repeat blood work before undergoing her fourth and last cycle of systemic chemotherapy.     Saw MM on 3/24/ treatment distruped due to illness. Sinus infection and mucus. And UTI.  Swelling in left foot. Clot. History of compression fracture and ddd.   Order next scan. DVT. neg    MEDICAL HISTORY: Past Medical History:  Diagnosis Date   Allergy    Anxiety    Depression    Diabetes mellitus without complication (HCC)    Heart murmur    Hypertension    Thyroid disease    Ulcer     ALLERGIES:  has no known allergies.  MEDICATIONS:  Current Outpatient Medications  Medication Sig Dispense Refill   ALPRAZolam (XANAX) 0.5 MG tablet Take 1 tablet (0.5 mg total) by mouth 2 (two) times daily as needed for anxiety. 30 tablet 3   atorvastatin (LIPITOR) 20 MG tablet Take 1 tablet by mouth once daily 90 tablet 1   buPROPion (WELLBUTRIN XL) 300 MG 24 hr tablet Take 1 tablet by mouth once daily 90 tablet 1   busPIRone (BUSPAR) 7.5 MG tablet Take 1 tablet (7.5 mg total) by mouth 2 (two) times daily. 60 tablet 1   escitalopram (LEXAPRO) 10 MG tablet Take 1 tablet (10 mg total) by mouth daily. 90 tablet 3   gabapentin (NEURONTIN) 300 MG capsule Take 300 mg by mouth as needed (numbness).     levothyroxine (SYNTHROID) 125 MCG tablet TAKE 1 TABLET BY MOUTH ONCE DAILY BEFORE BREAKFAST 90 tablet 0   lidocaine (LIDODERM) 5 % Place 1 patch onto the skin daily. Remove & Discard patch  within 12 hours or as directed by MD 30 patch 0   lidocaine-prilocaine (EMLA) cream Apply 1 Application topically as needed. 30 g 2   metFORMIN (GLUCOPHAGE) 1000 MG tablet TAKE 1 TABLET BY MOUTH TWICE DAILY WITH A MEAL 180 tablet 1   metoprolol succinate (TOPROL-XL) 25 MG 24 hr tablet Take 1 tablet by mouth once daily 90 tablet 3   nicotine (NICODERM CQ) 21 mg/24hr patch Place 1 patch (21 mg total) onto the skin daily. 28 patch 3   ondansetron (ZOFRAN) 8 MG tablet Take 1 tablet (8 mg total) by mouth every 8 (eight) hours as needed for nausea or vomiting. 30 tablet 1   pantoprazole (PROTONIX) 40 MG tablet Take 1  tablet twice a day for 2 weeks and then daily after that 90 tablet 3   prochlorperazine (COMPAZINE) 10 MG tablet Take 1 tablet (10 mg total) by mouth every 6 (six) hours as needed for nausea or vomiting (Nausea or vomiting). 30 tablet 1   valsartan-hydrochlorothiazide (DIOVAN-HCT) 160-12.5 MG tablet Take 1 tablet by mouth once daily 90 tablet 3   No current facility-administered medications for this visit.    SURGICAL HISTORY:  Past Surgical History:  Procedure Laterality Date   BREAST SURGERY     CESAREAN SECTION     CHOLECYSTECTOMY     COLONOSCOPY WITH PROPOFOL N/A 10/30/2022   Procedure: COLONOSCOPY WITH PROPOFOL;  Surgeon: Jeani Hawking, MD;  Location: [facility];  Service: Gastroenterology;  Laterality: N/A;   ENDOSCOPIC MUCOSAL RESECTION  10/30/2022   Procedure: ENDOSCOPIC MUCOSAL RESECTION;  Surgeon: Jeani Hawking, MD;  Location: [facility];  Service: Gastroenterology;;   HEMOSTASIS CLIP PLACEMENT  10/30/2022   Procedure: HEMOSTASIS CLIP PLACEMENT;  Surgeon: Jeani Hawking, MD;  Location: [facility];  Service: Gastroenterology;;   NASAL SEPTUM SURGERY     POLYPECTOMY  10/30/2022   Procedure: POLYPECTOMY;  Surgeon: Jeani Hawking, MD;  Location: [facility];  Service: Gastroenterology;;   Sunnie Nielsen LIFTING INJECTION  10/30/2022   Procedure: SUBMUCOSAL LIFTING INJECTION;  Surgeon: Jeani Hawking, MD;  Location: [facility];  Service: Gastroenterology;;    REVIEW OF SYSTEMS:   Review of Systems  Constitutional: Negative for appetite change, chills, fatigue, fever and unexpected weight change.  HENT:   Negative for mouth sores, nosebleeds, sore throat and trouble swallowing.   Eyes: Negative for eye problems and icterus.  Respiratory: Negative for cough, hemoptysis, shortness of breath and wheezing.   Cardiovascular: Negative for chest pain and leg swelling.  Gastrointestinal: Negative for abdominal pain, constipation, diarrhea, nausea and vomiting.  Genitourinary: Negative for  bladder incontinence, difficulty urinating, dysuria, frequency and hematuria.   Musculoskeletal: Negative for back pain, gait problem, neck pain and neck stiffness.  Skin: Negative for itching and rash.  Neurological: Negative for dizziness, extremity weakness, gait problem, headaches, light-headedness and seizures.  Hematological: Negative for adenopathy. Does not bruise/bleed easily.  Psychiatric/Behavioral: Negative for confusion, depression and sleep disturbance. The patient is not nervous/anxious.     PHYSICAL EXAMINATION:  There were no vitals taken for this visit.  ECOG PERFORMANCE STATUS: {CHL ONC ECOG [id number]  Physical Exam  Constitutional: Oriented to person, place, and time and well-developed, well-nourished, and in no distress. No distress.  HENT:  Head: Normocephalic and atraumatic.  Mouth/Throat: Oropharynx is clear and moist. No oropharyngeal exudate.  Eyes: Conjunctivae are normal. Right eye exhibits no discharge. Left eye exhibits no discharge. No scleral icterus.  Neck: Normal range of motion. Neck supple.  Cardiovascular: Normal rate, regular rhythm, normal heart  sounds and intact distal pulses.   Pulmonary/Chest: Effort normal and breath sounds normal. No respiratory distress. No wheezes. No rales.  Abdominal: Soft. Bowel sounds are normal. Exhibits no distension and no mass. There is no tenderness.  Musculoskeletal: Normal range of motion. Exhibits no edema.  Lymphadenopathy:    No cervical adenopathy.  Neurological: Alert and oriented to person, place, and time. Exhibits normal muscle tone. Gait normal. Coordination normal.  Skin: Skin is warm and dry. No rash noted. Not diaphoretic. No erythema. No pallor.  Psychiatric: Mood, memory and judgment normal.  Vitals reviewed.  LABORATORY DATA: Lab Results  Component Value Date   WBC 5.1 03/23/2023   HGB 10.4 (L) 03/23/2023   HCT 31.6 (L) 03/23/2023   MCV 90.0 03/23/2023   PLT 465 (H) 03/23/2023       Chemistry      Component Value Date/Time   NA 138 03/23/2023 1236   NA 134 09/11/2022 1259   K 4.5 03/23/2023 1236   CL 105 03/23/2023 1236   CO2 26 03/23/2023 1236   BUN 21 03/23/2023 1236   BUN 12 09/11/2022 1259   CREATININE 0.88 03/23/2023 1236   CREATININE 0.98 10/30/2013 1118      Component Value Date/Time   CALCIUM 9.6 03/23/2023 1236   ALKPHOS 88 03/23/2023 1236   AST 10 (L) 03/23/2023 1236   ALT 9 03/23/2023 1236   BILITOT 0.4 03/23/2023 1236       RADIOGRAPHIC STUDIES:  VAS [place] LOWER EXTREMITY VENOUS (DVT) Result Date: 04/19/2023  Lower Venous DVT Study Patient Name:  Jasmine Long  Date of Exam:   04/19/2023 Medical Rec #: [id number]   Accession #:    [id number] Date of Birth: May 09, 1950    Patient Gender: F Patient Age:   32 years Exam Location:  [facility] Procedure:      VAS [place] LOWER EXTREMITY VENOUS (DVT) Referring Phys: [facility] MOHAMED --------------------------------------------------------------------------------  Indications: Patient complains of left leg swelling for 4 days. She denies increased SOB and chest pains.  Comparison Study: None Performing Technologist: Alecia Mackin RVT, RDCS (AE), RDMS  Examination Guidelines: A complete evaluation includes B-mode imaging, spectral Doppler, color Doppler, and power Doppler as needed of all accessible portions of each vessel. Bilateral testing is considered an integral part of a complete examination. Limited examinations for reoccurring indications may be performed as noted. The reflux portion of the exam is performed with the patient in reverse Trendelenburg.  +-----+---------------+---------+-----------+----------+--------------+ RIGHTCompressibilityPhasicitySpontaneityPropertiesThrombus Aging +-----+---------------+---------+-----------+----------+--------------+ CFV  Full           Yes      Yes                                 +-----+---------------+---------+-----------+----------+--------------+    +---------+---------------+---------+-----------+----------+--------------+ LEFT     CompressibilityPhasicitySpontaneityPropertiesThrombus Aging +---------+---------------+---------+-----------+----------+--------------+ CFV      Full           Yes      Yes                                 +---------+---------------+---------+-----------+----------+--------------+ SFJ      Full           Yes      Yes                                 +---------+---------------+---------+-----------+----------+--------------+  FV Prox  Full           Yes      Yes                                 +---------+---------------+---------+-----------+----------+--------------+ FV Mid   Full           Yes      Yes                                 +---------+---------------+---------+-----------+----------+--------------+ FV DistalFull           Yes      Yes                                 +---------+---------------+---------+-----------+----------+--------------+ PFV      Full                                                        +---------+---------------+---------+-----------+----------+--------------+ POP      Full           Yes      Yes                                 +---------+---------------+---------+-----------+----------+--------------+ PTV      Full           Yes      Yes                                 +---------+---------------+---------+-----------+----------+--------------+ PERO     Full           Yes      Yes                                 +---------+---------------+---------+-----------+----------+--------------+ Gastroc  Full                                                        +---------+---------------+---------+-----------+----------+--------------+ GSV      Full           Yes      Yes                                 +---------+---------------+---------+-----------+----------+--------------+   Findings reported to Dr. Arbutus Ped thru [facility]  email 11:25 am.  Summary: RIGHT: - No evidence of common femoral vein obstruction.   LEFT: - No evidence of deep vein thrombosis in the lower extremity. No indirect evidence of obstruction proximal to the inguinal ligament.  - No cystic structure found in the popliteal fossa.  *See table(s) above for measurements and observations. Electronically signed by Coral Else MD on 04/19/2023 at 4:15:11 PM.    Final      ASSESSMENT/PLAN:  This is  a very pleasant 73 year old Caucasian female with extensive stage (T2b, N3, M1 B) small cell lung cancer.  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  She was diagnosed in December 2024 -The patient also has early stage IV colon adenocarcinoma that was diagnosed on colonoscopy but surgical resection is currently on hold until management of her small cell lung cancer.   She is currently undergoing palliative radiation to the lung and expected to undergo SRS to the metastatic brain lesion on 02/18/2023 under the care of Dr. Kathrynn Running.   She is currently undergoing palliative systemic chemotherapy with carboplatin for an AUC of 5 on day 1, etoposide 100 mg/m on days 1, 2, and 3 IV every 3 weeks.  She is status post 3 cycles.  She did have several interruptions during her treatment secondary to illnesses such as sinusitis and UTIs.  Dr. Arbutus Ped will add Imfinzi 1500 mg on day 1 once she completes her palliative radiation which she completed on ***   The patient was seen with Dr. Arbutus Ped today.  Labs were reviewed.  Recommend that she ***with cycle #4 today scheduled.  I will arrange for restaging CT scan of the chest, abdomen, and pelvis prior to her next appointment to assess treatment response.  Adding Imfinzi?  We will see her back for follow-up visit in 3 weeks to review her scan results and discuss next  steps.   Adenocarcinoma of the colon diagnosed in July, polyp removed in October, followed by colectomy. No evidence of metastasis on recent scans. - Continue monitoring for recurrence or metastasis  The patient was advised to call immediately if she has any concerning symptoms in the interval. The patient voices understanding of current disease status and treatment options and is in agreement with the current care plan. All questions were answered. The patient knows to call the clinic with any problems, questions or concerns. We can certainly see the patient much sooner if necessary     No orders of the defined types were placed in this encounter.    I spent {CHL ONC TIME VISIT - ZOXWR:[id number]} counseling the patient face to face. The total time spent in the appointment was {CHL ONC TIME VISIT - BJYNW:[id number]}.  Angelus Hoopes L Jaken Fregia, PA-C 04/25/23

## 2023-04-26 ENCOUNTER — Inpatient Hospital Stay

## 2023-04-26 ENCOUNTER — Inpatient Hospital Stay: Admitting: Physician Assistant

## 2023-04-26 ENCOUNTER — Other Ambulatory Visit: Payer: Self-pay | Admitting: Physician Assistant

## 2023-04-26 ENCOUNTER — Encounter: Payer: Self-pay | Admitting: Internal Medicine

## 2023-04-26 VITALS — BP 163/67 | HR 89 | Temp 97.9°F | Resp 16 | Wt 175.6 lb

## 2023-04-26 DIAGNOSIS — C3412 Malignant neoplasm of upper lobe, left bronchus or lung: Secondary | ICD-10-CM

## 2023-04-26 DIAGNOSIS — F17210 Nicotine dependence, cigarettes, uncomplicated: Secondary | ICD-10-CM | POA: Diagnosis not present

## 2023-04-26 DIAGNOSIS — Z79899 Other long term (current) drug therapy: Secondary | ICD-10-CM | POA: Diagnosis not present

## 2023-04-26 DIAGNOSIS — C3400 Malignant neoplasm of unspecified main bronchus: Secondary | ICD-10-CM | POA: Diagnosis not present

## 2023-04-26 DIAGNOSIS — C7931 Secondary malignant neoplasm of brain: Secondary | ICD-10-CM | POA: Diagnosis not present

## 2023-04-26 DIAGNOSIS — C180 Malignant neoplasm of cecum: Secondary | ICD-10-CM

## 2023-04-26 DIAGNOSIS — Z5111 Encounter for antineoplastic chemotherapy: Secondary | ICD-10-CM | POA: Diagnosis not present

## 2023-04-26 LAB — CBC WITH DIFFERENTIAL (CANCER CENTER ONLY)
Abs Immature Granulocytes: 0.01 10*3/uL (ref 0.00–0.07)
Basophils Absolute: 0 10*3/uL (ref 0.0–0.1)
Basophils Relative: 0 %
Eosinophils Absolute: 0 10*3/uL (ref 0.0–0.5)
Eosinophils Relative: 0 %
HCT: 32 % — ABNORMAL LOW (ref 36.0–46.0)
Hemoglobin: 10.4 g/dL — ABNORMAL LOW (ref 12.0–15.0)
Immature Granulocytes: 0 %
Lymphocytes Relative: 11 %
Lymphs Abs: 0.5 10*3/uL — ABNORMAL LOW (ref 0.7–4.0)
MCH: 31.4 pg (ref 26.0–34.0)
MCHC: 32.5 g/dL (ref 30.0–36.0)
MCV: 96.7 fL (ref 80.0–100.0)
Monocytes Absolute: 0.5 10*3/uL (ref 0.1–1.0)
Monocytes Relative: 10 %
Neutro Abs: 3.7 10*3/uL (ref 1.7–7.7)
Neutrophils Relative %: 79 %
Platelet Count: 266 10*3/uL (ref 150–400)
RBC: 3.31 MIL/uL — ABNORMAL LOW (ref 3.87–5.11)
RDW: 18.9 % — ABNORMAL HIGH (ref 11.5–15.5)
WBC Count: 4.7 10*3/uL (ref 4.0–10.5)
nRBC: 0 % (ref 0.0–0.2)

## 2023-04-26 LAB — CMP (CANCER CENTER ONLY)
ALT: 8 U/L (ref 0–44)
AST: 11 U/L — ABNORMAL LOW (ref 15–41)
Albumin: 4.1 g/dL (ref 3.5–5.0)
Alkaline Phosphatase: 94 U/L (ref 38–126)
Anion gap: 7 (ref 5–15)
BUN: 23 mg/dL (ref 8–23)
CO2: 23 mmol/L (ref 22–32)
Calcium: 9.5 mg/dL (ref 8.9–10.3)
Chloride: 109 mmol/L (ref 98–111)
Creatinine: 1.08 mg/dL — ABNORMAL HIGH (ref 0.44–1.00)
GFR, Estimated: 55 mL/min — ABNORMAL LOW (ref >60)
Glucose, Bld: 99 mg/dL (ref 70–99)
Potassium: 4.1 mmol/L (ref 3.5–5.1)
Sodium: 139 mmol/L (ref 135–145)
Total Bilirubin: 0.4 mg/dL (ref 0.0–1.2)
Total Protein: 6.9 g/dL (ref 6.5–8.1)

## 2023-04-26 LAB — CEA (ACCESS): CEA (CHCC): 3.59 ng/mL (ref 0.00–5.00)

## 2023-04-26 MED ORDER — SODIUM CHLORIDE 0.9 % IV SOLN
100.0000 mg/m2 | Freq: Once | INTRAVENOUS | Status: AC
  Administered 2023-04-26: 200 mg via INTRAVENOUS
  Filled 2023-04-26: qty 10

## 2023-04-26 MED ORDER — SODIUM CHLORIDE 0.9 % IV SOLN
443.0000 mg | Freq: Once | INTRAVENOUS | Status: AC
  Administered 2023-04-26: 440 mg via INTRAVENOUS
  Filled 2023-04-26: qty 44

## 2023-04-26 MED ORDER — DEXAMETHASONE SODIUM PHOSPHATE 10 MG/ML IJ SOLN
10.0000 mg | Freq: Once | INTRAMUSCULAR | Status: AC
  Administered 2023-04-26: 10 mg via INTRAVENOUS
  Filled 2023-04-26: qty 1

## 2023-04-26 MED ORDER — SODIUM CHLORIDE 0.9 % IV SOLN: INTRAVENOUS | Status: DC

## 2023-04-26 MED ORDER — PALONOSETRON HCL INJECTION 0.25 MG/5ML
0.2500 mg | Freq: Once | INTRAVENOUS | Status: AC
  Administered 2023-04-26: 0.25 mg via INTRAVENOUS
  Filled 2023-04-26: qty 5

## 2023-04-26 MED ORDER — SODIUM CHLORIDE 0.9 % IV SOLN
150.0000 mg | Freq: Once | INTRAVENOUS | Status: AC
  Administered 2023-04-26: 150 mg via INTRAVENOUS
  Filled 2023-04-26: qty 150

## 2023-04-26 NOTE — Progress Notes (Signed)
 Imfinzi to be added with next cycle.  Anola Gurney [place], [place], BCPS, BCOP 04/26/2023 10:38 AM

## 2023-04-26 NOTE — Patient Instructions (Signed)
 [facility] - A DEPT OF [facility]  Discharge Instructions: Thank you for choosing [facility] to provide your oncology and hematology care.   If you have a lab appointment with the Cancer Center, please go directly to the Cancer Center and check in at the registration area.   Wear comfortable clothing and clothing appropriate for easy access to any Portacath or PICC line.   We strive to give you quality time with your provider. You may need to reschedule your appointment if you arrive late (15 or more minutes).  Arriving late affects you and other patients whose appointments are after yours.  Also, if you miss three or more appointments without notifying the office, you may be dismissed from the clinic at the provider's discretion.      For prescription refill requests, have your pharmacy contact our office and allow 72 hours for refills to be completed.    Today you received the following chemotherapy and/or immunotherapy agents: Etoposide and Carboplatin      To help prevent nausea and vomiting after your treatment, we encourage you to take your nausea medication as directed.  BELOW ARE SYMPTOMS THAT SHOULD BE REPORTED IMMEDIATELY: *FEVER GREATER THAN 100.4 F (38 C) OR HIGHER *CHILLS OR SWEATING *NAUSEA AND VOMITING THAT IS NOT CONTROLLED WITH YOUR NAUSEA MEDICATION *UNUSUAL SHORTNESS OF BREATH *UNUSUAL BRUISING OR BLEEDING *URINARY PROBLEMS (pain or burning when urinating, or frequent urination) *BOWEL PROBLEMS (unusual diarrhea, constipation, pain near the anus) TENDERNESS IN MOUTH AND THROAT WITH OR WITHOUT PRESENCE OF ULCERS (sore throat, sores in mouth, or a toothache) UNUSUAL RASH, SWELLING OR PAIN  UNUSUAL VAGINAL DISCHARGE OR ITCHING   Items with * indicate a potential emergency and should be followed up as soon as possible or go to the Emergency Department if any problems should occur.  Please show the CHEMOTHERAPY ALERT CARD  or IMMUNOTHERAPY ALERT CARD at check-in to the Emergency Department and triage nurse.  Should you have questions after your visit or need to cancel or reschedule your appointment, please contact [facility] - A DEPT OF [facility]  Dept: [phone]  and follow the prompts.  Office hours are 8:00 a.m. to 4:30 p.m. Monday - Friday. Please note that voicemails left after 4:00 p.m. may not be returned until the following business day.  We are closed weekends and major holidays. You have access to a nurse at all times for urgent questions. Please call the main number to the clinic Dept: [phone] and follow the prompts.   For any non-urgent questions, you may also contact your provider using MyChart. We now offer e-Visits for anyone 3 and older to request care online for non-urgent symptoms. For details visit [email or web address].   Also download the MyChart app! Go to the app store, search "MyChart", open the app, select [place], and log in with your MyChart username and password.

## 2023-04-27 ENCOUNTER — Inpatient Hospital Stay: Attending: Hematology

## 2023-04-27 ENCOUNTER — Ambulatory Visit
Admission: RE | Admit: 2023-04-27 | Discharge: 2023-04-27 | Disposition: A | Discharge status: Home / Self Care | Payer: Medicare HMO | Source: Ambulatory Visit | Attending: Physician Assistant | Admitting: Physician Assistant

## 2023-04-27 VITALS — BP 169/59 | HR 86 | Temp 98.1°F | Resp 16

## 2023-04-27 DIAGNOSIS — C3412 Malignant neoplasm of upper lobe, left bronchus or lung: Secondary | ICD-10-CM | POA: Insufficient documentation

## 2023-04-27 DIAGNOSIS — Z87891 Personal history of nicotine dependence: Secondary | ICD-10-CM | POA: Insufficient documentation

## 2023-04-27 DIAGNOSIS — C7931 Secondary malignant neoplasm of brain: Secondary | ICD-10-CM | POA: Insufficient documentation

## 2023-04-27 DIAGNOSIS — Z5111 Encounter for antineoplastic chemotherapy: Secondary | ICD-10-CM | POA: Diagnosis not present

## 2023-04-27 MED ORDER — SODIUM CHLORIDE 0.9 % IV SOLN
100.0000 mg/m2 | Freq: Once | INTRAVENOUS | Status: AC
  Administered 2023-04-27: 200 mg via INTRAVENOUS
  Filled 2023-04-27: qty 10

## 2023-04-27 MED ORDER — DEXAMETHASONE SODIUM PHOSPHATE 10 MG/ML IJ SOLN
10.0000 mg | Freq: Once | INTRAMUSCULAR | Status: AC
  Administered 2023-04-27: 10 mg via INTRAVENOUS
  Filled 2023-04-27: qty 1

## 2023-04-27 MED ORDER — SODIUM CHLORIDE 0.9 % IV SOLN
INTRAVENOUS | Status: DC
Start: 2023-04-27 — End: 2023-04-27

## 2023-04-27 NOTE — Patient Instructions (Signed)
 [facility]  Discharge Instructions: Thank you for choosing [facility] to provide your oncology and hematology care.   If you have a lab appointment with the Cancer Center, please go directly to the Cancer Center and check in at the registration area.   Wear comfortable clothing and clothing appropriate for easy access to any Portacath or PICC line.   We strive to give you quality time with your provider. You may need to reschedule your appointment if you arrive late (15 or more minutes).  Arriving late affects you and other patients whose appointments are after yours.  Also, if you miss three or more appointments without notifying the office, you may be dismissed from the clinic at the provider's discretion.      For prescription refill requests, have your pharmacy contact our office and allow 72 hours for refills to be completed.    Today you received the following chemotherapy and/or immunotherapy agents: Etoposide      To help prevent nausea and vomiting after your treatment, we encourage you to take your nausea medication as directed.  BELOW ARE SYMPTOMS THAT SHOULD BE REPORTED IMMEDIATELY: *FEVER GREATER THAN 100.4 F (38 C) OR HIGHER *CHILLS OR SWEATING *NAUSEA AND VOMITING THAT IS NOT CONTROLLED WITH YOUR NAUSEA MEDICATION *UNUSUAL SHORTNESS OF BREATH *UNUSUAL BRUISING OR BLEEDING *URINARY PROBLEMS (pain or burning when urinating, or frequent urination) *BOWEL PROBLEMS (unusual diarrhea, constipation, pain near the anus) TENDERNESS IN MOUTH AND THROAT WITH OR WITHOUT PRESENCE OF ULCERS (sore throat, sores in mouth, or a toothache) UNUSUAL RASH, SWELLING OR PAIN  UNUSUAL VAGINAL DISCHARGE OR ITCHING   Items with * indicate a potential emergency and should be followed up as soon as possible or go to the Emergency Department if any problems should occur.  Please show the CHEMOTHERAPY ALERT CARD or IMMUNOTHERAPY  ALERT CARD at check-in to the Emergency Department and triage nurse.  Should you have questions after your visit or need to cancel or reschedule your appointment, please contact [facility]  Dept: [phone]  and follow the prompts.  Office hours are 8:00 a.m. to 4:30 p.m. Monday - Friday. Please note that voicemails left after 4:00 p.m. may not be returned until the following business day.  We are closed weekends and major holidays. You have access to a nurse at all times for urgent questions. Please call the main number to the clinic Dept: [phone] and follow the prompts.   For any non-urgent questions, you may also contact your provider using MyChart. We now offer e-Visits for anyone 10 and older to request care online for non-urgent symptoms. For details visit [email or web address].   Also download the MyChart app! Go to the app store, search "MyChart", open the app, select [place], and log in with your MyChart username and password.

## 2023-04-28 ENCOUNTER — Other Ambulatory Visit: Payer: Self-pay | Admitting: Internal Medicine

## 2023-04-28 ENCOUNTER — Other Ambulatory Visit: Payer: Self-pay | Admitting: Physician Assistant

## 2023-04-28 ENCOUNTER — Inpatient Hospital Stay

## 2023-04-28 VITALS — BP 155/62 | HR 78 | Temp 98.0°F | Resp 18 | Wt 174.1 lb

## 2023-04-28 DIAGNOSIS — Z87891 Personal history of nicotine dependence: Secondary | ICD-10-CM | POA: Diagnosis not present

## 2023-04-28 DIAGNOSIS — Z5111 Encounter for antineoplastic chemotherapy: Secondary | ICD-10-CM

## 2023-04-28 DIAGNOSIS — C3412 Malignant neoplasm of upper lobe, left bronchus or lung: Secondary | ICD-10-CM | POA: Diagnosis not present

## 2023-04-28 DIAGNOSIS — C7931 Secondary malignant neoplasm of brain: Secondary | ICD-10-CM | POA: Diagnosis not present

## 2023-04-28 MED ORDER — DEXAMETHASONE 4 MG PO TABS
10.0000 mg | ORAL_TABLET | Freq: Once | ORAL | Status: AC
  Administered 2023-04-28: 10 mg via ORAL
  Filled 2023-04-28: qty 3

## 2023-04-28 MED ORDER — HEPARIN SOD (PORK) LOCK FLUSH 100 UNIT/ML IV SOLN: 500.0000 [IU] | Freq: Once | INTRAVENOUS | Status: DC | PRN

## 2023-04-28 MED ORDER — DEXAMETHASONE SODIUM PHOSPHATE 10 MG/ML IJ SOLN
10.0000 mg | Freq: Once | INTRAMUSCULAR | Status: DC
Start: 2023-04-28 — End: 2023-04-28

## 2023-04-28 MED ORDER — SODIUM CHLORIDE 0.9 % IV SOLN: INTRAVENOUS | Status: DC

## 2023-04-28 MED ORDER — ETOPOSIDE CHEMO INJECTION 1 GM/50ML
100.0000 mg/m2 | Freq: Once | INTRAVENOUS | Status: AC
  Administered 2023-04-28: 200 mg via INTRAVENOUS
  Filled 2023-04-28: qty 10

## 2023-04-28 MED ORDER — SODIUM CHLORIDE 0.9% FLUSH
10.0000 mL | INTRAVENOUS | Status: DC | PRN
Start: 2023-04-28 — End: 2023-04-28

## 2023-04-28 NOTE — Patient Instructions (Signed)
 [facility]  Discharge Instructions: Thank you for choosing [facility] to provide your oncology and hematology care.   If you have a lab appointment with the Cancer Center, please go directly to the Cancer Center and check in at the registration area.   Wear comfortable clothing and clothing appropriate for easy access to any Portacath or PICC line.   We strive to give you quality time with your provider. You may need to reschedule your appointment if you arrive late (15 or more minutes).  Arriving late affects you and other patients whose appointments are after yours.  Also, if you miss three or more appointments without notifying the office, you may be dismissed from the clinic at the provider's discretion.      For prescription refill requests, have your pharmacy contact our office and allow 72 hours for refills to be completed.    Today you received the following chemotherapy and/or immunotherapy agents: Etoposide      To help prevent nausea and vomiting after your treatment, we encourage you to take your nausea medication as directed.  BELOW ARE SYMPTOMS THAT SHOULD BE REPORTED IMMEDIATELY: *FEVER GREATER THAN 100.4 F (38 C) OR HIGHER *CHILLS OR SWEATING *NAUSEA AND VOMITING THAT IS NOT CONTROLLED WITH YOUR NAUSEA MEDICATION *UNUSUAL SHORTNESS OF BREATH *UNUSUAL BRUISING OR BLEEDING *URINARY PROBLEMS (pain or burning when urinating, or frequent urination) *BOWEL PROBLEMS (unusual diarrhea, constipation, pain near the anus) TENDERNESS IN MOUTH AND THROAT WITH OR WITHOUT PRESENCE OF ULCERS (sore throat, sores in mouth, or a toothache) UNUSUAL RASH, SWELLING OR PAIN  UNUSUAL VAGINAL DISCHARGE OR ITCHING   Items with * indicate a potential emergency and should be followed up as soon as possible or go to the Emergency Department if any problems should occur.  Please show the CHEMOTHERAPY ALERT CARD or IMMUNOTHERAPY  ALERT CARD at check-in to the Emergency Department and triage nurse.  Should you have questions after your visit or need to cancel or reschedule your appointment, please contact [facility]  Dept: [phone]  and follow the prompts.  Office hours are 8:00 a.m. to 4:30 p.m. Monday - Friday. Please note that voicemails left after 4:00 p.m. may not be returned until the following business day.  We are closed weekends and major holidays. You have access to a nurse at all times for urgent questions. Please call the main number to the clinic Dept: [phone] and follow the prompts.   For any non-urgent questions, you may also contact your provider using MyChart. We now offer e-Visits for anyone 10 and older to request care online for non-urgent symptoms. For details visit [email or web address].   Also download the MyChart app! Go to the app store, search "MyChart", open the app, select [place], and log in with your MyChart username and password.

## 2023-04-29 ENCOUNTER — Telehealth: Payer: Self-pay | Admitting: Internal Medicine

## 2023-04-29 NOTE — Telephone Encounter (Signed)
 Scheduled out immunotherapy appointments and rescheduled labs around a CT scan. The patient is aware of the appointments scheduled.

## 2023-05-03 ENCOUNTER — Encounter: Payer: Self-pay | Admitting: Emergency Medicine

## 2023-05-03 ENCOUNTER — Ambulatory Visit (INDEPENDENT_AMBULATORY_CARE_PROVIDER_SITE_OTHER): Admitting: Emergency Medicine

## 2023-05-03 ENCOUNTER — Inpatient Hospital Stay

## 2023-05-03 VITALS — BP 154/64 | HR 101 | Temp 99.0°F | Ht 69.0 in | Wt 171.0 lb

## 2023-05-03 DIAGNOSIS — R300 Dysuria: Secondary | ICD-10-CM | POA: Diagnosis not present

## 2023-05-03 DIAGNOSIS — F419 Anxiety disorder, unspecified: Secondary | ICD-10-CM | POA: Diagnosis not present

## 2023-05-03 DIAGNOSIS — E1169 Type 2 diabetes mellitus with other specified complication: Secondary | ICD-10-CM

## 2023-05-03 DIAGNOSIS — C180 Malignant neoplasm of cecum: Secondary | ICD-10-CM | POA: Diagnosis not present

## 2023-05-03 DIAGNOSIS — J329 Chronic sinusitis, unspecified: Secondary | ICD-10-CM | POA: Diagnosis not present

## 2023-05-03 DIAGNOSIS — E785 Hyperlipidemia, unspecified: Secondary | ICD-10-CM

## 2023-05-03 DIAGNOSIS — Z7984 Long term (current) use of oral hypoglycemic drugs: Secondary | ICD-10-CM

## 2023-05-03 DIAGNOSIS — C3412 Malignant neoplasm of upper lobe, left bronchus or lung: Secondary | ICD-10-CM | POA: Diagnosis not present

## 2023-05-03 DIAGNOSIS — I10 Essential (primary) hypertension: Secondary | ICD-10-CM

## 2023-05-03 DIAGNOSIS — F418 Other specified anxiety disorders: Secondary | ICD-10-CM

## 2023-05-03 LAB — POC URINALSYSI DIPSTICK (AUTOMATED)
Bilirubin, UA: NEGATIVE
Blood, UA: NEGATIVE
Glucose, UA: NEGATIVE
Ketones, UA: NEGATIVE
Leukocytes, UA: NEGATIVE
Nitrite, UA: NEGATIVE
Protein, UA: NEGATIVE
Spec Grav, UA: 1.02 (ref 1.010–1.025)
Urobilinogen, UA: 0.2 U/dL
pH, UA: 6 (ref 5.0–8.0)

## 2023-05-03 LAB — URINALYSIS
Bilirubin Urine: NEGATIVE
Hgb urine dipstick: NEGATIVE
Ketones, ur: NEGATIVE
Leukocytes,Ua: NEGATIVE
Nitrite: NEGATIVE
Specific Gravity, Urine: 1.02 (ref 1.000–1.030)
Total Protein, Urine: NEGATIVE
Urine Glucose: NEGATIVE
Urobilinogen, UA: 0.2 (ref 0.0–1.0)
pH: 6 (ref 5.0–8.0)

## 2023-05-03 NOTE — Progress Notes (Signed)
 Pt left me a VM on 4/5 stating that she will not be coming in to have her labs drawn because her "arms are blue up and down, there are knots in the places I've been stuck, my arms are done." I called both the pts number. Left Vm on home phone. I will request the pt get set up with a port.

## 2023-05-03 NOTE — Patient Instructions (Signed)
 Health Maintenance After Age 71 After age 4, you are at a higher risk for certain long-term diseases and infections as well as injuries from falls. Falls are a major cause of broken bones and head injuries in people who are older than age 47. Getting regular preventive care can help to keep you healthy and well. Preventive care includes getting regular testing and making lifestyle changes as recommended by your health care provider. Talk with your health care provider about: Which screenings and tests you should have. A screening is a test that checks for a disease when you have no symptoms. A diet and exercise plan that is right for you. What should I know about screenings and tests to prevent falls? Screening and testing are the best ways to find a health problem early. Early diagnosis and treatment give you the best chance of managing medical conditions that are common after age 37. Certain conditions and lifestyle choices may make you more likely to have a fall. Your health care provider may recommend: Regular vision checks. Poor vision and conditions such as cataracts can make you more likely to have a fall. If you wear glasses, make sure to get your prescription updated if your vision changes. Medicine review. Work with your health care provider to regularly review all of the medicines you are taking, including over-the-counter medicines. Ask your health care provider about any side effects that may make you more likely to have a fall. Tell your health care provider if any medicines that you take make you feel dizzy or sleepy. Strength and balance checks. Your health care provider may recommend certain tests to check your strength and balance while standing, walking, or changing positions. Foot health exam. Foot pain and numbness, as well as not wearing proper footwear, can make you more likely to have a fall. Screenings, including: Osteoporosis screening. Osteoporosis is a condition that causes  the bones to get weaker and break more easily. Blood pressure screening. Blood pressure changes and medicines to control blood pressure can make you feel dizzy. Depression screening. You may be more likely to have a fall if you have a fear of falling, feel depressed, or feel unable to do activities that you used to do. Alcohol use screening. Using too much alcohol can affect your balance and may make you more likely to have a fall. Follow these instructions at home: Lifestyle Do not drink alcohol if: Your health care provider tells you not to drink. If you drink alcohol: Limit how much you have to: 0-1 drink a day for women. 0-2 drinks a day for men. Know how much alcohol is in your drink. In the [place] one drink equals one 12 oz bottle of beer (355 mL), one 5 oz glass of wine (148 mL), or one 1 oz glass of hard liquor (44 mL). Do not use any products that contain nicotine or tobacco. These products include cigarettes, chewing tobacco, and vaping devices, such as e-cigarettes. If you need help quitting, ask your health care provider. Activity  Follow a regular exercise program to stay fit. This will help you maintain your balance. Ask your health care provider what types of exercise are appropriate for you. If you need a cane or walker, use it as recommended by your health care provider. Wear supportive shoes that have nonskid soles. Safety  Remove any tripping hazards, such as rugs, cords, and clutter. Install safety equipment such as grab bars in bathrooms and safety rails on stairs. Keep rooms and walkways  well-lit. General instructions Talk with your health care provider about your risks for falling. Tell your health care provider if: You fall. Be sure to tell your health care provider about all falls, even ones that seem minor. You feel dizzy, tiredness (fatigue), or off-balance. Take over-the-counter and prescription medicines only as told by your health care provider. These include  supplements. Eat a healthy diet and maintain a healthy weight. A healthy diet includes low-fat dairy products, low-fat (lean) meats, and fiber from whole grains, beans, and lots of fruits and vegetables. Stay current with your vaccines. Schedule regular health, dental, and eye exams. Summary Having a healthy lifestyle and getting preventive care can help to protect your health and wellness after age 11. Screening and testing are the best way to find a health problem early and help you avoid having a fall. Early diagnosis and treatment give you the best chance for managing medical conditions that are more common for people who are older than age 28. Falls are a major cause of broken bones and head injuries in people who are older than age 48. Take precautions to prevent a fall at home. Work with your health care provider to learn what changes you can make to improve your health and wellness and to prevent falls. This information is not intended to replace advice given to you by your health care provider. Make sure you discuss any questions you have with your health care provider. Document Revised: 06/03/2020 Document Reviewed: 06/03/2020 Elsevier Patient Education  2024 [employer].

## 2023-05-03 NOTE — Assessment & Plan Note (Signed)
 Treatment on hold given metastatic lung cancer diagnosis and need for treatment

## 2023-05-03 NOTE — Assessment & Plan Note (Signed)
 Chronic stable conditions Continue metformin 1000 mg twice a day and atorvastatin 20 mg daily

## 2023-05-03 NOTE — Assessment & Plan Note (Signed)
 BP Readings from Last 3 Encounters:  05/03/23 (!) 154/64  04/28/23 (!) 155/62  04/27/23 (!) 169/59  Normal blood pressure readings at home Continue valsartan HCT 160-12.5 mg daily Also on metoprolol succinate 25 mg daily

## 2023-05-03 NOTE — Assessment & Plan Note (Signed)
 Clinically stable. Undergoing chemotherapy Had radiation therapy Responding well to treatment No complications.

## 2023-05-03 NOTE — Assessment & Plan Note (Signed)
 Clinically stable. Continues daily Lexapro 10 mg and BuSpar 7.5 mg twice a day. Alprazolam as needed.

## 2023-05-03 NOTE — Assessment & Plan Note (Signed)
 Chronic stable condition. No active infection today No concerns

## 2023-05-03 NOTE — Assessment & Plan Note (Signed)
 Negative urinalysis and minimal symptoms Urine sent for culture We will wait for results before starting antibiotics History of severe diarrhea secondary to antibiotics No signs of pyelonephritis.  Afebrile. No concerns at this time

## 2023-05-03 NOTE — Progress Notes (Signed)
 Jasmine Long 73 y.o.   Chief Complaint  Patient presents with   Sinus Problem    Patient states this sinus infection has gotten worse within the last couple of the week. UTI symptoms started 2 weeks ago, she is have some burning, some foamy urine.    Leg Swelling    Patient states having left leg swelling she did have [place] on 04/19/23, no clots     HISTORY OF PRESENT ILLNESS: This is a 73 y.o. female here for follow-up of chronic medical conditions Also has a history of chronic sinusitis.  Chronic nasal discharge History of recent UTIs History of leg swelling.  Had negative ultrasound on 04/19/2023.  No DVT. No other complaints or medical concerns today.  Sinus Problem Associated symptoms include congestion. Pertinent negatives include no chills, coughing, headaches, shortness of breath or sore throat.     Prior to Admission medications   Medication Sig Start Date End Date Taking? Authorizing Provider  ALPRAZolam Prudy Feeler) 0.5 MG tablet Take 1 tablet (0.5 mg total) by mouth 2 (two) times daily as needed for anxiety. 02/09/23  Yes Erine Phenix, Eilleen Kempf, MD  atorvastatin (LIPITOR) 20 MG tablet Take 1 tablet by mouth once daily 08/18/22  Yes Jeannetta Cerutti, Eilleen Kempf, MD  buPROPion (WELLBUTRIN XL) 300 MG 24 hr tablet Take 1 tablet by mouth once daily 08/18/22  Yes Abdulraheem Pineo, Eilleen Kempf, MD  busPIRone (BUSPAR) 7.5 MG tablet Take 1 tablet (7.5 mg total) by mouth 2 (two) times daily. 07/16/22  Yes Kermit Arnette, Eilleen Kempf, MD  gabapentin (NEURONTIN) 300 MG capsule Take 300 mg by mouth as needed (numbness). 08/14/22  Yes [provider]  levothyroxine (SYNTHROID) 125 MCG tablet TAKE 1 TABLET BY MOUTH ONCE DAILY BEFORE BREAKFAST 04/14/22  Yes Mckayla Mulcahey, Eilleen Kempf, MD  lidocaine (LIDODERM) 5 % Place 1 patch onto the skin daily. Remove & Discard patch within 12 hours or as directed by MD 01/12/23  Yes Loetta Rough, MD  lidocaine-prilocaine (EMLA) cream Apply 1 Application topically as needed. 02/04/23   Yes Heilingoetter, Cassandra L, PA-C  metFORMIN (GLUCOPHAGE) 1000 MG tablet TAKE 1 TABLET BY MOUTH TWICE DAILY WITH A MEAL 08/18/22  Yes Elizjah Noblet, Eilleen Kempf, MD  metoprolol succinate (TOPROL-XL) 25 MG 24 hr tablet Take 1 tablet by mouth once daily 05/27/22  Yes Kathaleen Dudziak, Marianne, MD  nicotine (NICODERM CQ) 21 mg/24hr patch Place 1 patch (21 mg total) onto the skin daily. 11/26/22  Yes Dnyla Antonetti, Eilleen Kempf, MD  ondansetron (ZOFRAN) 8 MG tablet Take 1 tablet (8 mg total) by mouth every 8 (eight) hours as needed for nausea or vomiting. 01/15/23  Yes Bruning, Ashlyn, PA-C  pantoprazole (PROTONIX) 40 MG tablet Take 1 tablet twice a day for 2 weeks and then daily after that 08/08/22  Yes Lowry Bala, Eilleen Kempf, MD  valsartan-hydrochlorothiazide (DIOVAN-HCT) 160-12.5 MG tablet Take 1 tablet by mouth once daily 05/27/22  Yes Jamonica Schoff, Eilleen Kempf, MD  escitalopram (LEXAPRO) 10 MG tablet Take 1 tablet (10 mg total) by mouth daily. 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   Weight loss 05/13/2022  Change in multiple pigmented skin lesions 04/22/2022   Paresthesia of both lower extremities 04/22/2022   Abnormal MRI, lumbar spine 04/22/2022   Vitreous floaters of right eye 04/07/2022   Hematoma of scalp 12/25/2021   Smokers' cough (HCC) 12/26/2020   Chronic bilateral low back pain with left-sided sciatica 12/26/2020   Chronic sinus complaints 12/26/2020   Chronic anxiety 06/28/2019   Acute recurrent maxillary sinusitis 10/14/2017   History of peptic ulcer 04/30/2017   Hyperlipidemia  12/11/2015   Insomnia 12/11/2015   Panic attack as reaction to stress 12/11/2015   Current smoker 12/11/2015   Chronic sinusitis 12/11/2015   Dyslipidemia associated with type 2 diabetes mellitus (HCC) 05/15/2015   Hypothyroid 04/29/2011   BMI 40.0-44.9, adult (HCC) 04/29/2011   Depression with anxiety 04/29/2011   GERD (gastroesophageal reflux disease) 04/29/2011   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   Nicotine addiction 04/29/2011   Essential hypertension 04/29/2011   Heart murmur 04/29/2011    Past Medical History:  Diagnosis Date   Allergy    Anxiety    Depression    Diabetes mellitus without complication (HCC)    Heart murmur    Hypertension    Thyroid disease    Ulcer     Past Surgical History:  Procedure Laterality Date   BREAST SURGERY     CESAREAN SECTION     CHOLECYSTECTOMY     COLONOSCOPY WITH PROPOFOL N/A 10/30/2022   Procedure: COLONOSCOPY WITH PROPOFOL;  Surgeon: Jeani Hawking, MD;  Location: [facility];  Service: Gastroenterology;  Laterality: N/A;   ENDOSCOPIC MUCOSAL RESECTION  10/30/2022   Procedure: ENDOSCOPIC MUCOSAL RESECTION;  Surgeon: Jeani Hawking, MD;  Location: [facility];  Service: Gastroenterology;;   HEMOSTASIS CLIP PLACEMENT  10/30/2022   Procedure: HEMOSTASIS CLIP PLACEMENT;  Surgeon: Jeani Hawking, MD;  Location: [facility];  Service: Gastroenterology;;   NASAL SEPTUM SURGERY     POLYPECTOMY  10/30/2022   Procedure: POLYPECTOMY;  Surgeon: Jeani Hawking, MD;  Location: [facility];  Service: Gastroenterology;;   Sunnie Nielsen LIFTING INJECTION  10/30/2022   Procedure: SUBMUCOSAL LIFTING INJECTION;  Surgeon: Jeani Hawking, MD;  Location: [facility];  Service: Gastroenterology;;    Social History   Socioeconomic History   Marital status: Divorced    Spouse name: Not on file   Number of children: 2   Years of education: Not on file   Highest education level: Bachelor's degree (e.g., BA, AB, BS)  Occupational History   Occupation:  Unemployed  Tobacco Use   Smoking status: Every Day    Current packs/day: 0.00    Average packs/day: 1.5 packs/day for 40.0 years (60.0 ttl pk-yrs)    Types: Cigarettes    Start date: 01/20/1972    Last attempt to quit: 01/20/2012    Years since quitting: 11.2   Smokeless tobacco: Never   Tobacco comments:    Pt doing E-cigarette as of 01/20/12  Vaping Use   Vaping status: Former  Substance and Sexual Activity   Alcohol use: No   Drug use: No   Sexual activity: Never  Other Topics Concern   Not on file  Social History Narrative   Patient on worker's compensation for an injury sustained on the job 5 years ago.      Right handed and Left Handed       Lives in a two story home       One son deceased.    Social Drivers of Health   Financial Resource Strain: Low Risk  (01/09/2023)  Overall Financial Resource Strain (CARDIA)    Difficulty of Paying Living Expenses: Not hard at all  Food Insecurity: No Food Insecurity (01/12/2023)   Hunger Vital Sign    Worried About Running Out of Food in the Last Year: Never true    Ran Out of Food in the Last Year: Never true  Transportation Needs: No Transportation Needs (01/12/2023)   PRAPARE - Administrator, Civil Service (Medical): No    Lack of Transportation (Non-Medical): No  Physical Activity: Inactive (01/09/2023)   Exercise Vital Sign    Days of Exercise per Week: 0 days    Minutes of Exercise per Session: 0 min  Stress: Stress Concern Present (01/09/2023)   [employer] of Occupational Health - Occupational Stress Questionnaire    Feeling of Stress : Very much  Social Connections: Socially Isolated (01/09/2023)   Social Connection and Isolation Panel [NHANES]    Frequency of Communication with Friends and Family: Twice a week    Frequency of Social Gatherings with Friends and Family: Never    Attends Religious Services: Never    Database administrator or Organizations: No    Attends Banker  Meetings: Never    Marital Status: Divorced  Catering manager Violence: Not At Risk (01/12/2023)   Humiliation, Afraid, Rape, and Kick questionnaire    Fear of Current or Ex-Partner: No    Emotionally Abused: No    Physically Abused: No    Sexually Abused: No    Family History  Problem Relation Age of Onset   Lung cancer Mother 28   Dementia Father    Atrial fibrillation Father    Cancer Paternal Aunt        breast cancer   Colon cancer Paternal Aunt    Colon cancer Cousin      Review of Systems  Constitutional: Negative.  Negative for chills and fever.  HENT:  Positive for congestion. Negative for sore throat.   Respiratory: Negative.  Negative for cough and shortness of breath.   Cardiovascular: Negative.  Negative for chest pain and palpitations.  Gastrointestinal:  Negative for abdominal pain, diarrhea, nausea and vomiting.  Genitourinary:  Negative for dysuria and hematuria.  Skin: Negative.  Negative for rash.  Neurological: Negative.  Negative for dizziness and headaches.  All other systems reviewed and are negative.   Vitals:   05/03/23 1313  BP: (!) 154/64  Pulse: (!) 101  Temp: 99 F (37.2 C)  SpO2: 97%    Physical Exam Vitals reviewed.  Constitutional:      Appearance: Normal appearance.  HENT:     Head: Normocephalic.     Mouth/Throat:     Mouth: Mucous membranes are moist.     Pharynx: Oropharynx is clear.  Eyes:     Extraocular Movements: Extraocular movements intact.  Cardiovascular:     Rate and Rhythm: Normal rate and regular rhythm.     Pulses: Normal pulses.     Heart sounds: Normal heart sounds.  Pulmonary:     Effort: Pulmonary effort is normal.     Breath sounds: Normal breath sounds.  Abdominal:     Palpations: Abdomen is soft.     Tenderness: There is no abdominal tenderness.  Musculoskeletal:     Right lower leg: No edema.     Left lower leg: No edema.  Skin:    General: Skin is warm and dry.     Capillary Refill: Capillary  refill takes less than 2 seconds.  Neurological:     General: No focal deficit present.     Mental Status: She is alert and oriented to person, place, and time.  Psychiatric:        Mood and Affect: Mood normal.        Behavior: Behavior normal.    Results for orders placed or performed in visit on 05/03/23 (from the past 24 hours)  POCT Urinalysis Dipstick (Automated)     Status: None   Collection Time: 05/03/23  2:09 PM  Result Value Ref Range   Color, UA yellow    Clarity, UA clear    Glucose, UA Negative Negative   Bilirubin, UA negative    Ketones, UA negative    Spec Grav, UA 1.020 1.010 - 1.025   Blood, UA negative    pH, UA 6.0 5.0 - 8.0   Protein, UA Negative Negative   Urobilinogen, UA 0.2 0.2 or 1.0 E.U./dL   Nitrite, UA negative    Leukocytes, UA Negative Negative  Urinalysis     Status: None   Collection Time: 05/03/23  2:36 PM  Result Value Ref Range   Color, Urine YELLOW Yellow;Lt. Yellow;Straw;Dark Yellow;Amber;Green;Red;Brown   APPearance CLEAR Clear;Turbid;Slightly Cloudy;Cloudy   Specific Gravity, Urine 1.020 1.000 - 1.030   pH 6.0 5.0 - 8.0   Total Protein, Urine NEGATIVE Negative   Urine Glucose NEGATIVE Negative   Ketones, ur NEGATIVE Negative   Bilirubin Urine NEGATIVE Negative   Hgb urine dipstick NEGATIVE Negative   Urobilinogen, UA 0.2 0.0 - 1.0   Leukocytes,Ua NEGATIVE Negative   Nitrite NEGATIVE Negative      ASSESSMENT & PLAN: A total of 45 minutes was spent with the patient and counseling/coordination of care regarding preparing for this visit, review of most recent office visit notes, review of multiple chronic medical conditions and their management, review of all medications, review of most recent bloodwork results, review of health maintenance items, education on nutrition, prognosis, documentation, and need for follow up.   Problem List Items Addressed This Visit       Cardiovascular and Mediastinum   Essential hypertension   BP  Readings from Last 3 Encounters:  05/03/23 (!) 154/64  04/28/23 (!) 155/62  04/27/23 (!) 169/59  Normal blood pressure readings at home Continue valsartan HCT 160-12.5 mg daily Also on metoprolol succinate 25 mg daily         Respiratory   Chronic sinusitis   Chronic stable condition. No active infection today No concerns      Small cell lung cancer (HCC)   Clinically stable. Undergoing chemotherapy Had radiation therapy Responding well to treatment No complications.        Digestive   Adenocarcinoma of cecum (HCC)   Treatment on hold given metastatic lung cancer diagnosis and need for treatment         Endocrine   Dyslipidemia associated with type 2 diabetes mellitus (HCC)   Chronic stable conditions Continue metformin 1000 mg twice a day and atorvastatin 20 mg daily        Other   Depression with anxiety   Chronic but stable Continues BuSpar 7.5 mg twice a day and Lexapro 10 mg daily Uses alprazolam as needed      Chronic anxiety   Clinically stable. Continues daily Lexapro 10 mg and BuSpar 7.5 mg twice a day. Alprazolam as needed.       Dysuria - Primary   Negative urinalysis and minimal symptoms Urine sent for culture We will wait for  results before starting antibiotics History of severe diarrhea secondary to antibiotics No signs of pyelonephritis.  Afebrile. No concerns at this time      Relevant Orders   POCT Urinalysis Dipstick (Automated) (Completed)   Patient Instructions  Health Maintenance After Age 11 After age 25, you are at a higher risk for certain long-term diseases and infections as well as injuries from falls. Falls are a major cause of broken bones and head injuries in people who are older than age 78. Getting regular preventive care can help to keep you healthy and well. Preventive care includes getting regular testing and making lifestyle changes as recommended by your health care provider. Talk with your health care provider  about: Which screenings and tests you should have. A screening is a test that checks for a disease when you have no symptoms. A diet and exercise plan that is right for you. What should I know about screenings and tests to prevent falls? Screening and testing are the best ways to find a health problem early. Early diagnosis and treatment give you the best chance of managing medical conditions that are common after age 51. Certain conditions and lifestyle choices may make you more likely to have a fall. Your health care provider may recommend: Regular vision checks. Poor vision and conditions such as cataracts can make you more likely to have a fall. If you wear glasses, make sure to get your prescription updated if your vision changes. Medicine review. Work with your health care provider to regularly review all of the medicines you are taking, including over-the-counter medicines. Ask your health care provider about any side effects that may make you more likely to have a fall. Tell your health care provider if any medicines that you take make you feel dizzy or sleepy. Strength and balance checks. Your health care provider may recommend certain tests to check your strength and balance while standing, walking, or changing positions. Foot health exam. Foot pain and numbness, as well as not wearing proper footwear, can make you more likely to have a fall. Screenings, including: Osteoporosis screening. Osteoporosis is a condition that causes the bones to get weaker and break more easily. Blood pressure screening. Blood pressure changes and medicines to control blood pressure can make you feel dizzy. Depression screening. You may be more likely to have a fall if you have a fear of falling, feel depressed, or feel unable to do activities that you used to do. Alcohol use screening. Using too much alcohol can affect your balance and may make you more likely to have a fall. Follow these instructions at  home: Lifestyle Do not drink alcohol if: Your health care provider tells you not to drink. If you drink alcohol: Limit how much you have to: 0-1 drink a day for women. 0-2 drinks a day for men. Know how much alcohol is in your drink. In the [place] one drink equals one 12 oz bottle of beer (355 mL), one 5 oz glass of wine (148 mL), or one 1 oz glass of hard liquor (44 mL). Do not use any products that contain nicotine or tobacco. These products include cigarettes, chewing tobacco, and vaping devices, such as e-cigarettes. If you need help quitting, ask your health care provider. Activity  Follow a regular exercise program to stay fit. This will help you maintain your balance. Ask your health care provider what types of exercise are appropriate for you. If you need a cane or walker, use it as recommended  by your health care provider. Wear supportive shoes that have nonskid soles. Safety  Remove any tripping hazards, such as rugs, cords, and clutter. Install safety equipment such as grab bars in bathrooms and safety rails on stairs. Keep rooms and walkways well-lit. General instructions Talk with your health care provider about your risks for falling. Tell your health care provider if: You fall. Be sure to tell your health care provider about all falls, even ones that seem minor. You feel dizzy, tiredness (fatigue), or off-balance. Take over-the-counter and prescription medicines only as told by your health care provider. These include supplements. Eat a healthy diet and maintain a healthy weight. A healthy diet includes low-fat dairy products, low-fat (lean) meats, and fiber from whole grains, beans, and lots of fruits and vegetables. Stay current with your vaccines. Schedule regular health, dental, and eye exams. Summary Having a healthy lifestyle and getting preventive care can help to protect your health and wellness after age 32. Screening and testing are the best way to find a health  problem early and help you avoid having a fall. Early diagnosis and treatment give you the best chance for managing medical conditions that are more common for people who are older than age 28. Falls are a major cause of broken bones and head injuries in people who are older than age 14. Take precautions to prevent a fall at home. Work with your health care provider to learn what changes you can make to improve your health and wellness and to prevent falls. This information is not intended to replace advice given to you by your health care provider. Make sure you discuss any questions you have with your health care provider. Document Revised: 06/03/2020 Document Reviewed: 06/03/2020 Elsevier Patient Education  2024 Elsevier Inc.     Edwina Barth, MD [facility]

## 2023-05-03 NOTE — Assessment & Plan Note (Signed)
Chronic but stable Continues BuSpar 7.5 mg twice a day and Lexapro 10 mg daily Uses alprazolam as needed

## 2023-05-04 LAB — URINE CULTURE: Result:: NO GROWTH

## 2023-05-05 ENCOUNTER — Encounter: Payer: Self-pay | Admitting: Emergency Medicine

## 2023-05-10 ENCOUNTER — Inpatient Hospital Stay

## 2023-05-11 ENCOUNTER — Encounter

## 2023-05-11 ENCOUNTER — Ambulatory Visit (HOSPITAL_COMMUNITY)
Admission: RE | Admit: 2023-05-11 | Discharge: 2023-05-11 | Disposition: A | Discharge status: Home / Self Care | Source: Ambulatory Visit | Attending: Physician Assistant | Admitting: Physician Assistant

## 2023-05-11 ENCOUNTER — Inpatient Hospital Stay

## 2023-05-11 DIAGNOSIS — C3412 Malignant neoplasm of upper lobe, left bronchus or lung: Secondary | ICD-10-CM | POA: Insufficient documentation

## 2023-05-11 DIAGNOSIS — R911 Solitary pulmonary nodule: Secondary | ICD-10-CM | POA: Diagnosis not present

## 2023-05-11 DIAGNOSIS — K5730 Diverticulosis of large intestine without perforation or abscess without bleeding: Secondary | ICD-10-CM | POA: Diagnosis not present

## 2023-05-11 DIAGNOSIS — R918 Other nonspecific abnormal finding of lung field: Secondary | ICD-10-CM | POA: Diagnosis not present

## 2023-05-11 MED ORDER — IOHEXOL 300 MG/ML  SOLN
100.0000 mL | Freq: Once | INTRAMUSCULAR | Status: AC | PRN
  Administered 2023-05-11: 100 mL via INTRAVENOUS

## 2023-05-11 MED ORDER — SODIUM CHLORIDE (PF) 0.9 % IJ SOLN
INTRAMUSCULAR | Status: AC
Start: 2023-05-11 — End: ?
  Filled 2023-05-11: qty 50

## 2023-05-12 ENCOUNTER — Other Ambulatory Visit (HOSPITAL_COMMUNITY): Payer: Self-pay | Admitting: Student

## 2023-05-12 DIAGNOSIS — S22080A Wedge compression fracture of T11-T12 vertebra, initial encounter for closed fracture: Secondary | ICD-10-CM

## 2023-05-14 NOTE — Progress Notes (Signed)
 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 Lorri Rota   CURRENT THERAPY: 1) Palliative systemic chemotherapy with carboplatin  for an AUC 5 and etoposide  100 mg/m on days 1, 2, and 3 IV every 3 weeks.  Imfinzi will be added once she completes palliative radiation.  Status post 4 cycles.  This is concurrent with radiotherapy completed on April 05, 2023.   INTERVAL HISTORY: [name] 73 y.o. female returns to the clinic today for a follow-up visit. The patient was last seen by myself 3 weeks ago. She is being treated with systemic chemotherapy/immunotherapy for her lung cancer. She completed her 4th cycle 3 weeks ago. I arranged for a restaging CT scan of the CAP to assess treatment response so we can review the results at her appointment today before starting consolidation immunotherapy with Imfinzi, which is scheduled to start today.   She refused to come for her weekly labs  The patient has been interested in seeing [facility] for a second opinion for which we previously let her know we are all right for her to transfer her care there if she would be happier there. We placed the referral back in December when the patient was just establishing care in our clinic. She has a consultation on ***. She seems to be under the impression that we have kept results from her despite the fact I have reiterated on multiple  occasions that she has not had any imaging studies performed by oncology since starting treatment besides the recent imaging that we are reviewing at her appointment today.   She denies fever, chills, or night sweats.  She gained some weight since last being seen.  She reports her breathing is "fine".  She denies any dyspnea on exertion.  She sometimes has cough related to phlegm and sinuses.  She states she struggles with sinus infections frequently.  She saw her PCP for sinus infection and Uti recently. She denies any hemoptysis.  Denies any nausea, vomiting, diarrhea, or constipation at this time.  Denies any headache or visual changes.  The patient is scheduled for her next brain MRI on 06/02/2023.  She did mention that she has a rash just localized on her chest with some itching.  She thinks it secondary to a washcloth with soap that she used recently and she has known skin sensitivities with certain soaps. She recently had a restaging CT scan performed.  He is here today for evaluation repeat blood work before undergoing cycle #5    MEDICAL HISTORY: Past Medical History:  Diagnosis Date   Allergy    Anxiety    Depression    Diabetes mellitus without complication (HCC)    Heart murmur    Hypertension    Thyroid  disease    Ulcer     ALLERGIES:  has no known allergies.  MEDICATIONS:  Current Outpatient Medications  Medication Sig Dispense Refill   ALPRAZolam  (XANAX ) 0.5 MG tablet Take 1 tablet (  0.5 mg total) by mouth 2 (two) times daily as needed for anxiety. 30 tablet 3   atorvastatin  (LIPITOR) 20 MG tablet Take 1 tablet by mouth once daily 90 tablet 1   buPROPion  (WELLBUTRIN  XL) 300 MG 24 hr tablet Take 1 tablet by mouth once daily 90 tablet 1   busPIRone  (BUSPAR ) 7.5 MG tablet Take 1 tablet (7.5 mg total) by mouth 2 (two) times daily. 60 tablet 1   escitalopram  (LEXAPRO ) 10 MG tablet Take 1 tablet (10 mg total) by mouth daily. 90 tablet 3   gabapentin (NEURONTIN) 300 MG capsule Take  300 mg by mouth as needed (numbness).     levothyroxine  (SYNTHROID ) 125 MCG tablet TAKE 1 TABLET BY MOUTH ONCE DAILY BEFORE BREAKFAST 90 tablet 0   lidocaine  (LIDODERM ) 5 % Place 1 patch onto the skin daily. Remove & Discard patch within 12 hours or as directed by MD 30 patch 0   lidocaine -prilocaine  (EMLA ) cream Apply 1 Application topically as needed. 30 g 2   metFORMIN  (GLUCOPHAGE ) 1000 MG tablet TAKE 1 TABLET BY MOUTH TWICE DAILY WITH A MEAL 180 tablet 1   metoprolol  succinate (TOPROL -XL) 25 MG 24 hr tablet Take 1 tablet by mouth once daily 90 tablet 3   nicotine  (NICODERM CQ ) 21 mg/24hr patch Place 1 patch (21 mg total) onto the skin daily. 28 patch 3   ondansetron  (ZOFRAN ) 8 MG tablet Take 1 tablet (8 mg total) by mouth every 8 (eight) hours as needed for nausea or vomiting. 30 tablet 1   pantoprazole  (PROTONIX ) 40 MG tablet Take 1 tablet twice a day for 2 weeks and then daily after that 90 tablet 3   valsartan -hydrochlorothiazide  (DIOVAN -HCT) 160-12.5 MG tablet Take 1 tablet by mouth once daily 90 tablet 3   No current facility-administered medications for this visit.    SURGICAL HISTORY:  Past Surgical History:  Procedure Laterality Date   BREAST SURGERY     CESAREAN SECTION     CHOLECYSTECTOMY     COLONOSCOPY WITH PROPOFOL  N/A 10/30/2022   Procedure: COLONOSCOPY WITH PROPOFOL ;  Surgeon: Alvis Jourdain, MD;  Location: [facility];  Service: Gastroenterology;  Laterality: N/A;   ENDOSCOPIC MUCOSAL RESECTION  10/30/2022   Procedure: ENDOSCOPIC MUCOSAL RESECTION;  Surgeon: Alvis Jourdain, MD;  Location: [facility];  Service: Gastroenterology;;   HEMOSTASIS CLIP PLACEMENT  10/30/2022   Procedure: HEMOSTASIS CLIP PLACEMENT;  Surgeon: Alvis Jourdain, MD;  Location: [facility];  Service: Gastroenterology;;   NASAL SEPTUM SURGERY     POLYPECTOMY  10/30/2022   Procedure: POLYPECTOMY;  Surgeon: Alvis Jourdain, MD;  Location: [facility];  Service: Gastroenterology;;   [place] LIFTING  INJECTION  10/30/2022   Procedure: SUBMUCOSAL LIFTING INJECTION;  Surgeon: Alvis Jourdain, MD;  Location: [facility];  Service: Gastroenterology;;    REVIEW OF SYSTEMS:   Review of Systems  Constitutional: Negative for appetite change, chills, fatigue, fever and unexpected weight change.  HENT:   Negative for mouth sores, nosebleeds, sore throat and trouble swallowing.   Eyes: Negative for eye problems and icterus.  Respiratory: Negative for cough, hemoptysis, shortness of breath and wheezing.   Cardiovascular: Negative for chest pain and leg swelling.  Gastrointestinal: Negative for abdominal pain, constipation, diarrhea, nausea and vomiting.  Genitourinary: Negative for bladder incontinence, difficulty urinating, dysuria, frequency and hematuria.   Musculoskeletal: Negative for back pain, gait problem, neck pain and neck stiffness.  Skin: Negative for itching and rash.  Neurological: Negative for dizziness, extremity weakness, gait problem, headaches, light-headedness and seizures.  Hematological: Negative for adenopathy. Does not bruise/bleed easily.  Psychiatric/Behavioral: Negative for confusion, depression and sleep disturbance. The patient is not nervous/anxious.     PHYSICAL EXAMINATION:  There were no vitals taken for this visit.  ECOG PERFORMANCE STATUS: {CHL ONC ECOG [id number]  Physical Exam  Constitutional: Oriented to person, place, and time and well-developed, well-nourished, and in no distress. No distress.  HENT:  Head: Normocephalic and atraumatic.  Mouth/Throat: Oropharynx is clear and moist. No oropharyngeal exudate.  Eyes: Conjunctivae are normal. Right eye exhibits no discharge. Left eye exhibits no discharge. No scleral icterus.  Neck: Normal range of motion. Neck supple.  Cardiovascular: Normal rate, regular rhythm, normal heart sounds and intact distal pulses.   Pulmonary/Chest: Effort normal and breath sounds normal. No respiratory distress. No wheezes.  No rales.  Abdominal: Soft. Bowel sounds are normal. Exhibits no distension and no mass. There is no tenderness.  Musculoskeletal: Normal range of motion. Exhibits no edema.  Lymphadenopathy:    No cervical adenopathy.  Neurological: Alert and oriented to person, place, and time. Exhibits normal muscle tone. Gait normal. Coordination normal.  Skin: Skin is warm and dry. No rash noted. Not diaphoretic. No erythema. No pallor.  Psychiatric: Mood, memory and judgment normal.  Vitals reviewed.  LABORATORY DATA: Lab Results  Component Value Date   WBC 4.7 04/26/2023   HGB 10.4 (L) 04/26/2023   HCT 32.0 (L) 04/26/2023   MCV 96.7 04/26/2023   PLT 266 04/26/2023      Chemistry      Component Value Date/Time   NA 139 04/26/2023 0820   NA 134 09/11/2022 1259   K 4.1 04/26/2023 0820   CL 109 04/26/2023 0820   CO2 23 04/26/2023 0820   BUN 23 04/26/2023 0820   BUN 12 09/11/2022 1259   CREATININE 1.08 (H) 04/26/2023 0820   CREATININE 0.98 10/30/2013 1118      Component Value Date/Time   CALCIUM  9.5 04/26/2023 0820   ALKPHOS 94 04/26/2023 0820   AST 11 (L) 04/26/2023 0820   ALT 8 04/26/2023 0820   BILITOT 0.4 04/26/2023 0820       RADIOGRAPHIC STUDIES:  CT CHEST ABDOMEN PELVIS W CONTRAST Result Date: 05/14/2023 CLINICAL DATA:  Metastatic disease evaluation, lung cancer, chemotherapy and XRT complete * Tracking Code: BO * EXAM: CT CHEST, ABDOMEN, AND PELVIS WITH CONTRAST TECHNIQUE: Multidetector CT imaging of the chest, abdomen and pelvis was performed following the standard protocol during bolus administration of intravenous contrast. RADIATION DOSE REDUCTION: This exam was performed according to the departmental dose-optimization program which includes automated exposure control, adjustment of the mA and/or kV according to patient size and/or use of iterative reconstruction technique. CONTRAST:  OMNIPAQUE  IOHEXOL  300 MG/ML  SOLN COMPARISON:  PET-CT, 01/08/2023 FINDINGS: CT CHEST  FINDINGS Cardiovascular: Aortic atherosclerosis. Dense aortic valve calcifications. Normal heart size. No pericardial effusion. Mediastinum/Nodes: No persistently enlarged left hilar, mediastinal, or included lower cervical lymph nodes, previously FDG avid nodes resolved. Thyroid  gland, trachea, and esophagus demonstrate no significant findings. Lungs/Pleura: Significantly diminished size of a previously PET avid subpleural mass of the peripheral left upper lobe, measuring 3.1 x 1.0 cm, previously 5.2 x 2.8 cm (series 4, image 66). More solid appearance of a small, previously cavitary nodule of the posterior left apex, measuring 0.5 cm (series 4, image 39). Minimal, predominantly paraseptal emphysema. Diffuse bilateral bronchial wall thickening. No pleural effusion or pneumothorax. Musculoskeletal: No chest wall abnormality. No acute osseous findings. CT ABDOMEN PELVIS FINDINGS Hepatobiliary: No focal liver  abnormality is seen. Status post cholecystectomy. No biliary dilatation. Pancreas: Unremarkable. No pancreatic ductal dilatation or surrounding inflammatory changes. Spleen: Normal in size without significant abnormality. Adrenals/Urinary Tract: Adrenal glands are unremarkable. Kidneys are normal, without renal calculi, solid lesion, or hydronephrosis. Bladder is unremarkable. Stomach/Bowel: Stomach is within normal limits. Appendix appears normal. No evidence of bowel wall thickening, distention, or inflammatory changes. Descending and sigmoid diverticulosis, severe in the sigmoid. Vascular/Lymphatic: Aortic atherosclerosis. No enlarged abdominal or pelvic lymph nodes. Reproductive: No mass or other abnormality. Other: No abdominal wall hernia or abnormality. No ascites. Musculoskeletal: No acute osseous findings. IMPRESSION: 1. Significantly diminished size of a previously PET avid subpleural mass of the peripheral left upper lobe, consistent with treatment response. 2. No persistently enlarged left hilar,  mediastinal, or included lower cervical lymph nodes, previously FDG avid nodes resolved consistent with treatment response of nodal metastatic disease. 3. More solid appearance of a small, previously cavitary nodule of the posterior left upper lobe, measuring 0.5 cm. This is of uncertain significance. Attention on follow-up. 4. No evidence of lymphadenopathy or metastatic disease in the abdomen or pelvis. 5. Patient's known primary cecal malignancy is not clearly visible by CT. 6. Emphysema and diffuse bilateral bronchial wall thickening. 7. Dense aortic valve calcifications. Correlate for echocardiographic evidence of aortic valve dysfunction. 8. Descending and sigmoid diverticulosis, severe in the sigmoid. Aortic Atherosclerosis (ICD10-I70.0) and Emphysema (ICD10-J43.9). Electronically Signed   By: Fredricka Jenny M.D.   On: 05/14/2023 15:20   VAS US  LOWER EXTREMITY VENOUS (DVT) Result Date: 04/19/2023  Lower Venous DVT Study Patient Name:  Jasmine Long  Date of Exam:   04/19/2023 Medical Rec #: [id number]   Accession #:    [id number] Date of Birth: 07-Sep-1950    Patient Gender: F Patient Age:   73 years Exam Location:  [facility] Procedure:      VAS US  LOWER EXTREMITY VENOUS (DVT) Referring Phys: [facility] MOHAMED --------------------------------------------------------------------------------  Indications: Patient complains of left leg swelling for 4 days. She denies increased SOB and chest pains.  Comparison Study: None Performing Technologist: Alecia Mackin RVT, RDCS (AE), RDMS  Examination Guidelines: A complete evaluation includes B-mode imaging, spectral Doppler, color Doppler, and power Doppler as needed of all accessible portions of each vessel. Bilateral testing is considered an integral part of a complete examination. Limited examinations for reoccurring indications may be performed as noted. The reflux portion of the exam is performed with the patient in reverse Trendelenburg.   +-----+---------------+---------+-----------+----------+--------------+ RIGHTCompressibilityPhasicitySpontaneityPropertiesThrombus Aging +-----+---------------+---------+-----------+----------+--------------+ CFV  Full           Yes      Yes                                 +-----+---------------+---------+-----------+----------+--------------+   +---------+---------------+---------+-----------+----------+--------------+ LEFT     CompressibilityPhasicitySpontaneityPropertiesThrombus Aging +---------+---------------+---------+-----------+----------+--------------+ CFV      Full           Yes      Yes                                 +---------+---------------+---------+-----------+----------+--------------+ SFJ      Full           Yes      Yes                                 +---------+---------------+---------+-----------+----------+--------------+  FV Prox  Full           Yes      Yes                                 +---------+---------------+---------+-----------+----------+--------------+ FV Mid   Full           Yes      Yes                                 +---------+---------------+---------+-----------+----------+--------------+ FV DistalFull           Yes      Yes                                 +---------+---------------+---------+-----------+----------+--------------+ PFV      Full                                                        +---------+---------------+---------+-----------+----------+--------------+ POP      Full           Yes      Yes                                 +---------+---------------+---------+-----------+----------+--------------+ PTV      Full           Yes      Yes                                 +---------+---------------+---------+-----------+----------+--------------+ PERO     Full           Yes      Yes                                  +---------+---------------+---------+-----------+----------+--------------+ Gastroc  Full                                                        +---------+---------------+---------+-----------+----------+--------------+ GSV      Full           Yes      Yes                                 +---------+---------------+---------+-----------+----------+--------------+   Findings reported to Dr. Marguerita Shih thru [facility] email 11:25 am.  Summary: RIGHT: - No evidence of common femoral vein obstruction.   LEFT: - No evidence of deep vein thrombosis in the lower extremity. No indirect evidence of obstruction proximal to the inguinal ligament.  - No cystic structure found in the popliteal fossa.  *See table(s) above for measurements and observations. Electronically signed by Genny Kid MD on 04/19/2023 at 4:15:11 PM.    Final      ASSESSMENT/PLAN:  This is  a very pleasant 73 year old Caucasian female with extensive stage (T2b, N3, M1 B) small cell lung cancer.  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  She was diagnosed in December 2024 -The patient also has early stage IV colon adenocarcinoma that was diagnosed on colonoscopy but surgical resection is currently on hold until management of her small cell lung cancer.   She is currently undergoing palliative radiation to the lung and expected to undergo SRS to the metastatic brain lesion on 02/18/2023 under the care of Dr. Lorri Rota.   She is currently undergoing palliative systemic chemotherapy with carboplatin  for an AUC of 5 on day 1, etoposide  100 mg/m on days 1, 2, and 3 IV every 3 weeks.  She is status post 4 cycles.  She did have several interruptions during her treatment secondary to illnesses such as sinusitis and UTIs.  Dr. Marguerita Shih will add Imfinzi 1500 mg on day 1 once she completes her palliative radiation  which she completed early march 2025.   ***resume treatment at [facility].   The patient was seen with Dr. Marguerita Shih today.  Dr. Marguerita Shih personally and independently reviewed the scan and discussed results with the patient today.  The scan showed ***.  Dr. Marguerita Shih recommends *** she proceed with cycle #5 which is single agent immunotherapy with Imfinzi.    Labs were reviewed.  Recommend that she proceed with cycle #5 today scheduled  We will see her back in 4 weeks for evaluation and repeat blood work before undergoing cycle #6.   The rash she was advised to use hydrocortisone cream for itching if needed.   Adenocarcinoma of the colon diagnosed in July, polyp removed in October, followed by colectomy. No evidence of metastasis on recent scans. - Continue monitoring for recurrence or metastasis   The patient was advised to call immediately if she has any concerning symptoms in the interval. The patient voices understanding of current disease status and treatment options and is in agreement with the current care plan. All questions were answered. The patient knows to call the clinic with any problems, questions or concerns. We can certainly see the patient much sooner if necessary       No orders of the defined types were placed in this encounter.    I spent {CHL ONC TIME VISIT - XLKGM:[id number]} counseling the patient face to face. The total time spent in the appointment was {CHL ONC TIME VISIT - [id number]}.  Briseida Gittings L Savina Olshefski, PA-C 05/14/23

## 2023-05-17 ENCOUNTER — Inpatient Hospital Stay

## 2023-05-17 ENCOUNTER — Inpatient Hospital Stay (HOSPITAL_BASED_OUTPATIENT_CLINIC_OR_DEPARTMENT_OTHER): Admitting: Physician Assistant

## 2023-05-17 VITALS — BP 149/60 | HR 91 | Temp 97.9°F | Resp 18 | Wt 178.0 lb

## 2023-05-17 DIAGNOSIS — Z5112 Encounter for antineoplastic immunotherapy: Secondary | ICD-10-CM | POA: Insufficient documentation

## 2023-05-17 DIAGNOSIS — C3412 Malignant neoplasm of upper lobe, left bronchus or lung: Secondary | ICD-10-CM

## 2023-05-17 DIAGNOSIS — Z7189 Other specified counseling: Secondary | ICD-10-CM

## 2023-05-17 DIAGNOSIS — C7931 Secondary malignant neoplasm of brain: Secondary | ICD-10-CM | POA: Diagnosis not present

## 2023-05-17 DIAGNOSIS — Z87891 Personal history of nicotine dependence: Secondary | ICD-10-CM | POA: Diagnosis not present

## 2023-05-17 DIAGNOSIS — Z5111 Encounter for antineoplastic chemotherapy: Secondary | ICD-10-CM | POA: Diagnosis not present

## 2023-05-17 NOTE — Progress Notes (Signed)
 Spoke to [place] in radiology. Requested pt imaging from 11/27/2022 until 05/11/2023 be pushed to [facility] so they're available for [facility] once pt has made a consult appt.

## 2023-05-19 ENCOUNTER — Ambulatory Visit (HOSPITAL_COMMUNITY)
Admission: RE | Admit: 2023-05-19 | Discharge: 2023-05-19 | Disposition: A | Discharge status: Home / Self Care | Source: Ambulatory Visit | Attending: Student | Admitting: Student

## 2023-05-19 DIAGNOSIS — S22080A Wedge compression fracture of T11-T12 vertebra, initial encounter for closed fracture: Secondary | ICD-10-CM

## 2023-05-22 ENCOUNTER — Ambulatory Visit (HOSPITAL_COMMUNITY)
Admission: RE | Admit: 2023-05-22 | Discharge: 2023-05-22 | Disposition: A | Discharge status: Home / Self Care | Source: Ambulatory Visit | Attending: Student | Admitting: Student

## 2023-05-22 DIAGNOSIS — R609 Edema, unspecified: Secondary | ICD-10-CM | POA: Diagnosis not present

## 2023-05-22 DIAGNOSIS — S22080A Wedge compression fracture of T11-T12 vertebra, initial encounter for closed fracture: Secondary | ICD-10-CM | POA: Insufficient documentation

## 2023-05-22 DIAGNOSIS — M48061 Spinal stenosis, lumbar region without neurogenic claudication: Secondary | ICD-10-CM | POA: Diagnosis not present

## 2023-05-22 DIAGNOSIS — M47816 Spondylosis without myelopathy or radiculopathy, lumbar region: Secondary | ICD-10-CM | POA: Diagnosis not present

## 2023-05-22 DIAGNOSIS — R29890 Loss of height: Secondary | ICD-10-CM | POA: Diagnosis not present

## 2023-05-27 ENCOUNTER — Telehealth: Payer: Self-pay | Admitting: Radiation Therapy

## 2023-05-27 NOTE — Telephone Encounter (Signed)
 I received a message that Jasmine Long had cancelled the brain MRI set up for her. Reason given was a scheduling conflict, but the scan was not rescheduled. I called to see when she is open to reschedule. No answer, left a message requesting a call back and also included the phone number for [facility] so she can get that scan rescheduled when she is available.   Axel Bohr R.T.(R)(T) Radiation Special Procedures Lead

## 2023-06-01 ENCOUNTER — Telehealth: Payer: Self-pay | Admitting: Radiation Therapy

## 2023-06-01 NOTE — Telephone Encounter (Signed)
 I called Jasmine Long again to check in about the cancelled MRI. She said the reason for the exam being cancelled and not rescheduled is that her son is experiencing a relapse and currently in behavioral health center again. She does not know when he may be better or need her so she does not feel comfortable rescheduling the scan yet. She dose have a second opinion visit with [facility] to discuss possible medical oncology, systemic treatment trials that may not be offered here. This visit is scheduled for Friday 5/9. She has made arrangements to attend this appointment.   Jasmine Long has the number for [facility] and will reschedule the MRI when things have settled down and she feels she will be able to attend. I offered setting this up for her but she did not want that because she did not want the hassle of having to cancel again.   Axel Bohr R.T(R)(T) Radiation Special Procedures Lead

## 2023-06-02 ENCOUNTER — Other Ambulatory Visit: Payer: Medicare HMO

## 2023-06-04 ENCOUNTER — Other Ambulatory Visit (HOSPITAL_COMMUNITY): Payer: Medicare HMO

## 2023-06-04 DIAGNOSIS — C189 Malignant neoplasm of colon, unspecified: Secondary | ICD-10-CM | POA: Diagnosis not present

## 2023-06-04 DIAGNOSIS — C7931 Secondary malignant neoplasm of brain: Secondary | ICD-10-CM | POA: Diagnosis not present

## 2023-06-04 DIAGNOSIS — F17200 Nicotine dependence, unspecified, uncomplicated: Secondary | ICD-10-CM | POA: Diagnosis not present

## 2023-06-04 DIAGNOSIS — C3490 Malignant neoplasm of unspecified part of unspecified bronchus or lung: Secondary | ICD-10-CM | POA: Diagnosis not present

## 2023-06-04 DIAGNOSIS — C3412 Malignant neoplasm of upper lobe, left bronchus or lung: Secondary | ICD-10-CM | POA: Diagnosis not present

## 2023-06-09 ENCOUNTER — Ambulatory Visit: Payer: Medicare HMO | Admitting: Urology

## 2023-06-14 ENCOUNTER — Ambulatory Visit: Admitting: Internal Medicine

## 2023-06-14 ENCOUNTER — Ambulatory Visit

## 2023-06-14 ENCOUNTER — Other Ambulatory Visit

## 2023-06-17 ENCOUNTER — Inpatient Hospital Stay: Attending: Hematology | Admitting: Internal Medicine

## 2023-06-17 VITALS — BP 144/56 | HR 80 | Temp 97.9°F | Resp 15 | Wt 175.7 lb

## 2023-06-17 DIAGNOSIS — C3412 Malignant neoplasm of upper lobe, left bronchus or lung: Secondary | ICD-10-CM | POA: Insufficient documentation

## 2023-06-17 DIAGNOSIS — Z85038 Personal history of other malignant neoplasm of large intestine: Secondary | ICD-10-CM | POA: Diagnosis not present

## 2023-06-17 DIAGNOSIS — Z9049 Acquired absence of other specified parts of digestive tract: Secondary | ICD-10-CM | POA: Diagnosis not present

## 2023-06-17 DIAGNOSIS — Z79899 Other long term (current) drug therapy: Secondary | ICD-10-CM | POA: Insufficient documentation

## 2023-06-17 DIAGNOSIS — C7931 Secondary malignant neoplasm of brain: Secondary | ICD-10-CM | POA: Diagnosis not present

## 2023-06-17 NOTE — Progress Notes (Signed)
 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    PRIOR THERAPY:  1) SRS to solitary brain metastasis under the care of Dr. Lorri Rota  2) systemic chemotherapy with carboplatin  for an AUC 5 and etoposide  100 mg/m on days 1, 2, and 3 IV every 3 weeks.  Imfinzi will be added once she completes palliative radiation.  Status post 4 cycles.  This is concurrent with radiotherapy completed on April 05, 2023.  CURRENT THERAPY: Consolidation treatment with immunotherapy with Imfinzi 1500 Mg IV every 4 weeks.  First dose 07/13/2023.  INTERVAL HISTORY: Jasmine Long 73 y.o. female returns to the clinic today for follow-up visit.Discussed the use of AI scribe software for clinical note transcription with the patient, who gave verbal consent to proceed.  History of Present Illness   Jasmine Long is a 73 year old female with extensive stage small cell lung cancer and early stage colon adenocarcinoma who presents for evaluation and discussion of future treatment options.  She was diagnosed with extensive stage small cell lung cancer in December 2024, with a suspicious solitary brain metastasis. She has undergone stereotactic radiosurgery to the brain metastasis and systemic chemotherapy with carboplatin  and etoposide , completing four cycles with a good response. She has no new complaints except for hair regrowth not matching her desired color. She is  concerned about potential side effects of immunotherapy, including inflammation in various organs.  She has a history of early stage colon adenocarcinoma, which was surgically treated. Her gastroenterologist was confident that the cancer was completely excised.  She has a history of back pain related to the T12 vertebra and has an upcoming appointment with a neurosurgeon on June 12th. She has undergone an MRI but missed a recent appointment due to transportation issues.  She is concerned about her veins and is considering getting a port for treatment administration. She is worried about potential infection risks.  She plans to be away at her beach house until June 10th, which affects the scheduling of her treatment.        MEDICAL HISTORY: Past Medical History:  Diagnosis Date   Allergy    Anxiety    Depression    Diabetes mellitus without complication (HCC)    Heart murmur    Hypertension    Thyroid  disease    Ulcer     ALLERGIES:  has no known allergies.  MEDICATIONS:  Current Outpatient Medications  Medication Sig Dispense Refill   ALPRAZolam  (XANAX ) 0.5 MG tablet Take 1 tablet (0.5 mg total) by mouth 2 (two) times daily as needed for anxiety. 30 tablet 3   atorvastatin  (LIPITOR) 20 MG tablet Take 1 tablet by mouth once daily 90 tablet 1   buPROPion  (WELLBUTRIN  XL) 300 MG 24 hr tablet Take 1 tablet by mouth once daily 90 tablet 1   busPIRone  (BUSPAR ) 7.5 MG tablet Take 1 tablet (7.5 mg total) by mouth 2 (two) times daily. 60 tablet 1   escitalopram  (  LEXAPRO ) 10 MG tablet Take 1 tablet (10 mg total) by mouth daily. 90 tablet 3   gabapentin (NEURONTIN) 300 MG capsule Take 300 mg by mouth as needed (numbness).     levothyroxine  (SYNTHROID ) 125 MCG tablet TAKE 1 TABLET BY MOUTH ONCE DAILY BEFORE BREAKFAST 90 tablet 0   lidocaine  (LIDODERM ) 5 % Place 1 patch onto the skin daily. Remove & Discard patch within 12 hours or as directed by MD 30 patch 0   lidocaine -prilocaine  (EMLA )  cream Apply 1 Application topically as needed. 30 g 2   metFORMIN  (GLUCOPHAGE ) 1000 MG tablet TAKE 1 TABLET BY MOUTH TWICE DAILY WITH A MEAL 180 tablet 1   metoprolol  succinate (TOPROL -XL) 25 MG 24 hr tablet Take 1 tablet by mouth once daily 90 tablet 3   nicotine  (NICODERM CQ ) 21 mg/24hr patch Place 1 patch (21 mg total) onto the skin daily. 28 patch 3   ondansetron  (ZOFRAN ) 8 MG tablet Take 1 tablet (8 mg total) by mouth every 8 (eight) hours as needed for nausea or vomiting. 30 tablet 1   pantoprazole  (PROTONIX ) 40 MG tablet Take 1 tablet twice a day for 2 weeks and then daily after that 90 tablet 3   valsartan -hydrochlorothiazide  (DIOVAN -HCT) 160-12.5 MG tablet Take 1 tablet by mouth once daily 90 tablet 3   No current facility-administered medications for this visit.    SURGICAL HISTORY:  Past Surgical History:  Procedure Laterality Date   BREAST SURGERY     CESAREAN SECTION     CHOLECYSTECTOMY     COLONOSCOPY WITH PROPOFOL  N/A 10/30/2022   Procedure: COLONOSCOPY WITH PROPOFOL ;  Surgeon: Alvis Jourdain, MD;  Location: [facility];  Service: Gastroenterology;  Laterality: N/A;   ENDOSCOPIC MUCOSAL RESECTION  10/30/2022   Procedure: ENDOSCOPIC MUCOSAL RESECTION;  Surgeon: Alvis Jourdain, MD;  Location: [facility];  Service: Gastroenterology;;   HEMOSTASIS CLIP PLACEMENT  10/30/2022   Procedure: HEMOSTASIS CLIP PLACEMENT;  Surgeon: Alvis Jourdain, MD;  Location: [facility];  Service: Gastroenterology;;   NASAL SEPTUM SURGERY     POLYPECTOMY  10/30/2022   Procedure: POLYPECTOMY;  Surgeon: Alvis Jourdain, MD;  Location: [facility];  Service: Gastroenterology;;   [place] LIFTING INJECTION  10/30/2022   Procedure: SUBMUCOSAL LIFTING INJECTION;  Surgeon: Alvis Jourdain, MD;  Location: [facility];  Service: Gastroenterology;;    REVIEW OF SYSTEMS:  Constitutional: positive for fatigue Eyes: negative Ears, nose, mouth, throat, and face: negative Respiratory: negative Cardiovascular:  negative Gastrointestinal: negative Genitourinary:negative Integument/breast: negative Hematologic/lymphatic: negative Musculoskeletal:negative Neurological: negative Behavioral/Psych: negative Endocrine: negative Allergic/Immunologic: negative   PHYSICAL EXAMINATION: General appearance: alert, cooperative, fatigued, and no distress Head: Normocephalic, without obvious abnormality, atraumatic Neck: no adenopathy, no JVD, supple, symmetrical, trachea midline, and thyroid  not enlarged, symmetric, no tenderness/mass/nodules Lymph nodes: Cervical, supraclavicular, and axillary nodes normal. Resp: clear to auscultation bilaterally Back: symmetric, no curvature. ROM normal. No CVA tenderness. Cardio: regular rate and rhythm, S1, S2 normal, no murmur, click, rub or gallop GI: soft, non-tender; bowel sounds normal; no masses,  no organomegaly Extremities: edema trace edema left lower extremity Neurologic: Alert and oriented X 3, normal strength and tone. Normal symmetric reflexes. Normal coordination and gait  ECOG PERFORMANCE STATUS: 1 - Symptomatic but completely ambulatory  Blood pressure (!) 144/56, pulse 80, temperature 97.9 F (36.6 C), temperature source Temporal, resp. rate 15, weight 175 lb 11.2 oz (79.7 kg), SpO2 99%.  LABORATORY DATA: Lab Results  Component Value Date   WBC 4.7 04/26/2023   HGB 10.4 (L) 04/26/2023  HCT 32.0 (L) 04/26/2023   MCV 96.7 04/26/2023   PLT 266 04/26/2023      Chemistry      Component Value Date/Time   NA 139 04/26/2023 0820   NA 134 09/11/2022 1259   K 4.1 04/26/2023 0820   CL 109 04/26/2023 0820   CO2 23 04/26/2023 0820   BUN 23 04/26/2023 0820   BUN 12 09/11/2022 1259   CREATININE 1.08 (H) 04/26/2023 0820   CREATININE 0.98 10/30/2013 1118      Component Value Date/Time   CALCIUM  9.5 04/26/2023 0820   ALKPHOS 94 04/26/2023 0820   AST 11 (L) 04/26/2023 0820   ALT 8 04/26/2023 0820   BILITOT 0.4 04/26/2023 0820        RADIOGRAPHIC STUDIES: MR LUMBAR SPINE WO CONTRAST Result Date: 06/14/2023 CLINICAL DATA:  T12 compression fracture EXAM: MRI LUMBAR SPINE WITHOUT CONTRAST TECHNIQUE: Multiplanar, multisequence MR imaging of the lumbar spine was performed. No intravenous contrast was administered. COMPARISON:  CT of the lumbar spine dated 01/12/2023 FINDINGS: Segmentation: Standard. Alignment:  Physiologic lumbar alignment is maintained. Vertebrae: Vertebral bodies demonstrate normal signal intensity. Subacute compression fracture at T12 with mild retropulsion into the canal resulting in mild canal stenosis. There is approximately 60% height loss anteriorly. Associated edema suggesting subacute injury. Conus medullaris and cauda equina: The conus medullaris terminates at the level of L1-L2. The distal spinal cord signal intensity is normal. Paraspinal and other soft tissues: The visualized abdomen and pelvis show no soft tissue abnormality. The visualized aorta is normal. Disc levels: L1-L2: Disc is normal in configuration. Mild bilateral facet arthropathy. No neuroforaminal stenosis. No spinal canal stenosis. L2-L3: Disc bulge. Mild bilateral facet arthropathy. No neuroforaminal stenosis. No spinal canal stenosis. L3-L4: Disc bulge. Moderate bilateral facet arthropathy. Mild right neuroforaminal stenosis. No spinal canal stenosis. L4-L5: Disc bulge. Severe bilateral facet arthropathy. No neuroforaminal stenosis. No spinal canal stenosis. L5-S1: Disc is normal in configuration. Moderate left and mild right facet arthropathy. No neuroforaminal stenosis. No spinal canal stenosis. IMPRESSION: 1. Subacute compression fracture at T12 with mild retropulsion into the canal. Approximately 60% height loss anteriorly. Mild canal stenosis at this level. 2. Mild right foraminal stenosis at L3-L4 secondary to disc bulge and facet arthropathy. Electronically Signed   By: Johnanna Mylar M.D.   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  She is currently undergoing systemic chemotherapy with carboplatin  for AUC of 5 on day 1 and etoposide  100 Mg/M2 on days 1, 2 and 3 status post 4 cycles.  This is concurrent with radiotherapy treated as limited stage disease because of the limited volume of her disease in the left lung.  Immunotherapy with Imfinzi will be added after completion of the radiotherapy.    Extensive stage small cell lung cancer with brain metastasis Diagnosed in December 2024 with extensive stage small cell lung cancer and a suspicious solitary brain metastasis. Post stereotactic radiosurgery to the brain metastasis and systemic chemotherapy with carboplatin  and etoposide , she showed a good response after four cycles. Discussed initiating durvalumab (Imfinzi) immunotherapy to prolong life and improve disease control. Explained that immunotherapy enhances T cell activity against cancer cells and lacks chemotherapy side effects like alopecia. Potential side effects  include organs inflammation. She agreed to start immunotherapy post-trip. Discussed port placement for treatment administration, noting rare risks such as infection. Provided port information in the after-visit summary. Discussed life expectancy, noting average survival for extensive stage small cell lung cancer is approximately 13 months, with potential extension through immunotherapy, and some individuals achieving significantly longer survival. - Initiate durvalumab (Imfinzi) immunotherapy after June 10th. - Include port information in the after-visit summary. - Schedule immunotherapy at the local facility post-June 10th. - Conduct scans every three months to monitor  disease progression.  Early stage colon adenocarcinoma Previously treated with surgery, with Dr. Nickey Barn reporting a 99.9% certainty of complete resection. Emphasized the importance of continued surveillance for recurrence, as CT scans are not infallible for detecting colon cancer. - Coordinate with Dr. Nickey Barn for ongoing surveillance and follow-up.   She was advised to call immediately if she has any concerning symptoms in the interval. The patient voices understanding of current disease status and treatment options and is in agreement with the current care plan.  All questions were answered. The patient knows to call the clinic with any problems, questions or concerns. We can certainly see the patient much sooner if necessary.  The total time spent in the appointment was 55 minutes including review of chart and various tests results, discussions about plan of care and coordination of care plan .   Disclaimer: This note was dictated with voice recognition software. Similar sounding words can inadvertently be transcribed and may not be corrected upon review.

## 2023-06-18 ENCOUNTER — Encounter: Payer: Self-pay | Admitting: *Deleted

## 2023-06-18 ENCOUNTER — Telehealth: Payer: Self-pay | Admitting: Internal Medicine

## 2023-06-18 ENCOUNTER — Encounter: Payer: Self-pay | Admitting: Radiation Therapy

## 2023-06-18 NOTE — Progress Notes (Signed)
 Pt called me to ask about her appt on 6/16. Pt states that she won't be able to make that appt as she will still be at the beach. I took note of the pt's concern and let her know that I will ask our schedulers to move the appt to later in the week. I also asked the pt if she was interested in getting a port. Pt confirmed that she would like to get a port placed. I let her know I would try to get her port and infusion in the same week.  I reached out to [facility], IR scheduler at [facility], to schedule the pt's port. Times were offered on 6/17 and 6/18. 6/18 at 11:30 selected. I requested scheduler move the pts infusion appt to 6/19. Lab appt scheduled for 8am, appt with C.Heilingoetter, Onc PA at 8:30am, and infusion scheduled for 10am.  I called the pt back to give her the new schedule. Pt confirms she would like a port appt on 6/18 instead of 6/17, hwoever is refusing the infusion appt on 6/19 as 8am is too early for her to get to the clinic. I let the pt know I will keep her port appt on 6/18 but will need to speak to Dr. Marguerita Shih about pushing her first infusion appt to the week following. I offered the pt the number to schedule her follow up brain MRI at [facility] as needed for Rad Onc. Pt declined as she wants to wait until after her port and infusion appts have been made and doesn't want to become "overwhelmed" with all the appts. Elton Ham, RN notified via [facility] that pt is declining to schedule her brain MRI.  Pt continued to voice concerns over the imfinzi infusion. I offered to mail educational materials about Imfinzi and an informational booklet from [place] about immunotherapy. Confirmed pt's address.

## 2023-06-18 NOTE — Telephone Encounter (Signed)
 Left a voicemail with the future appointment details.

## 2023-06-18 NOTE — Progress Notes (Signed)
 Encounter opened to view notes from patient's chemo education.

## 2023-06-18 NOTE — Progress Notes (Signed)
 Pt states that she does not want to reschedule her missed brain MRI until after she has had her port placed and the first infusion behind her. She feels doing this now will be too overwhelming.   She has a beach trip planned and will not be back in town until after 6/16. Boni Busman RN, thoracic navigator, will help her with the set up of the port placement after she returns.   Axel Bohr R.T(R)(T) Radiation Special Procedures Lead

## 2023-06-18 NOTE — Progress Notes (Signed)
 I reached out to the pt at the mobile and home numbers listed. No answer at either numbers and was unable to leave a VM at either number. Will try again at a later time.

## 2023-06-25 ENCOUNTER — Encounter: Payer: Self-pay | Admitting: Internal Medicine

## 2023-06-25 NOTE — Progress Notes (Signed)
 I reached out to the pt to see if she had received the education materials I had mailed to her. Pt states she had not yet received them, but said pt left for the beach this past Mon/Tues so she won't be receiving her mail until next week when she returns. I reviewed her upcoming appts and pt requested that her infusion appt on 6/19 be pushed to the following week because getting to an 8am appt is difficult to make d/t [place] [employer] and there are often several delivery trucks that are on her street at the time she'd need to leave and they're difficult to get by, so she'd prefer a time in the later in the morning.  She'd also like to give her port that will be placed on 6/18 time to heal. I verbalized understanding and sent a [place] to scheduler to have the pt's appts be moved to 6/23. I let the pt know that I will call once she has returned from the beach next week to let her know her new appt dates and times. I inquired about setting up her SRS MRI at [facility] and pt states she would like to wait until after she has seen her neurologist Dr. Gwendlyn Lemmings. I verbalized understanding of the pt's requests and let her know I will call her next week once everything has been booked.

## 2023-06-28 ENCOUNTER — Encounter: Payer: Self-pay | Admitting: Internal Medicine

## 2023-06-29 ENCOUNTER — Encounter: Payer: Self-pay | Admitting: Internal Medicine

## 2023-07-06 ENCOUNTER — Other Ambulatory Visit: Payer: Self-pay | Admitting: Physician Assistant

## 2023-07-06 ENCOUNTER — Encounter: Payer: Self-pay | Admitting: Internal Medicine

## 2023-07-07 ENCOUNTER — Other Ambulatory Visit: Payer: Self-pay

## 2023-07-08 DIAGNOSIS — S22080A Wedge compression fracture of T11-T12 vertebra, initial encounter for closed fracture: Secondary | ICD-10-CM | POA: Diagnosis not present

## 2023-07-12 ENCOUNTER — Encounter: Payer: Self-pay | Admitting: Radiation Therapy

## 2023-07-12 ENCOUNTER — Ambulatory Visit: Admitting: Physician Assistant

## 2023-07-12 ENCOUNTER — Other Ambulatory Visit

## 2023-07-12 ENCOUNTER — Other Ambulatory Visit: Payer: Self-pay

## 2023-07-12 ENCOUNTER — Ambulatory Visit

## 2023-07-12 NOTE — Progress Notes (Signed)
 Jasmine Long was recently seen by Dr. Adonis Alamin for follow-up of the thoracic spine compression fracture. She is still experiencing pain that makes it difficult to lay flat for procedures like MRI scans. Plans are for her to have kyphoplasty in the near future. Jasmine Long would like to delay her follow-up brain scan until after this has been completed and she has recovered. The goal is that this will improve her pain and make the scan more tolerable.  I have informed Ashlyn Bruning PA-C, of this and will keep an eye out for the procedure to be completed.   Axel Bohr R.T.(R)(T) Radiation Special Procedures Lead

## 2023-07-12 NOTE — H&P (Shared)
 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  Referring Provider(s): Dr. Marlene Simas, MD   Supervising Physician: Marland Silvas  Patient Status: [facility] - Out-pt  ***Patient is Full Code  History of Present Illness: Jasmine Long is a 73 y.o. female  with PMHx notable for small cell lung cancer, HTN, heart murmur, DMT2, thyroid  disease, anxiety, and depression.  Per Dr. 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 Post stereotactic radiosurgery to the brain metastasis and systemic chemotherapy with carboplatin  and etoposide , she showed a good response after four cycles. Discussed initiating durvalumab (Imfinzi) immunotherapy to prolong life and improve disease control. Explained that immunotherapy enhances T cell activity against cancer cells and lacks chemotherapy side effects like alopecia. Potential side effects include organs inflammation. She agreed to start immunotherapy post-trip. Discussed port placement for treatment administration, noting rare risks such as infection. Provided port information in the after-visit summary. Discussed life expectancy, noting average survival for extensive stage small cell lung cancer is approximately 13 months, with potential extension through immunotherapy, and some individuals achieving significantly longer survival. - Initiate durvalumab (Imfinzi) immunotherapy after June 10th. - Include port information in the after-visit summary. - Schedule immunotherapy at the local facility post-June 10th. - Conduct scans every three months to monitor disease progression.   Interventional Radiology was requested for Port-A-Cath placement. Patient is scheduled for same in IR today.    ***Patient is alert and laying in bed, calm.  Patient is currently without any significant complaints.  Patient denies any fevers, headache, chest pain, SOB, cough, abdominal pain, nausea, vomiting or bleeding.     Past Medical History:  Diagnosis Date   Allergy    Anxiety    Depression    Diabetes mellitus without complication (HCC)    Heart murmur    Hypertension    Thyroid  disease    Ulcer     Past Surgical History:  Procedure Laterality Date   BREAST SURGERY     CESAREAN SECTION     CHOLECYSTECTOMY     COLONOSCOPY WITH PROPOFOL  N/A 10/30/2022   Procedure: COLONOSCOPY WITH PROPOFOL ;  Surgeon: Alvis Jourdain, MD;  Location: [facility];  Service: Gastroenterology;  Laterality: N/A;   ENDOSCOPIC MUCOSAL RESECTION  10/30/2022   Procedure: ENDOSCOPIC MUCOSAL RESECTION;  Surgeon: Alvis Jourdain, MD;  Location: [facility];  Service: Gastroenterology;;   HEMOSTASIS CLIP PLACEMENT  10/30/2022   Procedure: HEMOSTASIS CLIP PLACEMENT;  Surgeon: Alvis Jourdain, MD;  Location: [facility];  Service: Gastroenterology;;   NASAL SEPTUM SURGERY     POLYPECTOMY  10/30/2022   Procedure: POLYPECTOMY;  Surgeon: Alvis Jourdain, MD;  Location: [facility];  Service: Gastroenterology;;   [place] LIFTING INJECTION  10/30/2022   Procedure: SUBMUCOSAL LIFTING INJECTION;  Surgeon: Alvis Jourdain, MD;  Location: [facility];  Service: Gastroenterology;;    Allergies: Patient has no known allergies.  Medications: Prior to Admission medications   Medication Sig Start Date End Date Taking? Authorizing Provider  ALPRAZolam  (XANAX ) 0.5 MG tablet Take 1 tablet (0.5 mg total) by mouth 2 (two) times daily as needed for anxiety. 02/09/23   Sagardia, Miguel Jose, MD  atorvastatin  (LIPITOR) 20 MG tablet Take 1 tablet by mouth once daily 08/18/22   Sagardia, Miguel Jose, MD  buPROPion  (WELLBUTRIN  XL) 300 MG  24 hr tablet Take 1 tablet by mouth once daily 08/18/22   Sagardia, Miguel Jose, MD  busPIRone  (BUSPAR )  7.5 MG tablet Take 1 tablet (7.5 mg total) by mouth 2 (two) times daily. 07/16/22   [name], MD  escitalopram  (LEXAPRO ) 10 MG tablet Take 1 tablet (10 mg total) by mouth daily. 12/25/21 12/20/22  [name], MD  gabapentin (NEURONTIN) 300 MG capsule Take 300 mg by mouth as needed (numbness). 08/14/22   [provider]  levothyroxine  (SYNTHROID ) 125 MCG tablet TAKE 1 TABLET BY MOUTH ONCE DAILY BEFORE BREAKFAST 04/14/22   [name], MD  lidocaine  (LIDODERM ) 5 % Place 1 patch onto the skin daily. Remove & Discard patch within 12 hours or as directed by MD 01/12/23   Merdis Stalling, MD  lidocaine -prilocaine  (EMLA ) cream Apply 1 Application topically as needed. 02/04/23   Heilingoetter, Cassandra L, PA-C  metFORMIN  (GLUCOPHAGE ) 1000 MG tablet TAKE 1 TABLET BY MOUTH TWICE DAILY WITH A MEAL 08/18/22   Sagardia, Miguel Jose, MD  metoprolol  succinate (TOPROL -XL) 25 MG 24 hr tablet Take 1 tablet by mouth once daily 05/27/22   Sagardia, Miguel Jose, MD  nicotine  (NICODERM CQ ) 21 mg/24hr patch Place 1 patch (21 mg total) onto the skin daily. 11/26/22   [name], MD  ondansetron  (ZOFRAN ) 8 MG tablet Take 1 tablet (8 mg total) by mouth every 8 (eight) hours as needed for nausea or vomiting. 01/15/23   Bruning, Ashlyn, PA-C  pantoprazole  (PROTONIX ) 40 MG tablet Take 1 tablet twice a day for 2 weeks and then daily after that 08/08/22   Sagardia, Miguel Jose, MD  valsartan -hydrochlorothiazide  (DIOVAN -HCT) 160-12.5 MG tablet Take 1 tablet by mouth once daily 05/27/22   Sagardia, Miguel Jose, MD     Family History  Problem Relation Age of Onset   Lung cancer Mother 30   Dementia Father    Atrial fibrillation Father    Cancer Paternal Aunt        breast cancer   Colon cancer Paternal Aunt    Colon cancer Cousin     Social History   Socioeconomic History   Marital status: Divorced    Spouse name: Not on file   Number of children: 2   Years of education: Not  on file   Highest education level: Bachelor's degree (e.g., BA, AB, BS)  Occupational History   Occupation: Unemployed  Tobacco Use   Smoking status: Every Day    Current packs/day: 0.00    Average packs/day: 1.5 packs/day for 40.0 years (60.0 ttl pk-yrs)    Types: Cigarettes    Start date: 01/20/1972    Last attempt to quit: 01/20/2012    Years since quitting: 11.4   Smokeless tobacco: Never   Tobacco comments:    Pt doing E-cigarette as of 01/20/12  Vaping Use   Vaping status: Former  Substance and Sexual Activity   Alcohol use: No   Drug use: No   Sexual activity: Never  Other Topics Concern   Not on file  Social History Narrative   Patient on worker's compensation for an injury sustained on the job 5 years ago.      Right handed and Left Handed       Lives in a two story home       One son deceased.    Social Drivers of Health   Financial Resource Strain: Low Risk  (01/09/2023)   Overall Financial Resource Strain (CARDIA)    Difficulty  of Paying Living Expenses: Not hard at all  Food Insecurity: No Food Insecurity (01/12/2023)   Hunger Vital Sign    Worried About Running Out of Food in the Last Year: Never true    Ran Out of Food in the Last Year: Never true  Transportation Needs: No Transportation Needs (01/12/2023)   PRAPARE - Administrator, Civil Service (Medical): No    Lack of Transportation (Non-Medical): No  Physical Activity: Inactive (01/09/2023)   Exercise Vital Sign    Days of Exercise per Week: 0 days    Minutes of Exercise per Session: 0 min  Stress: Stress Concern Present (01/09/2023)   [employer] of Occupational Health - Occupational Stress Questionnaire    Feeling of Stress : Very much  Social Connections: Socially Isolated (01/09/2023)   Social Connection and Isolation Panel    Frequency of Communication with Friends and Family: Twice a week    Frequency of Social Gatherings with Friends and Family: Never    Attends  Religious Services: Never    Database administrator or Organizations: No    Attends Engineer, structural: Never    Marital Status: Divorced     Review of Systems: A 12 point ROS discussed and pertinent positives are indicated in the HPI above.  All other systems are negative.  Vital Signs: There were no vitals taken for this visit.  Advance Care Plan: The advanced care place/surrogate decision maker was discussed at the time of visit and the patient did not wish to discuss or was not able to name a surrogate decision maker or provide an advance care plan.  Physical Exam  Imaging: No results found.  Labs:  CBC: Recent Labs    02/04/23 0844 02/23/23 1150 03/23/23 1236 04/26/23 0820  WBC 2.4* 4.8 5.1 4.7  HGB 11.8* 10.7* 10.4* 10.4*  HCT 35.6* 32.5* 31.6* 32.0*  PLT 178 475* 465* 266    COAGS: Recent Labs    12/31/22 1144  INR 1.1    BMP: Recent Labs    02/04/23 0844 02/23/23 1150 03/23/23 1236 04/26/23 0820  NA 137 138 138 139  K 4.3 4.2 4.5 4.1  CL 105 105 105 109  CO2 25 27 26 23   GLUCOSE 95 82 92 99  BUN 18 14 21 23   CALCIUM  9.6 9.2 9.6 9.5  CREATININE 0.84 0.88 0.88 1.08*  GFRNONAA >60 >60 >60 55*    LIVER FUNCTION TESTS: Recent Labs    02/04/23 0844 02/23/23 1150 03/23/23 1236 04/26/23 0820  BILITOT 0.5 0.4 0.4 0.4  AST 8* 12* 10* 11*  ALT 5 7 9 8   ALKPHOS 95 85 88 94  PROT 7.0 6.8 7.2 6.9  ALBUMIN 4.1 3.7 3.9 4.1    TUMOR MARKERS: Recent Labs    04/26/23 0820  CEA 3.59    Assessment and Plan: Per Dr. 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 Post stereotactic radiosurgery to the brain metastasis and systemic chemotherapy with carboplatin  and etoposide , she showed a good response after four cycles. Discussed initiating durvalumab (Imfinzi) immunotherapy to  prolong life and improve disease control. Explained that immunotherapy enhances T cell activity against cancer cells and lacks chemotherapy side effects like alopecia. Potential side effects include organs inflammation. She agreed to start immunotherapy post-trip. Discussed port placement for treatment administration, noting rare risks such as  infection. Provided port information in the after-visit summary. Discussed life expectancy, noting average survival for extensive stage small cell lung cancer is approximately 13 months, with potential extension through immunotherapy, and some individuals achieving significantly longer survival. - Initiate durvalumab (Imfinzi) immunotherapy after June 10th. - Include port information in the after-visit summary. - Schedule immunotherapy at the local facility post-June 10th. - Conduct scans every three months to monitor disease progression.  Patient presents for scheduled Port-A-Cath placement in IR today.  Patient has been NPO since midnight.  All labs and medications are within acceptable parameters.  No pertinent allergies.   Risks and benefits of image guided port-a-catheter placement was discussed with the patient including, but not limited to bleeding, infection, pneumothorax, or fibrin sheath development and need for additional procedures.  All of the patient's questions were answered, patient is agreeable to proceed. Consent signed and in chart.      Thank you for allowing our service to participate in TONYETTA BERKO 's care.  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

## 2023-07-13 ENCOUNTER — Encounter: Payer: Self-pay | Admitting: Internal Medicine

## 2023-07-13 NOTE — Progress Notes (Signed)
 I had a message from [facility] IR Scheduler, [facility], that a tube in [facility] IR dept was down and can't accommodate the pt's port placement tomorrow. Tiffany states she offered the pt a spot at [facility], however the pt had a traumatic experience there and refuses to have the procedure done there. Pt called me this morning and left a message letting me know that because she can't get her port done, she will have to delay the start of her imfinzi as well.

## 2023-07-13 NOTE — Progress Notes (Addendum)
 I spoke to [place] the morning of 6/16. Pt stated she had some information for me regarding her appointment with her neurosurgeon, Dr. Malcolm. Per the pt, Dr. Malcolm is recommending a kyphoplasty to the damaged vertebrae that resulted from a fall in Dec 2024. I conferred with C.Heilingoetter, Onc PA in real time via [place] to ask if the immunotherapy the pt is supposed to start on 6/23 would interfere with a surgery, and was assured that it wouldn't, so I relayed that information to [place]. I did ask [place] to call me when the surgery has been scheduled. I asked the pt about scheduling her for her SRS MRI, but pt is requesting that that be delayed until after her surgery because she can't lay flat now and is hoping she can after the kyphoplasty.  [place] asked if she was going to lose her hair with this treatment. I asked her if she had read through the packet of information I sent to her in May and she states she hasn't. I let her know she will likely not lose her hair, but also to read through the information I provided to her.  She asked about her port and stated she had a right for Thursday when she is having it done. I corrected her and told her that it was on 6/18, not 6/19. I referenced a note from May that I had told her the port placement is scheduled for 6/18. Pt states she wrote it down wrong and may have to find alternative transportation. I reminded her she is not supposed to have any food after midnight and to have sips of plain water with any medication she needs to take in the morning. Pt states she is not going to take any of her medication, but will bring a xanax  with her to take if necessary.

## 2023-07-14 ENCOUNTER — Encounter: Payer: Self-pay | Admitting: Emergency Medicine

## 2023-07-14 ENCOUNTER — Ambulatory Visit (HOSPITAL_COMMUNITY)

## 2023-07-14 ENCOUNTER — Other Ambulatory Visit: Payer: Self-pay | Admitting: Neurosurgery

## 2023-07-14 ENCOUNTER — Inpatient Hospital Stay (HOSPITAL_COMMUNITY): Admission: RE | Admit: 2023-07-14 | Source: Ambulatory Visit

## 2023-07-15 ENCOUNTER — Ambulatory Visit: Admitting: Physician Assistant

## 2023-07-15 ENCOUNTER — Encounter (HOSPITAL_COMMUNITY): Payer: Self-pay | Admitting: Neurosurgery

## 2023-07-15 ENCOUNTER — Other Ambulatory Visit: Payer: Self-pay

## 2023-07-15 ENCOUNTER — Ambulatory Visit

## 2023-07-15 ENCOUNTER — Other Ambulatory Visit

## 2023-07-15 NOTE — Progress Notes (Signed)
 PCP - Dr Maryagnes Small Cardiologist - none Oncology - Dr Marlene Simas  CT Chest x-ray - 05/11/23 EKG - DOS Stress Test - 2010 ECHO - 05/25/17 Cardiac Cath - n/a  ICD Pacemaker/Loop - n/a  Sleep Study -  n/a CPAP - none  Diabetes Type 2 Do not take Metformin  on the morning of surgery.  Does not check blood sugar.  Aspirin  & Blood Thinner Instructions:  n/a  NPO  Anesthesia review: Yes  STOP now taking any Aspirin  (unless otherwise instructed by your surgeon), Aleve , Naproxen , Ibuprofen , Motrin , Advil , Goody's, BC's, all herbal medications, fish oil, and all vitamins.   Coronavirus Screening Do you have any of the following symptoms:  Cough yes/no: No Fever (>100.17F)  yes/no: No Runny nose yes/no: No Sore throat yes/no: No Difficulty breathing/shortness of breath  yes/no: No  Have you traveled in the last 14 days and where? yes/no: No  Patient verbalized understanding of instructions that were given via appointment

## 2023-07-16 ENCOUNTER — Encounter: Payer: Self-pay | Admitting: Internal Medicine

## 2023-07-16 NOTE — Anesthesia Preprocedure Evaluation (Signed)
 Anesthesia Evaluation    Airway        Dental   Pulmonary Current Smoker and Patient abstained from smoking.          Cardiovascular hypertension,      Neuro/Psych    GI/Hepatic   Endo/Other  diabetes    Renal/GU      Musculoskeletal   Abdominal   Peds  Hematology   Anesthesia Other Findings   Reproductive/Obstetrics                              Anesthesia Physical Anesthesia Plan  ASA:   Anesthesia Plan:    Post-op Pain Management:    Induction:   PONV Risk Score and Plan:   Airway Management Planned:   Additional Equipment:   Intra-op Plan:   Post-operative Plan:   Informed Consent:   Plan Discussed with:   Anesthesia Plan Comments: (PAT note by Rudy Costain, PA-C: 73 year old female with pertinent history including current everyday smoker, hypothyroidism, HTN, non-insulin-dependent DM2 (A1c 5.8 on 09/11/2022), advanced lung cancer.  Follows with oncology for history of extensive small cell lung cancer with large left upper lobe mass in addition to left mediastinal and left supraclavicular lymphadenopathy and solitary brain metastasis in the right parietal area diagnosed in December 2024.  She underwent concurrent chemoradiation and is currently on consolidation treatment with immunotherapy with Imfinzi every 4 weeks.  Prior echocardiogram 05/25/2017 showed EF 55 to 60%, moderate LVH, grade 1 DD, very mild aortic stenosis with mean gradient 10 mmHg.  Patient states that prior to back injury she was able to go up 2 flights of stairs without cardiopulmonary complaint.  Echocardiogram reviewed with anesthesiologist Dr. Glenetta Lane.  AdviseD given reasonable functional status, okay to proceed as planned.   She will need day of surgery labs and evaluation.  TTE 05/25/2017: - Left ventricle: The cavity size was normal. Wall thickness was    increased in a pattern of moderate LVH.  Systolic function was    normal. The estimated ejection fraction was in the range of 55%    to 60%. Wall motion was normal; there were no regional wall    motion abnormalities. Doppler parameters are consistent with    abnormal left ventricular relaxation (grade 1 diastolic    dysfunction). Doppler parameters are consistent with high    ventricular filling pressure.  - Aortic valve: There was very mild stenosis. There was mild    regurgitation.  - Mitral valve: Calcified annulus.  - Left atrium: The atrium was mildly dilated.   Impressions:   - Normal LV systolic function; mild diastolic dysfunction; moderate    LVH; calcified aortic valve with very mild AS and mild AI; mild    LAE.   )         Anesthesia Quick Evaluation

## 2023-07-16 NOTE — Progress Notes (Signed)
 Anesthesia Chart Review: Same day workup  73 year old female with pertinent history including current everyday smoker, hypothyroidism, HTN, non-insulin-dependent DM2 (A1c 5.8 on 09/11/2022), advanced lung cancer.  Follows with oncology for history of extensive small cell lung cancer with large left upper lobe mass in addition to left mediastinal and left supraclavicular lymphadenopathy and solitary brain metastasis in the right parietal area diagnosed in December 2024.  She underwent concurrent chemoradiation and is currently on consolidation treatment with immunotherapy with Imfinzi every 4 weeks.  Prior echocardiogram 05/25/2017 showed EF 55 to 60%, moderate LVH, grade 1 DD, very mild aortic stenosis with mean gradient 10 mmHg.  Patient states that prior to back injury she was able to go up 2 flights of stairs without cardiopulmonary complaint.  Echocardiogram reviewed with anesthesiologist Dr. Glenetta Lane.  AdviseD given reasonable functional status, okay to proceed as planned.   She will need day of surgery labs and evaluation.  TTE 05/25/2017: - Left ventricle: The cavity size was normal. Wall thickness was    increased in a pattern of moderate LVH. Systolic function was    normal. The estimated ejection fraction was in the range of 55%    to 60%. Wall motion was normal; there were no regional wall    motion abnormalities. Doppler parameters are consistent with    abnormal left ventricular relaxation (grade 1 diastolic    dysfunction). Doppler parameters are consistent with high    ventricular filling pressure.  - Aortic valve: There was very mild stenosis. There was mild    regurgitation.  - Mitral valve: Calcified annulus.  - Left atrium: The atrium was mildly dilated.   Impressions:   - Normal LV systolic function; mild diastolic dysfunction; moderate    LVH; calcified aortic valve with very mild AS and mild AI; mild    LAE.     Edilia Gordon [facility] Short Stay  Center/Anesthesiology Phone [phone] 07/16/2023 12:20 PM

## 2023-07-16 NOTE — Progress Notes (Signed)
 The pt called me to let me know that she will have to cancel the follow up appt she has with C.Heilingoetter, Onc PA on 6/23 as she has been scheduled for her kyphoplasty on that day. Pt is apologetic, however is happy that the pain in her back will be addressed. I asked the pt to call after her procedure has been completed and we can reschedule her with C.Heilingoetter, Onc PA or Dr. Marguerita Shih. Pt verbalized understanding.

## 2023-07-18 ENCOUNTER — Other Ambulatory Visit: Payer: Self-pay

## 2023-07-19 ENCOUNTER — Encounter (HOSPITAL_COMMUNITY): Payer: Self-pay | Admitting: Neurosurgery

## 2023-07-19 ENCOUNTER — Other Ambulatory Visit

## 2023-07-19 ENCOUNTER — Ambulatory Visit: Admitting: Physician Assistant

## 2023-07-19 ENCOUNTER — Ambulatory Visit

## 2023-07-20 ENCOUNTER — Ambulatory Visit (HOSPITAL_COMMUNITY): Payer: Self-pay | Admitting: Physician Assistant

## 2023-07-20 ENCOUNTER — Encounter (HOSPITAL_COMMUNITY): Payer: Self-pay | Admitting: Neurosurgery

## 2023-07-20 ENCOUNTER — Other Ambulatory Visit: Payer: Self-pay

## 2023-07-20 ENCOUNTER — Ambulatory Visit (HOSPITAL_COMMUNITY)

## 2023-07-20 ENCOUNTER — Ambulatory Visit (HOSPITAL_COMMUNITY)
Admission: RE | Admit: 2023-07-20 | Discharge: 2023-07-20 | Disposition: A | Discharge status: Home / Self Care | Source: Ambulatory Visit | Attending: Neurosurgery | Admitting: Neurosurgery

## 2023-07-20 ENCOUNTER — Encounter (HOSPITAL_COMMUNITY)
Admission: RE | Disposition: A | Discharge status: Home / Self Care | Payer: Self-pay | Source: Ambulatory Visit | Attending: Neurosurgery

## 2023-07-20 DIAGNOSIS — C7931 Secondary malignant neoplasm of brain: Secondary | ICD-10-CM | POA: Insufficient documentation

## 2023-07-20 DIAGNOSIS — E119 Type 2 diabetes mellitus without complications: Secondary | ICD-10-CM | POA: Insufficient documentation

## 2023-07-20 DIAGNOSIS — I352 Nonrheumatic aortic (valve) stenosis with insufficiency: Secondary | ICD-10-CM | POA: Diagnosis not present

## 2023-07-20 DIAGNOSIS — S22080A Wedge compression fracture of T11-T12 vertebra, initial encounter for closed fracture: Secondary | ICD-10-CM | POA: Diagnosis not present

## 2023-07-20 DIAGNOSIS — M8008XA Age-related osteoporosis with current pathological fracture, vertebra(e), initial encounter for fracture: Secondary | ICD-10-CM | POA: Insufficient documentation

## 2023-07-20 DIAGNOSIS — F418 Other specified anxiety disorders: Secondary | ICD-10-CM | POA: Diagnosis not present

## 2023-07-20 DIAGNOSIS — I10 Essential (primary) hypertension: Secondary | ICD-10-CM | POA: Insufficient documentation

## 2023-07-20 DIAGNOSIS — F419 Anxiety disorder, unspecified: Secondary | ICD-10-CM | POA: Insufficient documentation

## 2023-07-20 DIAGNOSIS — C3412 Malignant neoplasm of upper lobe, left bronchus or lung: Secondary | ICD-10-CM | POA: Insufficient documentation

## 2023-07-20 DIAGNOSIS — Z7984 Long term (current) use of oral hypoglycemic drugs: Secondary | ICD-10-CM | POA: Insufficient documentation

## 2023-07-20 DIAGNOSIS — Z923 Personal history of irradiation: Secondary | ICD-10-CM | POA: Diagnosis not present

## 2023-07-20 DIAGNOSIS — M8088XA Other osteoporosis with current pathological fracture, vertebra(e), initial encounter for fracture: Secondary | ICD-10-CM

## 2023-07-20 DIAGNOSIS — Z9221 Personal history of antineoplastic chemotherapy: Secondary | ICD-10-CM | POA: Insufficient documentation

## 2023-07-20 DIAGNOSIS — E039 Hypothyroidism, unspecified: Secondary | ICD-10-CM | POA: Diagnosis not present

## 2023-07-20 DIAGNOSIS — F17210 Nicotine dependence, cigarettes, uncomplicated: Secondary | ICD-10-CM | POA: Insufficient documentation

## 2023-07-20 DIAGNOSIS — F32A Depression, unspecified: Secondary | ICD-10-CM | POA: Insufficient documentation

## 2023-07-20 DIAGNOSIS — M4854XA Collapsed vertebra, not elsewhere classified, thoracic region, initial encounter for fracture: Secondary | ICD-10-CM | POA: Diagnosis not present

## 2023-07-20 HISTORY — DX: Malignant (primary) neoplasm, unspecified: C80.1

## 2023-07-20 HISTORY — DX: Pneumonia, unspecified organism: J18.9

## 2023-07-20 HISTORY — DX: Other complications of anesthesia, initial encounter: T88.59XA

## 2023-07-20 HISTORY — DX: Hypothyroidism, unspecified: E03.9

## 2023-07-20 HISTORY — DX: Myoneural disorder, unspecified: G70.9

## 2023-07-20 HISTORY — PX: KYPHOPLASTY: SHX5884

## 2023-07-20 LAB — CBC
HCT: 35.9 % — ABNORMAL LOW (ref 36.0–46.0)
Hemoglobin: 11.6 g/dL — ABNORMAL LOW (ref 12.0–15.0)
MCH: 30.7 pg (ref 26.0–34.0)
MCHC: 32.3 g/dL (ref 30.0–36.0)
MCV: 95 fL (ref 80.0–100.0)
Platelets: 257 10*3/uL (ref 150–400)
RBC: 3.78 MIL/uL — ABNORMAL LOW (ref 3.87–5.11)
RDW: 14 % (ref 11.5–15.5)
WBC: 4.8 10*3/uL (ref 4.0–10.5)
nRBC: 0 % (ref 0.0–0.2)

## 2023-07-20 LAB — BASIC METABOLIC PANEL WITH GFR
Anion gap: 12 (ref 5–15)
BUN: 23 mg/dL (ref 8–23)
CO2: 22 mmol/L (ref 22–32)
Calcium: 9.5 mg/dL (ref 8.9–10.3)
Chloride: 107 mmol/L (ref 98–111)
Creatinine, Ser: 0.93 mg/dL (ref 0.44–1.00)
GFR, Estimated: >60 mL/min (ref >60)
Glucose, Bld: 87 mg/dL (ref 70–99)
Potassium: 4.2 mmol/L (ref 3.5–5.1)
Sodium: 141 mmol/L (ref 135–145)

## 2023-07-20 LAB — GLUCOSE, CAPILLARY
Glucose-Capillary: 82 mg/dL (ref 70–99)
Glucose-Capillary: 94 mg/dL (ref 70–99)

## 2023-07-20 SURGERY — KYPHOPLASTY
Anesthesia: Monitor Anesthesia Care

## 2023-07-20 MED ORDER — ACETAMINOPHEN 10 MG/ML IV SOLN: 1000.0000 mg | Freq: Once | INTRAVENOUS | Status: DC | PRN

## 2023-07-20 MED ORDER — PROPOFOL 500 MG/50ML IV EMUL
INTRAVENOUS | Status: DC | PRN
  Administered 2023-07-20: 100 ug/kg/min via INTRAVENOUS

## 2023-07-20 MED ORDER — FENTANYL CITRATE (PF) 100 MCG/2ML IJ SOLN
INTRAMUSCULAR | Status: AC
  Filled 2023-07-20: qty 2

## 2023-07-20 MED ORDER — BUPIVACAINE HCL (PF) 0.25 % IJ SOLN
INTRAMUSCULAR | Status: DC | PRN
  Administered 2023-07-20: 10 mL

## 2023-07-20 MED ORDER — LACTATED RINGERS IV SOLN: INTRAVENOUS | Status: DC

## 2023-07-20 MED ORDER — CHLORHEXIDINE GLUCONATE CLOTH 2 % EX PADS: 6.0000 | MEDICATED_PAD | Freq: Once | CUTANEOUS | Status: DC

## 2023-07-20 MED ORDER — KETAMINE HCL 10 MG/ML IJ SOLN
INTRAMUSCULAR | Status: DC | PRN
  Administered 2023-07-20: 10 mg via INTRAVENOUS

## 2023-07-20 MED ORDER — CEFAZOLIN SODIUM-DEXTROSE 2-4 GM/100ML-% IV SOLN
2.0000 g | INTRAVENOUS | Status: AC
  Administered 2023-07-20: 2 g via INTRAVENOUS
  Filled 2023-07-20: qty 100

## 2023-07-20 MED ORDER — PROPOFOL 10 MG/ML IV BOLUS
INTRAVENOUS | Status: DC | PRN
  Administered 2023-07-20: 20 mg via INTRAVENOUS
  Administered 2023-07-20: 30 mg via INTRAVENOUS
  Administered 2023-07-20: 20 mg via INTRAVENOUS

## 2023-07-20 MED ORDER — IOPAMIDOL (ISOVUE-300) INJECTION 61%
INTRAVENOUS | Status: DC | PRN
Start: 2023-07-20 — End: 2023-07-20
  Administered 2023-07-20: 100 mL

## 2023-07-20 MED ORDER — ORAL CARE MOUTH RINSE: 15.0000 mL | Freq: Once | OROMUCOSAL | Status: AC

## 2023-07-20 MED ORDER — FENTANYL CITRATE (PF) 100 MCG/2ML IJ SOLN
INTRAMUSCULAR | Status: DC | PRN
  Administered 2023-07-20 (×2): 12.5 ug via INTRAVENOUS
  Administered 2023-07-20 (×2): 25 ug via INTRAVENOUS

## 2023-07-20 MED ORDER — BUPIVACAINE HCL (PF) 0.25 % IJ SOLN
INTRAMUSCULAR | Status: AC
Start: 2023-07-20 — End: 2023-07-20
  Filled 2023-07-20: qty 30

## 2023-07-20 MED ORDER — TRAMADOL HCL 50 MG PO TABS: 50.0000 mg | ORAL_TABLET | Freq: Four times a day (QID) | ORAL | 0 refills | Status: DC | PRN

## 2023-07-20 MED ORDER — ONDANSETRON HCL 4 MG/2ML IJ SOLN
INTRAMUSCULAR | Status: DC | PRN
  Administered 2023-07-20: 4 mg via INTRAVENOUS

## 2023-07-20 MED ORDER — CHLORHEXIDINE GLUCONATE 0.12 % MT SOLN
15.0000 mL | Freq: Once | OROMUCOSAL | Status: AC
  Administered 2023-07-20: 15 mL via OROMUCOSAL
  Filled 2023-07-20: qty 15

## 2023-07-20 MED ORDER — 0.9 % SODIUM CHLORIDE (POUR BTL) OPTIME
TOPICAL | Status: DC | PRN
Start: 2023-07-20 — End: 2023-07-20
  Administered 2023-07-20: 1000 mL

## 2023-07-20 MED ORDER — FENTANYL CITRATE (PF) 100 MCG/2ML IJ SOLN: 25.0000 ug | INTRAMUSCULAR | Status: DC | PRN

## 2023-07-20 MED ORDER — KETAMINE HCL 50 MG/5ML IJ SOSY
PREFILLED_SYRINGE | INTRAMUSCULAR | Status: AC
  Filled 2023-07-20: qty 5

## 2023-07-20 SURGICAL SUPPLY — 38 items
BAG COUNTER SPONGE SURGICOUNT (BAG) ×2 IMPLANT
BLADE CLIPPER SURG (BLADE) IMPLANT
BLADE SURG 15 STRL LF DISP TIS (BLADE) ×2 IMPLANT
BNDG ADH 1X3 SHEER STRL LF (GAUZE/BANDAGES/DRESSINGS) ×4 IMPLANT
CEMENT KYPHON C01A KIT/MIXER (Cement) ×2 IMPLANT
DERMABOND ADVANCED .7 DNX12 (GAUZE/BANDAGES/DRESSINGS) IMPLANT
DEVICE BIOPSY BONE KYPHX (INSTRUMENTS) ×2 IMPLANT
DRAPE C-ARM 42X72 X-RAY (DRAPES) ×2 IMPLANT
DRAPE HALF SHEET 40X57 (DRAPES) ×2 IMPLANT
DRAPE INCISE IOBAN 66X45 STRL (DRAPES) ×2 IMPLANT
DRAPE LAPAROTOMY 100X72X124 (DRAPES) ×2 IMPLANT
DRAPE WARM FLUID 44X44 (DRAPES) ×2 IMPLANT
DURAPREP 26ML APPLICATOR (WOUND CARE) ×2 IMPLANT
GAUZE 4X4 16PLY ~~LOC~~+RFID DBL (SPONGE) ×2 IMPLANT
GLOVE BIOGEL PI IND STRL 7.5 (GLOVE) IMPLANT
GLOVE ECLIPSE 9.0 STRL (GLOVE) ×2 IMPLANT
GLOVE EXAM NITRILE XL STR (GLOVE) IMPLANT
GOWN STRL REIN 3XL LVL4 (GOWN DISPOSABLE) IMPLANT
GOWN STRL REUS W/ TWL LRG LVL3 (GOWN DISPOSABLE) ×2 IMPLANT
GOWN STRL REUS W/ TWL XL LVL3 (GOWN DISPOSABLE) IMPLANT
GOWN STRL REUS W/TWL 2XL LVL3 (GOWN DISPOSABLE) IMPLANT
INTRODUCER DEVICE OSTEO LEVEL (INTRODUCER) IMPLANT
KIT BASIN OR (CUSTOM PROCEDURE TRAY) ×2 IMPLANT
KIT TURNOVER KIT B (KITS) ×2 IMPLANT
NDL HYPO 25X1 1.5 SAFETY (NEEDLE) ×2 IMPLANT
NEEDLE HYPO 25X1 1.5 SAFETY (NEEDLE) ×1 IMPLANT
NS IRRIG 1000ML POUR BTL (IV SOLUTION) ×2 IMPLANT
PACK EENT II TURBAN DRAPE (CUSTOM PROCEDURE TRAY) ×2 IMPLANT
PAD ARMBOARD POSITIONER FOAM (MISCELLANEOUS) ×2 IMPLANT
STAPLER SKIN PROX 35W (STAPLE) ×2 IMPLANT
SUT VIC AB 2-0 CP2 18 (SUTURE) IMPLANT
SUT VIC AB 3-0 SH 8-18 (SUTURE) IMPLANT
SUT VIC AB 4-0 P-3 18XBRD (SUTURE) IMPLANT
SYR CONTROL 10ML LL (SYRINGE) ×2 IMPLANT
TOWEL GREEN STERILE (TOWEL DISPOSABLE) ×2 IMPLANT
TOWEL GREEN STERILE FF (TOWEL DISPOSABLE) ×2 IMPLANT
TRAY KYPHOPAK 15/3 ONESTEP 1ST (MISCELLANEOUS) IMPLANT
TRAY KYPHOPAK 20/3 ONESTEP 1ST (MISCELLANEOUS) IMPLANT

## 2023-07-20 NOTE — Transfer of Care (Signed)
 Immediate Anesthesia Transfer of Care Note  Patient: Jasmine Long  Procedure(s) Performed: KYPHOPLASTY THORACIC TWELVE  Patient Location: PACU  Anesthesia Type:MAC  Level of Consciousness: awake  Airway & Oxygen Therapy: Patient Spontanous Breathing  Post-op Assessment: Report given to RN and Post -op Vital signs reviewed and stable  Post vital signs: Reviewed and stable  Last Vitals:  Vitals Value Taken Time  BP 109/84 07/20/23 10:09  Temp 36.3 C 07/20/23 10:09  Pulse 79 07/20/23 10:11  Resp 19 07/20/23 10:11  SpO2 96 % 07/20/23 10:11  Vitals shown include unfiled device data.  Last Pain:  Vitals:   07/20/23 0734  TempSrc:   PainSc: 0-No pain      Patients Stated Pain Goal: 0 (07/20/23 0734)  Complications: No notable events documented.

## 2023-07-20 NOTE — H&P (Signed)
 Jasmine Long is an 73 y.o. female.   Chief Complaint: Back pain HPI: 73 year old female with chronic lumbar pain after a fall approximately a year ago.  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  No evidence of neoplastic disease.  No evidence of any significant lower extremity dysfunction.  Patient has failed conservative management presents now for T12 kyphoplasty.  Past Medical History:  Diagnosis Date   Allergy    Anxiety    Cancer (HCC)    small cell lung cancer   Complication of anesthesia    per patient she was hard to wake up   Depression    Diabetes mellitus without complication (HCC)    type 2, does not check blood sugar   Heart murmur    never has caused any problems   Hypertension    Hypothyroidism    Neuromuscular disorder (HCC)    bilateral lower extremity - numbness   Pneumonia    as a child   Thyroid  disease    Ulcer     Past Surgical History:  Procedure Laterality Date   BREAST SURGERY Right    lumpectomy- benign   CESAREAN SECTION  1985   CHOLECYSTECTOMY  1980   open   COLONOSCOPY WITH PROPOFOL  N/A 10/30/2022   Procedure: COLONOSCOPY WITH PROPOFOL ;  Surgeon: Rollin Dover, MD;  Location: [facility];  Service: Gastroenterology;  Laterality: N/A;   ENDOSCOPIC MUCOSAL RESECTION  10/30/2022   Procedure: ENDOSCOPIC MUCOSAL RESECTION;  Surgeon: Rollin Dover, MD;  Location: [facility];  Service: Gastroenterology;;   HEMOSTASIS CLIP PLACEMENT  10/30/2022   Procedure: HEMOSTASIS CLIP PLACEMENT;  Surgeon: Rollin Dover, MD;  Location: [facility];  Service: Gastroenterology;;   NASAL SEPTUM SURGERY     POLYPECTOMY  10/30/2022   Procedure: POLYPECTOMY;  Surgeon: Rollin Dover, MD;  Location: [facility];  Service: Gastroenterology;;   ROBLEY LIFTING INJECTION  10/30/2022   Procedure: SUBMUCOSAL LIFTING INJECTION;  Surgeon: Rollin Dover, MD;  Location: [facility];  Service: Gastroenterology;;    Family History  Problem  Relation Age of Onset   Lung cancer Mother 16   Dementia Father    Atrial fibrillation Father    Cancer Paternal Aunt        breast cancer   Colon cancer Paternal Aunt    Colon cancer Cousin    Social History:  reports that she has been smoking cigarettes. She started smoking about 51 years ago. She has a 60 pack-year smoking history. She has never used smokeless tobacco. She reports that she does not drink alcohol and does not use drugs.  Allergies:  Allergies  Allergen Reactions   Other Other (See Comments)    Year long allergies    Medications Prior to Admission  Medication Sig Dispense Refill   ALPRAZolam  (XANAX ) 0.5 MG tablet Take 1 tablet (0.5 mg total) by mouth 2 (two) times daily as needed for anxiety. 30 tablet 3   atorvastatin  (LIPITOR) 20 MG tablet Take 1 tablet by mouth once daily 90 tablet 1   buPROPion  (WELLBUTRIN  XL) 300 MG 24 hr tablet Take 1 tablet by mouth once daily 90 tablet 1   busPIRone  (BUSPAR ) 7.5 MG tablet Take 1 tablet (7.5 mg total) by mouth 2 (two) times daily. 60 tablet 1   clonazePAM  (KLONOPIN ) 1 MG tablet Take 1 mg by mouth daily as needed for anxiety (Sleep).     escitalopram  (LEXAPRO ) 10 MG tablet Take 1 tablet (10 mg total) by mouth daily. 90 tablet 3  gabapentin (NEURONTIN) 300 MG capsule Take 300 mg by mouth daily as needed (numbness).     levothyroxine  (SYNTHROID ) 125 MCG tablet TAKE 1 TABLET BY MOUTH ONCE DAILY BEFORE BREAKFAST 90 tablet 0   metFORMIN  (GLUCOPHAGE ) 1000 MG tablet TAKE 1 TABLET BY MOUTH TWICE DAILY WITH A MEAL 180 tablet 1   metoprolol  succinate (TOPROL -XL) 25 MG 24 hr tablet Take 1 tablet by mouth once daily 90 tablet 3   sodium chloride  (OCEAN) 0.65 % SOLN nasal spray Place 1 spray into both nostrils daily as needed (Sinus).     valsartan -hydrochlorothiazide  (DIOVAN -HCT) 160-12.5 MG tablet Take 1 tablet by mouth once daily 90 tablet 3   lidocaine  (LIDODERM ) 5 % Place 1 patch onto the skin daily. Remove & Discard patch within 12  hours or as directed by MD (Patient not taking: Reported on 07/14/2023) 30 patch 0   lidocaine -prilocaine  (EMLA ) cream Apply 1 Application topically as needed. 30 g 2   nicotine  (NICODERM CQ ) 21 mg/24hr patch Place 1 patch (21 mg total) onto the skin daily. (Patient not taking: Reported on 07/14/2023) 28 patch 3   ondansetron  (ZOFRAN ) 8 MG tablet Take 1 tablet (8 mg total) by mouth every 8 (eight) hours as needed for nausea or vomiting. 30 tablet 1   pantoprazole  (PROTONIX ) 40 MG tablet Take 1 tablet twice a day for 2 weeks and then daily after that (Patient not taking: Reported on 07/14/2023) 90 tablet 3    Results for orders placed or performed during the hospital encounter of 07/20/23 (from the past 48 hours)  Basic metabolic panel per protocol     Status: None   Collection Time: 07/20/23  7:05 AM  Result Value Ref Range   Sodium 141 135 - 145 mmol/L   Potassium 4.2 3.5 - 5.1 mmol/L   Chloride 107 98 - 111 mmol/L   CO2 22 22 - 32 mmol/L   Glucose, Bld 87 70 - 99 mg/dL    Comment: Glucose reference range applies only to samples taken after fasting for at least 8 hours.   BUN 23 8 - 23 mg/dL   Creatinine, Ser 9.06 0.44 - 1.00 mg/dL   Calcium  9.5 8.9 - 10.3 mg/dL   GFR, Estimated >39 >39 mL/min    Comment: (NOTE) Calculated using the CKD-EPI Creatinine Equation (2021)    Anion gap 12 5 - 15    Comment: Performed at [facility], [address], [place], [place] [address]  CBC per protocol     Status: Abnormal   Collection Time: 07/20/23  7:05 AM  Result Value Ref Range   WBC 4.8 4.0 - 10.5 K/uL   RBC 3.78 (L) 3.87 - 5.11 MIL/uL   Hemoglobin 11.6 (L) 12.0 - 15.0 g/dL   HCT 64.0 (L) 63.9 - 53.9 %   MCV 95.0 80.0 - 100.0 fL   MCH 30.7 26.0 - 34.0 pg   MCHC 32.3 30.0 - 36.0 g/dL   RDW 85.9 88.4 - 84.4 %   Platelets 257 150 - 400 K/uL   nRBC 0.0 0.0 - 0.2 %    Comment: Performed at [facility], [address], [place], [place] [address]   No results found.  Pertinent  items noted in HPI and remainder of comprehensive ROS otherwise negative.  Blood pressure 133/63, pulse 81, temperature 98.2 F (36.8 C), temperature source Oral, resp. rate 18, height 5' 9 (1.753 m), weight 77.1 kg, SpO2 96%.  Awake and alert.  Oriented and appropriate.  Motor and sensory function intact.  Chest and abdomen benign.  Extremities free from injury or deformity. Assessment/Plan Osteoporotic T12 compression fracture.  Plan T12 kyphoplasty.  Risks and benefits of explained.  Patient wishes to proceed.  Victory A Jamielynn Wigley 07/20/2023, 8:21 AM

## 2023-07-20 NOTE — Brief Op Note (Signed)
 07/20/2023  10:00 AM  PATIENT:  Jenkins MARLA Gentry  73 y.o. female  PRE-OPERATIVE DIAGNOSIS:  Compression fracture of T12 vertebra  POST-OPERATIVE DIAGNOSIS:  * No post-op diagnosis entered *  PROCEDURE:  Procedure(s): KYPHOPLASTY (N/A)  SURGEON:  Surgeons and Role:    DEWAINE Louis Shove, MD - Primary  PHYSICIAN ASSISTANT:   ASSISTANTS:    ANESTHESIA:   local and MAC  EBL:  minimal   BLOOD ADMINISTERED:none  DRAINS: none   LOCAL MEDICATIONS USED:  MARCAINE     SPECIMEN:  No Specimen  DISPOSITION OF SPECIMEN:  N/A  COUNTS:  YES  TOURNIQUET:  * No tourniquets in log *  DICTATION: .Dragon Dictation  PLAN OF CARE: Discharge to home after PACU  PATIENT DISPOSITION:  PACU - hemodynamically stable.   Delay start of Pharmacological VTE agent (>24hrs) due to surgical blood loss or risk of bleeding: yes

## 2023-07-20 NOTE — Progress Notes (Signed)
 Dr. Leopoldo made aware that the patient states she has not taken any medications including Metoprolol  in the past 2-3 weeks. Per the patient she stopped the medications herself because she wanted to see how she reacted to immunotherapy but she had a delay in starting immunotherapy and has not actually started yet. Ok per Dr. Leopoldo. Dr. Leopoldo stated he will be out shortly to talk to the patient. No new orders received at this time.

## 2023-07-20 NOTE — Op Note (Signed)
 Date of procedure: 07/20/2023  Date of dictation: Same  Service: Neurosurgery  Preoperative diagnosis: Osteoporotic compression fracture of T12   Postoperative diagnosis: Same  Procedure Name: T12 kyphoplasty/vertebroplasty  Surgeon:Demarquez Ciolek A.Jarquez Mestre, M.D.  Asst. Surgeon: None  Anesthesia: General  Indication: 73 year old female remotely status post fall with T12 osteoporotic compression fracture which has not healed with conservative management.  Patient still with severe symptoms of pain.  Plan for T12 kyphoplasty.  Operative note: After induction of IV sedation patient positioned prone onto bolsters and appropriately padded.  Patient's thoracic region prepped and draped sterilely.  Localizing fluoroscopy was used in both the AP and lateral plane.  Planned entry site to her left T12 pedicle was identified.  Local Marcaine infiltrated into the skin and soft tissue.  A stab incision is made overlying the entry site.  A Jamshidi needle introducer was then passed through the incision docking with the lateral wall of the left T12 pedicle.  The Jamshidi was then directed through the pedicle into the vertebral body under AP and lateral fluoroscopic guidance.  Once good position and been achieved.  The central stylette was removed and the bone was further cannulated with a drill.  Kyphoplasty balloon was inserted and inflated.  There was minimal reduction of her fracture.  Methylmethacrylate was mixed and then methylmethacrylate was carefully instilled into the T12 vertebral body with good filling of her fracture site and good intercalation of the entire vertebral body of T12 left to right.  There was some minimal methylmethacrylate extravasation within her disc space at T11-T12.  There was no evidence of any extravasation into any retroperitoneal space or more importantly her spinal canal.  Approximately 6 cc of methylmethacrylate was instilled.  This was allowed to harden.  The Jamshidi introducer was  removed.  Final images reveal good position of the methylmethacrylate at the proper operative level.  Wound was then closed with a 4-0 Vicryl suture.  Dermabond was placed over the wound.  No apparent complications.  Patient tolerated the procedure well and she returns to the recovery room postop.

## 2023-07-21 ENCOUNTER — Encounter (HOSPITAL_COMMUNITY): Payer: Self-pay | Admitting: Neurosurgery

## 2023-07-23 ENCOUNTER — Other Ambulatory Visit: Payer: Self-pay

## 2023-07-23 NOTE — Anesthesia Postprocedure Evaluation (Signed)
 Anesthesia Post Note  Patient: Jasmine Long  Procedure(s) Performed: KYPHOPLASTY THORACIC TWELVE     Patient location during evaluation: PACU Anesthesia Type: MAC Level of consciousness: awake and alert Pain management: pain level controlled Vital Signs Assessment: post-procedure vital signs reviewed and stable Respiratory status: spontaneous breathing, nonlabored ventilation and respiratory function stable Cardiovascular status: blood pressure returned to baseline and stable Postop Assessment: no apparent nausea or vomiting Anesthetic complications: no   No notable events documented.               Rick Carruthers

## 2023-07-29 ENCOUNTER — Telehealth: Payer: Self-pay

## 2023-07-29 NOTE — Progress Notes (Signed)
 Pt called me on 7/2 to discuss putting in a port after she sees Dr. Malcolm on 7/9. I told the pt that she would need to see Dr. Sherrod first before scheduling a port to start her immunotherapy.  I sent a message to Dr. Sherrod via IB about pt wanting to schedule a port to start her immunotherapy. Per IB from Dr. Sherrod, immunotherapy is not a consideration at this time due to multiple delays. I called the pt this morning and relayed this message to the pt. I told her that Dr. Sherrod is happy to monitor her with CT scans every 3 months, and if progression is noted, she can receive chemo with immunotherapy at that time. Pt was upset by this and hung up the phone.

## 2023-07-29 NOTE — Telephone Encounter (Signed)
 Copied from CRM [id number]. Topic: Referral - Request for Referral >> Jul 29, 2023 12:55 PM [name] wrote: Did the patient discuss referral with their provider in the last year? Yes (If No - schedule appointment) (If Yes - send message)  Appointment offered? No  Type of order/referral and detailed reason for visit: Immunologist  Preference of office, provider, location: No preference.  If referral order, have you been seen by this specialty before? No (If Yes, this issue or another issue? When? Where?  Can we respond through MyChart? No

## 2023-07-29 NOTE — Telephone Encounter (Signed)
 Copied from CRM [id number]. Topic: General - Other >> Jul 29, 2023 10:18 AM Berneda FALCON wrote: Reason for CRM: Pt states she would like Dr. Purcell to please give her a call about her cancer treatment. States it is really important that she speaks to him today please. Requested I send this as high importance  Pt callback number is [phone] (home) or [phone] (Cell)

## 2023-07-29 NOTE — Telephone Encounter (Signed)
 Patient calling to ask if she can start immunotherapy with the provider and if so when can she begin.  Patient is asking for a call back at #[phone], #[phone]

## 2023-08-04 ENCOUNTER — Telehealth: Payer: Self-pay | Admitting: Radiation Therapy

## 2023-08-04 DIAGNOSIS — S22080A Wedge compression fracture of T11-T12 vertebra, initial encounter for closed fracture: Secondary | ICD-10-CM | POA: Diagnosis not present

## 2023-08-04 NOTE — Telephone Encounter (Signed)
 I called Ms. Dechellis to check in to see how she is recovering from the kyphoplasty procedure and to see if she is ready to get the brain MRI rescheduled. She immediately cut me off and shared her recent frustrations about her medical oncology appointments being cancelled.   She said that she spoke w/ [facility] and they will cover immunotherapy. She is very upset that this has been cancelled. She understands that there was a delay in the start but does not want to forgo getting treated. I called her to discuss getting her brain MRI rescheduled but she does not want to do that if she will not get treatment. Says, having a brain MRI or radiation is not going to keep me from dying. She thinks that Dr. Sherrod has given up on her and wants to know what her expected time to live is. She is asking for a visit with Dr. Sherrod to discuss treatment stating that she does not know anything. Nobody has told me anything, all I know is Dr. Sherrod has cancelled everything and cut me off from treatment.  Ms. Viviani would like a visit with Dr. Sherrod to discuss her options and what is expected.  I have passed this along to [facility] and Cassie with a request to get her in to see Dr. Sherrod. They are working on this for her.   Devere Perch R.T(R)(T) Radiation Special Procedures Lead

## 2023-08-04 NOTE — Progress Notes (Signed)
 I called the pt back to let her know I had received her VM requesting I speak to Dr Sherrod about giving the pt immunotherapy. I told the pt that Dr. Sherrod is out of the office this week, however I can discuss it with him.  An appt was able to be made with Dr. Sherrod on 7/15 at 9am with labs at 8:30.  Pt called me to let me know she saw the appts on MyChart and wanted to confirm the dates and times. Times and date of appts confirmed. Pt verbalized understanding.

## 2023-08-10 ENCOUNTER — Ambulatory Visit: Payer: Self-pay

## 2023-08-10 ENCOUNTER — Other Ambulatory Visit

## 2023-08-10 ENCOUNTER — Encounter (HOSPITAL_COMMUNITY): Payer: Self-pay

## 2023-08-10 ENCOUNTER — Inpatient Hospital Stay

## 2023-08-10 ENCOUNTER — Emergency Department (HOSPITAL_COMMUNITY)
Admission: EM | Admit: 2023-08-10 | Discharge: 2023-08-10 | Disposition: A | Discharge status: Home / Self Care | Attending: Emergency Medicine | Admitting: Emergency Medicine

## 2023-08-10 ENCOUNTER — Other Ambulatory Visit: Payer: Self-pay

## 2023-08-10 ENCOUNTER — Ambulatory Visit: Admitting: Internal Medicine

## 2023-08-10 ENCOUNTER — Inpatient Hospital Stay: Attending: Hematology | Admitting: Internal Medicine

## 2023-08-10 VITALS — BP 152/60 | HR 87 | Temp 98.2°F | Resp 17 | Ht 69.0 in | Wt 177.7 lb

## 2023-08-10 DIAGNOSIS — Z7984 Long term (current) use of oral hypoglycemic drugs: Secondary | ICD-10-CM | POA: Diagnosis not present

## 2023-08-10 DIAGNOSIS — F17210 Nicotine dependence, cigarettes, uncomplicated: Secondary | ICD-10-CM | POA: Diagnosis not present

## 2023-08-10 DIAGNOSIS — Z85038 Personal history of other malignant neoplasm of large intestine: Secondary | ICD-10-CM | POA: Insufficient documentation

## 2023-08-10 DIAGNOSIS — Z85118 Personal history of other malignant neoplasm of bronchus and lung: Secondary | ICD-10-CM | POA: Diagnosis not present

## 2023-08-10 DIAGNOSIS — R310 Gross hematuria: Secondary | ICD-10-CM | POA: Diagnosis not present

## 2023-08-10 DIAGNOSIS — C3490 Malignant neoplasm of unspecified part of unspecified bronchus or lung: Secondary | ICD-10-CM

## 2023-08-10 DIAGNOSIS — R319 Hematuria, unspecified: Secondary | ICD-10-CM | POA: Insufficient documentation

## 2023-08-10 DIAGNOSIS — Z91199 Patient's noncompliance with other medical treatment and regimen due to unspecified reason: Secondary | ICD-10-CM | POA: Diagnosis not present

## 2023-08-10 DIAGNOSIS — C3412 Malignant neoplasm of upper lobe, left bronchus or lung: Secondary | ICD-10-CM

## 2023-08-10 DIAGNOSIS — C180 Malignant neoplasm of cecum: Secondary | ICD-10-CM

## 2023-08-10 DIAGNOSIS — C189 Malignant neoplasm of colon, unspecified: Secondary | ICD-10-CM | POA: Diagnosis not present

## 2023-08-10 DIAGNOSIS — C7931 Secondary malignant neoplasm of brain: Secondary | ICD-10-CM | POA: Diagnosis not present

## 2023-08-10 DIAGNOSIS — Z79899 Other long term (current) drug therapy: Secondary | ICD-10-CM | POA: Diagnosis not present

## 2023-08-10 DIAGNOSIS — E119 Type 2 diabetes mellitus without complications: Secondary | ICD-10-CM | POA: Insufficient documentation

## 2023-08-10 LAB — CBC WITH DIFFERENTIAL/PLATELET
Abs Immature Granulocytes: 0.01 K/uL (ref 0.00–0.07)
Basophils Absolute: 0 K/uL (ref 0.0–0.1)
Basophils Relative: 0 %
Eosinophils Absolute: 0.1 K/uL (ref 0.0–0.5)
Eosinophils Relative: 1 %
HCT: 35.5 % — ABNORMAL LOW (ref 36.0–46.0)
Hemoglobin: 11.5 g/dL — ABNORMAL LOW (ref 12.0–15.0)
Immature Granulocytes: 0 %
Lymphocytes Relative: 11 %
Lymphs Abs: 0.7 K/uL (ref 0.7–4.0)
MCH: 30.4 pg (ref 26.0–34.0)
MCHC: 32.4 g/dL (ref 30.0–36.0)
MCV: 93.9 fL (ref 80.0–100.0)
Monocytes Absolute: 0.6 K/uL (ref 0.1–1.0)
Monocytes Relative: 9 %
Neutro Abs: 5.2 K/uL (ref 1.7–7.7)
Neutrophils Relative %: 79 %
Platelets: 268 K/uL (ref 150–400)
RBC: 3.78 MIL/uL — ABNORMAL LOW (ref 3.87–5.11)
RDW: 14.1 % (ref 11.5–15.5)
WBC: 6.5 K/uL (ref 4.0–10.5)
nRBC: 0 % (ref 0.0–0.2)

## 2023-08-10 LAB — COMPREHENSIVE METABOLIC PANEL WITH GFR
ALT: 11 U/L (ref 0–44)
AST: 14 U/L — ABNORMAL LOW (ref 15–41)
Albumin: 4.1 g/dL (ref 3.5–5.0)
Alkaline Phosphatase: 108 U/L (ref 38–126)
Anion gap: 11 (ref 5–15)
BUN: 25 mg/dL — ABNORMAL HIGH (ref 8–23)
CO2: 23 mmol/L (ref 22–32)
Calcium: 9.6 mg/dL (ref 8.9–10.3)
Chloride: 104 mmol/L (ref 98–111)
Creatinine, Ser: 0.71 mg/dL (ref 0.44–1.00)
GFR, Estimated: >60 mL/min (ref >60)
Glucose, Bld: 113 mg/dL — ABNORMAL HIGH (ref 70–99)
Potassium: 4 mmol/L (ref 3.5–5.1)
Sodium: 138 mmol/L (ref 135–145)
Total Bilirubin: 0.7 mg/dL (ref 0.0–1.2)
Total Protein: 7.2 g/dL (ref 6.5–8.1)

## 2023-08-10 LAB — URINALYSIS, W/ REFLEX TO CULTURE (INFECTION SUSPECTED)
Bilirubin Urine: NEGATIVE
Glucose, UA: NEGATIVE mg/dL
Ketones, ur: NEGATIVE mg/dL
Nitrite: NEGATIVE
Protein, ur: NEGATIVE mg/dL
Specific Gravity, Urine: 1.004 — ABNORMAL LOW (ref 1.005–1.030)
pH: 6 (ref 5.0–8.0)

## 2023-08-10 LAB — CBC WITH DIFFERENTIAL (CANCER CENTER ONLY)
Abs Immature Granulocytes: 0.01 K/uL (ref 0.00–0.07)
Basophils Absolute: 0 K/uL (ref 0.0–0.1)
Basophils Relative: 0 %
Eosinophils Absolute: 0.1 K/uL (ref 0.0–0.5)
Eosinophils Relative: 1 %
HCT: 36.5 % (ref 36.0–46.0)
Hemoglobin: 12.1 g/dL (ref 12.0–15.0)
Immature Granulocytes: 0 %
Lymphocytes Relative: 13 %
Lymphs Abs: 0.8 K/uL (ref 0.7–4.0)
MCH: 30 pg (ref 26.0–34.0)
MCHC: 33.2 g/dL (ref 30.0–36.0)
MCV: 90.6 fL (ref 80.0–100.0)
Monocytes Absolute: 0.6 K/uL (ref 0.1–1.0)
Monocytes Relative: 9 %
Neutro Abs: 4.8 K/uL (ref 1.7–7.7)
Neutrophils Relative %: 77 %
Platelet Count: 270 K/uL (ref 150–400)
RBC: 4.03 MIL/uL (ref 3.87–5.11)
RDW: 13.9 % (ref 11.5–15.5)
WBC Count: 6.3 K/uL (ref 4.0–10.5)
nRBC: 0 % (ref 0.0–0.2)

## 2023-08-10 LAB — CMP (CANCER CENTER ONLY)
ALT: 8 U/L (ref 0–44)
AST: 11 U/L — ABNORMAL LOW (ref 15–41)
Albumin: 4.3 g/dL (ref 3.5–5.0)
Alkaline Phosphatase: 95 U/L (ref 38–126)
Anion gap: 9 (ref 5–15)
BUN: 22 mg/dL (ref 8–23)
CO2: 24 mmol/L (ref 22–32)
Calcium: 9.9 mg/dL (ref 8.9–10.3)
Chloride: 106 mmol/L (ref 98–111)
Creatinine: 1.05 mg/dL — ABNORMAL HIGH (ref 0.44–1.00)
GFR, Estimated: 57 mL/min — ABNORMAL LOW (ref >60)
Glucose, Bld: 110 mg/dL — ABNORMAL HIGH (ref 70–99)
Potassium: 4.2 mmol/L (ref 3.5–5.1)
Sodium: 139 mmol/L (ref 135–145)
Total Bilirubin: 0.7 mg/dL (ref 0.0–1.2)
Total Protein: 7.2 g/dL (ref 6.5–8.1)

## 2023-08-10 LAB — CEA (ACCESS): CEA (CHCC): 3.74 ng/mL (ref 0.00–5.00)

## 2023-08-10 LAB — TYPE AND SCREEN
ABO/RH(D): O POS
Antibody Screen: NEGATIVE

## 2023-08-10 LAB — PROTIME-INR
INR: 1 (ref 0.8–1.2)
Prothrombin Time: 13.4 s (ref 11.4–15.2)

## 2023-08-10 LAB — TSH: TSH: 2.04 u[IU]/mL (ref 0.350–4.500)

## 2023-08-10 MED ORDER — CEPHALEXIN 500 MG PO CAPS
500.0000 mg | ORAL_CAPSULE | Freq: Once | ORAL | Status: AC
  Administered 2023-08-10: 500 mg via ORAL
  Filled 2023-08-10: qty 1

## 2023-08-10 MED ORDER — CEPHALEXIN 500 MG PO CAPS: 500.0000 mg | ORAL_CAPSULE | Freq: Four times a day (QID) | ORAL | 0 refills | Status: DC

## 2023-08-10 MED ORDER — SODIUM CHLORIDE 0.9 % IV BOLUS
500.0000 mL | Freq: Once | INTRAVENOUS | Status: AC
  Administered 2023-08-10: 500 mL via INTRAVENOUS

## 2023-08-10 NOTE — Telephone Encounter (Signed)
 No need to see me for blood results.  I do not want her exposed to any more germs than absolutely necessary.  No further recommendations regarding immune system.

## 2023-08-10 NOTE — ED Notes (Signed)
 Patient d.c with home care instructions. IV discontinued. Patient wheeled to her car in wheelchair.

## 2023-08-10 NOTE — Telephone Encounter (Signed)
 FYI Only or Action Required?: FYI only for provider.  Patient was last seen in primary care on 05/03/2023 by [name], MD.  Called Nurse Triage reporting Vaginal Bleeding.  Symptoms began today.  Interventions attempted: Nothing.  Symptoms are: gradually worsening.  Triage Disposition: See HCP Within 4 Hours (Or PCP Triage)  Patient/caregiver understands and will follow disposition?: Yes   Copied from CRM [id number]. Topic: Clinical - Red Word Triage >> Aug 10, 2023  3:37 PM Viola F wrote: Red Word that prompted transfer to Nurse Triage: Patient having heavy bleeding from vaginal area , requested to speak with nurse immediately Reason for Disposition  MODERATE vaginal bleeding (e.g., soaking 1 pad or tampon per hour and present > 6 hours; 1 menstrual cup every 6 hours)  Answer Assessment - Initial Assessment Questions 1. BLEEDING SEVERITY: Describe the bleeding that you are having. How much bleeding is there?      Bright red blood, Pretty good amount, almost like a bad cut  2. ONSET: When did the bleeding begin? Is it continuing now?     Today  3. MENOPAUSE: When was your last menstrual period?      Years and Years ago  4. ABDOMEN PAIN: Do you have any pain? How bad is the pain?  (e.g., Scale 0-10; none, mild, moderate, or severe)     No  5. BLOOD THINNERS: Do you take any blood thinners? (e.g., Coumadin/warfarin, Pradaxa/dabigatran, aspirin )     No  6. HORMONE MEDICINES: Are you taking any hormone medicines, prescription or OTC? (e.g., birth control pills, estrogen)     No  7. CAUSE: What do you think is causing the bleeding? (e.g., recent gyn surgery, recent gyn procedure; known bleeding disorder, uterine cancer)       Recent lab work  8. HEMODYNAMIC STATUS: Are you weak or feeling lightheaded? If Yes, ask: Can you stand and walk normally?       No  9. OTHER SYMPTOMS: What other symptoms are you having with the bleeding? (e.g.,  back pain, burning with urination, fever)     No  Protocols used: Vaginal Bleeding - Postmenopausal-A-AH

## 2023-08-10 NOTE — Discharge Instructions (Signed)
 Take the antibiotics as prescribed for possible urinary tract infection.  Follow-up with urologist for further evaluation.  Return as needed for worsening symptoms.

## 2023-08-10 NOTE — Progress Notes (Signed)
 [facility] [phone]   [phone]  OFFICE PROGRESS NOTE  [name], MD [address] 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    PRIOR THERAPY:  1) SRS to solitary brain metastasis under the care of Dr. Patrcia  2) systemic chemotherapy with carboplatin  for an AUC 5 and etoposide  100 mg/m on days 1, 2, and 3 IV every 3 weeks.  Imfinzi will be added once she completes palliative radiation.  Status post 4 cycles.  This is concurrent with radiotherapy completed on April 05, 2023.  CURRENT THERAPY: Consolidation treatment with immunotherapy with Imfinzi 1500 Mg IV every 4 weeks.  First dose 08/24/2023.  INTERVAL HISTORY: Jasmine Long 73 y.o. female returns to the clinic today for follow-up visit.Discussed the use of AI scribe software for clinical note transcription with the patient, who gave verbal consent to proceed.  History of Present Illness Jasmine Long is a 73 year old female with extensive stage small cell lung cancer who presents for follow-up on delayed immunotherapy treatment.  She was diagnosed with extensive stage small cell lung cancer in December 2024 and has undergone stereotactic radiosurgery for a solitary brain metastasis, along with systemic chemotherapy using carboplatin  and etoposide  for four cycles concurrent with radiation therapy. Her consolidation treatment with immunotherapy using Imfinzi has been delayed for several months due to noncompliance.  She recently underwent kyphoplasty and is  experiencing residual adhesive material on her back. She reports back pain and weakness, stating that she is walking better without a cane but still uses it for safety. She cannot straighten her back fully.  She feels fine overall, with no significant breathing issues except for some discomfort due to pollen exposure. She is currently taking vitamins, including B12, and consuming a diet rich in vegetables and meat. Her hair is regrowing, although not yet long enough for color restoration.  She has been taking care of an [place] bulldog at home and recently went to the beach for a couple of weeks in May.     MEDICAL HISTORY: Past Medical History:  Diagnosis Date   Allergy    Anxiety    Cancer (HCC)    small cell lung cancer   Complication of anesthesia    per patient she was hard to wake up   Depression    Diabetes mellitus without complication (HCC)    type 2, does not check blood sugar   Heart murmur    never has caused any problems   Hypertension    Hypothyroidism    Neuromuscular disorder (HCC)    bilateral lower extremity - numbness   Pneumonia    as a child   Thyroid  disease    Ulcer     ALLERGIES:  is allergic to other.  MEDICATIONS:  Current Outpatient Medications  Medication Sig Dispense Refill   ALPRAZolam  (XANAX ) 0.5 MG tablet Take 1 tablet (0.5 mg total) by mouth 2 (two) times daily as needed for anxiety. 30 tablet 3   atorvastatin  (LIPITOR) 20 MG tablet Take 1 tablet by mouth once daily  90 tablet 1   buPROPion  (WELLBUTRIN  XL) 300 MG 24 hr tablet Take 1 tablet by mouth once daily 90 tablet 1   busPIRone  (BUSPAR ) 7.5 MG tablet Take 1 tablet (7.5 mg total) by mouth 2 (two) times daily. 60 tablet 1   clonazePAM  (KLONOPIN ) 1 MG tablet Take 1 mg by mouth daily as needed for anxiety (Sleep).     escitalopram  (LEXAPRO ) 10 MG tablet Take 1 tablet (10 mg total) by mouth daily. 90 tablet 3   gabapentin (NEURONTIN) 300 MG capsule Take 300 mg by mouth daily as needed  (numbness).     levothyroxine  (SYNTHROID ) 125 MCG tablet TAKE 1 TABLET BY MOUTH ONCE DAILY BEFORE BREAKFAST 90 tablet 0   lidocaine  (LIDODERM ) 5 % Place 1 patch onto the skin daily. Remove & Discard patch within 12 hours or as directed by MD (Patient not taking: Reported on 07/14/2023) 30 patch 0   lidocaine -prilocaine  (EMLA ) cream Apply 1 Application topically as needed. 30 g 2   metFORMIN  (GLUCOPHAGE ) 1000 MG tablet TAKE 1 TABLET BY MOUTH TWICE DAILY WITH A MEAL 180 tablet 1   metoprolol  succinate (TOPROL -XL) 25 MG 24 hr tablet Take 1 tablet by mouth once daily 90 tablet 3   nicotine  (NICODERM CQ ) 21 mg/24hr patch Place 1 patch (21 mg total) onto the skin daily. (Patient not taking: Reported on 07/14/2023) 28 patch 3   ondansetron  (ZOFRAN ) 8 MG tablet Take 1 tablet (8 mg total) by mouth every 8 (eight) hours as needed for nausea or vomiting. 30 tablet 1   pantoprazole  (PROTONIX ) 40 MG tablet Take 1 tablet twice a day for 2 weeks and then daily after that (Patient not taking: Reported on 07/14/2023) 90 tablet 3   sodium chloride  (OCEAN) 0.65 % SOLN nasal spray Place 1 spray into both nostrils daily as needed (Sinus).     traMADol  (ULTRAM ) 50 MG tablet Take 1 tablet (50 mg total) by mouth every 6 (six) hours as needed. 20 tablet 0   valsartan -hydrochlorothiazide  (DIOVAN -HCT) 160-12.5 MG tablet Take 1 tablet by mouth once daily 90 tablet 3   No current facility-administered medications for this visit.    SURGICAL HISTORY:  Past Surgical History:  Procedure Laterality Date   BREAST SURGERY Right    lumpectomy- benign   CESAREAN SECTION  1985   CHOLECYSTECTOMY  1980   open   COLONOSCOPY WITH PROPOFOL  N/A 10/30/2022   Procedure: COLONOSCOPY WITH PROPOFOL ;  Surgeon: Rollin Dover, MD;  Location: [facility];  Service: Gastroenterology;  Laterality: N/A;   ENDOSCOPIC MUCOSAL RESECTION  10/30/2022   Procedure: ENDOSCOPIC MUCOSAL RESECTION;  Surgeon: Rollin Dover, MD;  Location: [facility];   Service: Gastroenterology;;   HEMOSTASIS CLIP PLACEMENT  10/30/2022   Procedure: HEMOSTASIS CLIP PLACEMENT;  Surgeon: Rollin Dover, MD;  Location: [facility];  Service: Gastroenterology;;   KYPHOPLASTY N/A 07/20/2023   Procedure: KYPHOPLASTY THORACIC TWELVE;  Surgeon: Louis Shove, MD;  Location: [facility];  Service: Neurosurgery;  Laterality: N/A;   NASAL SEPTUM SURGERY     POLYPECTOMY  10/30/2022   Procedure: POLYPECTOMY;  Surgeon: Rollin Dover, MD;  Location: [facility];  Service: Gastroenterology;;   ROBLEY LIFTING INJECTION  10/30/2022   Procedure: SUBMUCOSAL LIFTING INJECTION;  Surgeon: Rollin Dover, MD;  Location: [facility];  Service: Gastroenterology;;    REVIEW OF SYSTEMS:  Constitutional: positive for fatigue Eyes: negative Ears, nose, mouth, throat, and face: negative Respiratory: negative Cardiovascular: negative Gastrointestinal: negative Genitourinary:negative Integument/breast: negative Hematologic/lymphatic: negative Musculoskeletal:positive for back pain Neurological: negative Behavioral/Psych:  negative Endocrine: negative Allergic/Immunologic: negative   PHYSICAL EXAMINATION: General appearance: alert, cooperative, fatigued, and no distress Head: Normocephalic, without obvious abnormality, atraumatic Neck: no adenopathy, no JVD, supple, symmetrical, trachea midline, and thyroid  not enlarged, symmetric, no tenderness/mass/nodules Lymph nodes: Cervical, supraclavicular, and axillary nodes normal. Resp: clear to auscultation bilaterally Back: symmetric, no curvature. ROM normal. No CVA tenderness. Cardio: regular rate and rhythm, S1, S2 normal, no murmur, click, rub or gallop GI: soft, non-tender; bowel sounds normal; no masses,  no organomegaly Extremities: extremities normal, atraumatic, no cyanosis or edema Neurologic: Alert and oriented X 3, normal strength and tone. Normal symmetric reflexes. Normal coordination and gait  ECOG PERFORMANCE STATUS: 1 -  Symptomatic but completely ambulatory  Blood pressure (!) 152/60, pulse 87, temperature 98.2 F (36.8 C), temperature source Temporal, resp. rate 17, height 5' 9 (1.753 m), weight 177 lb 11.2 oz (80.6 kg), SpO2 100%.  LABORATORY DATA: Lab Results  Component Value Date   WBC 6.3 08/10/2023   HGB 12.1 08/10/2023   HCT 36.5 08/10/2023   MCV 90.6 08/10/2023   PLT 270 08/10/2023      Chemistry      Component Value Date/Time   NA 141 07/20/2023 0705   NA 134 09/11/2022 1259   K 4.2 07/20/2023 0705   CL 107 07/20/2023 0705   CO2 22 07/20/2023 0705   BUN 23 07/20/2023 0705   BUN 12 09/11/2022 1259   CREATININE 0.93 07/20/2023 0705   CREATININE 1.08 (H) 04/26/2023 0820   CREATININE 0.98 10/30/2013 1118      Component Value Date/Time   CALCIUM  9.5 07/20/2023 0705   ALKPHOS 94 04/26/2023 0820   AST 11 (L) 04/26/2023 0820   ALT 8 04/26/2023 0820   BILITOT 0.4 04/26/2023 0820       RADIOGRAPHIC STUDIES: DG THORACOLUMBAR SPINE Result Date: 07/20/2023 CLINICAL DATA:  Elective surgery.  T12 kyphoplasty. EXAM: THORACOLUMBAR SPINE 1V COMPARISON:  Lumbar spine MRI 05/22/2023 FINDINGS: Three fluoroscopic spot views of the thoracolumbar spine submitted from the operating room. Imaging obtained during T12 compression fracture kyphoplasty. Fluoroscopy time 3 minutes 47 seconds. Dose 76.4 mGy. IMPRESSION: Procedural fluoroscopy during T12 kyphoplasty. Electronically Signed   By: Andrea Gasman M.D.   On: 07/20/2023 11:14   DG C-Arm 1-60 Min-No Report Result Date: 07/20/2023 Fluoroscopy was utilized by the requesting physician.  No radiographic interpretation.    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  She is  currently undergoing systemic chemotherapy with carboplatin  for AUC of 5 on day 1 and etoposide  100 Mg/M2 on days 1, 2 and 3 status post 4 cycles.  This is concurrent with radiotherapy treated as limited stage disease because of the limited volume of her disease in the left lung.  She was supposed to start consolidation treatment with immunotherapy immediately after her induction phase but due to noncompliance this treatment has not been started yet. Assessment and Plan Assessment & Plan Extensive stage small cell lung cancer Diagnosed in December 2024. Treated with SRS to solitary brain metastases and systemic chemotherapy with carboplatin  and camautoboside for four cycles concurrent with radiation. Consolidation treatment with immunotherapy (Imfinzi) delayed due to noncompliance. Current concern about the efficacy of starting immunotherapy after a significant delay. - Order CT scan  of chest, abdomen, and pelvis to assess cancer status - Evaluate CT scan results to determine if immunotherapy can be initiated - If cancer is stable, consider starting immunotherapy with Imfinzi - If cancer is progressing, plan to resume chemotherapy  Early stage colon adenocarcinoma No specific discussion or changes in management during this visit.  Compression fracture of the back Contributing to back weakness and difficulty in straightening the back. Recent chiroplasty performed. - Coordinate with physical therapy for back and leg rehabilitation  Diverticulitis The patient was advised to call immediately if she has any concerning symptoms in the interval.  The patient voices understanding of current disease status and treatment options and is in agreement with the current care plan.  All questions were answered. The patient knows to call the clinic with any problems, questions or concerns. We can certainly see the patient much sooner if necessary.  The total time spent in the appointment was 30 minutes  including review of chart and various tests results, discussions about plan of care and coordination of care plan .   Disclaimer: This note was dictated with voice recognition software. Similar sounding words can inadvertently be transcribed and may not be corrected upon review.

## 2023-08-10 NOTE — ED Provider Notes (Signed)
 [place] EMERGENCY DEPARTMENT AT [facility] Provider Note   CSN: [id number] Arrival date & time: 08/10/23  1630     Patient presents with: Vaginal Bleeding   Jasmine Long is a 73 y.o. female.    Vaginal Bleeding    Patient has a history of depression thyroid  disease diabetes pneumonia, colon, lung cancer.  Patient's cancer is metastatic in nature.  She has undergone stereotactic radiosurgery of her brain metastases.  She is also has had chemotherapy.  Patient states she had been doing well.  While she was at home today patient states she was urinating and noticed blood in her vaginal area.  Patient was not sure if the blood was coming from the urethral area.  She denies any blood in her stool.  Prior to Admission medications   Medication Sig Start Date End Date Taking? Authorizing Provider  cephALEXin  (KEFLEX ) 500 MG capsule Take 1 capsule (500 mg total) by mouth 4 (four) times daily. 08/10/23  Yes Randol Simmonds, MD  ALPRAZolam  (XANAX ) 0.5 MG tablet Take 1 tablet (0.5 mg total) by mouth 2 (two) times daily as needed for anxiety. 02/09/23   Sagardia, Miguel Jose, MD  atorvastatin  (LIPITOR) 20 MG tablet Take 1 tablet by mouth once daily 08/18/22   Sagardia, Miguel Jose, MD  buPROPion  (WELLBUTRIN  XL) 300 MG 24 hr tablet Take 1 tablet by mouth once daily 08/18/22   Sagardia, Miguel Jose, MD  busPIRone  (BUSPAR ) 7.5 MG tablet Take 1 tablet (7.5 mg total) by mouth 2 (two) times daily. 07/16/22   [name], MD  clonazePAM  (KLONOPIN ) 1 MG tablet Take 1 mg by mouth daily as needed for anxiety (Sleep).    [provider]  escitalopram  (LEXAPRO ) 10 MG tablet Take 1 tablet (10 mg total) by mouth daily. 12/25/21 07/20/23  Sagardia, Miguel Jose, MD  gabapentin (NEURONTIN) 300 MG capsule Take 300 mg by mouth daily as needed (numbness). 08/14/22   [provider]  levothyroxine  (SYNTHROID ) 125 MCG tablet TAKE 1 TABLET BY MOUTH ONCE DAILY BEFORE BREAKFAST 04/14/22    [name], MD  lidocaine  (LIDODERM ) 5 % Place 1 patch onto the skin daily. Remove & Discard patch within 12 hours or as directed by MD Patient not taking: Reported on 07/14/2023 01/12/23   Franklyn Sid SAILOR, MD  lidocaine -prilocaine  (EMLA ) cream Apply 1 Application topically as needed. 02/04/23   Heilingoetter, Cassandra L, PA-C  metFORMIN  (GLUCOPHAGE ) 1000 MG tablet TAKE 1 TABLET BY MOUTH TWICE DAILY WITH A MEAL 08/18/22   Sagardia, Miguel Jose, MD  metoprolol  succinate (TOPROL -XL) 25 MG 24 hr tablet Take 1 tablet by mouth once daily 05/27/22   Sagardia, Miguel Jose, MD  nicotine  (NICODERM CQ ) 21 mg/24hr patch Place 1 patch (21 mg total) onto the skin daily. Patient not taking: Reported on 07/14/2023 11/26/22   [name], MD  ondansetron  (ZOFRAN ) 8 MG tablet Take 1 tablet (8 mg total) by mouth every 8 (eight) hours as needed for nausea or vomiting. 01/15/23   Bruning, Ashlyn, PA-C  pantoprazole  (PROTONIX ) 40 MG tablet Take 1 tablet twice a day for 2 weeks and then daily after that Patient not taking: Reported on 07/14/2023 08/08/22   Sagardia, Miguel Jose, MD  sodium chloride  (OCEAN) 0.65 % SOLN nasal spray Place 1 spray into both nostrils daily as needed (Sinus).    [provider]  traMADol  (ULTRAM ) 50 MG tablet Take 1 tablet (50 mg total) by mouth every 6 (six) hours as needed. 07/20/23 07/19/24  Louis Shove, MD  valsartan -hydrochlorothiazide  (DIOVAN -HCT) 160-12.5 MG tablet Take 1 tablet by mouth once daily 05/27/22   Sagardia, Miguel Jose, MD    Allergies: Other    Review of Systems  Genitourinary:  Positive for vaginal bleeding.    Updated Vital Signs BP (!) 149/63 (BP Location: Left Arm)   Pulse (!) 111   Temp 97.8 F (36.6 C) (Oral)   Resp 20   SpO2 97%   Physical Exam Vitals and nursing note reviewed. Exam conducted with a chaperone present.  Constitutional:      Appearance: She is well-developed. She is not diaphoretic.  HENT:     Head: Normocephalic and  atraumatic.     Right Ear: External ear normal.     Left Ear: External ear normal.     Mouth/Throat:     Mouth: Mucous membranes are dry.  Eyes:     General: No scleral icterus.       Right eye: No discharge.        Left eye: No discharge.     Conjunctiva/sclera: Conjunctivae normal.  Neck:     Trachea: No tracheal deviation.  Cardiovascular:     Rate and Rhythm: Normal rate and regular rhythm.  Pulmonary:     Effort: Pulmonary effort is normal. No respiratory distress.     Breath sounds: Normal breath sounds. No stridor. No wheezing or rales.  Abdominal:     General: Bowel sounds are normal. There is no distension.     Palpations: Abdomen is soft.     Tenderness: There is no abdominal tenderness. There is no guarding or rebound.  Genitourinary:    Labia:        Right: No injury.        Left: No injury.      Vagina: No bleeding.     Comments: No blood noted at the vaginal opening, no blood noted at the urethra, no blood noted on rectal exam, brown stool Musculoskeletal:        General: No tenderness or deformity.     Cervical back: Neck supple.  Skin:    General: Skin is warm and dry.     Findings: No rash.  Neurological:     General: No focal deficit present.     Mental Status: She is alert.     Cranial Nerves: No cranial nerve deficit, dysarthria or facial asymmetry.     Sensory: No sensory deficit.     Motor: No abnormal muscle tone or seizure activity.     Coordination: Coordination normal.  Psychiatric:        Mood and Affect: Mood normal.     (all labs ordered are listed, but only abnormal results are displayed) Labs Reviewed  COMPREHENSIVE METABOLIC PANEL WITH GFR - Abnormal; Notable for the following components:      Result Value   Glucose, Bld 113 (*)    BUN 25 (*)    AST 14 (*)    All other components within normal limits  CBC WITH DIFFERENTIAL/PLATELET - Abnormal; Notable for the following components:   RBC 3.78 (*)    Hemoglobin 11.5 (*)    HCT 35.5  (*)    All other components within normal limits  URINALYSIS, W/ REFLEX TO CULTURE (INFECTION SUSPECTED) - Abnormal; Notable for the following components:   Color, Urine STRAW (*)    Specific Gravity, Urine 1.004 (*)    Hgb urine dipstick MODERATE (*)    Leukocytes,Ua TRACE (*)  Bacteria, UA RARE (*)    All other components within normal limits  URINE CULTURE  PROTIME-INR  TYPE AND SCREEN  ABO/RH    EKG: None  Radiology: No results found.   Procedures   Medications Ordered in the ED  cephALEXin  (KEFLEX ) capsule 500 mg (has no administration in time range)  sodium chloride  0.9 % bolus 500 mL (500 mLs Intravenous New Bag/Given 08/10/23 1753)    Clinical Course as of 08/10/23 1930  Tue Aug 10, 2023  1904 Urinalysis, w/ Reflex to Culture (Infection Suspected) -Urine, Clean Catch(!) Urinalysis does show moderate hemoglobin.,  No definite infection [JK]  1904 CBC with Differential(!) Hemoglobin 11.5.,  Similar to previous. [JK]  1905 Comprehensive metabolic panel(!) No significant metabolic abnormalities [JK]    Clinical Course User Index [JK] Randol Simmonds, MD                                 Medical Decision Making Amount and/or Complexity of Data Reviewed Labs: ordered. Decision-making details documented in ED Course.  Risk Prescription drug management.   Patient presented to the ED for evaluation of blood that she noted in her underwear today.  Patient was not sure if it was coming from her vaginal area or her bladder.  In the ED she did not have any signs of rectal bleeding on exam.  Examination of her vagina did not show any signs of blood in the vaginal vault.  Patient did have some blood in her urine.  I suspect this is most likely source although there was not gross hematuria when she gave her sample.  There is rare bacteria trace leukocyte esterase.  Not definitive for infection but it is a possibility.  Will start a course of antibiotics.  Recommend outpatient  follow-up with urology.     Final diagnoses:  Hematuria, unspecified type    ED Discharge Orders          Ordered    cephALEXin  (KEFLEX ) 500 MG capsule  4 times daily        08/10/23 1930               Randol Simmonds, MD 08/10/23 1932

## 2023-08-10 NOTE — ED Notes (Signed)
 Patient resting in bed.

## 2023-08-10 NOTE — Progress Notes (Signed)
 Navigator saw the pt at her follow up appt with Dr. Sherrod regarding current treatment plan. Dr. Sherrod requires the pt to have a CT scan to assess response to chemotherapy last received in April 2025. If pt's scan is stable, Dr Sherrod can start the pt on immunotherapy. If scan suggests that the pt's disease has progressed, she will need to start on chemotherapy again. Pt verbalized understanding of plan. Navigator escorted pt to scheduling team to make f/u with Dr Sherrod after her scan. I told patient we will hold off on scheduling the port until we know if she will be getting immunotherapy alone or chemotherapy which would require a port. Pt is comfortable waiting for a port at this time.

## 2023-08-10 NOTE — ED Triage Notes (Signed)
 Pt reports that she went to the restroom this afternoon and had a large amount of blood in her underwear. Pt reports a large amount of vaginal blood when she wiped. Pt lung CA pt.

## 2023-08-11 ENCOUNTER — Inpatient Hospital Stay

## 2023-08-11 LAB — URINE CULTURE: Culture: NO GROWTH

## 2023-08-11 LAB — POC OCCULT BLOOD, ED: Fecal Occult Bld: NEGATIVE

## 2023-08-11 LAB — T4: T4, Total: 8.2 ug/dL (ref 4.5–12.0)

## 2023-08-12 ENCOUNTER — Telehealth: Payer: Self-pay

## 2023-08-12 NOTE — Telephone Encounter (Signed)
 Copied from CRM [id number]. Topic: Referral - Request for Referral >> Aug 12, 2023 11:40 AM Martinique E wrote: Did the patient discuss referral with their provider in the last year? Yes (If No - schedule appointment) (If Yes - send message)  Appointment offered? Yes, for an ED follow-up.  Type of order/referral and detailed reason for visit: Urology. Offered a follow-up OV with PCP as she was in the ED 7/15, but patient refused and just wanted a referral placed.  Preference of office, provider, location: Any facility in-network.  If referral order, have you been seen by this specialty before? No (If Yes, this issue or another issue? When? Where?  Can we respond through MyChart? No, phone call preferred

## 2023-08-16 ENCOUNTER — Other Ambulatory Visit

## 2023-08-16 ENCOUNTER — Ambulatory Visit

## 2023-08-16 ENCOUNTER — Ambulatory Visit: Admitting: Internal Medicine

## 2023-08-16 NOTE — Telephone Encounter (Signed)
 Copied from CRM [id number]. Topic: Appointments - Scheduling Inquiry for Clinic >> Aug 16, 2023 11:12 AM Mesmerise C wrote: Reason for CRM: Patient stated she received a message that she missed an appointment with Dr. Rollene advised she did show up but was refused service on 7/15 about some bleeding stated they didn't have record for it but told her to go to Urgent Care or ER  would like that updated to show that it's not a No Show

## 2023-08-17 ENCOUNTER — Encounter (HOSPITAL_COMMUNITY): Payer: Self-pay

## 2023-08-17 ENCOUNTER — Encounter: Payer: Self-pay | Admitting: Internal Medicine

## 2023-08-17 ENCOUNTER — Ambulatory Visit (HOSPITAL_COMMUNITY)
Admission: RE | Admit: 2023-08-17 | Discharge: 2023-08-17 | Disposition: A | Discharge status: Home / Self Care | Source: Ambulatory Visit | Attending: Internal Medicine | Admitting: Internal Medicine

## 2023-08-17 DIAGNOSIS — C3490 Malignant neoplasm of unspecified part of unspecified bronchus or lung: Secondary | ICD-10-CM | POA: Diagnosis not present

## 2023-08-17 DIAGNOSIS — C3412 Malignant neoplasm of upper lobe, left bronchus or lung: Secondary | ICD-10-CM | POA: Diagnosis not present

## 2023-08-17 DIAGNOSIS — C189 Malignant neoplasm of colon, unspecified: Secondary | ICD-10-CM | POA: Diagnosis not present

## 2023-08-17 DIAGNOSIS — C7931 Secondary malignant neoplasm of brain: Secondary | ICD-10-CM | POA: Diagnosis not present

## 2023-08-17 DIAGNOSIS — R590 Localized enlarged lymph nodes: Secondary | ICD-10-CM | POA: Diagnosis not present

## 2023-08-17 MED ORDER — IOHEXOL 300 MG/ML  SOLN
100.0000 mL | Freq: Once | INTRAMUSCULAR | Status: AC | PRN
  Administered 2023-08-17: 70 mL via INTRAVENOUS

## 2023-08-17 NOTE — Telephone Encounter (Signed)
 Patient was seen in ED for this

## 2023-08-17 NOTE — Telephone Encounter (Signed)
 Please refer to urologist.  Thanks.

## 2023-08-17 NOTE — Progress Notes (Signed)
 Navigator called to clarify the reason for the infusion appt on 7/31 after her lab and MD appt on 7/31. Navigator let the pt know that the infusions appt is there in the event that her imaging shows no progression and that she can start the immunotherapy that day. However, if her imaging does show progression, that appt will be cancelled and she will need to start a chemotherapy regimen another day. Pt verbalizes understanding of this plan. Navigator asked the pt if she would like to have a port placed. Pt states she will only get a port if she has to start chemotherapy as that requires many more venous accesses. Navigator verbalizes understanding.

## 2023-08-18 ENCOUNTER — Other Ambulatory Visit: Payer: Self-pay | Admitting: Radiology

## 2023-08-18 NOTE — Telephone Encounter (Signed)
 Copied from CRM [id number]. Topic: Referral - Question >> Aug 18, 2023 11:24 AM Viola FALCON wrote: Reason for CRM: Patient had referral to Urology put in today for Urologist in [facility] but she needs a Insurance underwriter in [place]. Please let her know when new referral is in via MyChart.

## 2023-08-18 NOTE — Telephone Encounter (Signed)
 Referral placed.

## 2023-08-18 NOTE — Addendum Note (Signed)
 Addended by: ROSALVA LEX RAMAN on: 08/18/2023 09:22 AM   Modules accepted: Orders

## 2023-08-23 NOTE — Telephone Encounter (Signed)
 CT scan of abdomen and pelvis, impression as follows: 1. Stable findings of small cell lung cancer, including multiple unchanged pulmonary nodules. 2. No evidence of acute diverticulitis. No new concerns.

## 2023-08-24 NOTE — Progress Notes (Unsigned)
 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   PRIOR THERAPY: 1) SRS to solitary brain metastasis under the care of Dr. Patrcia 2) systemic chemotherapy with carboplatin  for an AUC 5 and etoposide  100 mg/m on days 1, 2, and 3 IV every 3 weeks.  Imfinzi will be added once she completes palliative radiation.  Status post 4 cycles.  This is concurrent with radiotherapy completed on April 05, 2023.  CURRENT THERAPY: Consolidation treatment with immunotherapy with Imfinzi 1500 Mg IV every 4 weeks.  First dose 731/2025.   INTERVAL HISTORY: Jasmine Long 73 y.o. female returns to the clinic today for a follow-up visit.  The patient was last seen by Dr. Sherrod on 08/10/2023. In summary the patient was diagnosed with extensive stage small cell lung cancer in December 2024.  She underwent SRS to the solitary brain lesion. She then underwent 4 cycles of chemotherpy. The patient was supposed to start maintenance immunotherapy in March but the patient had some delay secondary to wanting to seek a second opinion from [facility].  She then had vacations planned and also wished to have a Port-A-Cath placed.  The patient was scheduled to here to start her immunotherapy today. However, she has a conflict with an appointment with an attorney later today. Therefore, she is wishing to reschedule for next week. She does not have a Port-A-Cath as she decided she does not want a Port-A-Cath unless she requires  chemotherapy again in the future.  In the interval since being seen she was seen in the emergency room with vaginal bleeding.  Today she denies any fever, chills, night sweats, or unexplained weight loss.  Her breathing is good.  She denies any chest pain, shortness of breath, or hemoptysis. She only has coughing related to sinuses. She has struggled with issues with her sinuses since she was 73 years old. She denies any nausea, vomiting, diarrhea, or constipation.  She denies any headache or visual changes.  She denies any rashes or skin changes.  She had a restaging CT scan performed recently.  She is here today for evaluation and to review her scan before undergoing cycle #1 which we will reschedule for next week.     MEDICAL HISTORY: Past Medical History:  Diagnosis Date   Allergy    Anxiety    Cancer (HCC)    small cell lung cancer   Complication of anesthesia    per patient she was hard to wake up   Depression    Diabetes mellitus without complication (HCC)    type 2, does not check blood sugar   Heart murmur    never has caused any problems   Hypertension    Hypothyroidism    Neuromuscular disorder (HCC)    bilateral lower extremity - numbness   Pneumonia    as a child   Thyroid  disease    Ulcer     ALLERGIES:  is allergic to other.  MEDICATIONS:  Current Outpatient Medications  Medication Sig Dispense Refill   ALPRAZolam  (XANAX )  0.5 MG tablet Take 1 tablet (0.5 mg total) by mouth 2 (two) times daily as needed for anxiety. 30 tablet 3   atorvastatin  (LIPITOR) 20 MG tablet Take 1 tablet by mouth once daily 90 tablet 1   buPROPion  (WELLBUTRIN  XL) 300 MG 24 hr tablet Take 1 tablet by mouth once daily 90 tablet 1   busPIRone  (BUSPAR ) 7.5 MG tablet Take 1 tablet (7.5 mg total) by mouth 2 (two) times daily. 60 tablet 1   cephALEXin  (KEFLEX ) 500 MG capsule Take 1 capsule (500 mg total) by mouth 4 (four) times daily. 28 capsule 0   clonazePAM  (KLONOPIN ) 1 MG tablet Take 1 mg  by mouth daily as needed for anxiety (Sleep).     escitalopram  (LEXAPRO ) 10 MG tablet Take 1 tablet (10 mg total) by mouth daily. 90 tablet 3   gabapentin (NEURONTIN) 300 MG capsule Take 300 mg by mouth daily as needed (numbness).     levothyroxine  (SYNTHROID ) 125 MCG tablet TAKE 1 TABLET BY MOUTH ONCE DAILY BEFORE BREAKFAST 90 tablet 0   lidocaine  (LIDODERM ) 5 % Place 1 patch onto the skin daily. Remove & Discard patch within 12 hours or as directed by MD (Patient not taking: Reported on 07/14/2023) 30 patch 0   lidocaine -prilocaine  (EMLA ) cream Apply 1 Application topically as needed. 30 g 2   metFORMIN  (GLUCOPHAGE ) 1000 MG tablet TAKE 1 TABLET BY MOUTH TWICE DAILY WITH A MEAL 180 tablet 1   metoprolol  succinate (TOPROL -XL) 25 MG 24 hr tablet Take 1 tablet by mouth once daily 90 tablet 3   nicotine  (NICODERM CQ ) 21 mg/24hr patch Place 1 patch (21 mg total) onto the skin daily. (Patient not taking: Reported on 07/14/2023) 28 patch 3   ondansetron  (ZOFRAN ) 8 MG tablet Take 1 tablet (8 mg total) by mouth every 8 (eight) hours as needed for nausea or vomiting. 30 tablet 1   pantoprazole  (PROTONIX ) 40 MG tablet Take 1 tablet twice a day for 2 weeks and then daily after that (Patient not taking: Reported on 07/14/2023) 90 tablet 3   sodium chloride  (OCEAN) 0.65 % SOLN nasal spray Place 1 spray into both nostrils daily as needed (Sinus).     traMADol  (ULTRAM ) 50 MG tablet Take 1 tablet (50 mg total) by mouth every 6 (six) hours as needed. 20 tablet 0   valsartan -hydrochlorothiazide  (DIOVAN -HCT) 160-12.5 MG tablet Take 1 tablet by mouth once daily 90 tablet 3   No current facility-administered medications for this visit.    SURGICAL HISTORY:  Past Surgical History:  Procedure Laterality Date   BREAST SURGERY Right    lumpectomy- benign   CESAREAN SECTION  1985   CHOLECYSTECTOMY  1980   open   COLONOSCOPY WITH PROPOFOL  N/A 10/30/2022   Procedure: COLONOSCOPY WITH PROPOFOL ;  Surgeon: Rollin Dover,  MD;  Location: [facility];  Service: Gastroenterology;  Laterality: N/A;   ENDOSCOPIC MUCOSAL RESECTION  10/30/2022   Procedure: ENDOSCOPIC MUCOSAL RESECTION;  Surgeon: Rollin Dover, MD;  Location: [facility];  Service: Gastroenterology;;   HEMOSTASIS CLIP PLACEMENT  10/30/2022   Procedure: HEMOSTASIS CLIP PLACEMENT;  Surgeon: Rollin Dover, MD;  Location: [facility];  Service: Gastroenterology;;   KYPHOPLASTY N/A 07/20/2023   Procedure: KYPHOPLASTY THORACIC TWELVE;  Surgeon: Louis Shove, MD;  Location: [facility] OR;  Service: Neurosurgery;  Laterality: N/A;   NASAL SEPTUM SURGERY     POLYPECTOMY  10/30/2022   Procedure: POLYPECTOMY;  Surgeon: Rollin Dover, MD;  Location: [facility];  Service: Gastroenterology;;   SUBMUCOSAL LIFTING INJECTION  10/30/2022   Procedure: SUBMUCOSAL LIFTING INJECTION;  Surgeon: Rollin Dover, MD;  Location: [facility];  Service: Gastroenterology;;    REVIEW OF SYSTEMS:   Review of Systems  Constitutional: Negative for appetite change, chills, fatigue, fever and unexpected weight change.  HENT: Positive for chronic sinus congestion. Negative for mouth sores, nosebleeds, sore throat and trouble swallowing.   Eyes: Negative for eye problems and icterus.  Respiratory: Negative for cough, hemoptysis, shortness of breath and wheezing.   Cardiovascular: Negative for chest pain and leg swelling.  Gastrointestinal: Negative for abdominal pain, constipation, diarrhea, nausea and vomiting.  Genitourinary: Negative for bladder incontinence, difficulty urinating, dysuria, frequency and hematuria.   Musculoskeletal: Negative for back pain, gait problem, neck pain and neck stiffness.  Skin: Negative for itching and rash.  Neurological: Negative for dizziness, extremity weakness, gait problem, headaches, light-headedness and seizures.  Hematological: Negative for adenopathy. Does not bruise/bleed easily.  Psychiatric/Behavioral: Negative for confusion, depression and sleep  disturbance. The patient is not nervous/anxious.     PHYSICAL EXAMINATION:  There were no vitals taken for this visit.  ECOG PERFORMANCE STATUS: 2  Physical Exam  Constitutional: Oriented to person, place, and time and chronically ill appearing female, and in no distress.  HENT:  Head: Normocephalic and atraumatic.  Mouth/Throat: Oropharynx is clear and moist. No oropharyngeal exudate.  Eyes: Conjunctivae are normal. Right eye exhibits no discharge. Left eye exhibits no discharge. No scleral icterus.  Neck: Normal range of motion. Neck supple.  Cardiovascular: Normal rate, regular rhythm, normal heart sounds and intact distal pulses.   Pulmonary/Chest: Effort normal and breath sounds normal. No respiratory distress. No wheezes. No rales.  Abdominal: Soft. Bowel sounds are normal. Exhibits no distension and no mass. There is no tenderness.  Musculoskeletal: Normal range of motion. Exhibits no edema.  Lymphadenopathy:    No cervical adenopathy.  Neurological: Alert and oriented to person, place, and time. Exhibits muscle wasting. She was examined in the wheelchair.  Skin: Skin is warm and dry. No rash noted. Not diaphoretic. No erythema. No pallor.  Psychiatric: Mood, memory and judgment normal.  Vitals reviewed.  LABORATORY DATA: Lab Results  Component Value Date   WBC 6.5 08/10/2023   HGB 11.5 (L) 08/10/2023   HCT 35.5 (L) 08/10/2023   MCV 93.9 08/10/2023   PLT 268 08/10/2023      Chemistry      Component Value Date/Time   NA 138 08/10/2023 1748   NA 134 09/11/2022 1259   K 4.0 08/10/2023 1748   CL 104 08/10/2023 1748   CO2 23 08/10/2023 1748   BUN 25 (H) 08/10/2023 1748   BUN 12 09/11/2022 1259   CREATININE 0.71 08/10/2023 1748   CREATININE 1.05 (H) 08/10/2023 0852   CREATININE 0.98 10/30/2013 1118      Component Value Date/Time   CALCIUM  9.6 08/10/2023 1748   ALKPHOS 108 08/10/2023 1748   AST 14 (L) 08/10/2023 1748   AST 11 (L) 08/10/2023 0852   ALT 11  08/10/2023 1748   ALT 8 08/10/2023 0852   BILITOT 0.7 08/10/2023 1748   BILITOT 0.7 08/10/2023 0852       RADIOGRAPHIC STUDIES:  CT CHEST ABDOMEN PELVIS W CONTRAST Result Date: 08/23/2023 EXAM: CT CHEST, ABDOMEN AND PELVIS WITH CONTRAST 08/17/2023 02:22:13 PM TECHNIQUE: CT of the chest, abdomen and pelvis was performed with the administration of intravenous contrast. Multiplanar reformatted images are provided for review. Automated exposure control, iterative reconstruction, and/or weight based adjustment of the mA/kV was utilized to  reduce the radiation dose to as low as reasonably achievable. COMPARISON: 05/11/2023 CLINICAL HISTORY: Small cell lung cancer (SCLC), staging. 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 PRIOR THERAPY: 1) SRS to solitary brain metastasis under the care of Dr. Patrcia; 2) systemic chemotherapy with carboplatin  for an AUC 5 and etoposide  100 mg/m2 on days 1, 2, and 3 IV every 3 weeks. Imfinzi will be added once she completes palliative radiation. Status post 4 cycles. This is concurrent with radiotherapy completed on April 05, 2023. CURRENT THERAPY: Consolidation treatment with immunotherapy with Imfinzi 1500 Mg IV every 4 weeks. First dose 08/24/2023 FINDINGS: CHEST: MEDIASTINUM: Trace pericardial effusion is unchanged. Coronary artery calcifications. THORACIC LYMPH NODES: No mediastinal, hilar or axillary lymphadenopathy. LUNGS AND PLEURA: Posterior left upper lobe lung nodule measures 5 mm, axial image 30. Unchanged from previous exam. Subpleural nodular  density overlying the lateral left upper lobe is unchanged measuring 2.9 x 0.9 cm, image 40/5. Flattened discoid appearing subpleural nodule within the lateral left upper lobe measures 7 mm, coronal image 70. Unchanged from previous exam. Peripheral nodule in the right lower lobe measures 3 mm and is unchanged from prior exam, axial image 78. Irregular nodule within the right upper lobe measures 0.9 x 0.3 cm axial image 53. Unchanged from prior exam. ABDOMEN AND PELVIS: LIVER: The liver is unremarkable. GALLBLADDER AND BILE DUCTS: Status post cholecystectomy. Stable mild intrahepatic and common bile duct dilatation which in the absence of signs of biliary obstruction likely reflects post cholecystectomy physiology. SPLEEN: No acute abnormality. PANCREAS: No acute abnormality. ADRENAL GLANDS: No acute abnormality. KIDNEYS, URETERS AND BLADDER: No stones in the kidneys or ureters. No hydronephrosis. No perinephric or periureteral stranding. Urinary bladder is unremarkable. GI AND BOWEL: Tiny hiatal hernia. The appendix is visualized and appears normal axial image 91. Mild stool burden noted throughout the colon. Colonic diverticulosis without signs of acute diverticulitis. REPRODUCTIVE ORGANS: No acute abnormality. PERITONEUM AND RETROPERITONEUM: No ascites. No free air. VASCULATURE: Aortic atherosclerosis. ABDOMINAL AND PELVIS LYMPH NODES: No lymphadenopathy. REPRODUCTIVE ORGANS: No acute abnormality. BONES AND SOFT TISSUES: Treated T12 compression deformity containing bone cement is stable from prior study. No focal soft tissue abnormality. IMPRESSION: 1. Stable findings of small cell lung cancer, including multiple unchanged pulmonary nodules. 2. No evidence of acute diverticulitis. Electronically signed by: Waddell Calk MD 08/23/2023 06:24 AM EDT RP Workstation: [id number]     ASSESSMENT/PLAN:  This is a very pleasant 73 year old Caucasian female with extensive stage (T2b, N3, M1 B) small cell lung cancer.   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   She underwent a course of systemic chemotherapy with carboplatin  for an AUC of 5 on day 1, etoposide  100 mg/m on days 1, 2, and 3.  She is status post 4 cycles.  This was concurrent with radiation and treated as limited stage small cell lung cancer due to the limited volume of her disease in the left lung.  He was supposed to start consolidation immunotherapy with  Imfinzi after the induction phase but due to noncompliance he has not been started yet.  The patient was seen with Dr. Sherrod. The patient recently had a restaging CT scan which did not show any evidence of disease progression.Dr. Sherrod offered surveillance. She still wishes to proceed with immunotherapy but would like to reschedule due to her conflict with appointments today.   Therefore the patient will proceed with her first cycle of consolidation immunotherapy next week.   We will see her back for labs and a follow-up visit in 4 weeks before undergoing cycle #2.  The patient is not interested in a Port-A-Cath at this time.  The patient was advised to call immediately if she has any concerning symptoms in the interval. The patient voices understanding of current disease status and treatment options and is in agreement with the current care plan. All questions were answered. The patient knows to call the clinic with any problems, questions or concerns. We can certainly see the patient much sooner if necessary   No orders of the defined types were placed in this encounter.   Cinch Ormond L Savyon Loken, PA-C 08/24/23  ADDENDUM: Hematology/Oncology Attending:  I had a face-to-face encounter with the patient today.  I reviewed her records, lab, scan and recommended her care plan.  This is a 73 years old white  female with extensive stage small cell lung cancer diagnosed in December 2024.  She also has early stage colon adenocarcinoma followed by  surgery.  She underwent SRS to solitary brain metastasis in addition to 4 cycles of systemic chemotherapy with carboplatin  and etoposide  concurrent with radiation to the chest.  She has been noncompliant with her visit and treatment recently.  We decided to repeat CT scan of the chest, abdomen and pelvis to rule out any disease progression before starting her on consolidation treatment with immunotherapy with Imfinzi.  The patient was supposed to start the first dose of her treatment today but again she decided to delay her treatment until next week because of some conflicts. I discussed the scan results with the patient today and that showed no concerning findings for disease progression. We will rearrange for her to start the consolidation treatment with Imfinzi next week and hopefully she will come for her treatment as planned. We will see her back for follow-up visit in around 5 weeks with the start of cycle #2 of this treatment.  For the history of colon cancer, we will need to refer her back to general surgery for discussion of surgical resection. The patient was advised to call immediately if she has any concerning symptoms in the interval. The total time spent in the appointment was 30 minutes including review of chart and various tests results, discussions about plan of care and coordination of care plan . Disclaimer: This note was dictated with voice recognition software. Similar sounding words can inadvertently be transcribed and may be missed upon review. Sherrod MARLA Sherrod, MD

## 2023-08-25 NOTE — Progress Notes (Unsigned)
 Navigator returned pts call about confirming her infusion appt at 1230 on 7/31. Navigator reminded pt that that appt was reviewed and the plan for the pt to get an immunotherapy infusion the same day if her CT results were stable. Pt was aware of this plan, however she notified this navigator that she has an appt at 3pm. The infusion appt is scheduled for 1230, however it is slotted to last for 3.5hrs. I let the pt know I would speak to one of the infusion nurses to confirm that the appt will need to be rescheduled.  Navigator reached out to Ronal Schatz, Charity fundraiser, the infusion room charge nurse and Da'janique Georgina, infusion room RN.  Navigator explained situation and pts appt schedule. Both infusion room nurses confirmed that the pt's infusion app will have to be rescheduled to a later time.  Navigator called pt back to let her know her infusion appt will have to be rescheduled, but that can be done after her follow up appt with Cassie, PA and Dr Sherrod. Pt verbalized understanding.

## 2023-08-26 ENCOUNTER — Encounter: Payer: Self-pay | Admitting: Internal Medicine

## 2023-08-26 ENCOUNTER — Inpatient Hospital Stay

## 2023-08-26 ENCOUNTER — Inpatient Hospital Stay (HOSPITAL_BASED_OUTPATIENT_CLINIC_OR_DEPARTMENT_OTHER): Admitting: Physician Assistant

## 2023-08-26 ENCOUNTER — Telehealth: Payer: Self-pay | Admitting: Internal Medicine

## 2023-08-26 VITALS — BP 138/61 | HR 88 | Temp 98.2°F | Resp 17 | Wt 180.0 lb

## 2023-08-26 DIAGNOSIS — C7931 Secondary malignant neoplasm of brain: Secondary | ICD-10-CM | POA: Diagnosis not present

## 2023-08-26 DIAGNOSIS — Z91199 Patient's noncompliance with other medical treatment and regimen due to unspecified reason: Secondary | ICD-10-CM | POA: Diagnosis not present

## 2023-08-26 DIAGNOSIS — C3412 Malignant neoplasm of upper lobe, left bronchus or lung: Secondary | ICD-10-CM | POA: Diagnosis not present

## 2023-08-26 DIAGNOSIS — F17210 Nicotine dependence, cigarettes, uncomplicated: Secondary | ICD-10-CM | POA: Diagnosis not present

## 2023-08-26 DIAGNOSIS — Z79899 Other long term (current) drug therapy: Secondary | ICD-10-CM | POA: Diagnosis not present

## 2023-08-26 DIAGNOSIS — C189 Malignant neoplasm of colon, unspecified: Secondary | ICD-10-CM | POA: Diagnosis not present

## 2023-08-26 NOTE — Telephone Encounter (Signed)
 Called the patient and confirmed scheduled appointments at [facility].

## 2023-08-27 ENCOUNTER — Other Ambulatory Visit: Payer: Self-pay | Admitting: Physician Assistant

## 2023-08-27 ENCOUNTER — Encounter: Payer: Self-pay | Admitting: Internal Medicine

## 2023-08-27 ENCOUNTER — Telehealth: Payer: Self-pay | Admitting: Radiation Therapy

## 2023-08-27 ENCOUNTER — Other Ambulatory Visit: Payer: Self-pay

## 2023-08-27 DIAGNOSIS — C3412 Malignant neoplasm of upper lobe, left bronchus or lung: Secondary | ICD-10-CM

## 2023-08-27 NOTE — Progress Notes (Signed)
 Pharmacist Chemotherapy Monitoring - Initial Assessment    Anticipated start date: 08/30/23   The following has been reviewed per standard work regarding the patient's treatment regimen: The patient's diagnosis, treatment plan and drug doses, and organ/hematologic function Lab orders and baseline tests specific to treatment regimen  The treatment plan start date, drug sequencing, and pre-medications Prior authorization status  Patient's documented medication list, including drug-drug interaction screen and prescriptions for anti-emetics and supportive care specific to the treatment regimen The drug concentrations, fluid compatibility, administration routes, and timing of the medications to be used The patient's access for treatment and lifetime cumulative dose history, if applicable  The patient's medication allergies and previous infusion related reactions, if applicable   Changes made to treatment plan:  N/A  Follow up needed:  N/A   Jasmine Long, RPH, 08/27/2023  1:56 PM

## 2023-08-27 NOTE — Telephone Encounter (Signed)
 I called Jasmine Long to see if she is ready to get the brain MRI rescheduled. She states that she is still having pain in her back preventing her from laying flat. She is scheduled to begin physical therapy ordered by Dr. Louis on 8/13. Her goal is to complete that and then consider rescheduling the scan. She does not feel she would be able to tolerate at this time.  I will call her back on 8/15 to see when her physical therapy will be completed.   Devere Perch R.T(R)(T) Radiation Special Procedures Lead

## 2023-08-30 ENCOUNTER — Inpatient Hospital Stay

## 2023-08-30 ENCOUNTER — Telehealth: Payer: Self-pay | Admitting: Internal Medicine

## 2023-08-30 ENCOUNTER — Other Ambulatory Visit

## 2023-08-30 ENCOUNTER — Inpatient Hospital Stay: Attending: Hematology

## 2023-08-30 VITALS — BP 153/51 | HR 81 | Temp 97.6°F | Resp 18 | Wt 180.0 lb

## 2023-08-30 DIAGNOSIS — Z5112 Encounter for antineoplastic immunotherapy: Secondary | ICD-10-CM | POA: Diagnosis not present

## 2023-08-30 DIAGNOSIS — Z79620 Long term (current) use of immunosuppressive biologic: Secondary | ICD-10-CM | POA: Insufficient documentation

## 2023-08-30 DIAGNOSIS — C7931 Secondary malignant neoplasm of brain: Secondary | ICD-10-CM | POA: Diagnosis not present

## 2023-08-30 DIAGNOSIS — C3412 Malignant neoplasm of upper lobe, left bronchus or lung: Secondary | ICD-10-CM

## 2023-08-30 DIAGNOSIS — F17210 Nicotine dependence, cigarettes, uncomplicated: Secondary | ICD-10-CM | POA: Insufficient documentation

## 2023-08-30 LAB — CBC WITH DIFFERENTIAL (CANCER CENTER ONLY)
Abs Immature Granulocytes: 0.02 K/uL (ref 0.00–0.07)
Basophils Absolute: 0 K/uL (ref 0.0–0.1)
Basophils Relative: 0 %
Eosinophils Absolute: 0.1 K/uL (ref 0.0–0.5)
Eosinophils Relative: 1 %
HCT: 37 % (ref 36.0–46.0)
Hemoglobin: 12 g/dL (ref 12.0–15.0)
Immature Granulocytes: 0 %
Lymphocytes Relative: 13 %
Lymphs Abs: 0.8 K/uL (ref 0.7–4.0)
MCH: 30.2 pg (ref 26.0–34.0)
MCHC: 32.4 g/dL (ref 30.0–36.0)
MCV: 93 fL (ref 80.0–100.0)
Monocytes Absolute: 0.5 K/uL (ref 0.1–1.0)
Monocytes Relative: 8 %
Neutro Abs: 4.7 K/uL (ref 1.7–7.7)
Neutrophils Relative %: 78 %
Platelet Count: 267 K/uL (ref 150–400)
RBC: 3.98 MIL/uL (ref 3.87–5.11)
RDW: 14 % (ref 11.5–15.5)
WBC Count: 6.1 K/uL (ref 4.0–10.5)
nRBC: 0 % (ref 0.0–0.2)

## 2023-08-30 LAB — CMP (CANCER CENTER ONLY)
ALT: 10 U/L (ref 0–44)
AST: 16 U/L (ref 15–41)
Albumin: 4.4 g/dL (ref 3.5–5.0)
Alkaline Phosphatase: 122 U/L (ref 38–126)
Anion gap: 13 (ref 5–15)
BUN: 17 mg/dL (ref 8–23)
CO2: 23 mmol/L (ref 22–32)
Calcium: 10.2 mg/dL (ref 8.9–10.3)
Chloride: 105 mmol/L (ref 98–111)
Creatinine: 0.98 mg/dL (ref 0.44–1.00)
GFR, Estimated: >60 mL/min (ref >60)
Glucose, Bld: 88 mg/dL (ref 70–99)
Potassium: 4.3 mmol/L (ref 3.5–5.1)
Sodium: 141 mmol/L (ref 135–145)
Total Bilirubin: 0.6 mg/dL (ref 0.0–1.2)
Total Protein: 7.1 g/dL (ref 6.5–8.1)

## 2023-08-30 LAB — TSH: TSH: 4.29 u[IU]/mL (ref 0.350–4.500)

## 2023-08-30 MED ORDER — SODIUM CHLORIDE 0.9 % IV SOLN: INTRAVENOUS | Status: DC

## 2023-08-30 MED ORDER — SODIUM CHLORIDE 0.9 % IV SOLN
1500.0000 mg | Freq: Once | INTRAVENOUS | Status: AC
  Administered 2023-08-30: 1500 mg via INTRAVENOUS
  Filled 2023-08-30: qty 30

## 2023-08-30 NOTE — Patient Instructions (Signed)
 [facility] - A DEPT OF [facility]  Discharge Instructions: Thank you for choosing [facility] to provide your oncology and hematology care.   If you have a lab appointment with the Cancer Center, please go directly to the Cancer Center and check in at the registration area.   Wear comfortable clothing and clothing appropriate for easy access to any Portacath or PICC line.   We strive to give you quality time with your provider. You may need to reschedule your appointment if you arrive late (15 or more minutes).  Arriving late affects you and other patients whose appointments are after yours.  Also, if you miss three or more appointments without notifying the office, you may be dismissed from the clinic at the provider's discretion.      For prescription refill requests, have your pharmacy contact our office and allow 72 hours for refills to be completed.    Today you received the following chemotherapy and/or immunotherapy agents: durvalumab  (imfinzi )     Durvalumab  Injection What is this medication? DURVALUMAB  (dur VAL ue mab) treats some types of cancer. It works by helping your immune system slow or stop the spread of cancer cells. It is a monoclonal antibody. This medicine may be used for other purposes; ask your health care provider or pharmacist if you have questions. COMMON BRAND NAME(S): IMFINZI  What should I tell my care team before I take this medication? They need to know if you have any of these conditions: Allogeneic stem cell transplant (uses someone else's stem cells) Autoimmune diseases, such as Crohn disease, ulcerative colitis, lupus History of chest radiation Nervous system problems, such as Guillain-Barre syndrome, myasthenia gravis Organ transplant An unusual or allergic reaction to durvalumab , other medications, foods, dyes, or preservatives Pregnant or trying to get pregnant Breast-feeding How should I use this  medication? This medication is infused into a vein. It is given by your care team in a hospital or clinic setting. A special MedGuide will be given to you before each treatment. Be sure to read this information carefully each time. Talk to your care team about the use of this medication in children. Special care may be needed. Overdosage: If you think you have taken too much of this medicine contact a poison control center or emergency room at once. NOTE: This medicine is only for you. Do not share this medicine with others. What if I miss a dose? Keep appointments for follow-up doses. It is important not to miss your dose. Call your care team if you are unable to keep an appointment. What may interact with this medication? Interactions have not been studied. This list may not describe all possible interactions. Give your health care provider a list of all the medicines, herbs, non-prescription drugs, or dietary supplements you use. Also tell them if you smoke, drink alcohol, or use illegal drugs. Some items may interact with your medicine. What should I watch for while using this medication? Your condition will be monitored carefully while you are receiving this medication. You may need blood work while taking this medication. This medication may cause serious skin reactions. They can happen weeks to months after starting the medication. Contact your care team right away if you notice fevers or flu-like symptoms with a rash. The rash may be red or purple and then turn into blisters or peeling of the skin. You may also notice a red rash with swelling of the face, lips, or lymph nodes  in your neck or under your arms. Tell your care team right away if you have any change in your eyesight. Talk to your care team if you may be pregnant. Serious birth defects can occur if you take this medication during pregnancy and for 3 months after the last dose. You will need a negative pregnancy test before starting  this medication. Contraception is recommended while taking this medication and for 3 months after the last dose. Your care team can help you find the option that works for you. Do not breastfeed while taking this medication and for 3 months after the last dose. What side effects may I notice from receiving this medication? Side effects that you should report to your care team as soon as possible: Allergic reactions--skin rash, itching, hives, swelling of the face, lips, tongue, or throat Dry cough, shortness of breath or trouble breathing Eye pain, redness, irritation, or discharge with blurry or decreased vision Heart muscle inflammation--unusual weakness or fatigue, shortness of breath, chest pain, fast or irregular heartbeat, dizziness, swelling of the ankles, feet, or hands Hormone gland problems--headache, sensitivity to light, unusual weakness or fatigue, dizziness, fast or irregular heartbeat, increased sensitivity to cold or heat, excessive sweating, constipation, hair loss, increased thirst or amount of urine, tremors or shaking, irritability Infusion reactions--chest pain, shortness of breath or trouble breathing, feeling faint or lightheaded Kidney injury (glomerulonephritis)--decrease in the amount of urine, red or dark brown urine, foamy or bubbly urine, swelling of the ankles, hands, or feet Liver injury--right upper belly pain, loss of appetite, nausea, light-colored stool, dark yellow or brown urine, yellowing skin or eyes, unusual weakness or fatigue Pain, tingling, or numbness in the hands or feet, muscle weakness, change in vision, confusion or trouble speaking, loss of balance or coordination, trouble walking, seizures Rash, fever, and swollen lymph nodes Redness, blistering, peeling, or loosening of the skin, including inside the mouth Sudden or severe stomach pain, bloody diarrhea, fever, nausea, vomiting Side effects that usually do not require medical attention (report these  to your care team if they continue or are bothersome): Bone, joint, or muscle pain Diarrhea Fatigue Loss of appetite Nausea Skin rash This list may not describe all possible side effects. Call your doctor for medical advice about side effects. You may report side effects to FDA at 1-800-FDA-1088. Where should I keep my medication? This medication is given in a hospital or clinic. It will not be stored at home. NOTE: This sheet is a summary. It may not cover all possible information. If you have questions about this medicine, talk to your doctor, pharmacist, or health care provider.  2024 Elsevier/Gold Standard (2021-05-27 00:00:00)   To help prevent nausea and vomiting after your treatment, we encourage you to take your nausea medication as directed.  BELOW ARE SYMPTOMS THAT SHOULD BE REPORTED IMMEDIATELY: *FEVER GREATER THAN 100.4 F (38 C) OR HIGHER *CHILLS OR SWEATING *NAUSEA AND VOMITING THAT IS NOT CONTROLLED WITH YOUR NAUSEA MEDICATION *UNUSUAL SHORTNESS OF BREATH *UNUSUAL BRUISING OR BLEEDING *URINARY PROBLEMS (pain or burning when urinating, or frequent urination) *BOWEL PROBLEMS (unusual diarrhea, constipation, pain near the anus) TENDERNESS IN MOUTH AND THROAT WITH OR WITHOUT PRESENCE OF ULCERS (sore throat, sores in mouth, or a toothache) UNUSUAL RASH, SWELLING OR PAIN  UNUSUAL VAGINAL DISCHARGE OR ITCHING   Items with * indicate a potential emergency and should be followed up as soon as possible or go to the Emergency Department if any problems should occur.  Please show the CHEMOTHERAPY ALERT  CARD or IMMUNOTHERAPY ALERT CARD at check-in to the Emergency Department and triage nurse.  Should you have questions after your visit or need to cancel or reschedule your appointment, please contact [facility]  Dept: [phone]  and follow the prompts.  Office hours are 8:00 a.m. to 4:30 p.m. Monday - Friday. Please note  that voicemails left after 4:00 p.m. may not be returned until the following business day.  We are closed weekends and major holidays. You have access to a nurse at all times for urgent questions. Please call the main number to the clinic Dept: [phone] and follow the prompts.   For any non-urgent questions, you may also contact your provider using MyChart. We now offer e-Visits for anyone 48 and older to request care online for non-urgent symptoms. For details visit [email or web address].   Also download the MyChart app! Go to the app store, search MyChart, open the app, select [place], and log in with your MyChart username and password.

## 2023-08-31 LAB — T4: T4, Total: 7.3 ug/dL (ref 4.5–12.0)

## 2023-09-01 ENCOUNTER — Other Ambulatory Visit: Payer: Self-pay | Admitting: Emergency Medicine

## 2023-09-01 DIAGNOSIS — F418 Other specified anxiety disorders: Secondary | ICD-10-CM

## 2023-09-01 DIAGNOSIS — F4323 Adjustment disorder with mixed anxiety and depressed mood: Secondary | ICD-10-CM

## 2023-09-06 ENCOUNTER — Ambulatory Visit (INDEPENDENT_AMBULATORY_CARE_PROVIDER_SITE_OTHER): Payer: Medicare HMO

## 2023-09-06 VITALS — Ht 69.0 in | Wt 175.0 lb

## 2023-09-06 DIAGNOSIS — Z0000 Encounter for general adult medical examination without abnormal findings: Secondary | ICD-10-CM | POA: Diagnosis not present

## 2023-09-06 NOTE — Progress Notes (Signed)
 Subjective:   Jasmine Long is a 73 y.o. who presents for a Medicare Wellness preventive visit.  As a reminder, Annual Wellness Visits don't include a physical exam, and some assessments may be limited, especially if this visit is performed virtually. We may recommend an in-person follow-up visit with your provider if needed.  Visit Complete: Virtual I connected with  Jasmine Long on 09/06/23 by a audio enabled telemedicine application and verified that I am speaking with the correct person using two identifiers.  Patient Location: Home  Provider Location: Office/Clinic  I discussed the limitations of evaluation and management by telemedicine. The patient expressed understanding and agreed to proceed.  Vital Signs: Because this visit was a virtual/telehealth visit, some criteria may be missing or patient reported. Any vitals not documented were not able to be obtained and vitals that have been documented are patient reported.  VideoDeclined- This patient declined Librarian, academic. Therefore the visit was completed with audio only.  Persons Participating in Visit: Patient.  AWV Questionnaire: No: Patient Medicare AWV questionnaire was not completed prior to this visit.  Cardiac Risk Factors include: advanced age (>91men, >35 women);diabetes mellitus;dyslipidemia;hypertension     Objective:    Today's Vitals   09/06/23 1049  Weight: 175 lb (79.4 kg)  Height: 5' 9 (1.753 m)   Body mass index is 25.84 kg/m.     09/06/2023   10:49 AM 08/10/2023    4:46 PM 07/20/2023    7:42 AM 01/26/2023    7:51 AM 01/12/2023    9:32 AM 12/31/2022   11:30 AM 10/30/2022    9:55 AM  Advanced Directives  Does Patient Have a Medical Advance Directive? No No No No No No No  Would patient like information on creating a medical advance directive? No - Patient declined  No - Patient declined No - Patient declined No - Patient declined No - Patient declined No - Patient  declined    Current Medications (verified) Outpatient Encounter Medications as of 09/06/2023  Medication Sig   ALPRAZolam  (XANAX ) 0.5 MG tablet Take 1 tablet by mouth twice daily as needed for anxiety   atorvastatin  (LIPITOR) 20 MG tablet Take 1 tablet by mouth once daily   buPROPion  (WELLBUTRIN  XL) 300 MG 24 hr tablet Take 1 tablet by mouth once daily   busPIRone  (BUSPAR ) 7.5 MG tablet Take 1 tablet (7.5 mg total) by mouth 2 (two) times daily.   clonazePAM  (KLONOPIN ) 1 MG tablet Take 1 mg by mouth daily as needed for anxiety (Sleep).   escitalopram  (LEXAPRO ) 10 MG tablet Take 1 tablet (10 mg total) by mouth daily.   gabapentin (NEURONTIN) 300 MG capsule Take 300 mg by mouth daily as needed (numbness).   levothyroxine  (SYNTHROID ) 125 MCG tablet TAKE 1 TABLET BY MOUTH ONCE DAILY BEFORE BREAKFAST   lidocaine -prilocaine  (EMLA ) cream Apply 1 Application topically as needed.   metFORMIN  (GLUCOPHAGE ) 1000 MG tablet TAKE 1 TABLET BY MOUTH TWICE DAILY WITH A MEAL   metoprolol  succinate (TOPROL -XL) 25 MG 24 hr tablet Take 1 tablet by mouth once daily   ondansetron  (ZOFRAN ) 8 MG tablet Take 1 tablet (8 mg total) by mouth every 8 (eight) hours as needed for nausea or vomiting.   sodium chloride  (OCEAN) 0.65 % SOLN nasal spray Place 1 spray into both nostrils daily as needed (Sinus).   traMADol  (ULTRAM ) 50 MG tablet Take 1 tablet (50 mg total) by mouth every 6 (six) hours as needed.   valsartan -hydrochlorothiazide  (DIOVAN -HCT) 160-12.5  MG tablet Take 1 tablet by mouth once daily   cephALEXin  (KEFLEX ) 500 MG capsule Take 1 capsule (500 mg total) by mouth 4 (four) times daily. (Patient not taking: Reported on 09/06/2023)   lidocaine  (LIDODERM ) 5 % Place 1 patch onto the skin daily. Remove & Discard patch within 12 hours or as directed by MD (Patient not taking: Reported on 09/06/2023)   nicotine  (NICODERM CQ ) 21 mg/24hr patch Place 1 patch (21 mg total) onto the skin daily. (Patient not taking: Reported on  09/06/2023)   pantoprazole  (PROTONIX ) 40 MG tablet Take 1 tablet twice a day for 2 weeks and then daily after that (Patient not taking: Reported on 09/06/2023)   No facility-administered encounter medications on file as of 09/06/2023.    Allergies (verified) Other   History: Past Medical History:  Diagnosis Date   Allergy    Anxiety    Cancer (HCC)    small cell lung cancer   Complication of anesthesia    per patient she was hard to wake up   Depression    Diabetes mellitus without complication (HCC)    type 2, does not check blood sugar   Heart murmur    never has caused any problems   Hypertension    Hypothyroidism    Neuromuscular disorder (HCC)    bilateral lower extremity - numbness   Pneumonia    as a child   Thyroid  disease    Ulcer    Past Surgical History:  Procedure Laterality Date   BREAST SURGERY Right    lumpectomy- benign   CESAREAN SECTION  1985   CHOLECYSTECTOMY  1980   open   COLONOSCOPY WITH PROPOFOL  N/A 10/30/2022   Procedure: COLONOSCOPY WITH PROPOFOL ;  Surgeon: Rollin Dover, MD;  Location: [facility];  Service: Gastroenterology;  Laterality: N/A;   ENDOSCOPIC MUCOSAL RESECTION  10/30/2022   Procedure: ENDOSCOPIC MUCOSAL RESECTION;  Surgeon: Rollin Dover, MD;  Location: [facility];  Service: Gastroenterology;;   HEMOSTASIS CLIP PLACEMENT  10/30/2022   Procedure: HEMOSTASIS CLIP PLACEMENT;  Surgeon: Rollin Dover, MD;  Location: [facility];  Service: Gastroenterology;;   KYPHOPLASTY N/A 07/20/2023   Procedure: KYPHOPLASTY THORACIC TWELVE;  Surgeon: Louis Shove, MD;  Location: [facility];  Service: Neurosurgery;  Laterality: N/A;   NASAL SEPTUM SURGERY     POLYPECTOMY  10/30/2022   Procedure: POLYPECTOMY;  Surgeon: Rollin Dover, MD;  Location: [facility];  Service: Gastroenterology;;   ROBLEY LIFTING INJECTION  10/30/2022   Procedure: SUBMUCOSAL LIFTING INJECTION;  Surgeon: Rollin Dover, MD;  Location: [facility];  Service:  Gastroenterology;;   Family History  Problem Relation Age of Onset   Lung cancer Mother 37   Dementia Father    Atrial fibrillation Father    Cancer Paternal Aunt        breast cancer   Colon cancer Paternal Aunt    Colon cancer Cousin    Social History   Socioeconomic History   Marital status: Divorced    Spouse name: Not on file   Number of children: 2   Years of education: Not on file   Highest education level: Bachelor's degree (e.g., BA, AB, BS)  Occupational History   Occupation: Unemployed  Tobacco Use   Smoking status: Former    Current packs/day: 0.00    Average packs/day: 1.5 packs/day for 40.0 years (60.0 ttl pk-yrs)    Types: Cigarettes    Start date: 01/20/1972    Quit date: 01/20/2012    Years since quitting: 11.6  Smokeless tobacco: Never   Tobacco comments:    Hx of E-cigarette use as of 01/20/12.  No longer using E-cig per pt as of 07/15/23.  Vaping Use   Vaping status: Former   Substances: Nicotine , Flavoring  Substance and Sexual Activity   Alcohol use: No   Drug use: No   Sexual activity: Not Currently    Birth control/protection: Post-menopausal  Other Topics Concern   Not on file  Social History Narrative   Patient on worker's compensation for an injury sustained on the job 5 years ago.      Right handed and Left Handed       Lives in a two story home       One son deceased.    Social Drivers of Corporate investment banker Strain: Low Risk  (09/06/2023)   Overall Financial Resource Strain (CARDIA)    Difficulty of Paying Living Expenses: Not hard at all  Food Insecurity: No Food Insecurity (09/06/2023)   Hunger Vital Sign    Worried About Running Out of Food in the Last Year: Never true    Ran Out of Food in the Last Year: Never true  Transportation Needs: No Transportation Needs (09/06/2023)   PRAPARE - Administrator, Civil Service (Medical): No    Lack of Transportation (Non-Medical): No  Physical Activity: Inactive  (09/06/2023)   Exercise Vital Sign    Days of Exercise per Week: 0 days    Minutes of Exercise per Session: 0 min  Stress: Stress Concern Present (09/06/2023)   [employer] of Occupational Health - Occupational Stress Questionnaire    Feeling of Stress: Very much  Social Connections: Socially Isolated (09/06/2023)   Social Connection and Isolation Panel    Frequency of Communication with Friends and Family: Twice a week    Frequency of Social Gatherings with Friends and Family: Never    Attends Religious Services: Never    Database administrator or Organizations: No    Attends Banker Meetings: Never    Marital Status: Divorced    Tobacco Counseling Counseling given: No Tobacco comments: Hx of E-cigarette use as of 01/20/12.  No longer using E-cig per pt as of 07/15/23.    Clinical Intake:  Pre-visit preparation completed: Yes  Pain : No/denies pain     BMI - recorded: 25.84 Nutritional Status: BMI 25 -29 Overweight Nutritional Risks: None Diabetes: No  Lab Results  Component Value Date   HGBA1C 5.8 (H) 09/11/2022   HGBA1C 6.6 (H) 05/13/2022   HGBA1C 6.1 (A) 09/08/2021     How often do you need to have someone help you when you read instructions, pamphlets, or other written materials from your doctor or pharmacy?: 1 - Never  Interpreter Needed?: No  Information entered by :: Jasmine Long, Jasmine Long   Activities of Daily Living     09/06/2023   10:52 AM 07/20/2023    7:33 AM  In your present state of health, do you have any difficulty performing the following activities:  Hearing? 0 0  Vision? 0 0  Difficulty concentrating or making decisions? 0 1  Comment  Sometimes  Walking or climbing stairs? 1   Comment cane   Dressing or bathing? 0   Doing errands, shopping? 0 0  Preparing Food and eating ? N   Using the Toilet? N   In the past six months, have you accidently leaked urine? N   Do you have problems with loss of  bowel control? N   Managing  your Medications? N   Managing your Finances? N   Housekeeping or managing your Housekeeping? N     Patient Care Team: [name], MD as PCP - General (Internal Medicine) Patel, Donika K, DO as Consulting Physician (Neurology) Lanny Callander, MD as Consulting Physician (Hematology and Oncology) Prentis Duwaine BROCKS, RN as Oncology Nurse Navigator Patrcia Cough, MD as Consulting Physician (Radiation Oncology)  I have updated your Care Teams any recent Medical Services you may have received from other providers in the past year.     Assessment:   This is a routine wellness examination for Jasmine Long.  Hearing/Vision screen Hearing Screening - Comments:: Denies hearing difficulties   Vision Screening - Comments:: Denies vision concerns   Goals Addressed               This Visit's Progress     Patient Stated (pt-stated)        Patient stated she plans to continue stay alive and manage Cancer       Depression Screen     09/06/2023   10:58 AM 08/30/2023    9:00 AM 05/03/2023    1:21 PM 03/16/2023    3:08 PM 02/15/2023    3:35 PM 02/04/2023    1:27 PM 01/12/2023    9:50 AM  PHQ 2/9 Scores  PHQ - 2 Score 3 0 0 0 0 0 0  PHQ- 9 Score 5          Fall Risk     09/06/2023   10:53 AM 05/03/2023    1:21 PM 03/16/2023    3:08 PM 02/15/2023    3:35 PM 02/04/2023    1:26 PM  Fall Risk   Falls in the past year? 1 0 0 0 0  Number falls in past yr: 0 0 0 0 0  Comment 1      Injury with Fall? 1 0 0 0 0  Comment fracture in back      Risk for fall due to : Impaired balance/gait No Fall Risks No Fall Risks No Fall Risks No Fall Risks  Follow up Falls evaluation completed;Falls prevention discussed Falls evaluation completed Falls evaluation completed Falls evaluation completed Falls evaluation completed    MEDICARE RISK AT HOME:  Medicare Risk at Home Any stairs in or around the home?: No If so, are there any without handrails?: No Home free of loose throw rugs in walkways, pet beds,  electrical cords, etc?: Yes Adequate lighting in your home to reduce risk of falls?: Yes Life alert?: No Use of a cane, walker or w/c?: Yes (cane) Grab bars in the bathroom?: No Shower chair or bench in shower?: Yes Elevated toilet seat or a handicapped toilet?: No  TIMED UP AND GO:  Was the test performed?  No  Cognitive Function: 6CIT completed        09/06/2023   10:57 AM 09/03/2022    9:47 AM 05/26/2019   10:21 AM 05/23/2018   10:08 AM  6CIT Screen  What Year? 0 points 0 points 0 points 0 points  What month? 0 points 0 points 0 points 0 points  What time? 0 points 0 points 0 points 0 points  Count back from 20 0 points 0 points 0 points 0 points  Months in reverse 0 points 0 points 0 points 0 points  Repeat phrase 0 points 0 points 0 points 0 points  Total Score 0 points 0 points 0 points 0  points    Immunizations There is no immunization history for the selected administration types on file for this patient.  Screening Tests Health Maintenance  Topic Date Due   COVID-19 Vaccine (1) Never done   OPHTHALMOLOGY EXAM  Never done   Zoster Vaccines- Shingrix (1 of 2) Never done   MAMMOGRAM  Never done   DEXA SCAN  Never done   Diabetic kidney evaluation - Urine ACR  06/29/2018   FOOT EXAM  08/09/2020   HEMOGLOBIN A1C  03/14/2023   INFLUENZA VACCINE  08/27/2023   Pneumococcal Vaccine: 50+ Years (1 of 2 - PCV) 01/19/2024 (Originally 04/29/1969)   Diabetic kidney evaluation - eGFR measurement  08/29/2024   Medicare Annual Wellness (AWV)  09/05/2024   Colonoscopy  10/29/2025   Hepatitis C Screening  Completed   Hepatitis B Vaccines  Aged Out   HPV VACCINES  Aged Out   Meningococcal B Vaccine  Aged Out   Lung Cancer Screening  Discontinued   DTaP/Tdap/Td  Discontinued    Health Maintenance  Health Maintenance Due  Topic Date Due   COVID-19 Vaccine (1) Never done   OPHTHALMOLOGY EXAM  Never done   Zoster Vaccines- Shingrix (1 of 2) Never done   MAMMOGRAM  Never done    DEXA SCAN  Never done   Diabetic kidney evaluation - Urine ACR  06/29/2018   FOOT EXAM  08/09/2020   HEMOGLOBIN A1C  03/14/2023   INFLUENZA VACCINE  08/27/2023   Health Maintenance Items Addressed:  I have recommended that this patient have a mammogram, immunization for Shingles, and Mammogram and DEXA scan but she declines at this time. I have discussed the risks and benefits of this procedure with her. The patient verbalizes understanding.   Additional Screening:  Vision Screening: Recommended annual ophthalmology exams for early detection of glaucoma and other disorders of the eye. Would you like a referral to an eye doctor? No    Dental Screening: Recommended annual dental exams for proper oral hygiene  Community Resource Referral / Chronic Care Management: CRR required this visit?  No   CCM required this visit?  No   Plan:    I have personally reviewed and noted the following in the patient's chart:   Medical and social history Use of alcohol, tobacco or illicit drugs  Current medications and supplements including opioid prescriptions. Patient is currently taking opioid prescriptions. Information provided to patient regarding non-opioid alternatives. Patient advised to discuss non-opioid treatment plan with their provider. Functional ability and status Nutritional status Physical activity Advanced directives List of other physicians Hospitalizations, surgeries, and ER visits in previous 12 months Vitals Screenings to include cognitive, depression, and falls Referrals and appointments  In addition, I have reviewed and discussed with patient certain preventive protocols, quality metrics, and best practice recommendations. A written personalized care plan for preventive services as well as general preventive health recommendations were provided to patient.   Jasmine Jasmine Long, Jasmine Long   09/06/2023   After Visit Summary: (MyChart) Due to this being a telephonic visit, the  after visit summary with patients personalized plan was offered to patient via MyChart   Notes: Please refer to Routing Comments. Patient has declined to have a Mammogram, and DEXA scan.  Pt had a CT Scan of chest, abdomen, and pelvis on 05/14/2023.   Pt is currently seeing an Architectural technologist.  Will be starting Physical Therapy soon due to lumbar fracture from fall.

## 2023-09-06 NOTE — Patient Instructions (Addendum)
 Ms. Flaim , Thank you for taking time out of your busy schedule to complete your Annual Wellness Visit with me. I enjoyed our conversation and look forward to speaking with you again next year. I, as well as your care team,  appreciate your ongoing commitment to your health goals. Please review the following plan we discussed and let me know if I can assist you in the future. Your Game plan/ To Do List    Referrals: If you haven't heard from the office you've been referred to, please reach out to them at the phone provided.   Follow up Visits: We will see or speak with you next year for your Next Medicare AWV with our clinical staff Have you seen your provider in the last 6 months (3 months if uncontrolled diabetes)? No  Clinician Recommendations:  Aim for 30 minutes of exercise or brisk walking, 6-8 glasses of water, and 5 servings of fruits and vegetables each day. Educated and advised on getting a Mammogram, DEXA scan, and Shingles vaccines in 2025.      This is a list of the screenings recommended for you:  Health Maintenance  Topic Date Due   COVID-19 Vaccine (1) Never done   Eye exam for diabetics  Never done   Zoster (Shingles) Vaccine (1 of 2) Never done   Mammogram  Never done   DEXA scan (bone density measurement)  Never done   Yearly kidney health urinalysis for diabetes  06/29/2018   Complete foot exam   08/09/2020   Hemoglobin A1C  03/14/2023   Flu Shot  08/27/2023   Pneumococcal Vaccine for age over 67 (1 of 2 - PCV) 01/19/2024*   Yearly kidney function blood test for diabetes  08/29/2024   Medicare Annual Wellness Visit  09/05/2024   Colon Cancer Screening  10/29/2025   Hepatitis C Screening  Completed   Hepatitis B Vaccine  Aged Out   HPV Vaccine  Aged Out   Meningitis B Vaccine  Aged Out   Screening for Lung Cancer  Discontinued   DTaP/Tdap/Td vaccine  Discontinued  *Topic was postponed. The date shown is not the original due date.    Advanced directives:  (Declined) Advance directive discussed with you today. Even though you declined this today, please call our office should you change your mind, and we can give you the proper paperwork for you to fill out. Advance Care Planning is important because it:  [x]  Makes sure you receive the medical care that is consistent with your values, goals, and preferences  [x]  It provides guidance to your family and loved ones and reduces their decisional burden about whether or not they are making the right decisions based on your wishes.  Follow the link provided in your after visit summary or read over the paperwork we have mailed to you to help you started getting your Advance Directives in place. If you need assistance in completing these, please reach out to us  so that we can help you!   Managing Pain Without Opioids Opioids are strong medicines used to treat moderate to severe pain. For some people, especially those who have long-term (chronic) pain, opioids may not be the best choice for pain management due to: Side effects like nausea, constipation, and sleepiness. The risk of addiction (opioid use disorder). The longer you take opioids, the greater your risk of addiction. Pain that lasts for more than 3 months is called chronic pain. Managing chronic pain usually requires more than one approach and  is often provided by a team of health care providers working together (multidisciplinary approach). Pain management may be done at a pain management center or pain clinic. How to manage pain without the use of opioids Use non-opioid medicines Non-opioid medicines for pain may include: Over-the-counter or prescription non-steroidal anti-inflammatory drugs (NSAIDs). These may be the first medicines used for pain. They work well for muscle and bone pain, and they reduce swelling. Acetaminophen . This over-the-counter medicine may work well for milder pain but not swelling. Antidepressants. These may be used to  treat chronic pain. A certain type of antidepressant (tricyclics) is often used. These medicines are given in lower doses for pain than when used for depression. Anticonvulsants. These are usually used to treat seizures but may also reduce nerve (neuropathic) pain. Muscle relaxants. These relieve pain caused by sudden muscle tightening (spasms). You may also use a pain medicine that is applied to the skin as a patch, cream, or gel (topical analgesic), such as a numbing medicine. These may cause fewer side effects than medicines taken by mouth. Do certain therapies as directed Some therapies can help with pain management. They include: Physical therapy. You will do exercises to gain strength and flexibility. A physical therapist may teach you exercises to move and stretch parts of your body that are weak, stiff, or painful. You can learn these exercises at physical therapy visits and practice them at home. Physical therapy may also involve: Massage. Heat wraps or applying heat or cold to affected areas. Electrical signals that interrupt pain signals (transcutaneous electrical nerve stimulation, TENS). Weak lasers that reduce pain and swelling (low-level laser therapy). Signals from your body that help you learn to regulate pain (biofeedback). Occupational therapy. This helps you to learn ways to function at home and work with less pain. Recreational therapy. This involves trying new activities or hobbies, such as a physical activity or drawing. Mental health therapy, including: Cognitive behavioral therapy (CBT). This helps you learn coping skills for dealing with pain. Acceptance and commitment therapy (ACT) to change the way you think and react to pain. Relaxation therapies, including muscle relaxation exercises and mindfulness-based stress reduction. Pain management counseling. This may be individual, family, or group counseling.  Receive medical treatments Medical treatments for pain  management include: Nerve block injections. These may include a pain blocker and anti-inflammatory medicines. You may have injections: Near the spine to relieve chronic back or neck pain. Into joints to relieve back or joint pain. Into nerve areas that supply a painful area to relieve body pain. Into muscles (trigger point injections) to relieve some painful muscle conditions. A medical device placed near your spine to help block pain signals and relieve nerve pain or chronic back pain (spinal cord stimulation device). Acupuncture. Follow these instructions at home Medicines Take over-the-counter and prescription medicines only as told by your health care provider. If you are taking pain medicine, ask your health care providers about possible side effects to watch out for. Do not drive or use heavy machinery while taking prescription opioid pain medicine. Lifestyle  Do not use drugs or alcohol to reduce pain. If you drink alcohol, limit how much you have to: 0-1 drink a day for women who are not pregnant. 0-2 drinks a day for men. Know how much alcohol is in a drink. In the [place] one drink equals one 12 oz bottle of beer (355 mL), one 5 oz glass of wine (148 mL), or one 1 oz glass of hard liquor (44 mL). Do  not use any products that contain nicotine  or tobacco. These products include cigarettes, chewing tobacco, and vaping devices, such as e-cigarettes. If you need help quitting, ask your health care provider. Eat a healthy diet and maintain a healthy weight. Poor diet and excess weight may make pain worse. Eat foods that are high in fiber. These include fresh fruits and vegetables, whole grains, and beans. Limit foods that are high in fat and processed sugars, such as fried and sweet foods. Exercise regularly. Exercise lowers stress and may help relieve pain. Ask your health care provider what activities and exercises are safe for you. If your health care provider approves, join an  exercise class that combines movement and stress reduction. Examples include yoga and tai chi. Get enough sleep. Lack of sleep may make pain worse. Lower stress as much as possible. Practice stress reduction techniques as told by your therapist. General instructions Work with all your pain management providers to find the treatments that work best for you. You are an important member of your pain management team. There are many things you can do to reduce pain on your own. Consider joining an online or in-person support group for people who have chronic pain. Keep all follow-up visits. This is important. Where to find more information You can find more information about managing pain without opioids from: American Academy of Pain Medicine: painmed.org Institute for Chronic Pain: instituteforchronicpain.org American Chronic Pain Association: theacpa.org Contact a health care provider if: You have side effects from pain medicine. Your pain gets worse or does not get better with treatments or home therapy. You are struggling with anxiety or depression. Summary Many types of pain can be managed without opioids. Chronic pain may respond better to pain management without opioids. Pain is best managed when you and a team of health care providers work together. Pain management without opioids may include non-opioid medicines, medical treatments, physical therapy, mental health therapy, and lifestyle changes. Tell your health care providers if your pain gets worse or is not being managed well enough. This information is not intended to replace advice given to you by your health care provider. Make sure you discuss any questions you have with your health care provider. Document Revised: 04/24/2020 Document Reviewed: 04/24/2020 Elsevier Patient Education  2024 [employer].

## 2023-09-07 ENCOUNTER — Other Ambulatory Visit: Payer: Self-pay

## 2023-09-08 DIAGNOSIS — M546 Pain in thoracic spine: Secondary | ICD-10-CM | POA: Diagnosis not present

## 2023-09-13 ENCOUNTER — Ambulatory Visit: Admitting: Physician Assistant

## 2023-09-13 ENCOUNTER — Other Ambulatory Visit

## 2023-09-13 ENCOUNTER — Ambulatory Visit

## 2023-09-15 DIAGNOSIS — M546 Pain in thoracic spine: Secondary | ICD-10-CM | POA: Diagnosis not present

## 2023-09-16 DIAGNOSIS — Z6826 Body mass index (BMI) 26.0-26.9, adult: Secondary | ICD-10-CM | POA: Diagnosis not present

## 2023-09-16 DIAGNOSIS — S22080A Wedge compression fracture of T11-T12 vertebra, initial encounter for closed fracture: Secondary | ICD-10-CM | POA: Diagnosis not present

## 2023-09-17 DIAGNOSIS — M546 Pain in thoracic spine: Secondary | ICD-10-CM | POA: Diagnosis not present

## 2023-09-28 DIAGNOSIS — M546 Pain in thoracic spine: Secondary | ICD-10-CM | POA: Diagnosis not present

## 2023-09-30 ENCOUNTER — Inpatient Hospital Stay

## 2023-09-30 ENCOUNTER — Inpatient Hospital Stay: Admitting: Internal Medicine

## 2023-09-30 ENCOUNTER — Inpatient Hospital Stay: Attending: Hematology

## 2023-09-30 VITALS — BP 142/59 | HR 84 | Temp 97.3°F | Resp 16 | Ht 69.0 in | Wt 183.0 lb

## 2023-09-30 DIAGNOSIS — C3400 Malignant neoplasm of unspecified main bronchus: Secondary | ICD-10-CM

## 2023-09-30 DIAGNOSIS — Z5112 Encounter for antineoplastic immunotherapy: Secondary | ICD-10-CM | POA: Insufficient documentation

## 2023-09-30 DIAGNOSIS — F17210 Nicotine dependence, cigarettes, uncomplicated: Secondary | ICD-10-CM | POA: Insufficient documentation

## 2023-09-30 DIAGNOSIS — C189 Malignant neoplasm of colon, unspecified: Secondary | ICD-10-CM | POA: Diagnosis not present

## 2023-09-30 DIAGNOSIS — C180 Malignant neoplasm of cecum: Secondary | ICD-10-CM

## 2023-09-30 DIAGNOSIS — C7931 Secondary malignant neoplasm of brain: Secondary | ICD-10-CM | POA: Diagnosis not present

## 2023-09-30 DIAGNOSIS — C3412 Malignant neoplasm of upper lobe, left bronchus or lung: Secondary | ICD-10-CM

## 2023-09-30 DIAGNOSIS — Z79899 Other long term (current) drug therapy: Secondary | ICD-10-CM | POA: Diagnosis not present

## 2023-09-30 LAB — CBC WITH DIFFERENTIAL (CANCER CENTER ONLY)
Abs Immature Granulocytes: 0 K/uL (ref 0.00–0.07)
Basophils Absolute: 0 K/uL (ref 0.0–0.1)
Basophils Relative: 0 %
Eosinophils Absolute: 0.1 K/uL (ref 0.0–0.5)
Eosinophils Relative: 2 %
HCT: 36.6 % (ref 36.0–46.0)
Hemoglobin: 11.9 g/dL — ABNORMAL LOW (ref 12.0–15.0)
Immature Granulocytes: 0 %
Lymphocytes Relative: 18 %
Lymphs Abs: 0.9 K/uL (ref 0.7–4.0)
MCH: 28.8 pg (ref 26.0–34.0)
MCHC: 32.5 g/dL (ref 30.0–36.0)
MCV: 88.6 fL (ref 80.0–100.0)
Monocytes Absolute: 0.6 K/uL (ref 0.1–1.0)
Monocytes Relative: 11 %
Neutro Abs: 3.6 K/uL (ref 1.7–7.7)
Neutrophils Relative %: 69 %
Platelet Count: 277 K/uL (ref 150–400)
RBC: 4.13 MIL/uL (ref 3.87–5.11)
RDW: 14.5 % (ref 11.5–15.5)
WBC Count: 5.2 K/uL (ref 4.0–10.5)
nRBC: 0 % (ref 0.0–0.2)

## 2023-09-30 LAB — CMP (CANCER CENTER ONLY)
ALT: 12 U/L (ref 0–44)
AST: 14 U/L — ABNORMAL LOW (ref 15–41)
Albumin: 4.1 g/dL (ref 3.5–5.0)
Alkaline Phosphatase: 122 U/L (ref 38–126)
Anion gap: 7 (ref 5–15)
BUN: 23 mg/dL (ref 8–23)
CO2: 27 mmol/L (ref 22–32)
Calcium: 9.4 mg/dL (ref 8.9–10.3)
Chloride: 105 mmol/L (ref 98–111)
Creatinine: 0.96 mg/dL (ref 0.44–1.00)
GFR, Estimated: >60 mL/min (ref >60)
Glucose, Bld: 86 mg/dL (ref 70–99)
Potassium: 4.2 mmol/L (ref 3.5–5.1)
Sodium: 139 mmol/L (ref 135–145)
Total Bilirubin: 0.4 mg/dL (ref 0.0–1.2)
Total Protein: 7.4 g/dL (ref 6.5–8.1)

## 2023-09-30 LAB — CEA (ACCESS): CEA (CHCC): 4.07 ng/mL (ref 0.00–5.00)

## 2023-09-30 MED ORDER — SODIUM CHLORIDE 0.9 % IV SOLN
1500.0000 mg | Freq: Once | INTRAVENOUS | Status: AC
  Administered 2023-09-30: 1500 mg via INTRAVENOUS
  Filled 2023-09-30: qty 30

## 2023-09-30 MED ORDER — SODIUM CHLORIDE 0.9 % IV SOLN: INTRAVENOUS | Status: DC

## 2023-09-30 NOTE — Progress Notes (Signed)
 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    PRIOR THERAPY:  1) SRS to solitary brain metastasis under the care of Dr. Patrcia  2) systemic chemotherapy with carboplatin  for an AUC 5 and etoposide  100 mg/m on days 1, 2, and 3 IV every 3 weeks.  Imfinzi  will be added once she completes palliative radiation.  Status post 4 cycles.  This is concurrent with radiotherapy completed on April 05, 2023.  CURRENT THERAPY: Consolidation treatment with immunotherapy with Imfinzi  1500 Mg IV every 4 weeks.  First dose 08/30/2023.  Status post 1 cycle.  INTERVAL HISTORY: Jasmine Long 73 y.o. female returns to the clinic today for follow-up visit.  Discussed the use of AI scribe software for clinical note transcription with the patient, who gave verbal consent to proceed.  History of Present Illness Jasmine Long is a 73 year old female with extensive stage small cell lung cancer and early stage colon adenocarcinoma who presents for evaluation before starting cycle number two of her treatment.  Diagnosed with extensive stage small cell lung cancer and early stage colon adenocarcinoma in December 2024, she is currently undergoing treatment and is here for evaluation before starting the second cycle of her current regimen.  The first round of treatment was well-tolerated with no significant side effects such as nausea, vomiting, diarrhea,  headaches, or rash. She humorously notes that her hair is growing back curly despite expecting it to be straight, and she jokes about suing the pharmacy for this outcome.  She is participating in physical therapy twice a week for her back and has completed three sessions so far. She mentions gaining twelve pounds recently, attributing it to increased appetite and eating habits, and notes that Dr. Emil wants her to stay at 171 pounds.  Regarding her colon cancer, she recalls that during a colonoscopy, the doctor was 'ninety nine point nine percent sure' that the cancerous lesion was completely removed. She has not yet seen a surgeon for further evaluation or treatment related to the colon cancer, as she has been focused on her current chemotherapy regimen.  Her current medication regimen includes MgIV every four weeks, with the last dose administered on August 30, 2023.  No nausea, vomiting, diarrhea, headaches, or rash during her treatment.     MEDICAL HISTORY: Past Medical History:  Diagnosis Date   Allergy    Anxiety    Cancer (HCC)    small cell lung cancer   Complication of anesthesia    per patient she was hard to wake up   Depression    Diabetes mellitus without complication (HCC)    type 2, does not check blood sugar   Heart murmur    never has caused any problems   Hypertension    Hypothyroidism    Neuromuscular disorder (HCC)    bilateral lower extremity - numbness   Pneumonia    as a child   Thyroid  disease  Ulcer     ALLERGIES:  is allergic to other.  MEDICATIONS:  Current Outpatient Medications  Medication Sig Dispense Refill   ALPRAZolam  (XANAX ) 0.5 MG tablet Take 1 tablet by mouth twice daily as needed for anxiety 30 tablet 2   atorvastatin  (LIPITOR) 20 MG tablet Take 1 tablet by mouth once daily 90 tablet 1   buPROPion  (WELLBUTRIN  XL) 300 MG 24 hr tablet Take 1 tablet by mouth once daily 90 tablet 1   busPIRone  (BUSPAR ) 7.5 MG tablet Take 1 tablet (7.5  mg total) by mouth 2 (two) times daily. 60 tablet 1   cephALEXin  (KEFLEX ) 500 MG capsule Take 1 capsule (500 mg total) by mouth 4 (four) times daily. (Patient not taking: Reported on 09/06/2023) 28 capsule 0   clonazePAM  (KLONOPIN ) 1 MG tablet Take 1 mg by mouth daily as needed for anxiety (Sleep).     escitalopram  (LEXAPRO ) 10 MG tablet Take 1 tablet (10 mg total) by mouth daily. 90 tablet 3   gabapentin (NEURONTIN) 300 MG capsule Take 300 mg by mouth daily as needed (numbness).     levothyroxine  (SYNTHROID ) 125 MCG tablet TAKE 1 TABLET BY MOUTH ONCE DAILY BEFORE BREAKFAST 90 tablet 0   lidocaine  (LIDODERM ) 5 % Place 1 patch onto the skin daily. Remove & Discard patch within 12 hours or as directed by Jasmine Long (Patient not taking: Reported on 09/06/2023) 30 patch 0   lidocaine -prilocaine  (EMLA ) cream Apply 1 Application topically as needed. 30 g 2   metFORMIN  (GLUCOPHAGE ) 1000 MG tablet TAKE 1 TABLET BY MOUTH TWICE DAILY WITH A MEAL 180 tablet 1   metoprolol  succinate (TOPROL -XL) 25 MG 24 hr tablet Take 1 tablet by mouth once daily 90 tablet 3   nicotine  (NICODERM CQ ) 21 mg/24hr patch Place 1 patch (21 mg total) onto the skin daily. (Patient not taking: Reported on 09/06/2023) 28 patch 3   ondansetron  (ZOFRAN ) 8 MG tablet Take 1 tablet (8 mg total) by mouth every 8 (eight) hours as needed for nausea or vomiting. 30 tablet 1   pantoprazole  (PROTONIX ) 40 MG tablet Take 1 tablet twice a day for 2 weeks and then daily after that (Patient not taking: Reported on 09/06/2023) 90 tablet 3   sodium chloride  (OCEAN) 0.65 % SOLN nasal spray Place 1 spray into both nostrils daily as needed (Sinus).     traMADol  (ULTRAM ) 50 MG tablet Take 1 tablet (50 mg total) by mouth every 6 (six) hours as needed. 20 tablet 0   valsartan -hydrochlorothiazide  (DIOVAN -HCT) 160-12.5 MG tablet Take 1 tablet by mouth once daily 90 tablet 3   No current facility-administered medications for this visit.    SURGICAL HISTORY:  Past Surgical  History:  Procedure Laterality Date   BREAST SURGERY Right    lumpectomy- benign   CESAREAN SECTION  1985   CHOLECYSTECTOMY  1980   open   COLONOSCOPY WITH PROPOFOL  N/A 10/30/2022   Procedure: COLONOSCOPY WITH PROPOFOL ;  Surgeon: Rollin Dover, Jasmine Long;  Location: [facility];  Service: Gastroenterology;  Laterality: N/A;   ENDOSCOPIC MUCOSAL RESECTION  10/30/2022   Procedure: ENDOSCOPIC MUCOSAL RESECTION;  Surgeon: Rollin Dover, Jasmine Long;  Location: [facility];  Service: Gastroenterology;;   HEMOSTASIS CLIP PLACEMENT  10/30/2022   Procedure: HEMOSTASIS CLIP PLACEMENT;  Surgeon: Rollin Dover, Jasmine Long;  Location: [facility];  Service: Gastroenterology;;   KYPHOPLASTY N/A 07/20/2023   Procedure: KYPHOPLASTY THORACIC TWELVE;  Surgeon: Louis Shove, Jasmine Long;  Location: [facility] OR;  Service: Neurosurgery;  Laterality: N/A;   NASAL SEPTUM SURGERY  POLYPECTOMY  10/30/2022   Procedure: POLYPECTOMY;  Surgeon: Rollin Dover, Jasmine Long;  Location: [facility];  Service: Gastroenterology;;   ROBLEY LIFTING INJECTION  10/30/2022   Procedure: SUBMUCOSAL LIFTING INJECTION;  Surgeon: Rollin Dover, Jasmine Long;  Location: [facility];  Service: Gastroenterology;;    REVIEW OF SYSTEMS:  Constitutional: negative Eyes: negative Ears, nose, mouth, throat, and face: negative Respiratory: negative Cardiovascular: negative Gastrointestinal: negative Genitourinary:negative Integument/breast: negative Hematologic/lymphatic: negative Musculoskeletal:positive for back pain Neurological: negative Behavioral/Psych: negative Endocrine: negative Allergic/Immunologic: negative   PHYSICAL EXAMINATION: General appearance: alert, cooperative, fatigued, and no distress Head: Normocephalic, without obvious abnormality, atraumatic Neck: no adenopathy, no JVD, supple, symmetrical, trachea midline, and thyroid  not enlarged, symmetric, no tenderness/mass/nodules Lymph nodes: Cervical, supraclavicular, and axillary nodes normal. Resp: clear to  auscultation bilaterally Back: symmetric, no curvature. ROM normal. No CVA tenderness. Cardio: regular rate and rhythm, S1, S2 normal, no murmur, click, rub or gallop GI: soft, non-tender; bowel sounds normal; no masses,  no organomegaly Extremities: extremities normal, atraumatic, no cyanosis or edema Neurologic: Alert and oriented X 3, normal strength and tone. Normal symmetric reflexes. Normal coordination and gait  ECOG PERFORMANCE STATUS: 1 - Symptomatic but completely ambulatory  Blood pressure (!) 142/59, pulse 84, temperature (!) 97.3 F (36.3 C), temperature source Temporal, resp. rate 16, height 5' 9 (1.753 m), weight 183 lb (83 kg), SpO2 100%.  LABORATORY DATA: Lab Results  Component Value Date   WBC 5.2 09/30/2023   HGB 11.9 (L) 09/30/2023   HCT 36.6 09/30/2023   MCV 88.6 09/30/2023   PLT 277 09/30/2023      Chemistry      Component Value Date/Time   NA 141 08/30/2023 0842   NA 134 09/11/2022 1259   K 4.3 08/30/2023 0842   CL 105 08/30/2023 0842   CO2 23 08/30/2023 0842   BUN 17 08/30/2023 0842   BUN 12 09/11/2022 1259   CREATININE 0.98 08/30/2023 0842   CREATININE 0.98 10/30/2013 1118      Component Value Date/Time   CALCIUM  10.2 08/30/2023 0842   ALKPHOS 122 08/30/2023 0842   AST 16 08/30/2023 0842   ALT 10 08/30/2023 0842   BILITOT 0.6 08/30/2023 0842       RADIOGRAPHIC STUDIES: No results found.   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  She is currently undergoing systemic chemotherapy with carboplatin  for AUC of 5 on day 1 and etoposide  100 Mg/M2 on days 1, 2 and 3 status post 4 cycles.  This is concurrent with radiotherapy treated as limited stage disease because of the  limited volume of her disease in the left lung.  She was supposed to start consolidation treatment with immunotherapy immediately after her induction phase but due to noncompliance.  She started the first cycle of her consolidation treatment with durvalumab  1500 mg IV every 4 weeks on 08/30/2023 status post 1 cycle.  She tolerated the first cycle of her treatment fairly well. Assessment and Plan Assessment & Plan Extensive stage small cell lung cancer Extensive stage small cell lung cancer, diagnosed in December 2024. Currently undergoing immunotherapy with no adverse effects from the first cycle. Reports no nausea, vomiting, diarrhea, headaches, or rash. She feels well and has gained weight. Lab results indicate very mild anemia,  otherwise well-managed. - Proceed with the second cycle of immunotherapy today - Schedule follow-up appointment in four weeks  History of early stage colon adenocarcinoma Early stage colon adenocarcinoma, diagnosed in December 2024. Previous colonoscopy indicated a 99.9% likelihood of complete tumor removal. No surgery performed yet. Discussion about potential surgical intervention for diverticulitis and cancer recurrence prevention. She will contact Doctor Robbert for a surgical referral. - Contact Doctor Rollin for a referral to a surgeon for potential surgical intervention  Anemia Very mild anemia noted in lab results. No specific symptoms or concerns related to anemia discussed. The patient was advised to call immediately if she has any concerning symptoms in the interval. The patient voices understanding of current disease status and treatment options and is in agreement with the current care plan.  All questions were answered. The patient knows to call the clinic with any problems, questions or concerns. We can certainly see the patient much sooner if necessary.  The total time spent in the appointment was 30 minutes including review of chart and various tests results,  discussions about plan of care and coordination of care plan .   Disclaimer: This note was dictated with voice recognition software. Similar sounding words can inadvertently be transcribed and may not be corrected upon review.

## 2023-10-01 ENCOUNTER — Telehealth: Payer: Self-pay | Admitting: Radiation Therapy

## 2023-10-01 DIAGNOSIS — M546 Pain in thoracic spine: Secondary | ICD-10-CM | POA: Diagnosis not present

## 2023-10-01 NOTE — Telephone Encounter (Signed)
 I called to check in with Jasmine Long to see how her PT is going and if she is agreeable to go ahead and schedule her follow-up brain MRI. She replied that she not experienced any relief from a pain or ability to lay flat standpoint. She has only attended four out of twelve approved sessions and will probably request more once they have been completed. Jasmine Long does not wish to schedule the follow-up brain MRI until she feels she will be able to lay down with ease.   I will continue to check in with her and let her providers know.   Devere Perch R.T(R)(T) Radiation Special Procedures Lead

## 2023-10-03 ENCOUNTER — Other Ambulatory Visit: Payer: Self-pay | Admitting: Hematology

## 2023-10-04 DIAGNOSIS — M546 Pain in thoracic spine: Secondary | ICD-10-CM | POA: Diagnosis not present

## 2023-10-06 DIAGNOSIS — M546 Pain in thoracic spine: Secondary | ICD-10-CM | POA: Diagnosis not present

## 2023-10-13 DIAGNOSIS — M546 Pain in thoracic spine: Secondary | ICD-10-CM | POA: Diagnosis not present

## 2023-10-14 ENCOUNTER — Encounter: Payer: Self-pay | Admitting: Emergency Medicine

## 2023-10-14 NOTE — Progress Notes (Signed)
 Pt left a message for this NN asking to explain her upcoming infusion schedule.  This NN called the pt back and pt wanted to know why she had an infusion scheduled for 10/2 and also on 10/30. This NN explained that her infusion is every 4 weeks. Pt verbalized understanding.  Pt lets NN know that she is going to be at the beach the last 2 weeks of Nov and the last 2 weeks of Dec and that those vacation times will interfere with her subsequent infusion. Pt asked if her infusion could be moved to avoid the times at the beach. NN states she will ask Dr Sherrod and call the pt back if the schedule can be moved.  NN spoke to Dr Sherrod, who approved moving the pt's infusion to avoiding conflicting with her travel plans. NN spoke to scheduler, who was able to move the pts lab/MD/infusion appts to 11/3. NN called pt back to let her know changes have been made and they will be reflected in her MyChart.

## 2023-10-15 DIAGNOSIS — M546 Pain in thoracic spine: Secondary | ICD-10-CM | POA: Diagnosis not present

## 2023-10-20 DIAGNOSIS — M546 Pain in thoracic spine: Secondary | ICD-10-CM | POA: Diagnosis not present

## 2023-10-28 ENCOUNTER — Inpatient Hospital Stay (HOSPITAL_BASED_OUTPATIENT_CLINIC_OR_DEPARTMENT_OTHER): Admitting: Internal Medicine

## 2023-10-28 ENCOUNTER — Inpatient Hospital Stay: Attending: Hematology

## 2023-10-28 ENCOUNTER — Inpatient Hospital Stay

## 2023-10-28 VITALS — BP 136/84 | HR 87 | Temp 98.3°F | Resp 17 | Ht 69.0 in | Wt 182.7 lb

## 2023-10-28 DIAGNOSIS — F17210 Nicotine dependence, cigarettes, uncomplicated: Secondary | ICD-10-CM | POA: Diagnosis not present

## 2023-10-28 DIAGNOSIS — Z79620 Long term (current) use of immunosuppressive biologic: Secondary | ICD-10-CM | POA: Insufficient documentation

## 2023-10-28 DIAGNOSIS — Z5112 Encounter for antineoplastic immunotherapy: Secondary | ICD-10-CM | POA: Insufficient documentation

## 2023-10-28 DIAGNOSIS — C7931 Secondary malignant neoplasm of brain: Secondary | ICD-10-CM | POA: Insufficient documentation

## 2023-10-28 DIAGNOSIS — C3412 Malignant neoplasm of upper lobe, left bronchus or lung: Secondary | ICD-10-CM

## 2023-10-28 DIAGNOSIS — C3490 Malignant neoplasm of unspecified part of unspecified bronchus or lung: Secondary | ICD-10-CM | POA: Diagnosis not present

## 2023-10-28 LAB — CBC WITH DIFFERENTIAL (CANCER CENTER ONLY)
Abs Immature Granulocytes: 0 K/uL (ref 0.00–0.07)
Basophils Absolute: 0 K/uL (ref 0.0–0.1)
Basophils Relative: 1 %
Eosinophils Absolute: 0.1 K/uL (ref 0.0–0.5)
Eosinophils Relative: 3 %
HCT: 36.3 % (ref 36.0–46.0)
Hemoglobin: 12.2 g/dL (ref 12.0–15.0)
Immature Granulocytes: 0 %
Lymphocytes Relative: 21 %
Lymphs Abs: 1 K/uL (ref 0.7–4.0)
MCH: 29.1 pg (ref 26.0–34.0)
MCHC: 33.6 g/dL (ref 30.0–36.0)
MCV: 86.6 fL (ref 80.0–100.0)
Monocytes Absolute: 0.5 K/uL (ref 0.1–1.0)
Monocytes Relative: 11 %
Neutro Abs: 3.2 K/uL (ref 1.7–7.7)
Neutrophils Relative %: 64 %
Platelet Count: 228 K/uL (ref 150–400)
RBC: 4.19 MIL/uL (ref 3.87–5.11)
RDW: 14.5 % (ref 11.5–15.5)
WBC Count: 5 K/uL (ref 4.0–10.5)
nRBC: 0 % (ref 0.0–0.2)

## 2023-10-28 LAB — CMP (CANCER CENTER ONLY)
ALT: 12 U/L (ref 0–44)
AST: 12 U/L — ABNORMAL LOW (ref 15–41)
Albumin: 4.3 g/dL (ref 3.5–5.0)
Alkaline Phosphatase: 109 U/L (ref 38–126)
Anion gap: 6 (ref 5–15)
BUN: 26 mg/dL — ABNORMAL HIGH (ref 8–23)
CO2: 26 mmol/L (ref 22–32)
Calcium: 10.2 mg/dL (ref 8.9–10.3)
Chloride: 104 mmol/L (ref 98–111)
Creatinine: 1.05 mg/dL — ABNORMAL HIGH (ref 0.44–1.00)
GFR, Estimated: 57 mL/min — ABNORMAL LOW (ref >60)
Glucose, Bld: 98 mg/dL (ref 70–99)
Potassium: 4.9 mmol/L (ref 3.5–5.1)
Sodium: 136 mmol/L (ref 135–145)
Total Bilirubin: 0.5 mg/dL (ref 0.0–1.2)
Total Protein: 7.9 g/dL (ref 6.5–8.1)

## 2023-10-28 LAB — TSH: TSH: 4.65 u[IU]/mL — ABNORMAL HIGH (ref 0.350–4.500)

## 2023-10-28 MED ORDER — SODIUM CHLORIDE 0.9 % IV SOLN
1500.0000 mg | Freq: Once | INTRAVENOUS | Status: AC
  Administered 2023-10-28: 1500 mg via INTRAVENOUS
  Filled 2023-10-28: qty 30

## 2023-10-28 MED ORDER — SODIUM CHLORIDE 0.9 % IV SOLN: INTRAVENOUS | Status: DC

## 2023-10-28 NOTE — Patient Instructions (Signed)

## 2023-10-28 NOTE — Progress Notes (Signed)
 [facility] [phone]   [phone]  OFFICE PROGRESS NOTE  [name], MD 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    PRIOR THERAPY:  1) SRS to solitary brain metastasis under the care of Dr. Patrcia  2) systemic chemotherapy with carboplatin  for an AUC 5 and etoposide  100 mg/m on days 1, 2, and 3 IV every 3 weeks.  Imfinzi  will be added once she completes palliative radiation.  Status post 4 cycles.  This is concurrent with radiotherapy completed on April 05, 2023.  CURRENT THERAPY: Consolidation treatment with immunotherapy with Imfinzi  1500 Mg IV every 4 weeks.  First dose 08/30/2023.  Status post 2 cycles.  INTERVAL HISTORY: Jasmine Long 73 y.o. female returns to the clinic today for follow-up visit.  Discussed the use of AI scribe software for clinical note transcription with the patient, who gave verbal consent to proceed.  History of Present Illness Jasmine Long is a 73 year old female with extensive stage small cell lung cancer who presents for evaluation before starting cycle number three of immunotherapy.  She was diagnosed with extensive stage small cell lung cancer in December 2024. She underwent stereotactic radiosurgery for a solitary brain metastasis and received systemic chemotherapy with carboplatin  and etoposide , concurrent with radiotherapy for limited disease in the lung over four cycles. Following a good response to this treatment, she began consolidation treatment with durvalumab   1500 mg IV every four weeks. She is currently post two cycles and is here for evaluation before starting the third cycle.  The second cycle of immunotherapy was well-tolerated, and she describes the treatment as 'easy' compared to chemotherapy.  She is concerned about her blood sugar levels, which may be elevated due to recent eating homemade sugar cookies.  She [place] and has scheduled various appointments, including physical therapy twice a week and a colonoscopy.    MEDICAL HISTORY: Past Medical History:  Diagnosis Date   Allergy    Anxiety    Cancer (HCC)    small cell lung cancer   Complication of anesthesia    per patient she was hard to wake up   Depression    Diabetes mellitus without complication (HCC)    type 2, does not check blood sugar   Heart murmur    never has caused any problems   Hypertension    Hypothyroidism    Neuromuscular disorder (HCC)    bilateral lower extremity - numbness   Pneumonia    as a child   Thyroid  disease    Ulcer     ALLERGIES:  is allergic to other.  MEDICATIONS:  Current Outpatient Medications  Medication Sig Dispense Refill   ALPRAZolam  (XANAX ) 0.5 MG tablet Take 1 tablet by mouth twice daily as needed for anxiety 30 tablet 2   atorvastatin  (LIPITOR) 20 MG tablet Take 1 tablet by mouth once daily 90 tablet 1   buPROPion  (WELLBUTRIN  XL) 300 MG 24 hr tablet Take 1 tablet by mouth once daily 90 tablet 1  busPIRone  (BUSPAR ) 7.5 MG tablet Take 1 tablet (7.5 mg total) by mouth 2 (two) times daily. 60 tablet 1   cephALEXin  (KEFLEX ) 500 MG capsule Take 1 capsule (500 mg total) by mouth 4 (four) times daily. (Patient not taking: Reported on 09/06/2023) 28 capsule 0   clonazePAM  (KLONOPIN ) 1 MG tablet Take 1 mg by mouth daily as needed for anxiety (Sleep).     escitalopram  (LEXAPRO ) 10 MG tablet Take 1 tablet (10 mg total) by mouth daily. 90 tablet 3   gabapentin (NEURONTIN) 300 MG capsule Take 300 mg by  mouth daily as needed (numbness).     levothyroxine  (SYNTHROID ) 125 MCG tablet TAKE 1 TABLET BY MOUTH ONCE DAILY BEFORE BREAKFAST 90 tablet 0   lidocaine  (LIDODERM ) 5 % Place 1 patch onto the skin daily. Remove & Discard patch within 12 hours or as directed by MD (Patient not taking: Reported on 09/06/2023) 30 patch 0   lidocaine -prilocaine  (EMLA ) cream Apply 1 Application topically as needed. 30 g 2   metFORMIN  (GLUCOPHAGE ) 1000 MG tablet TAKE 1 TABLET BY MOUTH TWICE DAILY WITH A MEAL 180 tablet 1   metoprolol  succinate (TOPROL -XL) 25 MG 24 hr tablet Take 1 tablet by mouth once daily 90 tablet 3   nicotine  (NICODERM CQ ) 21 mg/24hr patch Place 1 patch (21 mg total) onto the skin daily. (Patient not taking: Reported on 09/06/2023) 28 patch 3   ondansetron  (ZOFRAN ) 8 MG tablet Take 1 tablet (8 mg total) by mouth every 8 (eight) hours as needed for nausea or vomiting. 30 tablet 1   pantoprazole  (PROTONIX ) 40 MG tablet Take 1 tablet twice a day for 2 weeks and then daily after that (Patient not taking: Reported on 09/06/2023) 90 tablet 3   sodium chloride  (OCEAN) 0.65 % SOLN nasal spray Place 1 spray into both nostrils daily as needed (Sinus).     traMADol  (ULTRAM ) 50 MG tablet Take 1 tablet (50 mg total) by mouth every 6 (six) hours as needed. 20 tablet 0   valsartan -hydrochlorothiazide  (DIOVAN -HCT) 160-12.5 MG tablet Take 1 tablet by mouth once daily 90 tablet 3   No current facility-administered medications for this visit.    SURGICAL HISTORY:  Past Surgical History:  Procedure Laterality Date   BREAST SURGERY Right    lumpectomy- benign   CESAREAN SECTION  1985   CHOLECYSTECTOMY  1980   open   COLONOSCOPY WITH PROPOFOL  N/A 10/30/2022   Procedure: COLONOSCOPY WITH PROPOFOL ;  Surgeon: Rollin Dover, MD;  Location: [facility];  Service: Gastroenterology;  Laterality: N/A;   ENDOSCOPIC MUCOSAL RESECTION  10/30/2022   Procedure: ENDOSCOPIC MUCOSAL RESECTION;  Surgeon: Rollin Dover, MD;   Location: [facility];  Service: Gastroenterology;;   HEMOSTASIS CLIP PLACEMENT  10/30/2022   Procedure: HEMOSTASIS CLIP PLACEMENT;  Surgeon: Rollin Dover, MD;  Location: [facility];  Service: Gastroenterology;;   KYPHOPLASTY N/A 07/20/2023   Procedure: KYPHOPLASTY THORACIC TWELVE;  Surgeon: Louis Shove, MD;  Location: [facility];  Service: Neurosurgery;  Laterality: N/A;   NASAL SEPTUM SURGERY     POLYPECTOMY  10/30/2022   Procedure: POLYPECTOMY;  Surgeon: Rollin Dover, MD;  Location: [facility];  Service: Gastroenterology;;   ROBLEY LIFTING INJECTION  10/30/2022   Procedure: SUBMUCOSAL LIFTING INJECTION;  Surgeon: Rollin Dover, MD;  Location: [facility];  Service: Gastroenterology;;    REVIEW OF SYSTEMS:  A comprehensive review of systems was negative except for: Constitutional: positive for fatigue   PHYSICAL EXAMINATION: General appearance: alert, cooperative, fatigued, and no distress Head: Normocephalic, without  obvious abnormality, atraumatic Neck: no adenopathy, no JVD, supple, symmetrical, trachea midline, and thyroid  not enlarged, symmetric, no tenderness/mass/nodules Lymph nodes: Cervical, supraclavicular, and axillary nodes normal. Resp: clear to auscultation bilaterally Back: symmetric, no curvature. ROM normal. No CVA tenderness. Cardio: regular rate and rhythm, S1, S2 normal, no murmur, click, rub or gallop GI: soft, non-tender; bowel sounds normal; no masses,  no organomegaly Extremities: extremities normal, atraumatic, no cyanosis or edema  ECOG PERFORMANCE STATUS: 1 - Symptomatic but completely ambulatory  Blood pressure (!) 146/88, pulse 87, temperature 98.3 F (36.8 C), resp. rate 17, height 5' 9 (1.753 m), weight 182 lb 11.2 oz (82.9 kg), SpO2 98%.  LABORATORY DATA: Lab Results  Component Value Date   WBC 5.2 09/30/2023   HGB 11.9 (L) 09/30/2023   HCT 36.6 09/30/2023   MCV 88.6 09/30/2023   PLT 277 09/30/2023      Chemistry      Component Value  Date/Time   NA 139 09/30/2023 0950   NA 134 09/11/2022 1259   K 4.2 09/30/2023 0950   CL 105 09/30/2023 0950   CO2 27 09/30/2023 0950   BUN 23 09/30/2023 0950   BUN 12 09/11/2022 1259   CREATININE 0.96 09/30/2023 0950   CREATININE 0.98 10/30/2013 1118      Component Value Date/Time   CALCIUM  9.4 09/30/2023 0950   ALKPHOS 122 09/30/2023 0950   AST 14 (L) 09/30/2023 0950   ALT 12 09/30/2023 0950   BILITOT 0.4 09/30/2023 0950       RADIOGRAPHIC STUDIES: No results found.   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  She is currently undergoing systemic chemotherapy with carboplatin  for AUC of 5 on day 1 and etoposide  100 Mg/M2 on days 1, 2 and 3 status post 4 cycles.  This is concurrent with radiotherapy treated as limited stage disease because of the limited volume of her disease in the left lung.  She was supposed to start consolidation treatment with immunotherapy immediately after her induction phase but due to noncompliance.  She started the first cycle of her consolidation treatment with durvalumab  1500 mg IV every 4 weeks on 08/30/2023 status post 2 cycles.  She tolerated the first cycle of her treatment fairly well. Assessment and Plan Assessment & Plan Extensive stage small cell lung cancer with brain metastasis Extensive stage small cell lung cancer with solitary brain metastasis, diagnosed in December 2024. Post stereotactic radiosurgery to brain metastasis and systemic chemotherapy with carboplatin  and etoposide , concurrent with radiotherapy for limited lung disease. Currently undergoing consolidation treatment with durvalumab , with good response. Completed two cycles of durvalumab , with evaluation today  before starting cycle three. No significant adverse effects reported, except for difficulty with venous access. - Administer durvalumab  1500 mg IV every four weeks - Order scan on October 27 to evaluate treatment response - Schedule next treatment for November 3    The patient voices understanding of current disease status and treatment options and is in agreement with the current care plan.  All questions were answered. The patient knows to call the clinic with any problems, questions or concerns. We can certainly see the patient much sooner if necessary.  The total time spent in the appointment was 30 minutes including review of  chart and various tests results, discussions about plan of care and coordination of care plan .   Disclaimer: This note was dictated with voice recognition software. Similar sounding words can inadvertently be transcribed and may not be corrected upon review.

## 2023-10-29 ENCOUNTER — Telehealth: Payer: Self-pay

## 2023-10-29 LAB — T4: T4, Total: 7 ug/dL (ref 4.5–12.0)

## 2023-10-29 NOTE — Telephone Encounter (Signed)
 Error

## 2023-10-29 NOTE — Telephone Encounter (Signed)
 Patient called and left a voicemail stating she was charged a $15 co-pay for her visit with Dr. Sherrod yesterday. She reports that, according to her insurance with [facility], her out-of-pocket expenses have been met for the year and that [facility] should be covering 100% of costs until the new year. Patient states she has already spoken with the billing department and was advised to contact our office. Message will be routed to Nathanel Sharps for review.

## 2023-11-12 ENCOUNTER — Encounter: Payer: Self-pay | Admitting: Internal Medicine

## 2023-11-16 DIAGNOSIS — M546 Pain in thoracic spine: Secondary | ICD-10-CM | POA: Diagnosis not present

## 2023-11-17 DIAGNOSIS — S22080A Wedge compression fracture of T11-T12 vertebra, initial encounter for closed fracture: Secondary | ICD-10-CM | POA: Diagnosis not present

## 2023-11-17 DIAGNOSIS — Z6826 Body mass index (BMI) 26.0-26.9, adult: Secondary | ICD-10-CM | POA: Diagnosis not present

## 2023-11-18 DIAGNOSIS — M546 Pain in thoracic spine: Secondary | ICD-10-CM | POA: Diagnosis not present

## 2023-11-22 ENCOUNTER — Ambulatory Visit (HOSPITAL_COMMUNITY)
Admission: RE | Admit: 2023-11-22 | Discharge: 2023-11-22 | Disposition: A | Discharge status: Home / Self Care | Source: Ambulatory Visit | Attending: Internal Medicine | Admitting: Internal Medicine

## 2023-11-22 DIAGNOSIS — K5730 Diverticulosis of large intestine without perforation or abscess without bleeding: Secondary | ICD-10-CM | POA: Diagnosis not present

## 2023-11-22 DIAGNOSIS — C3490 Malignant neoplasm of unspecified part of unspecified bronchus or lung: Secondary | ICD-10-CM | POA: Diagnosis not present

## 2023-11-22 DIAGNOSIS — I700 Atherosclerosis of aorta: Secondary | ICD-10-CM | POA: Diagnosis not present

## 2023-11-22 MED ORDER — IOHEXOL 300 MG/ML  SOLN
100.0000 mL | Freq: Once | INTRAMUSCULAR | Status: AC | PRN
  Administered 2023-11-22: 100 mL via INTRAVENOUS

## 2023-11-24 ENCOUNTER — Encounter: Payer: Self-pay | Admitting: Emergency Medicine

## 2023-11-24 ENCOUNTER — Ambulatory Visit: Admitting: Emergency Medicine

## 2023-11-24 VITALS — BP 130/76 | HR 79 | Temp 98.2°F | Ht 69.0 in | Wt 190.0 lb

## 2023-11-24 DIAGNOSIS — F419 Anxiety disorder, unspecified: Secondary | ICD-10-CM | POA: Diagnosis not present

## 2023-11-24 DIAGNOSIS — E785 Hyperlipidemia, unspecified: Secondary | ICD-10-CM

## 2023-11-24 DIAGNOSIS — E1169 Type 2 diabetes mellitus with other specified complication: Secondary | ICD-10-CM | POA: Diagnosis not present

## 2023-11-24 DIAGNOSIS — C180 Malignant neoplasm of cecum: Secondary | ICD-10-CM

## 2023-11-24 DIAGNOSIS — C3412 Malignant neoplasm of upper lobe, left bronchus or lung: Secondary | ICD-10-CM

## 2023-11-24 DIAGNOSIS — J410 Simple chronic bronchitis: Secondary | ICD-10-CM | POA: Diagnosis not present

## 2023-11-24 DIAGNOSIS — R937 Abnormal findings on diagnostic imaging of other parts of musculoskeletal system: Secondary | ICD-10-CM | POA: Diagnosis not present

## 2023-11-24 DIAGNOSIS — E039 Hypothyroidism, unspecified: Secondary | ICD-10-CM

## 2023-11-24 DIAGNOSIS — J329 Chronic sinusitis, unspecified: Secondary | ICD-10-CM

## 2023-11-24 DIAGNOSIS — I10 Essential (primary) hypertension: Secondary | ICD-10-CM | POA: Diagnosis not present

## 2023-11-24 MED ORDER — DOXYCYCLINE HYCLATE 100 MG PO TABS: 100.0000 mg | ORAL_TABLET | Freq: Two times a day (BID) | ORAL | 1 refills | Status: AC

## 2023-11-24 NOTE — Assessment & Plan Note (Signed)
 Chronic multilevel degenerative disc disease Occasional lumbar pain.  Well-controlled.

## 2023-11-24 NOTE — Assessment & Plan Note (Signed)
 Clinically stable. Continues daily Lexapro  10 mg and BuSpar  7.5 mg twice a day. Alprazolam  as needed.  Known triggers

## 2023-11-24 NOTE — Assessment & Plan Note (Signed)
 Clinically euthyroid Not taking Synthroid  at present time Lab Results  Component Value Date   TSH 4.650 (H) 10/28/2023

## 2023-11-24 NOTE — Assessment & Plan Note (Signed)
 With acute exacerbation today Responds very well to doxycycline  100 mg twice a day for 7 days Symptom management discussed Recommend saline lavages frequently during the day

## 2023-11-24 NOTE — Assessment & Plan Note (Signed)
 Continues to smoke despite cancer diagnosis

## 2023-11-24 NOTE — Assessment & Plan Note (Signed)
 Clinically stable. Undergoing immunotherapy after finishing chemotherapy Had radiation therapy as well. Responding well to treatment No complications.

## 2023-11-24 NOTE — Progress Notes (Signed)
 Jasmine Long 73 y.o.   Chief Complaint  Patient presents with   Sinusitis    Has been going on for a week     HISTORY OF PRESENT ILLNESS: Acute problem visit today. This is a 73 y.o. female complaining of exacerbation of chronic sinusitis for the last week Usually antibiotics helped a great deal.  Still having chronic drainage issues Presently getting treatment for metastatic cancer with good results.  Sinusitis Associated symptoms include congestion and coughing. Pertinent negatives include no chills, headaches, shortness of breath or sore throat.     Prior to Admission medications   Medication Sig Start Date End Date Taking? Authorizing Provider  doxycycline  (VIBRA -TABS) 100 MG tablet Take 1 tablet (100 mg total) by mouth 2 (two) times daily for 7 days. 11/24/23 12/01/23 Yes SagardiaEmil Schanz, MD  ALPRAZolam  (XANAX ) 0.5 MG tablet Take 1 tablet by mouth twice daily as needed for anxiety 09/01/23   Mystic Labo Jose, MD  atorvastatin  (LIPITOR) 20 MG tablet Take 1 tablet by mouth once daily 08/18/22   Valyn Latchford Jose, MD  buPROPion  (WELLBUTRIN  XL) 300 MG 24 hr tablet Take 1 tablet by mouth once daily 08/18/22   Dilynn Munroe Jose, MD  busPIRone  (BUSPAR ) 7.5 MG tablet Take 1 tablet (7.5 mg total) by mouth 2 (two) times daily. 07/16/22   [name], MD  cephALEXin  (KEFLEX ) 500 MG capsule Take 1 capsule (500 mg total) by mouth 4 (four) times daily. Patient not taking: Reported on 11/24/2023 08/10/23   Randol Simmonds, MD  clonazePAM  (KLONOPIN ) 1 MG tablet Take 1 mg by mouth daily as needed for anxiety (Sleep).    [provider]  escitalopram  (LEXAPRO ) 10 MG tablet Take 1 tablet (10 mg total) by mouth daily. 12/25/21 09/06/23  [name], MD  levothyroxine  (SYNTHROID ) 125 MCG tablet TAKE 1 TABLET BY MOUTH ONCE DAILY BEFORE BREAKFAST 04/14/22   [name], MD  metFORMIN  (GLUCOPHAGE ) 1000 MG tablet TAKE 1 TABLET BY MOUTH TWICE DAILY WITH A MEAL  08/18/22   Deon Ivey Jose, MD  metoprolol  succinate (TOPROL -XL) 25 MG 24 hr tablet Take 1 tablet by mouth once daily 05/27/22   Malak Orantes Jose, MD  nicotine  (NICODERM CQ ) 21 mg/24hr patch Place 1 patch (21 mg total) onto the skin daily. Patient not taking: Reported on 11/24/2023 11/26/22   Whitfield Dulay Jose, MD  ondansetron  (ZOFRAN ) 8 MG tablet Take 1 tablet (8 mg total) by mouth every 8 (eight) hours as needed for nausea or vomiting. 01/15/23   Bruning, Ashlyn, PA-C  traMADol  (ULTRAM ) 50 MG tablet Take 1 tablet (50 mg total) by mouth every 6 (six) hours as needed. 07/20/23 07/19/24  Louis Shove, MD  valsartan -hydrochlorothiazide  (DIOVAN -HCT) 160-12.5 MG tablet Take 1 tablet by mouth once daily 05/27/22   Syleena Mchan Jose, MD    Allergies  Allergen Reactions   Other Other (See Comments)    Year long allergies    Patient Active Problem List   Diagnosis Date Noted   Pain in thoracic spine 09/08/2023   Encounter for antineoplastic immunotherapy 05/17/2023   Goals of care, counseling/discussion 05/17/2023   Encounter for antineoplastic chemotherapy 02/23/2023   Metastasis to brain [facility]) 02/23/2023   Dysuria 02/06/2023   Screening for colon cancer 12/09/2022   Malignant neoplasm of ascending colon (HCC) 12/09/2022   Small cell lung cancer (HCC) 12/09/2022   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   Weight loss 05/13/2022   Change in multiple pigmented skin lesions 04/22/2022  Paresthesia of both lower extremities 04/22/2022   Abnormal MRI, lumbar spine 04/22/2022   Vitreous floaters of right eye 04/07/2022   Hematoma of scalp 12/25/2021   Smokers' cough (HCC) 12/26/2020   Chronic bilateral low back pain with left-sided sciatica 12/26/2020   Chronic sinus complaints 12/26/2020   Chronic anxiety 06/28/2019   History of peptic ulcer 04/30/2017   Hyperlipidemia 12/11/2015   Insomnia 12/11/2015   Panic attack as reaction to stress 12/11/2015   Current smoker  12/11/2015   Chronic sinusitis 12/11/2015   Dyslipidemia associated with type 2 diabetes mellitus (HCC) 05/15/2015   Hypothyroid 04/29/2011   BMI 40.0-44.9, adult (HCC) 04/29/2011   Depression with anxiety 04/29/2011   GERD (gastroesophageal reflux disease) 04/29/2011   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   Nicotine  addiction 04/29/2011   Essential hypertension 04/29/2011   Heart murmur 04/29/2011    Past Medical History:  Diagnosis Date   Allergy    Anxiety    Cancer (HCC)    small cell lung cancer   Complication of anesthesia    per patient she was hard to wake up   Depression    Diabetes mellitus without complication (HCC)    type 2, does not check blood sugar   Heart murmur    never has caused any problems   Hypertension    Hypothyroidism    Neuromuscular disorder (HCC)    bilateral lower extremity - numbness   Pneumonia    as a child   Thyroid  disease    Ulcer     Past Surgical History:  Procedure Laterality Date   BREAST SURGERY Right    lumpectomy- benign   CESAREAN SECTION  1985   CHOLECYSTECTOMY  1980   open   COLONOSCOPY WITH PROPOFOL  N/A 10/30/2022   Procedure: COLONOSCOPY WITH PROPOFOL ;  Surgeon: Rollin Dover, MD;  Location: [facility];  Service: Gastroenterology;  Laterality: N/A;   ENDOSCOPIC MUCOSAL RESECTION  10/30/2022   Procedure: ENDOSCOPIC MUCOSAL RESECTION;  Surgeon: Rollin Dover, MD;  Location: [facility];  Service: Gastroenterology;;   HEMOSTASIS CLIP PLACEMENT  10/30/2022   Procedure: HEMOSTASIS CLIP PLACEMENT;  Surgeon: Rollin Dover, MD;  Location: [facility];  Service: Gastroenterology;;   KYPHOPLASTY N/A 07/20/2023   Procedure: KYPHOPLASTY THORACIC TWELVE;  Surgeon: Louis Shove, MD;  Location: [facility];  Service: Neurosurgery;  Laterality: N/A;   NASAL SEPTUM SURGERY     POLYPECTOMY  10/30/2022   Procedure: POLYPECTOMY;  Surgeon: Rollin Dover, MD;  Location: [facility];  Service: Gastroenterology;;   ROBLEY LIFTING INJECTION   10/30/2022   Procedure: SUBMUCOSAL LIFTING INJECTION;  Surgeon: Rollin Dover, MD;  Location: [facility];  Service: Gastroenterology;;    Social History   Socioeconomic History   Marital status: Divorced    Spouse name: Not on file   Number of children: 2   Years of education: Not on file   Highest education level: Bachelor's degree (e.g., BA, AB, BS)  Occupational History   Occupation: Unemployed  Tobacco Use   Smoking status: Former    Current packs/day: 0.00    Average packs/day: 1.5 packs/day for 40.0 years (60.0 ttl pk-yrs)    Types: Cigarettes    Start date: 01/20/1972    Quit date: 01/20/2012    Years since quitting: 11.8   Smokeless tobacco: Never   Tobacco comments:    Hx of E-cigarette use as of 01/20/12.  No longer using E-cig per pt as of 07/15/23.  Vaping Use   Vaping status: Former   Substances: Nicotine ,  Flavoring  Substance and Sexual Activity   Alcohol use: No   Drug use: No   Sexual activity: Not Currently    Birth control/protection: Post-menopausal  Other Topics Concern   Not on file  Social History Narrative   Patient on worker's compensation for an injury sustained on the job 5 years ago.      Right handed and Left Handed       Lives in a two story home       One son deceased.    Social Drivers of Corporate Investment Banker Strain: Low Risk  (09/06/2023)   Overall Financial Resource Strain (CARDIA)    Difficulty of Paying Living Expenses: Not hard at all  Food Insecurity: No Food Insecurity (09/06/2023)   Hunger Vital Sign    Worried About Running Out of Food in the Last Year: Never true    Ran Out of Food in the Last Year: Never true  Transportation Needs: No Transportation Needs (09/06/2023)   PRAPARE - Administrator, Civil Service (Medical): No    Lack of Transportation (Non-Medical): No  Physical Activity: Inactive (09/06/2023)   Exercise Vital Sign    Days of Exercise per Week: 0 days    Minutes of Exercise per Session:  0 min  Stress: Stress Concern Present (09/06/2023)   [employer] of Occupational Health - Occupational Stress Questionnaire    Feeling of Stress: Very much  Social Connections: Socially Isolated (09/06/2023)   Social Connection and Isolation Panel    Frequency of Communication with Friends and Family: Twice a week    Frequency of Social Gatherings with Friends and Family: Never    Attends Religious Services: Never    Database Administrator or Organizations: No    Attends Banker Meetings: Never    Marital Status: Divorced  Catering Manager Violence: Not At Risk (09/06/2023)   Humiliation, Afraid, Rape, and Kick questionnaire    Fear of Current or Ex-Partner: No    Emotionally Abused: No    Physically Abused: No    Sexually Abused: No    Family History  Problem Relation Age of Onset   Lung cancer Mother 9   Dementia Father    Atrial fibrillation Father    Cancer Paternal Aunt        breast cancer   Colon cancer Paternal Aunt    Colon cancer Cousin      Review of Systems  Constitutional: Negative.  Negative for chills and fever.  HENT:  Positive for congestion and sinus pain. Negative for sore throat.   Respiratory:  Positive for cough. Negative for shortness of breath.   Cardiovascular: Negative.  Negative for chest pain and palpitations.  Gastrointestinal:  Negative for abdominal pain, diarrhea, nausea and vomiting.  Genitourinary: Negative.  Negative for dysuria and hematuria.  Skin: Negative.  Negative for rash.  Neurological: Negative.  Negative for dizziness and headaches.  All other systems reviewed and are negative.   Vitals:   11/24/23 1059  BP: 130/76  Pulse: 79  Temp: 98.2 F (36.8 C)  SpO2: 91%    Physical Exam Vitals reviewed.  Constitutional:      Appearance: Normal appearance.  HENT:     Head: Normocephalic.     Mouth/Throat:     Mouth: Mucous membranes are moist.     Pharynx: Oropharynx is clear.  Eyes:     Extraocular  Movements: Extraocular movements intact.  Cardiovascular:     Rate and  Rhythm: Normal rate.  Pulmonary:     Effort: Pulmonary effort is normal.  Musculoskeletal:     Cervical back: No tenderness.  Lymphadenopathy:     Cervical: No cervical adenopathy.  Skin:    General: Skin is warm and dry.  Neurological:     Mental Status: She is alert and oriented to person, place, and time.  Psychiatric:        Mood and Affect: Mood normal.        Behavior: Behavior normal.      ASSESSMENT & PLAN: A total of 40 minutes was spent with the patient and counseling/coordination of care regarding preparing for this visit, review of most recent office visit notes, review of multiple chronic medical conditions and their management, review of all medications, review of most recent bloodwork results, review of health maintenance items, education on nutrition, prognosis, documentation, and need for follow up.   Problem List Items Addressed This Visit       Cardiovascular and Mediastinum   Essential hypertension   BP Readings from Last 3 Encounters:  11/24/23 130/76  10/28/23 136/84  09/30/23 (!) 142/59  Well-controlled.  Presently off medication         Respiratory   Chronic sinusitis - Primary   With acute exacerbation today Responds very well to doxycycline  100 mg twice a day for 7 days Symptom management discussed Recommend saline lavages frequently during the day      Relevant Medications   doxycycline  (VIBRA -TABS) 100 MG tablet   Smokers' cough (HCC)   Continues to smoke despite cancer diagnosis      Small cell lung cancer (HCC)   Clinically stable. Undergoing immunotherapy after finishing chemotherapy Had radiation therapy as well. Responding well to treatment No complications.      Relevant Medications   doxycycline  (VIBRA -TABS) 100 MG tablet     Digestive   Adenocarcinoma of cecum (HCC)   Treatment on hold given metastatic lung cancer diagnosis and need for treatment        Relevant Medications   doxycycline  (VIBRA -TABS) 100 MG tablet     Endocrine   Hypothyroid   Clinically euthyroid Not taking Synthroid  at present time Lab Results  Component Value Date   TSH 4.650 (H) 10/28/2023         Dyslipidemia associated with type 2 diabetes mellitus (HCC)   Chronic stable conditions Presently not taking metformin  or atorvastatin          Other   Chronic anxiety   Clinically stable. Continues daily Lexapro  10 mg and BuSpar  7.5 mg twice a day. Alprazolam  as needed.  Known triggers      Abnormal MRI, lumbar spine   Chronic multilevel degenerative disc disease Occasional lumbar pain.  Well-controlled.      Patient Instructions  Sinus Infection, Adult A sinus infection is soreness and swelling (inflammation) of your sinuses. Sinuses are hollow spaces in the bones around your face. They are located: Around your eyes. In the middle of your forehead. Behind your nose. In your cheekbones. Your sinuses and nasal passages are lined with a fluid called mucus. Mucus drains out of your sinuses. Swelling can trap mucus in your sinuses. This lets germs (bacteria, virus, or fungus) grow, which leads to infection. Most of the time, this condition is caused by a virus. What are the causes? Allergies. Asthma. Germs. Things that block your nose or sinuses. Growths in the nose (nasal polyps). Chemicals or irritants in the air. A fungus. This is rare. What increases  the risk? Having a weak body defense system (immune system). Doing a lot of swimming or diving. Using nasal sprays too much. Smoking. What are the signs or symptoms? The main symptoms of this condition are pain and a feeling of pressure around the sinuses. Other symptoms include: Stuffy nose (congestion). This may make it hard to breathe through your nose. Runny nose (drainage). Soreness, swelling, and warmth in the sinuses. A cough that may get worse at night. Being unable to smell and  taste. Mucus that collects in the throat or the back of the nose (postnasal drip). This may cause a sore throat or bad breath. Being very tired (fatigued). A fever. How is this diagnosed? Your symptoms. Your medical history. A physical exam. Tests to find out if your condition is short-term (acute) or long-term (chronic). Your doctor may: Check your nose for growths (polyps). Check your sinuses using a tool that has a light on one end (endoscope). Check for allergies or germs. Do imaging tests, such as an MRI or CT scan. How is this treated? Treatment for this condition depends on the cause and whether it is short-term or long-term. If caused by a virus, your symptoms should go away on their own within 10 days. You may be given medicines to relieve symptoms. They include: Medicines that shrink swollen tissue in the nose. A spray that treats swelling of the nostrils. Rinses that help get rid of thick mucus in your nose (nasal saline washes). Medicines that treat allergies (antihistamines). Over-the-counter pain relievers. If caused by bacteria, your doctor may wait to see if you will get better without treatment. You may be given antibiotic medicine if you have: A very bad infection. A weak body defense system. If caused by growths in the nose, surgery may be needed. Follow these instructions at home: Medicines Take, use, or apply over-the-counter and prescription medicines only as told by your doctor. These may include nasal sprays. If you were prescribed an antibiotic medicine, take it as told by your doctor. Do not stop taking it even if you start to feel better. Hydrate and humidify  Drink enough water to keep your pee (urine) pale yellow. Use a cool mist humidifier to keep the humidity level in your home above 50%. Breathe in steam for 10-15 minutes, 3-4 times a day, or as told by your doctor. You can do this in the bathroom while a hot shower is running. Try not to spend time  in cool or dry air. Rest Rest as much as you can. Sleep with your head raised (elevated). Make sure you get enough sleep each night. General instructions  Put a warm, moist washcloth on your face 3-4 times a day, or as often as told by your doctor. Use nasal saline washes as often as told by your doctor. Wash your hands often with soap and water. If you cannot use soap and water, use hand sanitizer. Do not smoke. Avoid being around people who are smoking (secondhand smoke). Keep all follow-up visits. Contact a doctor if: You have a fever. Your symptoms get worse. Your symptoms do not get better within 10 days. Get help right away if: You have a very bad headache. You cannot stop vomiting. You have very bad pain or swelling around your face or eyes. You have trouble seeing. You feel confused. Your neck is stiff. You have trouble breathing. These symptoms may be an emergency. Get help right away. Call 911. Do not wait to see if the symptoms will  go away. Do not drive yourself to the hospital. Summary A sinus infection is swelling of your sinuses. Sinuses are hollow spaces in the bones around your face. This condition is caused by tissues in your nose that become inflamed or swollen. This traps germs. These can lead to infection. If you were prescribed an antibiotic medicine, take it as told by your doctor. Do not stop taking it even if you start to feel better. Keep all follow-up visits. This information is not intended to replace advice given to you by your health care provider. Make sure you discuss any questions you have with your health care provider. Document Revised: 12/17/2020 Document Reviewed: 12/17/2020 Elsevier Patient Education  2024 Elsevier Inc.     Emil Schaumann, MD [facility]

## 2023-11-24 NOTE — Assessment & Plan Note (Signed)
 BP Readings from Last 3 Encounters:  11/24/23 130/76  10/28/23 136/84  09/30/23 (!) 142/59  Well-controlled.  Presently off medication

## 2023-11-24 NOTE — Patient Instructions (Signed)

## 2023-11-24 NOTE — Assessment & Plan Note (Signed)
 Treatment on hold given metastatic lung cancer diagnosis and need for treatment

## 2023-11-24 NOTE — Assessment & Plan Note (Signed)
 Chronic stable conditions Presently not taking metformin  or atorvastatin 

## 2023-11-25 ENCOUNTER — Inpatient Hospital Stay

## 2023-11-25 ENCOUNTER — Inpatient Hospital Stay: Admitting: Internal Medicine

## 2023-11-25 DIAGNOSIS — M546 Pain in thoracic spine: Secondary | ICD-10-CM | POA: Diagnosis not present

## 2023-11-26 DIAGNOSIS — S22080A Wedge compression fracture of T11-T12 vertebra, initial encounter for closed fracture: Secondary | ICD-10-CM | POA: Diagnosis not present

## 2023-11-29 ENCOUNTER — Inpatient Hospital Stay

## 2023-11-29 ENCOUNTER — Inpatient Hospital Stay: Admitting: Internal Medicine

## 2023-11-29 ENCOUNTER — Inpatient Hospital Stay: Attending: Hematology

## 2023-11-29 VITALS — BP 154/57 | HR 86 | Temp 98.0°F | Resp 17 | Ht 69.0 in | Wt 191.0 lb

## 2023-11-29 DIAGNOSIS — Z5112 Encounter for antineoplastic immunotherapy: Secondary | ICD-10-CM | POA: Insufficient documentation

## 2023-11-29 DIAGNOSIS — C3412 Malignant neoplasm of upper lobe, left bronchus or lung: Secondary | ICD-10-CM

## 2023-11-29 DIAGNOSIS — C7931 Secondary malignant neoplasm of brain: Secondary | ICD-10-CM | POA: Diagnosis not present

## 2023-11-29 DIAGNOSIS — F17210 Nicotine dependence, cigarettes, uncomplicated: Secondary | ICD-10-CM | POA: Diagnosis not present

## 2023-11-29 LAB — CBC WITH DIFFERENTIAL (CANCER CENTER ONLY)
Abs Immature Granulocytes: 0.01 K/uL (ref 0.00–0.07)
Basophils Absolute: 0 K/uL (ref 0.0–0.1)
Basophils Relative: 1 %
Eosinophils Absolute: 0.2 K/uL (ref 0.0–0.5)
Eosinophils Relative: 4 %
HCT: 37.4 % (ref 36.0–46.0)
Hemoglobin: 12.5 g/dL (ref 12.0–15.0)
Immature Granulocytes: 0 %
Lymphocytes Relative: 21 %
Lymphs Abs: 1.1 K/uL (ref 0.7–4.0)
MCH: 29.6 pg (ref 26.0–34.0)
MCHC: 33.4 g/dL (ref 30.0–36.0)
MCV: 88.4 fL (ref 80.0–100.0)
Monocytes Absolute: 0.5 K/uL (ref 0.1–1.0)
Monocytes Relative: 10 %
Neutro Abs: 3.2 K/uL (ref 1.7–7.7)
Neutrophils Relative %: 64 %
Platelet Count: 299 K/uL (ref 150–400)
RBC: 4.23 MIL/uL (ref 3.87–5.11)
RDW: 15.8 % — ABNORMAL HIGH (ref 11.5–15.5)
WBC Count: 5 K/uL (ref 4.0–10.5)
nRBC: 0 % (ref 0.0–0.2)

## 2023-11-29 LAB — CMP (CANCER CENTER ONLY)
ALT: 10 U/L (ref 0–44)
AST: 13 U/L — ABNORMAL LOW (ref 15–41)
Albumin: 4.3 g/dL (ref 3.5–5.0)
Alkaline Phosphatase: 135 U/L — ABNORMAL HIGH (ref 38–126)
Anion gap: 7 (ref 5–15)
BUN: 22 mg/dL (ref 8–23)
CO2: 25 mmol/L (ref 22–32)
Calcium: 9.8 mg/dL (ref 8.9–10.3)
Chloride: 106 mmol/L (ref 98–111)
Creatinine: 1.03 mg/dL — ABNORMAL HIGH (ref 0.44–1.00)
GFR, Estimated: 58 mL/min — ABNORMAL LOW (ref >60)
Glucose, Bld: 87 mg/dL (ref 70–99)
Potassium: 4.7 mmol/L (ref 3.5–5.1)
Sodium: 138 mmol/L (ref 135–145)
Total Bilirubin: 0.3 mg/dL (ref 0.0–1.2)
Total Protein: 7.5 g/dL (ref 6.5–8.1)

## 2023-11-29 MED ORDER — SODIUM CHLORIDE 0.9 % IV SOLN: INTRAVENOUS | Status: DC

## 2023-11-29 MED ORDER — SODIUM CHLORIDE 0.9 % IV SOLN
1500.0000 mg | Freq: Once | INTRAVENOUS | Status: AC
  Administered 2023-11-29: 1500 mg via INTRAVENOUS
  Filled 2023-11-29: qty 30

## 2023-11-29 NOTE — Patient Instructions (Signed)

## 2023-11-29 NOTE — Progress Notes (Signed)
 [facility] [phone]   [phone]) [phone]  OFFICE PROGRESS NOTE  [name], MD [address] 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    PRIOR THERAPY:  1) SRS to solitary brain metastasis under the care of Dr. Patrcia  2) systemic chemotherapy with carboplatin  for an AUC 5 and etoposide  100 mg/m on days 1, 2, and 3 IV every 3 weeks.  Imfinzi  will be added once she completes palliative radiation.  Status post 4 cycles.  This is concurrent with radiotherapy completed on April 05, 2023.  CURRENT THERAPY: Consolidation treatment with immunotherapy with Imfinzi  1500 Mg IV every 4 weeks.  First dose 08/30/2023.  Status post 3 cycles.  INTERVAL HISTORY: Jasmine Long 73 y.o. female returns to the clinic today for follow-up visit.  Discussed the use of AI scribe software for clinical note transcription with the patient, who gave verbal consent to proceed.  History of Present Illness She is a 73 year old female with extensive stage small cell lung cancer and early stage colon adenocarcinoma who presents for evaluation before starting cycle four of immunotherapy.  She was diagnosed with extensive stage small cell lung cancer and early stage colon adenocarcinoma in December 2020. She has undergone stereotactic radiosurgery for a solitary brain metastasis followed by systemic chemotherapy with carboplatin  and etoposide  for four cycles concurrent with radiation. She is currently receiving consolidation treatment with durvalumab   1500 mg IV every four weeks and is here for evaluation before starting cycle number four.  She reports a weight gain of 20 pounds over two months since starting immunotherapy, accompanied by an increased appetite. She notes, 'I eat and thirty minutes later, I'm after a bowl of cereal.' No chest pain, shortness of breath, cough, nausea, vomiting, or diarrhea. During the review of symptoms, she denies experiencing any side effects from the immunotherapy other than the weight gain.  She mentions a recent sinus infection and is currently taking doxycycline  for it. She also reports being allergic to leaf mold.  In terms of her social history, she stays home most of the time, attends physical therapy for her back, and does not engage in regular exercise. She has also undergone a chiroplasty.    MEDICAL HISTORY: Past Medical History:  Diagnosis Date   Allergy    Anxiety    Cancer (HCC)    small cell lung cancer   Complication of anesthesia    per patient she was hard to wake up   Depression    Diabetes mellitus without complication (HCC)    type 2, does not check blood sugar   Heart murmur    never has caused any problems   Hypertension    Hypothyroidism    Neuromuscular disorder (HCC)    bilateral lower extremity - numbness   Pneumonia    as a child   Thyroid  disease    Ulcer     ALLERGIES:  is allergic to other.  MEDICATIONS:  Current Outpatient Medications  Medication Sig Dispense Refill   ALPRAZolam  (XANAX ) 0.5 MG tablet Take 1 tablet  by mouth twice daily as needed for anxiety 30 tablet 2   atorvastatin  (LIPITOR) 20 MG tablet Take 1 tablet by mouth once daily 90 tablet 1   buPROPion  (WELLBUTRIN  XL) 300 MG 24 hr tablet Take 1 tablet by mouth once daily 90 tablet 1   busPIRone  (BUSPAR ) 7.5 MG tablet Take 1 tablet (7.5 mg total) by mouth 2 (two) times daily. 60 tablet 1   cephALEXin  (KEFLEX ) 500 MG capsule Take 1 capsule (500 mg total) by mouth 4 (four) times daily. (Patient not  taking: Reported on 11/24/2023) 28 capsule 0   clonazePAM  (KLONOPIN ) 1 MG tablet Take 1 mg by mouth daily as needed for anxiety (Sleep).     doxycycline  (VIBRA -TABS) 100 MG tablet Take 1 tablet (100 mg total) by mouth 2 (two) times daily for 7 days. 14 tablet 1   escitalopram  (LEXAPRO ) 10 MG tablet Take 1 tablet (10 mg total) by mouth daily. 90 tablet 3   levothyroxine  (SYNTHROID ) 125 MCG tablet TAKE 1 TABLET BY MOUTH ONCE DAILY BEFORE BREAKFAST 90 tablet 0   metFORMIN  (GLUCOPHAGE ) 1000 MG tablet TAKE 1 TABLET BY MOUTH TWICE DAILY WITH A MEAL 180 tablet 1   metoprolol  succinate (TOPROL -XL) 25 MG 24 hr tablet Take 1 tablet by mouth once daily 90 tablet 3   nicotine  (NICODERM CQ ) 21 mg/24hr patch Place 1 patch (21 mg total) onto the skin daily. (Patient not taking: Reported on 11/24/2023) 28 patch 3   ondansetron  (ZOFRAN ) 8 MG tablet Take 1 tablet (8 mg total) by mouth every 8 (eight) hours as needed for nausea or vomiting. 30 tablet 1   traMADol  (ULTRAM ) 50 MG tablet Take 1 tablet (50 mg total) by mouth every 6 (six) hours as needed. 20 tablet 0   valsartan -hydrochlorothiazide  (DIOVAN -HCT) 160-12.5 MG tablet Take 1 tablet by mouth once daily 90 tablet 3   No current facility-administered medications for this visit.    SURGICAL HISTORY:  Past Surgical History:  Procedure Laterality Date   BREAST SURGERY Right    lumpectomy- benign   CESAREAN SECTION  1985   CHOLECYSTECTOMY  1980   open   COLONOSCOPY WITH PROPOFOL  N/A 10/30/2022   Procedure: COLONOSCOPY WITH PROPOFOL ;  Surgeon: Rollin Dover, MD;  Location: [facility];  Service: Gastroenterology;  Laterality: N/A;   ENDOSCOPIC MUCOSAL RESECTION  10/30/2022   Procedure: ENDOSCOPIC MUCOSAL RESECTION;  Surgeon: Rollin Dover, MD;  Location: [facility];  Service: Gastroenterology;;   HEMOSTASIS CLIP PLACEMENT  10/30/2022   Procedure: HEMOSTASIS CLIP PLACEMENT;  Surgeon: Rollin Dover, MD;  Location: [facility];  Service: Gastroenterology;;    KYPHOPLASTY N/A 07/20/2023   Procedure: KYPHOPLASTY THORACIC TWELVE;  Surgeon: Louis Shove, MD;  Location[facility];  Service: Neurosurgery;  Laterality: N/A;   NASAL SEPTUM SURGERY     POLYPECTOMY  10/30/2022   Procedure: POLYPECTOMY;  Surgeon: Rollin Dover, MD;  Location: [facility];  Service: Gastroenterology;;   ROBLEY LIFTING INJECTION  10/30/2022   Procedure: SUBMUCOSAL LIFTING INJECTION;  Surgeon: Rollin Dover, MD;  Location: [facility];  Service: Gastroenterology;;    REVIEW OF SYSTEMS:  Constitutional: positive for fatigue Eyes: negative Ears, nose, mouth, throat, and face: negative Respiratory: positive for dyspnea on exertion Cardiovascular: negative Gastrointestinal: negative Genitourinary:negative Integument/breast: negative Hematologic/lymphatic: negative Musculoskeletal:positive for arthralgias Neurological: negative Behavioral/Psych: negative Endocrine: negative Allergic/Immunologic: negative   PHYSICAL EXAMINATION: General appearance: alert, cooperative, fatigued, and no distress Head: Normocephalic, without obvious abnormality, atraumatic Neck: no adenopathy, no JVD, supple, symmetrical, trachea midline, and thyroid  not enlarged, symmetric,  no tenderness/mass/nodules Lymph nodes: Cervical, supraclavicular, and axillary nodes normal. Resp: clear to auscultation bilaterally Back: symmetric, no curvature. ROM normal. No CVA tenderness. Cardio: regular rate and rhythm, S1, S2 normal, no murmur, click, rub or gallop GI: soft, non-tender; bowel sounds normal; no masses,  no organomegaly Extremities: extremities normal, atraumatic, no cyanosis or edema Neurologic: Alert and oriented X 3, normal strength and tone. Normal symmetric reflexes. Normal coordination and gait  ECOG PERFORMANCE STATUS: 1 - Symptomatic but completely ambulatory  Blood pressure (!) 154/57, pulse 86, temperature 98 F (36.7 C), temperature source Temporal, resp. rate 17, height 5' 9  (1.753 m), weight 191 lb (86.6 kg), SpO2 100%.  LABORATORY DATA: Lab Results  Component Value Date   WBC 5.0 11/29/2023   HGB 12.5 11/29/2023   HCT 37.4 11/29/2023   MCV 88.4 11/29/2023   PLT 299 11/29/2023      Chemistry      Component Value Date/Time   NA 136 10/28/2023 1332   NA 134 09/11/2022 1259   K 4.9 10/28/2023 1332   CL 104 10/28/2023 1332   CO2 26 10/28/2023 1332   BUN 26 (H) 10/28/2023 1332   BUN 12 09/11/2022 1259   CREATININE 1.05 (H) 10/28/2023 1332   CREATININE 0.98 10/30/2013 1118      Component Value Date/Time   CALCIUM  10.2 10/28/2023 1332   ALKPHOS 109 10/28/2023 1332   AST 12 (L) 10/28/2023 1332   ALT 12 10/28/2023 1332   BILITOT 0.5 10/28/2023 1332       RADIOGRAPHIC STUDIES: CT CHEST ABDOMEN PELVIS W CONTRAST Result Date: 11/25/2023 CLINICAL DATA:  Non-small-cell lung cancer restaging * Tracking Code: BO * EXAM: CT CHEST, ABDOMEN, AND PELVIS WITH CONTRAST TECHNIQUE: Multidetector CT imaging of the chest, abdomen and pelvis was performed following the standard protocol during bolus administration of intravenous contrast. RADIATION DOSE REDUCTION: This exam was performed according to the departmental dose-optimization program which includes automated exposure control, adjustment of the mA and/or kV according to patient size and/or use of iterative reconstruction technique. CONTRAST:  OMNIPAQUE  IOHEXOL  300 MG/ML  SOLN COMPARISON:  08/17/2023 FINDINGS: CT CHEST FINDINGS Cardiovascular: Aortic atherosclerosis. Aortic valve calcifications. Normal heart size. Unchanged trace pericardial effusion. Mediastinum/Nodes: No enlarged mediastinal, hilar, or axillary lymph nodes. Thyroid  gland, trachea, and esophagus demonstrate no significant findings. Lungs/Pleura: Mild centrilobular emphysema. Unchanged post treatment appearance of the left upper lobe, with a treated subpleural mass anteriorly measuring 2.7 x 0.9 cm (series 7, image 47). Unchanged nodule in the  anterior right upper lobe measuring 0.8 x 0.4 cm (series 7, image 53). No pleural effusion or pneumothorax. Musculoskeletal: No chest wall abnormality. No acute osseous findings. CT ABDOMEN PELVIS FINDINGS Hepatobiliary: No focal liver abnormality is seen. Coarse contour of the liver. Status post cholecystectomy. No biliary dilatation. Pancreas: Unremarkable. No pancreatic ductal dilatation or surrounding inflammatory changes. Spleen: Normal in size without significant abnormality. Adrenals/Urinary Tract: Benign adenomatous thickening of the adrenal glands. Kidneys are normal, without renal calculi, solid lesion, or hydronephrosis. Bladder is unremarkable. Stomach/Bowel: Stomach is within normal limits. Appendix appears normal. No evidence of bowel wall thickening, distention, or inflammatory changes. Descending and sigmoid diverticulosis. Large burden of stool throughout the colon and rectum. Vascular/Lymphatic: Aortic atherosclerosis. No enlarged abdominal or pelvic lymph nodes. Reproductive: No mass or other abnormality. Other: No abdominal wall hernia or abnormality. No ascites. Musculoskeletal: No acute osseous findings. High-grade wedge deformity T12 status post vertebral cement augmentation. IMPRESSION: 1. Unchanged post treatment appearance of the left upper  lobe, with a treated subpleural mass anteriorly measuring 2.7 x 0.9 cm. 2. Unchanged nodule in the anterior right upper lobe measuring 0.8 x 0.4 cm. 3. Coarse contour of the liver, suggestive of cirrhosis. 4. Descending and sigmoid diverticulosis without evidence of acute diverticulitis. 5. Coronary artery disease. 6. Aortic valve calcifications. Correlate for echocardiographic evidence of aortic valve dysfunction. Aortic Atherosclerosis (ICD10-I70.0) and Emphysema (ICD10-J43.9). Electronically Signed   By: Marolyn JONETTA Jaksch M.D.   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  She is currently undergoing systemic chemotherapy with carboplatin  for AUC of 5 on day 1 and etoposide  100 Mg/M2 on days 1, 2 and 3 status post 4 cycles.  This is concurrent with radiotherapy treated as limited stage disease because of the limited volume of her disease in the left lung.  She was supposed to start consolidation treatment with immunotherapy immediately after her induction phase but due to noncompliance.  She started the first cycle of her consolidation treatment with durvalumab  1500 mg IV every 4 weeks on 08/30/2023 status post 3 cycles.  She tolerated her treatment fairly well. She had repeat CT scan of the chest, abdomen and pelvis performed recently.  I personally independently reviewed the scan and discussed the result with the patient today.  Her scan showed no concerning findings for disease progression. Assessment and Plan Assessment & Plan Extensive stage small cell lung cancer with brain metastasis Extensive stage small cell lung cancer diagnosed in December 2020 with solitary brain metastasis. Currently on consolidation treatment with durvalumab  1500 mg IV every four weeks. Recent CT scan of the chest, abdomen, and pelvis shows no progression or new lesions, indicating stable disease. - Continue durvalumab  1500 mg IV every four weeks for consolidation treatment.  Early stage colon adenocarcinoma Diagnosed concurrently with small cell lung cancer. No new findings on recent imaging. - Continue routine follow-up with gastroenterologist.  Abnormal weight gain Reports significant weight gain of 20 pounds over two months, attributed to immunotherapy. Thyroid  function tests are normal, ruling out thyroid  dysfunction. Weight gain may be related to improved health  status and increased caloric intake. - Continue current treatment regimen as weight gain is not attributed to immunotherapy.  Acute sinusitis Reports sinus infection and allergy to leaf mold. Currently on doxycycline  prescribed by another physician. - Continue doxycycline  as prescribed for sinusitis.  He was advised to call immediately if she has any other concerning symptoms in the interval.  The patient voices understanding of current disease status and treatment options and is in agreement with the current care plan.  All questions were answered. The patient knows to call the clinic with any problems, questions or concerns. We can certainly see the patient much sooner if necessary.  The total time spent in the appointment was 30 minutes including review of chart and various tests results, discussions about plan of care and coordination of care plan .   Disclaimer: This note was dictated with voice recognition software. Similar sounding words can inadvertently be transcribed and may not be corrected upon review.

## 2023-12-01 ENCOUNTER — Telehealth: Payer: Self-pay | Admitting: Radiation Therapy

## 2023-12-01 NOTE — Telephone Encounter (Signed)
 I called Jasmine Long to check in and see if she is willing to get the brain MRI scan scheduled. She said not yet, but she is improving with her PT. Dr. Louis ordered a back brace for her that she has started doing PT in. She feels that she may be ready to get the scan scheduled in December. I told her that I will check back in again the end of this month.   Devere Perch R.T(R)(T) Radiation Special Procedures Lead

## 2023-12-10 DIAGNOSIS — M546 Pain in thoracic spine: Secondary | ICD-10-CM | POA: Diagnosis not present

## 2023-12-13 DIAGNOSIS — M546 Pain in thoracic spine: Secondary | ICD-10-CM | POA: Diagnosis not present

## 2023-12-15 NOTE — Progress Notes (Signed)
 Pt called [facility] requesting that her appts from 12/1 be rescheduled to later in the week as she is returning from the beach that day. Pt states that schedulers usually call her when her appts are made. NN let pt know that a message has been sent to the scheduler with her request and that she would like to be called prior to booking

## 2023-12-17 ENCOUNTER — Telehealth: Payer: Self-pay | Admitting: Internal Medicine

## 2023-12-17 ENCOUNTER — Other Ambulatory Visit: Payer: Self-pay | Admitting: Physician Assistant

## 2023-12-17 DIAGNOSIS — C3412 Malignant neoplasm of upper lobe, left bronchus or lung: Secondary | ICD-10-CM

## 2023-12-17 NOTE — Telephone Encounter (Signed)
 Rescheduled patient treatment on 12/1 since she will be out of town. Called and left a voicemail with the new date and times.

## 2023-12-27 ENCOUNTER — Inpatient Hospital Stay

## 2023-12-27 ENCOUNTER — Inpatient Hospital Stay: Admitting: Physician Assistant

## 2023-12-27 DIAGNOSIS — M546 Pain in thoracic spine: Secondary | ICD-10-CM | POA: Diagnosis not present

## 2023-12-29 ENCOUNTER — Inpatient Hospital Stay: Admitting: Internal Medicine

## 2023-12-29 ENCOUNTER — Inpatient Hospital Stay: Attending: Hematology

## 2023-12-29 ENCOUNTER — Inpatient Hospital Stay

## 2023-12-29 ENCOUNTER — Encounter: Payer: Self-pay | Admitting: Internal Medicine

## 2023-12-29 VITALS — BP 160/84 | HR 80 | Temp 97.6°F | Resp 16

## 2023-12-29 VITALS — BP 159/64 | HR 82 | Temp 96.8°F | Resp 17 | Ht 69.0 in | Wt 204.0 lb

## 2023-12-29 DIAGNOSIS — C3412 Malignant neoplasm of upper lobe, left bronchus or lung: Secondary | ICD-10-CM | POA: Diagnosis present

## 2023-12-29 DIAGNOSIS — C7931 Secondary malignant neoplasm of brain: Secondary | ICD-10-CM | POA: Insufficient documentation

## 2023-12-29 DIAGNOSIS — Z79899 Other long term (current) drug therapy: Secondary | ICD-10-CM | POA: Diagnosis not present

## 2023-12-29 DIAGNOSIS — Z79620 Long term (current) use of immunosuppressive biologic: Secondary | ICD-10-CM | POA: Insufficient documentation

## 2023-12-29 DIAGNOSIS — F17210 Nicotine dependence, cigarettes, uncomplicated: Secondary | ICD-10-CM | POA: Insufficient documentation

## 2023-12-29 DIAGNOSIS — C3400 Malignant neoplasm of unspecified main bronchus: Secondary | ICD-10-CM | POA: Diagnosis not present

## 2023-12-29 DIAGNOSIS — Z5112 Encounter for antineoplastic immunotherapy: Secondary | ICD-10-CM | POA: Diagnosis present

## 2023-12-29 LAB — CBC WITH DIFFERENTIAL (CANCER CENTER ONLY)
Abs Immature Granulocytes: 0.01 K/uL (ref 0.00–0.07)
Basophils Absolute: 0 K/uL (ref 0.0–0.1)
Basophils Relative: 1 %
Eosinophils Absolute: 0.1 K/uL (ref 0.0–0.5)
Eosinophils Relative: 3 %
HCT: 37.2 % (ref 36.0–46.0)
Hemoglobin: 12.2 g/dL (ref 12.0–15.0)
Immature Granulocytes: 0 %
Lymphocytes Relative: 17 %
Lymphs Abs: 0.9 K/uL (ref 0.7–4.0)
MCH: 29.7 pg (ref 26.0–34.0)
MCHC: 32.8 g/dL (ref 30.0–36.0)
MCV: 90.5 fL (ref 80.0–100.0)
Monocytes Absolute: 0.5 K/uL (ref 0.1–1.0)
Monocytes Relative: 9 %
Neutro Abs: 3.6 K/uL (ref 1.7–7.7)
Neutrophils Relative %: 70 %
Platelet Count: 288 K/uL (ref 150–400)
RBC: 4.11 MIL/uL (ref 3.87–5.11)
RDW: 15.8 % — ABNORMAL HIGH (ref 11.5–15.5)
WBC Count: 5.1 K/uL (ref 4.0–10.5)
nRBC: 0 % (ref 0.0–0.2)

## 2023-12-29 LAB — CMP (CANCER CENTER ONLY)
ALT: 19 U/L (ref 0–44)
AST: 22 U/L (ref 15–41)
Albumin: 4.5 g/dL (ref 3.5–5.0)
Alkaline Phosphatase: 137 U/L — ABNORMAL HIGH (ref 38–126)
Anion gap: 10 (ref 5–15)
BUN: 22 mg/dL (ref 8–23)
CO2: 25 mmol/L (ref 22–32)
Calcium: 9.6 mg/dL (ref 8.9–10.3)
Chloride: 104 mmol/L (ref 98–111)
Creatinine: 1.09 mg/dL — ABNORMAL HIGH (ref 0.44–1.00)
GFR, Estimated: 54 mL/min — ABNORMAL LOW (ref >60)
Glucose, Bld: 103 mg/dL — ABNORMAL HIGH (ref 70–99)
Potassium: 5.2 mmol/L — ABNORMAL HIGH (ref 3.5–5.1)
Sodium: 139 mmol/L (ref 135–145)
Total Bilirubin: 0.5 mg/dL (ref 0.0–1.2)
Total Protein: 7.6 g/dL (ref 6.5–8.1)

## 2023-12-29 MED ORDER — SODIUM CHLORIDE 0.9 % IV SOLN
1500.0000 mg | Freq: Once | INTRAVENOUS | Status: AC
  Administered 2023-12-29: 1500 mg via INTRAVENOUS
  Filled 2023-12-29: qty 30

## 2023-12-29 MED ORDER — SODIUM CHLORIDE 0.9 % IV SOLN: INTRAVENOUS | Status: DC

## 2023-12-29 NOTE — Progress Notes (Signed)
 [facility] [phone]   [phone]  OFFICE PROGRESS NOTE  [name], MD [address] 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    PRIOR THERAPY:  1) SRS to solitary brain metastasis under the care of Dr. Patrcia  2) systemic chemotherapy with carboplatin  for an AUC 5 and etoposide  100 mg/m on days 1, 2, and 3 IV every 3 weeks.  Imfinzi  will be added once she completes palliative radiation.  Status post 4 cycles.  This is concurrent with radiotherapy completed on April 05, 2023.  CURRENT THERAPY: Consolidation treatment with immunotherapy with Imfinzi  1500 Mg IV every 4 weeks.  First dose 08/30/2023.  Status post 4 cycles.  INTERVAL HISTORY: Jasmine Long 73 y.o. female returns to the clinic today for follow-up visit. Discussed the use of AI scribe software for clinical note transcription with the patient, who gave verbal consent to proceed.  History of Present Illness Jasmine Long is a 73 year old female with extensive stage small cell lung cancer who presents for evaluation before continuing immunotherapy.  She was diagnosed with extensive stage small cell lung cancer in December 2024, with brain metastasis. She underwent stereotactic radiosurgery for a solitary brain metastasis and received systemic chemotherapy with carboplatin  and etoposide  concurrent with radiation. She reports receiving durvalumab  1500 mg IV every four weeks as part of her ongoing treatment.  She has experienced significant weight gain  since starting immunotherapy, gaining 30 pounds, increasing from 171 to 203 pounds. She attributes this to an increased appetite, describing it as 'eating like a pig.' She is concerned about the weight gain and mentions a history of thyroid  issues, for which she was previously on levothyroxine .  No nausea, vomiting, diarrhea, headaches, chest pain, or breathing issues. She mentions having sinus problems.   MEDICAL HISTORY: Past Medical History:  Diagnosis Date   Allergy    Anxiety    Cancer (HCC)    small cell lung cancer   Complication of anesthesia    per patient she was hard to wake up   Depression    Diabetes mellitus without complication (HCC)    type 2, does not check blood sugar   Heart murmur    never has caused any problems   Hypertension    Hypothyroidism    Neuromuscular disorder (HCC)    bilateral lower extremity - numbness   Pneumonia    as a child   Thyroid  disease    Ulcer     ALLERGIES:  is allergic to other.  MEDICATIONS:  Current Outpatient Medications  Medication Sig Dispense Refill   ALPRAZolam  (XANAX ) 0.5 MG tablet Take 1 tablet by mouth twice daily as needed for anxiety 30 tablet 2   atorvastatin  (LIPITOR) 20 MG tablet Take 1 tablet by mouth once daily 90 tablet 1   buPROPion  (WELLBUTRIN  XL) 300 MG 24 hr tablet Take 1 tablet by mouth once daily 90 tablet 1   busPIRone  (BUSPAR ) 7.5 MG tablet Take 1 tablet (7.5 mg total) by mouth 2 (two) times daily. 60 tablet 1  cephALEXin  (KEFLEX ) 500 MG capsule Take 1 capsule (500 mg total) by mouth 4 (four) times daily. (Patient not taking: Reported on 11/24/2023) 28 capsule 0   clonazePAM  (KLONOPIN ) 1 MG tablet Take 1 mg by mouth daily as needed for anxiety (Sleep).     escitalopram  (LEXAPRO ) 10 MG tablet Take 1 tablet (10 mg total) by mouth daily. 90 tablet 3   levothyroxine  (SYNTHROID ) 125 MCG tablet TAKE 1 TABLET BY MOUTH ONCE DAILY BEFORE BREAKFAST 90 tablet 0   metFORMIN  (GLUCOPHAGE ) 1000 MG tablet TAKE 1  TABLET BY MOUTH TWICE DAILY WITH A MEAL 180 tablet 1   metoprolol  succinate (TOPROL -XL) 25 MG 24 hr tablet Take 1 tablet by mouth once daily 90 tablet 3   nicotine  (NICODERM CQ ) 21 mg/24hr patch Place 1 patch (21 mg total) onto the skin daily. (Patient not taking: Reported on 11/24/2023) 28 patch 3   ondansetron  (ZOFRAN ) 8 MG tablet Take 1 tablet (8 mg total) by mouth every 8 (eight) hours as needed for nausea or vomiting. 30 tablet 1   traMADol  (ULTRAM ) 50 MG tablet Take 1 tablet (50 mg total) by mouth every 6 (six) hours as needed. 20 tablet 0   valsartan -hydrochlorothiazide  (DIOVAN -HCT) 160-12.5 MG tablet Take 1 tablet by mouth once daily 90 tablet 3   No current facility-administered medications for this visit.    SURGICAL HISTORY:  Past Surgical History:  Procedure Laterality Date   BREAST SURGERY Right    lumpectomy- benign   CESAREAN SECTION  1985   CHOLECYSTECTOMY  1980   open   COLONOSCOPY WITH PROPOFOL  N/A 10/30/2022   Procedure: COLONOSCOPY WITH PROPOFOL ;  Surgeon: Rollin Dover, MD;  Location: [facility];  Service: Gastroenterology;  Laterality: N/A;   ENDOSCOPIC MUCOSAL RESECTION  10/30/2022   Procedure: ENDOSCOPIC MUCOSAL RESECTION;  Surgeon: Rollin Dover, MD;  Location: [facility];  Service: Gastroenterology;;   HEMOSTASIS CLIP PLACEMENT  10/30/2022   Procedure: HEMOSTASIS CLIP PLACEMENT;  Surgeon: Rollin Dover, MD;  Location: [facility];  Service: Gastroenterology;;   KYPHOPLASTY N/A 07/20/2023   Procedure: KYPHOPLASTY THORACIC TWELVE;  Surgeon: Louis Shove, MD;  [facility];  Service: Neurosurgery;  Laterality: N/A;   NASAL SEPTUM SURGERY     POLYPECTOMY  10/30/2022   Procedure: POLYPECTOMY;  Surgeon: Rollin Dover, MD;  Location: [facility];  Service: Gastroenterology;;   ROBLEY LIFTING INJECTION  10/30/2022   Procedure: SUBMUCOSAL LIFTING INJECTION;  Surgeon: Rollin Dover, MD;  Location: [facility];  Service: Gastroenterology;;    REVIEW OF  SYSTEMS:  A comprehensive review of systems was negative except for: Constitutional: positive for fatigue   PHYSICAL EXAMINATION: General appearance: alert, cooperative, fatigued, and no distress Head: Normocephalic, without obvious abnormality, atraumatic Neck: no adenopathy, no JVD, supple, symmetrical, trachea midline, and thyroid  not enlarged, symmetric, no tenderness/mass/nodules Lymph nodes: Cervical, supraclavicular, and axillary nodes normal. Resp: clear to auscultation bilaterally Back: symmetric, no curvature. ROM normal. No CVA tenderness. Cardio: regular rate and rhythm, S1, S2 normal, no murmur, click, rub or gallop GI: soft, non-tender; bowel sounds normal; no masses,  no organomegaly Extremities: extremities normal, atraumatic, no cyanosis or edema  ECOG PERFORMANCE STATUS: 1 - Symptomatic but completely ambulatory  Blood pressure (!) 159/64, pulse 82, temperature (!) 96.8 F (36 C), temperature source Temporal, resp. rate 17, height 5' 9 (1.753 m), weight 204 lb (92.5 kg), SpO2 100%.  LABORATORY DATA: Lab Results  Component Value Date   WBC 5.1 12/29/2023   HGB 12.2 12/29/2023   HCT 37.2 12/29/2023  MCV 90.5 12/29/2023   PLT 288 12/29/2023      Chemistry      Component Value Date/Time   NA 138 11/29/2023 1020   NA 134 09/11/2022 1259   K 4.7 11/29/2023 1020   CL 106 11/29/2023 1020   CO2 25 11/29/2023 1020   BUN 22 11/29/2023 1020   BUN 12 09/11/2022 1259   CREATININE 1.03 (H) 11/29/2023 1020   CREATININE 0.98 10/30/2013 1118      Component Value Date/Time   CALCIUM  9.8 11/29/2023 1020   ALKPHOS 135 (H) 11/29/2023 1020   AST 13 (L) 11/29/2023 1020   ALT 10 11/29/2023 1020   BILITOT 0.3 11/29/2023 1020       RADIOGRAPHIC STUDIES: No results found.    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  She is currently undergoing systemic chemotherapy with carboplatin  for AUC of 5 on day 1 and etoposide  100 Mg/M2 on days 1, 2 and 3 status post 4 cycles.  This is concurrent with radiotherapy treated as limited stage disease because of the limited volume of her disease in the left lung.  She was supposed to start consolidation treatment with immunotherapy immediately after her induction phase but due to noncompliance.  She started the first cycle of her consolidation treatment with durvalumab  1500 mg IV every 4 weeks on 08/30/2023 status post 4 cycles.  She tolerated her treatment fairly well. Assessment and Plan Assessment & Plan Extensive stage small cell lung cancer with brain metastasis, status post SRS and systemic chemotherapy, currently receiving consolidation immunotherapy Extensive stage small cell lung cancer with brain metastasis, diagnosed in December 2024. Status post stereotactic radiosurgery [facility]) to solitary brain metastasis and systemic chemotherapy with carboplatin  and etoposide  concurrent with radiation. Currently undergoing consolidation treatment with durvalumab  1500 mg IV every four weeks, post four cycles. No reported nausea, vomiting, diarrhea, headaches, chest pain, or breathing issues. Blood work remains stable. - Continue durvalumab  1500 mg IV every four weeks for two years.  Hypothyroidism Potential recurrence of hypothyroidism due to abnormal weight gain. Previous treatment with levothyroxine . Thyroid  function to be re-evaluated with TSH levels. - Rechecked TSH levels. - Will consider restarting levothyroxine  if TSH is elevated.  Abnormal weight gain Significant weight gain of 30 pounds since starting immunotherapy, from 171 to 203 pounds. Possible association with hypothyroidism or immunotherapy. - Continue to monitor weight and thyroid   function.  Essential hypertension Blood pressure slightly elevated today, possibly due to anxiety and travel stress. - Continue to monitor blood pressure. The patient was advised to call immediately if she has any concerning symptoms in the interim Pacritinib  The patient voices understanding of current disease status and treatment options and is in agreement with the current care plan.  All questions were answered. The patient knows to call the clinic with any problems, questions or concerns. We can certainly see the patient much sooner if necessary.  The total time spent in the appointment was 20 minutes including review of chart and various tests results, discussions about plan of care and coordination of care plan .   Disclaimer: This note was dictated with voice recognition software. Similar sounding words can inadvertently be transcribed and may not be corrected upon review.

## 2023-12-29 NOTE — Patient Instructions (Signed)

## 2024-01-05 ENCOUNTER — Other Ambulatory Visit: Payer: Self-pay | Admitting: Radiation Therapy

## 2024-01-05 DIAGNOSIS — C7931 Secondary malignant neoplasm of brain: Secondary | ICD-10-CM

## 2024-01-24 ENCOUNTER — Inpatient Hospital Stay

## 2024-01-24 ENCOUNTER — Inpatient Hospital Stay: Admitting: Internal Medicine

## 2024-01-24 NOTE — Progress Notes (Unsigned)
 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   PRIOR THERAPY: 1) SRS to solitary brain metastasis under the care of Dr. Patrcia 2) systemic chemotherapy with carboplatin  for an AUC 5 and etoposide  100 mg/m on days 1, 2, and 3 IV every 3 weeks.  Imfinzi  will be added once she completes palliative radiation.  Status post 4 cycles.  This is concurrent with radiotherapy completed on April 05, 2023.  CURRENT THERAPY: Consolidation treatment with immunotherapy with Imfinzi  1500 Mg IV every 4 weeks.  First dose 08/30/2023.  Status post 5 cycles.    INTERVAL HISTORY: Jasmine Long 73 y.o. female returns to the clinic today for a follow-up visit.  The patient was last seen in clinic 1 month ago by Dr. Sherrod on 12/29/2023.  She is currently undergoing consolidation immunotherapy with Imfinzi .  She is status post 5 cycles.  She is nervous and anxious today because her son is scheduled for surgery today.   She is currently receiving chemotherapy and reports feeling generally well, without fevers, chills, or night sweats. She denies significant nausea or vomiting, except for mild nausea yesterday attributed to anxiety. She has not experienced diarrhea, constipation, or rashes. She denies shortness of breath except with exertion, which she attributes to activity, and has no cough except in the context of ongoing sinus drainage.  She sustained a thoracic vertebral compression fracture after  starting chemotherapy in December, treated with vertebroplasty. She continues to have difficulty lying on her back for more than 10-15 minutes, which limits her ability to undergo certain procedures and physical therapy exercises. She is attending physical therapy, which she finds beneficial, but missed three sessions due to her son's hospitalization. She can lie on her side but not on her stomach. She is scheduled to see her neurosurgeon on 02/17/24 with Dr. Malcolm  She reports significant weight gain and is undergoing evaluation for thyroid  dysfunction, with laboratory results pending.   She has ongoing symptoms of sinus drainage and cough, which she attributes to sinus infection and allergies to leaf mold. She is taking over-the-counter guaifenesin  and has previously responded well to doxycycline  for sinus infections. She experiences headaches localized to the sinus area but denies other neurological symptoms  She is here today for evaluation and repeat blood work before undergoing cycle #6.  MEDICAL HISTORY: Past Medical History:  Diagnosis Date   Allergy    Anxiety    Cancer (HCC)    small cell lung cancer   Complication of anesthesia    per patient she was hard to wake up   Depression    Diabetes mellitus without complication (HCC)    type 2, does not check blood sugar   Heart murmur    never has caused any problems   Hypertension    Hypothyroidism    Neuromuscular disorder (HCC)    bilateral lower extremity - numbness   Pneumonia    as a child   Thyroid  disease    Ulcer     ALLERGIES:  is allergic  to other.  MEDICATIONS:  Current Outpatient Medications  Medication Sig Dispense Refill   doxycycline  (VIBRA -TABS) 100 MG tablet Take 1 tablet (100 mg total) by mouth 2 (two) times daily. 14 tablet 0   ALPRAZolam  (XANAX ) 0.5 MG tablet Take 1 tablet by mouth twice daily as needed for anxiety 30 tablet 2   atorvastatin  (LIPITOR) 20 MG tablet Take 1 tablet by mouth once daily 90  tablet 1   buPROPion  (WELLBUTRIN  XL) 300 MG 24 hr tablet Take 1 tablet by mouth once daily 90 tablet 1   busPIRone  (BUSPAR ) 7.5 MG tablet Take 1 tablet (7.5 mg total) by mouth 2 (two) times daily. 60 tablet 1   clonazePAM  (KLONOPIN ) 1 MG tablet Take 1 mg by mouth daily as needed for anxiety (Sleep).     escitalopram  (LEXAPRO ) 10 MG tablet Take 1 tablet (10 mg total) by mouth daily. 90 tablet 3   levothyroxine  (SYNTHROID ) 125 MCG tablet TAKE 1 TABLET BY MOUTH ONCE DAILY BEFORE BREAKFAST 90 tablet 0   metFORMIN  (GLUCOPHAGE ) 1000 MG tablet TAKE 1 TABLET BY MOUTH TWICE DAILY WITH A MEAL 180 tablet 1   metoprolol  succinate (TOPROL -XL) 25 MG 24 hr tablet Take 1 tablet by mouth once daily 90 tablet 3   nicotine  (NICODERM CQ ) 21 mg/24hr patch Place 1 patch (21 mg total) onto the skin daily. (Patient not taking: Reported on 11/24/2023) 28 patch 3   ondansetron  (ZOFRAN ) 8 MG tablet Take 1 tablet (8 mg total) by mouth every 8 (eight) hours as needed for nausea or vomiting. 30 tablet 1   traMADol  (ULTRAM ) 50 MG tablet Take 1 tablet (50 mg total) by mouth every 6 (six) hours as needed. 20 tablet 0   valsartan -hydrochlorothiazide  (DIOVAN -HCT) 160-12.5 MG tablet Take 1 tablet by mouth once daily 90 tablet 3   No current facility-administered medications for this visit.    SURGICAL HISTORY:  Past Surgical History:  Procedure Laterality Date   BREAST SURGERY Right    lumpectomy- benign   CESAREAN SECTION  1985   CHOLECYSTECTOMY  1980   open   COLONOSCOPY WITH PROPOFOL  N/A 10/30/2022   Procedure: COLONOSCOPY WITH PROPOFOL ;  Surgeon: Rollin Dover, MD;  Location: [facility];  Service: Gastroenterology;  Laterality: N/A;   ENDOSCOPIC MUCOSAL RESECTION  10/30/2022   Procedure: ENDOSCOPIC MUCOSAL RESECTION;  Surgeon: Rollin Dover, MD;  Location: [facility];  Service: Gastroenterology;;   HEMOSTASIS CLIP PLACEMENT  10/30/2022   Procedure: HEMOSTASIS CLIP PLACEMENT;  Surgeon: Rollin Dover, MD;  Location:  [facility];  Service: Gastroenterology;;   KYPHOPLASTY N/A 07/20/2023   Procedure: KYPHOPLASTY THORACIC TWELVE;  Surgeon: Louis Shove, MD;  Location: [facility] OR;  Service: Neurosurgery;  Laterality: N/A;   NASAL SEPTUM SURGERY     POLYPECTOMY  10/30/2022   Procedure: POLYPECTOMY;  Surgeon: Rollin Dover, MD;  Location: [facility];  Service: Gastroenterology;;   ROBLEY LIFTING INJECTION  10/30/2022   Procedure: SUBMUCOSAL LIFTING INJECTION;  Surgeon: Rollin Dover, MD;  Location: [facility];  Service: Gastroenterology;;    REVIEW OF SYSTEMS:   Review of Systems  Constitutional: Stable fatigue. Negative for appetite change, chills, fever and unexpected weight change.  HENT: Negative for mouth sores, nosebleeds, sore throat and trouble swallowing.   Eyes: Negative for eye problems and icterus.  Respiratory: Positive for intermittent cough from sinus drainage. Negative for hemoptysis, shortness of breath and wheezing.   Cardiovascular: Negative for chest pain and leg swelling.  Gastrointestinal: Negative for abdominal pain, constipation, diarrhea, nausea and vomiting (none at  this time).  Genitourinary: Negative for bladder incontinence, difficulty urinating, dysuria, frequency and hematuria.   Musculoskeletal: Positive for back pain. Negative for gait problem, neck pain and neck stiffness.  Skin: Negative for itching and rash.  Neurological: Positive for sinus headache. Negative for dizziness, extremity weakness, gait problem, light-headedness and seizures.  Hematological: Negative for adenopathy. Does not bruise/bleed easily.  Psychiatric/Behavioral: Negative for confusion, depression and sleep disturbance. The patient is not nervous/anxious.     PHYSICAL EXAMINATION:  Blood pressure 134/61, pulse 88, temperature 97.7 F (36.5 C), temperature source Temporal, resp. rate 16, height 5' 9 (1.753 m), weight 203 lb (92.1 kg), SpO2 100%.  ECOG PERFORMANCE STATUS: 1  Physical Exam   Constitutional: Oriented to person, place, and time and well-developed, well-nourished, and in no distress. HENT:  Head: Normocephalic and atraumatic.  Mouth/Throat: Oropharynx is clear and moist. No oropharyngeal exudate.  Eyes: Conjunctivae are normal. Right eye exhibits no discharge. Left eye exhibits no discharge. No scleral icterus.  Neck: Normal range of motion. Neck supple.  Cardiovascular: Normal rate, regular rhythm, normal heart sounds and intact distal pulses.   Pulmonary/Chest: Effort normal and breath sounds normal. No respiratory distress. No wheezes. No rales.  Abdominal: Soft. Bowel sounds are normal. Exhibits no distension and no mass. There is no tenderness.  Musculoskeletal: Normal range of motion. Exhibits no edema.  Lymphadenopathy:    No cervical adenopathy.  Neurological: Alert and oriented to person, place, and time. Exhibits muscle wasting. Ambulates with a cane.  Skin: Skin is warm and dry. No rash noted. Not diaphoretic. No erythema. No pallor.  Psychiatric: Mood, memory and judgment normal.  Vitals reviewed.  LABORATORY DATA: Lab Results  Component Value Date   WBC 5.7 01/26/2024   HGB 12.5 01/26/2024   HCT 37.0 01/26/2024   MCV 89.2 01/26/2024   PLT 275 01/26/2024      Chemistry      Component Value Date/Time   NA 137 01/26/2024 0828   NA 134 09/11/2022 1259   K 4.3 01/26/2024 0828   CL 103 01/26/2024 0828   CO2 22 01/26/2024 0828   BUN 27 (H) 01/26/2024 0828   BUN 12 09/11/2022 1259   CREATININE 1.05 (H) 01/26/2024 0828   CREATININE 0.98 10/30/2013 1118      Component Value Date/Time   CALCIUM  9.5 01/26/2024 0828   ALKPHOS 134 (H) 01/26/2024 0828   AST 29 01/26/2024 0828   ALT 19 01/26/2024 0828   BILITOT 0.5 01/26/2024 0828       RADIOGRAPHIC STUDIES:  No results found.   ASSESSMENT/PLAN:  This is a very pleasant 73 year old Caucasian female with extensive stage (T2b, N3, M1 B) small cell lung cancer.  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    She underwent a course of systemic chemotherapy with carboplatin  for an AUC of 5 on day 1, etoposide  100 mg/m on days 1, 2, and 3.  She is status post 4 cycles.  This was concurrent with radiation and treated as limited stage small cell lung cancer due to the limited volume of her disease in the left lung.  She started the first cycle of her consolidation treatment with durvalumab  1500 mg IV every 4 weeks on 08/30/2023 status post 5 cycles.  She tolerated her treatment fairly well.   Labs were reviewed. Recommend that she proceed with  cycle #6 today as scheduled.   I will arrange for a restaging CT scan of the CAP prior to the next cycle of treatment.   We will see her back in 4 weeks before undergoing cycle #7   Compression fracture of thoracic vertebra Thoracic vertebral compression fracture treated with vertebroplasty. Difficulty lying supine limits certain procedures and exercises. Finds physical therapy beneficial. - Encouraged her to attend her upcoming appointment with her spine specialist (Dr. Malcolm) in January. - Reinforced the importance of physical therapy and encouraged continuation as tolerated.  Her TSH and T4 is pending.   Persistent sinus drainage and cough likely due to chronic sinusitis and environmental allergies. Self-manages with guaifenesin  and previously improved with doxycycline . - Offered prescription for doxycycline  for sinusitis. - Reviewed current use of over-the-counter guaifenesin  for symptom management.    The patient was advised to call immediately if she has any concerning symptoms in the interval. The patient voices understanding of current disease status and treatment options and is in agreement with the current care plan. All questions were answered. The patient  knows to call the clinic with any problems, questions or concerns. We can certainly see the patient much sooner if necessary    Orders Placed This Encounter  Procedures   CT CHEST ABDOMEN PELVIS W CONTRAST    Standing Status:   Future    Expected Date:   02/16/2024    Expiration Date:   01/25/2025    If indicated for the ordered procedure, I authorize the administration of contrast media per Radiology protocol:   Yes    Does the patient have a contrast media/X-ray dye allergy?:   No    Preferred imaging location?:   [facility]    If indicated for the ordered procedure, I authorize the administration of oral contrast media per Radiology protocol:   Yes     The total time spent in the appointment was 20-29 minutes  Moksh Loomer L Makell Cyr, PA-C 01/26/2024

## 2024-01-26 ENCOUNTER — Inpatient Hospital Stay

## 2024-01-26 ENCOUNTER — Inpatient Hospital Stay (HOSPITAL_BASED_OUTPATIENT_CLINIC_OR_DEPARTMENT_OTHER): Admitting: Physician Assistant

## 2024-01-26 VITALS — BP 142/64 | HR 83 | Temp 98.2°F | Resp 18

## 2024-01-26 VITALS — BP 134/61 | HR 88 | Temp 97.7°F | Resp 16 | Ht 69.0 in | Wt 203.0 lb

## 2024-01-26 DIAGNOSIS — C3400 Malignant neoplasm of unspecified main bronchus: Secondary | ICD-10-CM

## 2024-01-26 DIAGNOSIS — C3412 Malignant neoplasm of upper lobe, left bronchus or lung: Secondary | ICD-10-CM

## 2024-01-26 DIAGNOSIS — Z5112 Encounter for antineoplastic immunotherapy: Secondary | ICD-10-CM | POA: Diagnosis not present

## 2024-01-26 DIAGNOSIS — J329 Chronic sinusitis, unspecified: Secondary | ICD-10-CM | POA: Diagnosis not present

## 2024-01-26 LAB — CBC WITH DIFFERENTIAL (CANCER CENTER ONLY)
Abs Immature Granulocytes: 0.02 K/uL (ref 0.00–0.07)
Basophils Absolute: 0 K/uL (ref 0.0–0.1)
Basophils Relative: 1 %
Eosinophils Absolute: 0.1 K/uL (ref 0.0–0.5)
Eosinophils Relative: 2 %
HCT: 37 % (ref 36.0–46.0)
Hemoglobin: 12.5 g/dL (ref 12.0–15.0)
Immature Granulocytes: 0 %
Lymphocytes Relative: 13 %
Lymphs Abs: 0.7 K/uL (ref 0.7–4.0)
MCH: 30.1 pg (ref 26.0–34.0)
MCHC: 33.8 g/dL (ref 30.0–36.0)
MCV: 89.2 fL (ref 80.0–100.0)
Monocytes Absolute: 0.5 K/uL (ref 0.1–1.0)
Monocytes Relative: 9 %
Neutro Abs: 4.3 K/uL (ref 1.7–7.7)
Neutrophils Relative %: 75 %
Platelet Count: 275 K/uL (ref 150–400)
RBC: 4.15 MIL/uL (ref 3.87–5.11)
RDW: 15.1 % (ref 11.5–15.5)
WBC Count: 5.7 K/uL (ref 4.0–10.5)
nRBC: 0 % (ref 0.0–0.2)

## 2024-01-26 LAB — CMP (CANCER CENTER ONLY)
ALT: 19 U/L (ref 0–44)
AST: 29 U/L (ref 15–41)
Albumin: 4.5 g/dL (ref 3.5–5.0)
Alkaline Phosphatase: 134 U/L — ABNORMAL HIGH (ref 38–126)
Anion gap: 12 (ref 5–15)
BUN: 27 mg/dL — ABNORMAL HIGH (ref 8–23)
CO2: 22 mmol/L (ref 22–32)
Calcium: 9.5 mg/dL (ref 8.9–10.3)
Chloride: 103 mmol/L (ref 98–111)
Creatinine: 1.05 mg/dL — ABNORMAL HIGH (ref 0.44–1.00)
GFR, Estimated: 57 mL/min — ABNORMAL LOW
Glucose, Bld: 117 mg/dL — ABNORMAL HIGH (ref 70–99)
Potassium: 4.3 mmol/L (ref 3.5–5.1)
Sodium: 137 mmol/L (ref 135–145)
Total Bilirubin: 0.5 mg/dL (ref 0.0–1.2)
Total Protein: 7.6 g/dL (ref 6.5–8.1)

## 2024-01-26 LAB — TSH: TSH: 26.4 u[IU]/mL — ABNORMAL HIGH (ref 0.350–4.500)

## 2024-01-26 MED ORDER — DOXYCYCLINE HYCLATE 100 MG PO TABS: 100.0000 mg | ORAL_TABLET | Freq: Two times a day (BID) | ORAL | 0 refills | Status: DC

## 2024-01-26 MED ORDER — SODIUM CHLORIDE 0.9 % IV SOLN
1500.0000 mg | Freq: Once | INTRAVENOUS | Status: AC
  Administered 2024-01-26: 1500 mg via INTRAVENOUS
  Filled 2024-01-26: qty 30

## 2024-01-26 MED ORDER — SODIUM CHLORIDE 0.9 % IV SOLN: INTRAVENOUS | Status: DC

## 2024-01-26 NOTE — Patient Instructions (Signed)

## 2024-01-27 LAB — T4: T4, Total: 4.5 ug/dL (ref 4.5–12.0)

## 2024-01-28 ENCOUNTER — Ambulatory Visit: Payer: Self-pay

## 2024-01-28 NOTE — Telephone Encounter (Addendum)
 Spoke with patient via telephone call.  Patient did confirm that she had stopped taking the Synthroid  medication x 2 months ago. Patient also confirmed that she was contacting her PCP today for a follow-up appt.    ----- Message from Cassandra L Heilingoetter sent at 01/28/2024  8:07 AM EST ----- Jasmine Long,   Can you call her and let her know her thyroid  labs are off and make sure she is taking her synthroid  daily? Let her know I will forward the results to her PCP who is managing her thyroid .   Cassie ----- Message ----- From: Sherrod Sherrod, MD Sent: 01/26/2024   5:23 PM EST To: Calton CROME Heilingoetter, PA-C

## 2024-02-01 ENCOUNTER — Ambulatory Visit (INDEPENDENT_AMBULATORY_CARE_PROVIDER_SITE_OTHER): Admitting: Emergency Medicine

## 2024-02-01 ENCOUNTER — Encounter: Payer: Self-pay | Admitting: Emergency Medicine

## 2024-02-01 VITALS — BP 128/84 | HR 87 | Temp 97.9°F | Ht 69.0 in | Wt 199.0 lb

## 2024-02-01 DIAGNOSIS — J329 Chronic sinusitis, unspecified: Secondary | ICD-10-CM | POA: Diagnosis not present

## 2024-02-01 DIAGNOSIS — C7931 Secondary malignant neoplasm of brain: Secondary | ICD-10-CM | POA: Diagnosis not present

## 2024-02-01 DIAGNOSIS — F418 Other specified anxiety disorders: Secondary | ICD-10-CM

## 2024-02-01 DIAGNOSIS — E785 Hyperlipidemia, unspecified: Secondary | ICD-10-CM

## 2024-02-01 DIAGNOSIS — E1169 Type 2 diabetes mellitus with other specified complication: Secondary | ICD-10-CM | POA: Diagnosis not present

## 2024-02-01 DIAGNOSIS — E119 Type 2 diabetes mellitus without complications: Secondary | ICD-10-CM | POA: Diagnosis not present

## 2024-02-01 DIAGNOSIS — J410 Simple chronic bronchitis: Secondary | ICD-10-CM

## 2024-02-01 DIAGNOSIS — C3412 Malignant neoplasm of upper lobe, left bronchus or lung: Secondary | ICD-10-CM | POA: Diagnosis not present

## 2024-02-01 DIAGNOSIS — I10 Essential (primary) hypertension: Secondary | ICD-10-CM | POA: Diagnosis not present

## 2024-02-01 DIAGNOSIS — E079 Disorder of thyroid, unspecified: Secondary | ICD-10-CM | POA: Diagnosis not present

## 2024-02-01 DIAGNOSIS — E039 Hypothyroidism, unspecified: Secondary | ICD-10-CM

## 2024-02-01 MED ORDER — ATORVASTATIN CALCIUM 20 MG PO TABS: 20.0000 mg | ORAL_TABLET | Freq: Every day | ORAL | 1 refills | Status: AC

## 2024-02-01 MED ORDER — BUPROPION HCL ER (XL) 300 MG PO TB24: 300.0000 mg | ORAL_TABLET | Freq: Every day | ORAL | 1 refills | Status: AC

## 2024-02-01 MED ORDER — LEVOTHYROXINE SODIUM 125 MCG PO TABS: 125.0000 ug | ORAL_TABLET | Freq: Every day | ORAL | 0 refills | Status: AC

## 2024-02-01 MED ORDER — METFORMIN HCL 1000 MG PO TABS: 1000.0000 mg | ORAL_TABLET | Freq: Two times a day (BID) | ORAL | 1 refills | Status: AC

## 2024-02-01 MED ORDER — ESCITALOPRAM OXALATE 10 MG PO TABS: 10.0000 mg | ORAL_TABLET | Freq: Every day | ORAL | 3 refills | Status: AC

## 2024-02-01 MED ORDER — VALSARTAN-HYDROCHLOROTHIAZIDE 160-12.5 MG PO TABS: 1.0000 | ORAL_TABLET | Freq: Every day | ORAL | 3 refills | Status: AC

## 2024-02-01 MED ORDER — METOPROLOL SUCCINATE ER 25 MG PO TB24: 25.0000 mg | ORAL_TABLET | Freq: Every day | ORAL | 3 refills | Status: AC

## 2024-02-01 MED ORDER — BUSPIRONE HCL 7.5 MG PO TABS: 7.5000 mg | ORAL_TABLET | Freq: Two times a day (BID) | ORAL | 1 refills | Status: AC

## 2024-02-01 NOTE — Assessment & Plan Note (Signed)
 Wt Readings from Last 3 Encounters:  02/01/24 199 lb (90.3 kg)  01/26/24 203 lb (92.1 kg)  12/29/23 204 lb (92.5 kg)   Lab Results  Component Value Date   TSH 26.400 (H) 01/26/2024  Off medication Needs to restart Synthroid  125 mcg daily

## 2024-02-01 NOTE — Patient Instructions (Signed)
 Health Maintenance After Age 72 After age 27, you are at a higher risk for certain long-term diseases and infections as well as injuries from falls. Falls are a major cause of broken bones and head injuries in people who are older than age 73. Getting regular preventive care can help to keep you healthy and well. Preventive care includes getting regular testing and making lifestyle changes as recommended by your health care provider. Talk with your health care provider about: Which screenings and tests you should have. A screening is a test that checks for a disease when you have no symptoms. A diet and exercise plan that is right for you. What should I know about screenings and tests to prevent falls? Screening and testing are the best ways to find a health problem early. Early diagnosis and treatment give you the best chance of managing medical conditions that are common after age 90. Certain conditions and lifestyle choices may make you more likely to have a fall. Your health care provider may recommend: Regular vision checks. Poor vision and conditions such as cataracts can make you more likely to have a fall. If you wear glasses, make sure to get your prescription updated if your vision changes. Medicine review. Work with your health care provider to regularly review all of the medicines you are taking, including over-the-counter medicines. Ask your health care provider about any side effects that may make you more likely to have a fall. Tell your health care provider if any medicines that you take make you feel dizzy or sleepy. Strength and balance checks. Your health care provider may recommend certain tests to check your strength and balance while standing, walking, or changing positions. Foot health exam. Foot pain and numbness, as well as not wearing proper footwear, can make you more likely to have a fall. Screenings, including: Osteoporosis screening. Osteoporosis is a condition that causes  the bones to get weaker and break more easily. Blood pressure screening. Blood pressure changes and medicines to control blood pressure can make you feel dizzy. Depression screening. You may be more likely to have a fall if you have a fear of falling, feel depressed, or feel unable to do activities that you used to do. Alcohol  use screening. Using too much alcohol  can affect your balance and may make you more likely to have a fall. Follow these instructions at home: Lifestyle Do not drink alcohol  if: Your health care provider tells you not to drink. If you drink alcohol : Limit how much you have to: 0-1 drink a day for women. 0-2 drinks a day for men. Know how much alcohol  is in your drink. In the [place] one drink equals one 12 oz bottle of beer (355 mL), one 5 oz glass of wine (148 mL), or one 1 oz glass of hard liquor (44 mL). Do not use any products that contain nicotine or tobacco. These products include cigarettes, chewing tobacco, and vaping devices, such as e-cigarettes. If you need help quitting, ask your health care provider. Activity  Follow a regular exercise program to stay fit. This will help you maintain your balance. Ask your health care provider what types of exercise are appropriate for you. If you need a cane or walker, use it as recommended by your health care provider. Wear supportive shoes that have nonskid soles. Safety  Remove any tripping hazards, such as rugs, cords, and clutter. Install safety equipment such as grab bars in bathrooms and safety rails on stairs. Keep rooms and walkways  well-lit. General instructions Talk with your health care provider about your risks for falling. Tell your health care provider if: You fall. Be sure to tell your health care provider about all falls, even ones that seem minor. You feel dizzy, tiredness (fatigue), or off-balance. Take over-the-counter and prescription medicines only as told by your health care provider. These include  supplements. Eat a healthy diet and maintain a healthy weight. A healthy diet includes low-fat dairy products, low-fat (lean) meats, and fiber from whole grains, beans, and lots of fruits and vegetables. Stay current with your vaccines. Schedule regular health, dental, and eye exams. Summary Having a healthy lifestyle and getting preventive care can help to protect your health and wellness after age 15. Screening and testing are the best way to find a health problem early and help you avoid having a fall. Early diagnosis and treatment give you the best chance for managing medical conditions that are more common for people who are older than age 42. Falls are a major cause of broken bones and head injuries in people who are older than age 64. Take precautions to prevent a fall at home. Work with your health care provider to learn what changes you can make to improve your health and wellness and to prevent falls. This information is not intended to replace advice given to you by your health care provider. Make sure you discuss any questions you have with your health care provider. Document Revised: 06/03/2020 Document Reviewed: 06/03/2020 [employer] Patient Education  2024 [employer].

## 2024-02-01 NOTE — Assessment & Plan Note (Signed)
 Clinically stable.  Much improved.  No concerns

## 2024-02-01 NOTE — Assessment & Plan Note (Signed)
Chronic but stable Continues BuSpar 7.5 mg twice a day and Lexapro 10 mg daily Uses alprazolam as needed

## 2024-02-01 NOTE — Assessment & Plan Note (Signed)
 Chronic stable conditions Presently not taking metformin  or atorvastatin 

## 2024-02-01 NOTE — Progress Notes (Signed)
 Long Jasmine Long 74 y.o.   Chief Complaint  Patient presents with   Medication Refill    HISTORY OF PRESENT ILLNESS: This is a 74 y.o. female here for follow-up of multiple chronic medical conditions I am medication refill Overall doing well.  Responding well to cancer treatment. Gained 30 pounds in the last 3 months.  Not taking thyroid  medication. Lab Results  Component Value Date   TSH 26.400 (H) 01/26/2024    Has no complaints or any other medical concerns today.  Medication Refill Pertinent negatives include no abdominal pain, chest pain, chills, congestion, coughing, fever, headaches, nausea, rash, sore throat or vomiting.     Prior to Admission medications  Medication Sig Start Date End Date Taking? Authorizing Provider  ALPRAZolam  (XANAX ) 0.5 MG tablet Take 1 tablet by mouth twice daily as needed for anxiety 09/01/23   [name], MD  atorvastatin  (LIPITOR) 20 MG tablet Take 1 tablet by mouth once daily 08/18/22   Lyam Provencio Jose, MD  buPROPion  (WELLBUTRIN  XL) 300 MG 24 hr tablet Take 1 tablet by mouth once daily 08/18/22   Messiah Rovira Jose, MD  busPIRone  (BUSPAR ) 7.5 MG tablet Take 1 tablet (7.5 mg total) by mouth 2 (two) times daily. 07/16/22   [name], MD  clonazePAM  (KLONOPIN ) 1 MG tablet Take 1 mg by mouth daily as needed for anxiety (Sleep).    [provider]  doxycycline  (VIBRA -TABS) 100 MG tablet Take 1 tablet (100 mg total) by mouth 2 (two) times daily. 01/26/24   Heilingoetter, Cassandra L, PA-C  escitalopram  (LEXAPRO ) 10 MG tablet Take 1 tablet (10 mg total) by mouth daily. 12/25/21 09/06/23  [name], MD  levothyroxine  (SYNTHROID ) 125 MCG tablet TAKE 1 TABLET BY MOUTH ONCE DAILY BEFORE BREAKFAST 04/14/22   [name], MD  metFORMIN  (GLUCOPHAGE ) 1000 MG tablet TAKE 1 TABLET BY MOUTH TWICE DAILY WITH A MEAL 08/18/22   Johnross Nabozny Jose, MD  metoprolol  succinate (TOPROL -XL) 25 MG 24 hr tablet Take 1  tablet by mouth once daily 05/27/22   Shaquella Stamant Jose, MD  nicotine  (NICODERM CQ ) 21 mg/24hr patch Place 1 patch (21 mg total) onto the skin daily. Patient not taking: Reported on 11/24/2023 11/26/22   [name], MD  ondansetron  (ZOFRAN ) 8 MG tablet Take 1 tablet (8 mg total) by mouth every 8 (eight) hours as needed for nausea or vomiting. 01/15/23   Bruning, Ashlyn, PA-C  traMADol  (ULTRAM ) 50 MG tablet Take 1 tablet (50 mg total) by mouth every 6 (six) hours as needed. 07/20/23 07/19/24  Louis Shove, MD  valsartan -hydrochlorothiazide  (DIOVAN -HCT) 160-12.5 MG tablet Take 1 tablet by mouth once daily 05/27/22   [name], MD    Allergies[1]  Patient Active Problem List   Diagnosis Date Noted   Pain in thoracic spine 09/08/2023   Encounter for antineoplastic immunotherapy 05/17/2023   Goals of care, counseling/discussion 05/17/2023   Encounter for antineoplastic chemotherapy 02/23/2023   Metastasis to brain [facility]) 02/23/2023   Dysuria 02/06/2023   Screening for colon cancer 12/09/2022   Malignant neoplasm of ascending colon (HCC) 12/09/2022   Small cell lung cancer (HCC) 12/09/2022   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   Weight loss 05/13/2022   Change in multiple pigmented skin lesions 04/22/2022   Paresthesia of both lower extremities 04/22/2022   Abnormal MRI, lumbar spine 04/22/2022   Vitreous floaters of right eye 04/07/2022   Hematoma of scalp 12/25/2021   Smokers' cough (HCC) 12/26/2020  Chronic bilateral low back pain with left-sided sciatica 12/26/2020   Chronic sinus complaints 12/26/2020   Chronic anxiety 06/28/2019   History of peptic ulcer 04/30/2017   Hyperlipidemia 12/11/2015   Insomnia 12/11/2015   Panic attack as reaction to stress 12/11/2015   Current smoker 12/11/2015   Chronic sinusitis 12/11/2015   Dyslipidemia associated with type 2 diabetes mellitus (HCC) 05/15/2015   Hypothyroid 04/29/2011   Depression with anxiety 04/29/2011    GERD (gastroesophageal reflux disease) 04/29/2011   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   Nicotine  addiction 04/29/2011   Essential hypertension 04/29/2011   Heart murmur 04/29/2011    Past Medical History:  Diagnosis Date   Allergy    Anxiety    Cancer (HCC)    small cell lung cancer   Complication of anesthesia    per patient she was hard to wake up   Depression    Diabetes mellitus without complication (HCC)    type 2, does not check blood sugar   Heart murmur    never has caused any problems   Hypertension    Hypothyroidism    Neuromuscular disorder (HCC)    bilateral lower extremity - numbness   Pneumonia    as a child   Thyroid  disease    Ulcer     Past Surgical History:  Procedure Laterality Date   BREAST SURGERY Right    lumpectomy- benign   CESAREAN SECTION  1985   CHOLECYSTECTOMY  1980   open   COLONOSCOPY WITH PROPOFOL  N/A 10/30/2022   Procedure: COLONOSCOPY WITH PROPOFOL ;  Surgeon: Rollin Dover, MD;  Location: [facility];  Service: Gastroenterology;  Laterality: N/A;   ENDOSCOPIC MUCOSAL RESECTION  10/30/2022   Procedure: ENDOSCOPIC MUCOSAL RESECTION;  Surgeon: Rollin Dover, MD;  Location: [facility];  Service: Gastroenterology;;   HEMOSTASIS CLIP PLACEMENT  10/30/2022   Procedure: HEMOSTASIS CLIP PLACEMENT;  Surgeon: Rollin Dover, MD;  Location: [facility];  Service: Gastroenterology;;   KYPHOPLASTY N/A 07/20/2023   Procedure: KYPHOPLASTY THORACIC TWELVE;  Surgeon: Louis Shove, MD;  Location: [facility] OR;  Service: Neurosurgery;  Laterality: N/A;   NASAL SEPTUM SURGERY     POLYPECTOMY  10/30/2022   Procedure: POLYPECTOMY;  Surgeon: Rollin Dover, MD;  Location: [facility];  Service: Gastroenterology;;   ROBLEY LIFTING INJECTION  10/30/2022   Procedure: SUBMUCOSAL LIFTING INJECTION;  Surgeon: Rollin Dover, MD;  Location: [facility];  Service: Gastroenterology;;    Social History   Socioeconomic History   Marital status: Divorced    Spouse  name: Not on file   Number of children: 2   Years of education: Not on file   Highest education level: Bachelor's degree (e.g., BA, AB, BS)  Occupational History   Occupation: Unemployed  Tobacco Use   Smoking status: Former    Current packs/day: 0.00    Average packs/day: 1.5 packs/day for 40.0 years (60.0 ttl pk-yrs)    Types: Cigarettes    Start date: 01/20/1972    Quit date: 01/20/2012    Years since quitting: 12.0   Smokeless tobacco: Never   Tobacco comments:    Hx of E-cigarette use as of 01/20/12.  No longer using E-cig per pt as of 07/15/23.  Vaping Use   Vaping status: Former   Substances: Nicotine , Flavoring  Substance and Sexual Activity   Alcohol use: No   Drug use: No   Sexual activity: Not Currently    Birth control/protection: Post-menopausal  Other Topics Concern   Not on file  Social History Narrative  Patient on worker's compensation for an injury sustained on the job 5 years ago.      Right handed and Left Handed       Lives in a two story home       One son deceased.    Social Drivers of Health   Tobacco Use: Medium Risk (02/01/2024)   Patient History    Smoking Tobacco Use: Former    Smokeless Tobacco Use: Never    Passive Exposure: Not on file  Financial Resource Strain: Low Risk (09/06/2023)   Overall Financial Resource Strain (CARDIA)    Difficulty of Paying Living Expenses: Not hard at all  Food Insecurity: No Food Insecurity (09/06/2023)   Epic    Worried About Programme Researcher, Broadcasting/film/video in the Last Year: Never true    Ran Out of Food in the Last Year: Never true  Transportation Needs: No Transportation Needs (09/06/2023)   Epic    Lack of Transportation (Medical): No    Lack of Transportation (Non-Medical): No  Physical Activity: Inactive (09/06/2023)   Exercise Vital Sign    Days of Exercise per Week: 0 days    Minutes of Exercise per Session: 0 min  Stress: Stress Concern Present (09/06/2023)   [employer] of Occupational Health -  Occupational Stress Questionnaire    Feeling of Stress: Very much  Social Connections: Socially Isolated (09/06/2023)   Social Connection and Isolation Panel    Frequency of Communication with Friends and Family: Twice a week    Frequency of Social Gatherings with Friends and Family: Never    Attends Religious Services: Never    Database Administrator or Organizations: No    Attends Banker Meetings: Never    Marital Status: Divorced  Catering Manager Violence: Not At Risk (09/06/2023)   Epic    Fear of Current or Ex-Partner: No    Emotionally Abused: No    Physically Abused: No    Sexually Abused: No  Depression (PHQ2-9): Low Risk (02/01/2024)   Depression (PHQ2-9)    PHQ-2 Score: 0  Alcohol Screen: Low Risk (01/12/2023)   Alcohol Screen    Last Alcohol Screening Score (AUDIT): 0  Housing: Low Risk (10/27/2023)   Epic    Unable to Pay for Housing in the Last Year: No    Number of Times Moved in the Last Year: 0    Homeless in the Last Year: No  Utilities: Not At Risk (09/06/2023)   Epic    Threatened with loss of utilities: No  Health Literacy: Adequate Health Literacy (09/06/2023)   B1300 Health Literacy    Frequency of need for help with medical instructions: Never    Family History  Problem Relation Age of Onset   Lung cancer Mother 72   Dementia Father    Atrial fibrillation Father    Cancer Paternal Aunt        breast cancer   Colon cancer Paternal Aunt    Colon cancer Cousin      Review of Systems  Constitutional: Negative.  Negative for chills and fever.  HENT: Negative.  Negative for congestion and sore throat.   Respiratory: Negative.  Negative for cough and shortness of breath.   Cardiovascular: Negative.  Negative for chest pain and palpitations.  Gastrointestinal:  Negative for abdominal pain, diarrhea, nausea and vomiting.  Genitourinary: Negative.  Negative for dysuria and hematuria.  Skin: Negative.  Negative for rash.  Neurological:  Negative.  Negative for dizziness and headaches.  All other systems reviewed and are negative.   Vitals:   02/01/24 1334  BP: 128/84  Pulse: 87  Temp: 97.9 F (36.6 C)  SpO2: 98%    Physical Exam Vitals reviewed.  Constitutional:      Appearance: Normal appearance.  HENT:     Head: Normocephalic.     Mouth/Throat:     Mouth: Mucous membranes are moist.     Pharynx: Oropharynx is clear.  Eyes:     Extraocular Movements: Extraocular movements intact.     Conjunctiva/sclera: Conjunctivae normal.     Pupils: Pupils are equal, round, and reactive to light.  Cardiovascular:     Rate and Rhythm: Normal rate and regular rhythm.     Pulses: Normal pulses.     Heart sounds: Murmur heard.  Pulmonary:     Effort: Pulmonary effort is normal.     Breath sounds: Normal breath sounds.  Abdominal:     Palpations: Abdomen is soft.     Tenderness: There is no abdominal tenderness.  Musculoskeletal:     Cervical back: No tenderness.  Lymphadenopathy:     Cervical: No cervical adenopathy.  Skin:    General: Skin is warm and dry.  Neurological:     General: No focal deficit present.     Mental Status: She is alert and oriented to person, place, and time.  Psychiatric:        Mood and Affect: Mood normal.        Behavior: Behavior normal.      ASSESSMENT & PLAN: A total of 43 minutes was spent with the patient and counseling/coordination of care regarding preparing for this visit, review of most recent office visit notes, review of multiple chronic medical conditions and their management, review of all medications, review of most recent bloodwork results, review of health maintenance items, education on nutrition, prognosis, documentation, and need for follow up.   Problem List Items Addressed This Visit       Cardiovascular and Mediastinum   Essential hypertension   BP Readings from Last 3 Encounters:  02/01/24 128/84  01/26/24 (!) 142/64  01/26/24 134/61  Well-controlled.   Presently off medication         Relevant Medications   atorvastatin  (LIPITOR) 20 MG tablet   metoprolol  succinate (TOPROL -XL) 25 MG 24 hr tablet   valsartan -hydrochlorothiazide  (DIOVAN -HCT) 160-12.5 MG tablet     Respiratory   Chronic sinusitis   Presently on short course of doxycycline  Much improved Symptom management discussed Recommend saline lavages frequently during the day      Smokers' cough (HCC)   No longer smoking but continues with chronic cough      Small cell lung cancer (HCC)   Clinically stable. Undergoing immunotherapy after finishing chemotherapy Had radiation therapy as well. Responding well to treatment No complications.        Endocrine   Hypothyroid   Wt Readings from Last 3 Encounters:  02/01/24 199 lb (90.3 kg)  01/26/24 203 lb (92.1 kg)  12/29/23 204 lb (92.5 kg)   Lab Results  Component Value Date   TSH 26.400 (H) 01/26/2024  Off medication Needs to restart Synthroid  125 mcg daily        Relevant Medications   levothyroxine  (SYNTHROID ) 125 MCG tablet   metoprolol  succinate (TOPROL -XL) 25 MG 24 hr tablet   Dyslipidemia associated with type 2 diabetes mellitus (HCC)   Chronic stable conditions Presently not taking metformin  or atorvastatin       Relevant Medications  atorvastatin  (LIPITOR) 20 MG tablet   metFORMIN  (GLUCOPHAGE ) 1000 MG tablet   valsartan -hydrochlorothiazide  (DIOVAN -HCT) 160-12.5 MG tablet     Nervous and Auditory   Metastasis to brain (HCC)   Clinically stable.  Much improved.  No concerns        Other   Depression with anxiety   Chronic but stable Continues BuSpar  7.5 mg twice a day and Lexapro  10 mg daily Uses alprazolam  as needed      Relevant Medications   buPROPion  (WELLBUTRIN  XL) 300 MG 24 hr tablet   busPIRone  (BUSPAR ) 7.5 MG tablet   escitalopram  (LEXAPRO ) 10 MG tablet   Hyperlipidemia   Relevant Medications   atorvastatin  (LIPITOR) 20 MG tablet   metoprolol  succinate (TOPROL -XL) 25 MG 24  hr tablet   valsartan -hydrochlorothiazide  (DIOVAN -HCT) 160-12.5 MG tablet   Other Visit Diagnoses       Thyroid  disease    -  Primary   Relevant Medications   levothyroxine  (SYNTHROID ) 125 MCG tablet   metoprolol  succinate (TOPROL -XL) 25 MG 24 hr tablet     Type 2 diabetes mellitus without complication, without long-term current use of insulin (HCC)       Relevant Medications   atorvastatin  (LIPITOR) 20 MG tablet   metFORMIN  (GLUCOPHAGE ) 1000 MG tablet   valsartan -hydrochlorothiazide  (DIOVAN -HCT) 160-12.5 MG tablet      Patient Instructions  Health Maintenance After Age 67 After age 14, you are at a higher risk for certain long-term diseases and infections as well as injuries from falls. Falls are a major cause of broken bones and head injuries in people who are older than age 30. Getting regular preventive care can help to keep you healthy and well. Preventive care includes getting regular testing and making lifestyle changes as recommended by your health care provider. Talk with your health care provider about: Which screenings and tests you should have. A screening is a test that checks for a disease when you have no symptoms. A diet and exercise plan that is right for you. What should I know about screenings and tests to prevent falls? Screening and testing are the best ways to find a health problem early. Early diagnosis and treatment give you the best chance of managing medical conditions that are common after age 31. Certain conditions and lifestyle choices may make you more likely to have a fall. Your health care provider may recommend: Regular vision checks. Poor vision and conditions such as cataracts can make you more likely to have a fall. If you wear glasses, make sure to get your prescription updated if your vision changes. Medicine review. Work with your health care provider to regularly review all of the medicines you are taking, including over-the-counter medicines. Ask your  health care provider about any side effects that may make you more likely to have a fall. Tell your health care provider if any medicines that you take make you feel dizzy or sleepy. Strength and balance checks. Your health care provider may recommend certain tests to check your strength and balance while standing, walking, or changing positions. Foot health exam. Foot pain and numbness, as well as not wearing proper footwear, can make you more likely to have a fall. Screenings, including: Osteoporosis screening. Osteoporosis is a condition that causes the bones to get weaker and break more easily. Blood pressure screening. Blood pressure changes and medicines to control blood pressure can make you feel dizzy. Depression screening. You may be more likely to have a fall if you have a fear  of falling, feel depressed, or feel unable to do activities that you used to do. Alcohol use screening. Using too much alcohol can affect your balance and may make you more likely to have a fall. Follow these instructions at home: Lifestyle Do not drink alcohol if: Your health care provider tells you not to drink. If you drink alcohol: Limit how much you have to: 0-1 drink a day for women. 0-2 drinks a day for men. Know how much alcohol is in your drink. In the [place] one drink equals one 12 oz bottle of beer (355 mL), one 5 oz glass of wine (148 mL), or one 1 oz glass of hard liquor (44 mL). Do not use any products that contain nicotine  or tobacco. These products include cigarettes, chewing tobacco, and vaping devices, such as e-cigarettes. If you need help quitting, ask your health care provider. Activity  Follow a regular exercise program to stay fit. This will help you maintain your balance. Ask your health care provider what types of exercise are appropriate for you. If you need a cane or walker, use it as recommended by your health care provider. Wear supportive shoes that have nonskid  soles. Safety  Remove any tripping hazards, such as rugs, cords, and clutter. Install safety equipment such as grab bars in bathrooms and safety rails on stairs. Keep rooms and walkways well-lit. General instructions Talk with your health care provider about your risks for falling. Tell your health care provider if: You fall. Be sure to tell your health care provider about all falls, even ones that seem minor. You feel dizzy, tiredness (fatigue), or off-balance. Take over-the-counter and prescription medicines only as told by your health care provider. These include supplements. Eat a healthy diet and maintain a healthy weight. A healthy diet includes low-fat dairy products, low-fat (lean) meats, and fiber from whole grains, beans, and lots of fruits and vegetables. Stay current with your vaccines. Schedule regular health, dental, and eye exams. Summary Having a healthy lifestyle and getting preventive care can help to protect your health and wellness after age 62. Screening and testing are the best way to find a health problem early and help you avoid having a fall. Early diagnosis and treatment give you the best chance for managing medical conditions that are more common for people who are older than age 70. Falls are a major cause of broken bones and head injuries in people who are older than age 69. Take precautions to prevent a fall at home. Work with your health care provider to learn what changes you can make to improve your health and wellness and to prevent falls. This information is not intended to replace advice given to you by your health care provider. Make sure you discuss any questions you have with your health care provider. Document Revised: 06/03/2020 Document Reviewed: 06/03/2020 Elsevier Patient Education  2024 Elsevier Inc.    Emil Schaumann, MD  Primary [facility] at [facility]    [1]  Allergies Allergen Reactions   Other Other (See Comments)    Year long  allergies

## 2024-02-01 NOTE — Assessment & Plan Note (Signed)
 Clinically stable. Undergoing immunotherapy after finishing chemotherapy Had radiation therapy as well. Responding well to treatment No complications.

## 2024-02-01 NOTE — Assessment & Plan Note (Signed)
 Presently on short course of doxycycline  Much improved Symptom management discussed Recommend saline lavages frequently during the day

## 2024-02-01 NOTE — Assessment & Plan Note (Signed)
 BP Readings from Last 3 Encounters:  02/01/24 128/84  01/26/24 (!) 142/64  01/26/24 134/61  Well-controlled.  Presently off medication

## 2024-02-01 NOTE — Assessment & Plan Note (Signed)
 No longer smoking but continues with chronic cough

## 2024-02-07 ENCOUNTER — Encounter: Payer: Self-pay | Admitting: Internal Medicine

## 2024-02-07 NOTE — Progress Notes (Signed)
 NN was able to reach pt at mobile number. Pt had left a VM on 1/5 requesting her upcoming CT be scheduled. NN offered to call pt back with date/time. Pt states she will look for the appt in MyChart. Pt did request that the appt be in the afternoon if possible. NN arranged for CT C/A/P be on 1/19 at 4pm.

## 2024-02-10 ENCOUNTER — Other Ambulatory Visit: Payer: Self-pay

## 2024-02-10 ENCOUNTER — Observation Stay (HOSPITAL_COMMUNITY)
Admission: EM | Admit: 2024-02-10 | Discharge: 2024-02-12 | Disposition: A | Discharge status: Skilled Nursing Facility | Attending: Internal Medicine | Admitting: Internal Medicine

## 2024-02-10 ENCOUNTER — Encounter (HOSPITAL_COMMUNITY): Payer: Self-pay | Admitting: Internal Medicine

## 2024-02-10 ENCOUNTER — Emergency Department (HOSPITAL_COMMUNITY)

## 2024-02-10 DIAGNOSIS — E119 Type 2 diabetes mellitus without complications: Secondary | ICD-10-CM | POA: Insufficient documentation

## 2024-02-10 DIAGNOSIS — F17220 Nicotine dependence, chewing tobacco, uncomplicated: Secondary | ICD-10-CM | POA: Diagnosis not present

## 2024-02-10 DIAGNOSIS — Z7982 Long term (current) use of aspirin: Secondary | ICD-10-CM | POA: Diagnosis not present

## 2024-02-10 DIAGNOSIS — Z716 Tobacco abuse counseling: Secondary | ICD-10-CM | POA: Diagnosis not present

## 2024-02-10 DIAGNOSIS — I409 Acute myocarditis, unspecified: Secondary | ICD-10-CM | POA: Diagnosis not present

## 2024-02-10 DIAGNOSIS — J019 Acute sinusitis, unspecified: Secondary | ICD-10-CM | POA: Insufficient documentation

## 2024-02-10 DIAGNOSIS — I214 Non-ST elevation (NSTEMI) myocardial infarction: Secondary | ICD-10-CM | POA: Diagnosis not present

## 2024-02-10 DIAGNOSIS — C3490 Malignant neoplasm of unspecified part of unspecified bronchus or lung: Secondary | ICD-10-CM | POA: Diagnosis present

## 2024-02-10 DIAGNOSIS — R011 Cardiac murmur, unspecified: Secondary | ICD-10-CM | POA: Diagnosis not present

## 2024-02-10 DIAGNOSIS — I350 Nonrheumatic aortic (valve) stenosis: Secondary | ICD-10-CM

## 2024-02-10 DIAGNOSIS — F17210 Nicotine dependence, cigarettes, uncomplicated: Secondary | ICD-10-CM

## 2024-02-10 DIAGNOSIS — R0789 Other chest pain: Secondary | ICD-10-CM | POA: Diagnosis not present

## 2024-02-10 DIAGNOSIS — E039 Hypothyroidism, unspecified: Secondary | ICD-10-CM | POA: Diagnosis not present

## 2024-02-10 DIAGNOSIS — R2681 Unsteadiness on feet: Secondary | ICD-10-CM | POA: Diagnosis not present

## 2024-02-10 DIAGNOSIS — I4891 Unspecified atrial fibrillation: Secondary | ICD-10-CM | POA: Insufficient documentation

## 2024-02-10 DIAGNOSIS — Z79899 Other long term (current) drug therapy: Secondary | ICD-10-CM | POA: Diagnosis not present

## 2024-02-10 DIAGNOSIS — F4323 Adjustment disorder with mixed anxiety and depressed mood: Secondary | ICD-10-CM

## 2024-02-10 DIAGNOSIS — J209 Acute bronchitis, unspecified: Secondary | ICD-10-CM | POA: Insufficient documentation

## 2024-02-10 DIAGNOSIS — E1169 Type 2 diabetes mellitus with other specified complication: Secondary | ICD-10-CM | POA: Diagnosis present

## 2024-02-10 DIAGNOSIS — I10 Essential (primary) hypertension: Secondary | ICD-10-CM | POA: Diagnosis not present

## 2024-02-10 DIAGNOSIS — R7989 Other specified abnormal findings of blood chemistry: Secondary | ICD-10-CM

## 2024-02-10 DIAGNOSIS — E079 Disorder of thyroid, unspecified: Secondary | ICD-10-CM

## 2024-02-10 DIAGNOSIS — F418 Other specified anxiety disorders: Secondary | ICD-10-CM

## 2024-02-10 DIAGNOSIS — C3412 Malignant neoplasm of upper lobe, left bronchus or lung: Secondary | ICD-10-CM

## 2024-02-10 DIAGNOSIS — M6281 Muscle weakness (generalized): Secondary | ICD-10-CM | POA: Insufficient documentation

## 2024-02-10 DIAGNOSIS — E785 Hyperlipidemia, unspecified: Secondary | ICD-10-CM | POA: Diagnosis present

## 2024-02-10 DIAGNOSIS — F17200 Nicotine dependence, unspecified, uncomplicated: Secondary | ICD-10-CM | POA: Diagnosis present

## 2024-02-10 DIAGNOSIS — R079 Chest pain, unspecified: Principal | ICD-10-CM | POA: Diagnosis present

## 2024-02-10 LAB — CBC
HCT: 39.2 % (ref 36.0–46.0)
Hemoglobin: 12.5 g/dL (ref 12.0–15.0)
MCH: 30.1 pg (ref 26.0–34.0)
MCHC: 31.9 g/dL (ref 30.0–36.0)
MCV: 94.5 fL (ref 80.0–100.0)
Platelets: 291 K/uL (ref 150–400)
RBC: 4.15 MIL/uL (ref 3.87–5.11)
RDW: 14.7 % (ref 11.5–15.5)
WBC: 9.2 K/uL (ref 4.0–10.5)
nRBC: 0 % (ref 0.0–0.2)

## 2024-02-10 LAB — I-STAT CHEM 8, ED
BUN: 24 mg/dL — ABNORMAL HIGH (ref 8–23)
Calcium, Ion: 1.04 mmol/L — ABNORMAL LOW (ref 1.15–1.40)
Chloride: 109 mmol/L (ref 98–111)
Creatinine, Ser: 1 mg/dL (ref 0.44–1.00)
Glucose, Bld: 121 mg/dL — ABNORMAL HIGH (ref 70–99)
HCT: 37 % (ref 36.0–46.0)
Hemoglobin: 12.6 g/dL (ref 12.0–15.0)
Potassium: 4.4 mmol/L (ref 3.5–5.1)
Sodium: 137 mmol/L (ref 135–145)
TCO2: 21 mmol/L — ABNORMAL LOW (ref 22–32)

## 2024-02-10 LAB — BASIC METABOLIC PANEL WITH GFR
Anion gap: 13 (ref 5–15)
BUN: 23 mg/dL (ref 8–23)
CO2: 19 mmol/L — ABNORMAL LOW (ref 22–32)
Calcium: 9.7 mg/dL (ref 8.9–10.3)
Chloride: 104 mmol/L (ref 98–111)
Creatinine, Ser: 0.99 mg/dL (ref 0.44–1.00)
GFR, Estimated: >60 mL/min
Glucose, Bld: 120 mg/dL — ABNORMAL HIGH (ref 70–99)
Potassium: 4.8 mmol/L (ref 3.5–5.1)
Sodium: 137 mmol/L (ref 135–145)

## 2024-02-10 LAB — TROPONIN T, HIGH SENSITIVITY
Troponin T High Sensitivity: 51 ng/L — ABNORMAL HIGH (ref 0–19)
Troponin T High Sensitivity: 46 ng/L — ABNORMAL HIGH (ref 0–19)

## 2024-02-10 LAB — D-DIMER, QUANTITATIVE: D-Dimer, Quant: 0.62 ug{FEU}/mL — ABNORMAL HIGH (ref 0.00–0.50)

## 2024-02-10 MED ORDER — ACETAMINOPHEN 325 MG PO TABS: 650.0000 mg | ORAL_TABLET | Freq: Four times a day (QID) | ORAL | Status: DC | PRN

## 2024-02-10 MED ORDER — FAMOTIDINE 20 MG PO TABS
20.0000 mg | ORAL_TABLET | Freq: Every day | ORAL | Status: DC
  Administered 2024-02-10 – 2024-02-12 (×3): 20 mg via ORAL
  Filled 2024-02-10 (×3): qty 1

## 2024-02-10 MED ORDER — ONDANSETRON HCL 4 MG/2ML IJ SOLN: 4.0000 mg | Freq: Four times a day (QID) | INTRAMUSCULAR | Status: DC | PRN

## 2024-02-10 MED ORDER — ONDANSETRON HCL 4 MG PO TABS: 4.0000 mg | ORAL_TABLET | Freq: Four times a day (QID) | ORAL | Status: DC | PRN

## 2024-02-10 MED ORDER — TIROFIBAN HCL IN NACL 5-0.9 MG/100ML-% IV SOLN
0.1500 ug/kg/min | INTRAVENOUS | Status: DC
  Administered 2024-02-11 (×2): 0.15 ug/kg/min via INTRAVENOUS
  Filled 2024-02-10 (×3): qty 100

## 2024-02-10 MED ORDER — NITROGLYCERIN 0.4 MG SL SUBL
0.4000 mg | SUBLINGUAL_TABLET | SUBLINGUAL | Status: DC | PRN
  Administered 2024-02-10 (×3): 0.4 mg via SUBLINGUAL
  Filled 2024-02-10: qty 1

## 2024-02-10 MED ORDER — LEVOTHYROXINE SODIUM 75 MCG PO TABS
125.0000 ug | ORAL_TABLET | Freq: Every day | ORAL | Status: DC
  Administered 2024-02-11 – 2024-02-12 (×2): 125 ug via ORAL
  Filled 2024-02-10 (×2): qty 1

## 2024-02-10 MED ORDER — LIDOCAINE VISCOUS HCL 2 % MT SOLN: 15.0000 mL | Freq: Once | OROMUCOSAL | Status: DC

## 2024-02-10 MED ORDER — ATORVASTATIN CALCIUM 10 MG PO TABS
20.0000 mg | ORAL_TABLET | Freq: Every day | ORAL | Status: DC
  Administered 2024-02-10 – 2024-02-12 (×3): 20 mg via ORAL
  Filled 2024-02-10 (×3): qty 2

## 2024-02-10 MED ORDER — HEPARIN BOLUS VIA INFUSION
4000.0000 [IU] | Freq: Once | INTRAVENOUS | Status: AC
  Administered 2024-02-10: 4000 [IU] via INTRAVENOUS
  Filled 2024-02-10: qty 4000

## 2024-02-10 MED ORDER — ALUM & MAG HYDROXIDE-SIMETH 200-200-20 MG/5ML PO SUSP: 30.0000 mL | Freq: Once | ORAL | Status: DC

## 2024-02-10 MED ORDER — ACETAMINOPHEN 650 MG RE SUPP: 650.0000 mg | Freq: Four times a day (QID) | RECTAL | Status: DC | PRN

## 2024-02-10 MED ORDER — ALPRAZOLAM 0.5 MG PO TABS
0.5000 mg | ORAL_TABLET | Freq: Two times a day (BID) | ORAL | Status: DC | PRN
  Filled 2024-02-10 (×2): qty 1

## 2024-02-10 MED ORDER — BISACODYL 5 MG PO TBEC: 5.0000 mg | DELAYED_RELEASE_TABLET | Freq: Every day | ORAL | Status: DC | PRN

## 2024-02-10 MED ORDER — METOPROLOL SUCCINATE ER 25 MG PO TB24
25.0000 mg | ORAL_TABLET | Freq: Every day | ORAL | Status: DC
  Administered 2024-02-11 – 2024-02-12 (×3): 25 mg via ORAL
  Filled 2024-02-10 (×3): qty 1

## 2024-02-10 MED ORDER — NICOTINE 14 MG/24HR TD PT24
14.0000 mg | MEDICATED_PATCH | Freq: Every day | TRANSDERMAL | Status: DC
  Administered 2024-02-10 – 2024-02-11 (×2): 14 mg via TRANSDERMAL
  Filled 2024-02-10 (×2): qty 1

## 2024-02-10 MED ORDER — SODIUM CHLORIDE 0.9% FLUSH
3.0000 mL | Freq: Two times a day (BID) | INTRAVENOUS | Status: DC
  Administered 2024-02-11 – 2024-02-12 (×2): 3 mL via INTRAVENOUS

## 2024-02-10 MED ORDER — ESCITALOPRAM OXALATE 10 MG PO TABS
10.0000 mg | ORAL_TABLET | Freq: Every day | ORAL | Status: DC
  Administered 2024-02-11 – 2024-02-12 (×3): 10 mg via ORAL
  Filled 2024-02-10 (×3): qty 1

## 2024-02-10 MED ORDER — HEPARIN (PORCINE) 25000 UT/250ML-% IV SOLN
1100.0000 [IU]/h | INTRAVENOUS | Status: DC
  Administered 2024-02-10: 1100 [IU]/h via INTRAVENOUS
  Filled 2024-02-10: qty 250

## 2024-02-10 MED ORDER — ALBUTEROL SULFATE (2.5 MG/3ML) 0.083% IN NEBU: 2.5000 mg | INHALATION_SOLUTION | RESPIRATORY_TRACT | Status: DC | PRN

## 2024-02-10 MED ORDER — SIMETHICONE 80 MG PO CHEW
160.0000 mg | CHEWABLE_TABLET | Freq: Once | ORAL | Status: AC
  Administered 2024-02-10: 160 mg via ORAL
  Filled 2024-02-10: qty 2

## 2024-02-10 NOTE — ED Provider Notes (Signed)
 " [place] [facility] AT [facility] Provider Note   CSN: [id number] Arrival date & time: 02/10/24  1611     Patient presents with: Chest Pain   Jasmine Long is a 74 y.o. female.   74 year old female history of lung cancer undergoing immunotherapy with solitary brain met, prior colon cancer, diabetes, hypertension, and thoracic spine fracture who presents to the emergency department with chest discomfort.  Says that she had a greasy meal last night.  Went to bed and then her dog woke her up approximately 12 hours ago and she was feeling substernal chest burning.  Thought it was reflux and tried some Tums without relief.  Currently 5/10 in severity.  Not exertional.  No shortness of breath.  No radiation.  No personal history of MI. Given 324 mg of aspirin  in the ambulance prior to arrival       Prior to Admission medications  Medication Sig Start Date End Date Taking? Authorizing Provider  ALPRAZolam  (XANAX ) 0.5 MG tablet Take 1 tablet by mouth twice daily as needed for anxiety 09/01/23  Yes Sagardia, Emil Schanz, MD  atorvastatin  (LIPITOR) 20 MG tablet Take 1 tablet (20 mg total) by mouth daily. 02/01/24  Yes Sagardia, Emil Schanz, MD  buPROPion  (WELLBUTRIN  XL) 300 MG 24 hr tablet Take 1 tablet (300 mg total) by mouth daily. 02/01/24  Yes Sagardia, Emil Schanz, MD  busPIRone  (BUSPAR ) 7.5 MG tablet Take 1 tablet (7.5 mg total) by mouth 2 (two) times daily. 02/01/24  Yes Sagardia, Emil Schanz, MD  clonazePAM  (KLONOPIN ) 1 MG tablet Take 1 mg by mouth daily as needed for anxiety (Sleep).   Yes [provider]  cyclobenzaprine  (FLEXERIL ) 5 MG tablet Take 5 mg by mouth every 8 (eight) hours as needed. 10/22/23  Yes [provider]  escitalopram  (LEXAPRO ) 10 MG tablet Take 1 tablet (10 mg total) by mouth daily. 02/01/24 01/26/25 Yes SagardiaEmil Schanz, MD  levothyroxine  (SYNTHROID ) 125 MCG tablet Take 1 tablet (125 mcg total) by mouth daily before breakfast. 02/01/24   Yes Sagardia, Emil Schanz, MD  metFORMIN  (GLUCOPHAGE ) 1000 MG tablet Take 1 tablet (1,000 mg total) by mouth 2 (two) times daily with a meal. 02/01/24  Yes Sagardia, Emil Schanz, MD  metoprolol  succinate (TOPROL -XL) 25 MG 24 hr tablet Take 1 tablet (25 mg total) by mouth daily. 02/01/24  Yes Sagardia, Emil Schanz, MD  ondansetron  (ZOFRAN ) 8 MG tablet Take 1 tablet (8 mg total) by mouth every 8 (eight) hours as needed for nausea or vomiting. 01/15/23  Yes Bruning, Ashlyn, PA-C  prochlorperazine  (COMPAZINE ) 10 MG tablet Take 10 mg by mouth every 6 (six) hours as needed for nausea or vomiting. 12/28/23  Yes [provider]  valsartan -hydrochlorothiazide  (DIOVAN -HCT) 160-12.5 MG tablet Take 1 tablet by mouth daily. 02/01/24  Yes Sagardia, Emil Schanz, MD  doxycycline  (VIBRA -TABS) 100 MG tablet Take 1 tablet (100 mg total) by mouth 2 (two) times daily. Patient not taking: Reported on 02/10/2024 01/26/24   Heilingoetter, Cassandra L, PA-C    Allergies: Other    Review of Systems  Updated Vital Signs BP (!) 158/61   Pulse 93   Temp 99.1 F (37.3 C) (Oral)   Resp 19   Ht 5' 9 (1.753 m)   Wt 90 kg   SpO2 98%   BMI 29.30 kg/m   Physical Exam Vitals and nursing note reviewed.  Constitutional:      General: She is not in acute distress.    Appearance:  She is well-developed.  HENT:     Head: Normocephalic and atraumatic.     Right Ear: External ear normal.     Left Ear: External ear normal.     Nose: Nose normal.  Eyes:     Extraocular Movements: Extraocular movements intact.     Conjunctiva/sclera: Conjunctivae normal.     Pupils: Pupils are equal, round, and reactive to light.  Cardiovascular:     Rate and Rhythm: Normal rate and regular rhythm.     Heart sounds: No murmur heard.    Comments: Radial pulses 2+ bilaterally Pulmonary:     Effort: Pulmonary effort is normal. No respiratory distress.     Breath sounds: Normal breath sounds.  Musculoskeletal:     Cervical back:  Normal range of motion and neck supple.     Right lower leg: No edema.     Left lower leg: No edema.  Skin:    General: Skin is warm and dry.  Neurological:     Mental Status: She is alert and oriented to person, place, and time. Mental status is at baseline.  Psychiatric:        Mood and Affect: Mood normal.     (all labs ordered are listed, but only abnormal results are displayed) Labs Reviewed  BASIC METABOLIC PANEL WITH GFR - Abnormal; Notable for the following components:      Result Value   CO2 19 (*)    Glucose, Bld 120 (*)    All other components within normal limits  D-DIMER, QUANTITATIVE (NOT AT [facility]) - Abnormal; Notable for the following components:   D-Dimer, Quant 0.62 (*)    All other components within normal limits  HEMOGLOBIN A1C - Abnormal; Notable for the following components:   Hgb A1c MFr Bld 5.8 (*)    All other components within normal limits  I-STAT CHEM 8, ED - Abnormal; Notable for the following components:   BUN 24 (*)    Glucose, Bld 121 (*)    Calcium , Ion 1.04 (*)    TCO2 21 (*)    All other components within normal limits  TROPONIN T, HIGH SENSITIVITY - Abnormal; Notable for the following components:   Troponin T High Sensitivity 51 (*)    All other components within normal limits  TROPONIN T, HIGH SENSITIVITY - Abnormal; Notable for the following components:   Troponin T High Sensitivity 46 (*)    All other components within normal limits  TROPONIN T, HIGH SENSITIVITY - Abnormal; Notable for the following components:   Troponin T High Sensitivity 57 (*)    All other components within normal limits  CBC  HEPARIN  LEVEL (UNFRACTIONATED)  MAGNESIUM  COMPREHENSIVE METABOLIC PANEL WITH GFR  CBC  CBG MONITORING, ED  TROPONIN T, HIGH SENSITIVITY    EKG: None  Radiology: DG Chest Portable 1 View Result Date: 02/10/2024 CLINICAL DATA:  Chest pain EXAM: PORTABLE CHEST 1 VIEW COMPARISON:  May 13, 2022.  November 22, 2023. FINDINGS: The heart  size and mediastinal contours are within normal limits. Right lung is clear. Nodular opacity is noted in left upper lobe which most likely corresponds to left upper lobe subpleural mass noted on recent CT scan. The visualized skeletal structures are unremarkable. IMPRESSION: Nodular opacity seen in left upper lobe which most likely corresponds to subpleural mass noted on recent CT scan. Electronically Signed   By: Lynwood Landy Raddle M.D.   On: 02/10/2024 17:18     Procedures   Medications Ordered in the  ED  nitroGLYCERIN  (NITROSTAT ) SL tablet 0.4 mg (0.4 mg Sublingual Given 02/10/24 1824)  heparin  ADULT infusion 100 units/mL (25000 units/250mL) (1,100 Units/hr Intravenous Infusion Verify 02/10/24 2157)  tirofiban  (AGGRASTAT ) infusion 50 mcg/mL 100 mL (has no administration in time range)  sodium chloride  flush (NS) 0.9 % injection 3 mL (3 mLs Intravenous Not Given 02/10/24 2311)  acetaminophen  (TYLENOL ) tablet 650 mg (has no administration in time range)    Or  acetaminophen  (TYLENOL ) suppository 650 mg (has no administration in time range)  ondansetron  (ZOFRAN ) tablet 4 mg (has no administration in time range)    Or  ondansetron  (ZOFRAN ) injection 4 mg (has no administration in time range)  bisacodyl  (DULCOLAX) EC tablet 5 mg (has no administration in time range)  albuterol  (PROVENTIL ) (2.5 MG/3ML) 0.083% nebulizer solution 2.5 mg (has no administration in time range)  famotidine  (PEPCID ) tablet 20 mg (20 mg Oral Given 02/10/24 2150)  nicotine  (NICODERM CQ  - dosed in mg/24 hours) patch 14 mg (14 mg Transdermal Patch Applied 02/10/24 2151)  metoprolol  succinate (TOPROL -XL) 24 hr tablet 25 mg (25 mg Oral Given 02/11/24 0037)  ALPRAZolam  (XANAX ) tablet 0.5 mg (has no administration in time range)  levothyroxine  (SYNTHROID ) tablet 125 mcg (has no administration in time range)  escitalopram  (LEXAPRO ) tablet 10 mg (10 mg Oral Given 02/11/24 0037)  atorvastatin  (LIPITOR) tablet 20 mg (20 mg Oral Given  02/10/24 2151)  sodium chloride  flush (NS) 0.9 % injection 10-40 mL (has no administration in time range)  sodium chloride  flush (NS) 0.9 % injection 10-40 mL (has no administration in time range)  cyclobenzaprine  (FLEXERIL ) tablet 5 mg (has no administration in time range)  heparin  bolus via infusion 4,000 Units (4,000 Units Intravenous Bolus from Bag 02/10/24 1945)  simethicone  (MYLICON) chewable tablet 160 mg (160 mg Oral Given 02/10/24 2150)    Clinical Course as of 02/11/24 0111  Thu Feb 10, 2024  1833 Discussed with interventionalist Dr Margaretann. Recommends heparin  gtt and aggrastat  [RP]  1951 Dr Arthea from hospitalist consulted for admission [RP]  2141 Discussed with Dr Gladis from cardiology.  [RP]    Clinical Course User Index [RP] Yolande Lamar BROCKS, MD                                 Medical Decision Making Amount and/or Complexity of Data Reviewed Labs: ordered. Radiology: ordered.  Risk Prescription drug management. Decision regarding hospitalization.   Jasmine Long is a 74 year old female history of lung cancer undergoing immunotherapy with solitary brain met, prior colon cancer, diabetes, hypertension, and thoracic spine fracture who presents to the emergency department with chest discomfort.    Initial Ddx:  MI, PE, pneumonia, dissection, pericarditis, costochondritis, reflux  MDM:  With the patient's chest discomfort will obtain EKG and troponins to evaluate for MI.  Also considering pulmonary embolism but patient is not high risk (Geneva score of 6) so we will obtain a D-dimer at this time.  Considered dissection but with their symmetric pulses, history, and description of the pain feel it is less likely.  If chest x-ray reveals widened mediastinum or any other concerning findings will consider CTA.  Also considered pericarditis but description is unlikely and they do not have risk factors for this diagnosis.  Chest pain not reproducible so feel it costochondritis  less likely.  No infectious symptoms to suggest pneumonia at this time that would be causing pleuritic chest pain.  Plan:  Labs  Troponin D-dimer EKG Chest x-ray  ED Summary/Re-evaluation:  EKG with submillimeter ST elevations in the inferior leads.  Did discuss with the on-call interventional cardiologist Dr. Ladona.  With her description of pain and the EKG changes does not meet STEMI criteria at this point in time but does recommend starting her on heparin  and Aggrastat .  Initial high-sensitivity troponin 51 with repeat at 46.  D-dimer within age-adjusted limits so feel PE is unlikely.  Chest x-ray without acute findings.  She did start complaining of a pleuritic component after her constant chest pain away.  Wondering if she could potentially have pericarditis if her ischemic evaluation is negative  This patient presents to the ED for concern of complaints listed in HPI, this involves an extensive number of treatment options, and is a complaint that carries with it a high risk of complications and morbidity. Disposition including potential need for admission considered.   Dispo: Admit to Floor  Records reviewed Outpatient Clinic Notes The following labs were independently interpreted: Chemistry and show no acute abnormality I independently reviewed the following imaging with scope of interpretation limited to determining acute life threatening conditions related to emergency care: Chest x-ray and agree with the radiologist interpretation with the following exceptions: none I personally reviewed and interpreted cardiac monitoring: normal sinus rhythm  I personally reviewed and interpreted the pt's EKG: see above for interpretation  I have reviewed the patients home medications and made adjustments as needed Consults: Cardiology and Hospitalist Social Determinants of health:  Geriatric  CRITICAL CARE Performed by: Lamar JAYSON Shan   Total critical care time: 30 minutes  Critical  care time was exclusive of separately billable procedures and treating other patients.  Critical care was necessary to treat or prevent imminent or life-threatening deterioration.  Critical care was time spent personally by me on the following activities: development of treatment plan with patient and/or surrogate as well as nursing, discussions with consultants, evaluation of patient's response to treatment, examination of patient, obtaining history from patient or surrogate, ordering and performing treatments and interventions, ordering and review of laboratory studies, ordering and review of radiographic studies, pulse oximetry and re-evaluation of patient's condition.    Final diagnoses:  NSTEMI (non-ST elevated myocardial infarction) [facility])  Chest pain, unspecified type    ED Discharge Orders     None          Shan Lamar JAYSON, MD 02/11/24 0111  "

## 2024-02-10 NOTE — ED Notes (Signed)
 Patients states she is having increased chest pain when she breaths

## 2024-02-10 NOTE — Progress Notes (Signed)
 PHARMACY - ANTICOAGULATION CONSULT NOTE  Pharmacy Consult for IV heparin  + aggrastat  Indication: chest pain/ACS  Allergies[1]  Patient Measurements: Height: 5' 9 (175.3 cm) Weight: 90 kg (198 lb 6.6 oz) IBW/kg (Calculated) : 66.2 HEPARIN  DW (KG): 84.9  Vital Signs: Temp: 98.2 F (36.8 C) (01/15 1623) Temp Source: Oral (01/15 1623) BP: 121/46 (01/15 1845) Pulse Rate: 92 (01/15 1845)  Labs: Recent Labs    02/10/24 1654 02/10/24 1701  HGB 12.5 12.6  HCT 39.2 37.0  PLT 291  --   CREATININE 0.99 1.00    Estimated Creatinine Clearance: 61.7 mL/min (by C-G formula based on SCr of 1 mg/dL).   Medical History: Past Medical History:  Diagnosis Date   Allergy    Anxiety    Cancer (HCC)    small cell lung cancer   Complication of anesthesia    per patient she was hard to wake up   Depression    Diabetes mellitus without complication (HCC)    type 2, does not check blood sugar   Heart murmur    never has caused any problems   Hypertension    Hypothyroidism    Neuromuscular disorder (HCC)    bilateral lower extremity - numbness   Pneumonia    as a child   Thyroid  disease    Ulcer     Medications:  (Not in a hospital admission)   Assessment: 74 yo F presenting with chest pain. Not on AC PTA. Pharmacy consulted to dose heparin  and aggrastat  as a short acting antiplatelet given potential for bypass, stent or medical Rx.   Goal of Therapy:  Heparin  level 0.3-0.7 units/ml Monitor platelets by anticoagulation protocol: Yes   Plan:  Give 4000 units bolus x 1 Start heparin  infusion at 1,100 units/hr Aggrastat  0.15mcg/kg/min infusion Check anti-Xa level in 6 hours and daily while on heparin  Continue to monitor H&H and platelets  Elma Fail, PharmD PGY1 Clinical Pharmacist [facility]  02/10/2024 7:12 PM       [1]  Allergies Allergen Reactions   Other Other (See Comments)    Year long allergies

## 2024-02-10 NOTE — ED Triage Notes (Signed)
 Pt to ed from home with CC Cpx 12 hours pt relays heartburn like feeling with some left arm numbness. Pt denies blood thinners or cardiac hx.

## 2024-02-10 NOTE — Consult Note (Signed)
 "   Cardiology Consultation   Patient ID: Jasmine Long MRN: [id number]; DOB: 1950-05-09  Admit date: 02/10/2024 Date of Consult: 02/10/2024  PCP:  [name], MD   [facility] Providers Cardiologist:  None        Patient Profile: Jasmine Long is a 74 y.o. female with a hx of multiple cancers currently undergoing treatment, HTN, HLD, DM2, borderline to mild AS on a prior echo from 2019 but no h/o CAD who is being seen 02/10/2024 for the evaluation of rest-onset CP at the request of Dr. Arthea.  History of Present Illness: Jasmine Long  is a 74 y.o. 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  She is currently getting immunotherapy once every 4 weeks.  She completed systemic chemotherapy and radiation to the brain met last year. She also has early-stage adenocarcinoma of the colon.  Surgical resection is on hold because of the lung cancer treatment. The patient says she was watching TV last night and fell asleep.  She had just made some sort of fried, greasy potato dish and had eaten that just prior to falling asleep. When she woke up she noticed that she was having pretty severe indigestion feeling chest pain.  It radiated into her back.  The chest discomfort bothered her all night long.  In the morning she asked her son to get her some Tums.  She took 3 Tums and that did not help at all and by that time she was worried so she called 911 and came in for evaluation.  Patient was having some shortness of breath.  No nausea the pain radiated into her back.  She also had some left shoulder discomfort and numbness in her left arm this morning which she really thought was musculoskeletal from sleeping wrong. She has no history of any cardiac problems. In the emergency department the patient was given 1 chewable  aspirin  324 mg she was also given nitroglycerin .  The nitroglycerin  relieved the constant pain.  She continued to have some pain when she would take a deep breath and exhale but the constant pain diffuse chest pain was gone. Pt was seen by cardiology in 2019 after presenting to the ED with CP. TTE done at the time showing borderline to mild AS; CT cors was recommended at the time but apparently never done.   Past Medical History:  Diagnosis Date   Allergy    Anxiety    Cancer (HCC)    small cell lung cancer   Complication of anesthesia    per patient she was hard to wake up   Depression    Diabetes mellitus without complication (HCC)    type 2, does not check blood sugar   Heart murmur    never has caused any problems   Hypertension    Hypothyroidism    Neuromuscular disorder (HCC)    bilateral lower extremity - numbness   Pneumonia    as a child   Thyroid  disease    Ulcer     Past Surgical History:  Procedure Laterality Date   BREAST SURGERY Right    lumpectomy- benign   CESAREAN SECTION  1985   CHOLECYSTECTOMY  1980   open   COLONOSCOPY WITH PROPOFOL  N/A 10/30/2022   Procedure: COLONOSCOPY WITH PROPOFOL ;  Surgeon: Rollin Dover, MD;  Location: [facility];  Service: Gastroenterology;  Laterality: N/A;   ENDOSCOPIC MUCOSAL RESECTION  10/30/2022   Procedure: ENDOSCOPIC MUCOSAL RESECTION;  Surgeon: Rollin Dover, MD;  Location: [facility];  Service: Gastroenterology;;   HEMOSTASIS CLIP PLACEMENT  10/30/2022   Procedure: HEMOSTASIS CLIP PLACEMENT;  Surgeon: Rollin Dover, MD;  Location: [facility];  Service: Gastroenterology;;   KYPHOPLASTY N/A 07/20/2023   Procedure: KYPHOPLASTY THORACIC TWELVE;  Surgeon: Louis Shove, MD;  Location: [facility];  Service: Neurosurgery;  Laterality: N/A;   NASAL SEPTUM SURGERY     POLYPECTOMY  10/30/2022   Procedure: POLYPECTOMY;  Surgeon: Rollin Dover, MD;  Location: [facility];  Service: Gastroenterology;;   ROBLEY LIFTING INJECTION   10/30/2022   Procedure: SUBMUCOSAL LIFTING INJECTION;  Surgeon: Rollin Dover, MD;  Location: [facility];  Service: Gastroenterology;;     Home Medications:  Prior to Admission medications  Medication Sig Start Date End Date Taking? Authorizing Provider  ALPRAZolam  (XANAX ) 0.5 MG tablet Take 1 tablet by mouth twice daily as needed for anxiety 09/01/23  Yes Sagardia, Emil Schanz, MD  atorvastatin  (LIPITOR) 20 MG tablet Take 1 tablet (20 mg total) by mouth daily. 02/01/24  Yes Sagardia, Emil Schanz, MD  buPROPion  (WELLBUTRIN  XL) 300 MG 24 hr tablet Take 1 tablet (300 mg total) by mouth daily. 02/01/24  Yes Sagardia, Emil Schanz, MD  busPIRone  (BUSPAR ) 7.5 MG tablet Take 1 tablet (7.5 mg total) by mouth 2 (two) times daily. 02/01/24  Yes Sagardia, Emil Schanz, MD  clonazePAM  (KLONOPIN ) 1 MG tablet Take 1 mg by mouth daily as needed for anxiety (Sleep).   Yes [provider]  cyclobenzaprine  (FLEXERIL ) 5 MG tablet Take 5 mg by mouth every 8 (eight) hours as needed. 10/22/23  Yes [provider]  escitalopram  (LEXAPRO ) 10 MG tablet Take 1 tablet (10 mg total) by mouth daily. 02/01/24 01/26/25 Yes Sagardia, Emil Schanz, MD  levothyroxine  (SYNTHROID ) 125 MCG tablet Take 1 tablet (125 mcg total) by mouth daily before breakfast. 02/01/24  Yes Sagardia, Emil Schanz, MD  metFORMIN  (GLUCOPHAGE ) 1000 MG tablet Take 1 tablet (1,000 mg total) by mouth 2 (two) times daily with a meal. 02/01/24  Yes Sagardia, Emil Schanz, MD  metoprolol  succinate (TOPROL -XL) 25 MG 24 hr tablet Take 1 tablet (25 mg total) by mouth daily. 02/01/24  Yes Sagardia, Emil Schanz, MD  ondansetron  (ZOFRAN ) 8 MG tablet Take 1 tablet (8 mg total) by mouth every 8 (eight) hours as needed for nausea or vomiting. 01/15/23  Yes Bruning, Ashlyn, PA-C  prochlorperazine  (COMPAZINE ) 10 MG tablet Take 10 mg by mouth every 6 (six) hours as needed for nausea or vomiting. 12/28/23  Yes [provider]  valsartan -hydrochlorothiazide  (DIOVAN -HCT)  160-12.5 MG tablet Take 1 tablet by mouth daily. 02/01/24  Yes Sagardia, Emil Schanz, MD  doxycycline  (VIBRA -TABS) 100 MG tablet Take 1 tablet (100 mg total) by mouth 2 (two) times daily. Patient not taking: Reported on 02/10/2024 01/26/24   Heilingoetter, Cassandra L, PA-C    Scheduled Meds:  atorvastatin   20 mg Oral Daily   escitalopram   10 mg Oral Daily   famotidine   20 mg Oral Daily   [START ON 02/11/2024] levothyroxine   125 mcg Oral QAC breakfast   metoprolol  succinate  25 mg Oral Daily   nicotine   14 mg Transdermal Daily   sodium chloride  flush  3 mL Intravenous Q12H   Continuous Infusions:  heparin  1,100 Units/hr (02/10/24 2157)   tirofiban      PRN Meds: acetaminophen  **OR** acetaminophen , albuterol , ALPRAZolam , bisacodyl , nitroGLYCERIN , ondansetron  **OR**  ondansetron  (ZOFRAN ) IV  Allergies:   Allergies[1]  Social History:   Social History   Socioeconomic History   Marital status: Divorced    Spouse name: Not on file   Number of children: 2   Years of education: Not on file   Highest education level: Bachelor's degree (e.g., BA, AB, BS)  Occupational History   Occupation: Unemployed  Tobacco Use   Smoking status: Former    Current packs/day: 0.00    Average packs/day: 1.5 packs/day for 40.0 years (60.0 ttl pk-yrs)    Types: Cigarettes    Start date: 01/20/1972    Quit date: 01/20/2012    Years since quitting: 12.0   Smokeless tobacco: Never   Tobacco comments:    Hx of E-cigarette use as of 01/20/12.  No longer using E-cig per pt as of 07/15/23.  Vaping Use   Vaping status: Former   Substances: Nicotine , Flavoring  Substance and Sexual Activity   Alcohol use: No   Drug use: No   Sexual activity: Not Currently    Birth control/protection: Post-menopausal  Other Topics Concern   Not on file  Social History Narrative   Patient on worker's compensation for an injury sustained on the [place] 5 years ago.      Right handed and Left Handed       Lives in a two story  home       One son deceased.    Social Drivers of Health   Tobacco Use: Medium Risk (02/01/2024)   Patient History    Smoking Tobacco Use: Former    Smokeless Tobacco Use: Never    Passive Exposure: Not on file  Financial Resource Strain: Low Risk (09/06/2023)   Overall Financial Resource Strain (CARDIA)    Difficulty of Paying Living Expenses: Not hard at all  Food Insecurity: No Food Insecurity (09/06/2023)   Epic    Worried About Programme Researcher, Broadcasting/film/video in the Last Year: Never true    Ran Out of Food in the Last Year: Never true  Transportation Needs: No Transportation Needs (09/06/2023)   Epic    Lack of Transportation (Medical): No    Lack of Transportation (Non-Medical): No  Physical Activity: Inactive (09/06/2023)   Exercise Vital Sign    Days of Exercise per Week: 0 days    Minutes of Exercise per Session: 0 min  Stress: Stress Concern Present (09/06/2023)   [employer] of Occupational Health - Occupational Stress Questionnaire    Feeling of Stress: Very much  Social Connections: Socially Isolated (09/06/2023)   Social Connection and Isolation Panel    Frequency of Communication with Friends and Family: Twice a week    Frequency of Social Gatherings with Friends and Family: Never    Attends Religious Services: Never    Database Administrator or Organizations: No    Attends Banker Meetings: Never    Marital Status: Divorced  Catering Manager Violence: Not At Risk (09/06/2023)   Epic    Fear of Current or Ex-Partner: No    Emotionally Abused: No    Physically Abused: No    Sexually Abused: No  Depression (PHQ2-9): Low Risk (02/01/2024)   Depression (PHQ2-9)    PHQ-2 Score: 0  Alcohol Screen: Low Risk (01/12/2023)   Alcohol Screen    Last Alcohol Screening Score (AUDIT): 0  Housing: Low Risk (10/27/2023)   Epic    Unable to Pay for Housing in the Last Year: No    Number of Times  Moved in the Last Year: 0    Homeless in the Last Year: No  Utilities: Not  At Risk (09/06/2023)   Epic    Threatened with loss of utilities: No  Health Literacy: Adequate Health Literacy (09/06/2023)   B1300 Health Literacy    Frequency of need for help with medical instructions: Never    Family History:    Family History  Problem Relation Age of Onset   Lung cancer Mother 27   Dementia Father    Atrial fibrillation Father    Cancer Paternal Aunt        breast cancer   Colon cancer Paternal Aunt    Colon cancer Cousin      ROS:  Please see the history of present illness.   All other ROS reviewed and negative.     Physical Exam/Data: Vitals:   02/10/24 1745 02/10/24 1845 02/10/24 2016 02/10/24 2017  BP: (!) 118/58 (!) 121/46  (!) 124/44  Pulse: 93 92  94  Resp: 16 (!) 24  16  Temp:   99.1 F (37.3 C)   TempSrc:   Oral   SpO2: 97% 98%  99%  Weight:      Height:        Intake/Output Summary (Last 24 hours) at 02/10/2024 2157 Last data filed at 02/10/2024 2157 Gross per 24 hour  Intake 63.77 ml  Output --  Net 63.77 ml      02/10/2024    4:23 PM 02/01/2024    1:34 PM 01/26/2024    8:44 AM  Last 3 Weights  Weight (lbs) 198 lb 6.6 oz 199 lb 203 lb  Weight (kg) 90 kg 90.266 kg 92.08 kg     Body mass index is 29.3 kg/m.  General:  Well nourished, well developed, in no acute distress HEENT: normal Neck: no JVD Vascular: No carotid bruits; Distal pulses 2+ bilaterally Cardiac:  normal S1, S2; RRR; 2/6 sys murmur at the base  Lungs:  clear to auscultation bilaterally, no wheezing, rhonchi or rales  Abd: soft, nontender, no hepatomegaly  Ext: no edema Musculoskeletal:  No deformities Skin: warm and dry  Neuro:  no focal abnormalities noted Psych:  Normal affect   EKG:  The EKG was personally reviewed and demonstrates:  NSR with nonspecific ST changes Telemetry:  Telemetry was personally reviewed and demonstrates:  NSR  Relevant CV Studies: TTE 05-25-17 Left ventricle: The cavity size was normal. Wall thickness was    increased in a  pattern of moderate LVH. Systolic function was    normal. The estimated ejection fraction was in the range of 55%    to 60%. Wall motion was normal; there were no regional wall    motion abnormalities. Doppler parameters are consistent with    abnormal left ventricular relaxation (grade 1 diastolic    dysfunction). Doppler parameters are consistent with high    ventricular filling pressure.  - Aortic valve: There was very mild stenosis. There was mild    regurgitation.  - Mitral valve: Calcified annulus.  - Left atrium: The atrium was mildly dilated.   Laboratory Data: High Sensitivity Troponin:  No results for input(s): TROPONINIHS in the last 720 hours.  Recent Labs  Lab 02/10/24 1654 02/10/24 1827  TRNPT 51* 46*      Chemistry Recent Labs  Lab 02/10/24 1654 02/10/24 1701  NA 137 137  K 4.8 4.4  CL 104 109  CO2 19*  --   GLUCOSE 120* 121*  BUN 23 24*  CREATININE 0.99 1.00  CALCIUM  9.7  --   GFRNONAA >60  --   ANIONGAP 13  --     No results for input(s): PROT, ALBUMIN, AST, ALT, ALKPHOS, BILITOT in the last 168 hours. Lipids No results for input(s): CHOL, TRIG, HDL, LABVLDL, LDLCALC, CHOLHDL in the last 168 hours.  Hematology Recent Labs  Lab 02/10/24 1654 02/10/24 1701  WBC 9.2  --   RBC 4.15  --   HGB 12.5 12.6  HCT 39.2 37.0  MCV 94.5  --   MCH 30.1  --   MCHC 31.9  --   RDW 14.7  --   PLT 291  --    Thyroid  No results for input(s): TSH, FREET4 in the last 168 hours.  BNPNo results for input(s): BNP, PROBNP in the last 168 hours.  DDimer  Recent Labs  Lab 02/10/24 1654  DDIMER 0.62*    Radiology/Studies:  DG Chest Portable 1 View Result Date: 02/10/2024 CLINICAL DATA:  Chest pain EXAM: PORTABLE CHEST 1 VIEW COMPARISON:  May 13, 2022.  November 22, 2023. FINDINGS: The heart size and mediastinal contours are within normal limits. Right lung is clear. Nodular opacity is noted in left upper lobe which most likely  corresponds to left upper lobe subpleural mass noted on recent CT scan. The visualized skeletal structures are unremarkable. IMPRESSION: Nodular opacity seen in left upper lobe which most likely corresponds to subpleural mass noted on recent CT scan. Electronically Signed   By: Lynwood Landy Raddle M.D.   On: 02/10/2024 17:18     Assessment and Plan: CP: rest-onset, atypical features. No prior h/o obstructive CAD. HST is mildly elevated and essentially flat. No obvious ischemic EKG changes. Further ischemia eval can be done in the AM. Would consider waiting until after echo is done to reassess AS severity before making a decision about type of ischemia eval as more significant AS would likely point things in a different direction. Will defer to incoming team in AM.  2. AS: h/o borderline to mild AS on echo in 2019. Repeat TTE is pending   Risk Assessment/Risk Scores:    TIMI Risk Score for Unstable Angina or Non-ST Elevation MI:   The patient's TIMI risk score is 4, which indicates a 20% risk of all cause mortality, new or recurrent myocardial infarction or need for urgent revascularization in the next 14 days.         For questions or updates, please contact [facility] Please consult [email or web address] for contact info under      Signed, Corean Jarome Lunger, MD, [facility] 02/10/2024 9:57 PM     [1]  Allergies Allergen Reactions   Other Other (See Comments)    Year long allergies   "

## 2024-02-10 NOTE — H&P (Addendum)
 " History and Physical    Patient: Jasmine Long FMW:[id number] DOB: 1950/04/08 DOA: 02/10/2024 DOS: the patient was seen and examined on 02/10/2024 PCP: [name], MD  Patient coming from: Home  Chief Complaint:  Chief Complaint  Patient presents with   Chest Pain   HPI: Jasmine Long is a 74 y.o. 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  She is currently getting immunotherapy once every 4 weeks.  She completed systemic chemotherapy and radiation to the brain met last year. She also has early-stage adenocarcinoma of the colon.  Surgical resection is on hold because of the lung cancer treatment. The patient says she was watching TV last night and fell asleep.  When she woke up she noticed that she was having pretty severe indigestion feeling chest pain.  It radiated into her back.  The patient did have some fried greasy foods earlier but she has never had trouble with indigestion before. The chest discomfort bothered her all night long.  In the morning she asked her son to get her some Tums.  She took 3 Tums and that did not help at all and by that time she was worried so she called 911 and came in for evaluation.  Patient was having some shortness of breath.  No nausea the pain radiated into her back.  She also had some left shoulder discomfort and numbness in her left arm this morning which she really thought was musculoskeletal from sleeping wrong. She has no history of any cardiac problems. In the emergency department the patient was given 1 chewable aspirin  324 mg she was also given nitroglycerin .  The nitroglycerin  relieved the constant pain.  She continued to have some pain when she would take a deep breath and exhale but the constant pain diffuse chest pain was gone. The patient has chronic sinus trouble and  recently took a course of doxycycline  for sinus infection.  She completed the doxycycline  2 days ago.  She also has been noncompliant with her Synthroid  and just recently started taking it again when her doctor discovered that her TSH was very abnormal. Her last bowel movement was yesterday.  She has not had any trouble with diarrhea or constipation.  She is having a lot of trouble with gas pressure today.  Her initial EKG showed a millimeter of ST elevation in 2 and aVF.  Her initial troponin was 51.  Her repeat troponin 2 hours later was 46. Her D-dimer was minimally elevated at 0.62.  When corrected for her age this is within normal limits.  Cardiology was consulted.  they recommended putting patient on IV heparin  and admitting her to the medical service.   Review of Systems: As mentioned in the history of present illness. All other systems reviewed and are negative. Past Medical History:  Diagnosis Date   Allergy    Anxiety    Cancer (HCC)    small cell lung cancer   Complication of anesthesia    per patient she was hard to wake up   Depression    Diabetes mellitus without complication (HCC)    type 2, does not check blood sugar   Heart murmur    never has caused any problems   Hypertension    Hypothyroidism    Neuromuscular disorder (HCC)    bilateral lower extremity - numbness  Pneumonia    as a child   Thyroid  disease    Ulcer    Past Surgical History:  Procedure Laterality Date   BREAST SURGERY Right    lumpectomy- benign   CESAREAN SECTION  1985   CHOLECYSTECTOMY  1980   open   COLONOSCOPY WITH PROPOFOL  N/A 10/30/2022   Procedure: COLONOSCOPY WITH PROPOFOL ;  Surgeon: Rollin Dover, MD;  Location: [facility];  Service: Gastroenterology;  Laterality: N/A;   ENDOSCOPIC MUCOSAL RESECTION  10/30/2022   Procedure: ENDOSCOPIC MUCOSAL RESECTION;  Surgeon: Rollin Dover, MD;  Location: [facility];  Service: Gastroenterology;;   HEMOSTASIS CLIP PLACEMENT  10/30/2022    Procedure: HEMOSTASIS CLIP PLACEMENT;  Surgeon: Rollin Dover, MD;  Location: [facility];  Service: Gastroenterology;;   KYPHOPLASTY N/A 07/20/2023   Procedure: KYPHOPLASTY THORACIC TWELVE;  Surgeon: Louis Shove, MD;  Location[facility];  Service: Neurosurgery;  Laterality: N/A;   NASAL SEPTUM SURGERY     POLYPECTOMY  10/30/2022   Procedure: POLYPECTOMY;  Surgeon: Rollin Dover, MD;  Location: [facility];  Service: Gastroenterology;;   ROBLEY LIFTING INJECTION  10/30/2022   Procedure: SUBMUCOSAL LIFTING INJECTION;  Surgeon: Rollin Dover, MD;  Location: [facility];  Service: Gastroenterology;;   Social History:  reports that she quit smoking about 12 years ago. Her smoking use included cigarettes. She started smoking about 52 years ago. She has a 60 pack-year smoking history. She has never used smokeless tobacco. She reports that she does not drink alcohol and does not use drugs.  Allergies[1]  Family History  Problem Relation Age of Onset   Lung cancer Mother 27   Dementia Father    Atrial fibrillation Father    Cancer Paternal Aunt        breast cancer   Colon cancer Paternal Aunt    Colon cancer Cousin     Prior to Admission medications  Medication Sig Start Date End Date Taking? Authorizing Provider  ALPRAZolam  (XANAX ) 0.5 MG tablet Take 1 tablet by mouth twice daily as needed for anxiety 09/01/23   [name], MD  atorvastatin  (LIPITOR) 20 MG tablet Take 1 tablet (20 mg total) by mouth daily. 02/01/24   Sagardia, Miguel Jose, MD  buPROPion  (WELLBUTRIN  XL) 300 MG 24 hr tablet Take 1 tablet (300 mg total) by mouth daily. 02/01/24   [name], MD  busPIRone  (BUSPAR ) 7.5 MG tablet Take 1 tablet (7.5 mg total) by mouth 2 (two) times daily. 02/01/24   [name], MD  clonazePAM  (KLONOPIN ) 1 MG tablet Take 1 mg by mouth daily as needed for anxiety (Sleep).    [provider]  doxycycline  (VIBRA -TABS) 100 MG tablet Take 1 tablet (100 mg total) by  mouth 2 (two) times daily. 01/26/24   Heilingoetter, Cassandra L, PA-C  escitalopram  (LEXAPRO ) 10 MG tablet Take 1 tablet (10 mg total) by mouth daily. 02/01/24 01/26/25  [name], MD  levothyroxine  (SYNTHROID ) 125 MCG tablet Take 1 tablet (125 mcg total) by mouth daily before breakfast. 02/01/24   [name], MD  metFORMIN  (GLUCOPHAGE ) 1000 MG tablet Take 1 tablet (1,000 mg total) by mouth 2 (two) times daily with a meal. 02/01/24   Sagardia, Emil Schanz, MD  metoprolol  succinate (TOPROL -XL) 25 MG 24 hr tablet Take 1 tablet (25 mg total) by mouth daily. 02/01/24   [name], MD  nicotine  (NICODERM CQ ) 21 mg/24hr patch Place 1 patch (21 mg total) onto the skin daily. Patient not taking: Reported on 11/24/2023 11/26/22  Sagardia, Miguel Jose, MD  ondansetron  (ZOFRAN ) 8 MG tablet Take 1 tablet (8 mg total) by mouth every 8 (eight) hours as needed for nausea or vomiting. 01/15/23   Bruning, Ashlyn, PA-C  traMADol  (ULTRAM ) 50 MG tablet Take 1 tablet (50 mg total) by mouth every 6 (six) hours as needed. 07/20/23 07/19/24  Louis Shove, MD  valsartan -hydrochlorothiazide  (DIOVAN -HCT) 160-12.5 MG tablet Take 1 tablet by mouth daily. 02/01/24   [name], MD    Physical Exam: Vitals:   02/10/24 1745 02/10/24 1845 02/10/24 2016 02/10/24 2017  BP: (!) 118/58 (!) 121/46  (!) 124/44  Pulse: 93 92  94  Resp: 16 (!) 24  16  Temp:   99.1 F (37.3 C)   TempSrc:   Oral   SpO2: 97% 98%  99%  Weight:      Height:       Physical Exam:  General: No acute distress, well developed, well nourished HEENT: Normocephalic, atraumatic, PERRL Cardiovascular: Normal rate and rhythm. Distal pulses intact. Pulmonary: Normal pulmonary effort, mild end exp wheeze in post.left lung base Gastrointestinal: Nondistended abdomen, soft, non-tender, normoactive bowel sounds Musculoskeletal:Normal ROM, no lower ext edema Lymphadenopathy: No cervical LAD. Skin: Skin is warm and dry. Neuro: No  focal deficits noted, AAOx3. PSYCH: Attentive and cooperative  Data Reviewed:  Results for orders placed or performed during the hospital encounter of 02/10/24 (from the past 24 hours)  Basic metabolic panel     Status: Abnormal   Collection Time: 02/10/24  4:54 PM  Result Value Ref Range   Sodium 137 135 - 145 mmol/L   Potassium 4.8 3.5 - 5.1 mmol/L   Chloride 104 98 - 111 mmol/L   CO2 19 (L) 22 - 32 mmol/L   Glucose, Bld 120 (H) 70 - 99 mg/dL   BUN 23 8 - 23 mg/dL   Creatinine, Ser 9.00 0.44 - 1.00 mg/dL   Calcium  9.7 8.9 - 10.3 mg/dL   GFR, Estimated >39 >39 mL/min   Anion gap 13 5 - 15  Troponin T, High Sensitivity     Status: Abnormal   Collection Time: 02/10/24  4:54 PM  Result Value Ref Range   Troponin T High Sensitivity 51 (H) 0 - 19 ng/L  CBC     Status: None   Collection Time: 02/10/24  4:54 PM  Result Value Ref Range   WBC 9.2 4.0 - 10.5 K/uL   RBC 4.15 3.87 - 5.11 MIL/uL   Hemoglobin 12.5 12.0 - 15.0 g/dL   HCT 60.7 63.9 - 53.9 %   MCV 94.5 80.0 - 100.0 fL   MCH 30.1 26.0 - 34.0 pg   MCHC 31.9 30.0 - 36.0 g/dL   RDW 85.2 88.4 - 84.4 %   Platelets 291 150 - 400 K/uL   nRBC 0.0 0.0 - 0.2 %  D-dimer, quantitative     Status: Abnormal   Collection Time: 02/10/24  4:54 PM  Result Value Ref Range   D-Dimer, Quant 0.62 (H) 0.00 - 0.50 ug/mL-FEU  I-stat chem 8, ED     Status: Abnormal   Collection Time: 02/10/24  5:01 PM  Result Value Ref Range   Sodium 137 135 - 145 mmol/L   Potassium 4.4 3.5 - 5.1 mmol/L   Chloride 109 98 - 111 mmol/L   BUN 24 (H) 8 - 23 mg/dL   Creatinine, Ser 8.99 0.44 - 1.00 mg/dL   Glucose, Bld 878 (H) 70 - 99 mg/dL   Calcium , Ion 1.04 (L)  1.15 - 1.40 mmol/L   TCO2 21 (L) 22 - 32 mmol/L   Hemoglobin 12.6 12.0 - 15.0 g/dL   HCT 62.9 63.9 - 53.9 %  Troponin T, High Sensitivity     Status: Abnormal   Collection Time: 02/10/24  6:27 PM  Result Value Ref Range   Troponin T High Sensitivity 46 (H) 0 - 19 ng/L     Assessment and  Plan: Chest pain in patient with hypertension, hyperlipidemia, tobacco abuse and DM.  The pain is concerning because the patient had some EKG changes.  She also has multiple cardiac risk factors and the pain was like indigestion when it started last night radiated into her back and into her shoulder.  The atypical features are that the patient has had pain was constant for greater then 12 hours and her troponins are decreasing.  Dr. Ladona of cardiology has been consulted.  He has recommended IV heparin  and Aggrastat  for tonight and they will consult in the a.m. -Will go ahead and treat her indigestion symptoms aggressively.  She did take Tums at home with no improvement.  Will start Pepcid  and try simethicone . - Echo ordered.  - Continue Aspirin  and beta blocker - Continue to cycle cardiac enzymes - The patient is scheduled for her routine every 15-month CT of her chest abdomen and pelvis in 4 days.  If her pain does not resolve consider getting that CT while hospitalized. Hypothyroidism -the patient was noncompliant for some time and her TSH was very high.  She is back on her Synthroid  daily now. Lung cancer - She reports that the immunotherapy that she receives monthly is shrinking her tumor. 4. Colon cancer -surgery on hold due to lung cancer treatment 5.  Hypertension - Continue Metoprolol  6.  Recurrent sinus infections and bronchitis -the patient just completed course of doxycycline  2 days ago.  Will add as needed nebs because of the mild wheeze.  7.  Continue nicotine  abuse -nicotine  patch Smoking cessation counseling needed   Advance Care Planning:   Code Status: Full Code the patient names her son Lamar as her runner, broadcasting/film/video and she wants to be full code.  Consults: Cardiology, Dr. Ladona  Family Communication: none  Severity of Illness: The appropriate patient status for this patient is INPATIENT. Inpatient status is judged to be reasonable and necessary in order to provide  the required intensity of service to ensure the patient's safety. The patient's presenting symptoms, physical exam findings, and initial radiographic and laboratory data in the context of their chronic comorbidities is felt to place them at high risk for further clinical deterioration. Furthermore, it is not anticipated that the patient will be medically stable for discharge from the hospital within 2 midnights of admission.   * I certify that at the point of admission it is my clinical judgment that the patient will require inpatient hospital care spanning beyond 2 midnights from the point of admission due to high intensity of service, high risk for further deterioration and high frequency of surveillance required.*  Author: ARTHEA CHILD, MD 02/10/2024 8:51 PM  For on call review [email or web address].      [1]  Allergies Allergen Reactions   Other Other (See Comments)    Year long allergies   "

## 2024-02-11 ENCOUNTER — Other Ambulatory Visit: Payer: Self-pay

## 2024-02-11 ENCOUNTER — Inpatient Hospital Stay (HOSPITAL_COMMUNITY)

## 2024-02-11 DIAGNOSIS — R079 Chest pain, unspecified: Secondary | ICD-10-CM

## 2024-02-11 DIAGNOSIS — C3412 Malignant neoplasm of upper lobe, left bronchus or lung: Secondary | ICD-10-CM | POA: Diagnosis not present

## 2024-02-11 DIAGNOSIS — I4891 Unspecified atrial fibrillation: Secondary | ICD-10-CM

## 2024-02-11 DIAGNOSIS — I700 Atherosclerosis of aorta: Secondary | ICD-10-CM

## 2024-02-11 DIAGNOSIS — R0789 Other chest pain: Secondary | ICD-10-CM

## 2024-02-11 DIAGNOSIS — I409 Acute myocarditis, unspecified: Secondary | ICD-10-CM

## 2024-02-11 LAB — CBC
HCT: 34.7 % — ABNORMAL LOW (ref 36.0–46.0)
Hemoglobin: 11.2 g/dL — ABNORMAL LOW (ref 12.0–15.0)
MCH: 29.9 pg (ref 26.0–34.0)
MCHC: 32.3 g/dL (ref 30.0–36.0)
MCV: 92.5 fL (ref 80.0–100.0)
Platelets: 266 K/uL (ref 150–400)
RBC: 3.75 MIL/uL — ABNORMAL LOW (ref 3.87–5.11)
RDW: 14.7 % (ref 11.5–15.5)
WBC: 7.1 K/uL (ref 4.0–10.5)
nRBC: 0 % (ref 0.0–0.2)

## 2024-02-11 LAB — COMPREHENSIVE METABOLIC PANEL WITH GFR
ALT: 9 U/L (ref 0–44)
AST: 14 U/L — ABNORMAL LOW (ref 15–41)
Albumin: 4.1 g/dL (ref 3.5–5.0)
Alkaline Phosphatase: 110 U/L (ref 38–126)
Anion gap: 10 (ref 5–15)
BUN: 18 mg/dL (ref 8–23)
CO2: 24 mmol/L (ref 22–32)
Calcium: 9.4 mg/dL (ref 8.9–10.3)
Chloride: 102 mmol/L (ref 98–111)
Creatinine, Ser: 1.06 mg/dL — ABNORMAL HIGH (ref 0.44–1.00)
GFR, Estimated: 56 mL/min — ABNORMAL LOW
Glucose, Bld: 122 mg/dL — ABNORMAL HIGH (ref 70–99)
Potassium: 4.1 mmol/L (ref 3.5–5.1)
Sodium: 136 mmol/L (ref 135–145)
Total Bilirubin: 1 mg/dL (ref 0.0–1.2)
Total Protein: 6.9 g/dL (ref 6.5–8.1)

## 2024-02-11 LAB — LIPID PANEL
Cholesterol: 184 mg/dL (ref 0–200)
HDL: 68 mg/dL
LDL Cholesterol: 100 mg/dL — ABNORMAL HIGH (ref 0–99)
Total CHOL/HDL Ratio: 2.7 ratio
Triglycerides: 82 mg/dL
VLDL: 16 mg/dL (ref 0–40)

## 2024-02-11 LAB — TROPONIN T, HIGH SENSITIVITY: Troponin T High Sensitivity: 57 ng/L — ABNORMAL HIGH (ref 0–19)

## 2024-02-11 LAB — ECHOCARDIOGRAM COMPLETE
AR max vel: 2.65 cm2
AV Area VTI: 2.64 cm2
AV Area mean vel: 2.48 cm2
AV Mean grad: 11.5 mmHg
AV Peak grad: 20.6 mmHg
Ao pk vel: 2.27 m/s
Area-P 1/2: 2.78 cm2
Calc EF: 65.3 %
Height: 69 in
P 1/2 time: 116 ms
S' Lateral: 2.9 cm
Single Plane A2C EF: 67.8 %
Single Plane A4C EF: 61.6 %
Weight: 3174.62 [oz_av]

## 2024-02-11 LAB — GLUCOSE, CAPILLARY
Glucose-Capillary: 91 mg/dL (ref 70–99)
Glucose-Capillary: 122 mg/dL — ABNORMAL HIGH (ref 70–99)

## 2024-02-11 LAB — MAGNESIUM: Magnesium: 1.9 mg/dL (ref 1.7–2.4)

## 2024-02-11 LAB — C-REACTIVE PROTEIN: CRP: 7.2 mg/dL — ABNORMAL HIGH

## 2024-02-11 LAB — HEPARIN LEVEL (UNFRACTIONATED): Heparin Unfractionated: 0.32 [IU]/mL (ref 0.30–0.70)

## 2024-02-11 LAB — HEMOGLOBIN A1C
Hgb A1c MFr Bld: 5.8 % — ABNORMAL HIGH (ref 4.8–5.6)
Mean Plasma Glucose: 119.76 mg/dL

## 2024-02-11 LAB — SEDIMENTATION RATE: Sed Rate: 46 mm/h — ABNORMAL HIGH (ref 0–22)

## 2024-02-11 MED ORDER — METOPROLOL TARTRATE 5 MG/5ML IV SOLN
INTRAVENOUS | Status: AC
  Filled 2024-02-11: qty 5

## 2024-02-11 MED ORDER — HEPARIN (PORCINE) 25000 UT/250ML-% IV SOLN
1100.0000 [IU]/h | INTRAVENOUS | Status: DC
  Administered 2024-02-11 (×2): 1100 [IU]/h via INTRAVENOUS
  Filled 2024-02-11: qty 250

## 2024-02-11 MED ORDER — SODIUM CHLORIDE 0.9% FLUSH: 10.0000 mL | INTRAVENOUS | Status: DC | PRN

## 2024-02-11 MED ORDER — METOPROLOL TARTRATE 5 MG/5ML IV SOLN: 5.0000 mg | Freq: Once | INTRAVENOUS | Status: DC

## 2024-02-11 MED ORDER — NITROGLYCERIN 0.4 MG SL SUBL
SUBLINGUAL_TABLET | SUBLINGUAL | Status: AC
  Filled 2024-02-11: qty 2

## 2024-02-11 MED ORDER — COLCHICINE 0.6 MG PO TABS
0.6000 mg | ORAL_TABLET | Freq: Two times a day (BID) | ORAL | Status: DC
  Administered 2024-02-11 – 2024-02-12 (×3): 0.6 mg via ORAL
  Filled 2024-02-11 (×3): qty 1

## 2024-02-11 MED ORDER — APIXABAN 5 MG PO TABS
5.0000 mg | ORAL_TABLET | Freq: Two times a day (BID) | ORAL | Status: DC
  Administered 2024-02-11 – 2024-02-12 (×2): 5 mg via ORAL
  Filled 2024-02-11: qty 1
  Filled 2024-02-11: qty 2

## 2024-02-11 MED ORDER — IOHEXOL 350 MG/ML SOLN
95.0000 mL | Freq: Once | INTRAVENOUS | Status: AC | PRN
  Administered 2024-02-11: 95 mL via INTRAVENOUS

## 2024-02-11 MED ORDER — SODIUM CHLORIDE 0.9% FLUSH
10.0000 mL | Freq: Two times a day (BID) | INTRAVENOUS | Status: DC
  Administered 2024-02-11 – 2024-02-12 (×4): 10 mL

## 2024-02-11 MED ORDER — ASPIRIN 81 MG PO TBEC
81.0000 mg | DELAYED_RELEASE_TABLET | Freq: Every day | ORAL | Status: DC
  Administered 2024-02-11 – 2024-02-12 (×2): 81 mg via ORAL
  Filled 2024-02-11 (×2): qty 1

## 2024-02-11 MED ORDER — NITROGLYCERIN 0.4 MG SL SUBL
0.8000 mg | SUBLINGUAL_TABLET | Freq: Once | SUBLINGUAL | Status: AC
  Administered 2024-02-11: 0.8 mg via SUBLINGUAL

## 2024-02-11 MED ORDER — METOPROLOL TARTRATE 5 MG/5ML IV SOLN
2.5000 mg | Freq: Once | INTRAVENOUS | Status: AC
  Administered 2024-02-11: 2.5 mg via INTRAVENOUS

## 2024-02-11 MED ORDER — CYCLOBENZAPRINE HCL 10 MG PO TABS
5.0000 mg | ORAL_TABLET | Freq: Three times a day (TID) | ORAL | Status: DC | PRN
  Administered 2024-02-11 (×2): 5 mg via ORAL
  Filled 2024-02-11 (×2): qty 1

## 2024-02-11 NOTE — Progress Notes (Signed)
 Transition of Care [facility]) - Inpatient Brief Assessment  Patient Details  Name: Jasmine Long MRN: [id number] Date of Birth: [birth year]  Transition of [facility] CM/SW Contact:    Nena LITTIE Coffee, RN Phone Number: 02/11/2024, 5:37 PM  Clinical Narrative: Inpt to Obs. Observation status explained to pt. Pt refused to sign observation notice, stating I don't believe it needs a signature right now. Observation notice printed and left at bedside.   Pt states her son moved in with her when she got her cancer diagnosis and provides transportation and assistance when needed. Pt also states that she has all necessary equipment. No ICM/TOC needs at this time.   Transition of Care Asessment: Insurance and Status: Insurance coverage has been reviewed Patient has primary care physician: Yes Home environment has been reviewed: From home c/son Prior level of function:: Independent   Social Drivers of Health Review: (P) SDOH reviewed no interventions necessary Readmission risk has been reviewed: (P) Yes Transition of care needs: (P) no transition of care needs at this time

## 2024-02-11 NOTE — Plan of Care (Signed)
 Fortunately the coronary CTA was performed today and shows a calcium  score of 30 with nonobstructive coronary artery disease.  No high risk findings.  No need for cath at this time.  Stop heparin  and changed to Eliquis  5 mg twice daily for atrial fibrillation stroke prophylaxis.  Continue management for myopericarditis as noted previously.  Thank you, Emeline Calender, DO

## 2024-02-11 NOTE — Progress Notes (Signed)
 BRIEF ONCOLOGY NOTE: 02/11/2024 N/C:  Patient with oncologic history significant for extensive small cell lung cancer with brain mets.   Message received from Hospitalist/Dr. 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  Cardiology recommending Oncology revisit continuing immunotherapy. Last immunotherapy received 01/26/2024 with next cycle due on 02/23/24.  We will HOLD current oncologic therapy at this time.  Primary Oncologist is Dr. Sherrod.  Dr. Sherrod will be made aware and will make further oncologic  therapy recommendations.  Recommend stabilize patient from cardiac standpoint; appreciate Cardiology and Medicine teams' assistance and taking good care of this patient.    Patient has been added to [facility] list for continued follow-up and management as needed.  Olam JINNY Brunner, NP

## 2024-02-11 NOTE — Discharge Instructions (Signed)
Psychiatry and Counseling  [facility]  [address]  [place], [place]  [phone]  Psychiatrists  [facility]  [address]  [place], [place] [address] [place], [place] [address]  [phone]  Dr. Gerald Plovsky [facility]  [address]  [place] [address] [place] [address]  [phone]  Parish McKinney, MD [facility]  [address]  [place] [address] [place] [address]  [phone]  Therapists  [facility]  [address]  [place], [place]  [phone]   [facility]  [address]  [place] [address] [place], [place]  [phone]  [facility]  [address]  [place], [place] [address] [place], [place] [address]  [phone]  [facility]  [address]  [place], [place] [address] , [place] [address]  [phone]    

## 2024-02-11 NOTE — Consult Note (Signed)
 Jasmine Long is a very nice 74 year old white female.  She is followed by Dr. Gatha.  She has a history of extensive stage small cell lung cancer.  She presented in December 2024 with a large left upper lobe mass with mediastinal and left supraclavicular adenopathy.  She had a solitary brain met.  She underwent stereotactic radiosurgery for the brain met.  She then had systemic chemotherapy with concurrent radiotherapy.  This was not completed in March 2025.  She then had Imfinzi  added.  She started and thins the on 08/2023.SABRA  She has had no problems with the Imfinzi .  I think she has had a response.  She actually is due for another CT scan on 02/14/2024.    Couple days ago, she began to have some chest discomfort.  She thought that it was probably heartburn.  However, it seemed to worsen.  She called EMS and was brought to [facility].  She has some shortness of breath.  She had a little bit of left shoulder discomfort and numbness in the left arm.  In the ER, she was given some aspirin  and nitroglycerin .  She had relief of the pain.  She does not have a history of cardiac issues.  She does have a history of tobacco use.  She had lab work done when she came which showed a white count of 9.2.  Hemoglobin 12.5.  Platelet count 291,000.  Her sodium 137.  Potassium 4.8.  BUN 23 creatinine 0.99.  Calcium  9.7.  Current LFTs are normal.  She had elevated Troponin T.  She had echocardiogram which looked okay from what I can tell.  She had a cardiac CT scan which showed a coronary calcium  score 30.2.  It was not felt that there is any need for a cardiac cath.  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  She has been started on colchicine .  She does not have any chest pain.  She was found to have atrial fibrillation.  She currently is on a heparin  infusion.  She has had no fever.  She has had no change in bowel or bladder habits.  She has had no nausea or vomiting.  There  has been no leg pain or leg swelling.  She has had no bleeding.   Her vital signs showed temperature 98.7.  Pulse 79.  Blood pressure 132/85.  Her head neck exam shows no ocular or oral lesions.  There are no palpable cervical or supraclavicular lymph nodes.  Lungs are clear bilaterally.  She has good air movement bilaterally.  No wheezes noted.  Cardiac exam is regular rate and rhythm.  I really cannot detect any arrhythmia.  She has no murmurs.  Abdomen is soft.  She might be a little bit obese.  She has no fluid wave.  There is no palpable liver or spleen tip.  Extremity shows no clubbing, cyanosis or edema.  Neurological exam shows no focal neurological deficits.    I think that the issue is whether or not she has immune mediated toxicity from the Imfinzi .  The risk of cardiotoxicity from Imfinzi  is incredibly low.  However, it certainly has been reported.  I think it may be in the [employer] Drug Warning that there might be the risk of myocarditis with Imfinzi .  He is totally asymptomatic right now.  She was put on colchicine .  I do not think that she needs immunosuppressive therapy (i.e. steroids) since she is asymptomatic.  The atrial fibrillation  could certainly be another toxicity from the immunotherapy.  As such, cardiology will anticoagulate her.  I wonder if the fact that she had radiotherapy for her malignancy back in the Spring 2025 may be a risk factor for cardiotoxicity secondary to immunotherapy.  Clearly, immunotherapy has helped her.  As such, it certainly would be nice to try to rechallenge her.  I think this could be accomplished.  If not, then another option might be lurbinectedin which is being used in the maintenance setting.  She is determined to go home tomorrow.  From my point of view, she concern to go home.  I think this is going to be up to Cardiology and Hospital Medicine.  I know she has to get up onto oral anticoagulation.  She has her CT scan scheduled as an  outpatient for Monday to see if she is responding.  She is very nice.  I thoroughly enjoyed talking with her.  We will follow along if she is in the hospital over the weekend.   Jasmine Crease, MD  Psalm 30:2

## 2024-02-11 NOTE — Progress Notes (Signed)

## 2024-02-11 NOTE — ED Notes (Signed)
 Informed by pharmacy that the previous order to discontinue heparin  infusion was incorrect. New orders for heparin  infusion received and started according to order.

## 2024-02-11 NOTE — Progress Notes (Addendum)
 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  Initial troponin was 51.  D-dimer mildly elevated.  Cardiology consulted.  Echo pending.  Also found to have new onset A-fib.  Currently on heparin  drip.  Workup pending  Assessment & Plan:  Principal Problem:   Chest pain Active Problems:   Hypothyroid   Nicotine  addiction   HTN (hypertension), benign   Heart murmur   Dyslipidemia associated with type 2 diabetes mellitus (HCC)   Dyslipidemia   Small cell lung cancer (HCC)   Aortic valve stenosis   Elevated troponin   Type 2 diabetes mellitus without complications (HCC)   Malignant neoplasm of lung (HCC)   Thyroid  disease   Hyperlipidemia  Chest pain: Presented with chest pain, mild elevated troponin on presentation.  EKG changes.  Has atypical features but cannot rule out coronary artery disease.  Has significant risk factors like hypertension, hyperlipidemia, tobacco use .  Currently on heparin  drip.  Echo pending.  Cardiology following.  Currently chest pain-free.  Continue aspirin , beta-blocker  New onset A-fib:CHA2DS2-VASc: 4 .  Monitor on telemetry.  Rate controlled.  Currently on heparin  drip. She may need  anticoagulation on discharge.  On metoprolol   Metastatic lung cancer/colon cancer: Diagnosed in 2024.  Currently on immunotherapy, follows with oncology.  She completed systemic chemotherapy and radiation to the brain met last year.  Also has early stage adenocarcinoma of the lung.  Surgical resection plan is on hold due to concurrent lung cancer treatment.  We recommend to follow-up with  oncology as an outpatient.  Hypertension: Currently blood pressure stable.  Continue monitoring.  Continue current medications  Prediabetes: A1c of 5.8.  Hyperlipidemia: Continue statin  Tobacco use: Counseled cessation.  Continue nicotine  patch.  Smokes 1 pack a day.  Hypothyroidism: Continue Synthyroid  Patient lives with her son and ambulates with a help of cane.  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 Concern for  immunotherapy mediated process. I communicated with oncology ETTER Olam Atanacio CARROLYN), who will make  Dr Sherrod aware about this.       DVT prophylaxis:IV heparin      Code Status: Full Code  Family Communication: None at bedside  Patient status:Inpatient  Patient is from :home  Anticipated discharge un:ynfz  Estimated DC date:after cardiac workup, cardiology clearance   Consultants: Cardiology  Procedures: None  Antimicrobials:  Anti-infectives (From admission, onward)    None       Subjective: Patient seen and examined at bedside today.  Hemodynamically stable.  Remains in normal sinus rhythm with controlled heart rate.  Denies chest pain during my evaluation.  Overall comfortable.  Lying in bed.  No new complaints  Objective: Vitals:   02/11/24 0153 02/11/24 0405 02/11/24 0630 02/11/24 1037  BP: 130/62  (!) 101/47 (!) 112/37  Pulse: 84 85 66 80  Resp: 20 18 16 19   Temp: 98.9 F (37.2 C)  98.5 F (36.9 C) 98.1 F (36.7 C)  TempSrc: Oral   Oral  SpO2: 97% 95% 100% 98%  Weight:      Height:  Intake/Output Summary (Last 24 hours) at 02/11/2024 1140 Last data filed at 02/11/2024 0916 Gross per 24 hour  Intake 312.31 ml  Output --  Net 312.31 ml   Filed Weights   02/10/24 1623  Weight: 90 kg    Examination:  General exam: Overall comfortable, not in distress HEENT: PERRL Respiratory system:  no wheezes or crackles  Cardiovascular system: Irregularly irregular  rhythm  gastrointestinal system: Abdomen is nondistended, soft and nontender. Central nervous system: Alert and oriented Extremities: No edema, no clubbing ,no cyanosis Skin: No rashes, no ulcers,no icterus     Data Reviewed: I have personally reviewed following labs and imaging studies  CBC: Recent Labs  Lab 02/10/24 1654 02/10/24 1701 02/11/24 0355  WBC 9.2  --  7.1  HGB 12.5 12.6 11.2*  HCT 39.2 37.0 34.7*  MCV 94.5  --  92.5  PLT 291  --  266   Basic Metabolic Panel: Recent Labs  Lab 02/10/24 1654 02/10/24 1701 02/11/24 0355  NA 137 137 136  K 4.8 4.4 4.1  CL 104 109 102  CO2 19*  --  24  GLUCOSE 120* 121* 122*  BUN 23 24* 18  CREATININE 0.99 1.00 1.06*  CALCIUM  9.7  --  9.4  MG  --   --  1.9     No results found for this or any previous visit (from the past 240 hours).   Radiology Studies: DG Chest Portable 1 View Result Date: 02/10/2024 CLINICAL DATA:  Chest pain EXAM: PORTABLE CHEST 1 VIEW COMPARISON:  May 13, 2022.  November 22, 2023. FINDINGS: The heart size and mediastinal contours are within normal limits. Right lung is clear. Nodular opacity is noted in left upper lobe which most likely corresponds to left upper lobe subpleural mass noted on recent CT scan. The visualized skeletal structures are unremarkable. IMPRESSION: Nodular opacity seen in left upper lobe which most likely corresponds to subpleural mass noted on recent CT scan. Electronically Signed   By: Lynwood Landy Raddle M.D.   On: 02/10/2024 17:18    Scheduled Meds:  aspirin  EC  81 mg Oral Daily   atorvastatin   20 mg Oral Daily   escitalopram   10 mg Oral Daily   famotidine   20 mg Oral Daily   levothyroxine   125 mcg Oral QAC breakfast   metoprolol  succinate  25 mg Oral Daily   nicotine   14 mg Transdermal Daily   sodium chloride  flush  10-40 mL Intracatheter Q12H   sodium chloride  flush  3 mL Intravenous Q12H   Continuous Infusions:  heparin  1,100 Units/hr (02/11/24 1054)     LOS: 1 day    Ivonne Mustache, MD [facility] P1/16/2026, 11:40 AM  "

## 2024-02-11 NOTE — TOC CM/SW Note (Signed)
 Transition of Care (TOC) CM/SW Note    SDOH completed. CSW attached psychiatry and counseling resources to patients AVS.

## 2024-02-11 NOTE — Care Management CC44 (Signed)
"         Condition Code 44 Documentation Completed  Patient Details  Name: Jasmine Long MRN: [id number] Date of Birth: [birth year]   Condition Code 44 given:  Yes Patient signature on Condition Code 44 notice:  Yes Documentation of 2 MD's agreement:  Yes Code 44 added to claim:  Yes    Nena LITTIE Coffee, RN 02/11/2024, 5:25 PM  "

## 2024-02-11 NOTE — Progress Notes (Signed)
 PHARMACY - ANTICOAGULATION CONSULT NOTE  Pharmacy Consult for IV heparin  + aggrastat  Indication: chest pain/ACS  Allergies[1]  Patient Measurements: Height: 5' 9 (175.3 cm) Weight: 90 kg (198 lb 6.6 oz) IBW/kg (Calculated) : 66.2 HEPARIN  DW (KG): 84.9  Vital Signs: Temp: 98.9 F (37.2 C) (01/16 0153) Temp Source: Oral (01/16 0153) BP: 130/62 (01/16 0153) Pulse Rate: 85 (01/16 0405)  Labs: Recent Labs    02/10/24 1654 02/10/24 1701 02/11/24 0355  HGB 12.5 12.6 11.2*  HCT 39.2 37.0 34.7*  PLT 291  --  266  HEPARINUNFRC  --   --  0.32  CREATININE 0.99 1.00  --     Estimated Creatinine Clearance: 61.7 mL/min (by C-G formula based on SCr of 1 mg/dL).   Medical History: Past Medical History:  Diagnosis Date   Allergy    Anxiety    Cancer (HCC)    small cell lung cancer   Complication of anesthesia    per patient she was hard to wake up   Depression    Diabetes mellitus without complication (HCC)    type 2, does not check blood sugar   Heart murmur    never has caused any problems   Hypertension    Hypothyroidism    Neuromuscular disorder (HCC)    bilateral lower extremity - numbness   Pneumonia    as a child   Thyroid  disease    Ulcer     Medications:  (Not in a hospital admission)   Assessment: 74 yo F presenting with chest pain. Not on AC PTA. Pharmacy consulted to dose heparin  and aggrastat  as a short acting antiplatelet given potential for bypass, stent or medical Rx  AM: heparin  level within goal on 1100 units/hr. Per RN, no signs/symptoms of bleeding or issues with the heparin  gtt running continuously. CBC shows Hgb 11s, plts WNL  Goal of Therapy:  Heparin  level 0.3-0.7 units/ml Monitor platelets by anticoagulation protocol: Yes   Plan:  Continue heparin  infusion at 1100 units/hr Aggrastat  0.15mcg/kg/min infusion Check confirmatory anti-Xa level in 8 hours and daily while on heparin  Continue to monitor H&H and platelets  Lynwood Poplar,  PharmD, BCPS Clinical Pharmacist 02/11/2024 5:03 AM        [1]  Allergies Allergen Reactions   Other Other (See Comments)    Year long allergies

## 2024-02-11 NOTE — ED Notes (Signed)
 Patient is refusing blood glucose monitoring, MD aware.

## 2024-02-11 NOTE — Care Management Obs Status (Signed)
 MEDICARE OBSERVATION STATUS NOTIFICATION   Patient Details  Name: Jasmine Long MRN: [id number] Date of Birth: January 02, 1951   Medicare Observation Status Notification Given:  Yes    Nena LITTIE Coffee, RN 02/11/2024, 5:25 PM

## 2024-02-11 NOTE — Progress Notes (Addendum)
 "  Progress Note  Patient Name: Jasmine Long Date of Encounter: 02/11/2024  Primary Cardiologist: None   Subjective   Chest pain has essentially resolved but still having some discomfort when she takes a deep breath in. Otherwise no palpitations or other complaints. No recent sick contacts or viral syndromes.   Inpatient Medications    Scheduled Meds:  atorvastatin   20 mg Oral Daily   escitalopram   10 mg Oral Daily   famotidine   20 mg Oral Daily   levothyroxine   125 mcg Oral QAC breakfast   metoprolol  succinate  25 mg Oral Daily   nicotine   14 mg Transdermal Daily   sodium chloride  flush  10-40 mL Intracatheter Q12H   sodium chloride  flush  3 mL Intravenous Q12H   Continuous Infusions:  heparin  1,100 Units/hr (02/11/24 0612)   tirofiban  0.15 mcg/kg/min (02/11/24 9366)   PRN Meds: acetaminophen  **OR** acetaminophen , albuterol , ALPRAZolam , bisacodyl , cyclobenzaprine , nitroGLYCERIN , ondansetron  **OR** ondansetron  (ZOFRAN ) IV, sodium chloride  flush   Vital Signs    Vitals:   02/11/24 0125 02/11/24 0153 02/11/24 0405 02/11/24 0630  BP:  130/62  (!) 101/47  Pulse: 82 84 85 66  Resp: 20 20 18 16   Temp: 98.9 F (37.2 C) 98.9 F (37.2 C)  98.5 F (36.9 C)  TempSrc:  Oral    SpO2: 94% 97% 95% 100%  Weight:      Height:        Intake/Output Summary (Last 24 hours) at 02/11/2024 0707 Last data filed at 02/10/2024 2157 Gross per 24 hour  Intake 63.77 ml  Output --  Net 63.77 ml   Filed Weights   02/10/24 1623  Weight: 90 kg    Telemetry    Atrial fibrillation- Personally Reviewed  ECG    Normal sinus rhythm with diffuse ST elevation- Personally Reviewed  Physical Exam   Physical Exam Vitals and nursing note reviewed.  Constitutional:      Appearance: Normal appearance.  HENT:     Head: Normocephalic and atraumatic.  Eyes:     Conjunctiva/sclera: Conjunctivae normal.  Cardiovascular:     Rate and Rhythm: Normal rate. Rhythm irregular.     Heart sounds:  Murmur heard.  Pulmonary:     Effort: Pulmonary effort is normal.     Breath sounds: Normal breath sounds.  Musculoskeletal:        General: No swelling or tenderness.     Comments: Mild chest discomfort with deep inspiration  Skin:    Coloration: Skin is not jaundiced or pale.  Neurological:     Mental Status: She is alert.      Labs    Chemistry Recent Labs  Lab 02/10/24 1654 02/10/24 1701 02/11/24 0355  NA 137 137 136  K 4.8 4.4 4.1  CL 104 109 102  CO2 19*  --  24  GLUCOSE 120* 121* 122*  BUN 23 24* 18  CREATININE 0.99 1.00 1.06*  CALCIUM  9.7  --  9.4  PROT  --   --  6.9  ALBUMIN  --   --  4.1  AST  --   --  14*  ALT  --   --  9  ALKPHOS  --   --  110  BILITOT  --   --  1.0  GFRNONAA >60  --  56*  ANIONGAP 13  --  10     Hematology Recent Labs  Lab 02/10/24 1654 02/10/24 1701 02/11/24 0355  WBC 9.2  --  7.1  RBC  4.15  --  3.75*  HGB 12.5 12.6 11.2*  HCT 39.2 37.0 34.7*  MCV 94.5  --  92.5  MCH 30.1  --  29.9  MCHC 31.9  --  32.3  RDW 14.7  --  14.7  PLT 291  --  266    Cardiac EnzymesNo results for input(s): TROPONINI in the last 168 hours. No results for input(s): TROPIPOC in the last 168 hours.   BNPNo results for input(s): BNP, PROBNP in the last 168 hours.   DDimer  Recent Labs  Lab 02/10/24 1654  DDIMER 0.62*     Radiology    DG Chest Portable 1 View Result Date: 02/10/2024 CLINICAL DATA:  Chest pain EXAM: PORTABLE CHEST 1 VIEW COMPARISON:  May 13, 2022.  November 22, 2023. FINDINGS: The heart size and mediastinal contours are within normal limits. Right lung is clear. Nodular opacity is noted in left upper lobe which most likely corresponds to left upper lobe subpleural mass noted on recent CT scan. The visualized skeletal structures are unremarkable. IMPRESSION: Nodular opacity seen in left upper lobe which most likely corresponds to subpleural mass noted on recent CT scan. Electronically Signed   By: Lynwood Landy Raddle M.D.    On: 02/10/2024 17:18    Cardiac Studies   TTE 05-25-17 Left ventricle: The cavity size was normal. Wall thickness was    increased in a pattern of moderate LVH. Systolic function was    normal. The estimated ejection fraction was in the range of 55%    to 60%. Wall motion was normal; there were no regional wall    motion abnormalities. Doppler parameters are consistent with    abnormal left ventricular relaxation (grade 1 diastolic    dysfunction). Doppler parameters are consistent with high    ventricular filling pressure.  - Aortic valve: There was very mild stenosis. There was mild    regurgitation.  - Mitral valve: Calcified annulus.  - Left atrium: The atrium was mildly dilated  Patient Profile     74 y.o. female with a history of tobacco use, hypertension, hyperlipidemia, mild aortic stenosis, type 2 diabetes, small cell lung cancer with large left upper lobe mass and solitary brain mets diagnosed in 2024 undergoing immunotherapy every 4 weeks and completed systemic chemotherapy/radiation to the brain met last year, early-stage adenocarcinoma of the colon with surgical resection on hold due to lung cancer treatment who presented with chest pain described as severe indigestion with radiation to the back arm discomfort.  Patient had a greasy meal prior to falling asleep and woke up with the symptoms.  Tums did not help and called 911.  She received aspirin  324 mg and nitroglycerin  and symptoms resolved.  Troponin was minimally elevated and flat x 3 (51-> 46-> 57)  Assessment & Plan   Atypical chest pain in the setting of immune checkpoint inhibitor use concern for myopericarditis - patient had persistent chest discomfort/burning about 1 to 2 hours after eating a greasy meal and improved with aspirin  and nitroglycerin  but still with inspiratory discomfort.  Troponin mildly elevated but flat x 3 despite prolonged chest pain and therefore unlikely ACS.  Has not had any exertional symptoms  lately.  Possible myopericarditis given diffuse ST elevation and minimal troponin elevation since she recently had cycle #6 durvalumab .  Continue aspirin , heparin  and statin pending further workup as below.  Stop Aggrastat .  Ideally would get a cardiac MRI but unfortunately she cannot lie flat for long periods of time due to  back pain and is declining CMR at this time.  Will check an echocardiogram to evaluate for wall motion abnormalities and pericardial effusion, lipid panel, ESR and CRP.  Will consider further ischemic workup and/or steroids pending echocardiogram findings.   Addendum 1:00 PM  ESR 46, CRP 7.2, echo with normal LV function and no pericardial effusion Unable to be added to the CCTA schedule today to rule out CAD - would have to be done Monday or outpatient.  Will start on colchicine  and not steroids since she has a normal EF and is asymptomatic now. Continues to decline CMR due to back pain which would be very beneficial  Would consider holding Durvalumab  but will defer to oncology since we dont have an MRI for confirmation  New onset atrial fibrillation in the setting of immune checkpoint inhibitor use CHA2DS2-VASc: 4 (age, sex, hypertension, diabetes).  Noted on telemetry started at 7:45 am while in the ED with relatively well-controlled heart rate.  Will confirm with an EKG.  Continue heparin  for now in the event she may need cardiac catheterization then transition to DOAC.  Continue metoprolol  succinate Addendum 1:00 PM  Noted to be in afib on telemetry from 7:45 am to 9:20 am then converted back to NSR. 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  Consider oncology consult to weigh in on potential adverse effects of immune checkpoint  inhibitor as above  Early stage colon adenocarcinoma  Mild aortic stenosis - noted on echo in 2019  Hypertension  Hyperlipidemia  Mild aortic stenosis  Type 2 diabetes  Tobacco use       For questions or updates, please contact [facility] Please consult [email or web address] for contact info under        Signed, Emeline Calender, DO 02/11/2024, 7:07 AM    "

## 2024-02-11 NOTE — ED Notes (Signed)
 PT refused to have her sugar checked at [facility]

## 2024-02-12 ENCOUNTER — Other Ambulatory Visit (HOSPITAL_COMMUNITY): Payer: Self-pay

## 2024-02-12 ENCOUNTER — Other Ambulatory Visit: Payer: Self-pay | Admitting: Cardiology

## 2024-02-12 DIAGNOSIS — C3490 Malignant neoplasm of unspecified part of unspecified bronchus or lung: Secondary | ICD-10-CM | POA: Diagnosis not present

## 2024-02-12 DIAGNOSIS — E785 Hyperlipidemia, unspecified: Secondary | ICD-10-CM | POA: Diagnosis not present

## 2024-02-12 DIAGNOSIS — R2681 Unsteadiness on feet: Secondary | ICD-10-CM | POA: Diagnosis not present

## 2024-02-12 DIAGNOSIS — I480 Paroxysmal atrial fibrillation: Secondary | ICD-10-CM | POA: Diagnosis not present

## 2024-02-12 DIAGNOSIS — I4891 Unspecified atrial fibrillation: Secondary | ICD-10-CM

## 2024-02-12 DIAGNOSIS — R0789 Other chest pain: Secondary | ICD-10-CM | POA: Diagnosis not present

## 2024-02-12 DIAGNOSIS — I10 Essential (primary) hypertension: Secondary | ICD-10-CM | POA: Diagnosis not present

## 2024-02-12 DIAGNOSIS — E039 Hypothyroidism, unspecified: Secondary | ICD-10-CM | POA: Diagnosis not present

## 2024-02-12 DIAGNOSIS — J019 Acute sinusitis, unspecified: Secondary | ICD-10-CM | POA: Diagnosis not present

## 2024-02-12 DIAGNOSIS — I409 Acute myocarditis, unspecified: Secondary | ICD-10-CM | POA: Diagnosis not present

## 2024-02-12 DIAGNOSIS — R079 Chest pain, unspecified: Secondary | ICD-10-CM | POA: Diagnosis not present

## 2024-02-12 DIAGNOSIS — E119 Type 2 diabetes mellitus without complications: Secondary | ICD-10-CM | POA: Diagnosis not present

## 2024-02-12 DIAGNOSIS — F17220 Nicotine dependence, chewing tobacco, uncomplicated: Secondary | ICD-10-CM | POA: Diagnosis not present

## 2024-02-12 DIAGNOSIS — R011 Cardiac murmur, unspecified: Secondary | ICD-10-CM | POA: Diagnosis not present

## 2024-02-12 DIAGNOSIS — I350 Nonrheumatic aortic (valve) stenosis: Secondary | ICD-10-CM | POA: Diagnosis not present

## 2024-02-12 LAB — CBC
HCT: 36.2 % (ref 36.0–46.0)
Hemoglobin: 12 g/dL (ref 12.0–15.0)
MCH: 30.3 pg (ref 26.0–34.0)
MCHC: 33.1 g/dL (ref 30.0–36.0)
MCV: 91.4 fL (ref 80.0–100.0)
Platelets: 255 K/uL (ref 150–400)
RBC: 3.96 MIL/uL (ref 3.87–5.11)
RDW: 14.5 % (ref 11.5–15.5)
WBC: 6.2 K/uL (ref 4.0–10.5)
nRBC: 0 % (ref 0.0–0.2)

## 2024-02-12 LAB — GLUCOSE, CAPILLARY
Glucose-Capillary: 109 mg/dL — ABNORMAL HIGH (ref 70–99)
Glucose-Capillary: 139 mg/dL — ABNORMAL HIGH (ref 70–99)

## 2024-02-12 MED ORDER — PANTOPRAZOLE SODIUM 40 MG PO TBEC
40.0000 mg | DELAYED_RELEASE_TABLET | Freq: Every day | ORAL | Status: DC
  Filled 2024-02-12: qty 1

## 2024-02-12 MED ORDER — DOXYCYCLINE HYCLATE 100 MG PO TABS
100.0000 mg | ORAL_TABLET | Freq: Two times a day (BID) | ORAL | 0 refills | Status: DC
  Filled 2024-02-12: qty 13, 7d supply, fill #0

## 2024-02-12 MED ORDER — PANTOPRAZOLE SODIUM 40 MG PO TBEC
40.0000 mg | DELAYED_RELEASE_TABLET | Freq: Every day | ORAL | 0 refills | Status: AC
  Filled 2024-02-12: qty 30, 30d supply, fill #0

## 2024-02-12 MED ORDER — DOXYCYCLINE HYCLATE 100 MG PO TABS: 100.0000 mg | ORAL_TABLET | Freq: Two times a day (BID) | ORAL | Status: AC

## 2024-02-12 MED ORDER — APIXABAN 5 MG PO TABS
5.0000 mg | ORAL_TABLET | Freq: Two times a day (BID) | ORAL | 2 refills | Status: DC
  Filled 2024-02-12: qty 60, 30d supply, fill #0

## 2024-02-12 MED ORDER — DOXYCYCLINE HYCLATE 100 MG PO TABS
100.0000 mg | ORAL_TABLET | Freq: Two times a day (BID) | ORAL | Status: DC
  Administered 2024-02-12: 100 mg via ORAL
  Filled 2024-02-12: qty 1

## 2024-02-12 NOTE — Progress Notes (Signed)
 Patient c/o chest pain shortly after going to the restroom. Patient states pain is 3/10 and is located mid upper chest and being very hot. She states it feels like a hot flash. Patient states it may be heartburn. Vitals and EKG in the chart. Patient offered medication for heartburn but patient declines at this time. [facility]py[facility] notified.

## 2024-02-12 NOTE — Plan of Care (Signed)

## 2024-02-12 NOTE — Evaluation (Signed)
 Physical Therapy Evaluation Patient Details Name: Jasmine Long MRN: [id number] DOB: 1950-11-23 Today's Date: 02/12/2024  History of Present Illness  Pt is a 74 y.o. F presenting to [facility] on 02/10/24 with chest pain that radiated to her back. Pt aslo presents with numbness in her L arm and L shoulder discomfort. Coronary CTA shows nonobstructive CAD. Pt admitted with A-fib. PMH is significant for anxiety, CA, DM T2, HTN, hypothyroidism, and HLD.  Clinical Impression  Prior to admittance, pt was mobilizing independently to occasional mod I with SPC when feeling fatigued or weak; pt is indep with ADLs. Pt presents to evaluation with deficits in mobility, activity tolerance, power, and balance. Pt was able to ambulate short room level distances without an AD and no physical assistance (see gait section). Pt declined mobilizing in hallway reporting, I have already walked multiple times to the bathroom and back. PT will continue to treat pt while she is admitted. Recommending pt continue follow-up PT at [facility] to address remaining mobility deficits and optimize return to PLOF.           BP supine: 140/47, HR: 80 bpm, SpO2 93% on RA  If plan is discharge home, recommend the following: Assist for transportation;Help with stairs or ramp for entrance   Can travel by private vehicle        Equipment Recommendations None recommended by PT  Recommendations for Other Services       Functional Status Assessment Patient has had a recent decline in their functional status and demonstrates the ability to make significant improvements in function in a reasonable and predictable amount of time.     Precautions / Restrictions Precautions Precautions: Fall Recall of Precautions/Restrictions: Intact Restrictions Weight Bearing Restrictions Per Provider Order: No      Mobility  Bed Mobility Overal bed mobility: Modified Independent             General bed mobility comments: Pt utilized log roll  technique for supine <> sit (recent T12 compression fx). Increased time to complete.    Transfers Overall transfer level: Modified independent Equipment used: None               General transfer comment: STS from EOB, pushing up from bed with BUE and then placing SPC in RUE. Pt lacks trunk extension, displaying slight flexion in stance with rounded shoulders.    Ambulation/Gait Ambulation/Gait assistance: Supervision Gait Distance (Feet): 15 Feet (x2) Assistive device: None Gait Pattern/deviations: Step-through pattern, Decreased stance time - left, Decreased step length - right, Decreased weight shift to left, Knee flexed in stance - right, Knee flexed in stance - left, Trunk flexed Gait velocity: reduced Gait velocity interpretation: <1.8 ft/sec, indicate of risk for recurrent falls   General Gait Details: Pt initially ambulates with SPC in RUE for 15' and then ambulated without AD 15'. Pt demonstrates reciprocal gait pattern with reduced gait speed, reduced stride, knee flexion bilaterally in stance.  Stairs            Wheelchair Mobility     Tilt Bed    Modified Rankin (Stroke Patients Only)       Balance Overall balance assessment: Needs assistance Sitting-balance support: No upper extremity supported, Feet supported Sitting balance-Leahy Scale: Good Sitting balance - Comments: seated EOB   Standing balance support: No upper extremity supported, During functional activity Standing balance-Leahy Scale: Fair Standing balance comment: able to shift weight throughout gait but does display mild R lateral LOB when ambulating without AD, grabbing for  counter for improved stability                             Pertinent Vitals/Pain Pain Assessment Pain Assessment: No/denies pain    Home Living Family/patient expects to be discharged to:: Private residence Living Arrangements: Children (son) Available Help at Discharge: Family;Home health;Available  PRN/intermittently Type of Home: House Home Access: Stairs to enter Entrance Stairs-Rails: Doctor, General Practice of Steps: 3 Alternate Level Stairs-Number of Steps: 15 Home Layout: Two level (pt currently sleeping on the couch) Home Equipment: Cane - single point;Crutches      Prior Function Prior Level of Function : Independent/Modified Independent;Driving             Mobility Comments: indep without AD, but reports occasionally she will use a SPC when feeling weak or fatigued. goes to [facility] 2x/week following T12 compression fx ADLs Comments: indep     Extremity/Trunk Assessment   Upper Extremity Assessment Upper Extremity Assessment: Overall WFL for tasks assessed    Lower Extremity Assessment Lower Extremity Assessment: Generalized weakness    Cervical / Trunk Assessment Cervical / Trunk Assessment: Kyphotic  Communication   Communication Communication: No apparent difficulties    Cognition Arousal: Alert Behavior During Therapy: WFL for tasks assessed/performed   PT - Cognitive impairments: No apparent impairments                         Following commands: Intact       Cueing Cueing Techniques: Verbal cues, Gestural cues     General Comments General comments (skin integrity, edema, etc.): BP supine: 140/47, HR: 80 bpm, SpO2 93% on RA    Exercises     Assessment/Plan    PT Assessment Patient needs continued PT services  PT Problem List Decreased strength;Decreased range of motion;Decreased activity tolerance;Decreased balance;Decreased mobility;Decreased coordination       PT Treatment Interventions Gait training;Stair training;DME instruction;Functional mobility training;Therapeutic activities;Therapeutic exercise;Balance training;Manual techniques;Modalities    PT Goals (Current goals can be found in the Care Plan section)  Acute Rehab PT Goals Patient Stated Goal: to go home PT Goal Formulation: With patient Time For  Goal Achievement: 02/26/24 Potential to Achieve Goals: Good Additional Goals Additional Goal #1: pt will score > 19 on DGI demonstrating improved balance and reduced risk of falls    Frequency Min 1X/week     Co-evaluation               AM-PAC PT 6 Clicks Mobility  Outcome Measure Help needed turning from your back to your side while in a flat bed without using bedrails?: None Help needed moving from lying on your back to sitting on the side of a flat bed without using bedrails?: None Help needed moving to and from a bed to a chair (including a wheelchair)?: None Help needed standing up from a chair using your arms (e.g., wheelchair or bedside chair)?: None Help needed to walk in hospital room?: A Little Help needed climbing 3-5 steps with a railing? : Total 6 Click Score: 20    End of Session Equipment Utilized During Treatment: Gait belt Activity Tolerance: Patient tolerated treatment well Patient left: in bed;with call bell/phone within reach Nurse Communication: Mobility status PT Visit Diagnosis: Unsteadiness on feet (R26.81);Other abnormalities of gait and mobility (R26.89);Muscle weakness (generalized) (M62.81)    Time: 9161-9141 PT Time Calculation (min) (ACUTE ONLY): 20 min   Charges:   PT Evaluation $  PT Eval Low Complexity: 1 Low   PT General Charges $$ ACUTE PT VISIT: 1 Visit         Leontine Hilt DPT Acute Rehab Services [phone] Prefer contact via chat   Leontine NOVAK Kalyn Hofstra 02/12/2024, 9:36 AM

## 2024-02-12 NOTE — Discharge Summary (Addendum)
 " Physician Discharge Summary   Patient: Jasmine Long MRN: [id number] DOB: Mar 11, 1950  Admit date:     02/10/2024  Discharge date: 02/12/24  Discharge Physician: MDALA-GAUSI, GOLDEN PILLOW   PCP: Sagardia, Miguel Jose, MD   Recommendations at discharge:   Follow-up with cardiology  Discharge Diagnoses: Principal Problem:   Chest pain Active Problems:   Hypothyroid   Nicotine  addiction   HTN (hypertension), benign   Heart murmur   Dyslipidemia associated with type 2 diabetes mellitus (HCC)   Dyslipidemia   Small cell lung cancer (HCC)   Aortic valve stenosis   Elevated troponin   Type 2 diabetes mellitus without complications (HCC)   Malignant neoplasm of lung (HCC)   Thyroid  disease   Hyperlipidemia   Acute myocarditis  Resolved Problems:   * No resolved hospital problems. *  Hospital Course: 74 year old female with history of ongoing tobacco use, smokes 1 pack a day, hypertension, hyperlipidemia,  small cell lung cancer with a large left upper lobe mass, solitary brain met diagnosed on 2024, currently getting immunotherapy who presented to the Emergency Department with complaint of chest pain.  Initial troponin was 51.  D-dimer mildly elevated.  Cardiology consulted.  Also found to have new onset A-fib and was started on a heparin  drip.  The rest of the hospital course is in problem-based format below:    Assessment and Plan:  Chest pain:  Presented with chest pain, mild elevated troponin on presentation.  EKG changes.   Cardiology was consulted. There was initial concern for myopericarditis.  She was started on colchicine . He coronary CT was done and showed a coronary calcium  score of 30.2. Patient had an echocardiogram which revealed EF 60 to 65%, no pericardial effusion. The cardiology team ultimately felt the patient's pain was noncardiac and was most likely gastrointestinal in origin. She was started on a PPI.    New onset A-fib Noted on telemetry on  1/16. Cardiology was unable to pull up strips. She had no further episodes of atrial fibrillation. Patient was initially started on a heparin  drip and transitioned to Eliquis . As the diagnosis was not confirmed, a 2-week event monitor was ordered at discharge. Given the above, anticoagulation was discontinued at discharge. Patient will follow-up with cardiology.   Metastatic lung cancer/colon cancer:  Diagnosed in 2024.  Currently on immunotherapy, follows with oncology.   She completed systemic chemotherapy and radiation to the brain met last year.  Also has early stage adenocarcinoma of the lung.   Surgical resection plan is on hold due to concurrent lung cancer treatment.   She was seen by Oncology while hospitalized and will follow-up with oncology as an outpatient.   Hypertension: Home medications were continued.   Prediabetes: A1c of 5.8.   Hyperlipidemia: Continued statin   Tobacco use: Counseled cessation.  Continue nicotine  patch.  Smokes 1 pack a day.   Hypothyroidism: Continued Synthroid   Acute on chronic sinusitis: Patient reported symptoms prior to discharge. She states she is usually treated with doxycycline  by her ENT specialist. A course of doxycycline  was started prior to discharge.        Consultants: Cardiology, oncology Procedures performed: n/a  Disposition: Home Diet recommendation:  Discharge Diet Orders (From admission, onward)     Start     Ordered   02/12/24 0000  Diet - low sodium heart healthy        02/12/24 1239           Carb modified diet DISCHARGE MEDICATION: Allergies as  of 02/12/2024       Reactions   Other Other (See Comments)   Year long allergies        Medication List     STOP taking these medications    clonazePAM  1 MG tablet Commonly known as: KLONOPIN        TAKE these medications    ALPRAZolam  0.5 MG tablet Commonly known as: XANAX  Take 1 tablet by mouth twice daily as needed for anxiety   atorvastatin  20  MG tablet Commonly known as: LIPITOR Take 1 tablet (20 mg total) by mouth daily.   buPROPion  300 MG 24 hr tablet Commonly known as: WELLBUTRIN  XL Take 1 tablet (300 mg total) by mouth daily.   busPIRone  7.5 MG tablet Commonly known as: BUSPAR  Take 1 tablet (7.5 mg total) by mouth 2 (two) times daily.   cyclobenzaprine  5 MG tablet Commonly known as: FLEXERIL  Take 5 mg by mouth every 8 (eight) hours as needed.   doxycycline  100 MG tablet Commonly known as: VIBRA -TABS Take 1 tablet (100 mg total) by mouth 2 (two) times daily for 6 days.   escitalopram  10 MG tablet Commonly known as: Lexapro  Take 1 tablet (10 mg total) by mouth daily.   levothyroxine  125 MCG tablet Commonly known as: SYNTHROID  Take 1 tablet (125 mcg total) by mouth daily before breakfast.   metFORMIN  1000 MG tablet Commonly known as: GLUCOPHAGE  Take 1 tablet (1,000 mg total) by mouth 2 (two) times daily with a meal.   metoprolol  succinate 25 MG 24 hr tablet Commonly known as: TOPROL -XL Take 1 tablet (25 mg total) by mouth daily.   ondansetron  8 MG tablet Commonly known as: ZOFRAN  Take 1 tablet (8 mg total) by mouth every 8 (eight) hours as needed for nausea or vomiting.   pantoprazole  40 MG tablet Commonly known as: PROTONIX  Take 1 tablet (40 mg total) by mouth daily.   prochlorperazine  10 MG tablet Commonly known as: COMPAZINE  Take 10 mg by mouth every 6 (six) hours as needed for nausea or vomiting.   valsartan -hydrochlorothiazide  160-12.5 MG tablet Commonly known as: DIOVAN -HCT Take 1 tablet by mouth daily.        Discharge Exam: Filed Weights   02/10/24 1623 02/11/24 1236  Weight: 90 kg 88.7 kg   Physical Exam on Day of Discharge   General: Alert, cheerful, oriented X3  Oral cavity: moist mucous membranes  Neck: supple  Chest: clear to auscultation. No crackles, no wheezes  CVS: S1,S2 RRR. No murmurs  Abd: No distention, soft, non-tender. No masses palpable  Extr: No edema     Condition at discharge: stable  The results of significant diagnostics from this hospitalization (including imaging, microbiology, ancillary and laboratory) are listed below for reference.   Imaging Studies: CT CORONARY MORPH W/CTA COR W/SCORE W/CA W/CM &/OR WO/CM Addendum Date: 02/11/2024 ADDENDUM REPORT: 02/11/2024 20:02 EXAM: OVER-READ INTERPRETATION  CT CHEST The following report is an over-read performed by radiologist Dr. Oneil Devonshire of [facility] on 02/11/2024. This over-read does not include interpretation of cardiac or coronary anatomy or pathology. The coronary calcium  score/coronary CTA interpretation by the cardiologist is attached. COMPARISON:  11/22/2023 FINDINGS: Cardiovascular: Atherosclerotic calcifications of the thoracic aorta are noted. No aneurysmal dilatation is seen. No dissection is noted. No pulmonary emboli are seen. Mediastinum/Nodes: There are no enlarged lymph nodes within the visualized mediastinum. Lungs/Pleura: There is no pleural effusion. Mild emphysematous changes are noted. Previously seen left upper lobe subpleural mass is not evaluated on this exam. Upper abdomen:  No significant findings in the visualized upper abdomen. Musculoskeletal/Chest wall: No chest wall mass or suspicious osseous findings within the visualized chest. Changes of prior vertebral augmentation are noted. IMPRESSION: Aortic Atherosclerosis (ICD10-I70.0) and Emphysema (ICD10-J43.9). Electronically Signed   By: Oneil Devonshire M.D.   On: 02/11/2024 20:02   Result Date: 02/11/2024 CLINICAL DATA:  35F with hypertension, hyperlipidemia, mild aortic stenosis, diabetes, lung cancer, and chest pain. EXAM: Cardiac/Coronary  CT TECHNIQUE: The patient was scanned on a [employer]. PROTOCOL: A 120 kV prospective scan was triggered in the descending thoracic aorta at 111 HU's. Axial non-contrast 3 mm slices were carried out through the heart. The data set was analyzed on a dedicated work  station and scored using the Agatson method. Gantry rotation speed was 250 msecs and collimation was .6 mm. No beta blockade and 0.8 mg of sl NTG was given. The 3D data set was reconstructed in 5% intervals of the 67-82 % of the R-R cycle. Diastolic phases were analyzed on a dedicated work station using MPR, MIP and VRT modes. The patient received 80 cc of contrast. FINDINGS: Aorta: Normal size.  Aortic atherosclerosis.  No dissection. Aortic Valve: Trileaflet. Aortic valve calcification. Aortic valve score 606. Coronary Arteries:  Normal coronary origin.  Right dominance. RCA is a large dominant artery that gives rise to PDA and PLVB. There is minimal (<25%) calcified plaque proximally. Left main is a large artery that gives rise to LAD, RI, and LCX arteries. LAD is a large vessel that has no plaque. RI is a large vessel without plaque. LCX is a non-dominant artery that gives rise to one large OM1 branch. There is no plaque. Coronary Calcium  Score: Left main: 0 Left anterior descending artery: 0 Left circumflex artery: 0 Right coronary artery: 30.2 Total: 30.2 Percentile: 54th Other findings: Normal pulmonary vein drainage into the left atrium. Normal let atrial appendage without a thrombus. Normal size of the pulmonary artery. Non-cardiac: See separate report from [facility]. IMPRESSION: 1. Coronary calcium  score of 30.2. This was 54th percentile for age-, sex, and race-matched controls. 2. There is minimal (<25%) calcified plaque in the proximal RCA. CAD-RADS 1. 3. Normal coronary origin with right dominance. 4. Aortic valve calcification. Aortic valve score 606. 5. Aortic atherosclerosis. RECOMMENDATIONS: CAD-RADS 1: Minimal non-obstructive CAD (0-24%). Consider non-atherosclerotic causes of chest pain. Consider preventive therapy and risk factor modification. Annabella Scarce, MD Electronically Signed: By: Annabella Scarce M.D. On: 02/11/2024 15:59   ECHOCARDIOGRAM COMPLETE Result Date:  02/11/2024    ECHOCARDIOGRAM REPORT   Patient Name:   Jasmine Long Date of Exam: 02/11/2024 Medical Rec #:  [id number]  Height:       69.0 in Accession #:    [id number] Weight:       198.4 lb Date of Birth:  Apr 02, 1950   BSA:          2.059 m Patient Age:    73 years   BP:           116/44 mmHg Patient Gender: F          HR:           75 bpm. Exam Location:  Inpatient Procedure: 2D Echo, Color Doppler and Cardiac Doppler (Both Spectral and Color            Flow Doppler were utilized during procedure). Indications:    Chest Pain R07.9  History:        Patient has prior history of Echocardiogram examinations, most  recent 05/25/2017. Signs/Symptoms:Murmur; Risk                 Factors:Hypertension.  Sonographer:    Sydnee Wilson RDCS Referring Phys: [phone] CLAUDIA CLAIBORNE IMPRESSIONS  1. Left ventricular ejection fraction, by estimation, is 60 to 65%. The left ventricle has normal function. The left ventricle has no regional wall motion abnormalities. There is moderate concentric left ventricular hypertrophy. Left ventricular diastolic parameters were normal. Elevated left atrial pressure.  2. Right ventricular systolic function is normal. The right ventricular size is normal.  3. Mild mitral valve regurgitation. Moderate mitral annular calcification.  4. AV is thickened, calcified with mildly restricted motion. Peak and mean gradients through the valve are 22 and 13 mm Hg respectively. . The aortic valve is tricuspid. Aortic valve regurgitation is moderate.  5. The inferior vena cava is normal in size with greater than 50% respiratory variability, suggesting right atrial pressure of 3 mmHg. FINDINGS  Left Ventricle: Left ventricular ejection fraction, by estimation, is 60 to 65%. The left ventricle has normal function. The left ventricle has no regional wall motion abnormalities. The left ventricular internal cavity size was normal in size. There is  moderate concentric left ventricular hypertrophy. Left  ventricular diastolic parameters were normal. Elevated left atrial pressure. Right Ventricle: The right ventricular size is normal. Right vetricular wall thickness was not assessed. Right ventricular systolic function is normal. Left Atrium: Left atrial size was normal in size. Right Atrium: Right atrial size was normal in size. Pericardium: There is no evidence of pericardial effusion. Mitral Valve: There is mild thickening of the mitral valve leaflet(s). Moderate mitral annular calcification. Mild mitral valve regurgitation. Tricuspid Valve: The tricuspid valve is normal in structure. Tricuspid valve regurgitation is trivial. Aortic Valve: AV is thickened, calcified with mildly restricted motion. Peak and mean gradients through the valve are 22 and 13 mm Hg respectively. The aortic valve is tricuspid. Aortic valve regurgitation is moderate. Aortic regurgitation PHT measures 116 msec. Aortic valve mean gradient measures 11.5 mmHg. Aortic valve peak gradient measures 20.6 mmHg. Aortic valve area, by VTI measures 2.64 cm. Pulmonic Valve: The pulmonic valve was normal in structure. Pulmonic valve regurgitation is not visualized. Aorta: The aortic root and ascending aorta are structurally normal, with no evidence of dilitation. Venous: The inferior vena cava is normal in size with greater than 50% respiratory variability, suggesting right atrial pressure of 3 mmHg. IAS/Shunts: No atrial level shunt detected by color flow Doppler.  LEFT VENTRICLE PLAX 2D LVIDd:         4.10 cm      Diastology LVIDs:         2.90 cm      LV e' medial:    6.64 cm/s LV PW:         1.50 cm      LV E/e' medial:  14.9 LV IVS:        1.50 cm      LV e' lateral:   6.74 cm/s LVOT diam:     2.06 cm      LV E/e' lateral: 14.7 LV SV:         128 LV SV Index:   62 LVOT Area:     3.33 cm  LV Volumes (MOD) LV vol d, MOD A2C: 146.0 ml LV vol d, MOD A4C: 140.0 ml LV vol s, MOD A2C: 47.0 ml LV vol s, MOD A4C: 53.7 ml LV SV MOD A2C:     99.0 ml LV SV  MOD A4C:     140.0 ml LV SV MOD BP:      96.2 ml RIGHT VENTRICLE RV S prime:     13.50 cm/s TAPSE (M-mode): 2.2 cm LEFT ATRIUM             Index        RIGHT ATRIUM           Index LA diam:        3.72 cm 1.81 cm/m   RA Area:     17.00 cm LA Vol (A2C):   59.9 ml 29.09 ml/m  RA Volume:   46.70 ml  22.68 ml/m LA Vol (A4C):   67.7 ml 32.88 ml/m LA Biplane Vol: 65.7 ml 31.91 ml/m  AORTIC VALVE AV Area (Vmax):    2.65 cm AV Area (Vmean):   2.48 cm AV Area (VTI):     2.64 cm AV Vmax:           227.00 cm/s AV Vmean:          157.750 cm/s AV VTI:            0.484 m AV Peak Grad:      20.6 mmHg AV Mean Grad:      11.5 mmHg LVOT Vmax:         180.20 cm/s LVOT Vmean:        117.400 cm/s LVOT VTI:          0.383 m LVOT/AV VTI ratio: 0.79 AI PHT:            116 msec  AORTA Ao Root diam: 3.19 cm Ao Asc diam:  3.33 cm MITRAL VALVE                TRICUSPID VALVE MV Area (PHT): 2.78 cm     TR Peak grad:   22.8 mmHg MV Decel Time: 273 msec     TR Vmax:        239.00 cm/s MV E velocity: 99.20 cm/s MV A velocity: 129.00 cm/s  SHUNTS MV E/A ratio:  0.77         Systemic VTI:  0.38 m                             Systemic Diam: 2.06 cm Vina Gull MD Electronically signed by Vina Gull MD Signature Date/Time: 02/11/2024/12:38:21 PM    Final    DG Chest Portable 1 View Result Date: 02/10/2024 CLINICAL DATA:  Chest pain EXAM: PORTABLE CHEST 1 VIEW COMPARISON:  May 13, 2022.  November 22, 2023. FINDINGS: The heart size and mediastinal contours are within normal limits. Right lung is clear. Nodular opacity is noted in left upper lobe which most likely corresponds to left upper lobe subpleural mass noted on recent CT scan. The visualized skeletal structures are unremarkable. IMPRESSION: Nodular opacity seen in left upper lobe which most likely corresponds to subpleural mass noted on recent CT scan. Electronically Signed   By: Lynwood Landy Raddle M.D.   On: 02/10/2024 17:18    Microbiology: Results for orders placed or performed  during the hospital encounter of 08/10/23  Urine Culture     Status: None   Collection Time: 08/10/23  7:16 PM   Specimen: Urine, Clean Catch  Result Value Ref Range Status   Specimen Description   Final    URINE, CLEAN CATCH Performed at [facility], [address] [place], [place] [address]  Special Requests   Final    NONE Performed at [facility], [address], [place], [place] [address]    Culture   Final    NO GROWTH Performed at [facility], [address], [place], [place] [address]    Report Status 08/11/2023 FINAL  Final    Labs: CBC: Recent Labs  Lab 02/10/24 1654 02/10/24 1701 02/11/24 0355 02/12/24 0624  WBC 9.2  --  7.1 6.2  HGB 12.5 12.6 11.2* 12.0  HCT 39.2 37.0 34.7* 36.2  MCV 94.5  --  92.5 91.4  PLT 291  --  266 255   Basic Metabolic Panel: Recent Labs  Lab 02/10/24 1654 02/10/24 1701 02/11/24 0355  NA 137 137 136  K 4.8 4.4 4.1  CL 104 109 102  CO2 19*  --  24  GLUCOSE 120* 121* 122*  BUN 23 24* 18  CREATININE 0.99 1.00 1.06*  CALCIUM  9.7  --  9.4  MG  --   --  1.9   Liver Function Tests: Recent Labs  Lab 02/11/24 0355  AST 14*  ALT 9  ALKPHOS 110  BILITOT 1.0  PROT 6.9  ALBUMIN 4.1   CBG: Recent Labs  Lab 02/11/24 1833 02/11/24 2151 02/12/24 0742 02/12/24 1156  GLUCAP 91 122* 109* 139*    Discharge time spent: greater than 30 minutes.  Signed: MDALA-GAUSI, Jasmine Flaum AGATHA, MD [facility] 02/12/2024 "

## 2024-02-12 NOTE — Progress Notes (Addendum)
 "   Progress Note  Patient Name: Jasmine Long Date of Encounter: 02/12/2024  Primary Cardiologist: None  Subjective   No events.  Inpatient Medications    Scheduled Meds:  apixaban   5 mg Oral BID   aspirin  EC  81 mg Oral Daily   atorvastatin   20 mg Oral Daily   colchicine   0.6 mg Oral BID   doxycycline   100 mg Oral Q12H   escitalopram   10 mg Oral Daily   famotidine   20 mg Oral Daily   levothyroxine   125 mcg Oral QAC breakfast   metoprolol  succinate  25 mg Oral Daily   nicotine   14 mg Transdermal Daily   sodium chloride  flush  10-40 mL Intracatheter Q12H   sodium chloride  flush  3 mL Intravenous Q12H   Continuous Infusions:  PRN Meds: acetaminophen  **OR** acetaminophen , albuterol , ALPRAZolam , bisacodyl , cyclobenzaprine , nitroGLYCERIN , ondansetron  **OR** ondansetron  (ZOFRAN ) IV, sodium chloride  flush   Vital Signs    Vitals:   02/12/24 0026 02/12/24 0605 02/12/24 0810 02/12/24 1157  BP: 137/65 (!) 146/51 93/62 (!) 125/51  Pulse: 74 66 66 71  Resp:  18 16 18   Temp: 97.8 F (36.6 C) 98.3 F (36.8 C) 98.1 F (36.7 C) 98.2 F (36.8 C)  TempSrc: Oral Oral Oral Oral  SpO2: 93% 95% 93% 95%  Weight:      Height:        Intake/Output Summary (Last 24 hours) at 02/12/2024 1205 Last data filed at 02/11/2024 2100 Gross per 24 hour  Intake 120 ml  Output --  Net 120 ml   Filed Weights   02/10/24 1623 02/11/24 1236  Weight: 90 kg 88.7 kg    Telemetry     Personally reviewed.  NSR.  ECG   Not performed today.  Physical Exam   GEN: No acute distress.   Neck: No JVD. Cardiac: RRR, no murmur, rub, or gallop.  Respiratory: Nonlabored. Clear to auscultation bilaterally. GI: Soft, nontender, bowel sounds present. MS: No edema; No deformity. Neuro:  Nonfocal. Psych: Alert and oriented x 3. Normal affect.  Labs    Chemistry Recent Labs  Lab 02/10/24 1654 02/10/24 1701 02/11/24 0355  NA 137 137 136  K 4.8 4.4 4.1  CL 104 109 102  CO2 19*  --  24  GLUCOSE  120* 121* 122*  BUN 23 24* 18  CREATININE 0.99 1.00 1.06*  CALCIUM  9.7  --  9.4  PROT  --   --  6.9  ALBUMIN  --   --  4.1  AST  --   --  14*  ALT  --   --  9  ALKPHOS  --   --  110  BILITOT  --   --  1.0  GFRNONAA >60  --  56*  ANIONGAP 13  --  10     Hematology Recent Labs  Lab 02/10/24 1654 02/10/24 1701 02/11/24 0355 02/12/24 0624  WBC 9.2  --  7.1 6.2  RBC 4.15  --  3.75* 3.96  HGB 12.5 12.6 11.2* 12.0  HCT 39.2 37.0 34.7* 36.2  MCV 94.5  --  92.5 91.4  MCH 30.1  --  29.9 30.3  MCHC 31.9  --  32.3 33.1  RDW 14.7  --  14.7 14.5  PLT 291  --  266 255    Cardiac EnzymesNo results for input(s): TROPONINIHS in the last 720 hours.  BNPNo results for input(s): BNP, PROBNP in the last 168 hours.   DDimer Recent Labs  Lab 02/10/24 1654  DDIMER 0.62*     Assessment & Plan     Noncardiac chest pain - From GI etiology.  Patient ate home-cooked meal but new recipe, greasy meals. - Presented with pain across her precordium and stomach. - Start omeprazole 40 mg once daily. - EKG showed NSR, no ischemia. - Hs troponins mildly elevated, 51>>46>>57. - Echo this admission is normal except for moderate AS. - Clinical picture and cardiac workup not consistent with myopericarditis. D/c colchicine . - CT cardiac showed nonobstructive CAD.  Atrial fibrillation, new onset - Noted on telemetry on 02/11/2024 from 7:45 AM to 9:20 AM per previous cardiology progress note. I am not able to pull up the strips as she was in the ER at that time.  Currently on telemetry floor. - Patient has 5.2-second pause, strip uploaded in media.  Will hold metoprolol . - Obtain 2-week event monitor, live on discharge. - Discontinue Eliquis  as the co-pay is around $200 ish. If event monitor confirms atrial fibrillation, can discuss DOACs versus Coumadin.  Moderate aortic valve stenosis - Outpatient surveillance with serial echocardiograms.  [facility] will sign off.   Medication  Recommendations: Continue above Other recommendations (labs, testing, etc): 2-week event monitor, live on discharge Follow up as an outpatient: Outpatient cardiology follow-up in 8 weeks   40 minutes spent in reviewing prior medical records, reports, more than 3 labs, discussion and documentation.  Signed, Diannah SHAUNNA Maywood, MD  02/12/2024, 12:05 PM    "

## 2024-02-12 NOTE — Progress Notes (Signed)
 Ordering 2 week Zio monitor for A-Fib. Results to Dr. Kriste

## 2024-02-14 ENCOUNTER — Telehealth: Payer: Self-pay

## 2024-02-14 ENCOUNTER — Ambulatory Visit (HOSPITAL_COMMUNITY)

## 2024-02-14 DIAGNOSIS — I4891 Unspecified atrial fibrillation: Secondary | ICD-10-CM

## 2024-02-14 NOTE — Progress Notes (Unsigned)
 Enrolled for [employer] to mail a ZIO AT Live Telemetry monitor to patients address on file.   Dr. Kriste to read.

## 2024-02-14 NOTE — Telephone Encounter (Signed)
 Copied from CRM [id number]. Topic: Clinical - Medical Advice >> Feb 14, 2024 12:21 PM [name] wrote: Reason for CRM: Patient called in requesting to speak with the office. Adv her that the office is on lunch and she will receive a call back

## 2024-02-15 NOTE — Progress Notes (Signed)
 Pt was unable to complete her CT C/A/P on 1/19. Pt refused oral contrast. Pt states  they told me I had early stage colon cancer NN confirmed that, however explained the CT was to evaluate her response to her treatment for her lung cancer. Pt states she will wait until she talks to her GI doctor before she'll drink contrast. When NN told her she would reschedule the appt for her, pt states not in the morning, something around this time (4pm)  NN spoke to CT tech to explain. CT tech reached out to pt to see if she could return to radiology to get the CT without oral contrast, however pt had already left facility. Cassie, Onc PA, notified of situation. Instructions per Cassie were to reschedule the CT C/A/P but without oral contrast.  NN spoke to scheduler, Mariana, this morning, explained that pt won't take oral contrast. Mariana verbalized understanding and made a note in the appt order that the pt refuses oral contrast. NN let scheduler know that the pt would prefer an afternoon appt. Scheduler states first available is 1/27 at 4pm. NN verbalized appreciation for schedulers assistance and asked that the CT be marked as a STAT read since the pt is being seen on 1/28. Scheduler verbalized understanding. NN spoke to pt this morning and gave pt details of appt. Pt verbalized understanding.

## 2024-02-18 NOTE — Telephone Encounter (Signed)
 Called patient back and she states that she has already spoken with a nurse in regards to her concerns

## 2024-02-20 NOTE — Progress Notes (Unsigned)
 [facility] OFFICE PROGRESS NOTE  Jasmine Emil Schanz, MD [address] 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   PRIOR THERAPY: 1) SRS to solitary brain metastasis under the care of Dr. Patrcia 2) systemic chemotherapy with carboplatin  for an AUC 5 and etoposide  100 mg/m on days 1, 2, and 3 IV every 3 weeks.  Imfinzi  will be added once she completes palliative radiation.  Status post 4 cycles.  This is concurrent with radiotherapy completed on April 05, 2023.  CURRENT THERAPY: Consolidation treatment with immunotherapy with Imfinzi  1500 Mg IV every 4 weeks.  First dose 08/30/2023.  Status post 6 cycles.   INTERVAL HISTORY: Jasmine Long 74 y.o. female returns to clinic today for follow-up visit.  The patient was last seen in the clinic 1 month ago by myself.  She is currently undergoing consolidation immunotherapy with Imfinzi .  She status post 6 cycles.  In the interval since being seen she presented to the hospital on 02/10/2024 with a chief complaint of chest pain.  She was also found to have new onset A-fib.  Coronary CT scan showed coronary calcium  score 30.2.  Echo revealed EF of 60-65 and there is no pericardial effusion.  Cardiology team ultimately felt that her pain was noncardiac in origin and likely gastrointestinal.  She started on a PPI.  Regarding the A-fib, she was started on Eliquis .  2-week event monitor was ordered.  Cardiology was unable to pull the strips showing A-fib therefore Eliquis  was discharged and she is going to follow-up  outpatient with cardiology.  Dr. Timmy saw the patient while admitted to the hospital.  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  Dr. Timmy does not think that she needs immunosuppressive therapy Dr. Timmy thinks that it would be nice to rechallenge her  She is currently receiving chemotherapy and reports feeling generally well, without fevers, chills, or night sweats. She denies significant nausea or vomiting, except for mild nausea yesterday attributed to anxiety. She has not experienced diarrhea, constipation, or rashes. She denies shortness of breath except with exertion, which she attributes to activity, and has no cough except in the context of ongoing sinus drainage.   She sustained a thoracic vertebral compression fracture after starting chemotherapy in December, treated with vertebroplasty. She continues to have difficulty lying on her back for more than 10-15 minutes, which limits her ability to undergo certain procedures and physical therapy exercises. She is attending physical therapy, which she finds beneficial. She was scheduled to see her neurosurgeon on 02/17/24 with Dr. Malcolm ***but ***.   She also has some thyroid  dysfunction and was endorsing weight gain.  Confirm she is taking Synthroid ?   Cough?  Sinus drainage?  Doxycycline ?  Headaches in the sinus area?  She recently had a restaging CT of the chest, abdomen, pelvis she is here today for evaluation and repeat blood work before undergoing cycle #7    MEDICAL HISTORY: Past Medical History:  Diagnosis Date   Allergy    Anxiety    Cancer (HCC)  small cell lung cancer   Complication of anesthesia    per patient she was hard to wake up   Depression    Diabetes mellitus without complication (HCC)    type 2, does not check blood sugar   Heart murmur    never has caused any problems   Hypertension    Hypothyroidism    Neuromuscular disorder (HCC)    bilateral  lower extremity - numbness   Pneumonia    as a child   Thyroid  disease    Ulcer     ALLERGIES:  is allergic to other.  MEDICATIONS:  Current Outpatient Medications  Medication Sig Dispense Refill   ALPRAZolam  (XANAX ) 0.5 MG tablet Take 1 tablet by mouth twice daily as needed for anxiety 30 tablet 2   atorvastatin  (LIPITOR) 20 MG tablet Take 1 tablet (20 mg total) by mouth daily. 90 tablet 1   buPROPion  (WELLBUTRIN  XL) 300 MG 24 hr tablet Take 1 tablet (300 mg total) by mouth daily. 90 tablet 1   busPIRone  (BUSPAR ) 7.5 MG tablet Take 1 tablet (7.5 mg total) by mouth 2 (two) times daily. 60 tablet 1   cyclobenzaprine  (FLEXERIL ) 5 MG tablet Take 5 mg by mouth every 8 (eight) hours as needed.     escitalopram  (LEXAPRO ) 10 MG tablet Take 1 tablet (10 mg total) by mouth daily. 90 tablet 3   levothyroxine  (SYNTHROID ) 125 MCG tablet Take 1 tablet (125 mcg total) by mouth daily before breakfast. 90 tablet 0   metFORMIN  (GLUCOPHAGE ) 1000 MG tablet Take 1 tablet (1,000 mg total) by mouth 2 (two) times daily with a meal. 180 tablet 1   metoprolol  succinate (TOPROL -XL) 25 MG 24 hr tablet Take 1 tablet (25 mg total) by mouth daily. 90 tablet 3   ondansetron  (ZOFRAN ) 8 MG tablet Take 1 tablet (8 mg total) by mouth every 8 (eight) hours as needed for nausea or vomiting. 30 tablet 1   pantoprazole  (PROTONIX ) 40 MG tablet Take 1 tablet (40 mg total) by mouth daily. 30 tablet 0   prochlorperazine  (COMPAZINE ) 10 MG tablet Take 10 mg by mouth every 6 (six) hours as needed for nausea or vomiting.     valsartan -hydrochlorothiazide  (DIOVAN -HCT) 160-12.5 MG tablet Take 1 tablet by mouth daily. 90 tablet 3   No current facility-administered medications for this visit.    SURGICAL HISTORY:  Past Surgical History:  Procedure Laterality Date   BREAST SURGERY Right    lumpectomy- benign   CESAREAN SECTION  1985   CHOLECYSTECTOMY  1980   open   COLONOSCOPY WITH PROPOFOL  N/A 10/30/2022   Procedure: COLONOSCOPY  WITH PROPOFOL ;  Surgeon: Rollin Dover, MD;  Location: [facility];  Service: Gastroenterology;  Laterality: N/A;   ENDOSCOPIC MUCOSAL RESECTION  10/30/2022   Procedure: ENDOSCOPIC MUCOSAL RESECTION;  Surgeon: Rollin Dover, MD;  Location: [facility];  Service: Gastroenterology;;   HEMOSTASIS CLIP PLACEMENT  10/30/2022   Procedure: HEMOSTASIS CLIP PLACEMENT;  Surgeon: Rollin Dover, MD;  Location: [facility];  Service: Gastroenterology;;   KYPHOPLASTY N/A 07/20/2023   Procedure: KYPHOPLASTY THORACIC TWELVE;  Surgeon: Louis Shove, MD;  Location: [facility];  Service: Neurosurgery;  Laterality: N/A;   NASAL SEPTUM SURGERY     POLYPECTOMY  10/30/2022   Procedure: POLYPECTOMY;  Surgeon: Rollin Dover, MD;  Location: [facility];  Service: Gastroenterology;;   ROBLEY LIFTING INJECTION  10/30/2022   Procedure: SUBMUCOSAL LIFTING INJECTION;  Surgeon: Rollin Dover, MD;  Location: [facility];  Service: Gastroenterology;;    REVIEW OF  SYSTEMS:   Review of Systems  Constitutional: Negative for appetite change, chills, fatigue, fever and unexpected weight change.  HENT:   Negative for mouth sores, nosebleeds, sore throat and trouble swallowing.   Eyes: Negative for eye problems and icterus.  Respiratory: Negative for cough, hemoptysis, shortness of breath and wheezing.   Cardiovascular: Negative for chest pain and leg swelling.  Gastrointestinal: Negative for abdominal pain, constipation, diarrhea, nausea and vomiting.  Genitourinary: Negative for bladder incontinence, difficulty urinating, dysuria, frequency and hematuria.   Musculoskeletal: Negative for back pain, gait problem, neck pain and neck stiffness.  Skin: Negative for itching and rash.  Neurological: Negative for dizziness, extremity weakness, gait problem, headaches, light-headedness and seizures.  Hematological: Negative for adenopathy. Does not bruise/bleed easily.  Psychiatric/Behavioral: Negative for confusion, depression and  sleep disturbance. The patient is not nervous/anxious.     PHYSICAL EXAMINATION:  There were no vitals taken for this visit.  ECOG PERFORMANCE STATUS: {CHL ONC ECOG [id number]  Physical Exam  Constitutional: Oriented to person, place, and time and well-developed, well-nourished, and in no distress. No distress.  HENT:  Head: Normocephalic and atraumatic.  Mouth/Throat: Oropharynx is clear and moist. No oropharyngeal exudate.  Eyes: Conjunctivae are normal. Right eye exhibits no discharge. Left eye exhibits no discharge. No scleral icterus.  Neck: Normal range of motion. Neck supple.  Cardiovascular: Normal rate, regular rhythm, normal heart sounds and intact distal pulses.   Pulmonary/Chest: Effort normal and breath sounds normal. No respiratory distress. No wheezes. No rales.  Abdominal: Soft. Bowel sounds are normal. Exhibits no distension and no mass. There is no tenderness.  Musculoskeletal: Normal range of motion. Exhibits no edema.  Lymphadenopathy:    No cervical adenopathy.  Neurological: Alert and oriented to person, place, and time. Exhibits normal muscle tone. Gait normal. Coordination normal.  Skin: Skin is warm and dry. No rash noted. Not diaphoretic. No erythema. No pallor.  Psychiatric: Mood, memory and judgment normal.  Vitals reviewed.  LABORATORY DATA: Lab Results  Component Value Date   WBC 6.2 02/12/2024   HGB 12.0 02/12/2024   HCT 36.2 02/12/2024   MCV 91.4 02/12/2024   PLT 255 02/12/2024      Chemistry      Component Value Date/Time   NA 136 02/11/2024 0355   NA 134 09/11/2022 1259   K 4.1 02/11/2024 0355   CL 102 02/11/2024 0355   CO2 24 02/11/2024 0355   BUN 18 02/11/2024 0355   BUN 12 09/11/2022 1259   CREATININE 1.06 (H) 02/11/2024 0355   CREATININE 1.05 (H) 01/26/2024 0828   CREATININE 0.98 10/30/2013 1118      Component Value Date/Time   CALCIUM  9.4 02/11/2024 0355   ALKPHOS 110 02/11/2024 0355   AST 14 (L) 02/11/2024 0355   AST 29  01/26/2024 0828   ALT 9 02/11/2024 0355   ALT 19 01/26/2024 0828   BILITOT 1.0 02/11/2024 0355   BILITOT 0.5 01/26/2024 0828       RADIOGRAPHIC STUDIES:  CT CORONARY MORPH W/CTA COR W/SCORE W/CA W/CM &/OR WO/CM Addendum Date: 02/11/2024 ADDENDUM REPORT: 02/11/2024 20:02 EXAM: OVER-READ INTERPRETATION  CT CHEST The following report is an over-read performed by radiologist Dr. Oneil Devonshire of [facility] on 02/11/2024. This over-read does not include interpretation of cardiac or coronary anatomy or pathology. The coronary calcium  score/coronary CTA interpretation by the cardiologist is attached. COMPARISON:  11/22/2023 FINDINGS: Cardiovascular: Atherosclerotic calcifications of the thoracic aorta are noted. No aneurysmal dilatation is seen. No dissection is  noted. No pulmonary emboli are seen. Mediastinum/Nodes: There are no enlarged lymph nodes within the visualized mediastinum. Lungs/Pleura: There is no pleural effusion. Mild emphysematous changes are noted. Previously seen left upper lobe subpleural mass is not evaluated on this exam. Upper abdomen: No significant findings in the visualized upper abdomen. Musculoskeletal/Chest wall: No chest wall mass or suspicious osseous findings within the visualized chest. Changes of prior vertebral augmentation are noted. IMPRESSION: Aortic Atherosclerosis (ICD10-I70.0) and Emphysema (ICD10-J43.9). Electronically Signed   By: Oneil Devonshire M.D.   On: 02/11/2024 20:02   Result Date: 02/11/2024 CLINICAL DATA:  59F with hypertension, hyperlipidemia, mild aortic stenosis, diabetes, lung cancer, and chest pain. EXAM: Cardiac/Coronary  CT TECHNIQUE: The patient was scanned on a [employer]. PROTOCOL: A 120 kV prospective scan was triggered in the descending thoracic aorta at 111 HU's. Axial non-contrast 3 mm slices were carried out through the heart. The data set was analyzed on a dedicated work station and scored using the Agatson method. Gantry  rotation speed was 250 msecs and collimation was .6 mm. No beta blockade and 0.8 mg of sl NTG was given. The 3D data set was reconstructed in 5% intervals of the 67-82 % of the R-R cycle. Diastolic phases were analyzed on a dedicated work station using MPR, MIP and VRT modes. The patient received 80 cc of contrast. FINDINGS: Aorta: Normal size.  Aortic atherosclerosis.  No dissection. Aortic Valve: Trileaflet. Aortic valve calcification. Aortic valve score 606. Coronary Arteries:  Normal coronary origin.  Right dominance. RCA is a large dominant artery that gives rise to PDA and PLVB. There is minimal (<25%) calcified plaque proximally. Left main is a large artery that gives rise to LAD, RI, and LCX arteries. LAD is a large vessel that has no plaque. RI is a large vessel without plaque. LCX is a non-dominant artery that gives rise to one large OM1 branch. There is no plaque. Coronary Calcium  Score: Left main: 0 Left anterior descending artery: 0 Left circumflex artery: 0 Right coronary artery: 30.2 Total: 30.2 Percentile: 54th Other findings: Normal pulmonary vein drainage into the left atrium. Normal let atrial appendage without a thrombus. Normal size of the pulmonary artery. Non-cardiac: See separate report from [facility]. IMPRESSION: 1. Coronary calcium  score of 30.2. This was 54th percentile for age-, sex, and race-matched controls. 2. There is minimal (<25%) calcified plaque in the proximal RCA. CAD-RADS 1. 3. Normal coronary origin with right dominance. 4. Aortic valve calcification. Aortic valve score 606. 5. Aortic atherosclerosis. RECOMMENDATIONS: CAD-RADS 1: Minimal non-obstructive CAD (0-24%). Consider non-atherosclerotic causes of chest pain. Consider preventive therapy and risk factor modification. Annabella Scarce, MD Electronically Signed: By: Annabella Scarce M.D. On: 02/11/2024 15:59   ECHOCARDIOGRAM COMPLETE Result Date: 02/11/2024    ECHOCARDIOGRAM REPORT   Patient Name:   [name]  Long Date of Exam: 02/11/2024 Medical Rec #:  [id number]  Height:       69.0 in Accession #:    [id number] Weight:       198.4 lb Date of Birth:  12-15-1950   BSA:          2.059 m Patient Age:    73 years   BP:           116/44 mmHg Patient Gender: F          HR:           75 bpm. Exam Location:  Inpatient Procedure: 2D Echo, Color Doppler and Cardiac Doppler (Both Spectral  and Color            Flow Doppler were utilized during procedure). Indications:    Chest Pain R07.9  History:        Patient has prior history of Echocardiogram examinations, most                 recent 05/25/2017. Signs/Symptoms:Murmur; Risk                 Factors:Hypertension.  Sonographer:    Sydnee Wilson RDCS Referring Phys: [phone] CLAUDIA CLAIBORNE IMPRESSIONS  1. Left ventricular ejection fraction, by estimation, is 60 to 65%. The left ventricle has normal function. The left ventricle has no regional wall motion abnormalities. There is moderate concentric left ventricular hypertrophy. Left ventricular diastolic parameters were normal. Elevated left atrial pressure.  2. Right ventricular systolic function is normal. The right ventricular size is normal.  3. Mild mitral valve regurgitation. Moderate mitral annular calcification.  4. AV is thickened, calcified with mildly restricted motion. Peak and mean gradients through the valve are 22 and 13 mm Hg respectively. . The aortic valve is tricuspid. Aortic valve regurgitation is moderate.  5. The inferior vena cava is normal in size with greater than 50% respiratory variability, suggesting right atrial pressure of 3 mmHg. FINDINGS  Left Ventricle: Left ventricular ejection fraction, by estimation, is 60 to 65%. The left ventricle has normal function. The left ventricle has no regional wall motion abnormalities. The left ventricular internal cavity size was normal in size. There is  moderate concentric left ventricular hypertrophy. Left ventricular diastolic parameters were normal. Elevated left  atrial pressure. Right Ventricle: The right ventricular size is normal. Right vetricular wall thickness was not assessed. Right ventricular systolic function is normal. Left Atrium: Left atrial size was normal in size. Right Atrium: Right atrial size was normal in size. Pericardium: There is no evidence of pericardial effusion. Mitral Valve: There is mild thickening of the mitral valve leaflet(s). Moderate mitral annular calcification. Mild mitral valve regurgitation. Tricuspid Valve: The tricuspid valve is normal in structure. Tricuspid valve regurgitation is trivial. Aortic Valve: AV is thickened, calcified with mildly restricted motion. Peak and mean gradients through the valve are 22 and 13 mm Hg respectively. The aortic valve is tricuspid. Aortic valve regurgitation is moderate. Aortic regurgitation PHT measures 116 msec. Aortic valve mean gradient measures 11.5 mmHg. Aortic valve peak gradient measures 20.6 mmHg. Aortic valve area, by VTI measures 2.64 cm. Pulmonic Valve: The pulmonic valve was normal in structure. Pulmonic valve regurgitation is not visualized. Aorta: The aortic root and ascending aorta are structurally normal, with no evidence of dilitation. Venous: The inferior vena cava is normal in size with greater than 50% respiratory variability, suggesting right atrial pressure of 3 mmHg. IAS/Shunts: No atrial level shunt detected by color flow Doppler.  LEFT VENTRICLE PLAX 2D LVIDd:         4.10 cm      Diastology LVIDs:         2.90 cm      LV e' medial:    6.64 cm/s LV PW:         1.50 cm      LV E/e' medial:  14.9 LV IVS:        1.50 cm      LV e' lateral:   6.74 cm/s LVOT diam:     2.06 cm      LV E/e' lateral: 14.7 LV SV:  128 LV SV Index:   62 LVOT Area:     3.33 cm  LV Volumes (MOD) LV vol d, MOD A2C: 146.0 ml LV vol d, MOD A4C: 140.0 ml LV vol s, MOD A2C: 47.0 ml LV vol s, MOD A4C: 53.7 ml LV SV MOD A2C:     99.0 ml LV SV MOD A4C:     140.0 ml LV SV MOD BP:      96.2 ml RIGHT  VENTRICLE RV S prime:     13.50 cm/s TAPSE (M-mode): 2.2 cm LEFT ATRIUM             Index        RIGHT ATRIUM           Index LA diam:        3.72 cm 1.81 cm/m   RA Area:     17.00 cm LA Vol (A2C):   59.9 ml 29.09 ml/m  RA Volume:   46.70 ml  22.68 ml/m LA Vol (A4C):   67.7 ml 32.88 ml/m LA Biplane Vol: 65.7 ml 31.91 ml/m  AORTIC VALVE AV Area (Vmax):    2.65 cm AV Area (Vmean):   2.48 cm AV Area (VTI):     2.64 cm AV Vmax:           227.00 cm/s AV Vmean:          157.750 cm/s AV VTI:            0.484 m AV Peak Grad:      20.6 mmHg AV Mean Grad:      11.5 mmHg LVOT Vmax:         180.20 cm/s LVOT Vmean:        117.400 cm/s LVOT VTI:          0.383 m LVOT/AV VTI ratio: 0.79 AI PHT:            116 msec  AORTA Ao Root diam: 3.19 cm Ao Asc diam:  3.33 cm MITRAL VALVE                TRICUSPID VALVE MV Area (PHT): 2.78 cm     TR Peak grad:   22.8 mmHg MV Decel Time: 273 msec     TR Vmax:        239.00 cm/s MV E velocity: 99.20 cm/s MV A velocity: 129.00 cm/s  SHUNTS MV E/A ratio:  0.77         Systemic VTI:  0.38 m                             Systemic Diam: 2.06 cm Vina Gull MD Electronically signed by Vina Gull MD Signature Date/Time: 02/11/2024/12:38:21 PM    Final    DG Chest Portable 1 View Result Date: 02/10/2024 CLINICAL DATA:  Chest pain EXAM: PORTABLE CHEST 1 VIEW COMPARISON:  May 13, 2022.  November 22, 2023. FINDINGS: The heart size and mediastinal contours are within normal limits. Right lung is clear. Nodular opacity is noted in left upper lobe which most likely corresponds to left upper lobe subpleural mass noted on recent CT scan. The visualized skeletal structures are unremarkable. IMPRESSION: Nodular opacity seen in left upper lobe which most likely corresponds to subpleural mass noted on recent CT scan. Electronically Signed   By: Lynwood Landy Raddle M.D.   On: 02/10/2024 17:18     ASSESSMENT/PLAN:  This is a very pleasant 74 year old Caucasian  female with extensive stage (T2b, N3, M1 B)  small cell lung cancer.  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    She underwent a course of systemic chemotherapy with carboplatin  for an AUC of 5 on day 1, etoposide  100 mg/m on days 1, 2, and 3.  She is status post 4 cycles.  This was concurrent with radiation and treated as limited stage small cell lung cancer due to the limited volume of her disease in the left lung.   She started the first cycle of her consolidation treatment with durvalumab  1500 mg IV every 4 weeks on 08/30/2023 status post 6 cycles.  She tolerated her treatment fairly well.   The patient was seen with Dr. Sherrod today.  Dr. Sherrod personally and independently reviewed the scan and discussed results with the patient today.  The scan showed ***.  Dr. Sherrod recommends ***    Labs were reviewed. Recommend that she proceed with cycle #7 today as scheduled.      We will see her back in 4 weeks before undergoing cycle #8    ***Compression fracture of thoracic vertebra Thoracic vertebral compression fracture treated with vertebroplasty. Difficulty lying supine limits certain procedures and exercises. Finds physical therapy beneficial. - Encouraged her to attend her upcoming appointment with her spine specialist (Dr. Malcolm) in January. - Reinforced the importance of physical therapy and encouraged continuation as tolerated.   Her TSH and T4 is pending. ***   ***Persistent sinus drainage and cough likely due to chronic sinusitis and environmental allergies. Self-manages with guaifenesin  and previously improved with doxycycline . - Offered prescription for doxycycline  for sinusitis. - Reviewed current use of over-the-counter guaifenesin  for symptom management.    The patient was advised to call immediately if she has any concerning symptoms in the  interval. The patient voices understanding of current disease status and treatment options and is in agreement with the current care plan. All questions were answered. The patient knows to call the clinic with any problems, questions or concerns. We can certainly see the patient much sooner if necessary    No orders of the defined types were placed in this encounter.    I spent {CHL ONC TIME VISIT - DTPQU:[id number]} counseling the patient face to face. The total time spent in the appointment was {CHL ONC TIME VISIT - DTPQU:[id number]}.  Jameka Ivie L Eriberto Felch, PA-C 02/20/24

## 2024-02-21 ENCOUNTER — Inpatient Hospital Stay

## 2024-02-21 ENCOUNTER — Inpatient Hospital Stay: Admitting: Internal Medicine

## 2024-02-22 ENCOUNTER — Ambulatory Visit (HOSPITAL_COMMUNITY)

## 2024-02-23 ENCOUNTER — Inpatient Hospital Stay: Admitting: Physician Assistant

## 2024-02-23 ENCOUNTER — Inpatient Hospital Stay

## 2024-02-25 ENCOUNTER — Ambulatory Visit (HOSPITAL_COMMUNITY)
Admission: RE | Admit: 2024-02-25 | Discharge: 2024-02-25 | Disposition: A | Source: Ambulatory Visit | Attending: Physician Assistant

## 2024-02-25 DIAGNOSIS — Z9889 Other specified postprocedural states: Secondary | ICD-10-CM | POA: Diagnosis not present

## 2024-02-25 DIAGNOSIS — R911 Solitary pulmonary nodule: Secondary | ICD-10-CM | POA: Insufficient documentation

## 2024-02-25 DIAGNOSIS — K5730 Diverticulosis of large intestine without perforation or abscess without bleeding: Secondary | ICD-10-CM | POA: Insufficient documentation

## 2024-02-25 DIAGNOSIS — C384 Malignant neoplasm of pleura: Secondary | ICD-10-CM | POA: Diagnosis not present

## 2024-02-25 DIAGNOSIS — C3400 Malignant neoplasm of unspecified main bronchus: Secondary | ICD-10-CM | POA: Insufficient documentation

## 2024-02-25 MED ORDER — IOHEXOL 300 MG/ML  SOLN
100.0000 mL | Freq: Once | INTRAMUSCULAR | Status: AC | PRN
  Administered 2024-02-25: 100 mL via INTRAVENOUS

## 2024-02-28 ENCOUNTER — Inpatient Hospital Stay

## 2024-02-28 ENCOUNTER — Inpatient Hospital Stay: Attending: Hematology | Admitting: Internal Medicine

## 2024-02-28 DIAGNOSIS — C3400 Malignant neoplasm of unspecified main bronchus: Secondary | ICD-10-CM

## 2024-02-29 ENCOUNTER — Telehealth: Payer: Self-pay

## 2024-03-04 ENCOUNTER — Inpatient Hospital Stay

## 2024-03-22 ENCOUNTER — Inpatient Hospital Stay

## 2024-03-22 ENCOUNTER — Inpatient Hospital Stay: Admitting: Internal Medicine

## 2024-04-11 ENCOUNTER — Ambulatory Visit: Admitting: Internal Medicine

## 2024-04-19 ENCOUNTER — Inpatient Hospital Stay

## 2024-04-19 ENCOUNTER — Inpatient Hospital Stay: Admitting: Physician Assistant

## 2024-09-06 ENCOUNTER — Ambulatory Visit
